# Patient Record
Sex: Male | Born: 1945
Health system: Southern US, Community
[De-identification: ages and names within clinical notes are randomized; demographics above are authoritative.]

## PROBLEM LIST (undated history)

## (undated) DIAGNOSIS — H269 Unspecified cataract: Secondary | ICD-10-CM

## (undated) DIAGNOSIS — E785 Hyperlipidemia, unspecified: Secondary | ICD-10-CM

## (undated) DIAGNOSIS — C61 Malignant neoplasm of prostate: Secondary | ICD-10-CM

## (undated) DIAGNOSIS — E119 Type 2 diabetes mellitus without complications: Secondary | ICD-10-CM

## (undated) DIAGNOSIS — D649 Anemia, unspecified: Secondary | ICD-10-CM

## (undated) DIAGNOSIS — R7989 Other specified abnormal findings of blood chemistry: Secondary | ICD-10-CM

## (undated) DIAGNOSIS — N400 Enlarged prostate without lower urinary tract symptoms: Secondary | ICD-10-CM

## (undated) DIAGNOSIS — I10 Essential (primary) hypertension: Secondary | ICD-10-CM

## (undated) HISTORY — DX: Other specified abnormal findings of blood chemistry: R79.89

## (undated) HISTORY — PX: COLONOSCOPY: SHX174

## (undated) HISTORY — DX: Malignant neoplasm of prostate: C61

## (undated) HISTORY — DX: Hyperlipidemia, unspecified: E78.5

## (undated) HISTORY — DX: Type 2 diabetes mellitus without complications: E11.9

## (undated) HISTORY — DX: Benign prostatic hyperplasia without lower urinary tract symptoms: N40.0

## (undated) HISTORY — DX: Essential (primary) hypertension: I10

## (undated) HISTORY — DX: Anemia, unspecified: D64.9

## (undated) HISTORY — DX: Unspecified cataract: H26.9

---

## 1999-02-06 ENCOUNTER — Inpatient Hospital Stay (HOSPITAL_COMMUNITY): Admission: EM | Admit: 1999-02-06 | Discharge: 1999-02-08 | Payer: Self-pay | Admitting: Emergency Medicine

## 2006-06-30 DIAGNOSIS — C61 Malignant neoplasm of prostate: Secondary | ICD-10-CM

## 2006-06-30 HISTORY — PX: PROSTATE SURGERY: SHX751

## 2006-06-30 HISTORY — DX: Malignant neoplasm of prostate: C61

## 2007-05-04 ENCOUNTER — Ambulatory Visit: Payer: Self-pay | Admitting: Cardiology

## 2007-05-05 ENCOUNTER — Ambulatory Visit: Payer: Self-pay | Admitting: Cardiology

## 2007-05-05 ENCOUNTER — Ambulatory Visit (HOSPITAL_COMMUNITY): Admission: RE | Admit: 2007-05-05 | Discharge: 2007-05-05 | Payer: Self-pay | Admitting: Cardiology

## 2007-05-13 ENCOUNTER — Ambulatory Visit: Payer: Self-pay | Admitting: Cardiology

## 2008-08-15 ENCOUNTER — Ambulatory Visit: Admission: RE | Admit: 2008-08-15 | Discharge: 2008-10-23 | Payer: Self-pay | Admitting: Radiation Oncology

## 2008-10-23 ENCOUNTER — Ambulatory Visit: Admission: RE | Admit: 2008-10-23 | Discharge: 2008-12-05 | Payer: Self-pay | Admitting: Radiation Oncology

## 2008-12-20 ENCOUNTER — Ambulatory Visit: Admission: RE | Admit: 2008-12-20 | Discharge: 2009-02-12 | Payer: Self-pay | Admitting: Radiation Oncology

## 2009-02-19 ENCOUNTER — Ambulatory Visit (HOSPITAL_BASED_OUTPATIENT_CLINIC_OR_DEPARTMENT_OTHER): Admission: RE | Admit: 2009-02-19 | Discharge: 2009-02-19 | Payer: Self-pay | Admitting: Urology

## 2009-03-09 ENCOUNTER — Ambulatory Visit: Admission: RE | Admit: 2009-03-09 | Discharge: 2009-03-27 | Payer: Self-pay | Admitting: Radiation Oncology

## 2010-01-23 ENCOUNTER — Ambulatory Visit: Admission: RE | Admit: 2010-01-23 | Discharge: 2010-01-23 | Payer: Self-pay | Admitting: Radiation Oncology

## 2010-09-14 ENCOUNTER — Encounter: Payer: Self-pay | Admitting: *Deleted

## 2010-09-14 DIAGNOSIS — E119 Type 2 diabetes mellitus without complications: Secondary | ICD-10-CM | POA: Insufficient documentation

## 2010-09-14 DIAGNOSIS — E1169 Type 2 diabetes mellitus with other specified complication: Secondary | ICD-10-CM | POA: Insufficient documentation

## 2010-09-14 DIAGNOSIS — Z720 Tobacco use: Secondary | ICD-10-CM

## 2010-09-14 DIAGNOSIS — I10 Essential (primary) hypertension: Secondary | ICD-10-CM

## 2010-09-14 DIAGNOSIS — E785 Hyperlipidemia, unspecified: Secondary | ICD-10-CM

## 2010-09-14 DIAGNOSIS — E1165 Type 2 diabetes mellitus with hyperglycemia: Secondary | ICD-10-CM | POA: Insufficient documentation

## 2010-09-14 DIAGNOSIS — N400 Enlarged prostate without lower urinary tract symptoms: Secondary | ICD-10-CM

## 2010-10-05 LAB — COMPREHENSIVE METABOLIC PANEL
ALT: 31 U/L (ref 0–53)
AST: 37 U/L (ref 0–37)
Albumin: 3.4 g/dL — ABNORMAL LOW (ref 3.5–5.2)
Alkaline Phosphatase: 73 U/L (ref 39–117)
CO2: 29 mEq/L (ref 19–32)
Chloride: 107 mEq/L (ref 96–112)
GFR calc Af Amer: >60 mL/min (ref >60)
Potassium: 4.5 mEq/L (ref 3.5–5.1)
Sodium: 141 mEq/L (ref 135–145)
Total Bilirubin: 0.6 mg/dL (ref 0.3–1.2)

## 2010-10-05 LAB — CBC
Platelets: 193 10*3/uL (ref 150–400)
RBC: 3.72 MIL/uL — ABNORMAL LOW (ref 4.22–5.81)
WBC: 5.1 10*3/uL (ref 4.0–10.5)

## 2010-10-05 LAB — POCT I-STAT 4, (NA,K, GLUC, HGB,HCT)
Glucose, Bld: 104 mg/dL — ABNORMAL HIGH (ref 70–99)
HCT: 36 % — ABNORMAL LOW (ref 39.0–52.0)
Hemoglobin: 12.2 g/dL — ABNORMAL LOW (ref 13.0–17.0)

## 2010-10-05 LAB — APTT: aPTT: 27 seconds (ref 24–37)

## 2010-11-12 NOTE — Letter (Signed)
May 04, 2007    Lindaann Pascal, PA-C  [facility]  [address]  [place], [place] [address]   RE:  Andrew [name], Andrew [name]  MRN:  [id number]  /  DOB:  [birth year]   Dear Lorin Picket:   It was my pleasure evaluating this patient in consultation today in the  office at your request.  As you know, this nice gentleman has had a 10-  year history of hypertension that has apparently been fairly well  treated.  He has a longstanding history of cigarette smoking with total  consumption of perhaps 60-pack-years.  Otherwise, he has been generally  healthy.  He is unaware of his cholesterol status.  He has not had  diabetes.  He has only been hospitalized once for back pain, and did not  require surgery.   ALLERGIES:  No known drug allergies.   CURRENT MEDICATIONS:  Diovan HCT 160/25 mg daily.   SOCIAL HISTORY:  Disabled, but enjoys gardening.  Married and lives  locally with no children.   FAMILY HISTORY:  Father died at age 21 with chronic lung disease; mother  at age 75 with CVA.  He has five siblings, one deceased due to CVA and  one due to chronic lung disease.   REVIEW OF SYSTEMS:  Notable for the need for corrective lenses for  reading, some impairment in hearing, partial upper dentures, chronic  left shoulder pain, continuing back and right hip pain with disability.  All other systems reviewed and are negative.   PHYSICAL EXAMINATION:  GENERAL:  Pleasant gentleman in no acute  distress.  VITAL SIGNS:  Weight 217, blood pressure 145/95, heart rate 105 and  regular, respirations 16.  HEENT:  EOM's full.  Normal lids and conjunctivae.  Normal oral mucosa.  NECK:  No jugular venous distention.  Normal carotid upstrokes without  bruits.  LUNGS:  Clear.  HEART:  Normal first and second heart sounds; modest systolic ejection  murmur at the left sternal border and base.  Normal PMI.  ABDOMEN:  Soft and nontender.  No bruits.  Normal bowel sounds.  No  masses nor  organomegaly.  EXTREMITIES:  Normal distal pulses; no edema.  NEUROMUSCULAR:  Symmetric strength and tone.  Normal cranial nerves.  PSYCHIATRIC:  Alert and oriented.  Normal affect.  SKIN:  No significant lesions.   EKG; sinus tachycardia, left atrial abnormality, probable LVH.  Slight  inferior J-point elevation.  No previous tracing for comparison.   IMPRESSION:  The patient enjoys generally good health.  He does have  some cardiovascular risk factors, but no symptoms to suggest the  presence of CAD.  We will check a lipid profile to determine if  pharmacologic therapy is warranted.   Blood pressure control is suboptimal at this visit, but the patient  seems somewhat anxious with a substantial tachycardia.  He will obtain  home blood pressures to determine if additional therapy is warranted.   He has modest systolic ejection murmur.  This probably is a flow murmur.  Due to the presence of LVH on his EKG and the murmur, an echocardiogram  will be obtained.   The patient has a longstanding history of tobacco use without much in  the way of symptoms.  I recommended that he seriously consider  discontinuing tobacco use.  I will reassess this nice gentleman after  the above testing has been completed.  Thanks so much for sending him to  me.    Sincerely,  Gerrit Friends. Dietrich Pates, MD, [facility]  Electronically Signed    RMR/MedQ  DD: 05/04/2007  DT: 05/05/2007  Job #: [id number]

## 2010-11-12 NOTE — Assessment & Plan Note (Signed)
[facility]                       [place] CARDIOLOGY OFFICE NOTE   Andrew [name], Andrew [name]                         MRN:          [id number]  DATE:05/13/2007                            DOB:          Nov 14, 1945    [name] returns to the office for continued assessment and treatment  of hypertension, heart murmur, and tobacco use.  Since his last visit,  he has tapered cigarette use to some extent, but is still smoking nearly  1 pack per day.  He does not appear motivated to quit entirely at the  present time.  An echocardiogram was performed due to his sytolic murmur  and hypertension.  This showed no valvular abnormalities and very mild  left ventricular hypertrophy.  He monitored blood pressure at home.  Approximately 70% of his values are too high on the systolic side.  His  only medication continues to be Diovan/hydrochlorothiazide.   PHYSICAL EXAMINATION:  On exam, a pleasant gentleman in no acute  distress walking with a cane due to chronic back and right hip problems.  The weight is 219, two pounds more than at his last visit.  Blood  pressure 135/80, heart rate 90 and regular, respirations 16.  NECK:  No jugular venous distension, no carotid bruits.  LUNGS:  Clear.  CARDIAC:  Normal 1st and 2nd heart sounds, 4th heart sound present.  ABDOMEN:  Soft and nontender, no organomegaly.  EXTREMITIES:  No edema.   IMPRESSION:  [name] is doing generally well.  He requires additional  antihypertensive therapy.  We will start metoprolol on a dose of 25 mg  b.i.d.  He will continue to monitor pressures at home and return to your  office.  I will see him again in 6 months.  I offered him a prescription  for pharmacologic therapy whenever he decides that he is serious about  discontinuing tobacco use.     Gerrit Friends. Dietrich Pates, MD, [facility]  Electronically Signed    RMR/MedQ  DD: 05/13/2007  DT: 05/14/2007  Job #: [id number]

## 2010-11-12 NOTE — Op Note (Signed)
NAME:  Andrew [name], Andrew [name]                  ACCOUNT NO.:  [id number]   MEDICAL RECORD NO.:  [id number]          PATIENT TYPE:  AMB   LOCATION:  [facility]                         FACILITY:  [facility]   PHYSICIAN:  Lindaann Slough, M.D.  DATE OF BIRTH:  [birth year]   DATE OF PROCEDURE:  02/19/2009  DATE OF DISCHARGE:                               OPERATIVE REPORT   PREOPERATIVE DIAGNOSIS:  Adenocarcinoma of prostate, stage T2b   POSTOPERATIVE DIAGNOSIS:  Adenocarcinoma of prostate, stage T2b   PROCEDURE:  I-125 seeds implantation.  Cystoscopy.   SURGEON:  Danae Chen, M.D., Maryln Gottron, M.D.   ANESTHESIA:  General.   INDICATIONS:  The patient is a 65 years old male who was diagnosed by  Dr. Jerre Simon to have prostate cancer, Gleason score 7 and PSA of 16.6.  He  was then seen by Dr. Dayton Scrape and was advised to have combination  radiation treatment.  He is also on neoadjuvant hormonal therapy.  He  has already received external beam.  He is scheduled today for  brachytherapy.   The patient was identified by his wristband and proper time-out was  taken.   DESCRIPTION OF PROCEDURE:  Under general anesthesia, he was prepped and  draped and placed in the dorsal lithotomy position.  Ultrasound planning  was done by Dr. Dayton Scrape and when the planning was completed under  ultrasound guidance and with the Nucletron, a total of 61 seeds were  implanted in the prostate through 26 needles.  There appears to be good  seed distribution on  fluoroscopy.  The total apparent activity 17.6400  mCi.   The Foley catheter that was inserted in the bladder was then removed.  A  flexible cystoscope was passed in the bladder.  The urethra is normal.  One seed appeared to be close to the urethral mucosa but is not free in  the in the prostatic urethra.  There was one seed in the bladder.  The  bladder mucosa is normal.  There is no stone or tumor in the bladder.  The ureteral orifices are in normal position and shape with  clear  efflux.  The ssed in the bladder was  removed with the grasping forceps.  60 seeds were left in the prostate.  The cystoscope was then removed.  A #16 Foley catheter was then  reinserted in the bladder.   The patient tolerated the procedure well and left the OR in satisfactory  condition to post anesthesia care unit.      Lindaann Slough, M.D.  Electronically Signed     MN/MEDQ  D:  02/19/2009  T:  02/19/2009  Job:  [id number]   cc:   Maryln Gottron, M.D.  Fax: [phone]   Ky Barban, M.D.  Fax: [phone]

## 2010-11-12 NOTE — Procedures (Signed)
NAMEERASTUS, BARTOLOMEI                  ACCOUNT NO.:  [id number]   MEDICAL RECORD NO.:  [id number]          PATIENT TYPE:  OUT   LOCATION:  [facility]                           FACILITY:  [facility]   PHYSICIAN:  Gerrit Friends. Dietrich Pates, MD, FACCDATE OF BIRTH:  Mar 18, 1946   DATE OF PROCEDURE:  05/05/2007  DATE OF DISCHARGE:                                ECHOCARDIOGRAM   REFERRING:  Gerrit Friends. Dietrich Pates, M.D.   CLINICAL DATA:  A 65 year old gentleman with heart murmur and  hypertension.   M-MODE:  Aorta 3.6, left atrium 3.5, septum 1.4, posterior wall 1.4, LV  diastole 3.7, LV systole 2.9.   1. Technically adequate echocardiographic study.  2. Normal left atrium, right atrium and right ventricle.  3. Normal aortic, mitral, tricuspid and pulmonic valves; mild mitral      annular calcification; physiologic tricuspid regurgitation.  4. Normal proximal pulmonary artery.  5. Normal proximal ascending aorta; mild calcification of the wall.  6. Normal left ventricular size; borderline hypertrophy; normal      regional and global LV systolic function.  7. Normal IVC.      Gerrit Friends. Dietrich Pates, MD, [facility]  Electronically Signed     RMR/MEDQ  D:  05/05/2007  T:  05/06/2007  Job:  [id number]

## 2012-09-20 ENCOUNTER — Telehealth: Payer: Self-pay | Admitting: Physician Assistant

## 2012-09-20 MED ORDER — SITAGLIPTIN PHOS-METFORMIN HCL 50-500 MG PO TABS: 1.0000 | ORAL_TABLET | Freq: Two times a day (BID) | ORAL | Status: DC

## 2012-09-20 NOTE — Telephone Encounter (Signed)
Jaumet  .50mg /500mg  Samples if we have them  Call please

## 2012-09-20 NOTE — Telephone Encounter (Signed)
Pt.notified

## 2012-10-19 ENCOUNTER — Telehealth: Payer: Self-pay | Admitting: Nurse Practitioner

## 2012-10-19 NOTE — Telephone Encounter (Signed)
Pt aware we are out of samples. 

## 2012-10-25 ENCOUNTER — Telehealth: Payer: Self-pay | Admitting: Nurse Practitioner

## 2012-10-25 NOTE — Telephone Encounter (Signed)
Patient aware.

## 2012-10-25 NOTE — Telephone Encounter (Signed)
OK for Janumet samples

## 2012-10-25 NOTE — Telephone Encounter (Signed)
Please advise 

## 2012-10-25 NOTE — Telephone Encounter (Signed)
No samples 

## 2012-10-28 ENCOUNTER — Telehealth: Payer: Self-pay | Admitting: Physician Assistant

## 2012-10-28 DIAGNOSIS — E119 Type 2 diabetes mellitus without complications: Secondary | ICD-10-CM

## 2012-10-28 MED ORDER — SITAGLIPTIN PHOS-METFORMIN HCL 50-500 MG PO TABS: 1.0000 | ORAL_TABLET | Freq: Two times a day (BID) | ORAL | Status: DC

## 2012-10-28 NOTE — Telephone Encounter (Signed)
Pt aware samples at front desk 

## 2012-11-16 ENCOUNTER — Ambulatory Visit (INDEPENDENT_AMBULATORY_CARE_PROVIDER_SITE_OTHER): Payer: Medicare Other

## 2012-11-16 ENCOUNTER — Encounter: Payer: Self-pay | Admitting: Family Medicine

## 2012-11-16 ENCOUNTER — Ambulatory Visit (INDEPENDENT_AMBULATORY_CARE_PROVIDER_SITE_OTHER): Payer: Medicare Other | Admitting: Family Medicine

## 2012-11-16 VITALS — BP 141/74 | HR 65 | Temp 97.1°F | Ht 67.0 in | Wt 232.8 lb

## 2012-11-16 DIAGNOSIS — F17200 Nicotine dependence, unspecified, uncomplicated: Secondary | ICD-10-CM

## 2012-11-16 DIAGNOSIS — R350 Frequency of micturition: Secondary | ICD-10-CM

## 2012-11-16 DIAGNOSIS — Z720 Tobacco use: Secondary | ICD-10-CM

## 2012-11-16 DIAGNOSIS — R5383 Other fatigue: Secondary | ICD-10-CM

## 2012-11-16 DIAGNOSIS — E119 Type 2 diabetes mellitus without complications: Secondary | ICD-10-CM

## 2012-11-16 DIAGNOSIS — E785 Hyperlipidemia, unspecified: Secondary | ICD-10-CM

## 2012-11-16 DIAGNOSIS — R5381 Other malaise: Secondary | ICD-10-CM

## 2012-11-16 DIAGNOSIS — I10 Essential (primary) hypertension: Secondary | ICD-10-CM

## 2012-11-16 DIAGNOSIS — R0602 Shortness of breath: Secondary | ICD-10-CM

## 2012-11-16 DIAGNOSIS — N400 Enlarged prostate without lower urinary tract symptoms: Secondary | ICD-10-CM

## 2012-11-16 DIAGNOSIS — Z23 Encounter for immunization: Secondary | ICD-10-CM

## 2012-11-16 DIAGNOSIS — E559 Vitamin D deficiency, unspecified: Secondary | ICD-10-CM

## 2012-11-16 NOTE — Progress Notes (Signed)
Subjective:    Patient ID: Andrew [name], male    DOB: [birth year], 67 y.o.   MRN: [id number]  HPI This patient presents for recheck of multiple medical problems. No one accompanies the patient today.  Patient Active Problem List   Diagnosis Date Noted  . Diabetes mellitus 09/14/2010  . BPH (benign prostatic hyperplasia) 09/14/2010  . HTN (hypertension) 09/14/2010  . Hyperlipidemia 09/14/2010  . Tobacco abuse 09/14/2010    In addition, he is having some allergic rhinitis issues urinary frequency and back pain. Patient is retired from working in the [employer]. He stays at home a lot and works in the yard. He brings in blood sugars today and fasting blood sugars seem to run in the 140 range and before dinner average blood sugars seem to be in the 140 range. He does not do home blood pressure. He indicates he has no burning with voiding. The patient sees a urologist in [place] about every 6 month. He sees the ophthalmologist yearly and was last checked about a year ago. See outside blood sugars for review.  The allergies, current medications, past medical history, surgical history, family and social history are reviewed.  Immunizations reviewed.  Health maintenance reviewed.  The following items are outstanding: Multiple items are past due and we will start working on these today.      Review of Systems  Constitutional: Positive for fatigue.  HENT: Positive for postnasal drip.   Eyes: Negative.   Respiratory: Negative.   Cardiovascular: Negative.   Gastrointestinal: Negative.   Genitourinary: Positive for frequency.  Musculoskeletal: Positive for back pain (LBP).  Skin: Negative.   Allergic/Immunologic: Positive for environmental allergies (seasonal).  Neurological: Negative.   Psychiatric/Behavioral: Positive for sleep disturbance (occasional).       Objective:   Physical Exam BP 141/74  Pulse 65  Temp(Src) 97.1 F (36.2 C) (Oral)  Ht 5\' 7"  (1.702 m)  Wt 232  lb 12.8 oz (105.597 kg)  BMI 36.45 kg/m2  The patient appeared well nourished and normally developed, alert and oriented to time and place. He was pleasant and laid back. Speech, behavior and judgement appear normal. Vital signs as documented.  Head exam is unremarkable. No scleral icterus or pallor noted. TMs were normal throat was normal.  Neck is without jugular venous distension, thyromegally, or carotid bruits. Carotid upstrokes are brisk bilaterally. No cervical adenopathy. Lungs are clear anteriorly and posteriorly to auscultation. Normal respiratory effort. Cardiac exam reveals regular rate and rhythm at 72 per minute. First and second heart sounds normal.  No murmurs, rubs or gallops.  Abdominal exam reveals normal bowl sounds, obesity, no masses, no organomegaly and no aortic enlargement. No inguinal adenopathy. Extremities are nonedematous and both femoral and pedal pulses are normal. Skin without pallor or jaundice.  Warm and dry, without rash. Neurologic exam reveals normal deep tendon reflexes and normal sensation. A. diabetic foot exam was done.  [facility] reading (PRIMARY) by  Dr. Christell Constant: CXR: Bronchitic changes                                        Assessment & Plan:  1. Diabetes - POCT glycosylated hemoglobin (Hb A1C); Standing - POCT UA - Microalbumin; Standing - BASIC METABOLIC PANEL WITH GFR; Standing - Microalbumin, urine; Standing  2. Hypertension - BASIC METABOLIC PANEL WITH GFR; Standing  3. Hyperlipidemia - Hepatic function panel; Standing - NMR Lipoprofile  with Lipids; Standing  4. Fatigue - POCT CBC; Standing - Thyroid Panel With TSH  5. BPH (benign prostatic hypertrophy) He is followed every 6 months by a urologist  6. Vitamin D deficiency - Vitamin D 25 hydroxy; Standing  7. Tobacco abuse - DG Chest 2 View; Future  8. Urinary frequency - POCT UA - Microscopic Only - POCT urinalysis dipstick  9. Shortness of breath -CXR  Patient  Instructions  Return to clinic for lab work For now continue current medication Continue therapeutic lifestyle changes

## 2012-11-16 NOTE — Patient Instructions (Addendum)
Return to clinic for lab work For now continue current medication Continue therapeutic lifestyle changes

## 2012-11-18 ENCOUNTER — Ambulatory Visit (INDEPENDENT_AMBULATORY_CARE_PROVIDER_SITE_OTHER): Payer: Medicare Other | Admitting: Family Medicine

## 2012-11-18 DIAGNOSIS — E785 Hyperlipidemia, unspecified: Secondary | ICD-10-CM

## 2012-11-18 DIAGNOSIS — E559 Vitamin D deficiency, unspecified: Secondary | ICD-10-CM

## 2012-11-18 DIAGNOSIS — E119 Type 2 diabetes mellitus without complications: Secondary | ICD-10-CM

## 2012-11-18 DIAGNOSIS — I10 Essential (primary) hypertension: Secondary | ICD-10-CM

## 2012-11-18 DIAGNOSIS — R5383 Other fatigue: Secondary | ICD-10-CM

## 2012-11-18 LAB — POCT CBC
Granulocyte percent: 62 %G (ref 37–80)
HCT, POC: 36.5 % — AB (ref 43.5–53.7)
MCH, POC: 31.1 pg (ref 27–31.2)
MCV: 90.2 fL (ref 80–97)
RDW, POC: 13.5 %
WBC: 5.6 10*3/uL (ref 4.6–10.2)

## 2012-11-18 LAB — HEPATIC FUNCTION PANEL
AST: 45 U/L — ABNORMAL HIGH (ref 0–37)
Alkaline Phosphatase: 86 U/L (ref 39–117)
Bilirubin, Direct: 0.1 mg/dL (ref 0.0–0.3)
Total Bilirubin: 0.4 mg/dL (ref 0.3–1.2)

## 2012-11-18 LAB — BASIC METABOLIC PANEL WITH GFR
CO2: 27 mEq/L (ref 19–32)
Chloride: 104 mEq/L (ref 96–112)
Creat: 1.16 mg/dL (ref 0.50–1.35)
Sodium: 141 mEq/L (ref 135–145)

## 2012-11-18 LAB — POCT UA - MICROALBUMIN: Microalbumin Ur, POC: POSITIVE mg/dL

## 2012-11-19 LAB — MICROALBUMIN, URINE: Microalb, Ur: 0.73 mg/dL (ref 0.00–1.89)

## 2012-11-23 LAB — NMR LIPOPROFILE WITH LIPIDS
HDL Particle Number: 20.2 umol/L — ABNORMAL LOW (ref >=30.5)
HDL-C: 27 mg/dL — ABNORMAL LOW (ref >=40)
Large HDL-P: <1.3 umol/L — ABNORMAL LOW (ref >=4.8)
Triglycerides: 161 mg/dL — ABNORMAL HIGH (ref <150)

## 2012-12-07 ENCOUNTER — Telehealth: Payer: Self-pay | Admitting: Family Medicine

## 2012-12-07 NOTE — Telephone Encounter (Signed)
appt scheduled with [facility] Pt notified

## 2012-12-17 NOTE — Progress Notes (Signed)
Patient ID: Andrew [name], male   DOB: 13-Sep-1945, 67 y.o.   MRN: [id number] Error lab only

## 2013-01-03 ENCOUNTER — Encounter: Payer: Self-pay | Admitting: Pharmacist

## 2013-01-03 ENCOUNTER — Ambulatory Visit (INDEPENDENT_AMBULATORY_CARE_PROVIDER_SITE_OTHER): Payer: Medicare Other | Admitting: Pharmacist

## 2013-01-03 VITALS — BP 152/82 | HR 70 | Ht 67.0 in | Wt 234.0 lb

## 2013-01-03 DIAGNOSIS — F17200 Nicotine dependence, unspecified, uncomplicated: Secondary | ICD-10-CM

## 2013-01-03 DIAGNOSIS — Z720 Tobacco use: Secondary | ICD-10-CM

## 2013-01-03 DIAGNOSIS — I10 Essential (primary) hypertension: Secondary | ICD-10-CM

## 2013-01-03 DIAGNOSIS — E119 Type 2 diabetes mellitus without complications: Secondary | ICD-10-CM

## 2013-01-03 DIAGNOSIS — E785 Hyperlipidemia, unspecified: Secondary | ICD-10-CM

## 2013-01-03 MED ORDER — ATORVASTATIN CALCIUM 40 MG PO TABS: 40.0000 mg | ORAL_TABLET | Freq: Every day | ORAL | Status: DC

## 2013-01-03 MED ORDER — LISINOPRIL-HYDROCHLOROTHIAZIDE 20-25 MG PO TABS: 1.0000 | ORAL_TABLET | Freq: Every day | ORAL | Status: DC

## 2013-01-03 MED ORDER — VARENICLINE TARTRATE 0.5 MG X 11 & 1 MG X 42 PO MISC: ORAL | Status: DC

## 2013-01-03 NOTE — Progress Notes (Addendum)
Diabetes Follow-Up Visit Chief Complaint:   Chief Complaint  Patient presents with  . Diabetes  . Hypertension  . Hyperlipidemia     Filed Vitals:   01/03/13 1308  BP: 152/82  Pulse: 70    HPI:  Diabetes  - currently taking Janumet 50/500mg  1 tablet bid with good results.  Last A1C was 7.0%.  HGB readings in 90 to 120 range.   Hyperlipidemia - patient currently taking pravastatin 40mg  daily.  Last LDL-P was 1656 and Tg 161.  No following any special diet for lipid or BG control  Hypertension - patient has been out of diovan HCT 160/12.5mg  for 2 to 3 weeks because of cost.  BP today was elevated.  Tobacco abuse - patient smokes about 1ppd.  Interested in quitting.  Triggers include - morning coffee and after meals.  Low HBG - last CBC showed slightly low HGB.  Patient has been given FOBT but said he put in the cabinet and forgot about it.    Exam  Polyuria:  negative  Polydipsia:  negative Polyphagia:  negative  BMI:  Body mass index is 36.64 kg/(m^2).    General Appearance:  obese Mood/Affect:  normal   Low fat/carbohydrate diet?  No Nicotine Abuse?  Yes Medication Compliance?  No Exercise?  No Alcohol Abuse?  No    Lab Results  Component Value Date   HGBA1C 7.0% 11/18/2012    Lab Results  Component Value Date   MICROALBUR 0.73 11/18/2012    Lab Results  Component Value Date   LDLCALC 85 11/18/2012   TRIG 161* 11/18/2012    LDL-P was 1656 (elevated)   Assessment:  1.  Diabetes.  controlled 2.  Blood Pressure.  Uncontrolled - complicated by cost of medication/poor compliance 3.  Lipids.  uncontrolled 4.  Dental Care.  No dental insurance - has partial plate 5.  Eye Care/Exam.  Last exam over 1 year ago  Recommendations: 1.  Medication recommendations at this time are as follows:    Change diovan HCT to lisinopril HCT 20/25mg  1 tablet by mouth each morning  Change pravastatin to atorvastatin 40mg  1 tablet daily  Start chantix starter pack for smoking  cessation - discussed with wife and patient side effects for monitor for (especially any changes in mood/mental status) 2.  Reviewed HBG goals:  Fasting 80-130 and 1-2 hour post prandial <180.  Patient is instructed to check BG 1 times per day.    3.  BP goal < 140/80. 4.  LDL goal of < 100, HDL > 40 and TG < 150. 5.  Eye Exam yearly and Dental Exam every 6 months. 6.  Dietary recommendations:  Discussed limiting high CHO containing foods - breads, pasta, potatoes and processed foods.   Increase lean proteins, fresh fruits and non starchy vegetables 7.  Physical Activity recommendations:  Encouraged to move more 8.  Identified several smoking triggers and came up with alternative behaviors with patient to either avoid or deal with triggers.   9.  Return to clinic in 4-6 wks   Time spent counseling patient:  45 minutes  Referring provider:  Lady Deutscher, PharmD, CPP       Also reminded patient about the importance of FOBT.  He will complete in the next week and return to office

## 2013-01-06 ENCOUNTER — Other Ambulatory Visit (INDEPENDENT_AMBULATORY_CARE_PROVIDER_SITE_OTHER): Payer: Medicare Other

## 2013-01-06 DIAGNOSIS — Z1212 Encounter for screening for malignant neoplasm of rectum: Secondary | ICD-10-CM

## 2013-01-07 LAB — FECAL OCCULT BLOOD, IMMUNOCHEMICAL: Fecal Occult Blood: NEGATIVE

## 2013-01-13 ENCOUNTER — Telehealth: Payer: Self-pay | Admitting: Family Medicine

## 2013-01-13 NOTE — Telephone Encounter (Signed)
Samples up front patient aware  

## 2013-02-16 ENCOUNTER — Ambulatory Visit (INDEPENDENT_AMBULATORY_CARE_PROVIDER_SITE_OTHER): Payer: Medicare Other | Admitting: Family Medicine

## 2013-02-16 ENCOUNTER — Encounter: Payer: Self-pay | Admitting: Family Medicine

## 2013-02-16 VITALS — BP 123/69 | HR 69 | Temp 97.7°F | Ht 67.0 in | Wt 231.2 lb

## 2013-02-16 DIAGNOSIS — E119 Type 2 diabetes mellitus without complications: Secondary | ICD-10-CM

## 2013-02-16 DIAGNOSIS — F17200 Nicotine dependence, unspecified, uncomplicated: Secondary | ICD-10-CM

## 2013-02-16 DIAGNOSIS — N400 Enlarged prostate without lower urinary tract symptoms: Secondary | ICD-10-CM

## 2013-02-16 DIAGNOSIS — R350 Frequency of micturition: Secondary | ICD-10-CM

## 2013-02-16 DIAGNOSIS — E785 Hyperlipidemia, unspecified: Secondary | ICD-10-CM

## 2013-02-16 DIAGNOSIS — I10 Essential (primary) hypertension: Secondary | ICD-10-CM

## 2013-02-16 DIAGNOSIS — Z720 Tobacco use: Secondary | ICD-10-CM

## 2013-02-16 LAB — POCT UA - MICROALBUMIN: Microalbumin Ur, POC: POSITIVE mg/L

## 2013-02-16 NOTE — Patient Instructions (Addendum)
Fall precautions discussed Continue current meds and therapeutic lifestyle changes Return to clinic in September or October for a flu shot Schedule routine eye exam Reduce and stop smoking Watch sodium intake Please make an appointment with a podiatrist to work on feet

## 2013-02-16 NOTE — Progress Notes (Signed)
  Subjective:    Patient ID: Andrew [name], male    DOB: 1945/11/15, 67 y.o.   MRN: [id number]  HPI Patient returns to clinic today for followup and management of chronic medical problems. These include diabetes mellitus type 2, hyperlipidemia, and hypertension. He also sees Dr. Jerre Simon for his BPH. Patient brings in his home blood sugar readings for review and these will be scanned into the record. The patient is still smoking and admits that he does not exercise as much as possible or watch his diet as closely as possible.   Review of Systems  Constitutional: Positive for fatigue (some).  HENT: Positive for sneezing (intermitent) and postnasal drip (slight). Negative for ear pain, congestion, sore throat and sinus pressure.   Eyes: Negative.  Negative for pain, discharge, redness, itching and visual disturbance.  Respiratory: Positive for cough (occasional). Negative for choking, chest tightness and wheezing. Shortness of breath: with exertion.   Cardiovascular: Negative.  Negative for chest pain, palpitations and leg swelling.  Gastrointestinal: Negative for nausea, vomiting, abdominal pain, diarrhea, constipation and blood in stool.  Endocrine: Negative.  Negative for cold intolerance, heat intolerance, polydipsia, polyphagia and polyuria.  Genitourinary: Positive for frequency. Negative for dysuria, hematuria and testicular pain.  Musculoskeletal: Positive for back pain (LBP). Negative for arthralgias.  Skin: Negative.  Negative for color change, pallor, rash and wound.  Allergic/Immunologic: Positive for environmental allergies.  Neurological: Negative for dizziness, tremors, light-headedness, numbness and headaches.  Hematological: Negative.   Psychiatric/Behavioral: Positive for sleep disturbance (occasional). Negative for confusion and agitation. The patient is not nervous/anxious.        Objective:   Physical Exam BP 123/69  Pulse 69  Temp(Src) 97.7 F (36.5 C) (Oral)  Ht 5\' 7"   (1.702 m)  Wt 231 lb 3.2 oz (104.872 kg)  BMI 36.2 kg/m2  The patient appeared well nourished but overweight and normally developed, alert and oriented to time and place. Speech, behavior and judgement appear normal. Vital signs as documented.  Head exam is unremarkable. No scleral icterus or pallor noted. There is some nasal congestion bilaterally. Mouth throat ear canals and eardrums were normal. There was minimal cerumen bilaterally.  Neck is without jugular venous distension, thyromegally, or carotid bruits. Carotid upstrokes are brisk bilaterally. No cervical adenopathy. Lungs are clear anteriorly and posteriorly to auscultation. Normal respiratory effort. Cardiac exam reveals regular rate and rhythm at 60 per minute. First and second heart sounds normal.  No murmurs, rubs or gallops.  Abdominal exam reveals obesity, normal bowl sounds, no masses, no organomegaly and no aortic enlargement. No inguinal adenopathy. There is no abdominal tenderness. Extremities are nonedematous and both femoral and pedal pulses are normal. Skin without pallor or jaundice.  Warm and dry, without rash. Neurologic exam reveals normal deep tendon reflexes and normal sensation. Diabetic foot exam was done          Assessment & Plan:  Frequency of urination  Tobacco abuse  Hyperlipidemia  HTN (hypertension)  Diabetes mellitus type II controlled  BPH (benign prostatic hyperplasia)  Patient Instructions  Fall precautions discussed Continue current meds and therapeutic lifestyle changes Return to clinic in September or October for a flu shot Schedule routine eye exam Reduce and stop smoking Watch sodium intake Please make an appointment with a podiatrist to work on feet   Nyra Capes MD

## 2013-02-17 LAB — MICROALBUMIN, URINE: Microalb, Ur: 0.5 mg/dL (ref 0.00–1.89)

## 2013-04-18 ENCOUNTER — Telehealth: Payer: Self-pay | Admitting: Family Medicine

## 2013-04-18 ENCOUNTER — Ambulatory Visit (INDEPENDENT_AMBULATORY_CARE_PROVIDER_SITE_OTHER): Payer: Medicare Other

## 2013-04-18 DIAGNOSIS — Z23 Encounter for immunization: Secondary | ICD-10-CM

## 2013-04-18 NOTE — Telephone Encounter (Signed)
Samples up front 

## 2013-05-17 ENCOUNTER — Telehealth: Payer: Self-pay | Admitting: Family Medicine

## 2013-05-17 NOTE — Telephone Encounter (Signed)
Samples up front patient aware  

## 2013-06-21 ENCOUNTER — Encounter: Payer: Self-pay | Admitting: Family Medicine

## 2013-06-21 ENCOUNTER — Ambulatory Visit (INDEPENDENT_AMBULATORY_CARE_PROVIDER_SITE_OTHER): Payer: Medicare Other | Admitting: Family Medicine

## 2013-06-21 VITALS — BP 150/80 | HR 75 | Temp 97.7°F | Ht 67.0 in | Wt 236.0 lb

## 2013-06-21 DIAGNOSIS — R350 Frequency of micturition: Secondary | ICD-10-CM

## 2013-06-21 DIAGNOSIS — N400 Enlarged prostate without lower urinary tract symptoms: Secondary | ICD-10-CM

## 2013-06-21 DIAGNOSIS — E559 Vitamin D deficiency, unspecified: Secondary | ICD-10-CM

## 2013-06-21 DIAGNOSIS — E119 Type 2 diabetes mellitus without complications: Secondary | ICD-10-CM

## 2013-06-21 DIAGNOSIS — Z1211 Encounter for screening for malignant neoplasm of colon: Secondary | ICD-10-CM

## 2013-06-21 DIAGNOSIS — E785 Hyperlipidemia, unspecified: Secondary | ICD-10-CM

## 2013-06-21 DIAGNOSIS — I10 Essential (primary) hypertension: Secondary | ICD-10-CM

## 2013-06-21 LAB — POCT CBC
HCT, POC: 40.7 % — AB (ref 43.5–53.7)
Lymph, poc: 2.9 (ref 0.6–3.4)
MCH, POC: 28.9 pg (ref 27–31.2)
MCHC: 31.7 g/dL — AB (ref 31.8–35.4)
MCV: 91.1 fL (ref 80–97)
Platelet Count, POC: 183 10*3/uL (ref 142–424)
RDW, POC: 13.6 %
WBC: 7.4 10*3/uL (ref 4.6–10.2)

## 2013-06-21 LAB — POCT URINALYSIS DIPSTICK
Glucose, UA: NEGATIVE
Ketones, UA: NEGATIVE
Leukocytes, UA: NEGATIVE
Protein, UA: NEGATIVE
Urobilinogen, UA: NEGATIVE

## 2013-06-21 LAB — POCT UA - MICROSCOPIC ONLY
Crystals, Ur, HPF, POC: NEGATIVE
RBC, urine, microscopic: NEGATIVE

## 2013-06-21 NOTE — Patient Instructions (Addendum)
Continue current medications. Continue good therapeutic lifestyle changes which include good diet and exercise. Fall precautions discussed with patient. Schedule your flu vaccine if you haven't had it yet If you are over 67 years old - you may need Prevnar 13 or the adult Pneumonia vaccine. Continue to watch diet closely and try to drink more water and get as much exercise as possible The patient must get an eye exam before the next visit We will schedule you for a colonoscopy because you have never had one and your brother has had colon polyps You can get the Prevnar vaccine in May or June of this year

## 2013-06-21 NOTE — Addendum Note (Signed)
Addended by: Magdalene River on: 06/21/2013 11:14 AM   Modules accepted: Orders

## 2013-06-21 NOTE — Addendum Note (Signed)
Addended by: Magdalene River on: 06/21/2013 11:19 AM   Modules accepted: Orders

## 2013-06-21 NOTE — Progress Notes (Signed)
Subjective:    Patient ID: Andrew [name], male    DOB: 30-May-1946, 67 y.o.   MRN: [id number]  HPI Pt here for follow up and management of chronic medical problems.  Patient is in need of a colonoscopy and an eye exam. He can not get a Prevnar until May as this will be one year after the Pneumovax was given. The patient does complain of increased urinary frequency. Patient indicates that his home blood sugars have been running anywhere from 150 down to about 100 and this includes fasting and after eating.      Patient Active Problem List   Diagnosis Date Noted  . Diabetes mellitus type II controlled 09/14/2010  . BPH (benign prostatic hyperplasia) 09/14/2010  . HTN (hypertension) 09/14/2010  . Hyperlipidemia 09/14/2010  . Tobacco abuse 09/14/2010   Outpatient Encounter Prescriptions as of 06/21/2013  Medication Sig  . aspirin 81 MG tablet Take 81 mg by mouth daily.  Marland Kitchen atorvastatin (LIPITOR) 40 MG tablet Take 1 tablet (40 mg total) by mouth daily.  . cholecalciferol (VITAMIN D) 1000 UNITS tablet Take 1,000 Units by mouth daily.  Marland Kitchen doxazosin (CARDURA) 4 MG tablet Take 4 mg by mouth at bedtime.    Marland Kitchen lisinopril-hydrochlorothiazide (PRINZIDE,ZESTORETIC) 20-25 MG per tablet Take 1 tablet by mouth daily.  . sitaGLIPtan-metformin (JANUMET) 50-500 MG per tablet Take 1 tablet by mouth 2 (two) times daily with a meal.    Review of Systems  Constitutional: Negative.   HENT: Negative.   Eyes: Negative.   Respiratory: Negative.   Cardiovascular: Negative.   Gastrointestinal: Negative.   Endocrine: Negative.   Genitourinary: Negative.   Musculoskeletal: Negative.   Skin: Negative.   Allergic/Immunologic: Negative.   Neurological: Negative.   Hematological: Negative.   Psychiatric/Behavioral: Negative.        Objective:   Physical Exam  Nursing note and vitals reviewed. Constitutional: He is oriented to person, place, and time. He appears well-developed and well-nourished.    Overweight, pleasant  HENT:  Head: Normocephalic and atraumatic.  Right Ear: External ear normal.  Left Ear: External ear normal.  Nose: Nose normal.  Mouth/Throat: Oropharynx is clear and moist. No oropharyngeal exudate.  Eyes: Conjunctivae and EOM are normal. Pupils are equal, round, and reactive to light. Right eye exhibits no discharge. Left eye exhibits no discharge. No scleral icterus.  Neck: Normal range of motion. Neck supple. No tracheal deviation present. No thyromegaly present.  No bruits in the carotids  Cardiovascular: Normal rate, regular rhythm, normal heart sounds and intact distal pulses.  Exam reveals no gallop and no friction rub.   No murmur heard. At 72 per minute  Pulmonary/Chest: Effort normal and breath sounds normal. No respiratory distress. He has no wheezes. He has no rales. He exhibits no tenderness.  Axillary negative  Abdominal: Soft. Bowel sounds are normal. He exhibits no mass. There is no tenderness. There is no rebound and no guarding.   Severe obesity. No inguinal nodes.  Musculoskeletal: Normal range of motion. He exhibits no edema and no tenderness.  Lymphadenopathy:    He has no cervical adenopathy.  Neurological: He is alert and oriented to person, place, and time. He has normal reflexes. No cranial nerve deficit.  Skin: Skin is warm and dry. No rash noted. No erythema. No pallor.  Psychiatric: He has a normal mood and affect. His behavior is normal. Judgment and thought content normal.   BP 150/80  Pulse 75  Temp(Src) 97.7 F (36.5 C) (Oral)  Ht 5\' 7"  (1.702 m)  Wt 236 lb (107.049 kg)  BMI 36.95 kg/m2        Assessment & Plan:  1. HTN (hypertension) - POCT CBC - Hepatic function panel - BMP8+EGFR  2. Diabetes mellitus type II controlled - POCT CBC - POCT glycosylated hemoglobin (Hb A1C)  3. BPH (benign prostatic hyperplasia) - POCT CBC  4. Hyperlipidemia - POCT CBC - NMR, lipoprofile  5. Vitamin D deficiency - Vit D  25  hydroxy (rtn osteoporosis monitoring)  6. Urinary frequency - Urinalysis - Glucose/Protein - Urinalysis Dipstick No orders of the defined types were placed in this encounter.   Orders Placed This Encounter  Procedures  . Hepatic function panel  . BMP8+EGFR  . NMR, lipoprofile  . Vit D  25 hydroxy (rtn osteoporosis monitoring)  . POCT CBC  . POCT glycosylated hemoglobin (Hb A1C)  . Urinalysis - Glucose/Protein  . Urinalysis Dipstick   Patient Instructions  Continue current medications. Continue good therapeutic lifestyle changes which include good diet and exercise. Fall precautions discussed with patient. Schedule your flu vaccine if you haven't had it yet If you are over 65 years old - you may need Prevnar 13 or the adult Pneumonia vaccine. Continue to watch diet closely and try to drink more water and get as much exercise as possible   Nyra Capes MD

## 2013-06-22 LAB — HEPATIC FUNCTION PANEL
ALT: 28 IU/L (ref 0–44)
AST: 35 IU/L (ref 0–40)
Alkaline Phosphatase: 101 IU/L (ref 39–117)
Bilirubin, Direct: 0.14 mg/dL (ref 0.00–0.40)

## 2013-06-22 LAB — BMP8+EGFR
BUN: 10 mg/dL (ref 8–27)
CO2: 26 mmol/L (ref 18–29)
Calcium: 9.5 mg/dL (ref 8.6–10.2)
Chloride: 100 mmol/L (ref 97–108)
GFR calc Af Amer: 84 mL/min/{1.73_m2} (ref >59)
Glucose: 135 mg/dL — ABNORMAL HIGH (ref 65–99)
Potassium: 4.7 mmol/L (ref 3.5–5.2)

## 2013-06-22 LAB — NMR, LIPOPROFILE
Cholesterol: 116 mg/dL (ref <200)
LDL Particle Number: 913 nmol/L (ref <1000)
LDL Size: 20.5 nm — ABNORMAL LOW (ref >20.5)
LP-IR Score: 73 — ABNORMAL HIGH (ref <=45)

## 2013-06-22 LAB — VITAMIN D 25 HYDROXY (VIT D DEFICIENCY, FRACTURES): Vit D, 25-Hydroxy: 32.8 ng/mL (ref 30.0–100.0)

## 2013-06-27 ENCOUNTER — Other Ambulatory Visit: Payer: Self-pay | Admitting: Family Medicine

## 2013-06-27 DIAGNOSIS — E119 Type 2 diabetes mellitus without complications: Secondary | ICD-10-CM

## 2013-06-27 MED ORDER — SITAGLIPTIN PHOS-METFORMIN HCL 50-500 MG PO TABS: 1.0000 | ORAL_TABLET | Freq: Two times a day (BID) | ORAL | Status: DC

## 2013-06-27 NOTE — Telephone Encounter (Signed)
Pt aware samples janumet 50/500 at front desk

## 2013-06-30 HISTORY — PX: COLONOSCOPY: SHX174

## 2013-07-19 ENCOUNTER — Encounter: Payer: Self-pay | Admitting: Internal Medicine

## 2013-07-22 ENCOUNTER — Ambulatory Visit (AMBULATORY_SURGERY_CENTER): Payer: Medicare PPO | Admitting: *Deleted

## 2013-07-22 VITALS — Ht 67.0 in | Wt 236.0 lb

## 2013-07-22 DIAGNOSIS — Z1211 Encounter for screening for malignant neoplasm of colon: Secondary | ICD-10-CM

## 2013-07-22 MED ORDER — MOVIPREP 100 G PO SOLR: ORAL | Status: DC

## 2013-07-22 NOTE — Progress Notes (Signed)
No allergies to eggs or soy. No problems with anesthesia.  

## 2013-07-28 ENCOUNTER — Encounter: Payer: Self-pay | Admitting: Internal Medicine

## 2013-08-04 ENCOUNTER — Telehealth: Payer: Self-pay | Admitting: Family Medicine

## 2013-08-08 ENCOUNTER — Telehealth: Payer: Self-pay | Admitting: Gastroenterology

## 2013-08-08 NOTE — Telephone Encounter (Signed)
Patient aware.

## 2013-08-08 NOTE — Telephone Encounter (Signed)
Called Andrew [name] to let him know Dr. Hilarie Fredrickson had an opening on the same day as his scheduled colonoscopy, in the morning. Andrew [name] decided to take it

## 2013-08-09 ENCOUNTER — Other Ambulatory Visit: Payer: Self-pay | Admitting: *Deleted

## 2013-08-09 DIAGNOSIS — E119 Type 2 diabetes mellitus without complications: Secondary | ICD-10-CM

## 2013-08-09 MED ORDER — SITAGLIPTIN PHOS-METFORMIN HCL 50-500 MG PO TABS: 1.0000 | ORAL_TABLET | Freq: Two times a day (BID) | ORAL | Status: DC

## 2013-08-09 MED ORDER — TAMSULOSIN HCL 0.4 MG PO CAPS: 0.4000 mg | ORAL_CAPSULE | Freq: Every day | ORAL | Status: DC

## 2013-08-09 MED ORDER — LISINOPRIL-HYDROCHLOROTHIAZIDE 20-25 MG PO TABS: 1.0000 | ORAL_TABLET | Freq: Every day | ORAL | Status: DC

## 2013-08-09 MED ORDER — ATORVASTATIN CALCIUM 40 MG PO TABS: 40.0000 mg | ORAL_TABLET | Freq: Every day | ORAL | Status: DC

## 2013-08-10 LAB — HM DIABETES EYE EXAM

## 2013-08-11 ENCOUNTER — Ambulatory Visit (AMBULATORY_SURGERY_CENTER): Payer: Medicare PPO | Admitting: Internal Medicine

## 2013-08-11 ENCOUNTER — Encounter: Payer: Self-pay | Admitting: Internal Medicine

## 2013-08-11 VITALS — BP 129/73 | HR 60 | Temp 96.2°F | Resp 20 | Ht 67.0 in | Wt 236.0 lb

## 2013-08-11 DIAGNOSIS — D126 Benign neoplasm of colon, unspecified: Secondary | ICD-10-CM

## 2013-08-11 DIAGNOSIS — Z1211 Encounter for screening for malignant neoplasm of colon: Secondary | ICD-10-CM

## 2013-08-11 MED ORDER — SODIUM CHLORIDE 0.9 % IV SOLN: 500.0000 mL | INTRAVENOUS | Status: DC

## 2013-08-11 NOTE — Op Note (Signed)
[place]  [address]. [place], [address]   COLONOSCOPY PROCEDURE REPORT  PATIENT: Andrew [name], Andrew [name]  MR#: [id number] BIRTHDATE: September 10, 1945 , 29  yrs. old GENDER: Male ENDOSCOPIST: Jerene Bears, MD REFERRED IO:EVOJJK Laurance Flatten, M.D. PROCEDURE DATE:  08/11/2013 PROCEDURE:   Colonoscopy with cold biopsy polypectomy and Colonoscopy with snare polypectomy First Screening Colonoscopy - Avg.  risk and is 50 yrs.  old or older Yes.  Prior Negative Screening - Now for repeat screening. N/A  History of Adenoma - Now for follow-up colonoscopy & has been > or = to 3 yrs.  N/A  Polyps Removed Today? Yes. ASA CLASS:   Class III INDICATIONS:average risk screening and first colonoscopy. MEDICATIONS: MAC sedation, administered by CRNA and propofol (Diprivan) 350mg  IV  DESCRIPTION OF PROCEDURE:   After the risks benefits and alternatives of the procedure were thoroughly explained, informed consent was obtained.  A digital rectal exam revealed no rectal mass.   The LB [id number]  endoscope was introduced through the anus and advanced to the cecum, which was identified by both the appendix and ileocecal valve. No adverse events experienced. The quality of the prep was good, using MoviPrep  The instrument was then slowly withdrawn as the colon was fully examined.   COLON FINDINGS: Mild diverticulosis was noted in the ascending colon, descending colon, and sigmoid colon.   Two sessile polyps measuring 4-5 mm in size were found in the transverse colon. Polypectomy was performed using cold snare.  All resections were complete and all polyp tissue was completely retrieved.   A sessile polyp measuring 5 mm in size was found in the sigmoid colon.  A polypectomy was performed with cold forceps.  The resection was complete and the polyp tissue was completely retrieved.   A sessile polyp measuring 5 mm in size was found in the rectum.  A polypectomy was performed with a cold snare.   The resection was complete and the polyp tissue was completely retrieved. Retroflexed views revealed internal hemorrhoids. On removing the scope from the rectum there was flat polypoid tissue in the anal canal which approximates the dentate line.  A single biopsy was obtained to exclude adenomatous change.  The time to cecum=6 minutes 18 seconds.  Withdrawal time=23 minutes 03 seconds.  The scope was withdrawn and the procedure completed.  COMPLICATIONS: There were no complications.  ENDOSCOPIC IMPRESSION: 1.   Mild diverticulosis was noted in the ascending colon, descending colon, and sigmoid colon 2.   Two sessile polyps measuring 4-5 mm in size were found in the transverse colon; Polypectomy was performed using cold snare 3.   Sessile polyp measuring 5 mm in size was found in the sigmoid colon; polypectomy was performed with cold forceps 4.   Sessile polyp measuring 5 mm in size was found in the rectum; polypectomy was performed with a cold snare 5.   Possible polyp in the anal canal, biopsy performed 6.   Moderate to large internal hemorrhoids  RECOMMENDATIONS: 1.  Await pathology results 2.  High fiber diet 3.  Timing of repeat colonoscopy will be determined by pathology findings. 4.  You will receive a letter within 1-2 weeks with the results of your biopsy as well as final recommendations.  Please call my office if you have not received a letter after 3 weeks.   eSigned:  Jerene Bears, MD 08/11/2013 11:22 AM   cc: The Patient and Redge Gainer, MD   PATIENT NAME:  Andrew [name], Andrew [name].  MR#: [id number]

## 2013-08-11 NOTE — Progress Notes (Signed)
Pt drank cup of chicken broth at 8:30- J Nulty CRNA aware  Pt ate breakfast and a bologna sandwich yesterday- last solid food at 1500.  Dr. Norman Herrlich aware.  Pt states he drank his entire prep and is clear in his stool now

## 2013-08-11 NOTE — Progress Notes (Signed)
A/ox3 pleased with MAC, report to Celia RN 

## 2013-08-11 NOTE — Patient Instructions (Signed)
Discharge instructions given with verbal understanding. Handouts on polyps and diverticulosis. Resume previous medications. YOU HAD AN ENDOSCOPIC PROCEDURE TODAY AT THE [facility]: Refer to the procedure report that was given to you for any specific questions about what was found during the examination.  If the procedure report does not answer your questions, please call your gastroenterologist to clarify.  If you requested that your care partner not be given the details of your procedure findings, then the procedure report has been included in a sealed envelope for you to review at your convenience later.  YOU SHOULD EXPECT: Some feelings of bloating in the abdomen. Passage of more gas than usual.  Walking can help get rid of the air that was put into your GI tract during the procedure and reduce the bloating. If you had a lower endoscopy (such as a colonoscopy or flexible sigmoidoscopy) you may notice spotting of blood in your stool or on the toilet paper. If you underwent a bowel prep for your procedure, then you may not have a normal bowel movement for a few days.  DIET: Your first meal following the procedure should be a light meal and then it is ok to progress to your normal diet.  A half-sandwich or bowl of soup is an example of a good first meal.  Heavy or fried foods are harder to digest and may make you feel nauseous or bloated.  Likewise meals heavy in dairy and vegetables can cause extra gas to form and this can also increase the bloating.  Drink plenty of fluids but you should avoid alcoholic beverages for 24 hours.  ACTIVITY: Your care partner should take you home directly after the procedure.  You should plan to take it easy, moving slowly for the rest of the day.  You can resume normal activity the day after the procedure however you should NOT DRIVE or use heavy machinery for 24 hours (because of the sedation medicines used during the test).    SYMPTOMS TO REPORT  IMMEDIATELY: A gastroenterologist can be reached at any hour.  During normal business hours, 8:30 AM to 5:00 PM Monday through Friday, call [phone].  After hours and on weekends, please call the GI answering service at [phone] who will take a message and have the physician on call contact you.   Following lower endoscopy (colonoscopy or flexible sigmoidoscopy):  Excessive amounts of blood in the stool  Significant tenderness or worsening of abdominal pains  Swelling of the abdomen that is new, acute  Fever of 100F or higher  FOLLOW UP: If any biopsies were taken you will be contacted by phone or by letter within the next 1-3 weeks.  Call your gastroenterologist if you have not heard about the biopsies in 3 weeks.  Our staff will call the home number listed on your records the next business day following your procedure to check on you and address any questions or concerns that you may have at that time regarding the information given to you following your procedure. This is a courtesy call and so if there is no answer at the home number and we have not heard from you through the emergency physician on call, we will assume that you have returned to your regular daily activities without incident.  SIGNATURES/CONFIDENTIALITY: You and/or your care partner have signed paperwork which will be entered into your electronic medical record.  These signatures attest to the fact that that the information above on your After Visit Summary   has been reviewed and is understood.  Full responsibility of the confidentiality of this discharge information lies with you and/or your care-partner. 

## 2013-08-11 NOTE — Progress Notes (Signed)
Called to room to assist during endoscopic procedure.  Patient ID and intended procedure confirmed with present staff. Received instructions for my participation in the procedure from the performing physician.  

## 2013-08-12 ENCOUNTER — Telehealth: Payer: Self-pay | Admitting: *Deleted

## 2013-08-12 NOTE — Telephone Encounter (Signed)
  Follow up Call-  Call back number 08/11/2013  Post procedure Call Back phone  # [phone]  Permission to leave phone message Yes     Patient questions:  Do you have a fever, pain , or abdominal swelling? no Pain Score  0 *  Have you tolerated food without any problems? yes  Have you been able to return to your normal activities? yes  Do you have any questions about your discharge instructions: Diet   no Medications  no Follow up visit  no  Do you have questions or concerns about your Care? no  Actions: * If pain score is 4 or above: No action needed, pain <4.

## 2013-08-21 ENCOUNTER — Encounter: Payer: Self-pay | Admitting: Internal Medicine

## 2013-09-01 ENCOUNTER — Telehealth: Payer: Self-pay | Admitting: Family Medicine

## 2013-09-01 NOTE — Telephone Encounter (Signed)
Patient aware samples up front  

## 2013-09-15 ENCOUNTER — Other Ambulatory Visit: Payer: Self-pay | Admitting: *Deleted

## 2013-10-04 ENCOUNTER — Telehealth: Payer: Self-pay | Admitting: Family Medicine

## 2013-10-04 DIAGNOSIS — E119 Type 2 diabetes mellitus without complications: Secondary | ICD-10-CM

## 2013-10-04 MED ORDER — SITAGLIPTIN PHOS-METFORMIN HCL 50-500 MG PO TABS: 1.0000 | ORAL_TABLET | Freq: Two times a day (BID) | ORAL | Status: DC

## 2013-10-04 NOTE — Telephone Encounter (Signed)
Samples are upfront for patient to pickup

## 2013-10-11 ENCOUNTER — Other Ambulatory Visit: Payer: Self-pay | Admitting: *Deleted

## 2013-10-11 ENCOUNTER — Telehealth: Payer: Self-pay | Admitting: Family Medicine

## 2013-10-11 MED ORDER — ACCU-CHEK AVIVA PLUS W/DEVICE KIT: PACK | Status: DC

## 2013-10-11 MED ORDER — GLUCOSE BLOOD VI STRP: ORAL_STRIP | Status: DC

## 2013-10-11 MED ORDER — ACCU-CHEK SOFTCLIX LANCETS MISC: Status: DC

## 2013-10-24 ENCOUNTER — Encounter: Payer: Self-pay | Admitting: Family Medicine

## 2013-10-24 ENCOUNTER — Ambulatory Visit (INDEPENDENT_AMBULATORY_CARE_PROVIDER_SITE_OTHER): Payer: Commercial Managed Care - HMO | Admitting: Family Medicine

## 2013-10-24 VITALS — BP 133/73 | HR 74 | Temp 97.0°F | Ht 67.0 in | Wt 232.0 lb

## 2013-10-24 DIAGNOSIS — N400 Enlarged prostate without lower urinary tract symptoms: Secondary | ICD-10-CM

## 2013-10-24 DIAGNOSIS — I10 Essential (primary) hypertension: Secondary | ICD-10-CM

## 2013-10-24 DIAGNOSIS — E785 Hyperlipidemia, unspecified: Secondary | ICD-10-CM

## 2013-10-24 DIAGNOSIS — E559 Vitamin D deficiency, unspecified: Secondary | ICD-10-CM

## 2013-10-24 DIAGNOSIS — E119 Type 2 diabetes mellitus without complications: Secondary | ICD-10-CM

## 2013-10-24 LAB — POCT GLYCOSYLATED HEMOGLOBIN (HGB A1C): Hemoglobin A1C: 8.3

## 2013-10-24 LAB — POCT CBC
Granulocyte percent: 55.6 %G (ref 37–80)
HEMATOCRIT: 39.3 % — AB (ref 43.5–53.7)
HEMOGLOBIN: 12.6 g/dL — AB (ref 14.1–18.1)
Lymph, poc: 2.5 (ref 0.6–3.4)
MCH: 29 pg (ref 27–31.2)
MCHC: 32.2 g/dL (ref 31.8–35.4)
MCV: 90.2 fL (ref 80–97)
MPV: 9.3 fL (ref 0–99.8)
POC Granulocyte: 3.7 (ref 2–6.9)
POC LYMPH PERCENT: 37.4 %L (ref 10–50)
Platelet Count, POC: 200 10*3/uL (ref 142–424)
RBC: 4.4 M/uL — AB (ref 4.69–6.13)
RDW, POC: 14 %
WBC: 6.7 10*3/uL (ref 4.6–10.2)

## 2013-10-24 LAB — POCT UA - MICROALBUMIN: Microalbumin Ur, POC: 20 mg/L

## 2013-10-24 MED ORDER — METFORMIN HCL 500 MG PO TABS: 500.0000 mg | ORAL_TABLET | Freq: Two times a day (BID) | ORAL | Status: DC

## 2013-10-24 NOTE — Addendum Note (Signed)
Addended by: Pollyann Kennedy F on: 10/24/2013 11:00 AM   Modules accepted: Orders

## 2013-10-24 NOTE — Progress Notes (Signed)
Subjective:    Patient ID: Andrew [name], male    DOB: 08/03/1945, 68 y.o.   MRN: [id number]  HPI Pt here for follow up and management of chronic medical problems. The patient has been doing well with no specific complaints, except some cramps in the bilateral rib cage area lasting 30-40 seconds using related to movement . Home blood sugars brought in for review are running in the 150-190 range fasting and as high as the 240s during the day. The patient is followed for his BPH by Dr. [name] and this exam is scheduled in May 2015. The patient had an eye exam in February of this year. He is the lab work today and is deemed a chest x-ray on the next visit.        Patient Active Problem List   Diagnosis Date Noted  . Diabetes mellitus type II controlled 09/14/2010  . BPH (benign prostatic hyperplasia) 09/14/2010  . HTN (hypertension) 09/14/2010  . Hyperlipidemia 09/14/2010  . Tobacco abuse 09/14/2010   Outpatient Encounter Prescriptions as of 10/24/2013  Medication Sig  . ACCU-CHEK SOFTCLIX LANCETS lancets Check blood sugar tid, Dx 250.00  . aspirin 81 MG tablet Take 81 mg by mouth daily.  Marland Kitchen atorvastatin (LIPITOR) 40 MG tablet Take 1 tablet (40 mg total) by mouth daily.  . Blood Glucose Monitoring Suppl (ACCU-CHEK AVIVA PLUS) W/DEVICE KIT Use to check Blood sugar Tid Dx 250.00  . cholecalciferol (VITAMIN D) 1000 UNITS tablet Take 1,000 Units by mouth daily.  Marland Kitchen glucose blood (ACCU-CHEK AVIVA PLUS) test strip Test Blood sugar Tid. Dx 250.00  . lisinopril-hydrochlorothiazide (PRINZIDE,ZESTORETIC) 20-25 MG per tablet Take 1 tablet by mouth daily.  . sitaGLIPtin-metformin (JANUMET) 50-500 MG per tablet Take 1 tablet by mouth 2 (two) times daily with a meal.  . tamsulosin (FLOMAX) 0.4 MG CAPS capsule Take 1 capsule (0.4 mg total) by mouth daily.    Review of Systems  Constitutional: Negative.   HENT: Negative.   Eyes: Negative.   Respiratory: Negative.   Cardiovascular: Negative.     Gastrointestinal: Negative.   Endocrine: Negative.   Genitourinary: Negative.   Musculoskeletal: Negative.   Skin: Negative.   Allergic/Immunologic: Negative.   Neurological: Negative.   Hematological: Negative.   Psychiatric/Behavioral: Negative.        Objective:   Physical Exam  Nursing note and vitals reviewed. Constitutional: He is oriented to person, place, and time. He appears well-developed and well-nourished. No distress.  HENT:  Head: Normocephalic and atraumatic.  Right Ear: External ear normal.  Left Ear: External ear normal.  Mouth/Throat: Oropharynx is clear and moist. No oropharyngeal exudate.  Minimal nasal congestion bilaterally  Eyes: Conjunctivae and EOM are normal. Pupils are equal, round, and reactive to light. Right eye exhibits no discharge. Left eye exhibits no discharge. No scleral icterus.  Neck: Normal range of motion. Neck supple. No thyromegaly present.  No carotid bruits  Cardiovascular: Normal rate, regular rhythm, normal heart sounds and intact distal pulses.  Exam reveals no gallop and no friction rub.   No murmur heard. At 72 per min  Pulmonary/Chest: Effort normal and breath sounds normal. No respiratory distress. He has no wheezes. He has no rales. He exhibits no tenderness.  Prominent breast tissue bilaterally  Abdominal: Soft. Bowel sounds are normal. He exhibits no mass. There is no tenderness. There is no rebound and no guarding.  Prominent obesity  Genitourinary:  Rectal exam and prostate including PSA are to be done by the urologist in  May  Musculoskeletal: Normal range of motion. He exhibits no edema and no tenderness.  Lymphadenopathy:    He has no cervical adenopathy.  Neurological: He is alert and oriented to person, place, and time. He has normal reflexes. No cranial nerve deficit.  Skin: Skin is warm and dry. No rash noted.  Psychiatric: He has a normal mood and affect. His behavior is normal. Judgment and thought content  normal.   BP 133/73  Pulse 74  Temp(Src) 97 F (36.1 C) (Oral)  Ht _0  (1.702 m)  Wt 232 lb (105.235 kg)  BMI 36.33 kg/m2        Assessment & Plan:  1. BPH (benign prostatic hyperplasia) - POCT CBC - Ambulatory referral to Urology  2. Diabetes mellitus type II controlled - POCT CBC - POCT glycosylated hemoglobin (Hb A1C) - POCT UA - Microalbumin - metFORMIN (GLUCOPHAGE) 500 MG tablet; Take 1 tablet (500 mg total) by mouth 2 (two) times daily with a meal.  Dispense: 180 tablet; Refill: 3  3. HTN (hypertension) - POCT CBC - POCT UA - Microalbumin - BMP8+EGFR - Hepatic function panel  4. Hyperlipidemia - POCT CBC - NMR, lipoprofile  5. Vitamin D deficiency - Vit D  25 hydroxy (rtn osteoporosis monitoring)  Patient Instructions                       Medicare Annual Wellness Visit  [facility] and the medical providers at [facility] strive to bring you the best medical care.  In doing so we not only want to address your current medical conditions and concerns but also to detect new conditions early and prevent illness, disease and health-related problems.    Medicare offers a yearly Wellness Visit which allows our clinical staff to assess your need for preventative services including immunizations, lifestyle education, counseling to decrease risk of preventable diseases and screening for fall risk and other medical concerns.    This visit is provided free of charge (no copay) for all Medicare recipients. The clinical pharmacists at [facility] have begun to conduct these Wellness Visits which will also include a thorough review of all your medications.    As you primary medical provider recommend that you make an appointment for your Annual Wellness Visit if you have not done so already this year.  You may set up this appointment before you leave today or you may call back [phone]) and schedule an appointment.  Please make  sure when you call that you mention that you are scheduling your Annual Wellness Visit with the clinical pharmacist so that the appointment may be made for the proper length of time.      Continue current medications. Continue good therapeutic lifestyle changes which include good diet and exercise. Fall precautions discussed with patient. If an FOBT was given today- please return it to our front desk. If you are over 58 years old - you may need Prevnar 40 or the adult Pneumonia vaccine.  Please please try to do better with exercise and diet Try to walk every day Drink plenty of water Continue to monitor blood sugars and blood pressure We will schedule you for a visit with the clinical pharmacist to review your blood sugars in 3-4 weeks. Keep followup visit with the urologist Return to the FOBT in late July or August   Arrie Senate MD

## 2013-10-24 NOTE — Patient Instructions (Addendum)
Medicare Annual Wellness Visit  [facility] and the medical providers at [facility] strive to bring you the best medical care.  In doing so we not only want to address your current medical conditions and concerns but also to detect new conditions early and prevent illness, disease and health-related problems.    Medicare offers a yearly Wellness Visit which allows our clinical staff to assess your need for preventative services including immunizations, lifestyle education, counseling to decrease risk of preventable diseases and screening for fall risk and other medical concerns.    This visit is provided free of charge (no copay) for all Medicare recipients. The clinical pharmacists at [facility] have begun to conduct these Wellness Visits which will also include a thorough review of all your medications.    As you primary medical provider recommend that you make an appointment for your Annual Wellness Visit if you have not done so already this year.  You may set up this appointment before you leave today or you may call back [phone]) and schedule an appointment.  Please make sure when you call that you mention that you are scheduling your Annual Wellness Visit with the clinical pharmacist so that the appointment may be made for the proper length of time.      Continue current medications. Continue good therapeutic lifestyle changes which include good diet and exercise. Fall precautions discussed with patient. If an FOBT was given today- please return it to our front desk. If you are over 38 years old - you may need Prevnar 30 or the adult Pneumonia vaccine.  Please please try to do better with exercise and diet Try to walk every day Drink plenty of water Continue to monitor blood sugars and blood pressure We will schedule you for a visit with the clinical pharmacist to review your blood sugars in 3-4 weeks. Keep followup  visit with the urologist Return to the FOBT in late July or August

## 2013-10-25 LAB — HEPATIC FUNCTION PANEL
ALBUMIN: 3.9 g/dL (ref 3.6–4.8)
ALK PHOS: 95 IU/L (ref 39–117)
ALT: 30 IU/L (ref 0–44)
AST: 29 IU/L (ref 0–40)
Bilirubin, Direct: 0.14 mg/dL (ref 0.00–0.40)
Total Bilirubin: 0.5 mg/dL (ref 0.0–1.2)
Total Protein: 6.9 g/dL (ref 6.0–8.5)

## 2013-10-25 LAB — BMP8+EGFR
BUN/Creatinine Ratio: 17 (ref 10–22)
BUN: 17 mg/dL (ref 8–27)
CO2: 24 mmol/L (ref 18–29)
Calcium: 9.2 mg/dL (ref 8.6–10.2)
Chloride: 98 mmol/L (ref 97–108)
Creatinine, Ser: 1.02 mg/dL (ref 0.76–1.27)
GFR, EST AFRICAN AMERICAN: 88 mL/min/{1.73_m2} (ref >59)
GFR, EST NON AFRICAN AMERICAN: 76 mL/min/{1.73_m2} (ref >59)
Glucose: 184 mg/dL — ABNORMAL HIGH (ref 65–99)
Potassium: 4.5 mmol/L (ref 3.5–5.2)
Sodium: 139 mmol/L (ref 134–144)

## 2013-10-25 LAB — VITAMIN D 25 HYDROXY (VIT D DEFICIENCY, FRACTURES): VIT D 25 HYDROXY: 36.3 ng/mL (ref 30.0–100.0)

## 2013-10-25 LAB — NMR, LIPOPROFILE
Cholesterol: 120 mg/dL (ref <200)
HDL CHOLESTEROL BY NMR: 26 mg/dL — AB (ref >=40)
HDL PARTICLE NUMBER: 22.1 umol/L — AB (ref >=30.5)
LDL Particle Number: 1015 nmol/L — ABNORMAL HIGH (ref <1000)
LDL Size: 19.6 nm (ref >20.5)
LDLC SERPL CALC-MCNC: 57 mg/dL (ref <100)
LP-IR Score: 73 — ABNORMAL HIGH (ref <=45)
SMALL LDL PARTICLE NUMBER: 796 nmol/L — AB (ref <=527)
Triglycerides by NMR: 187 mg/dL — ABNORMAL HIGH (ref <150)

## 2013-10-25 LAB — MICROALBUMIN, URINE: MICROALBUM., U, RANDOM: 9.2 ug/mL (ref 0.0–17.0)

## 2013-12-13 ENCOUNTER — Telehealth: Payer: Self-pay | Admitting: Family Medicine

## 2013-12-13 DIAGNOSIS — E119 Type 2 diabetes mellitus without complications: Secondary | ICD-10-CM

## 2013-12-14 MED ORDER — METFORMIN HCL ER 500 MG PO TB24: ORAL_TABLET | ORAL | Status: DC

## 2013-12-14 NOTE — Telephone Encounter (Signed)
Patient made aware that we dont have Janumet samples.  I can get Januvia 100mg  and metfromin.  Patient states that he experienced diarrhea with metformin in past but has tolerated Janumet 50/500 well.  Will change to metformin XR 500mg  1 tablet BID which is usually associated with less diarrhea and leave samples of Januvia 100mg  1 tablet daily.

## 2013-12-22 ENCOUNTER — Other Ambulatory Visit: Payer: Self-pay | Admitting: *Deleted

## 2013-12-22 MED ORDER — LISINOPRIL-HYDROCHLOROTHIAZIDE 20-25 MG PO TABS: 1.0000 | ORAL_TABLET | Freq: Every day | ORAL | Status: DC

## 2014-01-02 ENCOUNTER — Encounter: Payer: Self-pay | Admitting: Pharmacist

## 2014-01-02 ENCOUNTER — Ambulatory Visit (INDEPENDENT_AMBULATORY_CARE_PROVIDER_SITE_OTHER): Payer: Commercial Managed Care - HMO | Admitting: Pharmacist

## 2014-01-02 VITALS — BP 112/68 | HR 72 | Ht 67.0 in | Wt 227.5 lb

## 2014-01-02 DIAGNOSIS — I10 Essential (primary) hypertension: Secondary | ICD-10-CM

## 2014-01-02 DIAGNOSIS — E1165 Type 2 diabetes mellitus with hyperglycemia: Secondary | ICD-10-CM

## 2014-01-02 DIAGNOSIS — E1169 Type 2 diabetes mellitus with other specified complication: Secondary | ICD-10-CM

## 2014-01-02 DIAGNOSIS — IMO0002 Reserved for concepts with insufficient information to code with codable children: Secondary | ICD-10-CM

## 2014-01-02 DIAGNOSIS — IMO0001 Reserved for inherently not codable concepts without codable children: Secondary | ICD-10-CM

## 2014-01-02 DIAGNOSIS — E785 Hyperlipidemia, unspecified: Secondary | ICD-10-CM

## 2014-01-02 MED ORDER — SITAGLIP PHOS-METFORMIN HCL ER 50-500 MG PO TB24: 1.0000 | ORAL_TABLET | Freq: Two times a day (BID) | ORAL | Status: DC

## 2014-01-02 NOTE — Progress Notes (Signed)
Subjective:    Andrew [name] is a 68 y.o. male who presents for follow up evaluation of Type 2 diabetes mellitus.  Current symptoms/problems include hyperglycemia and polyuria and have been improving. Symptoms have been present for 6 months.   The patient was initially diagnosed with Type 2 diabetes mellitus about 5 - 6 years ago  Known diabetic complications: cardiovascular disease Cardiovascular risk factors: advanced age (older than 5 for men, 27 for women), diabetes mellitus, dyslipidemia, family history of premature cardiovascular disease, hypertension, male gender, obesity (BMI >= 30 kg/m2) and sedentary lifestyle Current diabetic medications include metformin XR 500mg  bid, Januvia 100mg  1 tablet daily (just restarted about 2 months ago).   Eye exam current (within one year): yes Weight trend: decreasing steadily Prior visit with dietician: no Current diet: not following in specific diet Current exercise: gardening  Current monitoring regimen: home blood tests - 2 to 3  times daily Home blood sugar records: 153, 141, 120, 118, 128, 131, 127, 134, 99, 90, 126, 133, 160, 129, 156, Any episodes of hypoglycemia? no  Is He on ACE inhibitor or angiotensin II receptor blocker?  Yes  lisinopril HCTZ    The following portions of the patient's history were reviewed and updated as appropriate: allergies, current medications, past family history, past medical history, past social history, past surgical history and problem list.   Objective:    There were no vitals taken for this visit. Lab Review Glucose (mg/dL)  Date Value  10/24/2013 184*  06/21/2013 135*     Glucose, Bld (mg/dL)  Date Value  11/18/2012 142*  02/19/2009 104*  02/12/2009 70      CO2 (mmol/L)  Date Value  10/24/2013 24   06/21/2013 26   11/18/2012 27      BUN (mg/dL)  Date Value  10/24/2013 17   06/21/2013 10   11/18/2012 13   02/12/2009 15      Creat (mg/dL)  Date Value  11/18/2012 1.16       Creatinine, Ser (mg/dL)  Date Value  10/24/2013 1.02   06/21/2013 1.06   02/12/2009 1.02    A1c was 8.3% (10/24/13)  Assessment:    Diabetes Mellitus type II, under inadequate control.  Hypertension - at goals Hyperlipidemia - mix and complicated by DM - LDL at goal;  HDL low and Tg elevated - better BG control should improve   Plan:    1.  Rx changes: none 2.  Education: Reviewed 'ABCs' of diabetes management (respective goals in parentheses):  A1C (<7), blood pressure (<130/80), and cholesterol (LDL <100). 3.  Compliance at present is estimated to be good. Efforts to improve compliance (if necessary) will be directed at dietary modifications: discussed recommended CHO serving sizes - handout given.  Increase lean protein sources, fiber in diet and non starchy vegetables, increased exercise and regular blood sugar monitoring: 1 to 2  times daily. 4. Follow up: 6 weeks - PCP  Cherre Robins, PharmD, CPP

## 2014-02-13 ENCOUNTER — Telehealth: Payer: Self-pay | Admitting: Family Medicine

## 2014-02-13 MED ORDER — SITAGLIPTIN PHOSPHATE 100 MG PO TABS: 100.0000 mg | ORAL_TABLET | Freq: Every day | ORAL | Status: DC

## 2014-02-13 NOTE — Telephone Encounter (Signed)
Samples up front and message left for patient

## 2014-02-27 ENCOUNTER — Encounter: Payer: Self-pay | Admitting: Family Medicine

## 2014-02-27 ENCOUNTER — Ambulatory Visit (INDEPENDENT_AMBULATORY_CARE_PROVIDER_SITE_OTHER): Payer: Commercial Managed Care - HMO | Admitting: Family Medicine

## 2014-02-27 VITALS — BP 131/68 | HR 65 | Temp 97.0°F | Ht 67.0 in | Wt 229.0 lb

## 2014-02-27 DIAGNOSIS — E559 Vitamin D deficiency, unspecified: Secondary | ICD-10-CM

## 2014-02-27 DIAGNOSIS — H6120 Impacted cerumen, unspecified ear: Secondary | ICD-10-CM

## 2014-02-27 DIAGNOSIS — N400 Enlarged prostate without lower urinary tract symptoms: Secondary | ICD-10-CM

## 2014-02-27 DIAGNOSIS — E785 Hyperlipidemia, unspecified: Secondary | ICD-10-CM

## 2014-02-27 DIAGNOSIS — IMO0002 Reserved for concepts with insufficient information to code with codable children: Secondary | ICD-10-CM

## 2014-02-27 DIAGNOSIS — H6122 Impacted cerumen, left ear: Secondary | ICD-10-CM

## 2014-02-27 DIAGNOSIS — IMO0001 Reserved for inherently not codable concepts without codable children: Secondary | ICD-10-CM

## 2014-02-27 DIAGNOSIS — E1169 Type 2 diabetes mellitus with other specified complication: Secondary | ICD-10-CM

## 2014-02-27 DIAGNOSIS — E1165 Type 2 diabetes mellitus with hyperglycemia: Secondary | ICD-10-CM

## 2014-02-27 DIAGNOSIS — Z23 Encounter for immunization: Secondary | ICD-10-CM

## 2014-02-27 DIAGNOSIS — I10 Essential (primary) hypertension: Secondary | ICD-10-CM

## 2014-02-27 MED ORDER — ATORVASTATIN CALCIUM 40 MG PO TABS: 40.0000 mg | ORAL_TABLET | Freq: Every day | ORAL | Status: DC

## 2014-02-27 MED ORDER — METFORMIN HCL ER 500 MG PO TB24: ORAL_TABLET | ORAL | Status: DC

## 2014-02-27 NOTE — Addendum Note (Signed)
Addended by: Zannie Cove on: 02/27/2014 11:11 AM   Modules accepted: Orders

## 2014-02-27 NOTE — Progress Notes (Signed)
Subjective:    Patient ID: Andrew [name], male    DOB: 1946-04-24, 68 y.o.   MRN: [id number]  HPI Pt here for follow up and management of chronic medical problems.  The pati he will be given an FOBT to return. He'll also receive his Prevnar vaccine today. Refills will be done for Lipitor and metformin. His review of systems are negative. the patient does complain of mite bites on his ankles. These appear to be drying up and healing. He brings in his blood sugars and blood pressures for review. His blood sugars have dramatically improved and his blood pressures remained good.        Patient Active Problem List   Diagnosis Date Noted  . Diabetes mellitus type 2, uncontrolled 09/14/2010  . BPH (benign prostatic hyperplasia) 09/14/2010  . HTN (hypertension) 09/14/2010  . Hyperlipidemia associated with type 2 diabetes mellitus 09/14/2010  . Tobacco abuse 09/14/2010   Outpatient Encounter Prescriptions as of 02/27/2014  Medication Sig  . ACCU-CHEK SOFTCLIX LANCETS lancets Check blood sugar tid, Dx 250.00  . aspirin 81 MG tablet Take 81 mg by mouth daily.  Marland Kitchen atorvastatin (LIPITOR) 40 MG tablet Take 1 tablet (40 mg total) by mouth daily.  . Blood Glucose Monitoring Suppl (ACCU-CHEK AVIVA PLUS) W/DEVICE KIT Use to check Blood sugar Tid Dx 250.00  . cholecalciferol (VITAMIN D) 1000 UNITS tablet Take 1,000 Units by mouth daily.  Marland Kitchen glucose blood (ACCU-CHEK AVIVA PLUS) test strip Test Blood sugar Tid. Dx 250.00  . lisinopril-hydrochlorothiazide (PRINZIDE,ZESTORETIC) 20-25 MG per tablet Take 1 tablet by mouth daily.  . metFORMIN (GLUCOPHAGE XR) 500 MG 24 hr tablet Take 1 tablet twice a day with breakfast and supper  . sitaGLIPtin (JANUVIA) 100 MG tablet Take 1 tablet (100 mg total) by mouth daily.  . tamsulosin (FLOMAX) 0.4 MG CAPS capsule Take 1 capsule (0.4 mg total) by mouth daily.  . [DISCONTINUED] atorvastatin (LIPITOR) 40 MG tablet Take 1 tablet (40 mg total) by mouth daily.  .  [DISCONTINUED] metFORMIN (GLUCOPHAGE XR) 500 MG 24 hr tablet Take 1 tablet twice a day with breakfast and supper  . [DISCONTINUED] SitaGLIPtin-MetFORMIN HCl (JANUMET XR) 50-500 MG TB24 Take 1 tablet by mouth 2 (two) times daily. With food    Review of Systems  Constitutional: Negative.   HENT: Negative.   Eyes: Negative.   Respiratory: Negative.   Cardiovascular: Negative.   Gastrointestinal: Negative.   Endocrine: Negative.   Genitourinary: Negative.   Musculoskeletal: Negative.   Skin: Negative.   Allergic/Immunologic: Negative.   Neurological: Negative.   Hematological: Negative.   Psychiatric/Behavioral: Negative.        Objective:   Physical Exam  Nursing note and vitals reviewed. Constitutional: He is oriented to person, place, and time. He appears well-developed and well-nourished.  Pleasant, alert, overweight  HENT:  Head: Normocephalic and atraumatic.  Right Ear: External ear normal.  Nose: Nose normal.  Mouth/Throat: Oropharynx is clear and moist. No oropharyngeal exudate.  Ear cerumen left ear canal  Eyes: Conjunctivae and EOM are normal. Pupils are equal, round, and reactive to light. Right eye exhibits no discharge. Left eye exhibits no discharge. No scleral icterus.  Neck: Normal range of motion. Neck supple. No thyromegaly present.  No carotid bruits auscultated  Cardiovascular: Normal rate, regular rhythm, normal heart sounds and intact distal pulses.  Exam reveals no gallop and no friction rub.   No murmur heard. The rhythm is regular at 72 per minute  Pulmonary/Chest: Effort normal and breath  sounds normal. No respiratory distress. He has no wheezes. He has no rales. He exhibits no tenderness.  Abdominal: Soft. Bowel sounds are normal. He exhibits no mass. There is no tenderness. There is no rebound and no guarding.  The abdomen is obese with normal bowel sounds and no abdominal bruits, no inguinal nodes  Musculoskeletal: Normal range of motion. He exhibits  no edema and no tenderness.  Lymphadenopathy:    He has no cervical adenopathy.  Neurological: He is alert and oriented to person, place, and time. He has normal reflexes. No cranial nerve deficit.  Skin: Skin is warm and dry. Rash noted. No erythema. No pallor.  The patient has multiple bites to his lower ankles. These appear to be healing. None of these appear to be infected   Psychiatric: He has a normal mood and affect. His behavior is normal. Judgment and thought content normal.   BP 131/68  Pulse 65  Temp(Src) 97 F (36.1 C) (Oral)  Ht '5\' 7"'  (1.702 m)  Wt 229 lb (103.874 kg)  BMI 35.86 kg/m2        Assessment & Plan:  1. BPH (benign prostatic hyperplasia) - POCT CBC; Future  2. Diabetes mellitus type 2, uncontrolled - POCT CBC; Future - POCT glycosylated hemoglobin (Hb A1C); Future  3. Essential hypertension - POCT CBC; Future - BMP8+EGFR; Future - Hepatic function panel; Future  4. Hyperlipidemia associated with type 2 diabetes mellitus - POCT CBC; Future - NMR, lipoprofile; Future  5. Vitamin D deficiency - Vit D  25 hydroxy (rtn osteoporosis monitoring); Future  6. Left ear impacted cerumen  Meds ordered this encounter  Medications  . atorvastatin (LIPITOR) 40 MG tablet    Sig: Take 1 tablet (40 mg total) by mouth daily.    Dispense:  90 tablet    Refill:  3  . metFORMIN (GLUCOPHAGE XR) 500 MG 24 hr tablet    Sig: Take 1 tablet twice a day with breakfast and supper    Dispense:  60 tablet    Refill:  6      Patient Instructions                       Medicare Annual Wellness Visit  [facility] and the medical providers at [facility] strive to bring you the best medical care.  In doing so we not only want to address your current medical conditions and concerns but also to detect new conditions early and prevent illness, disease and health-related problems.    Medicare offers a yearly Wellness Visit which allows our clinical  staff to assess your need for preventative services including immunizations, lifestyle education, counseling to decrease risk of preventable diseases and screening for fall risk and other medical concerns.    This visit is provided free of charge (no copay) for all Medicare recipients. The clinical pharmacists at [facility] have begun to conduct these Wellness Visits which will also include a thorough review of all your medications.    As you primary medical provider recommend that you make an appointment for your Annual Wellness Visit if you have not done so already this year.  You may set up this appointment before you leave today or you may call back ([phone]) and schedule an appointment.  Please make sure when you call that you mention that you are scheduling your Annual Wellness Visit with the clinical pharmacist so that the appointment may be made for the proper  length of time.     Continue current medications. Continue good therapeutic lifestyle changes which include good diet and exercise. Fall precautions discussed with patient. If an FOBT was given today- please return it to our front desk. If you are over 28 years old - you may need Prevnar 21 or the adult Pneumonia vaccine.  Flu Shots will be available at our office starting mid- September. Please call and schedule a FLU CLINIC APPOINTMENT.   Return to office for fasting labwork - orders are in The Prevnar vaccine may make your arm sore Continue to drink plenty of fluids and keep working on giving your weight down with exercise and diet The ear drops for softening ear wax  are  Debrox--- use drops nightly to the affected ear for 3-4 nights in a row and wait one week and repeat   Arrie Senate MD

## 2014-02-27 NOTE — Patient Instructions (Addendum)
Medicare Annual Wellness Visit  [facility] and the medical providers at [facility] strive to bring you the best medical care.  In doing so we not only want to address your current medical conditions and concerns but also to detect new conditions early and prevent illness, disease and health-related problems.    Medicare offers a yearly Wellness Visit which allows our clinical staff to assess your need for preventative services including immunizations, lifestyle education, counseling to decrease risk of preventable diseases and screening for fall risk and other medical concerns.    This visit is provided free of charge (no copay) for all Medicare recipients. The clinical pharmacists at [facility] have begun to conduct these Wellness Visits which will also include a thorough review of all your medications.    As you primary medical provider recommend that you make an appointment for your Annual Wellness Visit if you have not done so already this year.  You may set up this appointment before you leave today or you may call back ([phone]) and schedule an appointment.  Please make sure when you call that you mention that you are scheduling your Annual Wellness Visit with the clinical pharmacist so that the appointment may be made for the proper length of time.     Continue current medications. Continue good therapeutic lifestyle changes which include good diet and exercise. Fall precautions discussed with patient. If an FOBT was given today- please return it to our front desk. If you are over 101 years old - you may need Prevnar 59 or the adult Pneumonia vaccine.  Flu Shots will be available at our office starting mid- September. Please call and schedule a FLU CLINIC APPOINTMENT.   Return to office for fasting labwork - orders are in The Prevnar vaccine may make your arm sore Continue to drink plenty of fluids and keep working on  giving your weight down with exercise and diet The ear drops for softening ear wax  are  Debrox--- use drops nightly to the affected ear for 3-4 nights in a row and wait one week and repeat

## 2014-03-08 ENCOUNTER — Other Ambulatory Visit (INDEPENDENT_AMBULATORY_CARE_PROVIDER_SITE_OTHER): Payer: Commercial Managed Care - HMO

## 2014-03-08 ENCOUNTER — Other Ambulatory Visit: Payer: Commercial Managed Care - HMO

## 2014-03-08 DIAGNOSIS — I10 Essential (primary) hypertension: Secondary | ICD-10-CM

## 2014-03-08 DIAGNOSIS — E559 Vitamin D deficiency, unspecified: Secondary | ICD-10-CM

## 2014-03-08 DIAGNOSIS — E1165 Type 2 diabetes mellitus with hyperglycemia: Secondary | ICD-10-CM

## 2014-03-08 DIAGNOSIS — N400 Enlarged prostate without lower urinary tract symptoms: Secondary | ICD-10-CM

## 2014-03-08 DIAGNOSIS — Z1212 Encounter for screening for malignant neoplasm of rectum: Secondary | ICD-10-CM

## 2014-03-08 DIAGNOSIS — IMO0001 Reserved for inherently not codable concepts without codable children: Secondary | ICD-10-CM

## 2014-03-08 DIAGNOSIS — IMO0002 Reserved for concepts with insufficient information to code with codable children: Secondary | ICD-10-CM

## 2014-03-08 DIAGNOSIS — E785 Hyperlipidemia, unspecified: Secondary | ICD-10-CM

## 2014-03-08 DIAGNOSIS — E1169 Type 2 diabetes mellitus with other specified complication: Secondary | ICD-10-CM

## 2014-03-08 LAB — POCT CBC
Granulocyte percent: 56.2 %G (ref 37–80)
HEMATOCRIT: 40.1 % — AB (ref 43.5–53.7)
Hemoglobin: 12.9 g/dL — AB (ref 14.1–18.1)
Lymph, poc: 2.3 (ref 0.6–3.4)
MCH, POC: 28.3 pg (ref 27–31.2)
MCHC: 32.1 g/dL (ref 31.8–35.4)
MCV: 88.2 fL (ref 80–97)
MPV: 8.4 fL (ref 0–99.8)
POC Granulocyte: 3.5 (ref 2–6.9)
POC LYMPH PERCENT: 36.9 %L (ref 10–50)
Platelet Count, POC: 192 10*3/uL (ref 142–424)
RBC: 4.5 M/uL — AB (ref 4.69–6.13)
RDW, POC: 14.4 %
WBC: 6.3 10*3/uL (ref 4.6–10.2)

## 2014-03-08 LAB — LIPID PANEL
HDL: 29 mg/dL — AB (ref 35–70)
LDL Cholesterol: 45 mg/dL
Triglycerides: 228 mg/dL — AB (ref 40–160)

## 2014-03-08 LAB — POCT GLYCOSYLATED HEMOGLOBIN (HGB A1C)

## 2014-03-08 NOTE — Progress Notes (Signed)
Patient came in for labs only.

## 2014-03-09 LAB — NMR, LIPOPROFILE
CHOLESTEROL: 120 mg/dL (ref 100–199)
HDL CHOLESTEROL BY NMR: 29 mg/dL — AB (ref >39)
HDL Particle Number: 23.1 umol/L — ABNORMAL LOW (ref >=30.5)
LDL Particle Number: 850 nmol/L (ref <1000)
LDL Size: 19.6 nm (ref >20.5)
LDLC SERPL CALC-MCNC: 45 mg/dL (ref 0–99)
LP-IR Score: 90 — ABNORMAL HIGH (ref <=45)
SMALL LDL PARTICLE NUMBER: 699 nmol/L — AB (ref <=527)
TRIGLYCERIDES BY NMR: 228 mg/dL — AB (ref 0–149)

## 2014-03-09 LAB — BMP8+EGFR
BUN/Creatinine Ratio: 13 (ref 10–22)
BUN: 15 mg/dL (ref 8–27)
CALCIUM: 9.2 mg/dL (ref 8.6–10.2)
CO2: 26 mmol/L (ref 18–29)
CREATININE: 1.17 mg/dL (ref 0.76–1.27)
Chloride: 99 mmol/L (ref 97–108)
GFR calc Af Amer: 74 mL/min/{1.73_m2} (ref >59)
GFR, EST NON AFRICAN AMERICAN: 64 mL/min/{1.73_m2} (ref >59)
Glucose: 161 mg/dL — ABNORMAL HIGH (ref 65–99)
Potassium: 4.6 mmol/L (ref 3.5–5.2)
SODIUM: 140 mmol/L (ref 134–144)

## 2014-03-09 LAB — HEPATIC FUNCTION PANEL
ALBUMIN: 3.9 g/dL (ref 3.6–4.8)
ALT: 28 IU/L (ref 0–44)
AST: 35 IU/L (ref 0–40)
Alkaline Phosphatase: 104 IU/L (ref 39–117)
BILIRUBIN TOTAL: 0.4 mg/dL (ref 0.0–1.2)
Bilirubin, Direct: 0.14 mg/dL (ref 0.00–0.40)
TOTAL PROTEIN: 6.8 g/dL (ref 6.0–8.5)

## 2014-03-09 LAB — VITAMIN D 25 HYDROXY (VIT D DEFICIENCY, FRACTURES): Vit D, 25-Hydroxy: 46.1 ng/mL (ref 30.0–100.0)

## 2014-03-10 LAB — FECAL OCCULT BLOOD, IMMUNOCHEMICAL: Fecal Occult Bld: POSITIVE — AB

## 2014-03-27 ENCOUNTER — Other Ambulatory Visit: Payer: Commercial Managed Care - HMO

## 2014-03-27 DIAGNOSIS — Z1212 Encounter for screening for malignant neoplasm of rectum: Secondary | ICD-10-CM

## 2014-03-27 NOTE — Progress Notes (Signed)
Lab only 

## 2014-03-28 LAB — FECAL OCCULT BLOOD, IMMUNOCHEMICAL: FECAL OCCULT BLD: NEGATIVE

## 2014-03-29 ENCOUNTER — Telehealth: Payer: Self-pay | Admitting: Family Medicine

## 2014-03-29 NOTE — Telephone Encounter (Signed)
Message copied by Waverly Ferrari on Wed Mar 29, 2014  9:48 AM ------      Message from: Chipper Herb      Created: Wed Mar 29, 2014  7:07 AM       The FOBT is negative this time. Please wait 1 month and repeat ------

## 2014-03-29 NOTE — Telephone Encounter (Signed)
Patient aware.

## 2014-04-07 ENCOUNTER — Encounter: Payer: Self-pay | Admitting: Pharmacist

## 2014-04-07 ENCOUNTER — Ambulatory Visit (INDEPENDENT_AMBULATORY_CARE_PROVIDER_SITE_OTHER): Payer: Commercial Managed Care - HMO | Admitting: Pharmacist

## 2014-04-07 VITALS — BP 120/68 | HR 70 | Ht 67.0 in | Wt 226.5 lb

## 2014-04-07 DIAGNOSIS — E1165 Type 2 diabetes mellitus with hyperglycemia: Secondary | ICD-10-CM

## 2014-04-07 DIAGNOSIS — Z23 Encounter for immunization: Secondary | ICD-10-CM

## 2014-04-07 DIAGNOSIS — C61 Malignant neoplasm of prostate: Secondary | ICD-10-CM | POA: Insufficient documentation

## 2014-04-07 DIAGNOSIS — Z8546 Personal history of malignant neoplasm of prostate: Secondary | ICD-10-CM

## 2014-04-07 DIAGNOSIS — Z720 Tobacco use: Secondary | ICD-10-CM

## 2014-04-07 DIAGNOSIS — IMO0002 Reserved for concepts with insufficient information to code with codable children: Secondary | ICD-10-CM

## 2014-04-07 DIAGNOSIS — Z0000 Encounter for general adult medical examination without abnormal findings: Secondary | ICD-10-CM

## 2014-04-07 DIAGNOSIS — C775 Secondary and unspecified malignant neoplasm of intrapelvic lymph nodes: Secondary | ICD-10-CM | POA: Insufficient documentation

## 2014-04-07 MED ORDER — METFORMIN HCL ER 500 MG PO TB24: ORAL_TABLET | ORAL | Status: DC

## 2014-04-07 MED ORDER — TAMSULOSIN HCL 0.4 MG PO CAPS: 0.4000 mg | ORAL_CAPSULE | Freq: Every day | ORAL | Status: DC
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
## 2014-04-07 NOTE — Patient Instructions (Signed)
Health Maintenance Summary    INFLUENZA VACCINE Done today 04/07/2014      TETANUS/TDAP Postponed 06/29/2014 Cost verified today $61.07    ZOSTAVAX - shingles vaccine Postponed 02/28/2015 Cost verified today $45    Diabetic Eye EXAM Next Due 08/10/2014      COLON CANCER SCREENING ANNUAL FOBT Next Due 08/11/2014      HEMOGLOBIN A1C Next Due 09/06/2014      URINE MICROALBUMIN Next Due 10/25/2014      FOOT EXAM Next Due 02/28/2015     Pneumonia Vaccine Completed     Bone Density / DEXA Referral for  Appointment made     COLONOSCOPY Next Due 08/12/2023         Preventive Care for Adults A healthy lifestyle and preventive care can promote health and wellness. Preventive health guidelines for men include the following key practices:  A routine yearly physical is a good way to check with your health care provider about your health and preventative screening. It is a chance to share any concerns and updates on your health and to receive a thorough exam.  Visit your dentist for a routine exam and preventative care every 6 months. Brush your teeth twice a day and floss once a day. Good oral hygiene prevents tooth decay and gum disease.  The frequency of eye exams is based on your age, health, family medical history, use of contact lenses, and other factors. Follow your health care provider's recommendations for frequency of eye exams.  Eat a healthy diet. Foods such as vegetables, fruits, whole grains, low-fat dairy products, and lean protein foods contain the nutrients you need without too many calories. Decrease your intake of foods high in solid fats, added sugars, and salt. Eat the right amount of calories for you.Get information about a proper diet from your health care provider, if necessary.  Regular physical exercise is one of the most important things you can do for your health. Most adults should get at least 150 minutes of moderate-intensity exercise (any activity that increases your heart rate  and causes you to sweat) each week. In addition, most adults need muscle-strengthening exercises on 2 or more days a week.  Maintain a healthy weight. The body mass index (BMI) is a screening tool to identify possible weight problems. It provides an estimate of body fat based on height and weight. Your health care provider can find your BMI and can help you achieve or maintain a healthy weight.For adults 20 years and older:  A BMI below 18.5 is considered underweight.  A BMI of 18.5 to 24.9 is normal.  A BMI of 25 to 29.9 is considered overweight.  A BMI of 30 and above is considered obese.  Maintain normal blood lipids and cholesterol levels by exercising and minimizing your intake of saturated fat. Eat a balanced diet with plenty of fruit and vegetables. Blood tests for lipids and cholesterol should begin at age 78 and be repeated every 5 years. If your lipid or cholesterol levels are high, you are over 50, or you are at high risk for heart disease, you may need your cholesterol levels checked more frequently.Ongoing high lipid and cholesterol levels should be treated with medicines if diet and exercise are not working.  If you smoke, find out from your health care provider how to quit. If you do not use tobacco, do not start.  Lung cancer screening is recommended for adults aged 35-80 years who are at high risk for developing lung cancer  because of a history of smoking. A yearly low-dose CT scan of the lungs is recommended for people who have at least a 30-pack-year history of smoking and are a current smoker or have quit within the past 15 years. A pack year of smoking is smoking an average of 1 pack of cigarettes a day for 1 year (for example: 1 pack a day for 30 years or 2 packs a day for 15 years). Yearly screening should continue until the smoker has stopped smoking for at least 15 years. Yearly screening should be stopped for people who develop a health problem that would prevent them from  having lung cancer treatment.  If you choose to drink alcohol, do not have more than 2 drinks per day. One drink is considered to be 12 ounces (355 mL) of beer, 5 ounces (148 mL) of wine, or 1.5 ounces (44 mL) of liquor.  Avoid use of street drugs. Do not share needles with anyone. Ask for help if you need support or instructions about stopping the use of drugs.  High blood pressure causes heart disease and increases the risk of stroke. Your blood pressure should be checked at least every 1-2 years. Ongoing high blood pressure should be treated with medicines, if weight loss and exercise are not effective.  If you are 38-37 years old, ask your health care provider if you should take aspirin to prevent heart disease.  Diabetes screening involves taking a blood sample to check your fasting blood sugar level. This should be done once every 3 years, after age 88, if you are within normal weight and without risk factors for diabetes. Testing should be considered at a younger age or be carried out more frequently if you are overweight and have at least 1 risk factor for diabetes.  Colorectal cancer can be detected and often prevented. Most routine colorectal cancer screening begins at the age of 35 and continues through age 72. However, your health care provider may recommend screening at an earlier age if you have risk factors for colon cancer. On a yearly basis, your health care provider may provide home test kits to check for hidden blood in the stool. Use of a small camera at the end of a tube to directly examine the colon (sigmoidoscopy or colonoscopy) can detect the earliest forms of colorectal cancer. Talk to your health care provider about this at age 51, when routine screening begins. Direct exam of the colon should be repeated every 5-10 years through age 37, unless early forms of precancerous polyps or small growths are found.  People who are at an increased risk for hepatitis B should be screened  for this virus. You are considered at high risk for hepatitis B if:  You were born in a country where hepatitis B occurs often. Talk with your health care provider about which countries are considered high risk.  Your parents were born in a high-risk country and you have not received a shot to protect against hepatitis B (hepatitis B vaccine).  You have HIV or AIDS.  You use needles to inject street drugs.  You live with, or have sex with, someone who has hepatitis B.  You are a man who has sex with other men (MSM).  You get hemodialysis treatment.  You take certain medicines for conditions such as cancer, organ transplantation, and autoimmune conditions.  Hepatitis C blood testing is recommended for all people born from [birth year] through 1965 and any individual with known risks for hepatitis  C.  Practice safe sex. Use condoms and avoid high-risk sexual practices to reduce the spread of sexually transmitted infections (STIs). STIs include gonorrhea, chlamydia, syphilis, trichomonas, herpes, HPV, and human immunodeficiency virus (HIV). Herpes, HIV, and HPV are viral illnesses that have no cure. They can result in disability, cancer, and death.  If you are at risk of being infected with HIV, it is recommended that you take a prescription medicine daily to prevent HIV infection. This is called preexposure prophylaxis (PrEP). You are considered at risk if:  You are a man who has sex with other men (MSM) and have other risk factors.  You are a heterosexual man, are sexually active, and are at increased risk for HIV infection.  You take drugs by injection.  You are sexually active with a partner who has HIV.  Talk with your health care provider about whether you are at high risk of being infected with HIV. If you choose to begin PrEP, you should first be tested for HIV. You should then be tested every 3 months for as long as you are taking PrEP.  A one-time screening for abdominal aortic  aneurysm (AAA) and surgical repair of large AAAs by ultrasound are recommended for men ages 1 to 71 years who are current or former smokers.  Healthy men should no longer receive prostate-specific antigen (PSA) blood tests as part of routine cancer screening. Talk with your health care provider about prostate cancer screening.  Testicular cancer screening is not recommended for adult males who have no symptoms. Screening includes self-exam, a health care provider exam, and other screening tests. Consult with your health care provider about any symptoms you have or any concerns you have about testicular cancer.  Use sunscreen. Apply sunscreen liberally and repeatedly throughout the day. You should seek shade when your shadow is shorter than you. Protect yourself by wearing long sleeves, pants, a wide-brimmed hat, and sunglasses year round, whenever you are outdoors.  Once a month, do a whole-body skin exam, using a mirror to look at the skin on your back. Tell your health care provider about new moles, moles that have irregular borders, moles that are larger than a pencil eraser, or moles that have changed in shape or color.  Stay current with required vaccines (immunizations).  Influenza vaccine. All adults should be immunized every year.  Tetanus, diphtheria, and acellular pertussis (Td, Tdap) vaccine. An adult who has not previously received Tdap or who does not know his vaccine status should receive 1 dose of Tdap. This initial dose should be followed by tetanus and diphtheria toxoids (Td) booster doses every 10 years. Adults with an unknown or incomplete history of completing a 3-dose immunization series with Td-containing vaccines should begin or complete a primary immunization series including a Tdap dose. Adults should receive a Td booster every 10 years.  Varicella vaccine. An adult without evidence of immunity to varicella should receive 2 doses or a second dose if he has previously  received 1 dose.  Human papillomavirus (HPV) vaccine. Males aged 46-21 years who have not received the vaccine previously should receive the 3-dose series. Males aged 22-26 years may be immunized. Immunization is recommended through the age of 55 years for any male who has sex with males and did not get any or all doses earlier. Immunization is recommended for any person with an immunocompromised condition through the age of 67 years if he did not get any or all doses earlier. During the 3-dose series, the second  dose should be obtained 4-8 weeks after the first dose. The third dose should be obtained 24 weeks after the first dose and 16 weeks after the second dose.  Zoster vaccine. One dose is recommended for adults aged 44 years or older unless certain conditions are present.  Measles, mumps, and rubella (MMR) vaccine. Adults born before [birth year] generally are considered immune to measles and mumps. Adults born in [birth year] or later should have 1 or more doses of MMR vaccine unless there is a contraindication to the vaccine or there is laboratory evidence of immunity to each of the three diseases. A routine second dose of MMR vaccine should be obtained at least 28 days after the first dose for students attending postsecondary schools, health care workers, or international travelers. People who received inactivated measles vaccine or an unknown type of measles vaccine during 1963-1967 should receive 2 doses of MMR vaccine. People who received inactivated mumps vaccine or an unknown type of mumps vaccine before 1979 and are at high risk for mumps infection should consider immunization with 2 doses of MMR vaccine. Unvaccinated health care workers born before [birth year] who lack laboratory evidence of measles, mumps, or rubella immunity or laboratory confirmation of disease should consider measles and mumps immunization with 2 doses of MMR vaccine or rubella immunization with 1 dose of MMR vaccine.  Pneumococcal 13-valent  conjugate (PCV13) vaccine. When indicated, a person who is uncertain of his immunization history and has no record of immunization should receive the PCV13 vaccine. An adult aged 26 years or older who has certain medical conditions and has not been previously immunized should receive 1 dose of PCV13 vaccine. This PCV13 should be followed with a dose of pneumococcal polysaccharide (PPSV23) vaccine. The PPSV23 vaccine dose should be obtained at least 8 weeks after the dose of PCV13 vaccine. An adult aged 47 years or older who has certain medical conditions and previously received 1 or more doses of PPSV23 vaccine should receive 1 dose of PCV13. The PCV13 vaccine dose should be obtained 1 or more years after the last PPSV23 vaccine dose.  Pneumococcal polysaccharide (PPSV23) vaccine. When PCV13 is also indicated, PCV13 should be obtained first. All adults aged 7 years and older should be immunized. An adult younger than age 24 years who has certain medical conditions should be immunized. Any person who resides in a nursing home or long-term care facility should be immunized. An adult smoker should be immunized. People with an immunocompromised condition and certain other conditions should receive both PCV13 and PPSV23 vaccines. People with human immunodeficiency virus (HIV) infection should be immunized as soon as possible after diagnosis. Immunization during chemotherapy or radiation therapy should be avoided. Routine use of PPSV23 vaccine is not recommended for American Indians, [place] Natives, or people younger than 65 years unless there are medical conditions that require PPSV23 vaccine. When indicated, people who have unknown immunization and have no record of immunization should receive PPSV23 vaccine. One-time revaccination 5 years after the first dose of PPSV23 is recommended for people aged 19-64 years who have chronic kidney failure, nephrotic syndrome, asplenia, or immunocompromised conditions. People who  received 1-2 doses of PPSV23 before age 14 years should receive another dose of PPSV23 vaccine at age 80 years or later if at least 5 years have passed since the previous dose. Doses of PPSV23 are not needed for people immunized with PPSV23 at or after age 59 years.  Meningococcal vaccine. Adults with asplenia or persistent complement component deficiencies should receive 2  doses of quadrivalent meningococcal conjugate (MenACWY-D) vaccine. The doses should be obtained at least 2 months apart. Microbiologists working with certain meningococcal bacteria, [place] recruits, people at risk during an outbreak, and people who travel to or live in countries with a high rate of meningitis should be immunized. A first-year college student up through age 77 years who is living in a residence hall should receive a dose if he did not receive a dose on or after his 16th birthday. Adults who have certain high-risk conditions should receive one or more doses of vaccine.  Hepatitis A vaccine. Adults who wish to be protected from this disease, have certain high-risk conditions, work with hepatitis A-infected animals, work in hepatitis A research labs, or travel to or work in countries with a high rate of hepatitis A should be immunized. Adults who were previously unvaccinated and who anticipate close contact with an international adoptee during the first 60 days after arrival in the [place] from a country with a high rate of hepatitis A should be immunized.  Hepatitis B vaccine. Adults should be immunized if they wish to be protected from this disease, have certain high-risk conditions, may be exposed to blood or other infectious body fluids, are household contacts or sex partners of hepatitis B positive people, are clients or workers in certain care facilities, or travel to or work in countries with a high rate of hepatitis B.  Haemophilus influenzae type b (Hib) vaccine. A previously unvaccinated person with asplenia  or sickle cell disease or having a scheduled splenectomy should receive 1 dose of Hib vaccine. Regardless of previous immunization, a recipient of a hematopoietic stem cell transplant should receive a 3-dose series 6-12 months after his successful transplant. Hib vaccine is not recommended for adults with HIV infection. Preventive Service / Frequency  Ages 60 and over  Blood pressure check.** / Every 1 to 2 years.  Lipid and cholesterol check.**/ Every 5 years beginning at age 79.  Lung cancer screening. / Every year if you are aged 5-80 years and have a 30-pack-year history of smoking and currently smoke or have quit within the past 15 years. Yearly screening is stopped once you have quit smoking for at least 15 years or develop a health problem that would prevent you from having lung cancer treatment.  Fecal occult blood test (FOBT) of stool. / Every year beginning at age 83 and continuing until age 7. You may not have to do this test if you get a colonoscopy every 10 years.  Flexible sigmoidoscopy** or colonoscopy.** / Every 5 years for a flexible sigmoidoscopy or every 10 years for a colonoscopy beginning at age 92 and continuing until age 59.  Hepatitis C blood test.** / For all people born from [birth year] through 1965 and any individual with known risks for hepatitis C.  Abdominal aortic aneurysm (AAA) screening.** / A one-time screening for ages 64 to 54 years who are current or former smokers.  Skin self-exam. / Monthly.  Influenza vaccine. / Every year.  Tetanus, diphtheria, and acellular pertussis (Tdap/Td) vaccine.** / 1 dose of Td every 10 years.  Varicella vaccine.** / Consult your health care provider.  Zoster vaccine.** / 1 dose for adults aged 24 years or older.  Pneumococcal 13-valent conjugate (PCV13) vaccine.** / Consult your health care provider.  Pneumococcal polysaccharide (PPSV23) vaccine.** / 1 dose for all adults aged 36 years and older.  Meningococcal vaccine.**  / Consult your health care provider.  Hepatitis A vaccine.** / Consult your  health care provider.  Hepatitis B vaccine.** / Consult your health care provider.  Haemophilus influenzae type b (Hib) vaccine.** / Consult your health care provider. **Family history and personal history of risk and conditions may change your health care provider's recommendations. Document Released: 08/12/2001 Document Revised: 06/21/2013 Document Reviewed: 11/11/2010 [facility] Patient Information 2015 [place], [place]. This information is not intended to replace advice given to you by your health care provider. Make sure you discuss any questions you have with your health care provider.

## 2014-04-18 ENCOUNTER — Encounter (HOSPITAL_COMMUNITY): Payer: Self-pay | Admitting: Emergency Medicine

## 2014-04-18 ENCOUNTER — Emergency Department (HOSPITAL_COMMUNITY): Payer: Medicare PPO

## 2014-04-18 ENCOUNTER — Emergency Department (HOSPITAL_COMMUNITY)
Admission: EM | Admit: 2014-04-18 | Discharge: 2014-04-18 | Disposition: A | Discharge status: Home / Self Care | Payer: Medicare PPO | Attending: Emergency Medicine | Admitting: Emergency Medicine

## 2014-04-18 DIAGNOSIS — Z8546 Personal history of malignant neoplasm of prostate: Secondary | ICD-10-CM | POA: Diagnosis not present

## 2014-04-18 DIAGNOSIS — R739 Hyperglycemia, unspecified: Secondary | ICD-10-CM

## 2014-04-18 DIAGNOSIS — I10 Essential (primary) hypertension: Secondary | ICD-10-CM | POA: Diagnosis not present

## 2014-04-18 DIAGNOSIS — Z7982 Long term (current) use of aspirin: Secondary | ICD-10-CM | POA: Insufficient documentation

## 2014-04-18 DIAGNOSIS — Z720 Tobacco use: Secondary | ICD-10-CM | POA: Insufficient documentation

## 2014-04-18 DIAGNOSIS — Z87448 Personal history of other diseases of urinary system: Secondary | ICD-10-CM | POA: Insufficient documentation

## 2014-04-18 DIAGNOSIS — Z79899 Other long term (current) drug therapy: Secondary | ICD-10-CM | POA: Diagnosis not present

## 2014-04-18 DIAGNOSIS — R079 Chest pain, unspecified: Secondary | ICD-10-CM | POA: Insufficient documentation

## 2014-04-18 DIAGNOSIS — E785 Hyperlipidemia, unspecified: Secondary | ICD-10-CM | POA: Insufficient documentation

## 2014-04-18 DIAGNOSIS — H269 Unspecified cataract: Secondary | ICD-10-CM | POA: Insufficient documentation

## 2014-04-18 DIAGNOSIS — E119 Type 2 diabetes mellitus without complications: Secondary | ICD-10-CM | POA: Insufficient documentation

## 2014-04-18 DIAGNOSIS — Z862 Personal history of diseases of the blood and blood-forming organs and certain disorders involving the immune mechanism: Secondary | ICD-10-CM | POA: Insufficient documentation

## 2014-04-18 LAB — COMPREHENSIVE METABOLIC PANEL
ALBUMIN: 3.3 g/dL — AB (ref 3.5–5.2)
ALT: 31 U/L (ref 0–53)
ANION GAP: 11 (ref 5–15)
AST: 34 U/L (ref 0–37)
Alkaline Phosphatase: 116 U/L (ref 39–117)
BILIRUBIN TOTAL: 0.4 mg/dL (ref 0.3–1.2)
BUN: 15 mg/dL (ref 6–23)
CO2: 26 mEq/L (ref 19–32)
CREATININE: 1.05 mg/dL (ref 0.50–1.35)
Calcium: 8.8 mg/dL (ref 8.4–10.5)
Chloride: 101 mEq/L (ref 96–112)
GFR calc Af Amer: 82 mL/min — ABNORMAL LOW (ref >90)
GFR calc non Af Amer: 71 mL/min — ABNORMAL LOW (ref >90)
Glucose, Bld: 246 mg/dL — ABNORMAL HIGH (ref 70–99)
Potassium: 4.1 mEq/L (ref 3.7–5.3)
Sodium: 138 mEq/L (ref 137–147)
TOTAL PROTEIN: 7.2 g/dL (ref 6.0–8.3)

## 2014-04-18 LAB — CBC WITH DIFFERENTIAL/PLATELET
BASOS PCT: 1 % (ref 0–1)
Basophils Absolute: 0 10*3/uL (ref 0.0–0.1)
EOS ABS: 0.2 10*3/uL (ref 0.0–0.7)
Eosinophils Relative: 3 % (ref 0–5)
HCT: 34 % — ABNORMAL LOW (ref 39.0–52.0)
Hemoglobin: 11.6 g/dL — ABNORMAL LOW (ref 13.0–17.0)
Lymphocytes Relative: 40 % (ref 12–46)
Lymphs Abs: 2.3 10*3/uL (ref 0.7–4.0)
MCH: 30.4 pg (ref 26.0–34.0)
MCHC: 34.1 g/dL (ref 30.0–36.0)
MCV: 89.2 fL (ref 78.0–100.0)
MONO ABS: 0.3 10*3/uL (ref 0.1–1.0)
Monocytes Relative: 5 % (ref 3–12)
NEUTROS ABS: 3 10*3/uL (ref 1.7–7.7)
NEUTROS PCT: 51 % (ref 43–77)
Platelets: 214 10*3/uL (ref 150–400)
RBC: 3.81 MIL/uL — ABNORMAL LOW (ref 4.22–5.81)
RDW: 13.7 % (ref 11.5–15.5)
WBC: 5.8 10*3/uL (ref 4.0–10.5)

## 2014-04-18 LAB — TROPONIN I

## 2014-04-18 MED ORDER — ASPIRIN 81 MG PO CHEW
324.0000 mg | CHEWABLE_TABLET | Freq: Once | ORAL | Status: AC
  Administered 2014-04-18: 324 mg via ORAL
  Filled 2014-04-18: qty 4

## 2014-04-18 NOTE — ED Notes (Signed)
MD at bedside. 

## 2014-04-18 NOTE — ED Notes (Signed)
PT c/o left sided chest pain x2 days with no SOB/dizziness/nausea/diaphoresis. PT c/o tenderness to palpate left chest wall. PT took one 81mg  aspirin at home prior to ED arrival.

## 2014-04-18 NOTE — ED Provider Notes (Signed)
CSN: [id number]     Arrival date & time 04/18/14  1128 History   First MD Initiated Contact with Patient 04/18/14 1143     Chief Complaint  Patient presents with  . Chest Pain      Patient is a 68 y.o. male presenting with chest pain. The history is provided by the patient and a significant other.  Chest Pain Pain location:  L chest Pain quality: sharp   Pain radiates to:  Does not radiate Duration: lasts several seconds. Timing:  Intermittent Progression:  Unchanged Chronicity:  New Relieved by:  None tried Worsened by:  Certain positions Associated symptoms: no fever, no lower extremity edema, no shortness of breath, not vomiting and no weakness   Pt reports for past 2 days he has had brief episodes of left sided CP.  It does not radiate It seems to worsen with movement of upper body No SOB reported at this time  Past Medical History  Diagnosis Date  . Diabetes mellitus without complication   . Hypertension   . Hyperlipidemia   . BPH (benign prostatic hyperplasia)     Sees Dr [name]  . Anemia   . Cataract   . Low serum vitamin D   . Prostate cancer     w/seed implantation and radiation   Past Surgical History  Procedure Laterality Date  . Prostate surgery  2008    seed implant   Family History  Problem Relation Age of Onset  . Colon cancer Neg Hx   . Esophageal cancer Neg Hx   . Rectal cancer Neg Hx   . Prostate cancer Neg Hx   . Diabetes Mother   . Heart disease Mother   . Kidney disease Mother     DIALYSIS  . Emphysema Father   . Deep vein thrombosis Sister   . Kidney disease Sister   . Cancer Sister     lung  . Colon polyps Brother   . Hypertension Brother   . Gout Brother    History  Substance Use Topics  . Smoking status: Current Every Day Smoker -- 1.00 packs/day    Types: Cigarettes    Start date: 06/30/1965  . Smokeless tobacco: Never Used  . Alcohol Use: No    Review of Systems  Constitutional: Negative for fever.  Respiratory:  Negative for shortness of breath.   Cardiovascular: Positive for chest pain.  Gastrointestinal: Negative for vomiting.  Neurological: Negative for weakness.  All other systems reviewed and are negative.     Allergies  Review of patient's allergies indicates no known allergies.  Home Medications   Prior to Admission medications   Medication Sig Start Date End Date Taking? Authorizing Provider  aspirin 81 MG tablet Take 81 mg by mouth daily.   Yes Historical Provider, MD  atorvastatin (LIPITOR) 40 MG tablet Take 1 tablet (40 mg total) by mouth daily. 02/27/14  Yes Chipper Herb, MD  cholecalciferol (VITAMIN D) 1000 UNITS tablet Take 2,000 Units by mouth daily.    Yes Historical Provider, MD  lisinopril-hydrochlorothiazide (PRINZIDE,ZESTORETIC) 20-25 MG per tablet Take 1 tablet by mouth daily. 12/22/13  Yes Chipper Herb, MD  metFORMIN (GLUCOPHAGE-XR) 500 MG 24 hr tablet Take 500 mg by mouth 2 (two) times daily.   Yes Historical Provider, MD  sitaGLIPtin (JANUVIA) 100 MG tablet Take 1 tablet (100 mg total) by mouth daily. 02/13/14  Yes Chipper Herb, MD  tamsulosin (FLOMAX) 0.4 MG CAPS capsule Take 1 capsule (0.4 mg total)  by mouth daily. 04/07/14  Yes Chipper Herb, MD   BP 135/74  Pulse 71  Temp(Src) 98.2 F (36.8 C) (Oral)  Resp 18  Ht 5\' 9"  (1.753 m)  Wt 225 lb (102.059 kg)  BMI 33.21 kg/m2  SpO2 98% Physical Exam CONSTITUTIONAL: Well developed/well nourished HEAD: Normocephalic/atraumatic EYES: EOMI/PERRL ENMT: Mucous membranes moist NECK: supple no meningeal signs SPINE:entire spine nontender CV: S1/S2 noted, no murmurs/rubs/gallops noted Chest- tenderness reproduced with movement of his upper chest LUNGS: Lungs are clear to auscultation bilaterally, no apparent distress ABDOMEN: soft, nontender, no rebound or guarding GU:no cva tenderness NEURO: Pt is awake/alert, moves all extremitiesx4 EXTREMITIES: pulses normal, full ROM SKIN: warm, color normal PSYCH: no  abnormalities of mood noted  ED Course  Procedures 12:04 PM Pt denies known h/o CAD He is currently well appearing and in no distress  Pt very well appearing He only has pain when he moves his upper body.  He has no other symptoms - no weakness/SOB He would like to go home.  At this point, I have low suspicion for ACS/PE/Dissection at this time We discussed strict return precautions Labs Review Labs Reviewed  CBC WITH DIFFERENTIAL - Abnormal; Notable for the following:    RBC 3.81 (*)    Hemoglobin 11.6 (*)    HCT 34.0 (*)    All other components within normal limits  COMPREHENSIVE METABOLIC PANEL - Abnormal; Notable for the following:    Glucose, Bld 246 (*)    Albumin 3.3 (*)    GFR calc non Af Amer 71 (*)    GFR calc Af Amer 82 (*)    All other components within normal limits  TROPONIN I    Imaging Review Dg Chest Portable 1 View  04/18/2014   CLINICAL DATA:  Hervey Ard live site chest pain, productive cough and shortness of breath off and on for 3 days, personal history of hypertension, diabetes, smoking, prostate cancer post seed implantation radiation therapy  EXAM: PORTABLE CHEST - 1 VIEW  COMPARISON:  Portable exam 1152 hr compared to 11/16/2012  FINDINGS: Borderline enlargement of cardiac silhouette.  Mediastinal contours and pulmonary vascularity normal.  Lungs clear.  No pleural effusion or pneumothorax.  Osseous structures unremarkable.  IMPRESSION: No acute abnormalities.   Electronically Signed   By: Lavonia Dana M.D.   On: 04/18/2014 12:18     EKG Interpretation   Date/Time:  Tuesday April 18 2014 11:38:44 EDT Ventricular Rate:  70 PR Interval:  163 QRS Duration: 88 QT Interval:  402 QTC Calculation: 434 R Axis:   81 Text Interpretation:  Sinus rhythm Borderline right axis deviation No  previous ECGs available Confirmed by Christy Gentles  MD, Elenore Rota ([id number]) on  04/18/2014 11:44:18 AM      MDM   Final diagnoses:  Chest pain, unspecified chest pain type   Hyperglycemia    Nursing notes including past medical history and social history reviewed and considered in documentation xrays reviewed and considered Labs/vital reviewed and considered Previous records reviewed and considered     Sharyon Cable, MD 04/18/14 1539

## 2014-04-18 NOTE — Discharge Instructions (Signed)

## 2014-04-28 ENCOUNTER — Telehealth: Payer: Self-pay | Admitting: Family Medicine

## 2014-04-29 ENCOUNTER — Other Ambulatory Visit: Payer: Self-pay | Admitting: *Deleted

## 2014-04-29 MED ORDER — LISINOPRIL-HYDROCHLOROTHIAZIDE 20-25 MG PO TABS: 1.0000 | ORAL_TABLET | Freq: Every day | ORAL | Status: DC

## 2014-04-29 NOTE — Telephone Encounter (Signed)
Called to [facility]. Patient notified 

## 2014-05-12 ENCOUNTER — Telehealth: Payer: Self-pay | Admitting: Family Medicine

## 2014-05-12 NOTE — Telephone Encounter (Signed)
Patient aware no janivia samples available told to check back next week

## 2014-05-17 ENCOUNTER — Other Ambulatory Visit: Payer: Self-pay | Admitting: Family Medicine

## 2014-05-17 NOTE — Telephone Encounter (Signed)
Called patient to let him know that we do not have Januvia samples at this time.

## 2014-05-18 ENCOUNTER — Telehealth: Payer: Self-pay | Admitting: Pharmacist

## 2014-05-18 MED ORDER — SITAGLIPTIN PHOSPHATE 100 MG PO TABS: 100.0000 mg | ORAL_TABLET | Freq: Every day | ORAL | Status: DC

## 2014-05-18 NOTE — Telephone Encounter (Signed)
#  28 samples left at front desk.  Trying to continue until next A1c due to see how effective Januvia has been.  Patient aware.

## 2014-07-03 ENCOUNTER — Other Ambulatory Visit: Payer: Self-pay | Admitting: Family Medicine

## 2014-07-03 ENCOUNTER — Telehealth: Payer: Self-pay | Admitting: Family Medicine

## 2014-07-03 MED ORDER — SITAGLIPTIN PHOSPHATE 100 MG PO TABS: 100.0000 mg | ORAL_TABLET | Freq: Every day | ORAL | Status: DC

## 2014-07-03 NOTE — Telephone Encounter (Signed)
Aware,no Celesta Gentile samples.

## 2014-07-03 NOTE — Telephone Encounter (Signed)
done

## 2014-07-04 ENCOUNTER — Telehealth: Payer: Self-pay | Admitting: Family Medicine

## 2014-07-06 MED ORDER — SITAGLIPTIN PHOSPHATE 100 MG PO TABS: 100.0000 mg | ORAL_TABLET | Freq: Every day | ORAL | Status: DC

## 2014-07-06 NOTE — Telephone Encounter (Signed)
Pt was called and aware that we have samples ready for him up front

## 2014-07-12 ENCOUNTER — Ambulatory Visit: Payer: Commercial Managed Care - HMO

## 2014-07-12 ENCOUNTER — Other Ambulatory Visit: Payer: Commercial Managed Care - HMO

## 2014-07-12 ENCOUNTER — Encounter: Payer: Self-pay | Admitting: Family Medicine

## 2014-07-12 ENCOUNTER — Ambulatory Visit (INDEPENDENT_AMBULATORY_CARE_PROVIDER_SITE_OTHER): Payer: Commercial Managed Care - HMO | Admitting: Family Medicine

## 2014-07-12 VITALS — BP 137/69 | HR 85 | Temp 97.2°F | Ht 69.0 in | Wt 229.0 lb

## 2014-07-12 DIAGNOSIS — Z8546 Personal history of malignant neoplasm of prostate: Secondary | ICD-10-CM

## 2014-07-12 DIAGNOSIS — N400 Enlarged prostate without lower urinary tract symptoms: Secondary | ICD-10-CM

## 2014-07-12 DIAGNOSIS — E1169 Type 2 diabetes mellitus with other specified complication: Secondary | ICD-10-CM | POA: Diagnosis not present

## 2014-07-12 DIAGNOSIS — E1165 Type 2 diabetes mellitus with hyperglycemia: Secondary | ICD-10-CM

## 2014-07-12 DIAGNOSIS — E785 Hyperlipidemia, unspecified: Secondary | ICD-10-CM | POA: Diagnosis not present

## 2014-07-12 DIAGNOSIS — I10 Essential (primary) hypertension: Secondary | ICD-10-CM

## 2014-07-12 DIAGNOSIS — H6122 Impacted cerumen, left ear: Secondary | ICD-10-CM | POA: Diagnosis not present

## 2014-07-12 DIAGNOSIS — E559 Vitamin D deficiency, unspecified: Secondary | ICD-10-CM | POA: Diagnosis not present

## 2014-07-12 DIAGNOSIS — IMO0002 Reserved for concepts with insufficient information to code with codable children: Secondary | ICD-10-CM

## 2014-07-12 LAB — POCT CBC
GRANULOCYTE PERCENT: 56.4 % (ref 37–80)
HCT, POC: 38.1 % — AB (ref 43.5–53.7)
Hemoglobin: 12.2 g/dL — AB (ref 14.1–18.1)
Lymph, poc: 2.3 (ref 0.6–3.4)
MCH: 28.3 pg (ref 27–31.2)
MCHC: 32.1 g/dL (ref 31.8–35.4)
MCV: 88.1 fL (ref 80–97)
MPV: 7.6 fL (ref 0–99.8)
POC Granulocyte: 3.6 (ref 2–6.9)
POC LYMPH PERCENT: 36.9 %L (ref 10–50)
Platelet Count, POC: 203 10*3/uL (ref 142–424)
RBC: 4.3 M/uL — AB (ref 4.69–6.13)
RDW, POC: 14.2 %
WBC: 6.3 10*3/uL (ref 4.6–10.2)

## 2014-07-12 LAB — POCT GLYCOSYLATED HEMOGLOBIN (HGB A1C): Hemoglobin A1C: 8.6

## 2014-07-12 MED ORDER — LISINOPRIL-HYDROCHLOROTHIAZIDE 20-25 MG PO TABS: 1.0000 | ORAL_TABLET | Freq: Every day | ORAL | Status: DC

## 2014-07-12 NOTE — Progress Notes (Signed)
Subjective:    Patient ID: Andrew [name], male    DOB: February 14, 1946, 69 y.o.   MRN: [id number]  HPI Pt here for follow up and management of chronic medical problems. The patient continues to take his Januvia metformin vitamin D atorvastatin and lisinopril HCTZ. He is also taking Flomax from the urologist. He is due to have a rectal exam because of his history of prostate cancer from his urologist and he is asked to call and set up that visit. The patient does bring in blood sugars for review and these are somewhat elevated in the 200 range. He was out of Januvia for a couple weeks and these blood sugars reflect that lack of medication. He has gained 4 pounds of weight since his last visit. He is requesting a refill on his lisinopril and will get lab work today.        Patient Active Problem List   Diagnosis Date Noted  . History of prostate cancer 04/07/2014  . Diabetes mellitus type 2, uncontrolled 09/14/2010  . BPH (benign prostatic hyperplasia) 09/14/2010  . HTN (hypertension) 09/14/2010  . Hyperlipidemia associated with type 2 diabetes mellitus 09/14/2010  . Tobacco abuse 09/14/2010   Outpatient Encounter Prescriptions as of 07/12/2014  Medication Sig  . aspirin 81 MG tablet Take 81 mg by mouth daily.  Marland Kitchen atorvastatin (LIPITOR) 40 MG tablet Take 1 tablet (40 mg total) by mouth daily.  . cholecalciferol (VITAMIN D) 1000 UNITS tablet Take 2,000 Units by mouth daily.   Marland Kitchen lisinopril-hydrochlorothiazide (PRINZIDE,ZESTORETIC) 20-25 MG per tablet Take 1 tablet by mouth daily.  . metFORMIN (GLUCOPHAGE-XR) 500 MG 24 hr tablet Take 500 mg by mouth 2 (two) times daily.  . sitaGLIPtin (JANUVIA) 100 MG tablet Take 1 tablet (100 mg total) by mouth daily.  . tamsulosin (FLOMAX) 0.4 MG CAPS capsule Take 1 capsule (0.4 mg total) by mouth daily.    Review of Systems  Constitutional: Negative.   HENT: Negative.   Eyes: Negative.   Respiratory: Negative.   Cardiovascular: Negative.     Gastrointestinal: Negative.   Endocrine: Negative.   Genitourinary: Negative.   Musculoskeletal: Negative.   Skin: Negative.   Allergic/Immunologic: Negative.   Neurological: Negative.   Hematological: Negative.   Psychiatric/Behavioral: Negative.        Objective:   Physical Exam  Constitutional: He is oriented to person, place, and time. He appears well-developed and well-nourished. No distress.  Patient is pleasant and alert and overweight.  HENT:  Head: Normocephalic and atraumatic.  Right Ear: External ear normal.  Nose: Nose normal.  Mouth/Throat: Oropharynx is clear and moist. No oropharyngeal exudate.  The left ear is impacted with ears cerumen however the eardrum is visualized and  appears normal  Eyes: Conjunctivae and EOM are normal. Pupils are equal, round, and reactive to light. Right eye exhibits no discharge. Left eye exhibits no discharge. No scleral icterus.  Neck: Normal range of motion. Neck supple. No thyromegaly present.  No carotid bruits or thyromegaly  Cardiovascular: Normal rate, regular rhythm, normal heart sounds and intact distal pulses.   No murmur heard. The heart rhythm is 72/m  Pulmonary/Chest: Effort normal and breath sounds normal. No respiratory distress. He has no wheezes. He has no rales. He exhibits no tenderness.  Lungs are clear anteriorly and posteriorly and there is no axillary adenopathy. The patient has large breast tissue.  Abdominal: Soft. Bowel sounds are normal. He exhibits no mass. There is no tenderness. There is no rebound and no  guarding.  The abdomen is obese without masses or tenderness  Genitourinary:  The patient wears a depends because of his incontinence secondary to his prostate surgery.  Musculoskeletal: Normal range of motion. He exhibits no edema or tenderness.  The patient has flat feet and bunions bilaterally.  Lymphadenopathy:    He has no cervical adenopathy.  Neurological: He is alert and oriented to person,  place, and time. He has normal reflexes. No cranial nerve deficit.  Skin: Skin is warm and dry. No rash noted. No erythema. No pallor.  There are calluses on both great toes and he has thickened nails on most of the toes. There are also calluses on both heels. Because of these foot findings and a diabetic I would certainly recommend that he get some diabetic shoes and a prescription will be given to him for this today.  Psychiatric: He has a normal mood and affect. His behavior is normal. Judgment and thought content normal.  Nursing note and vitals reviewed.  BP 137/69 mmHg  Pulse 85  Temp(Src) 97.2 F (36.2 C) (Oral)  Ht '5\' 9"'  (1.753 m)  Wt 229 lb (103.874 kg)  BMI 33.80 kg/m2        Assessment & Plan:  1. Diabetes mellitus type 2, uncontrolled -Resume Januvia and also try to get back into some exercise regimen and lose weight that has accumulated over the past several months. - POCT glycosylated hemoglobin (Hb A1C) - POCT CBC - DME Other see comment  2. BPH (benign prostatic hyperplasia) -Call and arrange this appointment for urology - Ambulatory referral to Urology - POCT CBC  3. History of prostate cancer -Arrange appointment with urology - Ambulatory referral to Urology - POCT CBC  4. Vitamin D deficiency -Continue current treatment and any change in treatment will be determined by lab work that will be done today - POCT CBC - Vit D  25 hydroxy (rtn osteoporosis monitoring)  5. Hyperlipidemia associated with type 2 diabetes mellitus -Continue current treatment and changes in medication will be determined by lab work results today - POCT CBC - NMR, lipoprofile  6. Essential hypertension -Continue current treatment - POCT CBC - BMP8+EGFR - Hepatic function panel  7. Left ear impacted cerumen -Purchase eardrops for ear cerumen over-the-counter and use these into the left ear canal to help soften earwax  Meds ordered this encounter  Medications  .  lisinopril-hydrochlorothiazide (PRINZIDE,ZESTORETIC) 20-25 MG per tablet    Sig: Take 1 tablet by mouth daily.    Dispense:  90 tablet    Refill:  3   Patient Instructions                       Medicare Annual Wellness Visit  [facility] and the medical providers at [facility] strive to bring you the best medical care.  In doing so we not only want to address your current medical conditions and concerns but also to detect new conditions early and prevent illness, disease and health-related problems.    Medicare offers a yearly Wellness Visit which allows our clinical staff to assess your need for preventative services including immunizations, lifestyle education, counseling to decrease risk of preventable diseases and screening for fall risk and other medical concerns.    This visit is provided free of charge (no copay) for all Medicare recipients. The clinical pharmacists at [facility] have begun to conduct these Wellness Visits which will also include a thorough review of all  your medications.    As you primary medical provider recommend that you make an appointment for your Annual Wellness Visit if you have not done so already this year.  You may set up this appointment before you leave today or you may call back ([phone]) and schedule an appointment.  Please make sure when you call that you mention that you are scheduling your Annual Wellness Visit with the clinical pharmacist so that the appointment may be made for the proper length of time.     Continue current medications. Continue good therapeutic lifestyle changes which include good diet and exercise. Fall precautions discussed with patient. If an FOBT was given today- please return it to our front desk. If you are over 76 years old - you may need Prevnar 68 or the adult Pneumonia vaccine.  Flu Shots will be available at our office starting mid- September. Please call and schedule a FLU  CLINIC APPOINTMENT.   Try to resume your exercise pattern and go back to the [facility].  Drink more water Try to lose some weight Take medication as regularly as possible Be sure and call and make an appointment with your urologist for follow-up of prostate cancer. We will call you with the results of the lab work as soon as those results become available Continue to monitor blood sugars closely and check your feet Take your prescription for diabetic shoes to the recommended location to get the process started Tennis Must Brox over-the-counter can help soften earwax   Arrie Senate MD

## 2014-07-12 NOTE — Patient Instructions (Addendum)
Medicare Annual Wellness Visit  [facility] and the medical providers at [facility] strive to bring you the best medical care.  In doing so we not only want to address your current medical conditions and concerns but also to detect new conditions early and prevent illness, disease and health-related problems.    Medicare offers a yearly Wellness Visit which allows our clinical staff to assess your need for preventative services including immunizations, lifestyle education, counseling to decrease risk of preventable diseases and screening for fall risk and other medical concerns.    This visit is provided free of charge (no copay) for all Medicare recipients. The clinical pharmacists at [facility] have begun to conduct these Wellness Visits which will also include a thorough review of all your medications.    As you primary medical provider recommend that you make an appointment for your Annual Wellness Visit if you have not done so already this year.  You may set up this appointment before you leave today or you may call back ([phone]) and schedule an appointment.  Please make sure when you call that you mention that you are scheduling your Annual Wellness Visit with the clinical pharmacist so that the appointment may be made for the proper length of time.     Continue current medications. Continue good therapeutic lifestyle changes which include good diet and exercise. Fall precautions discussed with patient. If an FOBT was given today- please return it to our front desk. If you are over 72 years old - you may need Prevnar 34 or the adult Pneumonia vaccine.  Flu Shots will be available at our office starting mid- September. Please call and schedule a FLU CLINIC APPOINTMENT.   Try to resume your exercise pattern and go back to the [facility].  Drink more water Try to lose some weight Take medication as regularly as possible Be  sure and call and make an appointment with your urologist for follow-up of prostate cancer. We will call you with the results of the lab work as soon as those results become available Continue to monitor blood sugars closely and check your feet Take your prescription for diabetic shoes to the recommended location to get the process started De Brox over-the-counter can help soften earwax

## 2014-07-13 ENCOUNTER — Telehealth: Payer: Self-pay | Admitting: *Deleted

## 2014-07-13 LAB — BMP8+EGFR
BUN / CREAT RATIO: 12 (ref 10–22)
BUN: 13 mg/dL (ref 8–27)
CALCIUM: 9 mg/dL (ref 8.6–10.2)
CO2: 25 mmol/L (ref 18–29)
CREATININE: 1.1 mg/dL (ref 0.76–1.27)
Chloride: 95 mmol/L — ABNORMAL LOW (ref 97–108)
GFR calc Af Amer: 79 mL/min/{1.73_m2} (ref >59)
GFR calc non Af Amer: 69 mL/min/{1.73_m2} (ref >59)
GLUCOSE: 346 mg/dL — AB (ref 65–99)
POTASSIUM: 4.1 mmol/L (ref 3.5–5.2)
SODIUM: 135 mmol/L (ref 134–144)

## 2014-07-13 LAB — HEPATIC FUNCTION PANEL
ALT: 33 IU/L (ref 0–44)
AST: 36 IU/L (ref 0–40)
Albumin: 4.1 g/dL (ref 3.6–4.8)
Alkaline Phosphatase: 113 IU/L (ref 39–117)
BILIRUBIN DIRECT: 0.18 mg/dL (ref 0.00–0.40)
TOTAL PROTEIN: 7 g/dL (ref 6.0–8.5)
Total Bilirubin: 0.6 mg/dL (ref 0.0–1.2)

## 2014-07-13 LAB — NMR, LIPOPROFILE
Cholesterol: 115 mg/dL (ref 100–199)
HDL Cholesterol by NMR: 28 mg/dL — ABNORMAL LOW (ref >39)
HDL Particle Number: 23 umol/L — ABNORMAL LOW (ref >=30.5)
LDL PARTICLE NUMBER: 794 nmol/L (ref <1000)
LDL Size: 20.1 nm (ref >20.5)
LDL-C: 57 mg/dL (ref 0–99)
LP-IR Score: 66 — ABNORMAL HIGH (ref <=45)
SMALL LDL PARTICLE NUMBER: 547 nmol/L — AB (ref <=527)
Triglycerides by NMR: 150 mg/dL — ABNORMAL HIGH (ref 0–149)

## 2014-07-13 LAB — VITAMIN D 25 HYDROXY (VIT D DEFICIENCY, FRACTURES): Vit D, 25-Hydroxy: 34.6 ng/mL (ref 30.0–100.0)

## 2014-07-13 NOTE — Telephone Encounter (Signed)
-----   Message from Chipper Herb, MD sent at 07/13/2014  1:41 PM EST ----- The blood sugar is elevated at 346. The creatinine, the most important kidney function test is good and within normal limits. The electrolytes including potassium are within normal limits except the chloride was slightly decreased. We are aware that the patient did not take his diabetic medicine for 2 weeks. This is been replenished and hopefully he will be back on this with better control. All liver function tests are within normal limits Cholesterol numbers with advanced lipid testing have an LDL particle number that is excellent and at goal. The LDL C is also good and at goal at 57. The triglycerides are slightly elevated at 150. Getting the blood sugar under better control will help this. Continue current medication and aggressive therapeutic lifestyle changes The vitamin D level was good and within normal limits. Continue vitamin D3, 1000, 1 daily.

## 2014-07-13 NOTE — Telephone Encounter (Signed)
Pt notified of results Verbalizes understanding 

## 2014-07-19 ENCOUNTER — Ambulatory Visit (INDEPENDENT_AMBULATORY_CARE_PROVIDER_SITE_OTHER): Payer: Commercial Managed Care - HMO | Admitting: Pharmacist

## 2014-07-19 ENCOUNTER — Encounter: Payer: Self-pay | Admitting: Pharmacist

## 2014-07-19 ENCOUNTER — Ambulatory Visit (INDEPENDENT_AMBULATORY_CARE_PROVIDER_SITE_OTHER): Payer: Commercial Managed Care - HMO

## 2014-07-19 VITALS — Ht 69.0 in | Wt 227.0 lb

## 2014-07-19 DIAGNOSIS — R7309 Other abnormal glucose: Secondary | ICD-10-CM

## 2014-07-19 DIAGNOSIS — Z794 Long term (current) use of insulin: Principal | ICD-10-CM

## 2014-07-19 DIAGNOSIS — Z8546 Personal history of malignant neoplasm of prostate: Secondary | ICD-10-CM

## 2014-07-19 DIAGNOSIS — Z13820 Encounter for screening for osteoporosis: Secondary | ICD-10-CM

## 2014-07-19 DIAGNOSIS — E119 Type 2 diabetes mellitus without complications: Secondary | ICD-10-CM

## 2014-07-19 LAB — POCT URINALYSIS DIPSTICK
BILIRUBIN UA: NEGATIVE
Ketones, UA: NEGATIVE
LEUKOCYTES UA: NEGATIVE
NITRITE UA: NEGATIVE
Protein, UA: NEGATIVE
RBC UA: NEGATIVE
Spec Grav, UA: 1.02
Urobilinogen, UA: NEGATIVE
pH, UA: 6

## 2014-07-19 LAB — GLUCOSE, POCT (MANUAL RESULT ENTRY): POC GLUCOSE: 508 mg/dL — AB (ref 70–99)

## 2014-07-19 LAB — HM DEXA SCAN

## 2014-07-19 MED ORDER — INSULIN PEN NEEDLE 32G X 5 MM MISC: Status: DC

## 2014-07-19 MED ORDER — INSULIN GLARGINE 300 UNIT/ML ~~LOC~~ SOPN: 10.0000 [IU] | PEN_INJECTOR | Freq: Every evening | SUBCUTANEOUS | Status: DC

## 2014-07-19 MED ORDER — SITAGLIPTIN PHOSPHATE 100 MG PO TABS: 100.0000 mg | ORAL_TABLET | Freq: Every day | ORAL | Status: DC

## 2014-07-19 NOTE — Patient Instructions (Signed)
Hypoglycemia °Hypoglycemia occurs when the glucose in your blood is too low. Glucose is a type of sugar that is your body's main energy source. Hormones, such as insulin and glucagon, control the level of glucose in the blood. Insulin lowers blood glucose and glucagon increases blood glucose. Having too much insulin in your blood stream, or not eating enough food containing sugar, can result in hypoglycemia. Hypoglycemia can happen to people with or without diabetes. It can develop quickly and can be a medical emergency.  °CAUSES  °· Missing or delaying meals. °· Not eating enough carbohydrates at meals. °· Taking too much diabetes medicine. °· Not timing your oral diabetes medicine or insulin doses with meals, snacks, and exercise. °· Nausea and vomiting. °· Certain medicines. °· Severe illnesses, such as hepatitis, kidney disorders, and certain eating disorders. °· Increased activity or exercise without eating something extra or adjusting medicines. °· Drinking too much alcohol. °· A nerve disorder that affects body functions like your heart rate, blood pressure, and digestion (autonomic neuropathy). °· A condition where the stomach muscles do not function properly (gastroparesis). Therefore, medicines and food may not absorb properly. °· Rarely, a tumor of the pancreas can produce too much insulin. °SYMPTOMS  °· Hunger. °· Sweating (diaphoresis). °· Change in body temperature. °· Shakiness. °· Headache. °· Anxiety. °· Lightheadedness. °· Irritability. °· Difficulty concentrating. °· Dry mouth. °· Tingling or numbness in the hands or feet. °· Restless sleep or sleep disturbances. °· Altered speech and coordination. °· Change in mental status. °· Seizures or prolonged convulsions. °· Combativeness. °· Drowsiness (lethargic). °· Weakness. °· Increased heart rate or palpitations. °· Confusion. °· Pale, gray skin color. °· Blurred or double vision. °· Fainting. °DIAGNOSIS  °A physical exam and medical history will be  performed. Your caregiver may make a diagnosis based on your symptoms. Blood tests and other lab tests may be performed to confirm a diagnosis. Once the diagnosis is made, your caregiver will see if your signs and symptoms go away once your blood glucose is raised.  °TREATMENT  °Usually, you can easily treat your hypoglycemia when you notice symptoms. °· Check your blood glucose. If it is less than 70 mg/dl, take one of the following:   °¨ 3-4 glucose tablets.   °¨ ½ cup juice.   °¨ ½ cup regular soda.   °¨ 1 cup skim milk.   °¨ ½-1 tube of glucose gel.   °¨ 5-6 hard candies.   °· Avoid high-fat drinks or food that may delay a rise in blood glucose levels. °· Do not take more than the recommended amount of sugary foods, drinks, gel, or tablets. Doing so will cause your blood glucose to go too high.   °· Wait 10-15 minutes and recheck your blood glucose. If it is still less than 70 mg/dl or below your target range, repeat treatment.   °· Eat a snack if it is more than 1 hour until your next meal.   °There may be a time when your blood glucose may go so low that you are unable to treat yourself at home when you start to notice symptoms. You may need someone to help you. You may even faint or be unable to swallow. If you cannot treat yourself, someone will need to bring you to the hospital.  °HOME CARE INSTRUCTIONS °· If you have diabetes, follow your diabetes management plan by: °¨ Taking your medicines as directed. °¨ Following your exercise plan. °¨ Following your meal plan. Do not skip meals. Eat on time. °¨ Testing your blood   glucose regularly. Check your blood glucose before and after exercise. If you exercise longer or different than usual, be sure to check blood glucose more frequently. °¨ Wearing your medical alert jewelry that says you have diabetes. °· Identify the cause of your hypoglycemia. Then, develop ways to prevent the recurrence of hypoglycemia. °· Do not take a hot bath or shower right after an  insulin shot. °· Always carry treatment with you. Glucose tablets are the easiest to carry. °· If you are going to drink alcohol, drink it only with meals. °· Tell friends or family members ways to keep you safe during a seizure. This may include removing hard or sharp objects from the area or turning you on your side. °· Maintain a healthy weight. °SEEK MEDICAL CARE IF:  °· You are having problems keeping your blood glucose in your target range. °· You are having frequent episodes of hypoglycemia. °· You feel you might be having side effects from your medicines. °· You are not sure why your blood glucose is dropping so low. °· You notice a change in vision or a new problem with your vision. °SEEK IMMEDIATE MEDICAL CARE IF:  °· Confusion develops. °· A change in mental status occurs. °· The inability to swallow develops. °· Fainting occurs. °Document Released: 06/16/2005 Document Revised: 06/21/2013 Document Reviewed: 10/13/2011 °[employer]® Patient Information ©2015 [employer]. This information is not intended to replace advice given to you by your health care provider. Make sure you discuss any questions you have with your health care provider. ° °
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
## 2014-07-27 ENCOUNTER — Ambulatory Visit (INDEPENDENT_AMBULATORY_CARE_PROVIDER_SITE_OTHER): Payer: Commercial Managed Care - HMO | Admitting: Pharmacist

## 2014-07-27 ENCOUNTER — Encounter: Payer: Self-pay | Admitting: Pharmacist

## 2014-07-27 VITALS — BP 133/70 | HR 72 | Ht 69.0 in | Wt 222.0 lb

## 2014-07-27 DIAGNOSIS — E119 Type 2 diabetes mellitus without complications: Secondary | ICD-10-CM

## 2014-07-27 DIAGNOSIS — Z8546 Personal history of malignant neoplasm of prostate: Secondary | ICD-10-CM | POA: Diagnosis not present

## 2014-07-27 DIAGNOSIS — N400 Enlarged prostate without lower urinary tract symptoms: Secondary | ICD-10-CM

## 2014-07-27 DIAGNOSIS — Z794 Long term (current) use of insulin: Principal | ICD-10-CM

## 2014-07-27 DIAGNOSIS — Z720 Tobacco use: Secondary | ICD-10-CM | POA: Diagnosis not present

## 2014-07-27 MED ORDER — GLUCOSE BLOOD VI STRP: ORAL_STRIP | Status: DC

## 2014-07-27 NOTE — Progress Notes (Signed)
Patient ID: Andrew [name], male   DOB: March 24, 1946, 69 y.o.   MRN: [id number]   HPI:  Type 2 DM uncontrolled.  Patient was here last week and started Toujeo 10 units daily.  He is tolerating well but BG is still elevated in the 300's  Diet - trying to limit CHO and sugar intake but still having times of indiscretions.    Patient continues to smoke about 1ppd - he asks if smoking causes high blood glucose.  Current Height: Height: 5\' 9"  (175.3 cm)      Current Weight: Weight: 222 lb (100.699 kg)       Ethnicity:African American   Assessment: Smoking Cessation - patient in contemplative stage Type 2 DM, uncontrolled  Recommendations: 1.  Patient was given 10 units of Humalog in office today.   Increase Toujeo to  20 units once daily in pm for the next 5 days.  If you continue to have fasting blood glucose readings in the morning over 150 then after 5 days increase Toujeo to 25 units.    Novolog give 10 units if you have blood glucose over 300.  Please call me if you have to use Novolog so I can adjust Toujeo also.   2.  Discussed increased CV risk with DM and smoking and that it is espeically recommended that diabetic patient's quit smoking.  Discussed various stop smoking treatments and aids.   3.  Reviewed BG goals and s/s of hypo and hyperglycemia. 4.  Follow up by phone in 7 days.  RTC in 1 month    Time spent counseling patient:  30 minutes   Cherre Robins, PharmD, CPP, CDE    Addendeum - patient's has history of prostate CA and his urologist is retiring.  He would like referral to new urologist - referral sent today

## 2014-07-27 NOTE — Patient Instructions (Signed)
Increase Toujeo to 20 units once a day for the next 5 days.  If you continue to have fasting blood glucose readings in the morning over 150 then after 5 days increase Toujeo to 25 units.   I have also given you Novolog - this is a quick or fast acting insulin meant to decrease blood glucose quickly when it is high.  You can give 10 units if you have blood glucose over 300.  Please call me if you have to use Novolog so I can adjust Toujeo also.  Krisann Mckenna [phone]

## 2014-08-02 ENCOUNTER — Telehealth: Payer: Self-pay | Admitting: Pharmacist

## 2014-08-02 NOTE — Telephone Encounter (Signed)
Called patient to follow up after TOujeo insulin increased to 20 units daily.  He is still getting BG over 200 but less than 300.  Increase Toujeo as planned to 25 units daily and follow up by phone in 3-5 days.

## 2014-08-04 ENCOUNTER — Telehealth: Payer: Self-pay | Admitting: Pharmacist

## 2014-08-04 NOTE — Telephone Encounter (Signed)
Patient calls today to report that BG is still in high 200's and low 300's despite dose increase of Toujeo.   Patient is instructed to take Novolog 10 units now and recheck BG in 1 hours.  Increase Toujeo 35 units qpm.  Follow up in 5 days.

## 2014-08-09 ENCOUNTER — Telehealth: Payer: Self-pay | Admitting: Pharmacist

## 2014-08-09 NOTE — Telephone Encounter (Signed)
Patient called to report recent BG readings.  He states that his BG has been in the 240's to 250's/  Patient instructed to increase Toujeo to 40 units qd.

## 2014-08-17 ENCOUNTER — Telehealth: Payer: Self-pay | Admitting: Pharmacist

## 2014-08-17 MED ORDER — INSULIN GLARGINE 300 UNIT/ML ~~LOC~~ SOPN: 50.0000 [IU] | PEN_INJECTOR | Freq: Every evening | SUBCUTANEOUS | Status: DC

## 2014-08-17 NOTE — Telephone Encounter (Signed)
BG stil elevated per patient - in 200's Increase Toujeo to 50 units once daily

## 2014-08-31 ENCOUNTER — Encounter: Payer: Self-pay | Admitting: Pharmacist

## 2014-08-31 ENCOUNTER — Ambulatory Visit (INDEPENDENT_AMBULATORY_CARE_PROVIDER_SITE_OTHER): Payer: Commercial Managed Care - HMO | Admitting: Pharmacist

## 2014-08-31 VITALS — BP 122/72 | HR 70 | Ht 69.0 in | Wt 224.0 lb

## 2014-08-31 DIAGNOSIS — E119 Type 2 diabetes mellitus without complications: Secondary | ICD-10-CM | POA: Diagnosis not present

## 2014-08-31 DIAGNOSIS — I10 Essential (primary) hypertension: Secondary | ICD-10-CM

## 2014-08-31 DIAGNOSIS — E1169 Type 2 diabetes mellitus with other specified complication: Secondary | ICD-10-CM | POA: Diagnosis not present

## 2014-08-31 DIAGNOSIS — Z794 Long term (current) use of insulin: Principal | ICD-10-CM

## 2014-08-31 DIAGNOSIS — E785 Hyperlipidemia, unspecified: Secondary | ICD-10-CM

## 2014-08-31 DIAGNOSIS — Z720 Tobacco use: Secondary | ICD-10-CM | POA: Diagnosis not present

## 2014-08-31 MED ORDER — METFORMIN HCL 1000 MG PO TABS: 1000.0000 mg | ORAL_TABLET | Freq: Two times a day (BID) | ORAL | Status: DC

## 2014-08-31 MED ORDER — NICOTINE POLACRILEX 4 MG MT GUM: CHEWING_GUM | OROMUCOSAL | Status: DC

## 2014-08-31 NOTE — Progress Notes (Signed)
Patient ID: Andrew [name], male   DOB: Dec 20, 1945, 69 y.o.   MRN: [id number]   HPI:  Type 2 DM uncontrolled.  Patient was here last month Started Toujeo end of January.  Have been gradually increase and now BG over last 7 days has been in mid 100's.  He is injecting Toujeo 50 units once daily.  Patient is checking BG 2-3 times daily.  HBG readings - 147, 153, 143 ,127, 152, 149, 205, 149, 175, 185, 190 ,244, 276, 177, 268, 272 BG in January and February was 200's to 300's  Diet - trying to limit CHO and sugar intake but still having times of indiscretions.  Patient and wife has questions about reading nutritions labels and looking at [facility] vs sugar amounts on labels  Patient continues to smoke about 1ppd - He expresses interest in nicotine gum today in office  Current Height: Height: 5\' 9"  (175.3 cm)      Current Weight: Weight: 224 lb (101.606 kg)       Filed Vitals:   08/31/14 0950  BP: 122/72  Pulse: 70   A1c = 8.6% (07/12/2014) Est Serum Creatinine = 34.27 (07/12/2014)  Assessment: Smoking Cessation - patient in contemplative stage Type 2 DM, uncontrolled but improving HTN controlled Hyperlipidemia - LDL at goal; Triglycerides slightly elevated - dietary changes discussed and better BG control should get Tg to goal  Recommendations: 1.  Increase metformin to 1000mg  BID with food      Continue Toujeo 50 units once daily in pm - patient given #1 sample in office today      Continue Januvia 100mg  1 tablet daily 2.  Reviewed CHO counting with patient and wife.  Also reviewed how to read labels focusing on serving sizes and CHO content (not sugar).   Reviewed real world example of adjusting serving size to fit in 15 to 20 gram recommendation for a snack.   3.  Reviewed benefits of smoking cessation.  Gave rx for nicotine gum and explained how to chew and park.   4.   Reviewed BG goals and s/s of hypo and hyperglycemia. 5.  Follow up by pone in 7-10 days 6.  Follow up with PCP in 2  months.   Time spent counseling patient:  40 minutes   Cherre Robins, PharmD, CPP, CDE

## 2014-08-31 NOTE — Patient Instructions (Signed)
Increase metformin 500mg  to 2 tablets with breakfast and 2 tablets with supper until receive metformin 1000mg  - then start metformin 1000mg  1 tablet with breakfast and 1 tablet with supper  Continue Toujeo 50 units once a day  Diabetes and Standards of Medical Care   Diabetes is complicated. You may find that your diabetes team includes a dietitian, nurse, diabetes educator, eye doctor, and more. To help everyone know what is going on and to help you get the care you deserve, the following schedule of care was developed to help keep you on track. Below are the tests, exams, vaccines, medicines, education, and plans you will need.  Blood Glucose Goals Prior to meals = 80 - 130 Within 2 hours of the start of a meal = less than 180  HbA1c test (goal is less than 7.0% - your last value was %) This test shows how well you have controlled your glucose over the past 2 to 3 months. It is used to see if your diabetes management plan needs to be adjusted.   It is performed at least 2 times a year if you are meeting treatment goals.  It is performed 4 times a year if therapy has changed or if you are not meeting treatment goals.  Blood pressure test  This test is performed at every routine medical visit. The goal is less than 140/90 mmHg for most people, but 130/80 mmHg in some cases. Ask your health care provider about your goal.  Dental exam  Follow up with the dentist regularly.  Eye exam  If you are diagnosed with type 1 diabetes as a child, get an exam upon reaching the age of 1 years or older and have had diabetes for 3 to 5 years. Yearly eye exams are recommended after that initial eye exam.  If you are diagnosed with type 1 diabetes as an adult, get an exam within 5 years of diagnosis and then yearly.  If you are diagnosed with type 2 diabetes, get an exam as soon as possible after the diagnosis and then yearly.  Foot care exam  Visual foot exams are performed at every routine  medical visit. The exams check for cuts, injuries, or other problems with the feet.  A comprehensive foot exam should be done yearly. This includes visual inspection as well as assessing foot pulses and testing for loss of sensation.  Check your feet nightly for cuts, injuries, or other problems with your feet. Tell your health care provider if anything is not healing.  Kidney function test (urine microalbumin)  This test is performed once a year.  Type 1 diabetes: The first test is performed 5 years after diagnosis.  Type 2 diabetes: The first test is performed at the time of diagnosis.  A serum creatinine and estimated glomerular filtration rate (eGFR) test is done once a year to assess the level of chronic kidney disease (CKD), if present.  Lipid profile (cholesterol, HDL, LDL, triglycerides)  Performed every 5 years for most people.  The goal for LDL is less than 100 mg/dL. If you are at high risk, the goal is less than 70 mg/dL.  The goal for HDL is 40 mg/dL to 50 mg/dL for men and 50 mg/dL to 60 mg/dL for women. An HDL cholesterol of 60 mg/dL or higher gives some protection against heart disease.  The goal for triglycerides is less than 150 mg/dL.  Influenza vaccine, pneumococcal vaccine, and hepatitis B vaccine  The influenza vaccine is recommended yearly.  The pneumococcal vaccine is generally given once in a lifetime. However, there are some instances when another vaccination is recommended. Check with your health care provider.  The hepatitis B vaccine is also recommended for adults with diabetes.  Diabetes self-management education  Education is recommended at diagnosis and ongoing as needed.  Treatment plan  Your treatment plan is reviewed at every medical visit.  Document Released: 04/13/2009 Document Revised: 02/16/2013 Document Reviewed: 11/16/2012 [facility] Patient Information 2014 [place].

## 2014-09-08 ENCOUNTER — Emergency Department (HOSPITAL_COMMUNITY)
Admission: EM | Admit: 2014-09-08 | Discharge: 2014-09-09 | Disposition: A | Discharge status: Home / Self Care | Payer: Commercial Managed Care - HMO | Attending: Emergency Medicine | Admitting: Emergency Medicine

## 2014-09-08 ENCOUNTER — Ambulatory Visit (INDEPENDENT_AMBULATORY_CARE_PROVIDER_SITE_OTHER): Payer: Commercial Managed Care - HMO | Admitting: Nurse Practitioner

## 2014-09-08 ENCOUNTER — Encounter: Payer: Self-pay | Admitting: Nurse Practitioner

## 2014-09-08 ENCOUNTER — Encounter (HOSPITAL_COMMUNITY): Payer: Self-pay | Admitting: *Deleted

## 2014-09-08 VITALS — BP 122/77 | HR 101 | Temp 98.3°F | Ht 69.0 in | Wt 222.0 lb

## 2014-09-08 DIAGNOSIS — I10 Essential (primary) hypertension: Secondary | ICD-10-CM | POA: Diagnosis not present

## 2014-09-08 DIAGNOSIS — Z7982 Long term (current) use of aspirin: Secondary | ICD-10-CM | POA: Diagnosis not present

## 2014-09-08 DIAGNOSIS — E785 Hyperlipidemia, unspecified: Secondary | ICD-10-CM | POA: Diagnosis not present

## 2014-09-08 DIAGNOSIS — M5442 Lumbago with sciatica, left side: Secondary | ICD-10-CM | POA: Diagnosis not present

## 2014-09-08 DIAGNOSIS — M791 Myalgia: Secondary | ICD-10-CM | POA: Insufficient documentation

## 2014-09-08 DIAGNOSIS — Z79899 Other long term (current) drug therapy: Secondary | ICD-10-CM | POA: Insufficient documentation

## 2014-09-08 DIAGNOSIS — N400 Enlarged prostate without lower urinary tract symptoms: Secondary | ICD-10-CM | POA: Insufficient documentation

## 2014-09-08 DIAGNOSIS — E119 Type 2 diabetes mellitus without complications: Secondary | ICD-10-CM | POA: Diagnosis not present

## 2014-09-08 DIAGNOSIS — M79662 Pain in left lower leg: Secondary | ICD-10-CM | POA: Diagnosis not present

## 2014-09-08 DIAGNOSIS — Z794 Long term (current) use of insulin: Secondary | ICD-10-CM | POA: Insufficient documentation

## 2014-09-08 DIAGNOSIS — H269 Unspecified cataract: Secondary | ICD-10-CM | POA: Insufficient documentation

## 2014-09-08 DIAGNOSIS — M79605 Pain in left leg: Secondary | ICD-10-CM | POA: Diagnosis present

## 2014-09-08 DIAGNOSIS — Z862 Personal history of diseases of the blood and blood-forming organs and certain disorders involving the immune mechanism: Secondary | ICD-10-CM | POA: Diagnosis not present

## 2014-09-08 DIAGNOSIS — M5416 Radiculopathy, lumbar region: Secondary | ICD-10-CM | POA: Diagnosis not present

## 2014-09-08 DIAGNOSIS — Z720 Tobacco use: Secondary | ICD-10-CM | POA: Insufficient documentation

## 2014-09-08 DIAGNOSIS — Z8546 Personal history of malignant neoplasm of prostate: Secondary | ICD-10-CM | POA: Insufficient documentation

## 2014-09-08 DIAGNOSIS — M5432 Sciatica, left side: Secondary | ICD-10-CM

## 2014-09-08 MED ORDER — PREDNISONE 50 MG PO TABS
60.0000 mg | ORAL_TABLET | Freq: Once | ORAL | Status: AC
  Administered 2014-09-09: 60 mg via ORAL
  Filled 2014-09-08 (×2): qty 1

## 2014-09-08 NOTE — Progress Notes (Addendum)
   Subjective:    Patient ID: Andrew [name], male    DOB: 02-04-1946, 69 y.o.   MRN: [id number]  HPI Patient in with his wife c/o leg pain- He said his back and left hip started hurting Sunday night and has moved to his left lower leg. No edema in leg. Describes pain as throbbing pain 4/10. Seems to hurt worse at night. Started taking aleve and then started on acetaminophen with no relief..    Review of Systems  Constitutional: Negative.   HENT: Negative.   Cardiovascular: Negative.   Genitourinary: Negative.   Neurological: Negative.   Psychiatric/Behavioral: Negative.   All other systems reviewed and are negative.      Objective:   Physical Exam  Constitutional: He is oriented to person, place, and time. He appears well-developed and well-nourished.  Cardiovascular: Normal rate, regular rhythm, normal heart sounds and intact distal pulses.   Pulses:      Dorsalis pedis pulses are 3+ on the right side, and 3+ on the left side.  Pulmonary/Chest: Effort normal and breath sounds normal.  Musculoskeletal:  Pain on palpation left calf area Mild (+) homan sign  Neurological: He is alert and oriented to person, place, and time.  Skin: Skin is warm.  Psychiatric: He has a normal mood and affect. His behavior is normal. Judgment and thought content normal.   BP 122/77 mmHg  Pulse 101  Temp(Src) 98.3 F (36.8 C) (Oral)  Ht 5\' 9"  (1.753 m)  Wt 222 lb (100.699 kg)  BMI 32.77 kg/m2        Assessment & Plan:   1. Calf pain, left    Needs doppler study for possible DVT Needs to go to ER so can be done  Mary-Margaret [name] Done, FNP    * called [facility] to let them know he was coming- they said they would not call anyone in from ultra sound to check him tonight. Tried to contact patient to tell them to take Aspirin and go to ER in morning but was unable to reach them.

## 2014-09-08 NOTE — Patient Instructions (Signed)

## 2014-09-08 NOTE — ED Notes (Signed)
Disregard these vital signs @ 2144

## 2014-09-08 NOTE — ED Notes (Signed)
Pt c/o bilateral leg achyiness and pain. Pt states the pain starts at the hip. Pt was seen at [facility] and was told to come to the ED to make sure he doesn't have any blood clots.

## 2014-09-08 NOTE — ED Provider Notes (Signed)
CSN: [id number]     Arrival date & time 09/08/14  1946 History  This chart was scribed for Rolland [name], MD by Chester Holstein, ED Scribe. This patient was seen in room APA10/APA10 and the patient's care was started at 11:47 PM.    Chief Complaint  Patient presents with  . Leg Pain    Patient is a 69 y.o. male presenting with leg pain. The history is provided by the patient. No language interpreter was used.  Leg Pain Associated symptoms: back pain (recurrent chronic)   Associated symptoms: no fever    HPI Comments: Andrew [name] is a 69 y.o. male with PMHx of DM, HLD, and HTN who presents to the Emergency Department complaining of intermittent "stinging' pain to posterior and lateral left leg with onset 5 days ago. Pt was seen at [facility] and was told to come to ED for concern of thrombosis. Pt notes pain was bilateral at onset but right leg pain has resolved. Pt notes associated left foot numbness.  Pt states pain radiates to left hip. Pt denies any aggravating factors, but notes exercise relieves the pain. Pt notes similar pain in past but reports he did not see a doctor at that time. Pt reports recurrent chronic back pain with flare up in the last week. Pt denies fever, chest pain, SOB, cough, and swelling in leg. Pt is a smoker at 1ppd, but denies EtOH use.     Pt's PCP is Dr. Laurance Flatten.   Past Medical History  Diagnosis Date  . Diabetes mellitus without complication   . Hypertension   . Hyperlipidemia   . BPH (benign prostatic hyperplasia)     Sees Dr [name]  . Anemia   . Cataract   . Low serum vitamin D   . Prostate cancer     w/seed implantation and radiation   Past Surgical History  Procedure Laterality Date  . Prostate surgery  2008    seed implant   Family History  Problem Relation Age of Onset  . Colon cancer Neg Hx   . Esophageal cancer Neg Hx   . Rectal cancer Neg Hx   . Prostate cancer Neg Hx   . Diabetes Mother   . Heart disease Mother   . Kidney disease  Mother     DIALYSIS  . Emphysema Father   . Deep vein thrombosis Sister   . Kidney disease Sister   . Cancer Sister     lung  . Colon polyps Brother   . Hypertension Brother   . Gout Brother    History  Substance Use Topics  . Smoking status: Current Every Day Smoker -- 1.00 packs/day    Types: Cigarettes    Start date: 06/30/1965  . Smokeless tobacco: Never Used  . Alcohol Use: No  lives at home Lives with spouse retired  Review of Systems  Constitutional: Negative for fever.  Respiratory: Negative for cough and shortness of breath.   Cardiovascular: Negative for leg swelling.  Musculoskeletal: Positive for myalgias, back pain (recurrent chronic) and arthralgias.  Neurological: Positive for numbness.  All other systems reviewed and are negative.     Allergies  Review of patient's allergies indicates no known allergies.  Home Medications   Prior to Admission medications   Medication Sig Start Date End Date Taking? Authorizing Provider  aspirin 81 MG tablet Take 81 mg by mouth daily.   Yes Historical Provider, MD  atorvastatin (LIPITOR) 40 MG tablet Take 1 tablet (40 mg total)  by mouth daily. 02/27/14  Yes Chipper Herb, MD  cholecalciferol (VITAMIN D) 1000 UNITS tablet Take 2,000 Units by mouth daily.    Yes Historical Provider, MD  Cinnamon 500 MG capsule Take 500 mg by mouth daily as needed (blood glucose levels).   Yes Historical Provider, MD  insulin aspart (NOVOLOG FLEXPEN) 100 UNIT/ML FlexPen Give 10 units if you have blood glucose over 300.  Please call me if you have to use Novolog so I can adjust Toujeo also.  Lynelle Smoke Eckard [phone] 07/27/14  Yes Tammy Eckard, PHARMD  Insulin Glargine (TOUJEO SOLOSTAR) 300 UNIT/ML SOPN Inject 50 Units into the skin every evening. 08/17/14  Yes Tammy Eckard, PHARMD  lisinopril-hydrochlorothiazide (PRINZIDE,ZESTORETIC) 20-25 MG per tablet Take 1 tablet by mouth daily. 07/12/14  Yes Chipper Herb, MD  metFORMIN (GLUCOPHAGE) 1000  MG tablet Take 1 tablet (1,000 mg total) by mouth 2 (two) times daily with a meal. 08/31/14  Yes Chipper Herb, MD  sitaGLIPtin (JANUVIA) 100 MG tablet Take 1 tablet (100 mg total) by mouth daily. 07/19/14  Yes Tammy Eckard, PHARMD  tamsulosin (FLOMAX) 0.4 MG CAPS capsule Take 1 capsule (0.4 mg total) by mouth daily. 04/07/14  Yes Chipper Herb, MD  glucose blood [facility] BLOOD GLUCOSE TEST) test strip Use to check BG up to tid.  Dx: E 11.64 uncontrolled type 2 DM requiring insulin 07/27/14   Chipper Herb, MD  Insulin Pen Needle (CAREFINE PEN NEEDLES) 32G X 5 MM MISC Use with insulin pen to inject insulin daily.  DX: E11.65 07/19/14   Tammy Eckard, PHARMD  naproxen (NAPROSYN) 250 MG tablet Take 1 po BID with food prn pain 09/09/14   Rolland [name], MD  nicotine polacrilex (NICORETTE) 4 MG gum Chew and pack 1 piece of gum as directed as needed for nicotine / smoking cravings Patient not taking: Reported on 09/08/2014 08/31/14   Tammy Eckard, PHARMD  orphenadrine (NORFLEX) 100 MG tablet Take 1 tablet (100 mg total) by mouth 2 (two) times daily. 09/09/14   Rolland [name], MD  predniSONE (DELTASONE) 20 MG tablet Take 3 po QD x 3d , then 2 po QD x 3d then 1 po QD x 3d 09/09/14   Rolland [name], MD   BP 117/74 mmHg  Pulse 91  Temp(Src) 99.8 F (37.7 C) (Oral)  Resp 18  Ht 6\' 3"  (1.905 m)  Wt 200 lb (90.719 kg)  BMI 25.00 kg/m2  SpO2 100%  Vital signs normal   Physical Exam  Constitutional: He is oriented to person, place, and time. He appears well-developed and well-nourished.  Non-toxic appearance. He does not appear ill. No distress.  HENT:  Head: Normocephalic and atraumatic.  Right Ear: External ear normal.  Left Ear: External ear normal.  Nose: Nose normal. No mucosal edema or rhinorrhea.  Mouth/Throat: Oropharynx is clear and moist and mucous membranes are normal. No dental abscesses or uvula swelling.  Eyes: Conjunctivae and EOM are normal. Pupils are equal, round, and reactive to light.  Neck: Normal  range of motion and full passive range of motion without pain. Neck supple.  Cardiovascular: Normal rate, regular rhythm and normal heart sounds.  Exam reveals no gallop and no friction rub.   No murmur heard. Pulmonary/Chest: Effort normal and breath sounds normal. No respiratory distress. He has no wheezes. He has no rhonchi. He has no rales. He exhibits no tenderness and no crepitus.  Abdominal: Soft. Normal appearance and bowel sounds are normal. He exhibits no distension. There is  no tenderness. There is no rebound and no guarding.  Musculoskeletal: Normal range of motion. He exhibits no edema.       Lumbar back: He exhibits tenderness (lumbosacral region, and the left sciatic notch ).  Moves all extremities well. No calf tenderness or medial thigh tenderness to palpation. ? Mild swelling of his LLE.    Neurological: He is alert and oriented to person, place, and time. He has normal strength. No cranial nerve deficit.  Skin: Skin is warm, dry and intact. No rash noted. No erythema. No pallor.  Psychiatric: He has a normal mood and affect. His speech is normal and behavior is normal. His mood appears not anxious.  Nursing note and vitals reviewed.   ED Course  Procedures (including critical care time)  Medications  predniSONE (DELTASONE) tablet 60 mg (not administered)  enoxaparin (LOVENOX) injection 90 mg (not administered)    DIAGNOSTIC STUDIES: Oxygen Saturation is 97% on room air, normal by my interpretation.    COORDINATION OF CARE: 11:56 PM Discussed treatment plan with patient at beside, the patient agrees with the plan and has no further questions at this time.  By my exam I feel patient's symptoms are explained by low back problems of sciatica. However his d-dimer was elevated. He was given Lovenox 1 mg per KG. He is scheduled to return at 9:30 morning to get a outpatient Doppler ultrasound of his leg to rule out DVT.    Labs Review Results for orders placed or performed  during the hospital encounter of 09/08/14  D-dimer, quantitative  Result Value Ref Range   D-Dimer, Quant 1.06 (H) 0.00 - 0.48 ug/mL-FEU  I-Stat Chem 8, ED  Result Value Ref Range   Sodium 140 135 - 145 mmol/L   Potassium 3.6 3.5 - 5.1 mmol/L   Chloride 99 96 - 112 mmol/L   BUN 16 6 - 23 mg/dL   Creatinine, Ser 1.00 0.50 - 1.35 mg/dL   Glucose, Bld 99 70 - 99 mg/dL   Calcium, Ion 1.17 1.13 - 1.30 mmol/L   TCO2 24 0 - 100 mmol/L   Hemoglobin 13.9 13.0 - 17.0 g/dL   HCT 41.0 39.0 - 52.0 %   Laboratory interpretation all normal except elevated d-dimer   Imaging Review No results found.   EKG Interpretation None      MDM   Final diagnoses:  Left lumbar radiculopathy  Back pain with left-sided sciatica    New Prescriptions   NAPROXEN (NAPROSYN) 250 MG TABLET    Take 1 po BID with food prn pain   ORPHENADRINE (NORFLEX) 100 MG TABLET    Take 1 tablet (100 mg total) by mouth 2 (two) times daily.   PREDNISONE (DELTASONE) 20 MG TABLET    Take 3 po QD x 3d , then 2 po QD x 3d then 1 po QD x 3d    Plan discharge  Rolland [name], MD, FACEP   I personally performed the services described in this documentation, which was scribed in my presence. The recorded information has been reviewed and considered.  Rolland [name], MD, Barbette Or, MD 09/09/14 [phone]

## 2014-09-09 ENCOUNTER — Ambulatory Visit (HOSPITAL_COMMUNITY)
Admit: 2014-09-09 | Discharge: 2014-09-09 | Disposition: A | Discharge status: Home / Self Care | Payer: Commercial Managed Care - HMO | Attending: Emergency Medicine | Admitting: Emergency Medicine

## 2014-09-09 ENCOUNTER — Other Ambulatory Visit (HOSPITAL_COMMUNITY): Payer: Self-pay | Admitting: Emergency Medicine

## 2014-09-09 ENCOUNTER — Other Ambulatory Visit (HOSPITAL_COMMUNITY): Payer: Medicare PPO

## 2014-09-09 DIAGNOSIS — M5442 Lumbago with sciatica, left side: Secondary | ICD-10-CM | POA: Diagnosis not present

## 2014-09-09 DIAGNOSIS — E785 Hyperlipidemia, unspecified: Secondary | ICD-10-CM | POA: Diagnosis not present

## 2014-09-09 DIAGNOSIS — M7989 Other specified soft tissue disorders: Secondary | ICD-10-CM

## 2014-09-09 DIAGNOSIS — N400 Enlarged prostate without lower urinary tract symptoms: Secondary | ICD-10-CM | POA: Diagnosis not present

## 2014-09-09 DIAGNOSIS — I10 Essential (primary) hypertension: Secondary | ICD-10-CM | POA: Diagnosis not present

## 2014-09-09 DIAGNOSIS — Z720 Tobacco use: Secondary | ICD-10-CM | POA: Diagnosis not present

## 2014-09-09 DIAGNOSIS — M5416 Radiculopathy, lumbar region: Secondary | ICD-10-CM | POA: Diagnosis not present

## 2014-09-09 DIAGNOSIS — E119 Type 2 diabetes mellitus without complications: Secondary | ICD-10-CM | POA: Diagnosis not present

## 2014-09-09 DIAGNOSIS — H269 Unspecified cataract: Secondary | ICD-10-CM | POA: Diagnosis not present

## 2014-09-09 DIAGNOSIS — M791 Myalgia: Secondary | ICD-10-CM | POA: Diagnosis not present

## 2014-09-09 LAB — I-STAT CHEM 8, ED
BUN: 16 mg/dL (ref 6–23)
Calcium, Ion: 1.17 mmol/L (ref 1.13–1.30)
Chloride: 99 mmol/L (ref 96–112)
Creatinine, Ser: 1 mg/dL (ref 0.50–1.35)
Glucose, Bld: 99 mg/dL (ref 70–99)
HCT: 41 % (ref 39.0–52.0)
Hemoglobin: 13.9 g/dL (ref 13.0–17.0)
Potassium: 3.6 mmol/L (ref 3.5–5.1)
Sodium: 140 mmol/L (ref 135–145)
TCO2: 24 mmol/L (ref 0–100)

## 2014-09-09 LAB — D-DIMER, QUANTITATIVE: D-Dimer, Quant: 1.06 ug/mL-FEU — ABNORMAL HIGH (ref 0.00–0.48)

## 2014-09-09 MED ORDER — PREDNISONE 20 MG PO TABS: ORAL_TABLET | ORAL | Status: DC

## 2014-09-09 MED ORDER — ENOXAPARIN SODIUM 100 MG/ML ~~LOC~~ SOLN
90.0000 mg | Freq: Once | SUBCUTANEOUS | Status: AC
  Administered 2014-09-09: 90 mg via SUBCUTANEOUS
  Filled 2014-09-09: qty 1

## 2014-09-09 MED ORDER — ORPHENADRINE CITRATE ER 100 MG PO TB12: 100.0000 mg | ORAL_TABLET | Freq: Two times a day (BID) | ORAL | Status: DC

## 2014-09-09 MED ORDER — NAPROXEN 250 MG PO TABS: ORAL_TABLET | ORAL | Status: DC

## 2014-09-09 NOTE — ED Provider Notes (Signed)
Discussed left venous Doppler with patient and his wife. No DVT noted. Patient will follow-up with his primary care doctor.  Nat Christen, MD 09/09/14 1209

## 2014-09-09 NOTE — Discharge Instructions (Signed)
Return in the morning at 9:30 am to get your doppler ultrasound done on your left leg. If you have a blood clot the ED doctor tomorrow will discuss with you any new medications you might need. Take the medications as prescribed for your back pain. I suspect your leg pain is from sciatica from your back. Follow up with your primary care doctor if your test later today is normal if you back pain and sciatica isn't improving.     Sciatica Sciatica is pain, weakness, numbness, or tingling along the path of the sciatic nerve. The nerve starts in the lower back and runs down the back of each leg. The nerve controls the muscles in the lower leg and in the back of the knee, while also providing sensation to the back of the thigh, lower leg, and the sole of your foot. Sciatica is a symptom of another medical condition. For instance, nerve damage or certain conditions, such as a herniated disk or bone spur on the spine, pinch or put pressure on the sciatic nerve. This causes the pain, weakness, or other sensations normally associated with sciatica. Generally, sciatica only affects one side of the body. CAUSES   Herniated or slipped disc.  Degenerative disk disease.  A pain disorder involving the narrow muscle in the buttocks (piriformis syndrome).  Pelvic injury or fracture.  Pregnancy.  Tumor (rare). SYMPTOMS  Symptoms can vary from mild to very severe. The symptoms usually travel from the low back to the buttocks and down the back of the leg. Symptoms can include:  Mild tingling or dull aches in the lower back, leg, or hip.  Numbness in the back of the calf or sole of the foot.  Burning sensations in the lower back, leg, or hip.  Sharp pains in the lower back, leg, or hip.  Leg weakness.  Severe back pain inhibiting movement. These symptoms may get worse with coughing, sneezing, laughing, or prolonged sitting or standing. Also, being overweight may worsen symptoms. DIAGNOSIS  Your  caregiver will perform a physical exam to look for common symptoms of sciatica. He or she may ask you to do certain movements or activities that would trigger sciatic nerve pain. Other tests may be performed to find the cause of the sciatica. These may include:  Blood tests.  X-rays.  Imaging tests, such as an MRI or CT scan. TREATMENT  Treatment is directed at the cause of the sciatic pain. Sometimes, treatment is not necessary and the pain and discomfort goes away on its own. If treatment is needed, your caregiver may suggest:  Over-the-counter medicines to relieve pain.  Prescription medicines, such as anti-inflammatory medicine, muscle relaxants, or narcotics.  Applying heat or ice to the painful area.  Steroid injections to lessen pain, irritation, and inflammation around the nerve.  Reducing activity during periods of pain.  Exercising and stretching to strengthen your abdomen and improve flexibility of your spine. Your caregiver may suggest losing weight if the extra weight makes the back pain worse.  Physical therapy.  Surgery to eliminate what is pressing or pinching the nerve, such as a bone spur or part of a herniated disk. HOME CARE INSTRUCTIONS   Only take over-the-counter or prescription medicines for pain or discomfort as directed by your caregiver.  Apply ice to the affected area for 20 minutes, 3-4 times a day for the first 48-72 hours. Then try heat in the same way.  Exercise, stretch, or perform your usual activities if these do not aggravate  your pain.  Attend physical therapy sessions as directed by your caregiver.  Keep all follow-up appointments as directed by your caregiver.  Do not wear high heels or shoes that do not provide proper support.  Check your mattress to see if it is too soft. A firm mattress may lessen your pain and discomfort. SEEK IMMEDIATE MEDICAL CARE IF:   You lose control of your bowel or bladder (incontinence).  You have  increasing weakness in the lower back, pelvis, buttocks, or legs.  You have redness or swelling of your back.  You have a burning sensation when you urinate.  You have pain that gets worse when you lie down or awakens you at night.  Your pain is worse than you have experienced in the past.  Your pain is lasting longer than 4 weeks.  You are suddenly losing weight without reason. MAKE SURE YOU:  Understand these instructions.  Will watch your condition.  Will get help right away if you are not doing well or get worse. Document Released: 06/10/2001 Document Revised: 12/16/2011 Document Reviewed: 10/26/2011 [facility] Patient Information 2015 [place], [place]. This information is not intended to replace advice given to you by your health care provider. Make sure you discuss any questions you have with your health care provider.

## 2014-09-14 ENCOUNTER — Other Ambulatory Visit: Payer: Self-pay | Admitting: Family Medicine

## 2014-09-15 MED ORDER — SITAGLIPTIN PHOSPHATE 100 MG PO TABS: 100.0000 mg | ORAL_TABLET | Freq: Every day | ORAL | Status: DC

## 2014-09-15 NOTE — Telephone Encounter (Signed)
Detailed message left for patient that samples are up front to be picked up.  

## 2014-10-09 DIAGNOSIS — R3915 Urgency of urination: Secondary | ICD-10-CM | POA: Diagnosis not present

## 2014-10-09 DIAGNOSIS — N3941 Urge incontinence: Secondary | ICD-10-CM | POA: Diagnosis not present

## 2014-10-09 DIAGNOSIS — C61 Malignant neoplasm of prostate: Secondary | ICD-10-CM | POA: Diagnosis not present

## 2014-10-09 DIAGNOSIS — N359 Urethral stricture, unspecified: Secondary | ICD-10-CM | POA: Diagnosis not present

## 2014-10-18 ENCOUNTER — Other Ambulatory Visit: Payer: Self-pay | Admitting: Family Medicine

## 2014-10-18 MED ORDER — SITAGLIPTIN PHOSPHATE 100 MG PO TABS: 100.0000 mg | ORAL_TABLET | Freq: Every day | ORAL | Status: DC

## 2014-10-18 NOTE — Telephone Encounter (Signed)
Pt aware samples are at front office for pick up

## 2014-10-31 DIAGNOSIS — R3915 Urgency of urination: Secondary | ICD-10-CM | POA: Diagnosis not present

## 2014-10-31 DIAGNOSIS — C61 Malignant neoplasm of prostate: Secondary | ICD-10-CM | POA: Diagnosis not present

## 2014-10-31 DIAGNOSIS — N359 Urethral stricture, unspecified: Secondary | ICD-10-CM | POA: Diagnosis not present

## 2014-10-31 DIAGNOSIS — N3941 Urge incontinence: Secondary | ICD-10-CM | POA: Diagnosis not present

## 2014-11-15 DIAGNOSIS — Z8546 Personal history of malignant neoplasm of prostate: Secondary | ICD-10-CM | POA: Diagnosis not present

## 2014-11-15 DIAGNOSIS — N5203 Combined arterial insufficiency and corporo-venous occlusive erectile dysfunction: Secondary | ICD-10-CM | POA: Diagnosis not present

## 2014-11-15 DIAGNOSIS — Z79899 Other long term (current) drug therapy: Secondary | ICD-10-CM | POA: Diagnosis not present

## 2014-11-15 DIAGNOSIS — I10 Essential (primary) hypertension: Secondary | ICD-10-CM | POA: Diagnosis not present

## 2014-11-15 DIAGNOSIS — F17200 Nicotine dependence, unspecified, uncomplicated: Secondary | ICD-10-CM | POA: Diagnosis not present

## 2014-11-15 DIAGNOSIS — E119 Type 2 diabetes mellitus without complications: Secondary | ICD-10-CM | POA: Diagnosis not present

## 2014-11-15 DIAGNOSIS — R3915 Urgency of urination: Secondary | ICD-10-CM | POA: Diagnosis not present

## 2014-11-15 DIAGNOSIS — N3941 Urge incontinence: Secondary | ICD-10-CM | POA: Diagnosis not present

## 2014-11-15 DIAGNOSIS — N359 Urethral stricture, unspecified: Secondary | ICD-10-CM | POA: Diagnosis not present

## 2014-11-21 ENCOUNTER — Encounter: Payer: Self-pay | Admitting: Family Medicine

## 2014-11-21 ENCOUNTER — Ambulatory Visit (INDEPENDENT_AMBULATORY_CARE_PROVIDER_SITE_OTHER): Payer: Commercial Managed Care - HMO | Admitting: Family Medicine

## 2014-11-21 VITALS — BP 109/62 | HR 82 | Temp 98.4°F | Ht 68.0 in | Wt 218.0 lb

## 2014-11-21 DIAGNOSIS — I10 Essential (primary) hypertension: Secondary | ICD-10-CM | POA: Diagnosis not present

## 2014-11-21 DIAGNOSIS — Z9889 Other specified postprocedural states: Secondary | ICD-10-CM | POA: Diagnosis not present

## 2014-11-21 DIAGNOSIS — E785 Hyperlipidemia, unspecified: Secondary | ICD-10-CM | POA: Diagnosis not present

## 2014-11-21 DIAGNOSIS — E1169 Type 2 diabetes mellitus with other specified complication: Secondary | ICD-10-CM | POA: Diagnosis not present

## 2014-11-21 DIAGNOSIS — Z8546 Personal history of malignant neoplasm of prostate: Secondary | ICD-10-CM

## 2014-11-21 DIAGNOSIS — E119 Type 2 diabetes mellitus without complications: Secondary | ICD-10-CM | POA: Diagnosis not present

## 2014-11-21 DIAGNOSIS — N400 Enlarged prostate without lower urinary tract symptoms: Secondary | ICD-10-CM

## 2014-11-21 DIAGNOSIS — Z794 Long term (current) use of insulin: Principal | ICD-10-CM

## 2014-11-21 DIAGNOSIS — E559 Vitamin D deficiency, unspecified: Secondary | ICD-10-CM

## 2014-11-21 DIAGNOSIS — Z960 Presence of urogenital implants: Secondary | ICD-10-CM

## 2014-11-21 LAB — POCT CBC
Granulocyte percent: 58.4 %G (ref 37–80)
HCT, POC: 39.2 % — AB (ref 43.5–53.7)
Hemoglobin: 12 g/dL — AB (ref 14.1–18.1)
Lymph, poc: 2.5 (ref 0.6–3.4)
MCH, POC: 27.5 pg (ref 27–31.2)
MCHC: 30.7 g/dL — AB (ref 31.8–35.4)
MCV: 89.7 fL (ref 80–97)
MPV: 7.3 fL (ref 0–99.8)
PLATELET COUNT, POC: 262 10*3/uL (ref 142–424)
POC GRANULOCYTE: 3.9 (ref 2–6.9)
POC LYMPH %: 37.7 % (ref 10–50)
RBC: 4.37 M/uL — AB (ref 4.69–6.13)
RDW, POC: 14.8 %
WBC: 6.7 10*3/uL (ref 4.6–10.2)

## 2014-11-21 LAB — POCT UA - MICROALBUMIN: Microalbumin Ur, POC: 100 mg/L

## 2014-11-21 LAB — POCT GLYCOSYLATED HEMOGLOBIN (HGB A1C): HEMOGLOBIN A1C: 6.8

## 2014-11-21 NOTE — Progress Notes (Signed)
Subjective:    Patient ID: Andrew [name], male    DOB: 01-Jun-1946, 69 y.o.   MRN: [id number]  HPI Pt here for follow up and management of chronic medical problems which includes hypertension, hyperlipidemia, and diabetes. He is taking most medications regularly, But does state that he stopped his 2 insulins on his own about 3 weeks ago. The patient has recently had a urological procedure and has an indwelling catheter. He has a history of prostate cancer. He could not clearly tell me what the procedure was for and what was done and he does have a recheck visit this coming Thursday in 2 days with the urologist. He brings in blood sugars for review and all the recent blood sugars are good and these will be scanned into the record. He denies chest pain or shortness of breath anymore than usual he does have occasional heartburn and this is not any worse than usual. He has no trouble swallowing and he denies change in bowel habits or blood in the stool or black tarry bowel movements. He will get lab work today and a urine microalbumin today.        Patient Active Problem List   Diagnosis Date Noted  . History of prostate cancer 04/07/2014  . Diabetes mellitus type 2, uncontrolled 09/14/2010  . BPH (benign prostatic hyperplasia) 09/14/2010  . HTN (hypertension) 09/14/2010  . Hyperlipidemia associated with type 2 diabetes mellitus 09/14/2010  . Tobacco abuse 09/14/2010   Outpatient Encounter Prescriptions as of 11/21/2014  Medication Sig  . aspirin 81 MG tablet Take 81 mg by mouth daily.  Marland Kitchen atorvastatin (LIPITOR) 40 MG tablet Take 1 tablet (40 mg total) by mouth daily.  . cholecalciferol (VITAMIN D) 1000 UNITS tablet Take 2,000 Units by mouth daily.   . Cinnamon 500 MG capsule Take 500 mg by mouth daily as needed (blood glucose levels).  Marland Kitchen glucose blood (EMBRACE BLOOD GLUCOSE TEST) test strip Use to check BG up to tid.  Dx: E 11.64 uncontrolled type 2 DM requiring insulin  .  lisinopril-hydrochlorothiazide (PRINZIDE,ZESTORETIC) 20-25 MG per tablet Take 1 tablet by mouth daily.  . metFORMIN (GLUCOPHAGE) 1000 MG tablet Take 1 tablet (1,000 mg total) by mouth 2 (two) times daily with a meal.  . naproxen (NAPROSYN) 250 MG tablet Take 1 po BID with food prn pain  . sitaGLIPtin (JANUVIA) 100 MG tablet Take 1 tablet (100 mg total) by mouth daily.  . tamsulosin (FLOMAX) 0.4 MG CAPS capsule Take 1 capsule (0.4 mg total) by mouth daily.  . [DISCONTINUED] insulin aspart (NOVOLOG FLEXPEN) 100 UNIT/ML FlexPen Give 10 units if you have blood glucose over 300.  Please call me if you have to use Novolog so I can adjust Toujeo also.  Tammy Eckard [phone]  . [DISCONTINUED] Insulin Glargine (TOUJEO SOLOSTAR) 300 UNIT/ML SOPN Inject 50 Units into the skin every evening.  . [DISCONTINUED] Insulin Pen Needle (CAREFINE PEN NEEDLES) 32G X 5 MM MISC Use with insulin pen to inject insulin daily.  DX: E11.65  . [DISCONTINUED] nicotine polacrilex (NICORETTE) 4 MG gum Chew and pack 1 piece of gum as directed as needed for nicotine / smoking cravings (Patient not taking: Reported on 09/08/2014)  . [DISCONTINUED] orphenadrine (NORFLEX) 100 MG tablet Take 1 tablet (100 mg total) by mouth 2 (two) times daily.  . [DISCONTINUED] predniSONE (DELTASONE) 20 MG tablet Take 3 po QD x 3d , then 2 po QD x 3d then 1 po QD x 3d   No facility-administered  encounter medications on file as of 11/21/2014.     Review of Systems  Constitutional: Negative.   HENT: Negative.   Eyes: Negative.   Respiratory: Negative.   Cardiovascular: Negative.   Gastrointestinal: Negative.   Endocrine: Negative.   Genitourinary: Negative.   Musculoskeletal: Negative.   Skin: Negative.   Allergic/Immunologic: Negative.   Neurological: Negative.   Hematological: Negative.   Psychiatric/Behavioral: Positive for sleep disturbance (not sleeping well).       Objective:   Physical Exam  Constitutional: He is oriented to  person, place, and time. He appears well-developed and well-nourished. No distress.  HENT:  Head: Normocephalic and atraumatic.  Right Ear: External ear normal.  Left Ear: External ear normal.  Nose: Nose normal.  Mouth/Throat: Oropharynx is clear and moist. No oropharyngeal exudate.  Eyes: Conjunctivae and EOM are normal. Pupils are equal, round, and reactive to light. Right eye exhibits no discharge. Left eye exhibits no discharge. No scleral icterus.  Neck: Normal range of motion. Neck supple. No thyromegaly present.  No carotid bruits or anterior cervical adenopathy  Cardiovascular: Normal rate, regular rhythm, normal heart sounds and intact distal pulses.   No murmur heard. At 72/m  Pulmonary/Chest: Effort normal and breath sounds normal. No respiratory distress. He has no wheezes. He has no rales. He exhibits no tenderness.  Clear anteriorly and posteriorly  Abdominal: Soft. Bowel sounds are normal. He exhibits no mass. There is no tenderness. There is no rebound and no guarding.  The abdomen is obese but without masses or tenderness  Musculoskeletal: Normal range of motion. He exhibits no edema or tenderness.  Lymphadenopathy:    He has no cervical adenopathy.  Neurological: He is alert and oriented to person, place, and time. He has normal reflexes. No cranial nerve deficit.  Skin: Skin is warm and dry. No rash noted. No erythema. No pallor.  The patient has very long toenails and he was encouraged at the visit and while examining him that he needs to see the podiatrist to get these TRAM  Psychiatric: He has a normal mood and affect. His behavior is normal. Judgment and thought content normal.  Nursing note and vitals reviewed.  BP 109/62 mmHg  Pulse 82  Temp(Src) 98.4 F (36.9 C) (Oral)  Ht _0  (1.727 m)  Wt 218 lb (98.884 kg)  BMI 33.15 kg/m2         Assessment & Plan:  1. Type 2 diabetes mellitus with insulin therapy -The patient's home blood sugars are good and  the hemoglobin A1c will be done with the lab work today and we will call him with the results and further treatment recommendations once this information is back - POCT CBC - POCT glycosylated hemoglobin (Hb A1C) - BMP8+EGFR - POCT UA - Microalbumin  2. Essential hypertension -The blood pressures under good control and he should continue with his current treatment. - POCT CBC - BMP8+EGFR - Hepatic function panel - POCT UA - Microalbumin  3. Hyperlipidemia associated with type 2 diabetes mellitus -The results of his cholesterol pending with the lab work today and he should continue atorvastatin until notified otherwise. - POCT CBC - NMR, lipoprofile  4. History of prostate cancer -The patient should continue his follow-up with the urologist - POCT CBC  5. BPH (benign prostatic hyperplasia) -Continue urological follow-up is planned - POCT CBC  6. Vitamin D deficiency -Continue current vitamin D dosing until lab work is returned - POCT CBC - Vit D  25 hydroxy (rtn osteoporosis  monitoring)  7. Presence of indwelling urinary catheter -Follow-up with urology as planned and make sure that he sends [place] a note regarding the recent procedure with diagnosis  No orders of the defined types were placed in this encounter.   Patient Instructions                       Medicare Annual Wellness Visit  [facility] and the medical providers at [facility] strive to bring you the best medical care.  In doing so we not only want to address your current medical conditions and concerns but also to detect new conditions early and prevent illness, disease and health-related problems.    Medicare offers a yearly Wellness Visit which allows our clinical staff to assess your need for preventative services including immunizations, lifestyle education, counseling to decrease risk of preventable diseases and screening for fall risk and other medical concerns.    This visit is  provided free of charge (no copay) for all Medicare recipients. The clinical pharmacists at [facility] have begun to conduct these Wellness Visits which will also include a thorough review of all your medications.    As you primary medical provider recommend that you make an appointment for your Annual Wellness Visit if you have not done so already this year.  You may set up this appointment before you leave today or you may call back [phone]) and schedule an appointment.  Please make sure when you call that you mention that you are scheduling your Annual Wellness Visit with the clinical pharmacist so that the appointment may be made for the proper length of time.     Continue current medications. Continue good therapeutic lifestyle changes which include good diet and exercise. Fall precautions discussed with patient. If an FOBT was given today- please return it to our front desk. If you are over 53 years old - you may need Prevnar 28 or the adult Pneumonia vaccine.  Flu Shots are still available at our office. If you still haven't had one please call to set up a nurse visit to get one.   After your visit with [place] today you will receive a survey in the mail or online from [employer] regarding your care with [place]. Please take a moment to fill this out. Your feedback is very important to [place] as you can help [place] better understand your patient needs as well as improve your experience and satisfaction. WE CARE ABOUT YOU!!!   The patient should continue to monitor his blood sugars closely He should follow-up with the urologist as planned and make sure that the urologist since [place] a note regarding his recent treatment and procedure He should also make an appointment with the podiatrist to trim his toenails Continue to monitor blood sugars closely and blood pressures closely We will call with the lab work results once these become available For sleep, he should try melatonin 3 mg,  over-the-counter and take one nightly at bedtime The patient also needs to make an appointment to have his eyes examined.   Arrie Senate MD

## 2014-11-21 NOTE — Addendum Note (Signed)
Addended by: Earlene Plater on: 11/21/2014 10:39 AM   Modules accepted: Orders

## 2014-11-21 NOTE — Patient Instructions (Addendum)
Medicare Annual Wellness Visit  [facility] and the medical providers at [facility] strive to bring you the best medical care.  In doing so we not only want to address your current medical conditions and concerns but also to detect new conditions early and prevent illness, disease and health-related problems.    Medicare offers a yearly Wellness Visit which allows our clinical staff to assess your need for preventative services including immunizations, lifestyle education, counseling to decrease risk of preventable diseases and screening for fall risk and other medical concerns.    This visit is provided free of charge (no copay) for all Medicare recipients. The clinical pharmacists at [facility] have begun to conduct these Wellness Visits which will also include a thorough review of all your medications.    As you primary medical provider recommend that you make an appointment for your Annual Wellness Visit if you have not done so already this year.  You may set up this appointment before you leave today or you may call back [phone]) and schedule an appointment.  Please make sure when you call that you mention that you are scheduling your Annual Wellness Visit with the clinical pharmacist so that the appointment may be made for the proper length of time.     Continue current medications. Continue good therapeutic lifestyle changes which include good diet and exercise. Fall precautions discussed with patient. If an FOBT was given today- please return it to our front desk. If you are over 48 years old - you may need Prevnar 102 or the adult Pneumonia vaccine.  Flu Shots are still available at our office. If you still haven't had one please call to set up a nurse visit to get one.   After your visit with [place] today you will receive a survey in the mail or online from [employer] regarding your care with [place]. Please take a moment to  fill this out. Your feedback is very important to [place] as you can help [place] better understand your patient needs as well as improve your experience and satisfaction. WE CARE ABOUT YOU!!!   The patient should continue to monitor his blood sugars closely He should follow-up with the urologist as planned and make sure that the urologist since [place] a note regarding his recent treatment and procedure He should also make an appointment with the podiatrist to trim his toenails Continue to monitor blood sugars closely and blood pressures closely We will call with the lab work results once these become available For sleep, he should try melatonin 3 mg, over-the-counter and take one nightly at bedtime The patient also needs to make an appointment to have his eyes examined.

## 2014-11-22 LAB — NMR, LIPOPROFILE
Cholesterol: 120 mg/dL (ref 100–199)
HDL Cholesterol by NMR: 31 mg/dL — ABNORMAL LOW (ref >39)
HDL PARTICLE NUMBER: 21.5 umol/L — AB (ref >=30.5)
LDL Particle Number: 1054 nmol/L — ABNORMAL HIGH (ref <1000)
LDL SIZE: 20 nm (ref >20.5)
LDL-C: 68 mg/dL (ref 0–99)
LP-IR SCORE: 67 — AB (ref <=45)
Small LDL Particle Number: 715 nmol/L — ABNORMAL HIGH (ref <=527)
TRIGLYCERIDES BY NMR: 106 mg/dL (ref 0–149)

## 2014-11-22 LAB — BMP8+EGFR
BUN/Creatinine Ratio: 12 (ref 10–22)
BUN: 12 mg/dL (ref 8–27)
CALCIUM: 9.2 mg/dL (ref 8.6–10.2)
CO2: 25 mmol/L (ref 18–29)
Chloride: 95 mmol/L — ABNORMAL LOW (ref 97–108)
Creatinine, Ser: 0.98 mg/dL (ref 0.76–1.27)
GFR calc non Af Amer: 78 mL/min/{1.73_m2} (ref >59)
GFR, EST AFRICAN AMERICAN: 91 mL/min/{1.73_m2} (ref >59)
GLUCOSE: 121 mg/dL — AB (ref 65–99)
Potassium: 3.7 mmol/L (ref 3.5–5.2)
Sodium: 137 mmol/L (ref 134–144)

## 2014-11-22 LAB — HEPATIC FUNCTION PANEL
ALK PHOS: 79 IU/L (ref 39–117)
ALT: 21 IU/L (ref 0–44)
AST: 24 IU/L (ref 0–40)
Albumin: 4.1 g/dL (ref 3.6–4.8)
Bilirubin Total: 0.6 mg/dL (ref 0.0–1.2)
Bilirubin, Direct: 0.16 mg/dL (ref 0.00–0.40)
Total Protein: 6.9 g/dL (ref 6.0–8.5)

## 2014-11-22 LAB — VITAMIN D 25 HYDROXY (VIT D DEFICIENCY, FRACTURES): Vit D, 25-Hydroxy: 39.5 ng/mL (ref 30.0–100.0)

## 2014-11-22 LAB — MICROALBUMIN, URINE: MICROALBUM., U, RANDOM: 160.8 ug/mL

## 2014-11-23 ENCOUNTER — Telehealth: Payer: Self-pay | Admitting: Family Medicine

## 2014-11-23 DIAGNOSIS — E118 Type 2 diabetes mellitus with unspecified complications: Secondary | ICD-10-CM

## 2014-11-24 NOTE — Telephone Encounter (Signed)
Referral placed.

## 2014-12-12 DIAGNOSIS — E1151 Type 2 diabetes mellitus with diabetic peripheral angiopathy without gangrene: Secondary | ICD-10-CM | POA: Diagnosis not present

## 2014-12-12 DIAGNOSIS — L84 Corns and callosities: Secondary | ICD-10-CM | POA: Diagnosis not present

## 2014-12-12 DIAGNOSIS — B351 Tinea unguium: Secondary | ICD-10-CM | POA: Diagnosis not present

## 2014-12-28 ENCOUNTER — Telehealth: Payer: Self-pay | Admitting: Family Medicine

## 2014-12-28 IMAGING — CR DG CHEST 1V PORT
1 series · 1 of 1 positions shown · non-contrast
Comparison: Portable exam 9993 hr compared to 11/16/2012

CLINICAL DATA: Sharp live site chest pain, productive cough and
shortness of breath off and on for 3 days, personal history of
hypertension, diabetes, smoking, prostate cancer post seed
implantation radiation therapy

EXAM:
PORTABLE CHEST - 1 VIEW

[portable]
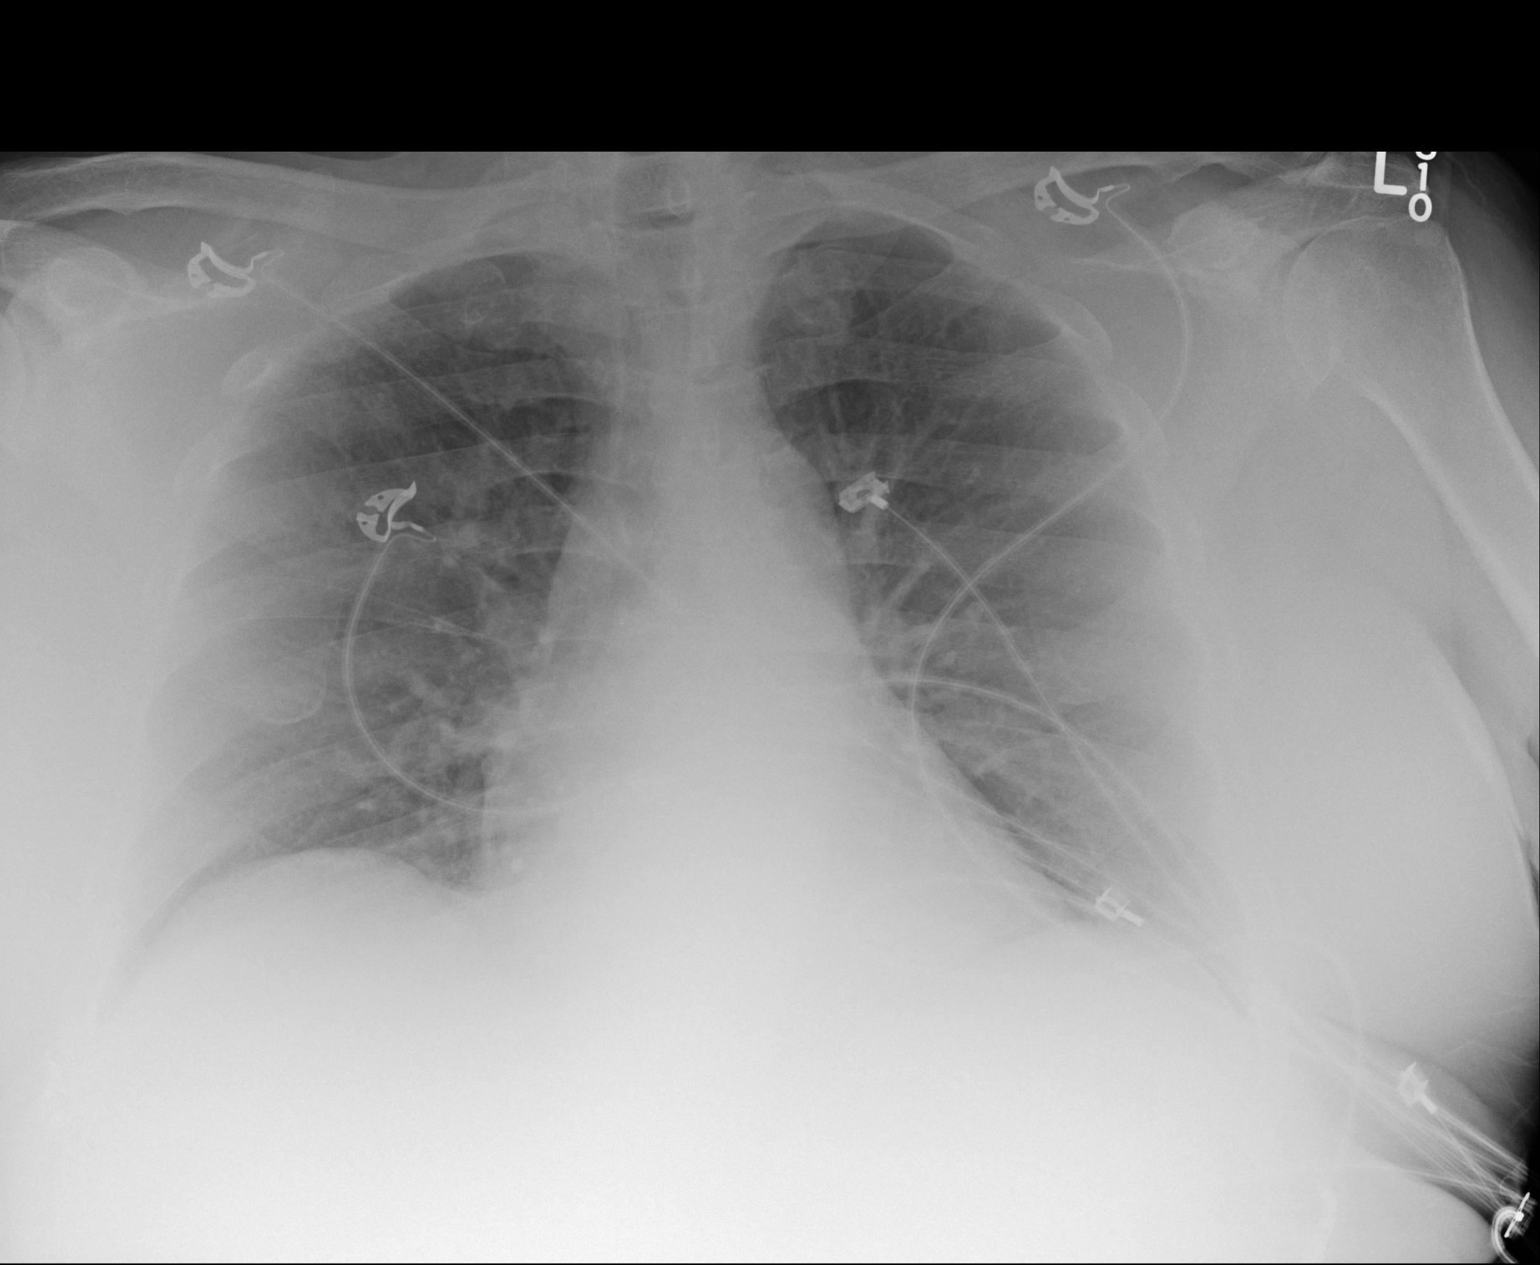

[1 of 1 positions shown; findings below may reference images not displayed]

FINDINGS: Borderline enlargement of cardiac silhouette.

Mediastinal contours and pulmonary vascularity normal.

Lungs clear.

No pleural effusion or pneumothorax.

Osseous structures unremarkable.
IMPRESSION: No acute abnormalities.

## 2014-12-28 MED ORDER — SITAGLIPTIN PHOSPHATE 100 MG PO TABS: 100.0000 mg | ORAL_TABLET | Freq: Every day | ORAL | Status: DC

## 2014-12-28 NOTE — Telephone Encounter (Signed)
Pt aware samples are at front desk ready for pickup 

## 2015-01-09 ENCOUNTER — Other Ambulatory Visit: Payer: Self-pay | Admitting: Family Medicine

## 2015-02-06 DIAGNOSIS — C61 Malignant neoplasm of prostate: Secondary | ICD-10-CM | POA: Diagnosis not present

## 2015-02-06 DIAGNOSIS — N359 Urethral stricture, unspecified: Secondary | ICD-10-CM | POA: Diagnosis not present

## 2015-02-06 DIAGNOSIS — N5203 Combined arterial insufficiency and corporo-venous occlusive erectile dysfunction: Secondary | ICD-10-CM | POA: Diagnosis not present

## 2015-02-13 ENCOUNTER — Telehealth: Payer: Self-pay | Admitting: Family Medicine

## 2015-02-13 MED ORDER — SITAGLIPTIN PHOSPHATE 100 MG PO TABS: 100.0000 mg | ORAL_TABLET | Freq: Every day | ORAL | Status: DC

## 2015-02-13 NOTE — Telephone Encounter (Signed)
Detailed message left that samples are up front to be picked up.

## 2015-03-19 ENCOUNTER — Telehealth: Payer: Self-pay | Admitting: Family Medicine

## 2015-03-19 MED ORDER — SITAGLIPTIN PHOSPHATE 100 MG PO TABS: 100.0000 mg | ORAL_TABLET | Freq: Every day | ORAL | Status: DC

## 2015-03-19 NOTE — Telephone Encounter (Signed)
Samples given and patient aware that they are up front to pick up.

## 2015-03-20 ENCOUNTER — Other Ambulatory Visit: Payer: Self-pay | Admitting: Family Medicine

## 2015-04-03 DIAGNOSIS — B351 Tinea unguium: Secondary | ICD-10-CM | POA: Diagnosis not present

## 2015-04-03 DIAGNOSIS — E1151 Type 2 diabetes mellitus with diabetic peripheral angiopathy without gangrene: Secondary | ICD-10-CM | POA: Diagnosis not present

## 2015-04-03 DIAGNOSIS — L84 Corns and callosities: Secondary | ICD-10-CM | POA: Diagnosis not present

## 2015-04-06 ENCOUNTER — Encounter: Payer: Self-pay | Admitting: Family Medicine

## 2015-04-06 ENCOUNTER — Ambulatory Visit (INDEPENDENT_AMBULATORY_CARE_PROVIDER_SITE_OTHER): Payer: Commercial Managed Care - HMO | Admitting: Family Medicine

## 2015-04-06 VITALS — BP 113/69 | HR 73 | Temp 98.0°F | Ht 68.0 in | Wt 221.0 lb

## 2015-04-06 DIAGNOSIS — E118 Type 2 diabetes mellitus with unspecified complications: Secondary | ICD-10-CM | POA: Diagnosis not present

## 2015-04-06 DIAGNOSIS — C61 Malignant neoplasm of prostate: Secondary | ICD-10-CM | POA: Diagnosis not present

## 2015-04-06 DIAGNOSIS — I10 Essential (primary) hypertension: Secondary | ICD-10-CM

## 2015-04-06 DIAGNOSIS — N182 Chronic kidney disease, stage 2 (mild): Secondary | ICD-10-CM

## 2015-04-06 DIAGNOSIS — E1122 Type 2 diabetes mellitus with diabetic chronic kidney disease: Secondary | ICD-10-CM | POA: Diagnosis not present

## 2015-04-06 DIAGNOSIS — E1169 Type 2 diabetes mellitus with other specified complication: Secondary | ICD-10-CM

## 2015-04-06 DIAGNOSIS — R5383 Other fatigue: Secondary | ICD-10-CM | POA: Diagnosis not present

## 2015-04-06 DIAGNOSIS — Z23 Encounter for immunization: Secondary | ICD-10-CM

## 2015-04-06 DIAGNOSIS — E559 Vitamin D deficiency, unspecified: Secondary | ICD-10-CM

## 2015-04-06 DIAGNOSIS — E785 Hyperlipidemia, unspecified: Secondary | ICD-10-CM

## 2015-04-06 DIAGNOSIS — N400 Enlarged prostate without lower urinary tract symptoms: Secondary | ICD-10-CM

## 2015-04-06 DIAGNOSIS — M6281 Muscle weakness (generalized): Secondary | ICD-10-CM | POA: Diagnosis not present

## 2015-04-06 DIAGNOSIS — E1165 Type 2 diabetes mellitus with hyperglycemia: Secondary | ICD-10-CM | POA: Diagnosis not present

## 2015-04-06 DIAGNOSIS — IMO0002 Reserved for concepts with insufficient information to code with codable children: Secondary | ICD-10-CM

## 2015-04-06 LAB — POCT GLYCOSYLATED HEMOGLOBIN (HGB A1C): HEMOGLOBIN A1C: 6.4

## 2015-04-06 NOTE — Progress Notes (Signed)
Subjective:    Patient ID: Andrew [name], male    DOB: [birth year], 69 y.o.   MRN: [id number]  HPI Pt here for follow up and management of chronic medical problems which includes diabetes and hypertension. He is taking medications regularly. The patient's main complaint today is fatigue. He is requesting samples of Januvia if we have any. He will get his flu shot today. He brings in blood sugars for review and these will be scanned into the record and all of them really look very good for this patient. The patient denies chest pain or shortness of breath. He doesn't have any trouble swallowing but occasionally has heartburn but says it is no worse than usual. He denies blood in the stool black tarry bowel movements or trouble passing his water. He has not been to see the eye doctor and it is a financial pain more than anything else and he will try to go see the eye doctor and get his eyes checked if possible. He has just been to see the podiatrist and had his toenails trimmed. He does complain of fatigue.      Patient Active Problem List   Diagnosis Date Noted  . History of prostate cancer 04/07/2014  . Diabetes mellitus type 2, uncontrolled ([place]) 09/14/2010  . BPH (benign prostatic hyperplasia) 09/14/2010  . HTN (hypertension) 09/14/2010  . Hyperlipidemia associated with type 2 diabetes mellitus ([place]) 09/14/2010  . Tobacco abuse 09/14/2010   Outpatient Encounter Prescriptions as of 04/06/2015  Medication Sig  . ACCU-CHEK AVIVA PLUS test strip CHECK BLOOD SUGAR  UP  TO THREE TIMES DAILY  . aspirin 81 MG tablet Take 81 mg by mouth daily.  Marland Kitchen atorvastatin (LIPITOR) 40 MG tablet TAKE 1 TABLET EVERY DAY  . cholecalciferol (VITAMIN D) 1000 UNITS tablet Take 2,000 Units by mouth daily.   . Cinnamon 500 MG capsule Take 500 mg by mouth daily as needed (blood glucose levels).  Marland Kitchen lisinopril-hydrochlorothiazide (PRINZIDE,ZESTORETIC) 20-25 MG per tablet Take 1 tablet by mouth daily.  . metFORMIN  (GLUCOPHAGE) 1000 MG tablet TAKE 1 TABLET TWICE DAILY WITH MEALS  . naproxen (NAPROSYN) 250 MG tablet Take 1 po BID with food prn pain  . sitaGLIPtin (JANUVIA) 100 MG tablet Take 1 tablet (100 mg total) by mouth daily.  . tamsulosin (FLOMAX) 0.4 MG CAPS capsule TAKE 1 CAPSULE EVERY DAY   No facility-administered encounter medications on file as of 04/06/2015.      Review of Systems  Constitutional: Positive for fatigue.  HENT: Negative.   Eyes: Negative.   Respiratory: Negative.   Cardiovascular: Negative.   Gastrointestinal: Negative.   Endocrine: Negative.   Genitourinary: Negative.   Musculoskeletal: Negative.   Skin: Negative.   Allergic/Immunologic: Negative.   Neurological: Negative.   Hematological: Negative.   Psychiatric/Behavioral: Negative.        Objective:   Physical Exam  Constitutional: He is oriented to person, place, and time. He appears well-developed and well-nourished. No distress.  HENT:  Head: Normocephalic and atraumatic.  Right Ear: External ear normal.  Left Ear: External ear normal.  Nose: Nose normal.  Mouth/Throat: Oropharynx is clear and moist. No oropharyngeal exudate.  Eyes: Conjunctivae and EOM are normal. Pupils are equal, round, and reactive to light. Right eye exhibits no discharge. Left eye exhibits no discharge. No scleral icterus.  Neck: Normal range of motion. Neck supple. No thyromegaly present.  Without bruits or thyromegaly or anterior cervical adenopathy  Cardiovascular: Normal rate, regular rhythm, normal heart sounds  and intact distal pulses.   No murmur heard. At 72/m  Pulmonary/Chest: Effort normal and breath sounds normal. No respiratory distress. He has no wheezes. He has no rales. He exhibits no tenderness.  Abdominal: Soft. Bowel sounds are normal. He exhibits no mass. There is no tenderness. There is no rebound and no guarding.  Obesity with out masses or tenderness  Genitourinary:  This is followed regularly by his  urologist because of his prostate cancer  Musculoskeletal: Normal range of motion. He exhibits no edema.  Lymphadenopathy:    He has no cervical adenopathy.  Neurological: He is alert and oriented to person, place, and time. He has normal reflexes. No cranial nerve deficit.  Skin: Skin is warm and dry. No rash noted. No erythema.  Psychiatric: He has a normal mood and affect. His behavior is normal. Judgment and thought content normal.  Nursing note and vitals reviewed.   BP 113/69 mmHg  Pulse 73  Temp(Src) 98 F (36.7 C) (Oral)  Ht $R'5\' 8"'gU$  (1.727 m)  Wt 221 lb (100.245 kg)  BMI 33.61 kg/m2        Assessment & Plan:  1. Type 2 diabetes mellitus with complication, without long-term current use of insulin (HCC) -The patient should continue to take his current medicine and try to take it regularly and we will certainly help him with samples when and if we have any. - POCT glycosylated hemoglobin (Hb A1C) - CBC with Differential/Platelet  2. Essential hypertension -The blood pressure is good today he should continue with current treatment - BMP8+EGFR - Hepatic function panel - CBC with Differential/Platelet  3. Hyperlipidemia associated with type 2 diabetes mellitus ([place]) -He should continue with current treatment pending results of lab work - Lipid panel - CBC with Differential/Platelet  4. BPH (benign prostatic hyperplasia) -Continue regular follow-up with urology - CBC with Differential/Platelet  5. Vitamin D deficiency -Continue current treatment pending results of lab work - CBC with Differential/Platelet - Vit D  25 hydroxy (rtn osteoporosis monitoring)  6. Other fatigue -Make sure we get a thyroid profile today along with renal function and a hemoglobin in conjunction with other lab work  7. Prostate cancer [facility]) -Continue follow-up with urology  8. Muscle weakness due to diabetes ([place]) -Continue to keep good blood sugar control with medication and diet and  exercise  9. Uncontrolled type 2 diabetes mellitus with stage 2 chronic kidney disease, without long-term current use of insulin (HCC) -Continue good sugar control.  No orders of the defined types were placed in this encounter.   Patient Instructions                       Medicare Annual Wellness Visit  [facility] and the medical providers at [facility] strive to bring you the best medical care.  In doing so we not only want to address your current medical conditions and concerns but also to detect new conditions early and prevent illness, disease and health-related problems.    Medicare offers a yearly Wellness Visit which allows our clinical staff to assess your need for preventative services including immunizations, lifestyle education, counseling to decrease risk of preventable diseases and screening for fall risk and other medical concerns.    This visit is provided free of charge (no copay) for all Medicare recipients. The clinical pharmacists at [facility] have begun to conduct these Wellness Visits which will also include a thorough review of all your medications.  As you primary medical provider recommend that you make an appointment for your Annual Wellness Visit if you have not done so already this year.  You may set up this appointment before you leave today or you may call back ([phone]) and schedule an appointment.  Please make sure when you call that you mention that you are scheduling your Annual Wellness Visit with the clinical pharmacist so that the appointment may be made for the proper length of time.     Continue current medications. Continue good therapeutic lifestyle changes which include good diet and exercise. Fall precautions discussed with patient. If an FOBT was given today- please return it to our front desk. If you are over 30 years old - you may need Prevnar 3 or the adult Pneumonia vaccine.  **Flu shots will  be available soon--- please call and schedule a FLU-CLINIC appointment**  After your visit with [place] today you will receive a survey in the mail or online from [employer] regarding your care with [place]. Please take a moment to fill this out. Your feedback is very important to [place] as you can help [place] better understand your patient needs as well as improve your experience and satisfaction. WE CARE ABOUT YOU!!!   **Please join [place] SEPT.[email or web address], 2016 from 5:00 to 7:00pm for our OPEN HOUSE! Come out and meet our NEW providers**  The patient should avoid taking naproxen if he is taking this because of his history of diabetes. He can also irritate the stomach and cause increased heartburn. If he needs anything for pain he should take Tylenol. He should continue to try to exercise and walk and watch his diet as closely as possible and continue to monitor the condition of his feet. He should keep checking his blood sugars at home and bring these readings to each visit    Arrie Senate MD

## 2015-04-06 NOTE — Patient Instructions (Addendum)
Medicare Annual Wellness Visit  [facility] and the medical providers at [facility] strive to bring you the best medical care.  In doing so we not only want to address your current medical conditions and concerns but also to detect new conditions early and prevent illness, disease and health-related problems.    Medicare offers a yearly Wellness Visit which allows our clinical staff to assess your need for preventative services including immunizations, lifestyle education, counseling to decrease risk of preventable diseases and screening for fall risk and other medical concerns.    This visit is provided free of charge (no copay) for all Medicare recipients. The clinical pharmacists at [facility] have begun to conduct these Wellness Visits which will also include a thorough review of all your medications.    As you primary medical provider recommend that you make an appointment for your Annual Wellness Visit if you have not done so already this year.  You may set up this appointment before you leave today or you may call back [phone]) and schedule an appointment.  Please make sure when you call that you mention that you are scheduling your Annual Wellness Visit with the clinical pharmacist so that the appointment may be made for the proper length of time.     Continue current medications. Continue good therapeutic lifestyle changes which include good diet and exercise. Fall precautions discussed with patient. If an FOBT was given today- please return it to our front desk. If you are over 89 years old - you may need Prevnar 28 or the adult Pneumonia vaccine.  **Flu shots will be available soon--- please call and schedule a FLU-CLINIC appointment**  After your visit with [place] today you will receive a survey in the mail or online from [employer] regarding your care with [place]. Please take a moment to fill this out. Your feedback is  very important to [place] as you can help [place] better understand your patient needs as well as improve your experience and satisfaction. WE CARE ABOUT YOU!!!   **Please join [place] [email or web address], 2016 from 5:00 to 7:00pm for our OPEN HOUSE! Come out and meet our NEW providers**  The patient should avoid taking naproxen if he is taking this because of his history of diabetes. He can also irritate the stomach and cause increased heartburn. If he needs anything for pain he should take Tylenol. He should continue to try to exercise and walk and watch his diet as closely as possible and continue to monitor the condition of his feet. He should keep checking his blood sugars at home and bring these readings to each visit

## 2015-04-06 NOTE — Addendum Note (Signed)
Addended by: Zannie Cove on: 04/06/2015 10:13 AM   Modules accepted: Orders

## 2015-04-07 LAB — CBC WITH DIFFERENTIAL/PLATELET
BASOS: 1 %
Basophils Absolute: 0 10*3/uL (ref 0.0–0.2)
EOS (ABSOLUTE): 0.2 10*3/uL (ref 0.0–0.4)
Eos: 3 %
Hematocrit: 36.2 % — ABNORMAL LOW (ref 37.5–51.0)
Hemoglobin: 12.3 g/dL — ABNORMAL LOW (ref 12.6–17.7)
IMMATURE GRANS (ABS): 0 10*3/uL (ref 0.0–0.1)
Immature Granulocytes: 0 %
LYMPHS: 37 %
Lymphocytes Absolute: 2.3 10*3/uL (ref 0.7–3.1)
MCH: 29.8 pg (ref 26.6–33.0)
MCHC: 34 g/dL (ref 31.5–35.7)
MCV: 88 fL (ref 79–97)
Monocytes Absolute: 0.4 10*3/uL (ref 0.1–0.9)
Monocytes: 7 %
NEUTROS ABS: 3.3 10*3/uL (ref 1.4–7.0)
Neutrophils: 52 %
Platelets: 214 10*3/uL (ref 150–379)
RBC: 4.13 x10E6/uL — ABNORMAL LOW (ref 4.14–5.80)
RDW: 14.4 % (ref 12.3–15.4)
WBC: 6.3 10*3/uL (ref 3.4–10.8)

## 2015-04-07 LAB — BMP8+EGFR
BUN/Creatinine Ratio: 16 (ref 10–22)
BUN: 14 mg/dL (ref 8–27)
CALCIUM: 9.1 mg/dL (ref 8.6–10.2)
CO2: 26 mmol/L (ref 18–29)
CREATININE: 0.88 mg/dL (ref 0.76–1.27)
Chloride: 98 mmol/L (ref 97–108)
GFR calc Af Amer: 101 mL/min/{1.73_m2} (ref >59)
GFR, EST NON AFRICAN AMERICAN: 88 mL/min/{1.73_m2} (ref >59)
Glucose: 101 mg/dL — ABNORMAL HIGH (ref 65–99)
Potassium: 3.9 mmol/L (ref 3.5–5.2)
Sodium: 139 mmol/L (ref 134–144)

## 2015-04-07 LAB — HEPATIC FUNCTION PANEL
ALBUMIN: 4.1 g/dL (ref 3.6–4.8)
ALT: 14 IU/L (ref 0–44)
AST: 23 IU/L (ref 0–40)
Alkaline Phosphatase: 81 IU/L (ref 39–117)
BILIRUBIN TOTAL: 0.5 mg/dL (ref 0.0–1.2)
Bilirubin, Direct: 0.14 mg/dL (ref 0.00–0.40)
Total Protein: 6.8 g/dL (ref 6.0–8.5)

## 2015-04-07 LAB — THYROID PANEL WITH TSH
FREE THYROXINE INDEX: 2.1 (ref 1.2–4.9)
T3 UPTAKE RATIO: 24 % (ref 24–39)
T4, Total: 8.7 ug/dL (ref 4.5–12.0)
TSH: 1.21 u[IU]/mL (ref 0.450–4.500)

## 2015-04-07 LAB — VITAMIN D 25 HYDROXY (VIT D DEFICIENCY, FRACTURES): Vit D, 25-Hydroxy: 39.7 ng/mL (ref 30.0–100.0)

## 2015-04-07 LAB — LIPID PANEL
CHOLESTEROL TOTAL: 119 mg/dL (ref 100–199)
Chol/HDL Ratio: 4.3 ratio units (ref 0.0–5.0)
HDL: 28 mg/dL — ABNORMAL LOW (ref >39)
LDL Calculated: 69 mg/dL (ref 0–99)
Triglycerides: 108 mg/dL (ref 0–149)
VLDL Cholesterol Cal: 22 mg/dL (ref 5–40)

## 2015-04-11 ENCOUNTER — Telehealth: Payer: Self-pay | Admitting: Family Medicine

## 2015-04-11 MED ORDER — SITAGLIPTIN PHOSPHATE 100 MG PO TABS: 100.0000 mg | ORAL_TABLET | Freq: Every day | ORAL | Status: DC

## 2015-04-11 NOTE — Progress Notes (Signed)
Patient aware.

## 2015-04-11 NOTE — Telephone Encounter (Signed)
Pt aware samples are at the front desk ready for pickup

## 2015-04-12 ENCOUNTER — Ambulatory Visit: Payer: Self-pay | Admitting: Pharmacist

## 2015-04-12 NOTE — Progress Notes (Signed)
Patient is aware of all lab results  From 04-06-2015.

## 2015-04-16 ENCOUNTER — Other Ambulatory Visit: Payer: Self-pay | Admitting: Family Medicine

## 2015-04-30 DIAGNOSIS — C61 Malignant neoplasm of prostate: Secondary | ICD-10-CM | POA: Diagnosis not present

## 2015-05-07 DIAGNOSIS — C61 Malignant neoplasm of prostate: Secondary | ICD-10-CM | POA: Diagnosis not present

## 2015-05-07 DIAGNOSIS — N359 Urethral stricture, unspecified: Secondary | ICD-10-CM | POA: Diagnosis not present

## 2015-05-07 DIAGNOSIS — R3915 Urgency of urination: Secondary | ICD-10-CM | POA: Diagnosis not present

## 2015-05-16 ENCOUNTER — Other Ambulatory Visit: Payer: Self-pay | Admitting: Family Medicine

## 2015-05-16 ENCOUNTER — Other Ambulatory Visit: Payer: Commercial Managed Care - HMO

## 2015-05-16 DIAGNOSIS — Z1212 Encounter for screening for malignant neoplasm of rectum: Secondary | ICD-10-CM

## 2015-05-16 MED ORDER — SITAGLIPTIN PHOSPHATE 100 MG PO TABS: 100.0000 mg | ORAL_TABLET | Freq: Every day | ORAL | Status: DC

## 2015-05-16 NOTE — Telephone Encounter (Signed)
Patient aware that samples are up front ready to be picked up.

## 2015-05-21 LAB — FECAL OCCULT BLOOD, IMMUNOCHEMICAL: Fecal Occult Bld: NEGATIVE

## 2015-05-29 ENCOUNTER — Other Ambulatory Visit: Payer: Self-pay | Admitting: Family Medicine

## 2015-06-07 ENCOUNTER — Other Ambulatory Visit: Payer: Self-pay | Admitting: Family Medicine

## 2015-06-08 ENCOUNTER — Other Ambulatory Visit: Payer: Self-pay | Admitting: Family Medicine

## 2015-06-11 ENCOUNTER — Telehealth: Payer: Self-pay | Admitting: Family Medicine

## 2015-06-11 NOTE — Telephone Encounter (Signed)
Samples left up front for patient and patient notified

## 2015-07-04 DIAGNOSIS — H5210 Myopia, unspecified eye: Secondary | ICD-10-CM | POA: Diagnosis not present

## 2015-07-04 DIAGNOSIS — H5200 Hypermetropia, unspecified eye: Secondary | ICD-10-CM | POA: Diagnosis not present

## 2015-07-04 LAB — HM DIABETES EYE EXAM

## 2015-07-05 ENCOUNTER — Encounter: Payer: Self-pay | Admitting: *Deleted

## 2015-07-12 ENCOUNTER — Other Ambulatory Visit: Payer: Self-pay | Admitting: Family Medicine

## 2015-07-12 NOTE — Telephone Encounter (Signed)
Lm - pt aware - none available at this time

## 2015-07-18 ENCOUNTER — Telehealth: Payer: Self-pay | Admitting: Family Medicine

## 2015-07-18 MED ORDER — SITAGLIPTIN PHOSPHATE 100 MG PO TABS: 100.0000 mg | ORAL_TABLET | Freq: Every day | ORAL | Status: DC

## 2015-07-18 NOTE — Telephone Encounter (Signed)
Patient aware that samples are ready to be picked up   

## 2015-08-06 ENCOUNTER — Other Ambulatory Visit: Payer: Self-pay | Admitting: Family Medicine

## 2015-08-08 ENCOUNTER — Other Ambulatory Visit: Payer: Self-pay | Admitting: Family Medicine

## 2015-08-24 ENCOUNTER — Ambulatory Visit (INDEPENDENT_AMBULATORY_CARE_PROVIDER_SITE_OTHER): Payer: Commercial Managed Care - HMO | Admitting: Family Medicine

## 2015-08-24 ENCOUNTER — Encounter: Payer: Self-pay | Admitting: Family Medicine

## 2015-08-24 VITALS — BP 118/73 | HR 90 | Temp 97.7°F | Ht 68.0 in | Wt 225.4 lb

## 2015-08-24 DIAGNOSIS — R5383 Other fatigue: Secondary | ICD-10-CM | POA: Diagnosis not present

## 2015-08-24 DIAGNOSIS — I10 Essential (primary) hypertension: Secondary | ICD-10-CM | POA: Diagnosis not present

## 2015-08-24 DIAGNOSIS — E118 Type 2 diabetes mellitus with unspecified complications: Secondary | ICD-10-CM | POA: Diagnosis not present

## 2015-08-24 DIAGNOSIS — E785 Hyperlipidemia, unspecified: Secondary | ICD-10-CM | POA: Diagnosis not present

## 2015-08-24 DIAGNOSIS — E1169 Type 2 diabetes mellitus with other specified complication: Secondary | ICD-10-CM | POA: Diagnosis not present

## 2015-08-24 DIAGNOSIS — C61 Malignant neoplasm of prostate: Secondary | ICD-10-CM

## 2015-08-24 DIAGNOSIS — N400 Enlarged prostate without lower urinary tract symptoms: Secondary | ICD-10-CM | POA: Diagnosis not present

## 2015-08-24 DIAGNOSIS — R12 Heartburn: Secondary | ICD-10-CM

## 2015-08-24 DIAGNOSIS — I739 Peripheral vascular disease, unspecified: Secondary | ICD-10-CM | POA: Diagnosis not present

## 2015-08-24 DIAGNOSIS — E559 Vitamin D deficiency, unspecified: Secondary | ICD-10-CM

## 2015-08-24 LAB — POCT GLYCOSYLATED HEMOGLOBIN (HGB A1C): Hemoglobin A1C: 6.4

## 2015-08-24 NOTE — Progress Notes (Signed)
Subjective:    Patient ID: Andrew [name], male    DOB: [birth year], 69 y.o.   MRN: [id number]  HPI Patient is here today for a 4 month follow up of his chronic medical problems. Patient has no other complaints today. This patient has a history of hypertension and hyperlipidemia and diabetes and prostate cancer with vitamin D deficiency. He brings in blood sugars for review and overall these appear to be excellent running from the 115 range up to the 140 range. He sees the podiatrist regularly to help trim his nails and work on the callus on his foot. He denies any chest pain or shortness of breath. He goes and does workouts at the [employer] times weekly. He has no trouble with swallowing including nausea vomiting diarrhea or blood in the stool. He does have occasional bouts of heartburn and only takes the Zantac if needed. He is passing his water without problems. He is up-to-date on seeing the ophthalmologist but does have a return visit which he needs to follow through with. He needs to keep working on his weight and he understands this. His BMI is 33.    Review of Systems  Constitutional: Negative.   HENT: Negative.   Eyes: Negative.   Respiratory: Negative.   Cardiovascular: Negative.   Gastrointestinal: Negative.   Endocrine: Negative.   Genitourinary: Negative.   Musculoskeletal: Negative.   Skin: Negative.   Allergic/Immunologic: Negative.   Neurological: Negative.   Hematological: Negative.   Psychiatric/Behavioral: Negative.         Patient Active Problem List   Diagnosis Date Noted  . History of prostate cancer 04/07/2014  . Diabetes mellitus type 2, uncontrolled ([place]) 09/14/2010  . BPH (benign prostatic hyperplasia) 09/14/2010  . HTN (hypertension) 09/14/2010  . Hyperlipidemia associated with type 2 diabetes mellitus ([place]) 09/14/2010  . Tobacco abuse 09/14/2010   Outpatient Encounter Prescriptions as of 08/24/2015  Medication Sig  . ACCU-CHEK AVIVA PLUS test strip CHECK  BLOOD SUGAR  UP  TO THREE TIMES DAILY  . aspirin 81 MG tablet Take 81 mg by mouth daily.  Marland Kitchen atorvastatin (LIPITOR) 40 MG tablet TAKE 1 TABLET EVERY DAY  . cholecalciferol (VITAMIN D) 1000 UNITS tablet Take 2,000 Units by mouth daily.   . Cinnamon 500 MG capsule Take 500 mg by mouth daily as needed (blood glucose levels).  Marland Kitchen lisinopril-hydrochlorothiazide (PRINZIDE,ZESTORETIC) 20-25 MG tablet TAKE 1 TABLET EVERY DAY  . metFORMIN (GLUCOPHAGE) 1000 MG tablet TAKE 1 TABLET TWICE DAILY WITH MEALS  . naproxen (NAPROSYN) 250 MG tablet Take 1 po BID with food prn pain  . sitaGLIPtin (JANUVIA) 100 MG tablet Take 1 tablet (100 mg total) by mouth daily.  . tamsulosin (FLOMAX) 0.4 MG CAPS capsule TAKE 1 CAPSULE EVERY DAY   No facility-administered encounter medications on file as of 08/24/2015.       Objective:   Physical Exam  Constitutional: He is oriented to person, place, and time. He appears well-developed and well-nourished. No distress.  Pleasant alert and relaxed  HENT:  Head: Normocephalic and atraumatic.  Right Ear: External ear normal.  Left Ear: External ear normal.  Nose: Nose normal.  Mouth/Throat: Oropharynx is clear and moist. No oropharyngeal exudate.  Eyes: Conjunctivae and EOM are normal. Pupils are equal, round, and reactive to light. Right eye exhibits no discharge. Left eye exhibits no discharge. No scleral icterus.  Neck: Normal range of motion. Neck supple. No thyromegaly present.  No bruits thyromegaly or anterior cervical adenopathy  Cardiovascular: Normal  rate, regular rhythm, normal heart sounds and intact distal pulses.   No murmur heard. The dorsalis pedis is diminished on the left. Heart is regular at 72/m  Pulmonary/Chest: Effort normal and breath sounds normal. No respiratory distress. He has no wheezes. He has no rales. He exhibits no tenderness.  Clear anteriorly and posteriorly  Abdominal: Soft. Bowel sounds are normal. He exhibits no mass. There is no  tenderness. There is no rebound and no guarding.  No abdominal tenderness plain enlargement or liver enlargement  Musculoskeletal: Normal range of motion. He exhibits no edema.  Lymphadenopathy:    He has no cervical adenopathy.  Neurological: He is alert and oriented to person, place, and time. He has normal reflexes. No cranial nerve deficit.  Skin: Skin is warm and dry. No rash noted.  Psychiatric: He has a normal mood and affect. His behavior is normal. Judgment and thought content normal.  Nursing note and vitals reviewed.  BP 118/73 mmHg  Pulse 90  Temp(Src) 97.7 F (36.5 C) (Oral)  Ht '5\' 8"'  (1.727 m)  Wt 225 lb 6.4 oz (102.241 kg)  BMI 34.28 kg/m2        Assessment & Plan:  1. Type 2 diabetes mellitus with complication, without long-term current use of insulin ([place]) -Continue aggressive therapeutic lifestyle changes which include diet and exercise. Continue with weight loss. - Ambulatory referral to Podiatry - POCT glycosylated hemoglobin (Hb A1C)  2. Essential hypertension -The blood pressure is good today the patient should continue with current treatment - BMP8+EGFR  3. Hyperlipidemia associated with type 2 diabetes mellitus ([place]) -Continue with atorvastatin pending results of lab work - Hepatic function panel - Lipid panel  4. Vitamin D deficiency -Continue with vitamin D replacement pending results of lab work - VITAMIN D 25 Hydroxy (Vit-D Deficiency, Fractures)  5. Other fatigue - CBC with Differential/Platelet  6. BPH (benign prostatic hyperplasia) -Continue follow-up with urology  7. Prostate cancer [facility]) -Continue follow-up with urology  8. Heartburn -Take generic Zantac 150 once or twice daily before breakfast and supper. The patient was explained he could get this at a very low cost at [place].  9. Peripheral vascular insufficiency (HCC) -Continue to walk and exercise as much as possible  No orders of the defined types were placed in this  encounter.   Patient Instructions  Continue current medications. Continue good therapeutic lifestyle changes which include good diet and exercise. Fall precautions discussed with patient. If an FOBT was given today- please return it to our front desk. If you are over 65 years old - you may need Prevnar 59 or the adult Pneumonia vaccine.  Flu Shots will be available at our office starting mid- September. Please call and schedule a FLU CLINIC APPOINTMENT.                        Medicare Annual Wellness Visit  [facility] and the medical providers at [facility] strive to bring you the best medical care.  In doing so we not only want to address your current medical conditions and concerns but also to detect new conditions early and prevent illness, disease and health-related problems.    Medicare offers a yearly Wellness Visit which allows our clinical staff to assess your need for preventative services including immunizations, lifestyle education, counseling to decrease risk of preventable diseases and screening for fall risk and other medical concerns.    This visit is provided free of charge (no  copay) for all Medicare recipients. The clinical pharmacists at [facility] have begun to conduct these Wellness Visits which will also include a thorough review of all your medications.    As you primary medical provider recommend that you make an appointment for your Annual Wellness Visit if you have not done so already this year.  You may set up this appointment before you leave today or you may call back [phone]) and schedule an appointment.  Please make sure when you call that you mention that you are scheduling your Annual Wellness Visit with the clinical pharmacist so that the appointment may be made for the proper length of time.    The patient should keep his follow-up appointment with his eye doctor and with his podiatrist. He needs to stay on  his medicines and take them regularly and keep monitoring his blood sugars and his feet He should continue her regular exercise program drinking more water and watching his diet much more closely. He was told that he could purchase Zantac or ranitidine 150 at the [facility] store and he can get the equate brand and pain no more than $7 for 6 week supply--- he should take the Zantac once or twice daily before eating   Arrie Senate MD

## 2015-08-24 NOTE — Patient Instructions (Addendum)
Continue current medications. Continue good therapeutic lifestyle changes which include good diet and exercise. Fall precautions discussed with patient. If an FOBT was given today- please return it to our front desk. If you are over 70 years old - you may need Prevnar 24 or the adult Pneumonia vaccine.  Flu Shots will be available at our office starting mid- September. Please call and schedule a FLU CLINIC APPOINTMENT.                        Medicare Annual Wellness Visit  [facility] and the medical providers at [facility] strive to bring you the best medical care.  In doing so we not only want to address your current medical conditions and concerns but also to detect new conditions early and prevent illness, disease and health-related problems.    Medicare offers a yearly Wellness Visit which allows our clinical staff to assess your need for preventative services including immunizations, lifestyle education, counseling to decrease risk of preventable diseases and screening for fall risk and other medical concerns.    This visit is provided free of charge (no copay) for all Medicare recipients. The clinical pharmacists at [facility] have begun to conduct these Wellness Visits which will also include a thorough review of all your medications.    As you primary medical provider recommend that you make an appointment for your Annual Wellness Visit if you have not done so already this year.  You may set up this appointment before you leave today or you may call back [phone] and schedule an appointment.  Please make sure when you call that you mention that you are scheduling your Annual Wellness Visit with the clinical pharmacist so that the appointment may be made for the proper length of time.    The patient should keep his follow-up appointment with his eye doctor and with his podiatrist. He needs to stay on his medicines and take them regularly  and keep monitoring his blood sugars and his feet He should continue her regular exercise program drinking more water and watching his diet much more closely. He was told that he could purchase Zantac or ranitidine 150 at the [facility] store and he can get the equate brand and pain no more than $7 for 6 week supply--- he should take the Zantac once or twice daily before eating

## 2015-08-25 LAB — LIPID PANEL
Chol/HDL Ratio: 4.1 ratio units (ref 0.0–5.0)
Cholesterol, Total: 126 mg/dL (ref 100–199)
HDL: 31 mg/dL — ABNORMAL LOW (ref >39)
LDL Calculated: 56 mg/dL (ref 0–99)
Triglycerides: 195 mg/dL — ABNORMAL HIGH (ref 0–149)
VLDL CHOLESTEROL CAL: 39 mg/dL (ref 5–40)

## 2015-08-25 LAB — CBC WITH DIFFERENTIAL/PLATELET
BASOS ABS: 0 10*3/uL (ref 0.0–0.2)
Basos: 0 %
EOS (ABSOLUTE): 0.2 10*3/uL (ref 0.0–0.4)
Eos: 3 %
HEMOGLOBIN: 12.8 g/dL (ref 12.6–17.7)
Hematocrit: 38.1 % (ref 37.5–51.0)
Immature Grans (Abs): 0 10*3/uL (ref 0.0–0.1)
Immature Granulocytes: 0 %
LYMPHS ABS: 2.6 10*3/uL (ref 0.7–3.1)
Lymphs: 36 %
MCH: 29.8 pg (ref 26.6–33.0)
MCHC: 33.6 g/dL (ref 31.5–35.7)
MCV: 89 fL (ref 79–97)
MONOCYTES: 6 %
MONOS ABS: 0.5 10*3/uL (ref 0.1–0.9)
NEUTROS ABS: 3.9 10*3/uL (ref 1.4–7.0)
Neutrophils: 55 %
Platelets: 251 10*3/uL (ref 150–379)
RBC: 4.3 x10E6/uL (ref 4.14–5.80)
RDW: 13.9 % (ref 12.3–15.4)
WBC: 7.2 10*3/uL (ref 3.4–10.8)

## 2015-08-25 LAB — BMP8+EGFR
BUN/Creatinine Ratio: 15 (ref 10–22)
BUN: 16 mg/dL (ref 8–27)
CO2: 21 mmol/L (ref 18–29)
Calcium: 9.1 mg/dL (ref 8.6–10.2)
Chloride: 97 mmol/L (ref 96–106)
Creatinine, Ser: 1.04 mg/dL (ref 0.76–1.27)
GFR calc Af Amer: 84 mL/min/{1.73_m2} (ref >59)
GFR calc non Af Amer: 73 mL/min/{1.73_m2} (ref >59)
Glucose: 132 mg/dL — ABNORMAL HIGH (ref 65–99)
POTASSIUM: 3.9 mmol/L (ref 3.5–5.2)
Sodium: 140 mmol/L (ref 134–144)

## 2015-08-25 LAB — VITAMIN D 25 HYDROXY (VIT D DEFICIENCY, FRACTURES): Vit D, 25-Hydroxy: 37.2 ng/mL (ref 30.0–100.0)

## 2015-08-25 LAB — HEPATIC FUNCTION PANEL
ALT: 17 IU/L (ref 0–44)
AST: 27 IU/L (ref 0–40)
Albumin: 4.2 g/dL (ref 3.6–4.8)
Alkaline Phosphatase: 85 IU/L (ref 39–117)
BILIRUBIN TOTAL: 0.5 mg/dL (ref 0.0–1.2)
Bilirubin, Direct: 0.12 mg/dL (ref 0.00–0.40)
TOTAL PROTEIN: 6.7 g/dL (ref 6.0–8.5)

## 2015-08-28 ENCOUNTER — Other Ambulatory Visit: Payer: Self-pay | Admitting: Family Medicine

## 2015-09-04 DIAGNOSIS — B351 Tinea unguium: Secondary | ICD-10-CM | POA: Diagnosis not present

## 2015-09-04 DIAGNOSIS — L84 Corns and callosities: Secondary | ICD-10-CM | POA: Diagnosis not present

## 2015-09-04 DIAGNOSIS — E1151 Type 2 diabetes mellitus with diabetic peripheral angiopathy without gangrene: Secondary | ICD-10-CM | POA: Diagnosis not present

## 2015-10-29 ENCOUNTER — Telehealth: Payer: Self-pay | Admitting: Family Medicine

## 2015-10-31 ENCOUNTER — Other Ambulatory Visit: Payer: Self-pay | Admitting: Family Medicine

## 2015-11-27 DIAGNOSIS — C61 Malignant neoplasm of prostate: Secondary | ICD-10-CM | POA: Diagnosis not present

## 2015-11-30 ENCOUNTER — Telehealth: Payer: Self-pay | Admitting: Family Medicine

## 2015-11-30 DIAGNOSIS — C61 Malignant neoplasm of prostate: Secondary | ICD-10-CM

## 2015-11-30 DIAGNOSIS — N400 Enlarged prostate without lower urinary tract symptoms: Secondary | ICD-10-CM

## 2015-11-30 NOTE — Telephone Encounter (Signed)
Referral placed and patient aware 

## 2015-11-30 NOTE — Telephone Encounter (Signed)
Patient was informed we do not have any Januvia samples at this time

## 2015-12-03 DIAGNOSIS — R3915 Urgency of urination: Secondary | ICD-10-CM | POA: Diagnosis not present

## 2015-12-03 DIAGNOSIS — C61 Malignant neoplasm of prostate: Secondary | ICD-10-CM | POA: Diagnosis not present

## 2015-12-03 DIAGNOSIS — N359 Urethral stricture, unspecified: Secondary | ICD-10-CM | POA: Diagnosis not present

## 2015-12-03 NOTE — Telephone Encounter (Signed)
done

## 2015-12-04 ENCOUNTER — Telehealth: Payer: Self-pay | Admitting: Family Medicine

## 2015-12-04 MED ORDER — SITAGLIPTIN PHOSPHATE 100 MG PO TABS: 100.0000 mg | ORAL_TABLET | Freq: Every day | ORAL | Status: DC

## 2015-12-04 NOTE — Telephone Encounter (Signed)
lmovm that samples are at front desk ready for pickup

## 2015-12-27 ENCOUNTER — Encounter: Payer: Self-pay | Admitting: Family Medicine

## 2015-12-27 ENCOUNTER — Ambulatory Visit (INDEPENDENT_AMBULATORY_CARE_PROVIDER_SITE_OTHER): Payer: Commercial Managed Care - HMO | Admitting: Family Medicine

## 2015-12-27 VITALS — BP 121/67 | HR 77 | Temp 98.0°F | Ht 68.0 in | Wt 227.0 lb

## 2015-12-27 DIAGNOSIS — E118 Type 2 diabetes mellitus with unspecified complications: Secondary | ICD-10-CM

## 2015-12-27 DIAGNOSIS — E669 Obesity, unspecified: Secondary | ICD-10-CM | POA: Diagnosis not present

## 2015-12-27 DIAGNOSIS — E1169 Type 2 diabetes mellitus with other specified complication: Secondary | ICD-10-CM | POA: Diagnosis not present

## 2015-12-27 DIAGNOSIS — E785 Hyperlipidemia, unspecified: Secondary | ICD-10-CM | POA: Diagnosis not present

## 2015-12-27 DIAGNOSIS — I10 Essential (primary) hypertension: Secondary | ICD-10-CM | POA: Diagnosis not present

## 2015-12-27 DIAGNOSIS — E559 Vitamin D deficiency, unspecified: Secondary | ICD-10-CM

## 2015-12-27 NOTE — Patient Instructions (Addendum)
Medicare Annual Wellness Visit  [facility] and the medical providers at [facility] strive to bring you the best medical care.  In doing so we not only want to address your current medical conditions and concerns but also to detect new conditions early and prevent illness, disease and health-related problems.    Medicare offers a yearly Wellness Visit which allows our clinical staff to assess your need for preventative services including immunizations, lifestyle education, counseling to decrease risk of preventable diseases and screening for fall risk and other medical concerns.    This visit is provided free of charge (no copay) for all Medicare recipients. The clinical pharmacists at [facility] have begun to conduct these Wellness Visits which will also include a thorough review of all your medications.    As you primary medical provider recommend that you make an appointment for your Annual Wellness Visit if you have not done so already this year.  You may set up this appointment before you leave today or you may call back [phone] and schedule an appointment.  Please make sure when you call that you mention that you are scheduling your Annual Wellness Visit with the clinical pharmacist so that the appointment may be made for the proper length of time.     Continue current medications. Continue good therapeutic lifestyle changes which include good diet and exercise. Fall precautions discussed with patient. If an FOBT was given today- please return it to our front desk. If you are over 25 years old - you may need Prevnar 89 or the adult Pneumonia vaccine.  **Flu shots are available--- please call and schedule a FLU-CLINIC appointment**  After your visit with [place] today you will receive a survey in the mail or online from [employer] regarding your care with [place]. Please take a moment to fill this out. Your feedback is very  important to [place] as you can help [place] better understand your patient needs as well as improve your experience and satisfaction. WE CARE ABOUT YOU!!!   You can purchase ranitidine 150 mg over-the-counter at [facility] under the equate brand and this can be taken 1 twice daily before breakfast and supper for heartburn

## 2015-12-27 NOTE — Progress Notes (Signed)
Subjective:    Patient ID: Andrew [name], male    DOB: March 26, 1946, 70 y.o.   MRN: [id number]  HPI Pt here for follow up and management of chronic medical problems which includes diabetes, lipids and hypertension. He is taking medications regularly.The patient doing well today with no specific complaints. He will get lab work done today. He sees the urologist every 6 months for his prostate exam and PSA. The patient denies any chest pain or shortness of breath or palpitations. He does have occasional heartburn and only takes ranitidine when necessary. He has not seen any blood in the stool or had any black tarry bowel movements. He is passing his water without problems. [facility] He is due to have his eye exam but this is been postponed until November. He will make sure that they send [place] a copy of the report.     Patient Active Problem List   Diagnosis Date Noted  . Vitamin D deficiency 08/24/2015  . Prostate cancer ([place]) 04/07/2014  . Diabetes mellitus type 2, uncontrolled ([place]) 09/14/2010  . BPH (benign prostatic hyperplasia) 09/14/2010  . HTN (hypertension) 09/14/2010  . Hyperlipidemia associated with type 2 diabetes mellitus ([place]) 09/14/2010  . Tobacco abuse 09/14/2010   Outpatient Encounter Prescriptions as of 12/27/2015  Medication Sig  . ACCU-CHEK AVIVA PLUS test strip CHECK BLOOD SUGAR  UP  TO THREE TIMES DAILY  . aspirin 81 MG tablet Take 81 mg by mouth daily.  Marland Kitchen atorvastatin (LIPITOR) 40 MG tablet TAKE 1 TABLET EVERY DAY  . cholecalciferol (VITAMIN D) 1000 UNITS tablet Take 2,000 Units by mouth daily.   . Cinnamon 500 MG capsule Take 500 mg by mouth daily as needed (blood glucose levels).  Marland Kitchen lisinopril-hydrochlorothiazide (PRINZIDE,ZESTORETIC) 20-25 MG tablet TAKE 1 TABLET EVERY DAY  . metFORMIN (GLUCOPHAGE) 1000 MG tablet TAKE 1 TABLET TWICE DAILY WITH MEALS  . naproxen (NAPROSYN) 250 MG tablet Take 1 po BID with food prn pain  . sitaGLIPtin (JANUVIA) 100  MG tablet Take 1 tablet (100 mg total) by mouth daily.  . tamsulosin (FLOMAX) 0.4 MG CAPS capsule TAKE 1 CAPSULE EVERY DAY   No facility-administered encounter medications on file as of 12/27/2015.      Review of Systems  Constitutional: Negative.   HENT: Negative.   Eyes: Negative.   Respiratory: Negative.   Cardiovascular: Negative.   Gastrointestinal: Negative.   Endocrine: Negative.   Genitourinary: Negative.   Musculoskeletal: Negative.   Skin: Negative.   Allergic/Immunologic: Negative.   Neurological: Negative.   Hematological: Negative.   Psychiatric/Behavioral: Negative.        Objective:   Physical Exam  Constitutional: He is oriented to person, place, and time. He appears well-developed and well-nourished. No distress.  Patient is pleasant and alert  HENT:  Head: Normocephalic and atraumatic.  Right Ear: External ear normal.  Left Ear: External ear normal.  Nose: Nose normal.  Mouth/Throat: Oropharynx is clear and moist. No oropharyngeal exudate.  Eyes: Conjunctivae and EOM are normal. Pupils are equal, round, and reactive to light. Right eye exhibits no discharge. Left eye exhibits no discharge. No scleral icterus.  Neck: Normal range of motion. Neck supple. No thyromegaly present.  No bruits or thyromegaly  Cardiovascular: Normal rate, regular rhythm, normal heart sounds and intact distal pulses.   No murmur heard. Heart has a regular rate and rhythm at 72/m  Pulmonary/Chest: Effort normal and breath sounds normal. No respiratory distress. He has no wheezes.  He has no rales. He exhibits no tenderness.  Clear anteriorly and posteriorly  Abdominal: Soft. Bowel sounds are normal. He exhibits no mass. There is no tenderness. There is no rebound and no guarding.  Abdominal obesity without tenderness masses or organ enlargement  Genitourinary:  Deferred to urology  Musculoskeletal: Normal range of motion. He exhibits no edema or tenderness.  Lymphadenopathy:     He has no cervical adenopathy.  Neurological: He is alert and oriented to person, place, and time. He has normal reflexes. No cranial nerve deficit.  Skin: Skin is warm and dry. No rash noted.  Psychiatric: He has a normal mood and affect. His behavior is normal. Judgment and thought content normal.  Nursing note and vitals reviewed.  BP 121/67 mmHg  Pulse 77  Temp(Src) 98 F (36.7 C) (Oral)  Ht '5\' 8"'  (1.727 m)  Wt 227 lb (102.967 kg)  BMI 34.52 kg/m2        Assessment & Plan:  1. Type 2 diabetes mellitus with complication, without long-term current use of insulin ([place]) -Continue with current treatment and aggressive therapeutic lifestyle changes pending results of lab work - CBC with Differential/Platelet; Future - Bayer DCA Hb A1c Waived; Future  2. Essential hypertension -The blood pressure is good today and he will continue with current treatment - BMP8+EGFR; Future - CBC with Differential/Platelet; Future - Hepatic function panel; Future  3. Hyperlipidemia associated with type 2 diabetes mellitus ([place]) -Continue with atorvastatin pending results of lab work - CBC with Differential/Platelet; Future - NMR, lipoprofile; Future  4. Vitamin D deficiency -Continue with vitamin D replacement pending results of lab work - CBC with Differential/Platelet; Future - VITAMIN D 25 Hydroxy (Vit-D Deficiency, Fractures); Future  5. Obesity -Work more aggressively on weight loss with more exercise and better diet habits  Patient Instructions                       Medicare Annual Wellness Visit  [facility] and the medical providers at [facility] strive to bring you the best medical care.  In doing so we not only want to address your current medical conditions and concerns but also to detect new conditions early and prevent illness, disease and health-related problems.    Medicare offers a yearly Wellness Visit which allows our clinical staff to assess your  need for preventative services including immunizations, lifestyle education, counseling to decrease risk of preventable diseases and screening for fall risk and other medical concerns.    This visit is provided free of charge (no copay) for all Medicare recipients. The clinical pharmacists at [facility] have begun to conduct these Wellness Visits which will also include a thorough review of all your medications.    As you primary medical provider recommend that you make an appointment for your Annual Wellness Visit if you have not done so already this year.  You may set up this appointment before you leave today or you may call back [phone] and schedule an appointment.  Please make sure when you call that you mention that you are scheduling your Annual Wellness Visit with the clinical pharmacist so that the appointment may be made for the proper length of time.     Continue current medications. Continue good therapeutic lifestyle changes which include good diet and exercise. Fall precautions discussed with patient. If an FOBT was given today- please return it to our front desk. If you are over 64 years old -  you may need Prevnar 13 or the adult Pneumonia vaccine.  **Flu shots are available--- please call and schedule a FLU-CLINIC appointment**  After your visit with [place] today you will receive a survey in the mail or online from [employer] regarding your care with [place]. Please take a moment to fill this out. Your feedback is very important to [place] as you can help [place] better understand your patient needs as well as improve your experience and satisfaction. WE CARE ABOUT YOU!!!      Arrie Senate MD

## 2016-01-03 ENCOUNTER — Other Ambulatory Visit: Payer: Self-pay | Admitting: Family Medicine

## 2016-01-03 ENCOUNTER — Other Ambulatory Visit: Payer: Commercial Managed Care - HMO

## 2016-01-03 DIAGNOSIS — E1169 Type 2 diabetes mellitus with other specified complication: Secondary | ICD-10-CM

## 2016-01-03 DIAGNOSIS — E118 Type 2 diabetes mellitus with unspecified complications: Secondary | ICD-10-CM | POA: Diagnosis not present

## 2016-01-03 DIAGNOSIS — I10 Essential (primary) hypertension: Secondary | ICD-10-CM

## 2016-01-03 DIAGNOSIS — E785 Hyperlipidemia, unspecified: Secondary | ICD-10-CM | POA: Diagnosis not present

## 2016-01-03 DIAGNOSIS — E559 Vitamin D deficiency, unspecified: Secondary | ICD-10-CM | POA: Diagnosis not present

## 2016-01-03 LAB — BAYER DCA HB A1C WAIVED: HB A1C (BAYER DCA - WAIVED): 6.2 % (ref <7.0)

## 2016-01-04 LAB — BMP8+EGFR
BUN / CREAT RATIO: 15 (ref 10–24)
BUN: 15 mg/dL (ref 8–27)
CALCIUM: 9 mg/dL (ref 8.6–10.2)
CO2: 23 mmol/L (ref 18–29)
Chloride: 102 mmol/L (ref 96–106)
Creatinine, Ser: 0.98 mg/dL (ref 0.76–1.27)
GFR, EST AFRICAN AMERICAN: 90 mL/min/{1.73_m2} (ref >59)
GFR, EST NON AFRICAN AMERICAN: 78 mL/min/{1.73_m2} (ref >59)
Glucose: 126 mg/dL — ABNORMAL HIGH (ref 65–99)
Potassium: 4.2 mmol/L (ref 3.5–5.2)
Sodium: 143 mmol/L (ref 134–144)

## 2016-01-04 LAB — CBC WITH DIFFERENTIAL/PLATELET
BASOS: 1 %
Basophils Absolute: 0 10*3/uL (ref 0.0–0.2)
EOS (ABSOLUTE): 0.2 10*3/uL (ref 0.0–0.4)
Eos: 3 %
HEMATOCRIT: 36.4 % — AB (ref 37.5–51.0)
HEMOGLOBIN: 11.8 g/dL — AB (ref 12.6–17.7)
IMMATURE GRANS (ABS): 0 10*3/uL (ref 0.0–0.1)
Immature Granulocytes: 0 %
LYMPHS ABS: 2.7 10*3/uL (ref 0.7–3.1)
LYMPHS: 40 %
MCH: 29.4 pg (ref 26.6–33.0)
MCHC: 32.4 g/dL (ref 31.5–35.7)
MCV: 91 fL (ref 79–97)
MONOCYTES: 8 %
Monocytes Absolute: 0.5 10*3/uL (ref 0.1–0.9)
NEUTROS ABS: 3.3 10*3/uL (ref 1.4–7.0)
Neutrophils: 48 %
Platelets: 228 10*3/uL (ref 150–379)
RBC: 4.02 x10E6/uL — ABNORMAL LOW (ref 4.14–5.80)
RDW: 14.7 % (ref 12.3–15.4)
WBC: 6.7 10*3/uL (ref 3.4–10.8)

## 2016-01-04 LAB — NMR, LIPOPROFILE
CHOLESTEROL: 124 mg/dL (ref 100–199)
HDL Cholesterol by NMR: 31 mg/dL — ABNORMAL LOW (ref >39)
HDL Particle Number: 22.3 umol/L — ABNORMAL LOW (ref >=30.5)
LDL PARTICLE NUMBER: 942 nmol/L (ref <1000)
LDL SIZE: 20.2 nm (ref >20.5)
LDL-C: 65 mg/dL (ref 0–99)
LP-IR SCORE: 75 — AB (ref <=45)
Small LDL Particle Number: 531 nmol/L — ABNORMAL HIGH (ref <=527)
Triglycerides by NMR: 142 mg/dL (ref 0–149)

## 2016-01-04 LAB — HEPATIC FUNCTION PANEL
ALK PHOS: 82 IU/L (ref 39–117)
ALT: 24 IU/L (ref 0–44)
AST: 34 IU/L (ref 0–40)
Albumin: 3.9 g/dL (ref 3.5–4.8)
BILIRUBIN, DIRECT: 0.08 mg/dL (ref 0.00–0.40)
Bilirubin Total: 0.3 mg/dL (ref 0.0–1.2)
Total Protein: 6.7 g/dL (ref 6.0–8.5)

## 2016-01-04 LAB — VITAMIN D 25 HYDROXY (VIT D DEFICIENCY, FRACTURES): VIT D 25 HYDROXY: 37 ng/mL (ref 30.0–100.0)

## 2016-01-08 DIAGNOSIS — N359 Urethral stricture, unspecified: Secondary | ICD-10-CM | POA: Diagnosis not present

## 2016-01-08 DIAGNOSIS — R3915 Urgency of urination: Secondary | ICD-10-CM | POA: Diagnosis not present

## 2016-01-14 ENCOUNTER — Other Ambulatory Visit: Payer: Self-pay | Admitting: Family Medicine

## 2016-01-16 DIAGNOSIS — Z7984 Long term (current) use of oral hypoglycemic drugs: Secondary | ICD-10-CM | POA: Diagnosis not present

## 2016-01-16 DIAGNOSIS — Z8546 Personal history of malignant neoplasm of prostate: Secondary | ICD-10-CM | POA: Diagnosis not present

## 2016-01-16 DIAGNOSIS — Z7982 Long term (current) use of aspirin: Secondary | ICD-10-CM | POA: Diagnosis not present

## 2016-01-16 DIAGNOSIS — E119 Type 2 diabetes mellitus without complications: Secondary | ICD-10-CM | POA: Diagnosis not present

## 2016-01-16 DIAGNOSIS — E785 Hyperlipidemia, unspecified: Secondary | ICD-10-CM | POA: Diagnosis not present

## 2016-01-16 DIAGNOSIS — F17210 Nicotine dependence, cigarettes, uncomplicated: Secondary | ICD-10-CM | POA: Diagnosis not present

## 2016-01-16 DIAGNOSIS — Z79899 Other long term (current) drug therapy: Secondary | ICD-10-CM | POA: Diagnosis not present

## 2016-01-16 DIAGNOSIS — N359 Urethral stricture, unspecified: Secondary | ICD-10-CM | POA: Diagnosis not present

## 2016-01-16 DIAGNOSIS — R3915 Urgency of urination: Secondary | ICD-10-CM | POA: Diagnosis not present

## 2016-01-16 DIAGNOSIS — I10 Essential (primary) hypertension: Secondary | ICD-10-CM | POA: Diagnosis not present

## 2016-01-22 DIAGNOSIS — R339 Retention of urine, unspecified: Secondary | ICD-10-CM | POA: Diagnosis not present

## 2016-01-22 DIAGNOSIS — R3916 Straining to void: Secondary | ICD-10-CM | POA: Diagnosis not present

## 2016-01-29 ENCOUNTER — Other Ambulatory Visit: Payer: Self-pay | Admitting: Family Medicine

## 2016-01-29 DIAGNOSIS — R339 Retention of urine, unspecified: Secondary | ICD-10-CM | POA: Diagnosis not present

## 2016-01-29 DIAGNOSIS — R3916 Straining to void: Secondary | ICD-10-CM | POA: Diagnosis not present

## 2016-01-29 NOTE — Telephone Encounter (Signed)
Patient aware rx placed up front.

## 2016-03-06 ENCOUNTER — Other Ambulatory Visit: Payer: Self-pay | Admitting: Family Medicine

## 2016-03-06 NOTE — Telephone Encounter (Signed)
Pt aware samples ready.  

## 2016-03-10 ENCOUNTER — Other Ambulatory Visit: Payer: Self-pay | Admitting: Family Medicine

## 2016-04-22 ENCOUNTER — Telehealth: Payer: Self-pay | Admitting: Family Medicine

## 2016-04-22 DIAGNOSIS — E119 Type 2 diabetes mellitus without complications: Secondary | ICD-10-CM

## 2016-04-22 NOTE — Telephone Encounter (Signed)
Order placed for referral.   Patient is long time patient of dr. Tally Due

## 2016-04-25 ENCOUNTER — Telehealth: Payer: Self-pay | Admitting: Family Medicine

## 2016-04-30 DIAGNOSIS — H40013 Open angle with borderline findings, low risk, bilateral: Secondary | ICD-10-CM | POA: Diagnosis not present

## 2016-04-30 DIAGNOSIS — H25813 Combined forms of age-related cataract, bilateral: Secondary | ICD-10-CM | POA: Diagnosis not present

## 2016-04-30 DIAGNOSIS — Z7984 Long term (current) use of oral hypoglycemic drugs: Secondary | ICD-10-CM | POA: Diagnosis not present

## 2016-04-30 DIAGNOSIS — E119 Type 2 diabetes mellitus without complications: Secondary | ICD-10-CM | POA: Diagnosis not present

## 2016-04-30 DIAGNOSIS — R69 Illness, unspecified: Secondary | ICD-10-CM | POA: Diagnosis not present

## 2016-05-06 ENCOUNTER — Ambulatory Visit: Payer: Commercial Managed Care - HMO | Admitting: Family Medicine

## 2016-05-13 ENCOUNTER — Ambulatory Visit (INDEPENDENT_AMBULATORY_CARE_PROVIDER_SITE_OTHER): Payer: Commercial Managed Care - HMO

## 2016-05-13 DIAGNOSIS — L84 Corns and callosities: Secondary | ICD-10-CM | POA: Diagnosis not present

## 2016-05-13 DIAGNOSIS — B351 Tinea unguium: Secondary | ICD-10-CM | POA: Diagnosis not present

## 2016-05-13 DIAGNOSIS — Z23 Encounter for immunization: Secondary | ICD-10-CM | POA: Diagnosis not present

## 2016-05-13 DIAGNOSIS — E1151 Type 2 diabetes mellitus with diabetic peripheral angiopathy without gangrene: Secondary | ICD-10-CM | POA: Diagnosis not present

## 2016-05-14 ENCOUNTER — Other Ambulatory Visit: Payer: Self-pay | Admitting: Family Medicine

## 2016-05-15 ENCOUNTER — Ambulatory Visit: Payer: Commercial Managed Care - HMO | Admitting: Family Medicine

## 2016-06-04 ENCOUNTER — Telehealth: Payer: Self-pay | Admitting: Family Medicine

## 2016-06-04 NOTE — Telephone Encounter (Signed)
TC to wife that we do not have samples at this time. Pt will call back if he is needed Rx called to pharmacy

## 2016-06-09 ENCOUNTER — Telehealth: Payer: Self-pay | Admitting: Family Medicine

## 2016-06-09 ENCOUNTER — Encounter: Payer: Self-pay | Admitting: *Deleted

## 2016-06-09 NOTE — Telephone Encounter (Signed)
Left detailed message that we are currently out of the Januvia samples and to call back with any further questions or concerns.

## 2016-06-13 ENCOUNTER — Ambulatory Visit (INDEPENDENT_AMBULATORY_CARE_PROVIDER_SITE_OTHER): Payer: Commercial Managed Care - HMO | Admitting: Family Medicine

## 2016-06-13 ENCOUNTER — Encounter: Payer: Self-pay | Admitting: Family Medicine

## 2016-06-13 VITALS — BP 124/72 | HR 77 | Temp 97.3°F | Ht 68.0 in | Wt 230.0 lb

## 2016-06-13 DIAGNOSIS — N400 Enlarged prostate without lower urinary tract symptoms: Secondary | ICD-10-CM | POA: Diagnosis not present

## 2016-06-13 DIAGNOSIS — E119 Type 2 diabetes mellitus without complications: Secondary | ICD-10-CM

## 2016-06-13 DIAGNOSIS — E785 Hyperlipidemia, unspecified: Secondary | ICD-10-CM | POA: Diagnosis not present

## 2016-06-13 DIAGNOSIS — Z794 Long term (current) use of insulin: Secondary | ICD-10-CM

## 2016-06-13 DIAGNOSIS — I10 Essential (primary) hypertension: Secondary | ICD-10-CM

## 2016-06-13 DIAGNOSIS — I739 Peripheral vascular disease, unspecified: Secondary | ICD-10-CM | POA: Insufficient documentation

## 2016-06-13 DIAGNOSIS — N5201 Erectile dysfunction due to arterial insufficiency: Secondary | ICD-10-CM

## 2016-06-13 DIAGNOSIS — E1169 Type 2 diabetes mellitus with other specified complication: Secondary | ICD-10-CM | POA: Diagnosis not present

## 2016-06-13 DIAGNOSIS — C61 Malignant neoplasm of prostate: Secondary | ICD-10-CM

## 2016-06-13 DIAGNOSIS — E559 Vitamin D deficiency, unspecified: Secondary | ICD-10-CM

## 2016-06-13 LAB — BAYER DCA HB A1C WAIVED: HB A1C (BAYER DCA - WAIVED): 6.6 % (ref <7.0)

## 2016-06-13 NOTE — Patient Instructions (Addendum)
Medicare Annual Wellness Visit  [facility] and the medical providers at [facility] strive to bring you the best medical care.  In doing so we not only want to address your current medical conditions and concerns but also to detect new conditions early and prevent illness, disease and health-related problems.    Medicare offers a yearly Wellness Visit which allows our clinical staff to assess your need for preventative services including immunizations, lifestyle education, counseling to decrease risk of preventable diseases and screening for fall risk and other medical concerns.    This visit is provided free of charge (no copay) for all Medicare recipients. The clinical pharmacists at [facility] have begun to conduct these Wellness Visits which will also include a thorough review of all your medications.    As you primary medical provider recommend that you make an appointment for your Annual Wellness Visit if you have not done so already this year.  You may set up this appointment before you leave today or you may call back [phone] and schedule an appointment.  Please make sure when you call that you mention that you are scheduling your Annual Wellness Visit with the clinical pharmacist so that the appointment may be made for the proper length of time.  m    Continue current medications. Continue good therapeutic lifestyle changes which include good diet and exercise. Fall precautions discussed with patient. If an FOBT was given today- please return it to our front desk. If you are over 53 years old - you may need Prevnar 12 or the adult Pneumonia vaccine.  **Flu shots are available--- please call and schedule a FLU-CLINIC appointment**  After your visit with [place] today you will receive a survey in the mail or online from [employer] regarding your care with [place]. Please take a moment to fill this out. Your feedback is very  important to [place] as you can help [place] better understand your patient needs as well as improve your experience and satisfaction. WE CARE ABOUT YOU!!!   Continue to follow-up aggressive therapeutic lifestyle changes Drink plenty of fluids this winter and keep the house as cool as possible

## 2016-06-13 NOTE — Progress Notes (Signed)
Subjective:    Patient ID: Andrew [name], male    DOB: 1945/07/07, 70 y.o.   MRN: [id number]  HPI Pt here for follow up and management of chronic medical problems which includes diabetes, hyperlipidemia and hypertension. He is taking medications regularly.The patient is doing well today with no specific complaints. He denies any chest pain or shortness of breath. He denies any trouble with his stomach other than some indigestion and he periodically takes ranitidine for this but does not take it regularly. He is passing his water well. He says that he sees the urologist about every 6 months. He last had his eyes examined in September and has another visit scheduled in coming up soon. He was reminded to have them to send [place] a copy of his report to this office once he gets his exam. He sees a Dr. Janey Genta?? in [place] for his prostate follow-up.      Patient Active Problem List   Diagnosis Date Noted  . Vitamin D deficiency 08/24/2015  . Prostate cancer ([facility]) 04/07/2014  . Diabetes mellitus type 2, uncontrolled ([place]) 09/14/2010  . BPH (benign prostatic hyperplasia) 09/14/2010  . HTN (hypertension) 09/14/2010  . Hyperlipidemia associated with type 2 diabetes mellitus ([place]) 09/14/2010  . Tobacco abuse 09/14/2010   Outpatient Encounter Prescriptions as of 06/13/2016  Medication Sig  . ACCU-CHEK AVIVA PLUS test strip CHECK BLOOD SUGAR  UP  TO THREE TIMES DAILY  . aspirin 81 MG tablet Take 81 mg by mouth daily.  Marland Kitchen atorvastatin (LIPITOR) 40 MG tablet TAKE 1 TABLET EVERY DAY  . cholecalciferol (VITAMIN D) 1000 UNITS tablet Take 2,000 Units by mouth daily.   . Cinnamon 500 MG capsule Take 500 mg by mouth daily as needed (blood glucose levels).  Marland Kitchen lisinopril-hydrochlorothiazide (PRINZIDE,ZESTORETIC) 20-25 MG tablet TAKE 1 TABLET EVERY DAY  . metFORMIN (GLUCOPHAGE) 1000 MG tablet TAKE 1 TABLET TWICE DAILY WITH MEALS  . naproxen (NAPROSYN) 250 MG tablet Take 1 po BID with food prn pain  . sitaGLIPtin  (JANUVIA) 100 MG tablet Take 1 tablet (100 mg total) by mouth daily.  . tamsulosin (FLOMAX) 0.4 MG CAPS capsule TAKE 1 CAPSULE EVERY DAY   No facility-administered encounter medications on file as of 06/13/2016.       Review of Systems  Constitutional: Negative.   HENT: Negative.   Eyes: Negative.   Respiratory: Negative.   Cardiovascular: Negative.   Gastrointestinal: Negative.   Endocrine: Negative.   Genitourinary: Negative.   Musculoskeletal: Negative.   Skin: Negative.   Allergic/Immunologic: Negative.   Neurological: Negative.   Hematological: Negative.   Psychiatric/Behavioral: Negative.        Objective:   Physical Exam  Constitutional: He is oriented to person, place, and time. He appears well-developed and well-nourished. No distress.  Pleasant and alert in no complaints except erectile dysfunction  HENT:  Head: Normocephalic and atraumatic.  Right Ear: External ear normal.  Nose: Nose normal.  Mouth/Throat: Oropharynx is clear and moist. No oropharyngeal exudate.  Ears cerumen left ear canal  Eyes: Conjunctivae and EOM are normal. Pupils are equal, round, and reactive to light. Right eye exhibits no discharge. Left eye exhibits no discharge. No scleral icterus.  Up-to-date on eye exam  Neck: Normal range of motion. Neck supple. No thyromegaly present.  No bruits thyromegaly or anterior cervical adenopathy  Cardiovascular: Normal rate, regular rhythm and intact distal pulses.   No murmur heard. Heart has a regular rate and rhythm at 72/m  Pulmonary/Chest: Effort normal  and breath sounds normal. No respiratory distress. He has no wheezes. He has no rales. He exhibits no tenderness.  Clear anteriorly and posteriorly and no axillary adenopathy  Abdominal: Soft. Bowel sounds are normal. He exhibits no mass. There is no tenderness. There is no rebound and no guarding.  Abdominal obesity without aura enlargement masses or tenderness  Genitourinary:  Genitourinary  Comments: The patient sees the urologist/oncologist regularly about every 6 months he says.  Musculoskeletal: Normal range of motion. He exhibits no edema.  Lymphadenopathy:    He has no cervical adenopathy.  Neurological: He is alert and oriented to person, place, and time. He has normal reflexes. No cranial nerve deficit.  Skin: Skin is warm and dry. No rash noted.  Psychiatric: He has a normal mood and affect. His behavior is normal. Judgment and thought content normal.  Nursing note and vitals reviewed.  BP 124/72 (BP Location: Left Arm)   Pulse 77   Temp 97.3 F (36.3 C) (Oral)   Ht _0  (1.727 m)   Wt 230 lb (104.3 kg)   BMI 34.97 kg/m         Assessment & Plan:  1. Type 2 diabetes mellitus without complication, without long-term current use of insulin ([place]) -Continue with exercise regimen and current treatment pending results of lab work - Bayer DCA Hb A1c Waived - BMP8+EGFR - CBC with Differential/Platelet  2. Hyperlipidemia associated with type 2 diabetes mellitus ([place]) -Continue with aggressive therapeutic lifestyle changes and atorvastatin pending results of lab work - CBC with Differential/Platelet - Lipid panel  3. Essential hypertension -The blood pressure is good today and he will continue with current treatment - BMP8+EGFR - CBC with Differential/Platelet - Hepatic function panel  4. Vitamin D deficiency -[place] with current treatment pending results of lab work - CBC with Differential/Platelet - VITAMIN D 25 Hydroxy (Vit-D Deficiency, Fractures)  5. Prostate cancer [facility]) -Follow-up with urology as planned - CBC with Differential/Platelet  6. Benign prostatic hyperplasia, unspecified whether lower urinary tract symptoms present -Patient does have prostate cancer and has had seed implants and follows up regularly with his urology radiation therapist. - CBC with Differential/Platelet  7. Peripheral vascular insufficiency ([facility]) -No complaints today  with claudication. - CBC with Differential/Platelet  8. Type 2 diabetes mellitus with insulin therapy ([place]) -The patient brings in blood sugars for review and overall these are good throughout the day. - CBC with Differential/Platelet  9. Erectile dysfunction due to arterial insufficiency -We will give the patient some samples of Viagra 100 mg to try and he will see if this will help his erectile dysfunction. If it does he is going to call us back and we'll call in prescription for the generic Viagra to the drugstore.  Patient Instructions                       Medicare Annual Wellness Visit  [facility] and the medical providers at [facility] strive to bring you the best medical care.  In doing so we not only want to address your current medical conditions and concerns but also to detect new conditions early and prevent illness, disease and health-related problems.    Medicare offers a yearly Wellness Visit which allows our clinical staff to assess your need for preventative services including immunizations, lifestyle education, counseling to decrease risk of preventable diseases and screening for fall risk and other medical concerns.    This visit is provided free of  charge (no copay) for all Medicare recipients. The clinical pharmacists at [facility] have begun to conduct these Wellness Visits which will also include a thorough review of all your medications.    As you primary medical provider recommend that you make an appointment for your Annual Wellness Visit if you have not done so already this year.  You may set up this appointment before you leave today or you may call back ([phone]) and schedule an appointment.  Please make sure when you call that you mention that you are scheduling your Annual Wellness Visit with the clinical pharmacist so that the appointment may be made for the proper length of time.  m    Continue current  medications. Continue good therapeutic lifestyle changes which include good diet and exercise. Fall precautions discussed with patient. If an FOBT was given today- please return it to our front desk. If you are over 78 years old - you may need Prevnar 76 or the adult Pneumonia vaccine.  **Flu shots are available--- please call and schedule a FLU-CLINIC appointment**  After your visit with [place] today you will receive a survey in the mail or online from [employer] regarding your care with [place]. Please take a moment to fill this out. Your feedback is very important to [place] as you can help [place] better understand your patient needs as well as improve your experience and satisfaction. WE CARE ABOUT YOU!!!   Continue to follow-up aggressive therapeutic lifestyle changes Drink plenty of fluids this winter and keep the house as cool as possible   Arrie Senate MD

## 2016-06-14 LAB — BMP8+EGFR
BUN / CREAT RATIO: 17 (ref 10–24)
BUN: 17 mg/dL (ref 8–27)
CO2: 22 mmol/L (ref 18–29)
CREATININE: 1.03 mg/dL (ref 0.76–1.27)
Calcium: 9.3 mg/dL (ref 8.6–10.2)
Chloride: 103 mmol/L (ref 96–106)
GFR calc Af Amer: 85 mL/min/{1.73_m2} (ref >59)
GFR, EST NON AFRICAN AMERICAN: 73 mL/min/{1.73_m2} (ref >59)
Glucose: 95 mg/dL (ref 65–99)
Potassium: 4.2 mmol/L (ref 3.5–5.2)
SODIUM: 142 mmol/L (ref 134–144)

## 2016-06-14 LAB — CBC WITH DIFFERENTIAL/PLATELET
BASOS: 1 %
Basophils Absolute: 0 10*3/uL (ref 0.0–0.2)
EOS (ABSOLUTE): 0.3 10*3/uL (ref 0.0–0.4)
EOS: 4 %
HEMATOCRIT: 36.9 % — AB (ref 37.5–51.0)
HEMOGLOBIN: 12.5 g/dL — AB (ref 13.0–17.7)
IMMATURE GRANULOCYTES: 0 %
Immature Grans (Abs): 0 10*3/uL (ref 0.0–0.1)
LYMPHS ABS: 3.5 10*3/uL — AB (ref 0.7–3.1)
Lymphs: 46 %
MCH: 30 pg (ref 26.6–33.0)
MCHC: 33.9 g/dL (ref 31.5–35.7)
MCV: 89 fL (ref 79–97)
MONOS ABS: 0.6 10*3/uL (ref 0.1–0.9)
Monocytes: 8 %
Neutrophils Absolute: 3.1 10*3/uL (ref 1.4–7.0)
Neutrophils: 41 %
Platelets: 234 10*3/uL (ref 150–379)
RBC: 4.17 x10E6/uL (ref 4.14–5.80)
RDW: 14.2 % (ref 12.3–15.4)
WBC: 7.5 10*3/uL (ref 3.4–10.8)

## 2016-06-14 LAB — LIPID PANEL
Chol/HDL Ratio: 3.5 ratio units (ref 0.0–5.0)
Cholesterol, Total: 110 mg/dL (ref 100–199)
HDL: 31 mg/dL — AB (ref >39)
LDL Calculated: 48 mg/dL (ref 0–99)
Triglycerides: 156 mg/dL — ABNORMAL HIGH (ref 0–149)
VLDL Cholesterol Cal: 31 mg/dL (ref 5–40)

## 2016-06-14 LAB — HEPATIC FUNCTION PANEL
ALK PHOS: 95 IU/L (ref 39–117)
ALT: 25 IU/L (ref 0–44)
AST: 30 IU/L (ref 0–40)
Albumin: 4.2 g/dL (ref 3.5–4.8)
BILIRUBIN TOTAL: 0.4 mg/dL (ref 0.0–1.2)
Bilirubin, Direct: 0.13 mg/dL (ref 0.00–0.40)
Total Protein: 7.2 g/dL (ref 6.0–8.5)

## 2016-06-14 LAB — VITAMIN D 25 HYDROXY (VIT D DEFICIENCY, FRACTURES): VIT D 25 HYDROXY: 40.4 ng/mL (ref 30.0–100.0)

## 2016-06-25 ENCOUNTER — Other Ambulatory Visit: Payer: Commercial Managed Care - HMO

## 2016-06-25 DIAGNOSIS — Z1211 Encounter for screening for malignant neoplasm of colon: Secondary | ICD-10-CM | POA: Diagnosis not present

## 2016-06-26 LAB — FECAL OCCULT BLOOD, IMMUNOCHEMICAL: Fecal Occult Bld: NEGATIVE

## 2016-07-04 DIAGNOSIS — E119 Type 2 diabetes mellitus without complications: Secondary | ICD-10-CM | POA: Diagnosis not present

## 2016-07-04 DIAGNOSIS — Z7984 Long term (current) use of oral hypoglycemic drugs: Secondary | ICD-10-CM | POA: Diagnosis not present

## 2016-07-04 DIAGNOSIS — H40013 Open angle with borderline findings, low risk, bilateral: Secondary | ICD-10-CM | POA: Diagnosis not present

## 2016-07-04 DIAGNOSIS — H25813 Combined forms of age-related cataract, bilateral: Secondary | ICD-10-CM | POA: Diagnosis not present

## 2016-07-04 LAB — HM DIABETES EYE EXAM

## 2016-07-10 ENCOUNTER — Encounter: Payer: Self-pay | Admitting: Internal Medicine

## 2016-07-16 ENCOUNTER — Other Ambulatory Visit: Payer: Self-pay | Admitting: Family Medicine

## 2016-07-21 ENCOUNTER — Encounter: Payer: Self-pay | Admitting: Internal Medicine

## 2016-07-25 ENCOUNTER — Telehealth: Payer: Self-pay | Admitting: Family Medicine

## 2016-07-25 NOTE — Telephone Encounter (Signed)
Left message no samples available  

## 2016-08-01 ENCOUNTER — Telehealth: Payer: Self-pay | Admitting: Family Medicine

## 2016-08-01 NOTE — Telephone Encounter (Signed)
Pt notified of samples Samples to front for pt pick up 

## 2016-08-08 DIAGNOSIS — N359 Urethral stricture, unspecified: Secondary | ICD-10-CM | POA: Diagnosis not present

## 2016-08-08 DIAGNOSIS — R3915 Urgency of urination: Secondary | ICD-10-CM | POA: Diagnosis not present

## 2016-08-08 DIAGNOSIS — C61 Malignant neoplasm of prostate: Secondary | ICD-10-CM | POA: Diagnosis not present

## 2016-08-18 ENCOUNTER — Telehealth: Payer: Self-pay | Admitting: Family Medicine

## 2016-08-18 MED ORDER — SITAGLIPTIN PHOSPHATE 100 MG PO TABS: 100.0000 mg | ORAL_TABLET | Freq: Every day | ORAL | 0 refills | Status: DC

## 2016-08-18 NOTE — Telephone Encounter (Signed)
LMOVM samples are at the front desk ready for pickup

## 2016-08-28 ENCOUNTER — Ambulatory Visit (AMBULATORY_SURGERY_CENTER): Payer: Self-pay | Admitting: *Deleted

## 2016-08-28 VITALS — Ht 68.0 in | Wt 232.8 lb

## 2016-08-28 DIAGNOSIS — Z86010 Personal history of colonic polyps: Secondary | ICD-10-CM

## 2016-08-28 MED ORDER — NA SULFATE-K SULFATE-MG SULF 17.5-3.13-1.6 GM/177ML PO SOLN: 1.0000 | Freq: Once | ORAL | 0 refills | Status: AC

## 2016-08-28 NOTE — Progress Notes (Signed)
Denies allergies to eggs or soy products. Denies complications with sedation or anesthesia. Denies O2 use. Denies use of diet or weight loss medications.  Emmi instructions declined for colonoscopy.  

## 2016-08-29 ENCOUNTER — Telehealth: Payer: Self-pay | Admitting: Internal Medicine

## 2016-08-29 ENCOUNTER — Encounter: Payer: Self-pay | Admitting: Internal Medicine

## 2016-08-29 NOTE — Telephone Encounter (Signed)
Suprep was placed at front desk for patient to pick up. Patient verbalizes understanding.

## 2016-09-11 ENCOUNTER — Encounter: Payer: Self-pay | Admitting: Internal Medicine

## 2016-09-11 ENCOUNTER — Encounter: Payer: Medicare PPO | Admitting: Internal Medicine

## 2016-09-11 ENCOUNTER — Ambulatory Visit (AMBULATORY_SURGERY_CENTER): Payer: Medicare HMO | Admitting: Internal Medicine

## 2016-09-11 VITALS — BP 121/63 | HR 58 | Temp 95.3°F | Resp 18 | Ht 68.0 in | Wt 232.0 lb

## 2016-09-11 DIAGNOSIS — K621 Rectal polyp: Secondary | ICD-10-CM

## 2016-09-11 DIAGNOSIS — D124 Benign neoplasm of descending colon: Secondary | ICD-10-CM | POA: Diagnosis not present

## 2016-09-11 DIAGNOSIS — Z86010 Personal history of colonic polyps: Secondary | ICD-10-CM

## 2016-09-11 DIAGNOSIS — D123 Benign neoplasm of transverse colon: Secondary | ICD-10-CM | POA: Diagnosis not present

## 2016-09-11 DIAGNOSIS — E119 Type 2 diabetes mellitus without complications: Secondary | ICD-10-CM | POA: Diagnosis not present

## 2016-09-11 DIAGNOSIS — I10 Essential (primary) hypertension: Secondary | ICD-10-CM | POA: Diagnosis not present

## 2016-09-11 DIAGNOSIS — K635 Polyp of colon: Secondary | ICD-10-CM | POA: Diagnosis not present

## 2016-09-11 MED ORDER — SODIUM CHLORIDE 0.9 % IV SOLN: 500.0000 mL | INTRAVENOUS | Status: DC

## 2016-09-11 NOTE — Patient Instructions (Signed)
NO IBUPROFEN,NAPROXEN, OR ANY OTHER  ANTIINFLAMMATORIES FOR 3 WEEKS, October 01, 2016.  HANDOUTS GIVEN: POLYPS, DIVERTICULOSIS AND HEMORRHOIDS.   YOU HAD AN ENDOSCOPIC PROCEDURE TODAY AT [facility]:   Refer to the procedure report that was given to you for any specific questions about what was found during the examination.  If the procedure report does not answer your questions, please call your gastroenterologist to clarify.  If you requested that your care partner not be given the details of your procedure findings, then the procedure report has been included in a sealed envelope for you to review at your convenience later.  YOU SHOULD EXPECT: Some feelings of bloating in the abdomen. Passage of more gas than usual.  Walking can help get rid of the air that was put into your GI tract during the procedure and reduce the bloating. If you had a lower endoscopy (such as a colonoscopy or flexible sigmoidoscopy) you may notice spotting of blood in your stool or on the toilet paper. If you underwent a bowel prep for your procedure, you may not have a normal bowel movement for a few days.  Please Note:  You might notice some irritation and congestion in your nose or some drainage.  This is from the oxygen used during your procedure.  There is no need for concern and it should clear up in a day or so.  SYMPTOMS TO REPORT IMMEDIATELY:   Following lower endoscopy (colonoscopy or flexible sigmoidoscopy):  Excessive amounts of blood in the stool  Significant tenderness or worsening of abdominal pains  Swelling of the abdomen that is new, acute  Fever of 100F or higher   For urgent or emergent issues, a gastroenterologist can be reached at any hour by calling [phone].   DIET:  We do recommend a small meal at first, but then you may proceed to your regular diet.  Drink plenty of fluids but you should avoid alcoholic beverages for 24 hours.  ACTIVITY:  You should plan to take it  easy for the rest of today and you should NOT DRIVE or use heavy machinery until tomorrow (because of the sedation medicines used during the test).    FOLLOW UP: Our staff will call the number listed on your records the next business day following your procedure to check on you and address any questions or concerns that you may have regarding the information given to you following your procedure. If we do not reach you, we will leave a message.  However, if you are feeling well and you are not experiencing any problems, there is no need to return our call.  We will assume that you have returned to your regular daily activities without incident.  If any biopsies were taken you will be contacted by phone or by letter within the next 1-3 weeks.  Please call us at [phone] if you have not heard about the biopsies in 3 weeks.    SIGNATURES/CONFIDENTIALITY: You and/or your care partner have signed paperwork which will be entered into your electronic medical record.  These signatures attest to the fact that that the information above on your After Visit Summary has been reviewed and is understood.  Full responsibility of the confidentiality of this discharge information lies with you and/or your care-partner.

## 2016-09-11 NOTE — Progress Notes (Signed)
Report to PACU, RN, vss, BBS= Clear.  

## 2016-09-11 NOTE — Op Note (Signed)
[facility] Patient Name: Andrew [name] Procedure Date: 09/11/2016 11:19 AM MRN: [id number] Endoscopist: Jerene Bears , MD Age: 71 Referring MD:  Date of Birth: 1946/02/26 Gender: Male Account #: [id number] Procedure:                Colonoscopy Indications:              Surveillance: Personal history of adenomatous                            polyps on last colonoscopy 3 years ago Medicines:                Monitored Anesthesia Care Procedure:                Pre-Anesthesia Assessment:                           - Prior to the procedure, a History and Physical                            was performed, and patient medications and                            allergies were reviewed. The patient's tolerance of                            previous anesthesia was also reviewed. The risks                            and benefits of the procedure and the sedation                            options and risks were discussed with the patient.                            All questions were answered, and informed consent                            was obtained. Prior Anticoagulants: The patient has                            taken no previous anticoagulant or antiplatelet                            agents. ASA Grade Assessment: II - A patient with                            mild systemic disease. After reviewing the risks                            and benefits, the patient was deemed in                            satisfactory condition to undergo the procedure.  After obtaining informed consent, the colonoscope                            was passed under direct vision. Throughout the                            procedure, the patient's blood pressure, pulse, and                            oxygen saturations were monitored continuously. The                            Model CF-HQ190L [id number]) scope was introduced                            through the anus and advanced  to the the cecum,                            identified by appendiceal orifice and ileocecal                            valve. The colonoscopy was performed without                            difficulty. The patient tolerated the procedure                            well. The quality of the bowel preparation was                            good. The ileocecal valve, appendiceal orifice, and                            rectum were photographed. Scope In: 11:26:40 AM Scope Out: 11:51:51 AM Scope Withdrawal Time: 0 hours 20 minutes 33 seconds  Total Procedure Duration: 0 hours 25 minutes 11 seconds  Findings:                 The digital rectal exam was normal.                           Four sessile polyps were found in the transverse                            colon. The polyps were 4 to 6 mm in size. These                            polyps were removed with a cold snare. Resection                            and retrieval were complete.                           A 6 mm polyp was found in the descending colon.  The                            polyp was sessile. The polyp was removed with a                            cold snare. Resection and retrieval were complete.                           A 14 mm polyp was found in the distal descending                            colon. The polyp was sessile almost flat. The polyp                            was removed with a hot snare. Resection and                            retrieval were complete.                           Multiple small and large-mouthed diverticula were                            found in the sigmoid colon, descending colon and                            ascending colon.                           A localized area of granular mucosa was found in                            the distal rectum just proximal to the dentate line                            (seen previously and benign/non-adenomatous by                            biopsy). This  was biopsied with a cold forceps for                            histology.                           Internal hemorrhoids were found during                            retroflexion. The hemorrhoids were medium-sized. Complications:            No immediate complications. Estimated Blood Loss:     Estimated blood loss was minimal. Impression:               - Four 4 to 6 mm polyps in the transverse colon,  removed with a cold snare. Resected and retrieved.                           - One 6 mm polyp in the descending colon, removed                            with a cold snare. Resected and retrieved.                           - One 14 mm polyp in the distal descending colon,                            removed with a hot snare. Resected and retrieved.                           - Moderate diverticulosis in the sigmoid colon, in                            the descending colon and in the ascending colon.                           - Granularity in the distal rectum. Biopsied.                           - Internal hemorrhoids. Recommendation:           - Patient has a contact number available for                            emergencies. The signs and symptoms of potential                            delayed complications were discussed with the                            patient. Return to normal activities tomorrow.                            Written discharge instructions were provided to the                            patient.                           - Resume previous diet.                           - Continue present medications.                           - Await pathology results.                           - No ibuprofen, naproxen, or other non-steroidal  anti-inflammatory drugs for 3 weeks after polyp                            removal.                           - Await pathology results.                           - Repeat colonoscopy is  recommended for                            surveillance. The colonoscopy date will be                            determined after pathology results from today's                            exam become available for review. Jerene Bears, MD 09/11/2016 11:59:23 AM This report has been signed electronically.

## 2016-09-11 NOTE — Progress Notes (Signed)
Called to room to assist during endoscopic procedure.  Patient ID and intended procedure confirmed with present staff. Received instructions for my participation in the procedure from the performing physician.  

## 2016-09-11 NOTE — Progress Notes (Signed)
Pt's states no medical or surgical changes since previsit or office visit. 

## 2016-09-12 ENCOUNTER — Telehealth: Payer: Self-pay | Admitting: *Deleted

## 2016-09-12 NOTE — Telephone Encounter (Signed)
  Follow up Call-  Call back number 09/11/2016  Post procedure Call Back phone  # [phone]  Permission to leave phone message Yes  Some recent data might be hidden     Patient questions:  Do you have a fever, pain , or abdominal swelling? No. Pain Score  0 *  Have you tolerated food without any problems? Yes.    Have you been able to return to your normal activities? Yes.    Do you have any questions about your discharge instructions: Diet   No. Medications  No. Follow up visit  No.  Do you have questions or concerns about your Care? No.  Actions: * If pain score is 4 or above: No action needed, pain <4.

## 2016-09-18 ENCOUNTER — Encounter: Payer: Self-pay | Admitting: Internal Medicine

## 2016-09-19 NOTE — Progress Notes (Signed)
Please make a note in this patient's record that he has had precancerous polyps and will need another colonoscopy in 3 years.

## 2016-10-03 ENCOUNTER — Telehealth: Payer: Self-pay | Admitting: Family Medicine

## 2016-10-03 NOTE — Telephone Encounter (Signed)
Pt notified no samples available 

## 2016-10-10 ENCOUNTER — Telehealth: Payer: Self-pay | Admitting: Family Medicine

## 2016-10-13 ENCOUNTER — Telehealth: Payer: Self-pay | Admitting: Family Medicine

## 2016-10-13 NOTE — Telephone Encounter (Signed)
Samples placed at front desk.  

## 2016-10-30 ENCOUNTER — Ambulatory Visit (INDEPENDENT_AMBULATORY_CARE_PROVIDER_SITE_OTHER): Payer: Medicare HMO | Admitting: Family Medicine

## 2016-10-30 ENCOUNTER — Encounter: Payer: Self-pay | Admitting: Family Medicine

## 2016-10-30 ENCOUNTER — Ambulatory Visit (INDEPENDENT_AMBULATORY_CARE_PROVIDER_SITE_OTHER): Payer: Medicare HMO

## 2016-10-30 ENCOUNTER — Ambulatory Visit: Payer: Commercial Managed Care - HMO | Admitting: Family Medicine

## 2016-10-30 VITALS — BP 109/54 | HR 69 | Temp 96.7°F | Ht 68.0 in | Wt 230.0 lb

## 2016-10-30 DIAGNOSIS — N5201 Erectile dysfunction due to arterial insufficiency: Secondary | ICD-10-CM

## 2016-10-30 DIAGNOSIS — K635 Polyp of colon: Secondary | ICD-10-CM

## 2016-10-30 DIAGNOSIS — N400 Enlarged prostate without lower urinary tract symptoms: Secondary | ICD-10-CM | POA: Diagnosis not present

## 2016-10-30 DIAGNOSIS — E611 Iron deficiency: Secondary | ICD-10-CM

## 2016-10-30 DIAGNOSIS — C61 Malignant neoplasm of prostate: Secondary | ICD-10-CM | POA: Diagnosis not present

## 2016-10-30 DIAGNOSIS — E119 Type 2 diabetes mellitus without complications: Secondary | ICD-10-CM | POA: Diagnosis not present

## 2016-10-30 DIAGNOSIS — E559 Vitamin D deficiency, unspecified: Secondary | ICD-10-CM | POA: Diagnosis not present

## 2016-10-30 DIAGNOSIS — I10 Essential (primary) hypertension: Secondary | ICD-10-CM | POA: Diagnosis not present

## 2016-10-30 DIAGNOSIS — I739 Peripheral vascular disease, unspecified: Secondary | ICD-10-CM | POA: Diagnosis not present

## 2016-10-30 DIAGNOSIS — E1169 Type 2 diabetes mellitus with other specified complication: Secondary | ICD-10-CM

## 2016-10-30 DIAGNOSIS — E785 Hyperlipidemia, unspecified: Secondary | ICD-10-CM

## 2016-10-30 LAB — BAYER DCA HB A1C WAIVED: HB A1C (BAYER DCA - WAIVED): 7.1 % — ABNORMAL HIGH (ref <7.0)

## 2016-10-30 NOTE — Progress Notes (Signed)
Subjective:    Patient ID: Andrew [name], male    DOB: August 01, 1945, 71 y.o.   MRN: [id number]  HPI Pt here for follow up and management of chronic medical problems which includes diabetes, hyperlipidemia and hypertension. He is taking medication regularly.The patient is doing well overall today he has no complaints today he is not requesting any refills. He does bring in blood sugar readings from the outside. These are generally running in the 120-140 range fasting and no higher than 140s during the day. This record will be scanned into the computer. The patient denies any chest pain tightness or pressure. He does get a little shortness of breath with activity but there is no associated chest pain. He denies any problems with his stomach including nausea vomiting diarrhea blood in the stool or black tarry bowel movements. He sees the urologist regularly and has an appointment with him in July because of his prostate cancer and seed implants. He is up to date on his eye exams. He also had a recent colonoscopy with colon polyps removed. The patient does have risk factors for heart disease. He is not having any symptoms. We may go ahead and arrange a one-time visit with the cardiologist for further evaluation to make sure that everything is stable for this patient cardiovascular wise. The patient also indicates the samples of Viagra that he was given did not help his father is with his erectile dysfunction.   Patient Active Problem List   Diagnosis Date Noted  . Peripheral vascular insufficiency ([place]) 06/13/2016  . Vitamin D deficiency 08/24/2015  . Prostate cancer ([place]) 04/07/2014  . Diabetes mellitus type 2, uncontrolled ([place]) 09/14/2010  . BPH (benign prostatic hyperplasia) 09/14/2010  . HTN (hypertension) 09/14/2010  . Hyperlipidemia associated with type 2 diabetes mellitus ([place]) 09/14/2010  . Tobacco abuse 09/14/2010   Outpatient Encounter Prescriptions as of 10/30/2016  Medication Sig  .  ACCU-CHEK AVIVA PLUS test strip CHECK BLOOD SUGAR  UP  TO THREE TIMES DAILY  . aspirin 81 MG tablet Take 81 mg by mouth daily.  Marland Kitchen atorvastatin (LIPITOR) 40 MG tablet TAKE 1 TABLET EVERY DAY  . cholecalciferol (VITAMIN D) 1000 UNITS tablet Take 2,000 Units by mouth daily.   . Cinnamon 500 MG capsule Take 500 mg by mouth daily as needed (blood glucose levels).  Marland Kitchen lisinopril-hydrochlorothiazide (PRINZIDE,ZESTORETIC) 20-25 MG tablet TAKE 1 TABLET EVERY DAY  . metFORMIN (GLUCOPHAGE) 1000 MG tablet TAKE 1 TABLET TWICE DAILY WITH MEALS  . naproxen (NAPROSYN) 250 MG tablet Take 1 po BID with food prn pain  . sitaGLIPtin (JANUVIA) 100 MG tablet Take 1 tablet (100 mg total) by mouth daily.  . tamsulosin (FLOMAX) 0.4 MG CAPS capsule TAKE 1 CAPSULE EVERY DAY   Facility-Administered Encounter Medications as of 10/30/2016  Medication  . 0.9 %  sodium chloride infusion       Review of Systems  Constitutional: Negative.   HENT: Negative.   Eyes: Negative.   Respiratory: Negative.   Cardiovascular: Negative.   Gastrointestinal: Negative.   Endocrine: Negative.   Genitourinary: Negative.   Musculoskeletal: Negative.   Skin: Negative.   Allergic/Immunologic: Negative.   Neurological: Negative.   Hematological: Negative.   Psychiatric/Behavioral: Negative.        Objective:   Physical Exam  Constitutional: He is oriented to person, place, and time. He appears well-developed and well-nourished. No distress.  Pleasant and calm and relaxed  HENT:  Head: Normocephalic and atraumatic.  Right Ear: External ear normal.  Left Ear: External ear normal.  Nose: Nose normal.  Mouth/Throat: Oropharynx is clear and moist. No oropharyngeal exudate.  Eyes: Conjunctivae and EOM are normal. Pupils are equal, round, and reactive to light. Right eye exhibits no discharge. Left eye exhibits no discharge. No scleral icterus.  Neck: Normal range of motion. Neck supple. No thyromegaly present.  Cardiovascular:  Normal rate, regular rhythm, normal heart sounds and intact distal pulses.   No murmur heard. The heart is regular at 72/m  Pulmonary/Chest: Effort normal and breath sounds normal. No respiratory distress. He has no wheezes. He has no rales. He exhibits no tenderness.  Lungs are clear anteriorly and posteriorly and no axillary adenopathy  Abdominal: Soft. Bowel sounds are normal. He exhibits no mass. There is no tenderness. There is no rebound and no guarding.  Abdominal obesity without organ enlargement or bruits tenderness  Genitourinary:  Genitourinary Comments: The patient will follow-up with his urologist this summer.  Musculoskeletal: Normal range of motion. He exhibits no edema or tenderness.  Lymphadenopathy:    He has no cervical adenopathy.  Neurological: He is alert and oriented to person, place, and time. He has normal reflexes. No cranial nerve deficit.  Skin: Skin is warm and dry. No rash noted.  Psychiatric: He has a normal mood and affect. His behavior is normal. Judgment and thought content normal.  Nursing note and vitals reviewed.  BP (!) 109/54 (BP Location: Left Arm)   Pulse 69   Temp (!) 96.7 F (35.9 C) (Oral)   Ht '5\' 8"'  (1.727 m)   Wt 230 lb (104.3 kg)   BMI 34.97 kg/m         Assessment & Plan:  1. Essential hypertension -The blood pressure is good today and he will continue with current treatment - CBC with Differential/Platelet - BMP8+EGFR - Hepatic function panel - DG Chest 2 View; Future  2. Hyperlipidemia associated with type 2 diabetes mellitus ([place]) -Continue with atorvastatin and aggressive therapeutic lifestyle changes pending results of lab work - CBC with Differential/Platelet - Lipid panel - DG Chest 2 View; Future  3. Type 2 diabetes mellitus without complication, without long-term current use of insulin ([place]) -Continue with current treatment pending results of lab work along with aggressive therapeutic lifestyle changes - CBC with  Differential/Platelet - Microalbumin / creatinine urine ratio - Bayer DCA Hb A1c Waived  4. Vitamin D deficiency -Continue with current treatment pending results of lab work - CBC with Differential/Platelet - VITAMIN D 25 Hydroxy (Vit-D Deficiency, Fractures)  5. Benign prostatic hyperplasia, unspecified whether lower urinary tract symptoms present -The patient does have a history of prostate cancer with seed implant. He will continue to follow-up with the urologist. - CBC with Differential/Platelet  6. Prostate cancer [facility]) -Follow-up with urology as planned - CBC with Differential/Platelet  7. Peripheral vascular insufficiency ([facility]) -Schedule visit with cardiology for routine evaluation of patient with multiple risk factors for heart disease  8. Polyp of colon, unspecified part of colon, unspecified type -Follow-up with gastroenterology as planned because of recent removal of polyps  9. Erectile dysfunction due to arterial insufficiency -Trial of sample medicine to see if it helps his erectile dysfunction -Discuss at the next visit with the urologist also.  Patient Instructions                       Medicare Annual Wellness Visit  [facility] and the medical providers at [facility] strive to bring you the  best medical care.  In doing so we not only want to address your current medical conditions and concerns but also to detect new conditions early and prevent illness, disease and health-related problems.    Medicare offers a yearly Wellness Visit which allows our clinical staff to assess your need for preventative services including immunizations, lifestyle education, counseling to decrease risk of preventable diseases and screening for fall risk and other medical concerns.    This visit is provided free of charge (no copay) for all Medicare recipients. The clinical pharmacists at [facility] have begun to conduct these Wellness  Visits which will also include a thorough review of all your medications.    As you primary medical provider recommend that you make an appointment for your Annual Wellness Visit if you have not done so already this year.  You may set up this appointment before you leave today or you may call back ([phone]) and schedule an appointment.  Please make sure when you call that you mention that you are scheduling your Annual Wellness Visit with the clinical pharmacist so that the appointment may be made for the proper length of time.     Continue current medications. Continue good therapeutic lifestyle changes which include good diet and exercise. Fall precautions discussed with patient. If an FOBT was given today- please return it to our front desk. If you are over 1 years old - you may need Prevnar 75 or the adult Pneumonia vaccine.  **Flu shots are available--- please call and schedule a FLU-CLINIC appointment**  After your visit with [place] today you will receive a survey in the mail or online from [employer] regarding your care with [place]. Please take a moment to fill this out. Your feedback is very important to [place] as you can help [place] better understand your patient needs as well as improve your experience and satisfaction. WE CARE ABOUT YOU!!!     Arrie Senate MD

## 2016-10-30 NOTE — Addendum Note (Signed)
Addended by: Zannie Cove on: 10/30/2016 11:23 AM   Modules accepted: Orders

## 2016-10-30 NOTE — Patient Instructions (Signed)
Medicare Annual Wellness Visit  [facility] and the medical providers at [facility] strive to bring you the best medical care.  In doing so we not only want to address your current medical conditions and concerns but also to detect new conditions early and prevent illness, disease and health-related problems.    Medicare offers a yearly Wellness Visit which allows our clinical staff to assess your need for preventative services including immunizations, lifestyle education, counseling to decrease risk of preventable diseases and screening for fall risk and other medical concerns.    This visit is provided free of charge (no copay) for all Medicare recipients. The clinical pharmacists at [facility] have begun to conduct these Wellness Visits which will also include a thorough review of all your medications.    As you primary medical provider recommend that you make an appointment for your Annual Wellness Visit if you have not done so already this year.  You may set up this appointment before you leave today or you may call back ([phone]) and schedule an appointment.  Please make sure when you call that you mention that you are scheduling your Annual Wellness Visit with the clinical pharmacist so that the appointment may be made for the proper length of time.     Continue current medications. Continue good therapeutic lifestyle changes which include good diet and exercise. Fall precautions discussed with patient. If an FOBT was given today- please return it to our front desk. If you are over 50 years old - you may need Prevnar 13 or the adult Pneumonia vaccine.  **Flu shots are available--- please call and schedule a FLU-CLINIC appointment**  After your visit with us today you will receive a survey in the mail or online from [employer] regarding your care with us. Please take a moment to fill this out. Your feedback is very  important to us as you can help us better understand your patient needs as well as improve your experience and satisfaction. WE CARE ABOUT YOU!!!    

## 2016-10-31 LAB — HEPATIC FUNCTION PANEL
ALBUMIN: 4.3 g/dL (ref 3.5–4.8)
ALT: 33 IU/L (ref 0–44)
AST: 40 IU/L (ref 0–40)
Alkaline Phosphatase: 99 IU/L (ref 39–117)
Bilirubin Total: 0.4 mg/dL (ref 0.0–1.2)
Bilirubin, Direct: 0.11 mg/dL (ref 0.00–0.40)
Total Protein: 7.3 g/dL (ref 6.0–8.5)

## 2016-10-31 LAB — LIPID PANEL
CHOL/HDL RATIO: 4.3 ratio (ref 0.0–5.0)
Cholesterol, Total: 130 mg/dL (ref 100–199)
HDL: 30 mg/dL — ABNORMAL LOW (ref >39)
LDL Calculated: 74 mg/dL (ref 0–99)
TRIGLYCERIDES: 132 mg/dL (ref 0–149)
VLDL Cholesterol Cal: 26 mg/dL (ref 5–40)

## 2016-10-31 LAB — BMP8+EGFR
BUN/Creatinine Ratio: 16 (ref 10–24)
BUN: 17 mg/dL (ref 8–27)
CO2: 26 mmol/L (ref 18–29)
Calcium: 9.3 mg/dL (ref 8.6–10.2)
Chloride: 97 mmol/L (ref 96–106)
Creatinine, Ser: 1.09 mg/dL (ref 0.76–1.27)
GFR calc Af Amer: 79 mL/min/{1.73_m2} (ref >59)
GFR, EST NON AFRICAN AMERICAN: 68 mL/min/{1.73_m2} (ref >59)
Glucose: 137 mg/dL — ABNORMAL HIGH (ref 65–99)
POTASSIUM: 4.1 mmol/L (ref 3.5–5.2)
SODIUM: 139 mmol/L (ref 134–144)

## 2016-10-31 LAB — CBC WITH DIFFERENTIAL/PLATELET
Basophils Absolute: 0 10*3/uL (ref 0.0–0.2)
Basos: 0 %
EOS (ABSOLUTE): 0.2 10*3/uL (ref 0.0–0.4)
Eos: 3 %
HEMATOCRIT: 35.4 % — AB (ref 37.5–51.0)
Hemoglobin: 12 g/dL — ABNORMAL LOW (ref 13.0–17.7)
Immature Grans (Abs): 0 10*3/uL (ref 0.0–0.1)
Immature Granulocytes: 0 %
Lymphocytes Absolute: 2.5 10*3/uL (ref 0.7–3.1)
Lymphs: 40 %
MCH: 30 pg (ref 26.6–33.0)
MCHC: 33.9 g/dL (ref 31.5–35.7)
MCV: 89 fL (ref 79–97)
MONOS ABS: 0.5 10*3/uL (ref 0.1–0.9)
Monocytes: 8 %
NEUTROS PCT: 49 %
Neutrophils Absolute: 3 10*3/uL (ref 1.4–7.0)
PLATELETS: 250 10*3/uL (ref 150–379)
RBC: 4 x10E6/uL — ABNORMAL LOW (ref 4.14–5.80)
RDW: 14 % (ref 12.3–15.4)
WBC: 6.1 10*3/uL (ref 3.4–10.8)

## 2016-10-31 LAB — MICROALBUMIN / CREATININE URINE RATIO
CREATININE, UR: 163.3 mg/dL
MICROALBUM., U, RANDOM: 4.7 ug/mL
Microalb/Creat Ratio: 2.9 mg/g creat (ref 0.0–30.0)

## 2016-10-31 LAB — VITAMIN D 25 HYDROXY (VIT D DEFICIENCY, FRACTURES): Vit D, 25-Hydroxy: 39.8 ng/mL (ref 30.0–100.0)

## 2016-11-02 LAB — IRON AND TIBC
Iron Saturation: 19 % (ref 15–55)
Iron: 61 ug/dL (ref 38–169)
Total Iron Binding Capacity: 324 ug/dL (ref 250–450)
UIBC: 263 ug/dL (ref 111–343)

## 2016-11-02 LAB — SPECIMEN STATUS REPORT

## 2016-11-02 LAB — FERRITIN: Ferritin: 130 ng/mL (ref 30–400)

## 2016-11-03 NOTE — Addendum Note (Signed)
Addended by: Shelbie Ammons on: 11/03/2016 02:52 PM   Modules accepted: Orders

## 2016-11-11 ENCOUNTER — Other Ambulatory Visit: Payer: Medicare HMO

## 2016-11-11 DIAGNOSIS — E611 Iron deficiency: Secondary | ICD-10-CM

## 2016-11-12 LAB — CBC WITH DIFFERENTIAL/PLATELET
BASOS ABS: 0 10*3/uL (ref 0.0–0.2)
BASOS: 1 %
EOS (ABSOLUTE): 0.2 10*3/uL (ref 0.0–0.4)
Eos: 3 %
HEMOGLOBIN: 11.5 g/dL — AB (ref 13.0–17.7)
Hematocrit: 34.4 % — ABNORMAL LOW (ref 37.5–51.0)
Immature Grans (Abs): 0 10*3/uL (ref 0.0–0.1)
Immature Granulocytes: 0 %
LYMPHS: 39 %
Lymphocytes Absolute: 2.6 10*3/uL (ref 0.7–3.1)
MCH: 29.6 pg (ref 26.6–33.0)
MCHC: 33.4 g/dL (ref 31.5–35.7)
MCV: 89 fL (ref 79–97)
MONOS ABS: 0.5 10*3/uL (ref 0.1–0.9)
Monocytes: 8 %
NEUTROS PCT: 49 %
Neutrophils Absolute: 3.3 10*3/uL (ref 1.4–7.0)
PLATELETS: 225 10*3/uL (ref 150–379)
RBC: 3.88 x10E6/uL — AB (ref 4.14–5.80)
RDW: 14.5 % (ref 12.3–15.4)
WBC: 6.7 10*3/uL (ref 3.4–10.8)

## 2016-11-12 MED ORDER — FERROUS SULFATE 325 (65 FE) MG PO TBEC: 325.0000 mg | DELAYED_RELEASE_TABLET | Freq: Every day | ORAL | 1 refills | Status: DC

## 2016-12-01 ENCOUNTER — Other Ambulatory Visit: Payer: Self-pay | Admitting: Family Medicine

## 2016-12-07 NOTE — Progress Notes (Signed)
Cardiology Office Note   Date:  12/10/2016   ID:  Brinton, Brandel 1945/07/09, MRN [id number]  PCP:  Chipper Herb, MD  Cardiologist:   Minus Breeding, MD  Referring:  Chipper Herb, MD  Chief Complaint  Patient presents with  . Shortness of Breath      History of Present Illness: Andrew [name] is a 71 y.o. male who presents for evaluation of SOB.   He has multiple cardiovascular risk factors.  He reports having had some cardiac testing years ago and I see some mention of this in 2008. I don't have any record. He gets short of breath with activities such as working in his garden. He does a little exercising occasionally at the [facility] with [employer]. However, he sounds like he is relatively sedentary. He denies any chest pressure, neck or arm discomfort. He doesn't report any palpitations, presyncope or syncope. He has no PND or orthopnea.   Past Medical History:  Diagnosis Date  . Anemia   . BPH (benign prostatic hyperplasia)    Sees Dr [name]  . Cataract   . Diabetes mellitus without complication ([place])   . Hyperlipidemia   . Hypertension   . Low serum vitamin D   . Prostate cancer ([place])    w/seed implantation and radiation    Past Surgical History:  Procedure Laterality Date  . COLONOSCOPY  2015  . PROSTATE SURGERY  2008   seed implant     Current Outpatient Prescriptions  Medication Sig Dispense Refill  . aspirin 81 MG tablet Take 81 mg by mouth daily.    Marland Kitchen atorvastatin (LIPITOR) 40 MG tablet TAKE 1 TABLET EVERY DAY 90 tablet 1  . cholecalciferol (VITAMIN D) 1000 UNITS tablet Take 1,000 Units by mouth daily.     . Cinnamon 500 MG capsule Take 500 mg by mouth daily as needed (blood glucose levels).    . ferrous sulfate 325 (65 FE) MG EC tablet Take 1 tablet (325 mg total) by mouth daily with breakfast. 90 tablet 1  . lisinopril-hydrochlorothiazide (PRINZIDE,ZESTORETIC) 20-25 MG tablet TAKE 1 TABLET EVERY DAY 90 tablet 1  . metFORMIN (GLUCOPHAGE) 1000 MG  tablet TAKE 1 TABLET TWICE DAILY WITH MEALS 180 tablet 1  . sitaGLIPtin (JANUVIA) 100 MG tablet Take 1 tablet (100 mg total) by mouth daily. 42 tablet 0  . tamsulosin (FLOMAX) 0.4 MG CAPS capsule TAKE 1 CAPSULE EVERY DAY 90 capsule 1  . ACCU-CHEK AVIVA PLUS test strip CHECK BLOOD SUGAR  UP  TO THREE TIMES DAILY 300 each 1   Current Facility-Administered Medications  Medication Dose Route Frequency Provider Last Rate Last Dose  . 0.9 %  sodium chloride infusion  500 mL Intravenous Continuous Pyrtle, Lajuan Lines, MD        Allergies:   Patient has no known allergies.    Social History:  The patient  reports that he has been smoking Cigarettes.  He started smoking about 51 years ago. He has been smoking about 1.00 pack per day. He has never used smokeless tobacco. He reports that he does not drink alcohol or use drugs.   Family History:  The patient's family history includes Cancer in his sister; Colon polyps in his brother; Deep vein thrombosis in his sister; Diabetes in his mother; Emphysema in his father; Gout in his brother; Heart disease in his mother; Hypertension in his brother; Kidney disease in his mother and sister.    ROS:  Please see the  history of present illness.   Otherwise, review of systems are positive for cramping in his abdomen and occasionally in his legs..   All other systems are reviewed and negative.    PHYSICAL EXAM: VS:  BP 128/68   Pulse 82   Ht 5\' 8"  (1.727 m)   Wt 228 lb (103.4 kg)   BMI 34.67 kg/m  , BMI Body mass index is 34.67 kg/m. GENERAL:  Well appearing HEENT:  Pupils equal round and reactive, fundi not visualized, oral mucosa unremarkable, poor dentition NECK:  No jugular venous distention, waveform within normal limits, carotid upstroke brisk and symmetric, no bruits, no thyromegaly LYMPHATICS:  No cervical, inguinal adenopathy LUNGS:  Clear to auscultation bilaterally BACK:  No CVA tenderness CHEST:  Unremarkable HEART:  PMI not displaced or  sustained,S1 and S2 within normal limits, no S3, no S4, no clicks, no rubs, no murmurs ABD:  Flat, positive bowel sounds normal in frequency in pitch, no bruits, no rebound, no guarding, no midline pulsatile mass, no hepatomegaly, no splenomegaly EXT:  2 plus pulses throughout, no edema, no cyanosis no clubbing SKIN:  No rashes no nodules NEURO:  Cranial nerves II through XII grossly intact, motor grossly intact throughout PSYCH:  Cognitively intact, oriented to person place and time    EKG:  EKG is ordered today. The ekg ordered today demonstrates sinus rhythm, rate 52, axis within normal limits, intervals within normal limits, no acute ST-T wave changes.   Recent Labs: 10/30/2016: ALT 33; BUN 17; Creatinine, Ser 1.09; Potassium 4.1; Sodium 139 11/11/2016: Hemoglobin 11.5; Platelets 225    Lipid Panel    Component Value Date/Time   CHOL 130 10/30/2016 1119   CHOL 144 11/18/2012 0844   TRIG 132 10/30/2016 1119   TRIG 142 01/03/2016 0800   TRIG 161 (H) 11/18/2012 0844   HDL 30 (L) 10/30/2016 1119   HDL 31 (L) 01/03/2016 0800   HDL 27 (L) 11/18/2012 0844   CHOLHDL 4.3 10/30/2016 1119   LDLCALC 74 10/30/2016 1119   LDLCALC 45 03/08/2014 0810   LDLCALC 85 11/18/2012 0844      Wt Readings from Last 3 Encounters:  12/10/16 228 lb (103.4 kg)  10/30/16 230 lb (104.3 kg)  09/11/16 232 lb (105.2 kg)      Other studies Reviewed: Additional studies/ records that were reviewed today include: Labs. Review of the above records demonstrates:  Please see elsewhere in the note.     ASSESSMENT AND PLAN:   HTN:  The blood pressure is at target. No change in medications is indicated. We will continue with therapeutic lifestyle changes (TLC).  HYPERLIPIDEMIA:  His LDL is as above and he will continue meds as listed.    DM:  A1c was 7.1.  We discussed the need for good DM control.   TOBACCO ABUSE:    I talked to him about this and gave him the number 1 800 QUIT NOW  SOB:  This could  be an anginal equivalent.   I will bring the patient back for a POET (Plain Old Exercise Test). This will allow me to screen for obstructive coronary disease, risk stratify and very importantly provide a prescription for exercise.  Current medicines are reviewed at length with the patient today.  The patient does not have concerns regarding medicines.  The following changes have been made:  no change  Labs/ tests ordered today include:     Orders Placed This Encounter  Procedures  . Exercise Tolerance Test  Disposition:   FU with me as needed    Signed, Minus Breeding, MD  12/10/2016 3:33 PM    [facility]

## 2016-12-08 ENCOUNTER — Other Ambulatory Visit: Payer: Medicare HMO

## 2016-12-08 DIAGNOSIS — Z1211 Encounter for screening for malignant neoplasm of colon: Secondary | ICD-10-CM | POA: Diagnosis not present

## 2016-12-09 ENCOUNTER — Telehealth: Payer: Self-pay | Admitting: Family Medicine

## 2016-12-09 NOTE — Telephone Encounter (Signed)
Patient aware we are currently out of januvia samples

## 2016-12-10 ENCOUNTER — Encounter: Payer: Self-pay | Admitting: Cardiology

## 2016-12-10 ENCOUNTER — Ambulatory Visit (INDEPENDENT_AMBULATORY_CARE_PROVIDER_SITE_OTHER): Payer: Medicare HMO | Admitting: Cardiology

## 2016-12-10 VITALS — BP 128/68 | HR 82 | Ht 68.0 in | Wt 228.0 lb

## 2016-12-10 DIAGNOSIS — Z720 Tobacco use: Secondary | ICD-10-CM

## 2016-12-10 DIAGNOSIS — R0602 Shortness of breath: Secondary | ICD-10-CM

## 2016-12-10 DIAGNOSIS — I10 Essential (primary) hypertension: Secondary | ICD-10-CM

## 2016-12-10 DIAGNOSIS — E785 Hyperlipidemia, unspecified: Secondary | ICD-10-CM

## 2016-12-10 LAB — FECAL OCCULT BLOOD, IMMUNOCHEMICAL: FECAL OCCULT BLD: NEGATIVE

## 2016-12-10 NOTE — Patient Instructions (Addendum)
  Medication Instructions:  The current medical regimen is effective;  continue present plan and medications.  Testing/Procedures: Your physician has requested that you have an exercise tolerance test.  This will be completed at [facility].  Please report to the Main Entrance at 10:30 am.  Follow the instruction sheet given.  Follow-Up: Follow up as needed after the above testing.  Thank you for choosing [facility]!!

## 2016-12-11 NOTE — Addendum Note (Signed)
Addended by: Shellia Cleverly on: 12/11/2016 02:00 PM   Modules accepted: Orders

## 2016-12-15 ENCOUNTER — Telehealth: Payer: Self-pay | Admitting: Family Medicine

## 2016-12-15 MED ORDER — SITAGLIPTIN PHOSPHATE 100 MG PO TABS: 100.0000 mg | ORAL_TABLET | Freq: Every day | ORAL | 0 refills | Status: DC

## 2016-12-15 NOTE — Telephone Encounter (Signed)
Left #28 Januvia samples for patient at front desk.

## 2016-12-15 NOTE — Telephone Encounter (Signed)
Pt notified no samples available 

## 2016-12-15 NOTE — Addendum Note (Signed)
Addended by: Cherre Robins B on: 12/15/2016 05:13 PM   Modules accepted: Orders

## 2016-12-16 ENCOUNTER — Ambulatory Visit (HOSPITAL_COMMUNITY)
Admission: RE | Admit: 2016-12-16 | Discharge: 2016-12-16 | Disposition: A | Discharge status: Home / Self Care | Payer: Medicare HMO | Source: Ambulatory Visit | Attending: Cardiology | Admitting: Cardiology

## 2016-12-16 DIAGNOSIS — R0602 Shortness of breath: Secondary | ICD-10-CM

## 2016-12-16 LAB — EXERCISE TOLERANCE TEST
CHL CUP RESTING HR STRESS: 73 {beats}/min
CSEPED: 4 min
CSEPEDS: 39 s
CSEPEW: 7 METS
CSEPPHR: 141 {beats}/min
MPHR: 149 {beats}/min
Percent HR: 94 %
RPE: 12

## 2017-01-05 ENCOUNTER — Ambulatory Visit (INDEPENDENT_AMBULATORY_CARE_PROVIDER_SITE_OTHER): Payer: Medicare HMO | Admitting: *Deleted

## 2017-01-05 VITALS — BP 109/68 | HR 80 | Ht 68.0 in | Wt 228.2 lb

## 2017-01-05 DIAGNOSIS — Z0000 Encounter for general adult medical examination without abnormal findings: Secondary | ICD-10-CM

## 2017-01-05 NOTE — Progress Notes (Addendum)
Subjective:   Andrew [name] is a 71 y.o. male who presents for Medicare Annual/Subsequent preventive examination.  Review of Systems:  Andrew [name] is a 71 year old male who is here today for a Subsequent Annual Medicare exam. He states that he worked in a [place] all of his career. He retired from [employer]. He enjoys gardening, he enjoyed playing basketball but states now he just watches. He states that he tries to exercise on MWF at the local [employer] for at least 45 minutes each time. He states his diet is semi-healthy. He is a member of [employer]. He lives in [place] with his wife. He has 2 children 1 son and 1 daughter. He has 6 grandchildren. He has 1 outside dog. Fall hazards were discussed with patient as he does have several rugs throughout his home. Patient states that he feels his health is better than it was last year at this time.     Objective:    Vitals: BP 109/68   Pulse 80   Ht 5\' 8"  (1.727 m)   Wt 228 lb 3.2 oz (103.5 kg)   BMI 34.70 kg/m   Body mass index is 34.7 kg/m.  Tobacco History  Smoking Status  . Current Every Day Smoker  . Packs/day: 1.00  . Types: Cigarettes  . Start date: 06/30/1965  Smokeless Tobacco  . Never Used     Ready to quit: No Counseling given: Not Answered Patient is a current every day smoker and is not ready to quit at this time  Past Medical History:  Diagnosis Date  . Anemia   . BPH (benign prostatic hyperplasia)    Sees Dr [name]  . Cataract   . Diabetes mellitus without complication ([place])   . Hyperlipidemia   . Hypertension   . Low serum vitamin D   . Prostate cancer ([place])    w/seed implantation and radiation   Past Surgical History:  Procedure Laterality Date  . COLONOSCOPY  2015  . PROSTATE SURGERY  2008   seed implant   Family History  Problem Relation Age of Onset  . Diabetes Mother   . Heart disease Mother   . Kidney disease Mother        DIALYSIS  . Emphysema Father   . Deep vein thrombosis Sister   .  Kidney disease Sister   . Cancer Sister        lung  . Colon polyps Brother   . Hypertension Brother   . Gout Brother   . Colon cancer Neg Hx   . Esophageal cancer Neg Hx   . Rectal cancer Neg Hx   . Prostate cancer Neg Hx    History  Sexual Activity  . Sexual activity: Yes    Outpatient Encounter Prescriptions as of 01/05/2017  Medication Sig  . ACCU-CHEK AVIVA PLUS test strip CHECK BLOOD SUGAR  UP  TO THREE TIMES DAILY  . aspirin 81 MG tablet Take 81 mg by mouth daily.  Marland Kitchen atorvastatin (LIPITOR) 40 MG tablet TAKE 1 TABLET EVERY DAY  . cholecalciferol (VITAMIN D) 1000 UNITS tablet Take 1,000 Units by mouth daily.   . Cinnamon 500 MG capsule Take 500 mg by mouth daily as needed (blood glucose levels).  . ferrous sulfate 325 (65 FE) MG EC tablet Take 1 tablet (325 mg total) by mouth daily with breakfast.  . lisinopril-hydrochlorothiazide (PRINZIDE,ZESTORETIC) 20-25 MG tablet TAKE 1 TABLET EVERY DAY  . metFORMIN (GLUCOPHAGE) 1000 MG tablet TAKE 1  TABLET TWICE DAILY WITH MEALS  . sitaGLIPtin (JANUVIA) 100 MG tablet Take 1 tablet (100 mg total) by mouth daily.  . tamsulosin (FLOMAX) 0.4 MG CAPS capsule TAKE 1 CAPSULE EVERY DAY   Facility-Administered Encounter Medications as of 01/05/2017  Medication  . 0.9 %  sodium chloride infusion    Activities of Daily Living In your present state of health, do you have any difficulty performing the following activities: 01/05/2017  Hearing? N  Vision? N  Difficulty concentrating or making decisions? N  Walking or climbing stairs? N  Dressing or bathing? N  Doing errands, shopping? N  Some recent data might be hidden  Patient does not have any concerns with is hearing or vision. Patient does have a cataract but states it has not caused any problems at this time.   Patient Care Team: Chipper Herb, MD as PCP - General (Family Medicine) Minus Breeding, MD as Consulting Physician (Cardiology) Alyson Ingles Candee Furbish, MD as Consulting Physician  (Urology)   Assessment:     Exercise Activities and Dietary recommendations    Goals    . Exercise 3x per week (30 min per time)          Continue with exercising 3 times a week for at least 30 min each time    . Have 3 meals a day      Fall Risk Fall Risk  01/05/2017 10/30/2016 06/13/2016 12/27/2015 08/24/2015  Falls in the past year? No No No No No   Depression Screen PHQ 2/9 Scores 01/05/2017 10/30/2016 06/13/2016 12/27/2015  PHQ - 2 Score 0 0 0 0  PHQ- 9 Score - - - -    Cognitive Function MMSE - Mini Mental State Exam 01/05/2017  Orientation to time 4  Orientation to Place 5  Registration 3  Attention/ Calculation 3  Recall 3  Language- name 2 objects 2  Language- repeat 1  Language- follow 3 step command 3  Language- read & follow direction 1  Write a sentence 1  Copy design 1  Total score 27   Patient scored a 27 out of 30 on MMSE      Immunization History  Administered Date(s) Administered  . Influenza, High Dose Seasonal PF 05/13/2016  . Influenza,inj,Quad PF,36+ Mos 04/18/2013, 04/07/2014, 04/06/2015  . Pneumococcal Conjugate-13 02/27/2014  . Pneumococcal Polysaccharide-23 11/16/2012   Screening Tests Health Maintenance  Topic Date Due  . TETANUS/TDAP  02/11/2017 (Originally 10/26/1964)  . Hepatitis C Screening  06/01/2017 (Originally 04/09/1946)  . INFLUENZA VACCINE  01/28/2017  . HEMOGLOBIN A1C  05/02/2017  . OPHTHALMOLOGY EXAM  07/04/2017  . FOOT EXAM  10/30/2017  . COLON CANCER SCREENING ANNUAL FOBT  12/08/2017  . COLONOSCOPY  09/12/2019  . PNA vac Low Risk Adult  Completed   Tdap price was $63.69 and patient can not afford at this time.     Plan:   Patient to follow up with Dr. Laurance Flatten and we will check the cost of Tdap again. Patient will need Dexa Scan at next appointment. Patient to follow up with Urologist on July 24th  I have personally reviewed and noted the following in the patient's chart:   . Medical and social history . Use of alcohol,  tobacco or illicit drugs  . Current medications and supplements . Functional ability and status . Nutritional status . Physical activity . Advanced directives . List of other physicians . Hospitalizations, surgeries, and ER visits in previous 12 months . Vitals . Screenings to include cognitive, depression,  and falls . Referrals and appointments  In addition, I have reviewed and discussed with patient certain preventive protocols, quality metrics, and best practice recommendations. A written personalized care plan for preventive services as well as general preventive health recommendations were provided to patient.     Gareth Morgan, LPN  8/0/8811 I have reviewed and agree with the above AWV documentation.   Mary-Margaret [name] Done, FNP

## 2017-01-05 NOTE — Patient Instructions (Addendum)
   [name] , Thank you for taking time to come for your Medicare Wellness Visit. I appreciate your ongoing commitment to your health goals. Please review the following plan we discussed and let me know if I can assist you in the future.   These are the goals we discussed: Goals    . Exercise 3x per week (30 min per time)          Continue with exercising 3 times a week for at least 30 min each time    . Have 3 meals a day       This is a list of the screening recommended for you and due dates:  Health Maintenance  Topic Date Due  . Tetanus Vaccine  02/11/2017*  .  Hepatitis C: One time screening is recommended by Center for Disease Control  (CDC) for  adults born from [birth year] through 1965.   06/01/2017*  . Flu Shot  01/28/2017  . Hemoglobin A1C  05/02/2017  . Eye exam for diabetics  07/04/2017  . Complete foot exam   10/30/2017  . Stool Blood Test  12/08/2017  . Colon Cancer Screening  09/12/2019  . Pneumonia vaccines  Completed  *Topic was postponed. The date shown is not the original due date.  Follow up with Dr. Laurance Flatten  We will check price on Tdap at next visit.

## 2017-01-09 ENCOUNTER — Telehealth: Payer: Self-pay | Admitting: Family Medicine

## 2017-01-12 NOTE — Telephone Encounter (Signed)
Pt aware- none

## 2017-01-20 ENCOUNTER — Ambulatory Visit (INDEPENDENT_AMBULATORY_CARE_PROVIDER_SITE_OTHER): Payer: Medicare HMO | Admitting: Urology

## 2017-01-20 DIAGNOSIS — R351 Nocturia: Secondary | ICD-10-CM | POA: Diagnosis not present

## 2017-01-20 DIAGNOSIS — N401 Enlarged prostate with lower urinary tract symptoms: Secondary | ICD-10-CM | POA: Diagnosis not present

## 2017-01-20 DIAGNOSIS — N5201 Erectile dysfunction due to arterial insufficiency: Secondary | ICD-10-CM

## 2017-01-20 DIAGNOSIS — C61 Malignant neoplasm of prostate: Secondary | ICD-10-CM | POA: Diagnosis not present

## 2017-02-18 ENCOUNTER — Telehealth: Payer: Self-pay | Admitting: Family Medicine

## 2017-02-18 NOTE — Telephone Encounter (Signed)
Samples placed up front. Patient aware.

## 2017-03-10 ENCOUNTER — Encounter: Payer: Self-pay | Admitting: Family Medicine

## 2017-03-10 ENCOUNTER — Ambulatory Visit (INDEPENDENT_AMBULATORY_CARE_PROVIDER_SITE_OTHER): Payer: Medicare HMO | Admitting: Family Medicine

## 2017-03-10 VITALS — BP 96/59 | HR 77 | Temp 98.1°F | Ht 68.0 in | Wt 224.0 lb

## 2017-03-10 DIAGNOSIS — J019 Acute sinusitis, unspecified: Secondary | ICD-10-CM | POA: Diagnosis not present

## 2017-03-10 DIAGNOSIS — E611 Iron deficiency: Secondary | ICD-10-CM | POA: Diagnosis not present

## 2017-03-10 DIAGNOSIS — E1169 Type 2 diabetes mellitus with other specified complication: Secondary | ICD-10-CM | POA: Diagnosis not present

## 2017-03-10 DIAGNOSIS — I739 Peripheral vascular disease, unspecified: Secondary | ICD-10-CM | POA: Diagnosis not present

## 2017-03-10 DIAGNOSIS — C61 Malignant neoplasm of prostate: Secondary | ICD-10-CM

## 2017-03-10 DIAGNOSIS — E559 Vitamin D deficiency, unspecified: Secondary | ICD-10-CM | POA: Diagnosis not present

## 2017-03-10 DIAGNOSIS — I10 Essential (primary) hypertension: Secondary | ICD-10-CM | POA: Diagnosis not present

## 2017-03-10 DIAGNOSIS — R05 Cough: Secondary | ICD-10-CM | POA: Diagnosis not present

## 2017-03-10 DIAGNOSIS — E119 Type 2 diabetes mellitus without complications: Secondary | ICD-10-CM

## 2017-03-10 DIAGNOSIS — E785 Hyperlipidemia, unspecified: Secondary | ICD-10-CM | POA: Diagnosis not present

## 2017-03-10 DIAGNOSIS — H6122 Impacted cerumen, left ear: Secondary | ICD-10-CM | POA: Diagnosis not present

## 2017-03-10 DIAGNOSIS — R059 Cough, unspecified: Secondary | ICD-10-CM

## 2017-03-10 LAB — BAYER DCA HB A1C WAIVED: HB A1C: 6.8 % (ref <7.0)

## 2017-03-10 MED ORDER — AMOXICILLIN-POT CLAVULANATE 875-125 MG PO TABS: 1.0000 | ORAL_TABLET | Freq: Two times a day (BID) | ORAL | 0 refills | Status: DC

## 2017-03-10 NOTE — Patient Instructions (Addendum)
Medicare Annual Wellness Visit  [facility] and the medical providers at [facility] strive to bring you the best medical care.  In doing so we not only want to address your current medical conditions and concerns but also to detect new conditions early and prevent illness, disease and health-related problems.    Medicare offers a yearly Wellness Visit which allows our clinical staff to assess your need for preventative services including immunizations, lifestyle education, counseling to decrease risk of preventable diseases and screening for fall risk and other medical concerns.    This visit is provided free of charge (no copay) for all Medicare recipients. The clinical pharmacists at [facility] have begun to conduct these Wellness Visits which will also include a thorough review of all your medications.    As you primary medical provider recommend that you make an appointment for your Annual Wellness Visit if you have not done so already this year.  You may set up this appointment before you leave today or you may call back ([phone]) and schedule an appointment.  Please make sure when you call that you mention that you are scheduling your Annual Wellness Visit with the clinical pharmacist so that the appointment may be made for the proper length of time.     Continue current medications. Continue good therapeutic lifestyle changes which include good diet and exercise. Fall precautions discussed with patient. If an FOBT was given today- please return it to our front desk. If you are over 44 years old - you may need Prevnar 12 or the adult Pneumonia vaccine.  **Flu shots are available--- please call and schedule a FLU-CLINIC appointment**  After your visit with [place] today you will receive a survey in the mail or online from [employer] regarding your care with [place]. Please take a moment to fill this out. Your feedback is very  important to [place] as you can help [place] better understand your patient needs as well as improve your experience and satisfaction. WE CARE ABOUT YOU!!!   Continue to follow-up with urology and cardiology Watch diet closely and exercise as much as possible and take medications regularly. It is very important that you get your eyes examined and checked please try to do this. Continue to monitor blood sugars closely and follow diet closely.

## 2017-03-10 NOTE — Progress Notes (Addendum)
Subjective:    Patient ID: Andrew [name], male    DOB: 04-Jan-1946, 71 y.o.   MRN: [id number]  HPI Pt here for follow up and management of chronic medical problems which includes hypertension, diabetes and hyperlipidemia. He is taking medication regularly.The patient today comes in complaining of some some head congestion with mild fever. He is a diabetic. He will get lab work today. His vital signs are stable and his temp this morning is 98.1. He is followed by the urologist for his PSA and prostate. His chest x-ray is up-to-date. The patient also has an iron deficiency anemia. The patient is doing well overall. He does have this head cold and congestion and no cough that is been going on for about a week with some fever and chills and yellow drainage. Headache. He is also being followed by Dr. Aline August for his prostate cancer and has a return visit scheduled the first of 2019. He denies any chest pain or shortness of breath. He denies any trouble with his intestinal tract including nausea vomiting diarrhea blood in the stool or black tarry bowel movements. He is passing his water without problems but is followed regularly by the urologist. He says that his blood sugars at home have been running around the 120 fasting and during the day 130 240. He's been taking his medicines regularly and trying to get as much exercise as possible.    Patient Active Problem List   Diagnosis Date Noted  . SOB (shortness of breath) 12/10/2016  . Erectile dysfunction due to arterial insufficiency 10/30/2016  . Polyp of colon 10/30/2016  . Peripheral vascular insufficiency ([place]) 06/13/2016  . Vitamin D deficiency 08/24/2015  . Prostate cancer ([place]) 04/07/2014  . Diabetes mellitus type 2, uncontrolled ([place]) 09/14/2010  . BPH (benign prostatic hyperplasia) 09/14/2010  . HTN (hypertension) 09/14/2010  . Hyperlipidemia associated with type 2 diabetes mellitus ([place]) 09/14/2010  . Tobacco abuse 09/14/2010   Outpatient  Encounter Prescriptions as of 03/10/2017  Medication Sig  . ACCU-CHEK AVIVA PLUS test strip CHECK BLOOD SUGAR  UP  TO THREE TIMES DAILY  . aspirin 81 MG tablet Take 81 mg by mouth daily.  Marland Kitchen atorvastatin (LIPITOR) 40 MG tablet TAKE 1 TABLET EVERY DAY  . cholecalciferol (VITAMIN D) 1000 UNITS tablet Take 1,000 Units by mouth daily.   . Cinnamon 500 MG capsule Take 500 mg by mouth daily as needed (blood glucose levels).  . ferrous sulfate 325 (65 FE) MG EC tablet Take 1 tablet (325 mg total) by mouth daily with breakfast.  . lisinopril-hydrochlorothiazide (PRINZIDE,ZESTORETIC) 20-25 MG tablet TAKE 1 TABLET EVERY DAY  . metFORMIN (GLUCOPHAGE) 1000 MG tablet TAKE 1 TABLET TWICE DAILY WITH MEALS  . sitaGLIPtin (JANUVIA) 100 MG tablet Take 1 tablet (100 mg total) by mouth daily.  . tamsulosin (FLOMAX) 0.4 MG CAPS capsule TAKE 1 CAPSULE EVERY DAY   No facility-administered encounter medications on file as of 03/10/2017.       Review of Systems  Constitutional: Negative.   HENT: Positive for congestion ("head cold" ).   Eyes: Negative.   Respiratory: Negative.   Cardiovascular: Negative.   Gastrointestinal: Negative.   Endocrine: Negative.   Genitourinary: Negative.   Musculoskeletal: Negative.   Skin: Negative.   Allergic/Immunologic: Negative.   Neurological: Negative.   Hematological: Negative.   Psychiatric/Behavioral: Negative.        Objective:   Physical Exam  Constitutional: He is oriented to person, place, and time. He appears well-developed and well-nourished.  No distress.  The patient is calm and pleasant and alert  HENT:  Head: Normocephalic and atraumatic.  Right Ear: External ear normal.  Nose: Nose normal.  Mouth/Throat: Oropharynx is clear and moist. No oropharyngeal exudate.  Ears cerumen impaction left ear canal  Eyes: Pupils are equal, round, and reactive to light. Conjunctivae and EOM are normal. Right eye exhibits no discharge. Left eye exhibits no discharge. No  scleral icterus.  Patient needs to get eye exam and he is aware of this.  Neck: Normal range of motion. Neck supple. No thyromegaly present.  No bruits or anterior cervical adenopathy or thyromegaly  Cardiovascular: Normal rate, regular rhythm, normal heart sounds and intact distal pulses.   No murmur heard. Heart is regular at 72/m  Pulmonary/Chest: Effort normal and breath sounds normal. No respiratory distress. He has no wheezes. He has no rales. He exhibits no tenderness.  No axillary adenopathy Lungs were clear anteriorly and posteriorly but increased congestion with coughing.  Abdominal: Soft. Bowel sounds are normal. He exhibits no mass. There is no tenderness. There is no rebound and no guarding.  Abdominal obesity without masses tenderness or organ enlargement or bruits  Genitourinary:  Genitourinary Comments: The patient is followed regularly because of his prostate cancer by the urologist.  Musculoskeletal: Normal range of motion. He exhibits no edema.  Lymphadenopathy:    He has no cervical adenopathy.  Neurological: He is alert and oriented to person, place, and time. He has normal reflexes. No cranial nerve deficit.  Skin: Skin is warm and dry. No rash noted.  Psychiatric: He has a normal mood and affect. His behavior is normal. Judgment and thought content normal.  Nursing note and vitals reviewed.  BP (!) 96/59 (BP Location: Left Arm)   Pulse 77   Temp 98.1 F (36.7 C) (Oral)   Ht '5\' 8"'  (1.727 m)   Wt 224 lb (101.6 kg)   BMI 34.06 kg/m   Ear irrigation to remove cerumen from left ear canal      Assessment & Plan:   1. Essential hypertension -Continue current treatment and watch sodium intake - BMP8+EGFR - CBC with Differential/Platelet - Hepatic function panel  2. Type 2 diabetes mellitus without complication, without long-term current use of insulin ([place]) -Continue current treatment pending results of lab work - Ambulatory referral to [facility] with  Differential/Platelet - [facility] [place] Hb A1c Waived  3. Hyperlipidemia associated with type 2 diabetes mellitus ([place]) -[place] current treatment pending results of lab work - CBC with Differential/Platelet - Lipid panel  4. Vitamin D deficiency -Continue current treatment - CBC with Differential/Platelet - VITAMIN D 25 Hydroxy (Vit-D Deficiency, Fractures)  5. Prostate cancer [facility]) -Follow-up with urology as planned - CBC with Differential/Platelet  6. Peripheral vascular insufficiency (HCC) -Walk regularly watch cholesterol in diet and keep blood sugar under good control. Bring blood sugar readings to each visit.  7. Low iron -Check CBC  8. Left ear impacted cerumen -Irrigation to remove cerumen  9. Cough -Take Augmentin 875 twice daily with food and use Mucinex maximum strength plain 1 twice daily with a large glass of water  10. Acute rhinosinusitis -Take antibiotic as directed  Meds ordered this encounter  Medications  . amoxicillin-clavulanate (AUGMENTIN) 875-125 MG tablet    Sig: Take 1 tablet by mouth 2 (two) times daily.    Dispense:  20 tablet    Refill:  0   Patient Instructions  Medicare Annual Wellness Visit  [place] and the medical providers at [facility] strive to bring you the best medical care.  In doing so we not only want to address your current medical conditions and concerns but also to detect new conditions early and prevent illness, disease and health-related problems.    Medicare offers a yearly Wellness Visit which allows our clinical staff to assess your need for preventative services including immunizations, lifestyle education, counseling to decrease risk of preventable diseases and screening for fall risk and other medical concerns.    This visit is provided free of charge (no copay) for all Medicare recipients. The clinical pharmacists at [facility] have begun to conduct  these Wellness Visits which will also include a thorough review of all your medications.    As you primary medical provider recommend that you make an appointment for your Annual Wellness Visit if you have not done so already this year.  You may set up this appointment before you leave today or you may call back ([phone]) and schedule an appointment.  Please make sure when you call that you mention that you are scheduling your Annual Wellness Visit with the clinical pharmacist so that the appointment may be made for the proper length of time.     Continue current medications. Continue good therapeutic lifestyle changes which include good diet and exercise. Fall precautions discussed with patient. If an FOBT was given today- please return it to our front desk. If you are over 15 years old - you may need Prevnar 56 or the adult Pneumonia vaccine.  **Flu shots are available--- please call and schedule a FLU-CLINIC appointment**  After your visit with [place] today you will receive a survey in the mail or online from [employer] regarding your care with [place]. Please take a moment to fill this out. Your feedback is very important to [place] as you can help [place] better understand your patient needs as well as improve your experience and satisfaction. WE CARE ABOUT YOU!!!   Continue to follow-up with urology and cardiology Watch diet closely and exercise as much as possible and take medications regularly. It is very important that you get your eyes examined and checked please try to do this. Continue to monitor blood sugars closely and follow diet closely.   Arrie Senate MD

## 2017-03-11 LAB — HEPATIC FUNCTION PANEL
ALK PHOS: 104 IU/L (ref 39–117)
ALT: 20 IU/L (ref 0–44)
AST: 21 IU/L (ref 0–40)
Albumin: 3.8 g/dL (ref 3.5–4.8)
Bilirubin Total: 0.5 mg/dL (ref 0.0–1.2)
Bilirubin, Direct: 0.13 mg/dL (ref 0.00–0.40)
TOTAL PROTEIN: 7.1 g/dL (ref 6.0–8.5)

## 2017-03-11 LAB — CBC WITH DIFFERENTIAL/PLATELET
BASOS ABS: 0 10*3/uL (ref 0.0–0.2)
BASOS: 0 %
EOS (ABSOLUTE): 0.2 10*3/uL (ref 0.0–0.4)
Eos: 4 %
Hematocrit: 36.9 % — ABNORMAL LOW (ref 37.5–51.0)
Hemoglobin: 12.7 g/dL — ABNORMAL LOW (ref 13.0–17.7)
IMMATURE GRANS (ABS): 0 10*3/uL (ref 0.0–0.1)
Immature Granulocytes: 0 %
LYMPHS: 37 %
Lymphocytes Absolute: 2.1 10*3/uL (ref 0.7–3.1)
MCH: 30.6 pg (ref 26.6–33.0)
MCHC: 34.4 g/dL (ref 31.5–35.7)
MCV: 89 fL (ref 79–97)
Monocytes Absolute: 0.4 10*3/uL (ref 0.1–0.9)
Monocytes: 7 %
NEUTROS ABS: 2.9 10*3/uL (ref 1.4–7.0)
NEUTROS PCT: 52 %
Platelets: 238 10*3/uL (ref 150–379)
RBC: 4.15 x10E6/uL (ref 4.14–5.80)
RDW: 13.9 % (ref 12.3–15.4)
WBC: 5.6 10*3/uL (ref 3.4–10.8)

## 2017-03-11 LAB — LIPID PANEL
CHOL/HDL RATIO: 4.2 ratio (ref 0.0–5.0)
Cholesterol, Total: 127 mg/dL (ref 100–199)
HDL: 30 mg/dL — ABNORMAL LOW (ref >39)
LDL CALC: 69 mg/dL (ref 0–99)
TRIGLYCERIDES: 142 mg/dL (ref 0–149)
VLDL Cholesterol Cal: 28 mg/dL (ref 5–40)

## 2017-03-11 LAB — BMP8+EGFR
BUN/Creatinine Ratio: 12 (ref 10–24)
BUN: 18 mg/dL (ref 8–27)
CALCIUM: 9.3 mg/dL (ref 8.6–10.2)
CHLORIDE: 97 mmol/L (ref 96–106)
CO2: 24 mmol/L (ref 20–29)
Creatinine, Ser: 1.45 mg/dL — ABNORMAL HIGH (ref 0.76–1.27)
GFR, EST AFRICAN AMERICAN: 56 mL/min/{1.73_m2} — AB (ref >59)
GFR, EST NON AFRICAN AMERICAN: 48 mL/min/{1.73_m2} — AB (ref >59)
Glucose: 122 mg/dL — ABNORMAL HIGH (ref 65–99)
Potassium: 3.8 mmol/L (ref 3.5–5.2)
SODIUM: 137 mmol/L (ref 134–144)

## 2017-03-11 LAB — VITAMIN D 25 HYDROXY (VIT D DEFICIENCY, FRACTURES): VIT D 25 HYDROXY: 37.2 ng/mL (ref 30.0–100.0)

## 2017-03-11 NOTE — Telephone Encounter (Signed)
Pt notified samples are ready for pick-up.  

## 2017-03-13 ENCOUNTER — Other Ambulatory Visit: Payer: Self-pay

## 2017-03-13 DIAGNOSIS — N289 Disorder of kidney and ureter, unspecified: Secondary | ICD-10-CM

## 2017-03-30 ENCOUNTER — Other Ambulatory Visit: Payer: Self-pay | Admitting: Family Medicine

## 2017-03-30 MED ORDER — METFORMIN HCL 1000 MG PO TABS
1000.0000 mg | ORAL_TABLET | Freq: Two times a day (BID) | ORAL | 1 refills | Status: DC
Start: 2017-03-30 — End: 2017-09-11

## 2017-03-30 NOTE — Telephone Encounter (Signed)
Pt aware refill sent to pharmacy 

## 2017-04-14 ENCOUNTER — Ambulatory Visit (INDEPENDENT_AMBULATORY_CARE_PROVIDER_SITE_OTHER): Payer: Medicare HMO | Admitting: *Deleted

## 2017-04-14 ENCOUNTER — Other Ambulatory Visit: Payer: Medicare HMO

## 2017-04-14 ENCOUNTER — Other Ambulatory Visit: Payer: Self-pay | Admitting: Family Medicine

## 2017-04-14 DIAGNOSIS — N289 Disorder of kidney and ureter, unspecified: Secondary | ICD-10-CM

## 2017-04-14 DIAGNOSIS — Z23 Encounter for immunization: Secondary | ICD-10-CM

## 2017-04-14 NOTE — Telephone Encounter (Signed)
Samples ready for pick up

## 2017-04-15 LAB — BMP8+EGFR
BUN/Creatinine Ratio: 11 (ref 10–24)
BUN: 14 mg/dL (ref 8–27)
CALCIUM: 9 mg/dL (ref 8.6–10.2)
CO2: 25 mmol/L (ref 20–29)
CREATININE: 1.27 mg/dL (ref 0.76–1.27)
Chloride: 101 mmol/L (ref 96–106)
GFR calc Af Amer: 65 mL/min/{1.73_m2} (ref >59)
GFR, EST NON AFRICAN AMERICAN: 56 mL/min/{1.73_m2} — AB (ref >59)
Glucose: 104 mg/dL — ABNORMAL HIGH (ref 65–99)
POTASSIUM: 3.9 mmol/L (ref 3.5–5.2)
Sodium: 140 mmol/L (ref 134–144)

## 2017-04-20 ENCOUNTER — Other Ambulatory Visit: Payer: Self-pay | Admitting: Family Medicine

## 2017-05-15 ENCOUNTER — Other Ambulatory Visit: Payer: Self-pay | Admitting: Family Medicine

## 2017-06-04 ENCOUNTER — Other Ambulatory Visit: Payer: Self-pay | Admitting: Family Medicine

## 2017-06-10 ENCOUNTER — Telehealth: Payer: Self-pay | Admitting: Family Medicine

## 2017-06-10 NOTE — Telephone Encounter (Signed)
Requesting samples ofsitaGLIPtin (JANUVIA) 100 MG tablet

## 2017-06-12 ENCOUNTER — Telehealth: Payer: Self-pay | Admitting: Family Medicine

## 2017-06-12 NOTE — Telephone Encounter (Signed)
Been requesting sample of sitaGLIPtin (JANUVIA) 100 MG tablet fo 2 days no response. Please advise.

## 2017-06-16 NOTE — Telephone Encounter (Signed)
Two packs available and placed at front, patient aware

## 2017-06-17 DIAGNOSIS — N401 Enlarged prostate with lower urinary tract symptoms: Secondary | ICD-10-CM | POA: Diagnosis not present

## 2017-06-25 NOTE — Telephone Encounter (Signed)
Several attempts have been made to patient - no call back. This encounter will be closed.

## 2017-06-25 NOTE — Telephone Encounter (Signed)
Attempts have been made to contact patient - no call back. This encounter will be closed.  

## 2017-07-01 ENCOUNTER — Telehealth: Payer: Self-pay | Admitting: Family Medicine

## 2017-07-02 ENCOUNTER — Emergency Department (HOSPITAL_COMMUNITY)
Admission: EM | Admit: 2017-07-02 | Discharge: 2017-07-02 | Disposition: A | Discharge status: Home / Self Care | Payer: Medicare HMO | Attending: Emergency Medicine | Admitting: Emergency Medicine

## 2017-07-02 ENCOUNTER — Emergency Department (HOSPITAL_COMMUNITY): Payer: Medicare HMO

## 2017-07-02 ENCOUNTER — Other Ambulatory Visit: Payer: Self-pay

## 2017-07-02 ENCOUNTER — Encounter (HOSPITAL_COMMUNITY): Payer: Self-pay | Admitting: *Deleted

## 2017-07-02 DIAGNOSIS — R531 Weakness: Secondary | ICD-10-CM | POA: Diagnosis not present

## 2017-07-02 DIAGNOSIS — Z79899 Other long term (current) drug therapy: Secondary | ICD-10-CM | POA: Insufficient documentation

## 2017-07-02 DIAGNOSIS — R404 Transient alteration of awareness: Secondary | ICD-10-CM | POA: Diagnosis not present

## 2017-07-02 DIAGNOSIS — Z7982 Long term (current) use of aspirin: Secondary | ICD-10-CM | POA: Diagnosis not present

## 2017-07-02 DIAGNOSIS — Z8679 Personal history of other diseases of the circulatory system: Secondary | ICD-10-CM | POA: Diagnosis not present

## 2017-07-02 DIAGNOSIS — I10 Essential (primary) hypertension: Secondary | ICD-10-CM | POA: Insufficient documentation

## 2017-07-02 DIAGNOSIS — F17210 Nicotine dependence, cigarettes, uncomplicated: Secondary | ICD-10-CM | POA: Diagnosis not present

## 2017-07-02 DIAGNOSIS — Z7984 Long term (current) use of oral hypoglycemic drugs: Secondary | ICD-10-CM | POA: Diagnosis not present

## 2017-07-02 DIAGNOSIS — R42 Dizziness and giddiness: Secondary | ICD-10-CM | POA: Diagnosis not present

## 2017-07-02 DIAGNOSIS — E119 Type 2 diabetes mellitus without complications: Secondary | ICD-10-CM | POA: Insufficient documentation

## 2017-07-02 LAB — CBC WITH DIFFERENTIAL/PLATELET
BASOS ABS: 0 10*3/uL (ref 0.0–0.1)
BASOS PCT: 0 %
Eosinophils Absolute: 0.1 10*3/uL (ref 0.0–0.7)
Eosinophils Relative: 2 %
HCT: 37 % — ABNORMAL LOW (ref 39.0–52.0)
Hemoglobin: 11.8 g/dL — ABNORMAL LOW (ref 13.0–17.0)
LYMPHS PCT: 24 %
Lymphs Abs: 1.8 10*3/uL (ref 0.7–4.0)
MCH: 29.4 pg (ref 26.0–34.0)
MCHC: 31.9 g/dL (ref 30.0–36.0)
MCV: 92.3 fL (ref 78.0–100.0)
Monocytes Absolute: 0.5 10*3/uL (ref 0.1–1.0)
Monocytes Relative: 6 %
NEUTROS ABS: 5 10*3/uL (ref 1.7–7.7)
Neutrophils Relative %: 68 %
PLATELETS: 214 10*3/uL (ref 150–400)
RBC: 4.01 MIL/uL — AB (ref 4.22–5.81)
RDW: 13.7 % (ref 11.5–15.5)
WBC: 7.4 10*3/uL (ref 4.0–10.5)

## 2017-07-02 LAB — URINALYSIS, ROUTINE W REFLEX MICROSCOPIC
Bilirubin Urine: NEGATIVE
Glucose, UA: NEGATIVE mg/dL
Hgb urine dipstick: NEGATIVE
Ketones, ur: NEGATIVE mg/dL
LEUKOCYTES UA: NEGATIVE
NITRITE: NEGATIVE
PROTEIN: NEGATIVE mg/dL
Specific Gravity, Urine: 1.018 (ref 1.005–1.030)
pH: 7 (ref 5.0–8.0)

## 2017-07-02 LAB — I-STAT TROPONIN, ED: TROPONIN I, POC: 0 ng/mL (ref 0.00–0.08)

## 2017-07-02 LAB — BASIC METABOLIC PANEL
ANION GAP: 11 (ref 5–15)
BUN: 12 mg/dL (ref 6–20)
CO2: 26 mmol/L (ref 22–32)
Calcium: 9 mg/dL (ref 8.9–10.3)
Chloride: 103 mmol/L (ref 101–111)
Creatinine, Ser: 1.18 mg/dL (ref 0.61–1.24)
GLUCOSE: 159 mg/dL — AB (ref 65–99)
POTASSIUM: 3.9 mmol/L (ref 3.5–5.1)
Sodium: 140 mmol/L (ref 135–145)

## 2017-07-02 NOTE — ED Provider Notes (Signed)
[facility] EMERGENCY DEPARTMENT Provider Note   CSN: [id number] Arrival date & time: 07/02/17  0307     History   Chief Complaint Chief Complaint  Patient presents with  . Weakness    HPI Andrew [name] is a 72 y.o. male.  Patient brought to the emergency department from home by ambulance.  He reports that he got up to go to the bathroom and felt very weak.  He had trouble standing up, felt dizzy.  EMS report that he had normal orthostatic vital signs.  He was transported to the ER, at time of arrival he reports that he has had significant improvement.  His weakness has resolved and he feels back to his normal baseline.  He denies any recent illness.  He has not had any chest pain, heart palpitations, cough, shortness of breath, vomiting, diarrhea.      Past Medical History:  Diagnosis Date  . Anemia   . BPH (benign prostatic hyperplasia)    Sees Dr [name]  . Cataract   . Diabetes mellitus without complication ([place])   . Hyperlipidemia   . Hypertension   . Low serum vitamin D   . Prostate cancer ([place])    w/seed implantation and radiation    Patient Active Problem List   Diagnosis Date Noted  . SOB (shortness of breath) 12/10/2016  . Erectile dysfunction due to arterial insufficiency 10/30/2016  . Polyp of colon 10/30/2016  . Peripheral vascular insufficiency ([place]) 06/13/2016  . Vitamin D deficiency 08/24/2015  . Prostate cancer ([place]) 04/07/2014  . Diabetes mellitus type 2, uncontrolled ([place]) 09/14/2010  . BPH (benign prostatic hyperplasia) 09/14/2010  . HTN (hypertension) 09/14/2010  . Hyperlipidemia associated with type 2 diabetes mellitus ([place]) 09/14/2010  . Tobacco abuse 09/14/2010    Past Surgical History:  Procedure Laterality Date  . COLONOSCOPY  2015  . PROSTATE SURGERY  2008   seed implant       Home Medications    Prior to Admission medications   Medication Sig Start Date End Date Taking? Authorizing Provider  ACCU-CHEK AVIVA PLUS test strip  CHECK BLOOD SUGAR  UP  TO THREE TIMES DAILY 05/29/15   Chipper Herb, MD  amoxicillin-clavulanate (AUGMENTIN) 875-125 MG tablet Take 1 tablet by mouth 2 (two) times daily. 03/10/17   Chipper Herb, MD  aspirin 81 MG tablet Take 81 mg by mouth daily.    [provider]  atorvastatin (LIPITOR) 40 MG tablet TAKE 1 TABLET EVERY DAY 04/21/17   Chipper Herb, MD  cholecalciferol (VITAMIN D) 1000 UNITS tablet Take 1,000 Units by mouth daily.     [provider]  Cinnamon 500 MG capsule Take 500 mg by mouth daily as needed (blood glucose levels).    [provider]  ferrous sulfate 325 (65 FE) MG tablet TAKE 1 TABLET BY MOUTH ONCE DAILY WITH BREAKFAST 05/15/17   Chipper Herb, MD  lisinopril-hydrochlorothiazide (PRINZIDE,ZESTORETIC) 20-25 MG tablet TAKE 1 TABLET EVERY DAY 04/21/17   Chipper Herb, MD  metFORMIN (GLUCOPHAGE) 1000 MG tablet Take 1 tablet (1,000 mg total) by mouth 2 (two) times daily with a meal. 03/30/17   Chipper Herb, MD  sitaGLIPtin (JANUVIA) 100 MG tablet Take 1 tablet (100 mg total) by mouth daily. 12/15/16   Cherre Robins, PharmD  tamsulosin (FLOMAX) 0.4 MG CAPS capsule TAKE 1 CAPSULE EVERY DAY 12/02/16   Chipper Herb, MD    Family History Family History  Problem Relation Age of Onset  .  Diabetes Mother   . Heart disease Mother   . Kidney disease Mother        DIALYSIS  . Emphysema Father   . Deep vein thrombosis Sister   . Kidney disease Sister   . Cancer Sister        lung  . Colon polyps Brother   . Hypertension Brother   . Gout Brother   . Colon cancer Neg Hx   . Esophageal cancer Neg Hx   . Rectal cancer Neg Hx   . Prostate cancer Neg Hx     Social History Social History   Tobacco Use  . Smoking status: Current Every Day Smoker    Packs/day: 1.00    Types: Cigarettes    Start date: 06/30/1965  . Smokeless tobacco: Never Used  Substance Use Topics  . Alcohol use: No  . Drug use: No     Allergies   Patient has no  known allergies.   Review of Systems Review of Systems  Neurological: Positive for dizziness and weakness.  All other systems reviewed and are negative.    Physical Exam Updated Vital Signs BP 134/71 (BP Location: Right Arm)   Pulse 60   Temp 97.7 F (36.5 C) (Oral)   Resp 16   Ht 5\' 9"  (1.753 m)   Wt 104.3 kg (230 lb)   SpO2 99%   BMI 33.97 kg/m   Physical Exam  Constitutional: He is oriented to person, place, and time. He appears well-developed and well-nourished. No distress.  HENT:  Head: Normocephalic and atraumatic.  Right Ear: Hearing normal.  Left Ear: Hearing normal.  Nose: Nose normal.  Mouth/Throat: Oropharynx is clear and moist and mucous membranes are normal.  Eyes: Conjunctivae and EOM are normal. Pupils are equal, round, and reactive to light.  Neck: Normal range of motion. Neck supple.  Cardiovascular: Regular rhythm, S1 normal and S2 normal. Exam reveals no gallop and no friction rub.  No murmur heard. Pulmonary/Chest: Effort normal and breath sounds normal. No respiratory distress. He exhibits no tenderness.  Abdominal: Soft. Normal appearance and bowel sounds are normal. There is no hepatosplenomegaly. There is no tenderness. There is no rebound, no guarding, no tenderness at McBurney's point and negative Murphy's sign. No hernia.  Musculoskeletal: Normal range of motion.  Neurological: He is alert and oriented to person, place, and time. He has normal strength. No cranial nerve deficit or sensory deficit. Coordination normal. GCS eye subscore is 4. GCS verbal subscore is 5. GCS motor subscore is 6.  Skin: Skin is warm, dry and intact. No rash noted. No cyanosis.  Psychiatric: He has a normal mood and affect. His speech is normal and behavior is normal. Thought content normal.  Nursing note and vitals reviewed.    ED Treatments / Results  Labs (all labs ordered are listed, but only abnormal results are displayed) Labs Reviewed  CBC WITH  DIFFERENTIAL/PLATELET - Abnormal; Notable for the following components:      Result Value   RBC 4.01 (*)    Hemoglobin 11.8 (*)    HCT 37.0 (*)    All other components within normal limits  BASIC METABOLIC PANEL - Abnormal; Notable for the following components:   Glucose, Bld 159 (*)    All other components within normal limits  URINALYSIS, ROUTINE W REFLEX MICROSCOPIC  I-STAT TROPONIN, ED    EKG  EKG Interpretation  Date/Time:  Thursday July 02 2017 03:16:06 EST Ventricular Rate:  58 PR Interval:  QRS Duration: 92 QT Interval:  403 QTC Calculation: 396 R Axis:   79 Text Interpretation:  Sinus rhythm Normal ECG Confirmed by Orpah Greek [phone] on 07/02/2017 3:17:37 AM       Radiology Dg Chest 2 View  Result Date: 07/02/2017 CLINICAL DATA:  Patient states no chest complaints. States he stood up during the night and felt lightheaded. History of diabetes, hypertension, and current smoker. EXAM: CHEST  2 VIEW COMPARISON:  10/30/2016 FINDINGS: The heart size and mediastinal contours are within normal limits. Both lungs are clear. The visualized skeletal structures are unremarkable. IMPRESSION: No active cardiopulmonary disease. Electronically Signed   By: Lucienne Capers M.D.   On: 07/02/2017 03:38   Ct Head Wo Contrast  Result Date: 07/02/2017 CLINICAL DATA:  Weakness.  Vertigo, episodic, peripheral. EXAM: CT HEAD WITHOUT CONTRAST TECHNIQUE: Contiguous axial images were obtained from the base of the skull through the vertex without intravenous contrast. COMPARISON:  None. FINDINGS: Brain: Age related atrophy. Moderate chronic small vessel ischemia. No intracranial hemorrhage, mass effect, or midline shift. No hydrocephalus. The basilar cisterns are patent. No evidence of territorial infarct or acute ischemia. No extra-axial or intracranial fluid collection. Vascular: Atherosclerosis of skullbase vasculature without hyperdense vessel or abnormal calcification. Skull: No  fracture or focal lesion. Sinuses/Orbits: Paranasal sinuses and mastoid air cells are clear. The visualized orbits are unremarkable. Other: None. IMPRESSION: 1.  No acute intracranial abnormality. 2. Age related atrophy.  Moderate chronic small vessel ischemia. Electronically Signed   By: Jeb Levering M.D.   On: 07/02/2017 05:12    Procedures Procedures (including critical care time)  Medications Ordered in ED Medications - No data to display   Initial Impression / Assessment and Plan / ED Course  I have reviewed the triage vital signs and the nursing notes.  Pertinent labs & imaging results that were available during my care of the patient were reviewed by me and considered in my medical decision making (see chart for details).     Patient presents to the ER for evaluation of weakness.  Patient awakened from sleep and felt weak and had some dizziness.  By the time he presented to the ER, however, symptoms have resolved.  Vital signs are normal.  Patient appears well.  His neurologic and physical examination are entirely normal.  Workup has been reassuringly normal as well.  Patient reassured, appropriate for discharge, follow-up with his doctor as needed.  Final Clinical Impressions(s) / ED Diagnoses   Final diagnoses:  Weakness    ED Discharge Orders    None       Kemontae Dunklee, Gwenyth Allegra, MD 07/02/17 [phone]

## 2017-07-02 NOTE — ED Triage Notes (Signed)
Pt brought in by [facility] for c/o weakness; pt states he got up at 2300 and went to bathroom and started to feel dizzy and weak; pt denies any pain

## 2017-07-02 NOTE — Telephone Encounter (Signed)
Left message, no Januvia samples available.

## 2017-07-02 NOTE — ED Notes (Signed)
Patient transported to X-ray 

## 2017-07-09 ENCOUNTER — Telehealth: Payer: Self-pay | Admitting: Family Medicine

## 2017-07-09 NOTE — Telephone Encounter (Signed)
Left message stating Januvia samples left up front and to call back with any further questions or concerns.

## 2017-07-20 DIAGNOSIS — C61 Malignant neoplasm of prostate: Secondary | ICD-10-CM | POA: Diagnosis not present

## 2017-07-24 NOTE — Progress Notes (Signed)
Subjective:    Patient ID: Andrew [name], male    DOB: 07-02-1945, 72 y.o.   MRN: [id number]  HPI Pt here for follow up and management of chronic medical problems which include hypertension, hyperlipidemia, anemia, BPH and diabetes.  The patient is doing well overall with no specific complaints.  His vital signs are stable.  He will get his lab work today.  He brings in blood sugars for review and the majority of these are good fasting with readings from the low 90s up to as high as 150.  During the day the numbers are between 106 up to 152.  These will be scanned into the record.  The patient does have prostate cancer and has had seed implants.  He plans to see his urologist, Dr. Alyson Ingles this Friday.  He is also due to get his yearly eye exam soon and will make sure that we get a copy of that report.  He is not been getting a lot of exercise based on the history that I obtained from him and I emphasized to him the importance about regular exercise.  He denies any chest pain or shortness of breath.  He denies any trouble with swallowing nausea vomiting diarrhea blood in the stool or black tarry bowel movements.  He does take iron and this makes his stools darker.  He also is taking ranitidine and this is helped control his heartburn.  He is passing his water without problems and with good control.  He is pleasant and relaxed today.   Review of Systems  Constitutional: Positive for fatigue (slight).  HENT: Negative.   Respiratory: Negative.   Cardiovascular: Negative.   Gastrointestinal: Negative.   Genitourinary: Negative.   Musculoskeletal: Negative.   Neurological: Negative.   Psychiatric/Behavioral: Positive for sleep disturbance (occasional).       Objective:   Physical Exam  Constitutional: He is oriented to person, place, and time. He appears well-developed and well-nourished. No distress.  The patient is calm pleasant and relaxed  HENT:  Head: Normocephalic and atraumatic.  Right  Ear: External ear normal.  Left Ear: External ear normal.  Mouth/Throat: Oropharynx is clear and moist. No oropharyngeal exudate.  Slight nasal congestion bilaterally  Eyes: Conjunctivae and EOM are normal. Pupils are equal, round, and reactive to light. Right eye exhibits no discharge. Left eye exhibits no discharge. No scleral icterus.  Neck: Normal range of motion. Neck supple. No thyromegaly present.  No bruits thyromegaly or anterior cervical adenopathy  Cardiovascular: Normal rate, regular rhythm, normal heart sounds and intact distal pulses.  No murmur heard. Heart has a regular rate and rhythm at 72/min  Pulmonary/Chest: Effort normal and breath sounds normal. No respiratory distress. He has no wheezes. He has no rales. He exhibits no tenderness.  Clear anteriorly and posteriorly without any axillary adenopathy  Abdominal: Soft. Bowel sounds are normal. He exhibits no mass. There is no tenderness. There is no rebound and no guarding.  Abdominal obesity without masses tenderness or organ enlargement or bruits  Genitourinary: Rectum normal.  Musculoskeletal: Normal range of motion. He exhibits no edema.  Bilateral bunions with long curved toenails on several of the toes of each foot.  Lymphadenopathy:    He has no cervical adenopathy.  Neurological: He is alert and oriented to person, place, and time. He has normal reflexes. No cranial nerve deficit.  Skin: Skin is warm and dry. No rash noted.  Psychiatric: He has a normal mood and affect. His behavior is  normal. Judgment and thought content normal.  Nursing note and vitals reviewed.  BP 130/69 (BP Location: Left Arm, Patient Position: Sitting, Cuff Size: Normal)   Pulse (!) 102   Temp (!) 97 F (36.1 C) (Oral)   Ht _0  (1.727 m)   Wt 220 lb 6.4 oz (100 kg)   BMI 33.51 kg/m         Assessment & Plan:  1. Other iron deficiency anemia -Continue with current treatment pending results of lab work - CBC with  Differential/Platelet; Future  2. Other hyperlipidemia -Continue with current treatment pending results of lab work - Hepatic function panel; Future - Lipid panel; Future  3. Uncontrolled type 2 diabetes mellitus with hyperglycemia ([place]) -Home blood sugars appear to be under good control, hemoglobin A1c is pending - POCT glycosylated hemoglobin (Hb A1C); Future - BMP8+EGFR; Future  4. Essential hypertension -Blood pressure is good today continue with current treatment - CBC with Differential/Platelet; Future - BMP8+EGFR; Future  5. History of BPH -Follow-up with urology in 4 days for prostate cancer - PSA, total and free; Future  6. Other fatigue - CBC with Differential/Platelet; Future - Thyroid Panel With TSH  7. Vitamin D deficiency -Continue current treatment pending results of lab work  8. Peripheral vascular insufficiency (HCC) -Pulses were present but diminished bilaterally.  9. Prostate cancer [facility]) -The patient has had seed implants and will follow up with the urologist this Friday  10. Type 2 diabetes mellitus with insulin therapy ([place]) -Continue with aggressive therapeutic lifestyle changes and current treatment pending results of lab work  11. Hyperlipidemia associated with type 2 diabetes mellitus ([place]) -Continue with current treatment pending results of lab work  86. Bilateral bunions -Follow-up with podiatrist for nail trimming and monitoring.  Patient Instructions  Continue current medications. Continue good therapeutic lifestyle changes which include good diet and exercise. Fall precautions discussed with patient. If an FOBT was given today- please return it to our front desk. If you are over 40 years old - you may need Prevnar 55 or the adult Pneumonia vaccine.  **Flu shots are available--- please call and schedule a FLU-CLINIC appointment**  After your visit with [place] today you will receive a survey in the mail or online from [employer] regarding  your care with [place]. Please take a moment to fill this out. Your feedback is very important to [place] as you can help [place] better understand your patient needs as well as improve your experience and satisfaction. WE CARE ABOUT YOU!!!   Continue with follow-up visit with urologist and ophthalmologist. Make every effort to exercise daily with walking especially during the winter months Drink plenty of fluids and stay well-hydrated Continue to check blood sugars periodically and bring these readings into the next visit Schedule eye exam visit since you say this is due about now. See podiatrist for nail trimming Come by and pick up a copy of your blood work to take to the urologist  Arrie Senate MD

## 2017-07-27 ENCOUNTER — Ambulatory Visit (INDEPENDENT_AMBULATORY_CARE_PROVIDER_SITE_OTHER): Payer: Medicare HMO | Admitting: Family Medicine

## 2017-07-27 ENCOUNTER — Encounter: Payer: Self-pay | Admitting: Family Medicine

## 2017-07-27 VITALS — BP 130/69 | HR 102 | Temp 97.0°F | Ht 68.0 in | Wt 220.4 lb

## 2017-07-27 DIAGNOSIS — Z794 Long term (current) use of insulin: Secondary | ICD-10-CM

## 2017-07-27 DIAGNOSIS — M21611 Bunion of right foot: Secondary | ICD-10-CM

## 2017-07-27 DIAGNOSIS — E7849 Other hyperlipidemia: Secondary | ICD-10-CM | POA: Diagnosis not present

## 2017-07-27 DIAGNOSIS — C61 Malignant neoplasm of prostate: Secondary | ICD-10-CM | POA: Diagnosis not present

## 2017-07-27 DIAGNOSIS — I10 Essential (primary) hypertension: Secondary | ICD-10-CM

## 2017-07-27 DIAGNOSIS — Z87438 Personal history of other diseases of male genital organs: Secondary | ICD-10-CM

## 2017-07-27 DIAGNOSIS — E1165 Type 2 diabetes mellitus with hyperglycemia: Secondary | ICD-10-CM | POA: Diagnosis not present

## 2017-07-27 DIAGNOSIS — E559 Vitamin D deficiency, unspecified: Secondary | ICD-10-CM

## 2017-07-27 DIAGNOSIS — E1169 Type 2 diabetes mellitus with other specified complication: Secondary | ICD-10-CM

## 2017-07-27 DIAGNOSIS — E119 Type 2 diabetes mellitus without complications: Secondary | ICD-10-CM

## 2017-07-27 DIAGNOSIS — D508 Other iron deficiency anemias: Secondary | ICD-10-CM

## 2017-07-27 DIAGNOSIS — M21612 Bunion of left foot: Secondary | ICD-10-CM

## 2017-07-27 DIAGNOSIS — R5383 Other fatigue: Secondary | ICD-10-CM

## 2017-07-27 DIAGNOSIS — E785 Hyperlipidemia, unspecified: Secondary | ICD-10-CM

## 2017-07-27 DIAGNOSIS — I739 Peripheral vascular disease, unspecified: Secondary | ICD-10-CM

## 2017-07-27 NOTE — Addendum Note (Signed)
Addended by: Earlene Plater on: 07/27/2017 09:51 AM   Modules accepted: Orders

## 2017-07-27 NOTE — Patient Instructions (Addendum)
Continue current medications. Continue good therapeutic lifestyle changes which include good diet and exercise. Fall precautions discussed with patient. If an FOBT was given today- please return it to our front desk. If you are over 72 years old - you may need Prevnar 20 or the adult Pneumonia vaccine.  **Flu shots are available--- please call and schedule a FLU-CLINIC appointment**  After your visit with [place] today you will receive a survey in the mail or online from [employer] regarding your care with [place]. Please take a moment to fill this out. Your feedback is very important to [place] as you can help [place] better understand your patient needs as well as improve your experience and satisfaction. WE CARE ABOUT YOU!!!   Continue with follow-up visit with urologist and ophthalmologist. Make every effort to exercise daily with walking especially during the winter months Drink plenty of fluids and stay well-hydrated Continue to check blood sugars periodically and bring these readings into the next visit Schedule eye exam visit since you say this is due about now. See podiatrist for nail trimming Come by and pick up a copy of your blood work to take to the urologist

## 2017-07-28 LAB — LIPID PANEL
CHOL/HDL RATIO: 4.1 ratio (ref 0.0–5.0)
CHOLESTEROL TOTAL: 120 mg/dL (ref 100–199)
HDL: 29 mg/dL — ABNORMAL LOW (ref >39)
LDL CALC: 70 mg/dL (ref 0–99)
Triglycerides: 103 mg/dL (ref 0–149)
VLDL Cholesterol Cal: 21 mg/dL (ref 5–40)

## 2017-07-28 LAB — CBC WITH DIFFERENTIAL/PLATELET
BASOS ABS: 0 10*3/uL (ref 0.0–0.2)
Basos: 1 %
EOS (ABSOLUTE): 0.2 10*3/uL (ref 0.0–0.4)
Eos: 3 %
HEMOGLOBIN: 11.8 g/dL — AB (ref 13.0–17.7)
Hematocrit: 35.8 % — ABNORMAL LOW (ref 37.5–51.0)
IMMATURE GRANS (ABS): 0 10*3/uL (ref 0.0–0.1)
Immature Granulocytes: 0 %
LYMPHS: 38 %
Lymphocytes Absolute: 2.4 10*3/uL (ref 0.7–3.1)
MCH: 29.6 pg (ref 26.6–33.0)
MCHC: 33 g/dL (ref 31.5–35.7)
MCV: 90 fL (ref 79–97)
MONOCYTES: 6 %
Monocytes Absolute: 0.4 10*3/uL (ref 0.1–0.9)
Neutrophils Absolute: 3.2 10*3/uL (ref 1.4–7.0)
Neutrophils: 52 %
Platelets: 246 10*3/uL (ref 150–379)
RBC: 3.98 x10E6/uL — AB (ref 4.14–5.80)
RDW: 14.7 % (ref 12.3–15.4)
WBC: 6.2 10*3/uL (ref 3.4–10.8)

## 2017-07-28 LAB — HEPATIC FUNCTION PANEL
ALBUMIN: 4.2 g/dL (ref 3.5–4.8)
ALT: 20 IU/L (ref 0–44)
AST: 26 IU/L (ref 0–40)
Alkaline Phosphatase: 90 IU/L (ref 39–117)
BILIRUBIN TOTAL: 0.4 mg/dL (ref 0.0–1.2)
Bilirubin, Direct: 0.14 mg/dL (ref 0.00–0.40)
Total Protein: 7.2 g/dL (ref 6.0–8.5)

## 2017-07-28 LAB — BMP8+EGFR
BUN/Creatinine Ratio: 11 (ref 10–24)
BUN: 14 mg/dL (ref 8–27)
CALCIUM: 9.2 mg/dL (ref 8.6–10.2)
CO2: 20 mmol/L (ref 20–29)
Chloride: 99 mmol/L (ref 96–106)
Creatinine, Ser: 1.32 mg/dL — ABNORMAL HIGH (ref 0.76–1.27)
GFR calc non Af Amer: 54 mL/min/{1.73_m2} — ABNORMAL LOW (ref >59)
GFR, EST AFRICAN AMERICAN: 62 mL/min/{1.73_m2} (ref >59)
Glucose: 112 mg/dL — ABNORMAL HIGH (ref 65–99)
Potassium: 3.9 mmol/L (ref 3.5–5.2)
Sodium: 140 mmol/L (ref 134–144)

## 2017-07-28 LAB — PSA, TOTAL AND FREE
PSA FREE PCT: 12.9 %
PSA, Free: 1.1 ng/mL
Prostate Specific Ag, Serum: 8.5 ng/mL — ABNORMAL HIGH (ref 0.0–4.0)

## 2017-07-28 LAB — THYROID PANEL WITH TSH
Free Thyroxine Index: 2.2 (ref 1.2–4.9)
T3 UPTAKE RATIO: 24 % (ref 24–39)
T4 TOTAL: 9.2 ug/dL (ref 4.5–12.0)
TSH: 1.3 u[IU]/mL (ref 0.450–4.500)

## 2017-07-31 ENCOUNTER — Ambulatory Visit: Payer: Medicare HMO | Admitting: Urology

## 2017-07-31 DIAGNOSIS — N401 Enlarged prostate with lower urinary tract symptoms: Secondary | ICD-10-CM | POA: Diagnosis not present

## 2017-07-31 DIAGNOSIS — R351 Nocturia: Secondary | ICD-10-CM | POA: Diagnosis not present

## 2017-07-31 DIAGNOSIS — C61 Malignant neoplasm of prostate: Secondary | ICD-10-CM

## 2017-07-31 DIAGNOSIS — N5201 Erectile dysfunction due to arterial insufficiency: Secondary | ICD-10-CM | POA: Diagnosis not present

## 2017-08-07 ENCOUNTER — Other Ambulatory Visit: Payer: Self-pay | Admitting: Urology

## 2017-08-07 DIAGNOSIS — C61 Malignant neoplasm of prostate: Secondary | ICD-10-CM

## 2017-08-10 ENCOUNTER — Encounter (HOSPITAL_COMMUNITY): Payer: Self-pay

## 2017-08-10 ENCOUNTER — Other Ambulatory Visit: Payer: Self-pay | Admitting: Urology

## 2017-08-10 ENCOUNTER — Encounter (HOSPITAL_COMMUNITY)
Admission: RE | Admit: 2017-08-10 | Discharge: 2017-08-10 | Disposition: A | Discharge status: Home / Self Care | Payer: Medicare HMO | Source: Ambulatory Visit | Attending: Urology | Admitting: Urology

## 2017-08-10 DIAGNOSIS — C61 Malignant neoplasm of prostate: Secondary | ICD-10-CM | POA: Insufficient documentation

## 2017-08-10 MED ORDER — TECHNETIUM TC 99M MEDRONATE IV KIT
20.0000 | PACK | Freq: Once | INTRAVENOUS | Status: AC | PRN
  Administered 2017-08-10: 21 via INTRAVENOUS

## 2017-08-11 ENCOUNTER — Other Ambulatory Visit (HOSPITAL_COMMUNITY): Payer: Medicare HMO

## 2017-08-11 DIAGNOSIS — M79676 Pain in unspecified toe(s): Secondary | ICD-10-CM | POA: Diagnosis not present

## 2017-08-11 DIAGNOSIS — B351 Tinea unguium: Secondary | ICD-10-CM | POA: Diagnosis not present

## 2017-08-11 DIAGNOSIS — E1151 Type 2 diabetes mellitus with diabetic peripheral angiopathy without gangrene: Secondary | ICD-10-CM | POA: Diagnosis not present

## 2017-08-11 DIAGNOSIS — L84 Corns and callosities: Secondary | ICD-10-CM | POA: Diagnosis not present

## 2017-08-26 ENCOUNTER — Ambulatory Visit (HOSPITAL_COMMUNITY)
Admission: RE | Admit: 2017-08-26 | Discharge: 2017-08-26 | Disposition: A | Discharge status: Home / Self Care | Payer: Medicare HMO | Source: Ambulatory Visit | Attending: Urology | Admitting: Urology

## 2017-08-26 DIAGNOSIS — I2510 Atherosclerotic heart disease of native coronary artery without angina pectoris: Secondary | ICD-10-CM | POA: Insufficient documentation

## 2017-08-26 DIAGNOSIS — C61 Malignant neoplasm of prostate: Secondary | ICD-10-CM | POA: Diagnosis not present

## 2017-08-26 DIAGNOSIS — N289 Disorder of kidney and ureter, unspecified: Secondary | ICD-10-CM | POA: Diagnosis not present

## 2017-08-26 DIAGNOSIS — I700 Atherosclerosis of aorta: Secondary | ICD-10-CM | POA: Insufficient documentation

## 2017-08-26 MED ORDER — IOPAMIDOL (ISOVUE-300) INJECTION 61%
100.0000 mL | Freq: Once | INTRAVENOUS | Status: AC | PRN
  Administered 2017-08-26: 100 mL via INTRAVENOUS

## 2017-08-28 DIAGNOSIS — H5200 Hypermetropia, unspecified eye: Secondary | ICD-10-CM | POA: Diagnosis not present

## 2017-08-28 DIAGNOSIS — H25813 Combined forms of age-related cataract, bilateral: Secondary | ICD-10-CM | POA: Diagnosis not present

## 2017-08-28 DIAGNOSIS — H40009 Preglaucoma, unspecified, unspecified eye: Secondary | ICD-10-CM | POA: Diagnosis not present

## 2017-08-28 DIAGNOSIS — E109 Type 1 diabetes mellitus without complications: Secondary | ICD-10-CM | POA: Diagnosis not present

## 2017-08-28 DIAGNOSIS — Z0100 Encounter for examination of eyes and vision without abnormal findings: Secondary | ICD-10-CM | POA: Diagnosis not present

## 2017-08-28 DIAGNOSIS — I10 Essential (primary) hypertension: Secondary | ICD-10-CM | POA: Diagnosis not present

## 2017-08-28 LAB — HM DIABETES EYE EXAM

## 2017-08-31 ENCOUNTER — Telehealth: Payer: Self-pay | Admitting: Family Medicine

## 2017-08-31 NOTE — Telephone Encounter (Signed)
Patient aware that samples placed at front for pick up. 

## 2017-09-02 ENCOUNTER — Ambulatory Visit: Payer: Medicare HMO | Admitting: Urology

## 2017-09-02 DIAGNOSIS — C61 Malignant neoplasm of prostate: Secondary | ICD-10-CM

## 2017-09-08 ENCOUNTER — Other Ambulatory Visit: Payer: Self-pay | Admitting: Family Medicine

## 2017-09-11 ENCOUNTER — Other Ambulatory Visit: Payer: Self-pay | Admitting: Family Medicine

## 2017-10-01 DIAGNOSIS — C61 Malignant neoplasm of prostate: Secondary | ICD-10-CM | POA: Diagnosis not present

## 2017-10-07 ENCOUNTER — Ambulatory Visit: Payer: Medicare HMO | Admitting: Urology

## 2017-10-07 DIAGNOSIS — C61 Malignant neoplasm of prostate: Secondary | ICD-10-CM

## 2017-10-08 ENCOUNTER — Telehealth: Payer: Self-pay | Admitting: Family Medicine

## 2017-10-08 NOTE — Telephone Encounter (Signed)
Pt aware, up front

## 2017-11-05 DIAGNOSIS — H40013 Open angle with borderline findings, low risk, bilateral: Secondary | ICD-10-CM | POA: Diagnosis not present

## 2017-11-05 DIAGNOSIS — H25813 Combined forms of age-related cataract, bilateral: Secondary | ICD-10-CM | POA: Diagnosis not present

## 2017-11-13 ENCOUNTER — Telehealth: Payer: Self-pay | Admitting: Family Medicine

## 2017-11-13 NOTE — Telephone Encounter (Signed)
Pt aware samples up front for pickup.

## 2017-11-16 ENCOUNTER — Other Ambulatory Visit: Payer: Self-pay | Admitting: Family Medicine

## 2017-11-17 NOTE — Telephone Encounter (Signed)
OV 12/01/17

## 2017-11-26 ENCOUNTER — Ambulatory Visit: Payer: Medicare HMO | Admitting: Family Medicine

## 2017-12-01 ENCOUNTER — Encounter: Payer: Self-pay | Admitting: Family Medicine

## 2017-12-01 ENCOUNTER — Ambulatory Visit (INDEPENDENT_AMBULATORY_CARE_PROVIDER_SITE_OTHER): Payer: Medicare HMO | Admitting: Family Medicine

## 2017-12-01 VITALS — BP 90/52 | HR 71 | Temp 98.2°F | Ht 68.0 in | Wt 216.0 lb

## 2017-12-01 DIAGNOSIS — I739 Peripheral vascular disease, unspecified: Secondary | ICD-10-CM | POA: Diagnosis not present

## 2017-12-01 DIAGNOSIS — C61 Malignant neoplasm of prostate: Secondary | ICD-10-CM | POA: Diagnosis not present

## 2017-12-01 DIAGNOSIS — E7849 Other hyperlipidemia: Secondary | ICD-10-CM | POA: Diagnosis not present

## 2017-12-01 DIAGNOSIS — E559 Vitamin D deficiency, unspecified: Secondary | ICD-10-CM

## 2017-12-01 DIAGNOSIS — E1165 Type 2 diabetes mellitus with hyperglycemia: Secondary | ICD-10-CM | POA: Diagnosis not present

## 2017-12-01 DIAGNOSIS — I10 Essential (primary) hypertension: Secondary | ICD-10-CM

## 2017-12-01 DIAGNOSIS — Z87438 Personal history of other diseases of male genital organs: Secondary | ICD-10-CM | POA: Diagnosis not present

## 2017-12-01 DIAGNOSIS — D508 Other iron deficiency anemias: Secondary | ICD-10-CM | POA: Diagnosis not present

## 2017-12-01 LAB — BAYER DCA HB A1C WAIVED: HB A1C: 6.3 % (ref <7.0)

## 2017-12-01 MED ORDER — TAMSULOSIN HCL 0.4 MG PO CAPS: 0.4000 mg | ORAL_CAPSULE | Freq: Every day | ORAL | 3 refills | Status: DC

## 2017-12-01 MED ORDER — BLOOD GLUCOSE METER KIT: PACK | 1 refills | Status: DC

## 2017-12-01 NOTE — Patient Instructions (Signed)
Medicare Annual Wellness Visit  [facility] and the medical providers at [facility] strive to bring you the best medical care.  In doing so we not only want to address your current medical conditions and concerns but also to detect new conditions early and prevent illness, disease and health-related problems.    Medicare offers a yearly Wellness Visit which allows our clinical staff to assess your need for preventative services including immunizations, lifestyle education, counseling to decrease risk of preventable diseases and screening for fall risk and other medical concerns.    This visit is provided free of charge (no copay) for all Medicare recipients. The clinical pharmacists at [facility] have begun to conduct these Wellness Visits which will also include a thorough review of all your medications.    As you primary medical provider recommend that you make an appointment for your Annual Wellness Visit if you have not done so already this year.  You may set up this appointment before you leave today or you may call back ([phone]) and schedule an appointment.  Please make sure when you call that you mention that you are scheduling your Annual Wellness Visit with the clinical pharmacist so that the appointment may be made for the proper length of time.     Continue current medications. Continue good therapeutic lifestyle changes which include good diet and exercise. Fall precautions discussed with patient. If an FOBT was given today- please return it to our front desk. If you are over 50 years old - you may need Prevnar 13 or the adult Pneumonia vaccine.  **Flu shots are available--- please call and schedule a FLU-CLINIC appointment**  After your visit with us today you will receive a survey in the mail or online from [employer] regarding your care with us. Please take a moment to fill this out. Your feedback is very  important to us as you can help us better understand your patient needs as well as improve your experience and satisfaction. WE CARE ABOUT YOU!!!    

## 2017-12-01 NOTE — Progress Notes (Signed)
Subjective:    Patient ID: Andrew [name], male    DOB: [birth year], 72 y.o.   MRN: [id number]  HPI Pt here for follow up and management of chronic medical problems which includes hyperlipidemia and diabetes. He is taking medication regularly.  The patient is doing well overall.  He will get lab work today.  He is also given a prescription for new blood sugar meter and a prescription for tamsulosin.  The patient's weight is 216.  His vital signs are stable.  He brings in blood sugars for review and overall these are very good and they will be scanned into the record.  The fasting blood sugars seem to run between 110 and 125.  During the day the sugars are about the same or lower.  The patient today does complain of grinding his teeth.  This patient has had prostate cancer and has had seed implantation.  This was back in 2008.  He is on atorvastatin vitamin D 3 iron replacement lisinopril and HCTZ Januvia and metformin and tamsulosin for his BPH.  The patient is pleasant and doing well other than complaints with the habit of grinding his teeth which he does not have a lot of.  He is followed regularly by Dr. Noah Delaine the urologist for his prostate cancer and is seen in [place] for this.  Today he denies any chest pain pressure tightness or shortness of breath anymore than usual.  He does have some reflux symptoms and heartburn but only takes his ranitidine if needed for this and I encouraged him to take it more regularly for the next couple of months and see if he can eliminate the heartburn altogether.  He denies any blood in the stool or black tarry bowel movements and says he is passing his water without problems.  He is also up-to-date on his eye exam and had one recently at my eye doctor either in late May or early June.    Patient Active Problem List   Diagnosis Date Noted  . SOB (shortness of breath) 12/10/2016  . Erectile dysfunction due to arterial insufficiency 10/30/2016  . Polyp of colon  10/30/2016  . Peripheral vascular insufficiency ([place]) 06/13/2016  . Vitamin D deficiency 08/24/2015  . Prostate cancer ([place]) 04/07/2014  . Diabetes mellitus type 2, uncontrolled ([place]) 09/14/2010  . BPH (benign prostatic hyperplasia) 09/14/2010  . HTN (hypertension) 09/14/2010  . Hyperlipidemia associated with type 2 diabetes mellitus ([place]) 09/14/2010  . Tobacco abuse 09/14/2010   Outpatient Encounter Medications as of 12/01/2017  Medication Sig  . ACCU-CHEK AVIVA PLUS test strip CHECK BLOOD SUGAR  UP  TO THREE TIMES DAILY  . aspirin 81 MG tablet Take 81 mg by mouth daily.  Marland Kitchen atorvastatin (LIPITOR) 40 MG tablet TAKE 1 TABLET EVERY DAY  . cholecalciferol (VITAMIN D) 1000 UNITS tablet Take 1,000 Units by mouth daily.   . Cinnamon 500 MG capsule Take 500 mg by mouth daily as needed (blood glucose levels).  . ferrous sulfate 325 (65 FE) MG tablet TAKE 1 TABLET BY MOUTH ONCE DAILY WITH BREAKFAST  . lisinopril-hydrochlorothiazide (PRINZIDE,ZESTORETIC) 20-25 MG tablet TAKE 1 TABLET EVERY DAY  . metFORMIN (GLUCOPHAGE) 1000 MG tablet TAKE 1 TABLET TWICE DAILY WITH A MEAL  . sitaGLIPtin (JANUVIA) 100 MG tablet Take 1 tablet (100 mg total) by mouth daily.  . tamsulosin (FLOMAX) 0.4 MG CAPS capsule TAKE 1 CAPSULE EVERY DAY  . [DISCONTINUED] amoxicillin-clavulanate (AUGMENTIN) 875-125 MG tablet Take 1 tablet by mouth 2 (two) times daily.  No facility-administered encounter medications on file as of 12/01/2017.      Review of Systems  Constitutional: Negative.   HENT: Negative.        Grinding teeth   Eyes: Negative.   Respiratory: Negative.   Cardiovascular: Negative.   Gastrointestinal: Negative.   Endocrine: Negative.   Genitourinary: Negative.   Musculoskeletal: Negative.   Skin: Negative.   Allergic/Immunologic: Negative.   Neurological: Negative.   Hematological: Negative.   Psychiatric/Behavioral: Negative.        Objective:   Physical Exam  Constitutional: He is oriented to  person, place, and time. He appears well-developed and well-nourished. No distress.  The patient is calm laid-back pleasant and alert.  HENT:  Head: Normocephalic and atraumatic.  Right Ear: External ear normal.  Left Ear: External ear normal.  Nose: Nose normal.  Mouth/Throat: No oropharyngeal exudate.  Poor oral hygiene.  Many teeth are absent.  Eyes: Pupils are equal, round, and reactive to light. Conjunctivae and EOM are normal. Right eye exhibits no discharge. Left eye exhibits no discharge. No scleral icterus.  Up-to-date on eye exams with recent exam being in late May early June at my eye doctor.  Neck: Normal range of motion. Neck supple. No thyromegaly present.  No bruits thyromegaly or anterior cervical adenopathy  Cardiovascular: Normal rate, regular rhythm, normal heart sounds and intact distal pulses.  No murmur heard. Heart has a regular rate and rhythm at 72/min with good pedal pulses  Pulmonary/Chest: Effort normal and breath sounds normal. He has no wheezes. He has no rales. He exhibits no tenderness.  Clear anteriorly and posteriorly and no axillary adenopathy chest wall masses or chest wall tenderness.  Abdominal: Soft. Bowel sounds are normal. He exhibits no mass. There is no tenderness. There is no rebound and no guarding.  Abdominal obesity without masses tenderness organ enlargement or bruits  Genitourinary:  Genitourinary Comments: The patient sees Dr. Alyson Ingles with [facility] every 4 to 6 months usually in [place].  Musculoskeletal: Normal range of motion. He exhibits no edema.  Lymphadenopathy:    He has no cervical adenopathy.  Neurological: He is alert and oriented to person, place, and time. He has normal reflexes. No cranial nerve deficit.  Skin: Skin is warm and dry. No rash noted.  Psychiatric: He has a normal mood and affect. His behavior is normal. Judgment and thought content normal.  Pleasant and alert  Nursing note and vitals reviewed.  BP  (!) 90/52 (BP Location: Left Arm)   Pulse 71   Temp 98.2 F (36.8 C) (Oral)   Ht '5\' 8"'  (1.727 m)   Wt 216 lb (98 kg)   BMI 32.84 kg/m         Assessment & Plan:  1. Uncontrolled type 2 diabetes mellitus with hyperglycemia ([place]) -Continue with current treatment pending results of lab work and follow-up as aggressive therapeutic lifestyle changes as possible - CBC with Differential/Platelet - BMP8+EGFR - Bayer DCA Hb A1c Waived - Microalbumin / creatinine urine ratio  2. Other hyperlipidemia -Continue current treatment of atorvastatin and as aggressive therapeutic lifestyle changes as possible - CBC with Differential/Platelet - Lipid panel  3. Other iron deficiency anemia -Continue with iron replacement pending results of lab work - CBC with Differential/Platelet  4. Essential hypertension -Blood pressure is good today and he will continue with current treatment - CBC with Differential/Platelet - BMP8+EGFR - Hepatic function panel  5. History of BPH -Continue regular follow-up with urology because of enlarged prostate but  also because of prostate cancer - CBC with Differential/Platelet  6. Vitamin D deficiency -Continue current treatment pending results of lab work - CBC with Differential/Platelet - VITAMIN D 25 Hydroxy (Vit-D Deficiency, Fractures)  7. Peripheral vascular insufficiency (HCC) -Pulses were present but weak bilaterally in lower extremities - CBC with Differential/Platelet  8.  Prostate cancer -Continue regular follow-up with urology, Dr. Alyson Ingles  Meds ordered this encounter  Medications  . blood glucose meter kit and supplies    Sig: Check BS up to four times daily as directed (dx.E11.9)    Dispense:  1 each    Refill:  1    Order Specific Question:   Number of strips    Answer:   100    Order Specific Question:   Number of lancets    Answer:   100  . tamsulosin (FLOMAX) 0.4 MG CAPS capsule    Sig: Take 1 capsule (0.4 mg total) by mouth  daily.    Dispense:  90 capsule    Refill:  3   Patient Instructions                       Medicare Annual Wellness Visit  [facility] and the medical providers at [facility] strive to bring you the best medical care.  In doing so we not only want to address your current medical conditions and concerns but also to detect new conditions early and prevent illness, disease and health-related problems.    Medicare offers a yearly Wellness Visit which allows our clinical staff to assess your need for preventative services including immunizations, lifestyle education, counseling to decrease risk of preventable diseases and screening for fall risk and other medical concerns.    This visit is provided free of charge (no copay) for all Medicare recipients. The clinical pharmacists at [facility] have begun to conduct these Wellness Visits which will also include a thorough review of all your medications.    As you primary medical provider recommend that you make an appointment for your Annual Wellness Visit if you have not done so already this year.  You may set up this appointment before you leave today or you may call back ([phone]) and schedule an appointment.  Please make sure when you call that you mention that you are scheduling your Annual Wellness Visit with the clinical pharmacist so that the appointment may be made for the proper length of time.     Continue current medications. Continue good therapeutic lifestyle changes which include good diet and exercise. Fall precautions discussed with patient. If an FOBT was given today- please return it to our front desk. If you are over 33 years old - you may need Prevnar 101 or the adult Pneumonia vaccine.  **Flu shots are available--- please call and schedule a FLU-CLINIC appointment**  After your visit with [place] today you will receive a survey in the mail or online from [employer] regarding your care  with [place]. Please take a moment to fill this out. Your feedback is very important to [place] as you can help [place] better understand your patient needs as well as improve your experience and satisfaction. WE CARE ABOUT YOU!!!     Arrie Senate MD

## 2017-12-02 ENCOUNTER — Other Ambulatory Visit: Payer: Self-pay

## 2017-12-02 LAB — BMP8+EGFR
BUN / CREAT RATIO: 15 (ref 10–24)
BUN: 17 mg/dL (ref 8–27)
CO2: 23 mmol/L (ref 20–29)
CREATININE: 1.16 mg/dL (ref 0.76–1.27)
Calcium: 9.1 mg/dL (ref 8.6–10.2)
Chloride: 101 mmol/L (ref 96–106)
GFR calc Af Amer: 72 mL/min/{1.73_m2} (ref >59)
GFR calc non Af Amer: 63 mL/min/{1.73_m2} (ref >59)
GLUCOSE: 114 mg/dL — AB (ref 65–99)
POTASSIUM: 4.2 mmol/L (ref 3.5–5.2)
Sodium: 141 mmol/L (ref 134–144)

## 2017-12-02 LAB — MICROALBUMIN / CREATININE URINE RATIO: Creatinine, Urine: 83.7 mg/dL

## 2017-12-02 LAB — HEPATIC FUNCTION PANEL
ALBUMIN: 4.2 g/dL (ref 3.5–4.8)
ALK PHOS: 99 IU/L (ref 39–117)
ALT: 22 IU/L (ref 0–44)
AST: 35 IU/L (ref 0–40)
BILIRUBIN TOTAL: 0.4 mg/dL (ref 0.0–1.2)
Bilirubin, Direct: 0.12 mg/dL (ref 0.00–0.40)
TOTAL PROTEIN: 7.1 g/dL (ref 6.0–8.5)

## 2017-12-02 LAB — CBC WITH DIFFERENTIAL/PLATELET
BASOS ABS: 0 10*3/uL (ref 0.0–0.2)
BASOS: 1 %
EOS (ABSOLUTE): 0.3 10*3/uL (ref 0.0–0.4)
Eos: 4 %
Hematocrit: 35.4 % — ABNORMAL LOW (ref 37.5–51.0)
Hemoglobin: 12 g/dL — ABNORMAL LOW (ref 13.0–17.7)
IMMATURE GRANS (ABS): 0 10*3/uL (ref 0.0–0.1)
IMMATURE GRANULOCYTES: 0 %
LYMPHS: 37 %
Lymphocytes Absolute: 2.7 10*3/uL (ref 0.7–3.1)
MCH: 30.2 pg (ref 26.6–33.0)
MCHC: 33.9 g/dL (ref 31.5–35.7)
MCV: 89 fL (ref 79–97)
MONOS ABS: 0.5 10*3/uL (ref 0.1–0.9)
Monocytes: 7 %
NEUTROS PCT: 51 %
Neutrophils Absolute: 3.7 10*3/uL (ref 1.4–7.0)
PLATELETS: 254 10*3/uL (ref 150–450)
RBC: 3.98 x10E6/uL — AB (ref 4.14–5.80)
RDW: 14.1 % (ref 12.3–15.4)
WBC: 7.3 10*3/uL (ref 3.4–10.8)

## 2017-12-02 LAB — LIPID PANEL
CHOL/HDL RATIO: 3.5 ratio (ref 0.0–5.0)
CHOLESTEROL TOTAL: 107 mg/dL (ref 100–199)
HDL: 31 mg/dL — ABNORMAL LOW (ref >39)
LDL CALC: 42 mg/dL (ref 0–99)
Triglycerides: 171 mg/dL — ABNORMAL HIGH (ref 0–149)
VLDL Cholesterol Cal: 34 mg/dL (ref 5–40)

## 2017-12-02 LAB — VITAMIN D 25 HYDROXY (VIT D DEFICIENCY, FRACTURES): Vit D, 25-Hydroxy: 33.6 ng/mL (ref 30.0–100.0)

## 2017-12-02 MED ORDER — ICOSAPENT ETHYL 1 G PO CAPS: 2.0000 g | ORAL_CAPSULE | Freq: Two times a day (BID) | ORAL | 5 refills | Status: DC

## 2017-12-03 ENCOUNTER — Telehealth: Payer: Self-pay | Admitting: Family Medicine

## 2017-12-03 NOTE — Telephone Encounter (Signed)
If insurance will not pay for this, just make sure that he takes an omega-3 fatty acid over-the-counter like Lovaza and at least 1 twice daily.

## 2017-12-04 NOTE — Telephone Encounter (Signed)
Patient is aware and verbalized his understanding. He will go to his  Pharmacy to pick up the Omega 3.

## 2017-12-07 ENCOUNTER — Telehealth: Payer: Self-pay | Admitting: Family Medicine

## 2017-12-07 NOTE — Telephone Encounter (Signed)
Pt aware to cont both

## 2017-12-07 NOTE — Telephone Encounter (Signed)
Patient states he started his new medication of Vascepa Friday and has still been taking his atorvastatin on and off since then.  Wanting to know if he needs to still continue to take his atorvastatin. Aware DWM is out of the office today. Please advise

## 2017-12-30 DIAGNOSIS — C61 Malignant neoplasm of prostate: Secondary | ICD-10-CM | POA: Diagnosis not present

## 2018-01-06 ENCOUNTER — Ambulatory Visit: Payer: Medicare HMO | Admitting: Urology

## 2018-01-06 DIAGNOSIS — C61 Malignant neoplasm of prostate: Secondary | ICD-10-CM

## 2018-01-06 DIAGNOSIS — N5201 Erectile dysfunction due to arterial insufficiency: Secondary | ICD-10-CM

## 2018-01-11 ENCOUNTER — Ambulatory Visit (INDEPENDENT_AMBULATORY_CARE_PROVIDER_SITE_OTHER): Payer: Medicare HMO | Admitting: *Deleted

## 2018-01-11 ENCOUNTER — Encounter: Payer: Self-pay | Admitting: *Deleted

## 2018-01-11 ENCOUNTER — Ambulatory Visit: Payer: Medicare HMO | Admitting: *Deleted

## 2018-01-11 VITALS — BP 94/62 | HR 84 | Ht 67.5 in | Wt 217.0 lb

## 2018-01-11 DIAGNOSIS — Z0000 Encounter for general adult medical examination without abnormal findings: Secondary | ICD-10-CM | POA: Diagnosis not present

## 2018-01-11 MED ORDER — BLOOD GLUCOSE METER KIT: PACK | 0 refills | Status: DC

## 2018-01-11 NOTE — Progress Notes (Addendum)
Subjective:   Andrew [name] is a 72 y.o. male who presents for a Medicare Annual Wellness Visit. Andrew [name] lives at home with his wife. He has one adult son and one daughter and 6 grandchildren. He is retired from [employer].   Review of Systems    Patient reports that his overall health is unchanged compared to last year.  Cardiac Risk Factors include: advanced age (>23mn, >>4women);dyslipidemia;diabetes mellitus;male gender;hypertension;smoking/ tobacco exposure;obesity (BMI >30kg/m2)   All other systems negative       Current Medications (verified) Outpatient Encounter Medications as of 01/11/2018  Medication Sig  . ACCU-CHEK AVIVA PLUS test strip CHECK BLOOD SUGAR  UP  TO THREE TIMES DAILY  . aspirin 81 MG tablet Take 81 mg by mouth daily.  .Marland Kitchenatorvastatin (LIPITOR) 40 MG tablet TAKE 1 TABLET EVERY DAY  . blood glucose meter kit and supplies Check BS up to four times daily as directed (dx.E11.9)  . cholecalciferol (VITAMIN D) 1000 UNITS tablet Take 1,000 Units by mouth daily.   . Cinnamon 500 MG capsule Take 500 mg by mouth daily as needed (blood glucose levels).  . ferrous sulfate 325 (65 FE) MG tablet TAKE 1 TABLET BY MOUTH ONCE DAILY WITH BREAKFAST  . Icosapent Ethyl (VASCEPA) 1 g CAPS Take 2 capsules (2 g total) by mouth 2 (two) times daily.  .Marland Kitchenlisinopril-hydrochlorothiazide (PRINZIDE,ZESTORETIC) 20-25 MG tablet TAKE 1 TABLET EVERY DAY  . metFORMIN (GLUCOPHAGE) 1000 MG tablet TAKE 1 TABLET TWICE DAILY WITH A MEAL  . sitaGLIPtin (JANUVIA) 100 MG tablet Take 1 tablet (100 mg total) by mouth daily.  . tadalafil (ADCIRCA/CIALIS) 20 MG tablet   . tamsulosin (FLOMAX) 0.4 MG CAPS capsule Take 1 capsule (0.4 mg total) by mouth daily.   No facility-administered encounter medications on file as of 01/11/2018.     Allergies (verified) Patient has no known allergies.   History: Past Medical History:  Diagnosis Date  . Anemia   . BPH (benign prostatic hyperplasia)    Sees Dr  JMichela Pitcher . Cataract   . Diabetes mellitus without complication ([place]   . Hyperlipidemia   . Hypertension   . Low serum vitamin D   . Prostate cancer ([place]    w/seed implantation and radiation   Past Surgical History:  Procedure Laterality Date  . COLONOSCOPY  2015  . PROSTATE SURGERY  2008   seed implant   Family History  Problem Relation Age of Onset  . Diabetes Mother   . Heart disease Mother   . Kidney disease Mother        DIALYSIS  . Emphysema Father   . Deep vein thrombosis Sister   . Kidney disease Sister   . Cancer Sister        lung  . Colon polyps Brother   . Hypertension Brother   . Gout Brother   . Colon cancer Neg Hx   . Esophageal cancer Neg Hx   . Rectal cancer Neg Hx   . Prostate cancer Neg Hx    Social History   Socioeconomic History  . Marital status: Married    Spouse name: Not on file  . Number of children: 2  . Years of education: 122 . Highest education level: 12th grade  Occupational History  . Occupation: [name]    Comment: Retired yGeologist, engineering Social Needs  . Financial resource strain: Not hard at all  . Food insecurity:    Worry: Never true  Inability: Never true  . Transportation needs:    Medical: No    Non-medical: No  Tobacco Use  . Smoking status: Current Every Day Smoker    Packs/day: 1.00    Years: 55.00    Pack years: 55.00    Types: Cigarettes    Start date: 06/30/1965  . Smokeless tobacco: Never Used  . Tobacco comment: Has tried nicotine replacement gum  Substance and Sexual Activity  . Alcohol use: No  . Drug use: No  . Sexual activity: Yes  Lifestyle  . Physical activity:    Days per week: 3 days    Minutes per session: 60 min  . Stress: Not at all  Relationships  . Social connections:    Talks on phone: More than three times a week    Gets together: More than three times a week    Attends religious service: More than 4 times per year    Active member of club or organization: Yes    Attends  meetings of clubs or organizations: More than 4 times per year    Relationship status: Married  Other Topics Concern  . Not on file  Social History Narrative   Lives at home with wife.      Tobacco Use Yes.  Smoking cessation offered? Yes  Clinical Intake:     Pain : No/denies pain                 Activities of Daily Living In your present state of health, do you have any difficulty performing the following activities: 01/11/2018  Hearing? N  Vision? N  Comment yearly eye exams  Difficulty concentrating or making decisions? N  Walking or climbing stairs? N  Dressing or bathing? N  Doing errands, shopping? N  Preparing Food and eating ? N  Using the Toilet? N  In the past six months, have you accidently leaked urine? N  Do you have problems with loss of bowel control? N  Managing your Medications? N  Managing your Finances? N  Housekeeping or managing your Housekeeping? N  Some recent data might be hidden     Diet 3 meals a day. Mostly home cooked meals but does eat out on occasion.   Exercise Current Exercise Habits: Structured exercise class, Type of exercise: walking;stretching;strength training/weights, Time (Minutes): 60, Frequency (Times/Week): 3, Weekly Exercise (Minutes/Week): 180, Intensity: Moderate, Exercise limited by: None identified    Depression Screen PHQ 2/9 Scores 01/11/2018 12/01/2017 07/27/2017 03/10/2017  PHQ - 2 Score 0 0 0 0  PHQ- 9 Score - - - -     Fall Risk Fall Risk  01/11/2018 12/01/2017 07/27/2017 03/10/2017 01/05/2017  Falls in the past year? No No No No No    Safety Is the patient's home free of loose throw rugs in walkways, pet beds, electrical cords, etc?   yes      Grab bars in the bathroom? yes      Walkin shower? no      Shower Seat? no      Handrails on the stairs?   yes      Adequate lighting?   yes  Patient Care Team: Chipper Herb, MD as PCP - General (Family Medicine) Minus Breeding, MD as Consulting Physician  (Cardiology) Alyson Ingles Candee Furbish, MD as Consulting Physician (Urology) Alyson Ingles Candee Furbish, MD as Consulting Physician (Urology)  No hospitalizations, ER visits, or surgeries this past year.  Objective:    Today's Vitals   01/11/18 1018  BP: 94/62  Pulse: 84  Weight: 217 lb (98.4 kg)  Height: 5' 7.5" (1.715 m)   Body mass index is 33.49 kg/m.  Advanced Directives 01/11/2018 07/02/2017 01/05/2017 09/08/2014 04/18/2014  Does Patient Have a Medical Advance Directive? No No No No No  Would patient like information on creating a medical advance directive? Yes (MAU/Ambulatory/Procedural Areas - Information given) - Yes (MAU/Ambulatory/Procedural Areas - Information given) No - patient declined information No - patient declined information    Hearing/Vision  normal or No deficits noted during visit.   Cognitive Function: MMSE - Mini Mental State Exam 01/11/2018 01/05/2017  Orientation to time 5 4  Orientation to Place 4 5  Registration 3 3  Attention/ Calculation 5 3  Recall 3 3  Language- name 2 objects 2 2  Language- repeat 1 1  Language- follow 3 step command 3 3  Language- read & follow direction 1 1  Write a sentence 1 1  Copy design 1 1  Total score 29 27       Normal Cognitive Function Screening: Yes    Immunizations and Health Maintenance Immunization History  Administered Date(s) Administered  . Influenza, High Dose Seasonal PF 05/13/2016, 04/14/2017  . Influenza,inj,Quad PF,6+ Mos 04/18/2013, 04/07/2014, 04/06/2015  . Pneumococcal Conjugate-13 02/27/2014  . Pneumococcal Polysaccharide-23 11/16/2012   Health Maintenance Due  Topic Date Due  . COLON CANCER SCREENING ANNUAL FOBT  12/08/2017   Health Maintenance  Topic Date Due  . COLON CANCER SCREENING ANNUAL FOBT  12/08/2017  . TETANUS/TDAP  12/02/2018 (Originally 10/26/1964)  . Hepatitis C Screening  12/02/2018 (Originally [birth year])  . INFLUENZA VACCINE  01/28/2018  . HEMOGLOBIN A1C  06/02/2018  .  OPHTHALMOLOGY EXAM  08/29/2018  . FOOT EXAM  12/02/2018  . COLONOSCOPY  09/12/2019  . PNA vac Low Risk Adult  Completed        Assessment:   This is a routine wellness examination for Jahon.      Plan:    Goals    . Exercise 3x per week (30 min per time)     Continue with exercising 3 times a week for at least 30 min each time        Health Maintenance Recommendations: Td vaccine Smoking cessation counseling Advanced directives: Information given and explained  Additional Screening Recommendations: Lung: Low Dose CT Chest recommended if Age 109-80 years, 30 pack-year currently smoking OR have quit w/in 15years. Patient does qualify. Hepatitis C Screening recommended: yes-with next labs  Today's Orders Home blood glucose meter-printed and will be sent to [facility] per patient's request  Keep f/u with Chipper Herb, MD and any other specialty appointments you may have Continue current medications Move carefully to avoid falls. Continue to stay active. Aim for at least 150 minutes of moderate activity a week.  Reading or puzzles are a good way to exercise your brain Stay connected with friends and family. Social connections are beneficial to your emotional and mental health.   I have personally reviewed and noted the following in the patient's chart:   . Medical and social history . Use of alcohol, tobacco or illicit drugs  . Current medications and supplements . Functional ability and status . Nutritional status . Physical activity . Advanced directives . List of other physicians . Hospitalizations, surgeries, and ER visits in previous 12 months . Vitals . Screenings to include cognitive, depression, and falls . Referrals and appointments  In addition, I have reviewed and discussed with patient certain preventive  protocols, quality metrics, and best practice recommendations. A written personalized care plan for preventive services as well as general  preventive health recommendations were provided to patient.     Chong Sicilian, RN   01/11/2018    I have reviewed and agree with the above AWV documentation.   Laroy Apple, MD [facility] 01/12/2018, 4:32 PM

## 2018-01-11 NOTE — Patient Instructions (Addendum)
  Andrew [name] , Thank you for taking time to come for your Medicare Wellness Visit. I appreciate your ongoing commitment to your health goals. Please review the following plan we discussed and let me know if I can assist you in the future.   These are the goals we discussed: Goals    . Exercise 3x per week (30 min per time)     Continue with exercising 3 times a week for at least 30 min each time       This is a list of the screening recommended for you and due dates:  Health Maintenance  Topic Date Due  . Stool Blood Test  12/08/2017  . Tetanus Vaccine  12/02/2018*  .  Hepatitis C: One time screening is recommended by Center for Disease Control  (CDC) for  adults born from [birth year] through 1965.   12/02/2018*  . Flu Shot  01/28/2018  . Hemoglobin A1C  06/02/2018  . Eye exam for diabetics  08/29/2018  . Complete foot exam   12/02/2018  . Colon Cancer Screening  09/12/2019  . Pneumonia vaccines  Completed  *Topic was postponed. The date shown is not the original due date.   Keep up the great work!

## 2018-01-19 ENCOUNTER — Telehealth: Payer: Self-pay | Admitting: Family Medicine

## 2018-01-19 NOTE — Telephone Encounter (Signed)
No samples available, patient informed 

## 2018-01-25 ENCOUNTER — Other Ambulatory Visit: Payer: Self-pay | Admitting: Family Medicine

## 2018-01-25 NOTE — Telephone Encounter (Signed)
Left detailed message that we are currently out of requested samples and to call back at a later time to check on samples.

## 2018-01-28 ENCOUNTER — Telehealth: Payer: Self-pay | Admitting: Family Medicine

## 2018-01-28 NOTE — Telephone Encounter (Signed)
Pt's wife aware we don't have any Januvia samples.

## 2018-02-02 ENCOUNTER — Telehealth: Payer: Self-pay | Admitting: Family Medicine

## 2018-02-03 NOTE — Telephone Encounter (Signed)
Patient aware we are currently still out of samples.

## 2018-02-05 ENCOUNTER — Other Ambulatory Visit: Payer: Self-pay | Admitting: *Deleted

## 2018-02-05 ENCOUNTER — Telehealth: Payer: Self-pay | Admitting: Family Medicine

## 2018-02-05 MED ORDER — SITAGLIPTIN PHOSPHATE 100 MG PO TABS: 100.0000 mg | ORAL_TABLET | Freq: Every day | ORAL | 2 refills | Status: DC

## 2018-02-05 NOTE — Telephone Encounter (Signed)
No samples available RX sent to pharmacy [facility] per Dr Laurance Flatten

## 2018-02-08 NOTE — Telephone Encounter (Signed)
Pt is wanting to know if we have any samples of sitaGLIPtin (JANUVIA) 100 MG tablet

## 2018-02-08 NOTE — Telephone Encounter (Signed)
Pt notified no samples available 

## 2018-02-09 DIAGNOSIS — E1151 Type 2 diabetes mellitus with diabetic peripheral angiopathy without gangrene: Secondary | ICD-10-CM | POA: Diagnosis not present

## 2018-02-09 DIAGNOSIS — B351 Tinea unguium: Secondary | ICD-10-CM | POA: Diagnosis not present

## 2018-02-09 DIAGNOSIS — L84 Corns and callosities: Secondary | ICD-10-CM | POA: Diagnosis not present

## 2018-02-09 DIAGNOSIS — M79676 Pain in unspecified toe(s): Secondary | ICD-10-CM | POA: Diagnosis not present

## 2018-02-15 ENCOUNTER — Telehealth: Payer: Self-pay | Admitting: Family Medicine

## 2018-02-15 NOTE — Telephone Encounter (Signed)
No samples available, patient aware 

## 2018-02-23 NOTE — Telephone Encounter (Signed)
Pt aware - still no samples available

## 2018-03-13 IMAGING — DX DG CHEST 2V
2 series · 2 of 2 positions shown · non-contrast
Comparison: 10/30/2016

CLINICAL DATA: Patient states no chest complaints. States he stood
up during the night and felt lightheaded. History of diabetes,
hypertension, and current smoker.

EXAM:
CHEST  2 VIEW

[chest lat]
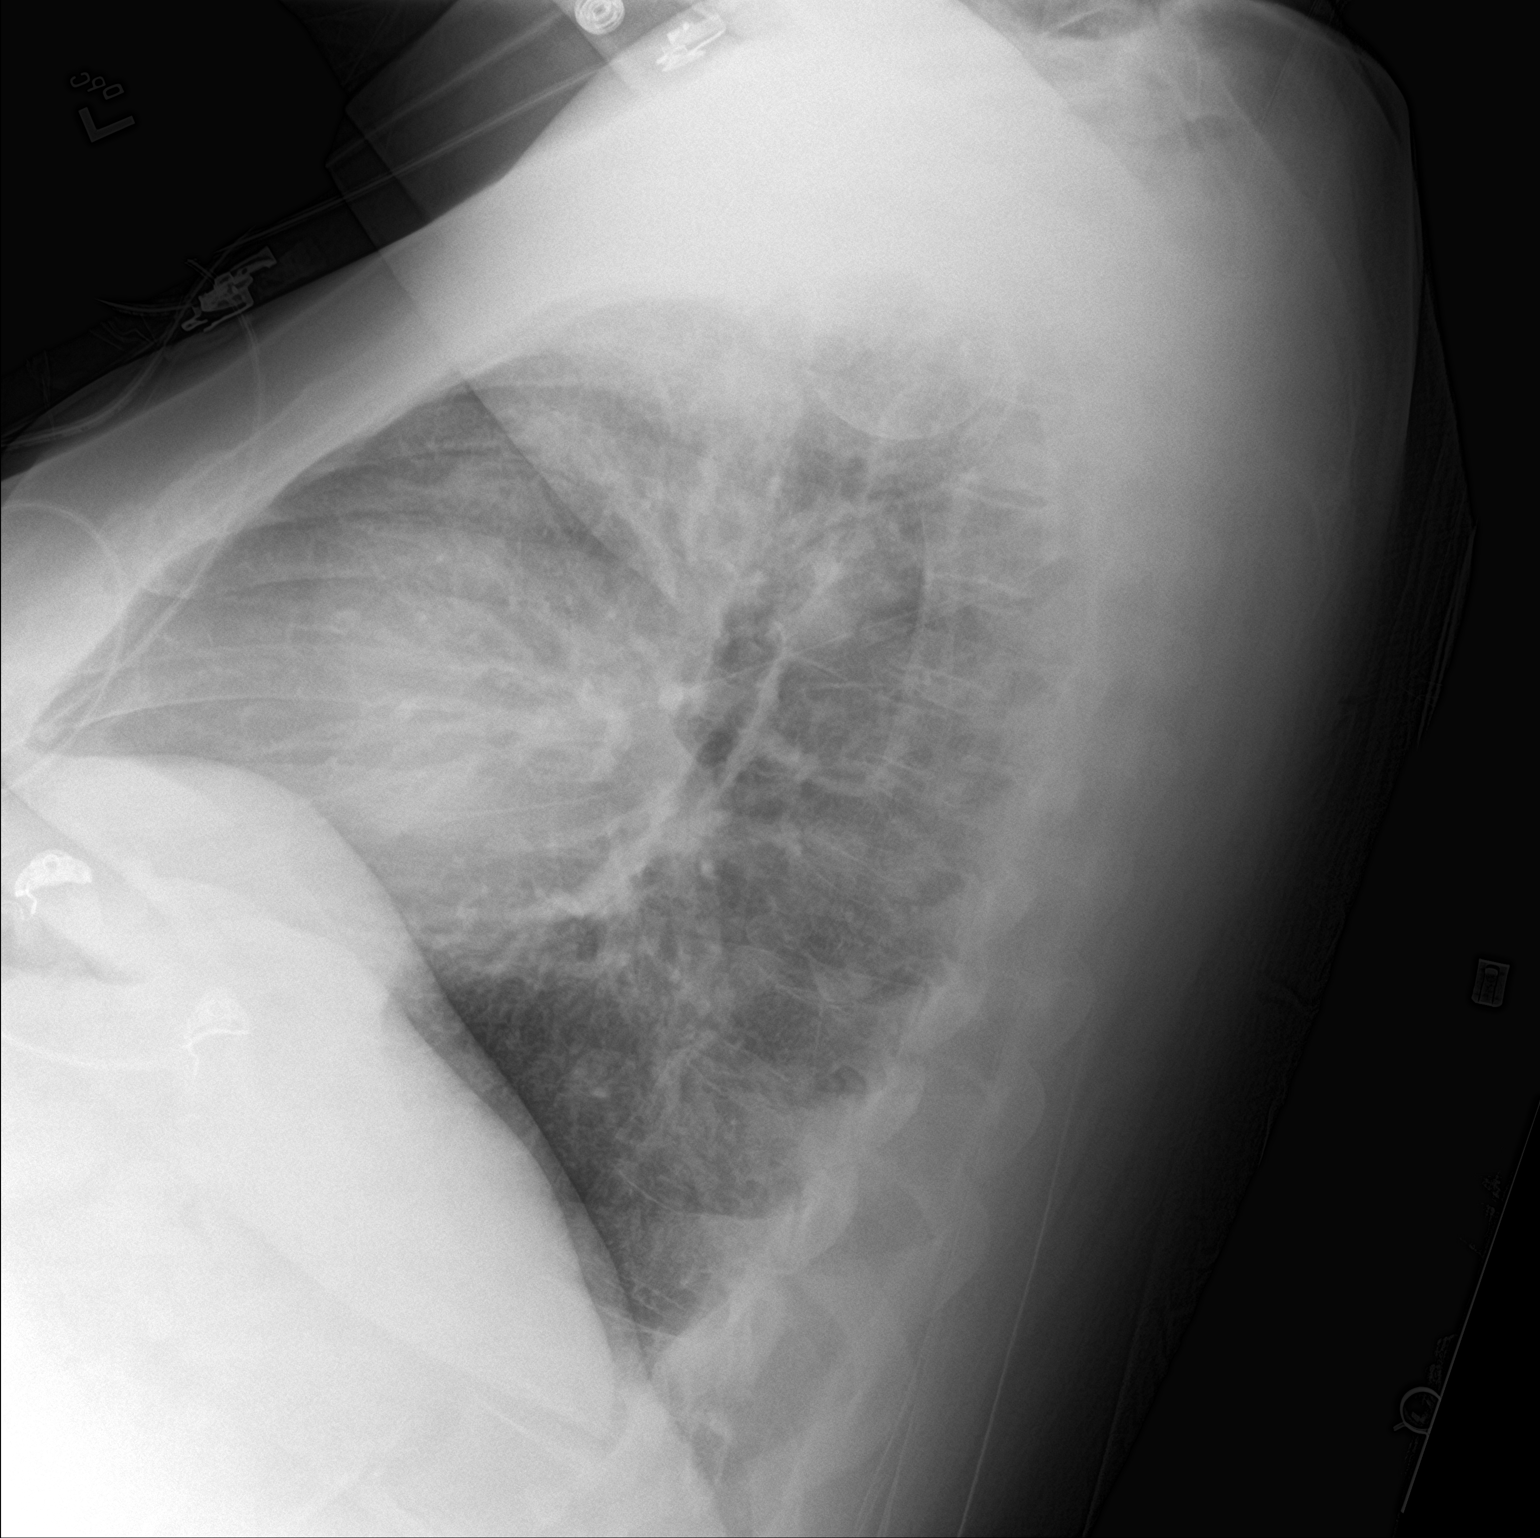

[chest ap]
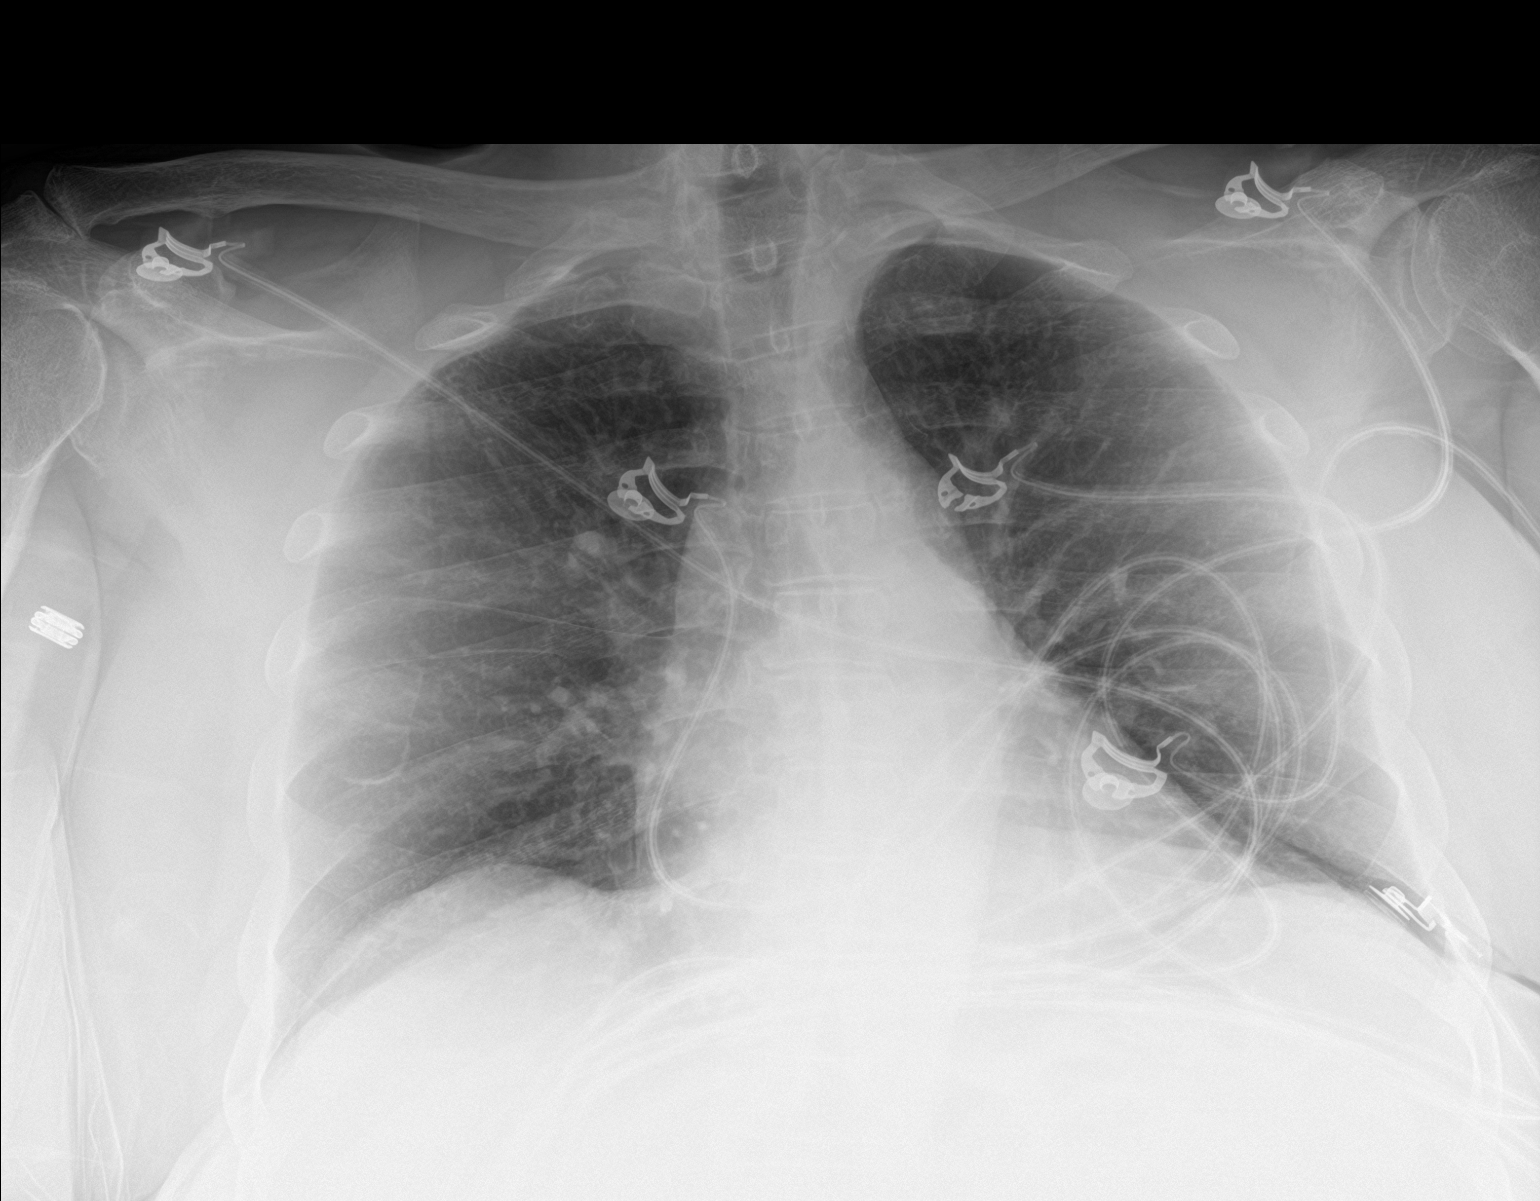

[2 of 2 positions shown; findings below may reference images not displayed]

FINDINGS: The heart size and mediastinal contours are within normal limits.
Both lungs are clear. The visualized skeletal structures are
unremarkable.
IMPRESSION: No active cardiopulmonary disease.

## 2018-03-17 ENCOUNTER — Telehealth: Payer: Self-pay | Admitting: Family Medicine

## 2018-03-17 NOTE — Telephone Encounter (Signed)
Samples placed at front for pick up.  Patient aware.

## 2018-03-31 ENCOUNTER — Other Ambulatory Visit: Payer: Self-pay | Admitting: Family Medicine

## 2018-04-06 ENCOUNTER — Ambulatory Visit (INDEPENDENT_AMBULATORY_CARE_PROVIDER_SITE_OTHER): Payer: Medicare HMO | Admitting: Family Medicine

## 2018-04-06 ENCOUNTER — Encounter: Payer: Self-pay | Admitting: Family Medicine

## 2018-04-06 VITALS — BP 119/71 | HR 68 | Temp 98.5°F | Ht 67.5 in | Wt 218.0 lb

## 2018-04-06 DIAGNOSIS — Z23 Encounter for immunization: Secondary | ICD-10-CM | POA: Diagnosis not present

## 2018-04-06 DIAGNOSIS — D508 Other iron deficiency anemias: Secondary | ICD-10-CM | POA: Diagnosis not present

## 2018-04-06 DIAGNOSIS — E1165 Type 2 diabetes mellitus with hyperglycemia: Secondary | ICD-10-CM

## 2018-04-06 DIAGNOSIS — I10 Essential (primary) hypertension: Secondary | ICD-10-CM | POA: Diagnosis not present

## 2018-04-06 DIAGNOSIS — E7849 Other hyperlipidemia: Secondary | ICD-10-CM

## 2018-04-06 DIAGNOSIS — E559 Vitamin D deficiency, unspecified: Secondary | ICD-10-CM

## 2018-04-06 DIAGNOSIS — E1169 Type 2 diabetes mellitus with other specified complication: Secondary | ICD-10-CM | POA: Diagnosis not present

## 2018-04-06 DIAGNOSIS — C61 Malignant neoplasm of prostate: Secondary | ICD-10-CM | POA: Diagnosis not present

## 2018-04-06 DIAGNOSIS — Z87438 Personal history of other diseases of male genital organs: Secondary | ICD-10-CM

## 2018-04-06 DIAGNOSIS — E785 Hyperlipidemia, unspecified: Secondary | ICD-10-CM

## 2018-04-06 LAB — BAYER DCA HB A1C WAIVED: HB A1C: 6.5 % (ref <7.0)

## 2018-04-06 NOTE — Progress Notes (Signed)
Subjective:    Patient ID: Andrew [name], male    DOB: 02/17/1946, 71 y.o.   MRN: [id number]  HPI Pt here for follow up and management of chronic medical problems which includes hypertension and Diabetes. He is taking medication regularly.  Andrew [name] is doing well overall and has no specific complaints today he does bring in some random blood sugars from the past 3 to 4 months and the majority of these all appear to be good with fasting ranges in the low 100s and during the day as high as 130.  These will be scanned into his record.  This patient has had prostate cancer and has a's seed implant for this.  He is on atorvastatin iron replacement blood pressure medicine metformin along with Januvia.  He takes Cialis and Flomax for his BPH.  Patient is pleasant and calm and soft-spoken with no complaints.  The patient today denies any chest pain or shortness of breath.  He is not as active as he should be.  He understands that he needs to increase his activity level.  He denies any trouble with his stomach including nausea vomiting diarrhea blood in the stool or change in bowel habits.  He is passing his water well but just frequently.  He does see the urologist, Dr. Alyson [name] every 4 to 6 months because of his prostate cancer.  He also gets his eye exams done yearly at [facility] eye [facility] and he does report glaucoma.     Patient Active Problem List   Diagnosis Date Noted  . SOB (shortness of breath) 12/10/2016  . Erectile dysfunction due to arterial insufficiency 10/30/2016  . Polyp of colon 10/30/2016  . Peripheral vascular insufficiency ([place]) 06/13/2016  . Vitamin D deficiency 08/24/2015  . Prostate cancer ([place]) 04/07/2014  . Diabetes mellitus type 2, uncontrolled ([place]) 09/14/2010  . BPH (benign prostatic hyperplasia) 09/14/2010  . HTN (hypertension) 09/14/2010  . Hyperlipidemia associated with type 2 diabetes mellitus ([place]) 09/14/2010  . Tobacco abuse 09/14/2010   Outpatient Encounter Medications as of  04/06/2018  Medication Sig  . ACCU-CHEK AVIVA PLUS test strip CHECK BLOOD SUGAR  UP  TO THREE TIMES DAILY  . aspirin 81 MG tablet Take 81 mg by mouth daily.  Marland Kitchen atorvastatin (LIPITOR) 40 MG tablet TAKE 1 TABLET EVERY DAY  . blood glucose meter kit and supplies Dispense based on patient and insurance preference. Use up to four times daily as directed. (FOR ICD-10 E10.9, E11.9).  . cholecalciferol (VITAMIN D) 1000 UNITS tablet Take 1,000 Units by mouth daily.   . Cinnamon 500 MG capsule Take 500 mg by mouth daily as needed (blood glucose levels).  . ferrous sulfate 325 (65 FE) MG tablet TAKE 1 TABLET BY MOUTH ONCE DAILY WITH BREAKFAST  . lisinopril-hydrochlorothiazide (PRINZIDE,ZESTORETIC) 20-25 MG tablet TAKE 1 TABLET EVERY DAY  . metFORMIN (GLUCOPHAGE) 1000 MG tablet TAKE 1 TABLET TWICE DAILY WITH A MEAL  . sitaGLIPtin (JANUVIA) 100 MG tablet Take 1 tablet (100 mg total) by mouth daily.  . tadalafil (ADCIRCA/CIALIS) 20 MG tablet   . tamsulosin (FLOMAX) 0.4 MG CAPS capsule Take 1 capsule (0.4 mg total) by mouth daily.  . [DISCONTINUED] Icosapent Ethyl (VASCEPA) 1 g CAPS Take 2 capsules (2 g total) by mouth 2 (two) times daily.   No facility-administered encounter medications on file as of 04/06/2018.      Review of Systems  Constitutional: Negative.   HENT: Negative.   Eyes: Negative.   Respiratory: Negative.   Cardiovascular:  Negative.   Gastrointestinal: Negative.   Endocrine: Negative.   Genitourinary: Negative.   Musculoskeletal: Negative.   Skin: Negative.   Allergic/Immunologic: Negative.   Neurological: Negative.   Hematological: Negative.   Psychiatric/Behavioral: Negative.        Objective:   Physical Exam  Constitutional: He is oriented to person, place, and time. He appears well-developed and well-nourished. No distress.  The patient is pleasant and alert.  HENT:  Head: Normocephalic and atraumatic.  Right Ear: External ear normal.  Left Ear: External ear normal.    Nose: Nose normal.  Mouth/Throat: Oropharynx is clear and moist. No oropharyngeal exudate.  Eyes: Pupils are equal, round, and reactive to light. Conjunctivae and EOM are normal. Right eye exhibits no discharge. Left eye exhibits no discharge. No scleral icterus.  He gets his eye exams yearly at [facility].  Neck: Normal range of motion. Neck supple. No thyromegaly present.  No bruits thyromegaly or anterior cervical adenopathy  Cardiovascular: Normal rate, regular rhythm, normal heart sounds and intact distal pulses.  No murmur heard. The heart is regular at 72/min with good pedal pulses and no murmurs  Pulmonary/Chest: Effort normal and breath sounds normal. He has no wheezes. He has no rales. He exhibits no tenderness.  Clear anteriorly and posteriorly with no axillary adenopathy or chest wall masses.  Abdominal: Soft. Bowel sounds are normal. He exhibits no mass. There is no tenderness.  Abdominal obesity without liver or spleen enlargement epigastric tenderness masses bruits or inguinal adenopathy  Musculoskeletal: Normal range of motion. He exhibits no edema or tenderness.  Lymphadenopathy:    He has no cervical adenopathy.  Neurological: He is alert and oriented to person, place, and time. He has normal reflexes. No cranial nerve deficit.  Reflexes are 2+ and equal bilaterally  Skin: Skin is warm and dry. No rash noted.  No suspicious rashes or skin lesions noted.  Psychiatric: He has a normal mood and affect. His behavior is normal. Judgment and thought content normal.  The patient's mood affect and behavior are normal for him and very laid back.  Nursing note and vitals reviewed.   BP 119/71 (BP Location: Left Arm)   Pulse 68   Temp 98.5 F (36.9 C) (Oral)   Ht 5' 7.5" (1.715 m)   Wt 218 lb (98.9 kg)   BMI 33.64 kg/m        Assessment & Plan:  1. Uncontrolled type 2 diabetes mellitus with hyperglycemia ([place]) -Continue with current treatment pending results of lab  work - CBC with Differential/Platelet - Bayer [place] Hb A1c Waived  2. Other iron deficiency anemia -Continue with iron replacement pending results of lab work - CBC with Differential/Platelet  3. Other hyperlipidemia -Continue with atorvastatin - CBC with Differential/Platelet - Lipid panel  4. Essential hypertension -The blood pressure is good today he will continue with current treatment - BMP8+EGFR - CBC with Differential/Platelet - Hepatic function panel  5. History of BPH -Follow-up with Dr. Alyson [name] as planned - CBC with Differential/Platelet  6. Vitamin D deficiency -Continue with vitamin D replacement pending results of lab work - CBC with Differential/Platelet - VITAMIN D 25 Hydroxy (Vit-D Deficiency, Fractures)  7. Prostate cancer [facility]) -Follow-up with Dr. Alyson [name] as planned  8. Hyperlipidemia associated with type 2 diabetes mellitus ([place]) -Continue with atorvastatin and omega-3 fatty acids and with as aggressive therapeutic lifestyle changes as possible pending results of lab work Patient Instructions  Medicare Annual Wellness Visit  [facility] and the medical providers at [facility] strive to bring you the best medical care.  In doing so we not only want to address your current medical conditions and concerns but also to detect new conditions early and prevent illness, disease and health-related problems.    Medicare offers a yearly Wellness Visit which allows our clinical staff to assess your need for preventative services including immunizations, lifestyle education, counseling to decrease risk of preventable diseases and screening for fall risk and other medical concerns.    This visit is provided free of charge (no copay) for all Medicare recipients. The clinical pharmacists at [facility] have begun to conduct these Wellness Visits which will also include a thorough review of all your  medications.    As you primary medical provider recommend that you make an appointment for your Annual Wellness Visit if you have not done so already this year.  You may set up this appointment before you leave today or you may call back ([phone]) and schedule an appointment.  Please make sure when you call that you mention that you are scheduling your Annual Wellness Visit with the clinical pharmacist so that the appointment may be made for the proper length of time.     Continue current medications. Continue good therapeutic lifestyle changes which include good diet and exercise. Fall precautions discussed with patient. If an FOBT was given today- please return it to our front desk. If you are over 48 years old - you may need Prevnar 66 or the adult Pneumonia vaccine.  **Flu shots are available--- please call and schedule a FLU-CLINIC appointment**  After your visit with [place] today you will receive a survey in the mail or online from [employer] regarding your care with [place]. Please take a moment to fill this out. Your feedback is very important to [place] as you can help [place] better understand your patient needs as well as improve your experience and satisfaction. WE CARE ABOUT YOU!!!   Try to drink more water Use Stevia instead of sugar to sweeten the coffee Trying to get more exercise.  Walking is the best. Continue to follow-up with the urologist. The flu shot you received today may make your arm sore. Continue with your omega-3 fatty acids.  The vascepa better than over-the-counter fish oil.   Arrie Senate MD

## 2018-04-06 NOTE — Patient Instructions (Addendum)
Medicare Annual Wellness Visit  [facility] and the medical providers at [place] strive to bring you the best medical care.  In doing so we not only want to address your current medical conditions and concerns but also to detect new conditions early and prevent illness, disease and health-related problems.    Medicare offers a yearly Wellness Visit which allows our clinical staff to assess your need for preventative services including immunizations, lifestyle education, counseling to decrease risk of preventable diseases and screening for fall risk and other medical concerns.    This visit is provided free of charge (no copay) for all Medicare recipients. The clinical pharmacists at [facility] have begun to conduct these Wellness Visits which will also include a thorough review of all your medications.    As you primary medical provider recommend that you make an appointment for your Annual Wellness Visit if you have not done so already this year.  You may set up this appointment before you leave today or you may call back ([phone]) and schedule an appointment.  Please make sure when you call that you mention that you are scheduling your Annual Wellness Visit with the clinical pharmacist so that the appointment may be made for the proper length of time.     Continue current medications. Continue good therapeutic lifestyle changes which include good diet and exercise. Fall precautions discussed with patient. If an FOBT was given today- please return it to our front desk. If you are over 52 years old - you may need Prevnar 78 or the adult Pneumonia vaccine.  **Flu shots are available--- please call and schedule a FLU-CLINIC appointment**  After your visit with [place] today you will receive a survey in the mail or online from [employer] regarding your care with [place]. Please take a moment to fill this out. Your feedback is very  important to [place] as you can help [place] better understand your patient needs as well as improve your experience and satisfaction. WE CARE ABOUT YOU!!!   Try to drink more water Use Stevia instead of sugar to sweeten the coffee Trying to get more exercise.  Walking is the best. Continue to follow-up with the urologist. The flu shot you received today may make your arm sore. Continue with your omega-3 fatty acids.  The vascepa better than over-the-counter fish oil.

## 2018-04-07 ENCOUNTER — Telehealth: Payer: Self-pay | Admitting: Family Medicine

## 2018-04-07 LAB — VITAMIN D 25 HYDROXY (VIT D DEFICIENCY, FRACTURES): Vit D, 25-Hydroxy: 38.5 ng/mL (ref 30.0–100.0)

## 2018-04-07 LAB — BMP8+EGFR
BUN/Creatinine Ratio: 12 (ref 10–24)
BUN: 15 mg/dL (ref 8–27)
CALCIUM: 9 mg/dL (ref 8.6–10.2)
CO2: 21 mmol/L (ref 20–29)
CREATININE: 1.25 mg/dL (ref 0.76–1.27)
Chloride: 101 mmol/L (ref 96–106)
GFR calc Af Amer: 66 mL/min/{1.73_m2} (ref >59)
GFR calc non Af Amer: 57 mL/min/{1.73_m2} — ABNORMAL LOW (ref >59)
Glucose: 94 mg/dL (ref 65–99)
Potassium: 4 mmol/L (ref 3.5–5.2)
Sodium: 141 mmol/L (ref 134–144)

## 2018-04-07 LAB — CBC WITH DIFFERENTIAL/PLATELET
Basophils Absolute: 0.1 10*3/uL (ref 0.0–0.2)
Basos: 1 %
EOS (ABSOLUTE): 0.2 10*3/uL (ref 0.0–0.4)
EOS: 3 %
HEMATOCRIT: 33.5 % — AB (ref 37.5–51.0)
HEMOGLOBIN: 11.4 g/dL — AB (ref 13.0–17.7)
IMMATURE GRANS (ABS): 0 10*3/uL (ref 0.0–0.1)
Immature Granulocytes: 0 %
LYMPHS ABS: 2.2 10*3/uL (ref 0.7–3.1)
Lymphs: 40 %
MCH: 30.2 pg (ref 26.6–33.0)
MCHC: 34 g/dL (ref 31.5–35.7)
MCV: 89 fL (ref 79–97)
MONOCYTES: 8 %
Monocytes Absolute: 0.4 10*3/uL (ref 0.1–0.9)
NEUTROS ABS: 2.7 10*3/uL (ref 1.4–7.0)
Neutrophils: 48 %
Platelets: 203 10*3/uL (ref 150–450)
RBC: 3.77 x10E6/uL — AB (ref 4.14–5.80)
RDW: 13 % (ref 12.3–15.4)
WBC: 5.5 10*3/uL (ref 3.4–10.8)

## 2018-04-07 LAB — LIPID PANEL
CHOL/HDL RATIO: 3.8 ratio (ref 0.0–5.0)
Cholesterol, Total: 109 mg/dL (ref 100–199)
HDL: 29 mg/dL — ABNORMAL LOW (ref >39)
LDL CALC: 56 mg/dL (ref 0–99)
TRIGLYCERIDES: 119 mg/dL (ref 0–149)
VLDL CHOLESTEROL CAL: 24 mg/dL (ref 5–40)

## 2018-04-07 LAB — HEPATIC FUNCTION PANEL
ALBUMIN: 3.9 g/dL (ref 3.5–4.8)
ALT: 22 IU/L (ref 0–44)
AST: 29 IU/L (ref 0–40)
Alkaline Phosphatase: 88 IU/L (ref 39–117)
Bilirubin Total: 0.3 mg/dL (ref 0.0–1.2)
Bilirubin, Direct: 0.09 mg/dL (ref 0.00–0.40)
Total Protein: 6.5 g/dL (ref 6.0–8.5)

## 2018-04-12 ENCOUNTER — Telehealth: Payer: Self-pay | Admitting: Family Medicine

## 2018-04-12 NOTE — Telephone Encounter (Signed)
Patient aware of results and verbalizes understanding.  

## 2018-05-21 DIAGNOSIS — C61 Malignant neoplasm of prostate: Secondary | ICD-10-CM | POA: Diagnosis not present

## 2018-05-26 ENCOUNTER — Ambulatory Visit: Payer: Medicare HMO | Admitting: Urology

## 2018-05-26 DIAGNOSIS — N401 Enlarged prostate with lower urinary tract symptoms: Secondary | ICD-10-CM

## 2018-05-26 DIAGNOSIS — N5201 Erectile dysfunction due to arterial insufficiency: Secondary | ICD-10-CM | POA: Diagnosis not present

## 2018-05-26 DIAGNOSIS — C61 Malignant neoplasm of prostate: Secondary | ICD-10-CM | POA: Diagnosis not present

## 2018-06-07 ENCOUNTER — Other Ambulatory Visit: Payer: Self-pay | Admitting: Family Medicine

## 2018-06-16 ENCOUNTER — Other Ambulatory Visit: Payer: Self-pay | Admitting: Family Medicine

## 2018-07-27 ENCOUNTER — Telehealth: Payer: Self-pay | Admitting: Family Medicine

## 2018-07-27 NOTE — Telephone Encounter (Signed)
No samples patient aware. 

## 2018-08-02 NOTE — Telephone Encounter (Signed)
Aware - still no samples

## 2018-08-02 NOTE — Telephone Encounter (Signed)
Patient asking for Januvia samples

## 2018-08-10 DIAGNOSIS — L84 Corns and callosities: Secondary | ICD-10-CM | POA: Diagnosis not present

## 2018-08-10 DIAGNOSIS — B351 Tinea unguium: Secondary | ICD-10-CM | POA: Diagnosis not present

## 2018-08-10 DIAGNOSIS — E1151 Type 2 diabetes mellitus with diabetic peripheral angiopathy without gangrene: Secondary | ICD-10-CM | POA: Diagnosis not present

## 2018-08-10 DIAGNOSIS — M79676 Pain in unspecified toe(s): Secondary | ICD-10-CM | POA: Diagnosis not present

## 2018-08-11 ENCOUNTER — Encounter: Payer: Self-pay | Admitting: Family Medicine

## 2018-08-11 ENCOUNTER — Ambulatory Visit (INDEPENDENT_AMBULATORY_CARE_PROVIDER_SITE_OTHER): Payer: Medicare Other | Admitting: Family Medicine

## 2018-08-11 VITALS — BP 110/66 | HR 76 | Temp 98.6°F | Ht 67.5 in | Wt 219.0 lb

## 2018-08-11 DIAGNOSIS — I10 Essential (primary) hypertension: Secondary | ICD-10-CM

## 2018-08-11 DIAGNOSIS — I700 Atherosclerosis of aorta: Secondary | ICD-10-CM

## 2018-08-11 DIAGNOSIS — D508 Other iron deficiency anemias: Secondary | ICD-10-CM

## 2018-08-11 DIAGNOSIS — Z1211 Encounter for screening for malignant neoplasm of colon: Secondary | ICD-10-CM

## 2018-08-11 DIAGNOSIS — Z87438 Personal history of other diseases of male genital organs: Secondary | ICD-10-CM | POA: Diagnosis not present

## 2018-08-11 DIAGNOSIS — Z720 Tobacco use: Secondary | ICD-10-CM

## 2018-08-11 DIAGNOSIS — C61 Malignant neoplasm of prostate: Secondary | ICD-10-CM

## 2018-08-11 DIAGNOSIS — E119 Type 2 diabetes mellitus without complications: Secondary | ICD-10-CM

## 2018-08-11 DIAGNOSIS — E1165 Type 2 diabetes mellitus with hyperglycemia: Secondary | ICD-10-CM

## 2018-08-11 DIAGNOSIS — E7849 Other hyperlipidemia: Secondary | ICD-10-CM | POA: Diagnosis not present

## 2018-08-11 DIAGNOSIS — E559 Vitamin D deficiency, unspecified: Secondary | ICD-10-CM | POA: Diagnosis not present

## 2018-08-11 DIAGNOSIS — Z794 Long term (current) use of insulin: Secondary | ICD-10-CM

## 2018-08-11 DIAGNOSIS — I739 Peripheral vascular disease, unspecified: Secondary | ICD-10-CM

## 2018-08-11 LAB — MICROSCOPIC EXAMINATION
Bacteria, UA: NONE SEEN
Epithelial Cells (non renal): NONE SEEN /hpf (ref 0–10)
RBC MICROSCOPIC, UA: NONE SEEN /HPF (ref 0–2)
Renal Epithel, UA: NONE SEEN /hpf
WBC UA: NONE SEEN /HPF (ref 0–5)

## 2018-08-11 LAB — URINALYSIS, COMPLETE
Bilirubin, UA: NEGATIVE
Glucose, UA: NEGATIVE
Ketones, UA: NEGATIVE
Leukocytes, UA: NEGATIVE
Nitrite, UA: NEGATIVE
PH UA: 6 (ref 5.0–7.5)
Protein, UA: NEGATIVE
RBC, UA: NEGATIVE
Specific Gravity, UA: 1.015 (ref 1.005–1.030)
Urobilinogen, Ur: 1 mg/dL (ref 0.2–1.0)

## 2018-08-11 LAB — BAYER DCA HB A1C WAIVED: HB A1C (BAYER DCA - WAIVED): 6.3 % (ref <7.0)

## 2018-08-11 NOTE — Patient Instructions (Addendum)
Medicare Annual Wellness Visit  [facility] and the medical providers at [facility] strive to bring you the best medical care.  In doing so we not only want to address your current medical conditions and concerns but also to detect new conditions early and prevent illness, disease and health-related problems.    Medicare offers a yearly Wellness Visit which allows our clinical staff to assess your need for preventative services including immunizations, lifestyle education, counseling to decrease risk of preventable diseases and screening for fall risk and other medical concerns.    This visit is provided free of charge (no copay) for all Medicare recipients. The clinical pharmacists at [facility] have begun to conduct these Wellness Visits which will also include a thorough review of all your medications.    As you primary medical provider recommend that you make an appointment for your Annual Wellness Visit if you have not done so already this year.  You may set up this appointment before you leave today or you may call back ([phone]) and schedule an appointment.  Please make sure when you call that you mention that you are scheduling your Annual Wellness Visit with the clinical pharmacist so that the appointment may be made for the proper length of time.    Continue current medications. Continue good therapeutic lifestyle changes which include good diet and exercise. Fall precautions discussed with patient. If an FOBT was given today- please return it to our front desk. If you are over 23 years old - you may need Prevnar 93 or the adult Pneumonia vaccine.  **Flu shots are available--- please call and schedule a FLU-CLINIC appointment**  After your visit with [place] today you will receive a survey in the mail or online from [employer] regarding your care with [place]. Please take a moment to fill this out. Your feedback is very  important to [place] as you can help [place] better understand your patient needs as well as improve your experience and satisfaction. WE CARE ABOUT YOU!!!   We will call with lab work results as soon as those results are returned We will schedule you with the clinical pharmacist to discuss techniques for smoking cessation and treatment recommendations. We will refill all of your medicines Continue with aggressive therapeutic lifestyle changes pending results of the return of your lab work. Take Pepcid AC twice daily before breakfast and supper and the generic version of this is famotidine Do not forget to follow-up with a gastroenterologist for repeat colonoscopy in March 2021

## 2018-08-11 NOTE — Addendum Note (Signed)
Addended by: Zannie Cove on: 08/11/2018 11:40 AM   Modules accepted: Orders

## 2018-08-11 NOTE — Progress Notes (Signed)
Subjective:    Patient ID: Andrew [name], male    DOB: 1946-05-28, 73 y.o.   MRN: [id number]  HPI Pt here for follow up and management of chronic medical problems which includes diabetes and hypertension. He is taking medication regularly.  The patient is doing well overall.  He is due for a level for physical exam today.  He wants to stop smoking.  We will schedule him for a visit with the clinical pharmacist to discuss smoking cessation techniques and therapy.  He is requesting refills on all his medicines.  He brought his FOBT in today.  He will get lab work today a urine today and get his PSA and prostate check today.  He is diabetic.  His weight is stable at 219 pounds.  He is obese.  He has erectile dysfunction.  He also has hyperlipidemia vitamin D deficiency and hypertension.  Patient today says that he is smoking at least 1 pack of cigarettes per day and we will arrange for him to see the clinical pharmacist and he is agreeable to that.  He has seen Dr. Percival Spanish once and was told he did not need to come back and worse he needed him for increased pain or pressure beyond his normal.  He denies any shortness of breath.  He does have heartburn and is still taking ranitidine and we will have him change to Pepcid AC.  He denies any blood in the stool or has any black tarry bowel movements.  And there is been no change in bowel habits.  He is passing his water well other than just having urinary frequency.  He does bring in blood sugars for review and overall the few that he brings in look pretty good ranging from the 90s up to as high as 150.   Patient Active Problem List   Diagnosis Date Noted  . SOB (shortness of breath) 12/10/2016  . Erectile dysfunction due to arterial insufficiency 10/30/2016  . Polyp of colon 10/30/2016  . Peripheral vascular insufficiency ([place]) 06/13/2016  . Vitamin D deficiency 08/24/2015  . Prostate cancer ([place]) 04/07/2014  . Diabetes mellitus type 2, uncontrolled ([place])  09/14/2010  . BPH (benign prostatic hyperplasia) 09/14/2010  . HTN (hypertension) 09/14/2010  . Hyperlipidemia associated with type 2 diabetes mellitus ([place]) 09/14/2010  . Tobacco abuse 09/14/2010   Outpatient Encounter Medications as of 08/11/2018  Medication Sig  . ACCU-CHEK AVIVA PLUS test strip CHECK BLOOD SUGAR  UP  TO THREE TIMES DAILY  . aspirin 81 MG tablet Take 81 mg by mouth daily.  Marland Kitchen atorvastatin (LIPITOR) 40 MG tablet TAKE 1 TABLET EVERY DAY  . blood glucose meter kit and supplies Dispense based on patient and insurance preference. Use up to four times daily as directed. (FOR ICD-10 E10.9, E11.9).  . cholecalciferol (VITAMIN D) 1000 UNITS tablet Take 1,000 Units by mouth daily.   . Cinnamon 500 MG capsule Take 500 mg by mouth daily as needed (blood glucose levels).  Marland Kitchen lisinopril-hydrochlorothiazide (PRINZIDE,ZESTORETIC) 20-25 MG tablet TAKE 1 TABLET EVERY DAY  . metFORMIN (GLUCOPHAGE) 1000 MG tablet TAKE 1 TABLET TWICE DAILY WITH A MEAL  . Omega-3 Fatty Acids (FISH OIL) 1000 MG CPDR Take by mouth. One a day  . sitaGLIPtin (JANUVIA) 100 MG tablet Take 1 tablet (100 mg total) by mouth daily.  . tadalafil (ADCIRCA/CIALIS) 20 MG tablet   . tamsulosin (FLOMAX) 0.4 MG CAPS capsule Take 1 capsule (0.4 mg total) by mouth daily.  . [DISCONTINUED] ferrous sulfate  325 (65 FE) MG tablet TAKE 1 TABLET BY MOUTH ONCE DAILY WITH BREAKFAST   No facility-administered encounter medications on file as of 08/11/2018.       Review of Systems  Constitutional: Negative.   HENT: Negative.   Eyes: Negative.   Respiratory: Negative.   Cardiovascular: Negative.   Gastrointestinal: Negative.   Endocrine: Negative.   Genitourinary: Negative.   Musculoskeletal: Negative.   Skin: Negative.   Allergic/Immunologic: Negative.   Neurological: Negative.   Hematological: Negative.   Psychiatric/Behavioral: Negative.        Objective:   Physical Exam Vitals signs and nursing note reviewed.    Constitutional:      General: He is not in acute distress.    Appearance: Normal appearance. He is well-developed. He is obese.     Comments: The patient is calm pleasant and relaxed.  HENT:     Head: Normocephalic and atraumatic.     Right Ear: Tympanic membrane, ear canal and external ear normal. There is no impacted cerumen.     Left Ear: Tympanic membrane, ear canal and external ear normal. There is no impacted cerumen.     Nose: Nose normal. No congestion.     Mouth/Throat:     Mouth: Mucous membranes are moist.     Pharynx: Oropharynx is clear. No oropharyngeal exudate or posterior oropharyngeal erythema.  Eyes:     General: No scleral icterus.       Right eye: No discharge.        Left eye: No discharge.     Extraocular Movements: Extraocular movements intact.     Conjunctiva/sclera: Conjunctivae normal.     Pupils: Pupils are equal, round, and reactive to light.     Comments: Patient is in need of getting his eye exam.  Neck:     Musculoskeletal: Normal range of motion and neck supple.     Thyroid: No thyromegaly.     Vascular: No carotid bruit.     Trachea: No tracheal deviation.     Comments: There is no thyromegaly anterior cervical adenopathy or bruits. Cardiovascular:     Rate and Rhythm: Normal rate and regular rhythm.     Heart sounds: Normal heart sounds. No murmur.     Comments: The heart has a regular rate and rhythm at 72/min.  He has good pedal pulses except the dorsalis pedis on the left is difficult to find.  There is no edema. Pulmonary:     Effort: Pulmonary effort is normal.     Breath sounds: Normal breath sounds. No wheezing or rales.     Comments: Clear anteriorly and posteriorly and no axillary adenopathy or chest wall masses. Chest:     Chest wall: No tenderness.  Abdominal:     General: Bowel sounds are normal.     Palpations: Abdomen is soft. There is no mass.     Tenderness: There is no abdominal tenderness.     Comments: Abdomen is obese  without liver or spleen enlargement masses bruits or inguinal adenopathy  Genitourinary:    Penis: Normal.      Scrotum/Testes: Normal.     Rectum: Normal.     Comments: The prostate gland is absent secondary to surgical removal for prostate cancer.  There were no lumps or masses in the prostate vault.  The rectal exam was negative.  The external genitalia were within normal limits and no testicular enlargement and no inguinal hernias were palpable. Musculoskeletal: Normal range of motion.  General: No deformity.     Right lower leg: No edema.     Left lower leg: No edema.  Lymphadenopathy:     Cervical: No cervical adenopathy.  Skin:    General: Skin is warm and dry.     Findings: No rash.  Neurological:     General: No focal deficit present.     Mental Status: He is alert and oriented to person, place, and time. Mental status is at baseline.     Cranial Nerves: No cranial nerve deficit.     Sensory: No sensory deficit.     Motor: No weakness.     Deep Tendon Reflexes: Reflexes are normal and symmetric. Reflexes normal.  Psychiatric:        Mood and Affect: Mood normal.        Behavior: Behavior normal.        Thought Content: Thought content normal.        Judgment: Judgment normal.     Comments: Mood affect and behavior for this patient are normal.     BP 110/66 (BP Location: Left Arm)   Pulse 76   Temp 98.6 F (37 C) (Oral)   Ht 5' 7.5" (1.715 m)   Wt 219 lb (99.3 kg)   BMI 33.79 kg/m        Assessment & Plan:  1. Uncontrolled type 2 diabetes mellitus with hyperglycemia ([place]) -Continue with current treatment pending results of lab work - CBC with Differential/Platelet  2. Other hyperlipidemia -Rest of therapeutic lifestyle changes and current treatment pending results of lab work - CBC with Differential/Platelet - Lipid panel  3. Essential hypertension -Blood pressure is good today and he will continue with current treatment and make all efforts to lose  weight through diet and exercise and avoid sodium. - BMP8+EGFR - CBC with Differential/Platelet - Hepatic function panel  4. History of BPH -Patient has had prostate removed.  The rectal exam indicated no recurrence of lumps or masses in the prostate vault. - CBC with Differential/Platelet - PSA, total and free - Urinalysis, Complete  5. Vitamin D deficiency -Continue with vitamin D replacement pending results of lab work - CBC with Differential/Platelet - VITAMIN D 25 Hydroxy (Vit-D Deficiency, Fractures)  6. Screen for colon cancer - Fecal occult blood, imunochemical  7. Prostate cancer ([place]) -The prostate vault is empty of masses or lumps.  A PSA is pending.  8. Peripheral vascular insufficiency ([place]) -Patient has diminished pulses in the dorsalis pedis of the left foot.  9. Other iron deficiency anemia -Continue with iron replacement pending results of lab work  10. Type 2 diabetes mellitus with insulin therapy ([place]) -Continue with current therapy pending results of lab work and A1c  11. Aortic atherosclerosis ([place]) -Continue with statin therapy and aggressive therapeutic lifestyle changes pending results of lab work  12. Nicotine abuse -Appointment with clinical pharmacy to discuss nicotine cessation and choose appropriate therapy to help this patient stop smoking pending on his current medications and insurance.  No orders of the defined types were placed in this encounter.  Patient Instructions                       Medicare Annual Wellness Visit  [facility] and the medical providers at [facility] strive to bring you the best medical care.  In doing so we not only want to address your current medical conditions and concerns but also to detect new conditions early and  prevent illness, disease and health-related problems.    Medicare offers a yearly Wellness Visit which allows our clinical staff to assess your need for preventative services  including immunizations, lifestyle education, counseling to decrease risk of preventable diseases and screening for fall risk and other medical concerns.    This visit is provided free of charge (no copay) for all Medicare recipients. The clinical pharmacists at [facility] have begun to conduct these Wellness Visits which will also include a thorough review of all your medications.    As you primary medical provider recommend that you make an appointment for your Annual Wellness Visit if you have not done so already this year.  You may set up this appointment before you leave today or you may call back ([phone]) and schedule an appointment.  Please make sure when you call that you mention that you are scheduling your Annual Wellness Visit with the clinical pharmacist so that the appointment may be made for the proper length of time.    Continue current medications. Continue good therapeutic lifestyle changes which include good diet and exercise. Fall precautions discussed with patient. If an FOBT was given today- please return it to our front desk. If you are over 89 years old - you may need Prevnar 52 or the adult Pneumonia vaccine.  **Flu shots are available--- please call and schedule a FLU-CLINIC appointment**  After your visit with [place] today you will receive a survey in the mail or online from [employer] regarding your care with [place]. Please take a moment to fill this out. Your feedback is very important to [place] as you can help [place] better understand your patient needs as well as improve your experience and satisfaction. WE CARE ABOUT YOU!!!   We will call with lab work results as soon as those results are returned We will schedule you with the clinical pharmacist to discuss techniques for smoking cessation and treatment recommendations. We will refill all of your medicines Continue with aggressive therapeutic lifestyle changes pending results of the return of your lab  work. Take Pepcid AC twice daily before breakfast and supper and the generic version of this is famotidine Do not forget to follow-up with a gastroenterologist for repeat colonoscopy in March 2021   Arrie Senate MD

## 2018-08-12 ENCOUNTER — Other Ambulatory Visit: Payer: Self-pay | Admitting: *Deleted

## 2018-08-12 LAB — BMP8+EGFR
BUN/Creatinine Ratio: 15 (ref 10–24)
BUN: 19 mg/dL (ref 8–27)
CALCIUM: 9.2 mg/dL (ref 8.6–10.2)
CHLORIDE: 101 mmol/L (ref 96–106)
CO2: 22 mmol/L (ref 20–29)
Creatinine, Ser: 1.24 mg/dL (ref 0.76–1.27)
GFR calc non Af Amer: 58 mL/min/{1.73_m2} — ABNORMAL LOW (ref >59)
GFR, EST AFRICAN AMERICAN: 67 mL/min/{1.73_m2} (ref >59)
Glucose: 100 mg/dL — ABNORMAL HIGH (ref 65–99)
Potassium: 4.1 mmol/L (ref 3.5–5.2)
Sodium: 139 mmol/L (ref 134–144)

## 2018-08-12 LAB — PSA, TOTAL AND FREE
PSA, Free Pct: 19 %
PSA, Free: 0.19 ng/mL
Prostate Specific Ag, Serum: 1 ng/mL (ref 0.0–4.0)

## 2018-08-12 LAB — HEPATIC FUNCTION PANEL
ALT: 37 IU/L (ref 0–44)
AST: 47 IU/L — ABNORMAL HIGH (ref 0–40)
Albumin: 3.9 g/dL (ref 3.7–4.7)
Alkaline Phosphatase: 91 IU/L (ref 39–117)
Bilirubin Total: 0.4 mg/dL (ref 0.0–1.2)
Bilirubin, Direct: 0.1 mg/dL (ref 0.00–0.40)
Total Protein: 6.7 g/dL (ref 6.0–8.5)

## 2018-08-12 LAB — LIPID PANEL
Chol/HDL Ratio: 3.8 ratio (ref 0.0–5.0)
Cholesterol, Total: 127 mg/dL (ref 100–199)
HDL: 33 mg/dL — ABNORMAL LOW (ref >39)
LDL Calculated: 62 mg/dL (ref 0–99)
TRIGLYCERIDES: 162 mg/dL — AB (ref 0–149)
VLDL Cholesterol Cal: 32 mg/dL (ref 5–40)

## 2018-08-12 LAB — CBC WITH DIFFERENTIAL/PLATELET
BASOS ABS: 0.1 10*3/uL (ref 0.0–0.2)
BASOS: 1 %
EOS (ABSOLUTE): 0.3 10*3/uL (ref 0.0–0.4)
Eos: 5 %
Hematocrit: 32.9 % — ABNORMAL LOW (ref 37.5–51.0)
Hemoglobin: 11.4 g/dL — ABNORMAL LOW (ref 13.0–17.7)
IMMATURE GRANS (ABS): 0 10*3/uL (ref 0.0–0.1)
Immature Granulocytes: 0 %
LYMPHS ABS: 2.3 10*3/uL (ref 0.7–3.1)
LYMPHS: 35 %
MCH: 30.3 pg (ref 26.6–33.0)
MCHC: 34.7 g/dL (ref 31.5–35.7)
MCV: 88 fL (ref 79–97)
MONOCYTES: 8 %
Monocytes Absolute: 0.5 10*3/uL (ref 0.1–0.9)
NEUTROS ABS: 3.4 10*3/uL (ref 1.4–7.0)
Neutrophils: 51 %
PLATELETS: 219 10*3/uL (ref 150–450)
RBC: 3.76 x10E6/uL — AB (ref 4.14–5.80)
RDW: 13.5 % (ref 11.6–15.4)
WBC: 6.5 10*3/uL (ref 3.4–10.8)

## 2018-08-12 LAB — FECAL OCCULT BLOOD, IMMUNOCHEMICAL: Fecal Occult Bld: NEGATIVE

## 2018-08-12 LAB — VITAMIN D 25 HYDROXY (VIT D DEFICIENCY, FRACTURES): Vit D, 25-Hydroxy: 35.5 ng/mL (ref 30.0–100.0)

## 2018-08-12 MED ORDER — ICOSAPENT ETHYL 1 G PO CAPS: 2.0000 g | ORAL_CAPSULE | Freq: Two times a day (BID) | ORAL | 5 refills | Status: DC

## 2018-08-13 ENCOUNTER — Telehealth: Payer: Self-pay | Admitting: Family Medicine

## 2018-08-13 NOTE — Telephone Encounter (Signed)
Pt aware -vascepa was sent - ins will not pay - pt will not pick up

## 2018-08-30 ENCOUNTER — Telehealth: Payer: Self-pay | Admitting: Family Medicine

## 2018-08-30 MED ORDER — TAMSULOSIN HCL 0.4 MG PO CAPS: 0.4000 mg | ORAL_CAPSULE | Freq: Every day | ORAL | 3 refills | Status: DC

## 2018-08-30 MED ORDER — ATORVASTATIN CALCIUM 40 MG PO TABS: 40.0000 mg | ORAL_TABLET | Freq: Every day | ORAL | 3 refills | Status: DC

## 2018-08-30 MED ORDER — SITAGLIPTIN PHOSPHATE 100 MG PO TABS: 100.0000 mg | ORAL_TABLET | Freq: Every day | ORAL | 3 refills | Status: DC

## 2018-08-30 MED ORDER — METFORMIN HCL 1000 MG PO TABS: ORAL_TABLET | ORAL | 3 refills | Status: DC

## 2018-08-30 MED ORDER — LISINOPRIL-HYDROCHLOROTHIAZIDE 20-25 MG PO TABS: 1.0000 | ORAL_TABLET | Freq: Every day | ORAL | 3 refills | Status: DC

## 2018-08-30 NOTE — Telephone Encounter (Signed)
Pt is wanting to talk Roselyn Reef about his medicine, didn't want to give much information

## 2018-08-30 NOTE — Telephone Encounter (Signed)
Pt needed RF sent to mail order = done

## 2018-09-01 ENCOUNTER — Encounter: Payer: Self-pay | Admitting: Pharmacist Clinician (PhC)/ Clinical Pharmacy Specialist

## 2018-09-01 ENCOUNTER — Ambulatory Visit (INDEPENDENT_AMBULATORY_CARE_PROVIDER_SITE_OTHER): Payer: Medicare Other | Admitting: Pharmacist Clinician (PhC)/ Clinical Pharmacy Specialist

## 2018-09-01 VITALS — BP 115/68 | HR 80

## 2018-09-01 DIAGNOSIS — Z720 Tobacco use: Secondary | ICD-10-CM | POA: Diagnosis not present

## 2018-09-01 DIAGNOSIS — Z794 Long term (current) use of insulin: Secondary | ICD-10-CM | POA: Diagnosis not present

## 2018-09-01 DIAGNOSIS — E119 Type 2 diabetes mellitus without complications: Secondary | ICD-10-CM

## 2018-09-01 MED ORDER — VARENICLINE TARTRATE 0.5 MG X 11 & 1 MG X 42 PO MISC: ORAL | 0 refills | Status: DC

## 2018-09-01 NOTE — Progress Notes (Signed)
Subjective:     Andrew [name] is a 73 y.o. male here for a discussion regarding smoking cessation. He began smoking 54 years ago. He currently smokes 1 pack per day. He has attempted to quit smoking in the past, most recently 10 years ago. Best success quitting using in hospital for 3 days. Barriers to quitting include: friends smoke. He denies chest pain, dyspnea on exertion, dyspnea while laying down, hemoptysis, non productive cough, productive cough, shortness of breath and wheezing.    Review of Systems Pertinent items are noted in HPI.    Objective:    Patient is a pleasant 73 year old male who is accompanied on the visit by his wife who quit smoking over 10 year ago.  He is concerned about health problems, negative social perception of smoking, and costs of smoking as reasons he wants to quit.  He is interested in medication options to help him quit other than nicotine patches.    Assessment:    Tobacco Use/Cessation.  I assessed Andrew [name] to be in an action stage with respect to tobacco use.    Plan:  Discussed in detail Chantix versus Wellbutrin for smoking cessation explaining MOA, benefit, cost, success rates and possible adverse effects and how to minimize them. Patient decided on Chatix (depending on insurance coverage if he can not afford it we will switch to Wellbutrin he has no history of seizures or depression/anxiety)/  Patient will take last dose after supper and avoid taking it later than 7pm to avoid nightmares or vivid dreams.  Counseled on if any personality changes occur to contact me.  He will quit smoking after 1 week on Chantix.  I advised patient to quit, and offered support. Neurosurgeon distributed. Discussed current use pattern. Brochure to patient. Quit date set for 10/10/2018. Reviewed strategies to maximize success. Commended for decision. Follow up with PCP in 4 weeks and or as needed. I spent approximately 30 minutes counseling the patient.     Memory Argue, PharmD, CPP. CLS Referred by Chipper Herb, MD

## 2018-09-07 ENCOUNTER — Telehealth: Payer: Self-pay | Admitting: Family Medicine

## 2018-09-07 NOTE — Telephone Encounter (Signed)
Samples up front - pt aware  ?

## 2018-11-17 ENCOUNTER — Other Ambulatory Visit: Payer: Self-pay | Admitting: Family Medicine

## 2018-11-17 NOTE — Telephone Encounter (Signed)
Patient aware we are out of Januvia.

## 2018-11-24 ENCOUNTER — Telehealth: Payer: Self-pay | Admitting: Family Medicine

## 2018-11-24 NOTE — Telephone Encounter (Signed)
Patient aware we do not have any samples.

## 2018-12-01 ENCOUNTER — Ambulatory Visit (INDEPENDENT_AMBULATORY_CARE_PROVIDER_SITE_OTHER): Payer: Medicare Other | Admitting: Urology

## 2018-12-01 DIAGNOSIS — N5201 Erectile dysfunction due to arterial insufficiency: Secondary | ICD-10-CM | POA: Diagnosis not present

## 2018-12-01 DIAGNOSIS — N401 Enlarged prostate with lower urinary tract symptoms: Secondary | ICD-10-CM | POA: Diagnosis not present

## 2018-12-01 DIAGNOSIS — C61 Malignant neoplasm of prostate: Secondary | ICD-10-CM

## 2018-12-07 ENCOUNTER — Telehealth: Payer: Self-pay | Admitting: Family Medicine

## 2018-12-07 NOTE — Telephone Encounter (Signed)
No samples available. Patient notified 

## 2018-12-14 DIAGNOSIS — E1151 Type 2 diabetes mellitus with diabetic peripheral angiopathy without gangrene: Secondary | ICD-10-CM | POA: Diagnosis not present

## 2018-12-14 DIAGNOSIS — L84 Corns and callosities: Secondary | ICD-10-CM | POA: Diagnosis not present

## 2018-12-14 DIAGNOSIS — B351 Tinea unguium: Secondary | ICD-10-CM | POA: Diagnosis not present

## 2018-12-22 ENCOUNTER — Encounter: Payer: Self-pay | Admitting: Family Medicine

## 2018-12-22 ENCOUNTER — Other Ambulatory Visit: Payer: Self-pay

## 2018-12-22 ENCOUNTER — Ambulatory Visit (INDEPENDENT_AMBULATORY_CARE_PROVIDER_SITE_OTHER): Payer: Medicare Other | Admitting: Family Medicine

## 2018-12-22 DIAGNOSIS — E1169 Type 2 diabetes mellitus with other specified complication: Secondary | ICD-10-CM | POA: Diagnosis not present

## 2018-12-22 DIAGNOSIS — E1165 Type 2 diabetes mellitus with hyperglycemia: Secondary | ICD-10-CM | POA: Diagnosis not present

## 2018-12-22 DIAGNOSIS — E782 Mixed hyperlipidemia: Secondary | ICD-10-CM

## 2018-12-22 DIAGNOSIS — C61 Malignant neoplasm of prostate: Secondary | ICD-10-CM

## 2018-12-22 DIAGNOSIS — E785 Hyperlipidemia, unspecified: Secondary | ICD-10-CM

## 2018-12-22 DIAGNOSIS — Z0000 Encounter for general adult medical examination without abnormal findings: Secondary | ICD-10-CM

## 2018-12-22 DIAGNOSIS — I10 Essential (primary) hypertension: Secondary | ICD-10-CM

## 2018-12-22 DIAGNOSIS — I739 Peripheral vascular disease, unspecified: Secondary | ICD-10-CM

## 2018-12-22 DIAGNOSIS — I700 Atherosclerosis of aorta: Secondary | ICD-10-CM

## 2018-12-22 DIAGNOSIS — E559 Vitamin D deficiency, unspecified: Secondary | ICD-10-CM

## 2018-12-22 DIAGNOSIS — D508 Other iron deficiency anemias: Secondary | ICD-10-CM

## 2018-12-22 NOTE — Patient Instructions (Addendum)
Continue to practice good hand and respiratory hygiene Follow-up with cardiology as recommended by the cardiologist Follow-up with urologist because of prostate cancer Follow as aggressive therapeutic lifestyle changes as possible including diet and exercise to achieve weight loss Drink plenty of water and fluids Avoid climbing to keep from falling Exercise regularly and keep feet checked regularly Schedule visits with podiatrist if problems occur with feet Patient is currently not taking a statin drug.  If cholesterol numbers come back elevated this needs to be started and even if they do not because he has diabetes he should be started on a statin drug if he can afford it.

## 2018-12-22 NOTE — Progress Notes (Signed)
Virtual Visit Via telephone Note I connected with@ on 12/22/18 by telephone and verified that I am speaking with the correct person or authorized healthcare agent using two identifiers. Andrew [name] is currently located at home and there are no unauthorized people in close proximity. I completed this visit while in a private location in my home .  This visit type was conducted due to national recommendations for restrictions regarding the COVID-19 Pandemic (e.g. social distancing).  This format is felt to be most appropriate for this patient at this time.  All issues noted in this document were discussed and addressed.  No physical exam was performed.    I discussed the limitations, risks, security and privacy concerns of performing an evaluation and management service by telephone and the availability of in person appointments. I also discussed with the patient that there may be a patient responsible charge related to this service. The patient expressed understanding and agreed to proceed.   Date:  12/22/2018    ID:  Andrew [name]      [birth year]        [id number]   Patient Care Team Patient Care Team: Chipper Herb, MD as PCP - General (Family Medicine) Minus Breeding, MD as Consulting Physician (Cardiology) Alyson Ingles Candee Furbish, MD as Consulting Physician (Urology) Alyson Ingles Candee Furbish, MD as Consulting Physician (Urology)  Reason for Visit: Primary Care Follow-up     History of Present Illness & Review of Systems:     Andrew [name] is a 73 y.o. year old male primary care patient that presents today for a telehealth visit.  The patient is pleasant and alert.  His wife was assisting him with answers to questions in the background.  The patient today he denies any chest pain pressure tightness or shortness of breath.  He denies any trouble with swallowing heartburn indigestion nausea vomiting diarrhea blood in the stool or black tarry bowel movements.  He does have some occasional  cramping in his right side and this is been a problem in the past.  He denies any trouble with passing his water burning pain frequency or blood in the urine and is followed regularly by Dr. Alyson Ingles and will be seen again there in December.  Review of systems as stated, otherwise negative.  The patient does not have symptoms concerning for COVID-19 infection (fever, chills, cough, or new shortness of breath).      Current Medications (Verified) Allergies as of 12/22/2018   No Known Allergies     Medication List       Accurate as of December 22, 2018 10:00 AM. If you have any questions, ask your nurse or doctor.        Accu-Chek Aviva Plus test strip Generic drug: glucose blood CHECK BLOOD SUGAR  UP  TO THREE TIMES DAILY   aspirin 81 MG tablet Take 81 mg by mouth daily.   atorvastatin 40 MG tablet Commonly known as: LIPITOR Take 1 tablet (40 mg total) by mouth daily.   blood glucose meter kit and supplies Dispense based on patient and insurance preference. Use up to four times daily as directed. (FOR ICD-10 E10.9, E11.9).   cholecalciferol 1000 units tablet Commonly known as: VITAMIN D Take 1,000 Units by mouth daily.   Cinnamon 500 MG capsule Take 500 mg by mouth daily as needed (blood glucose levels).   Fish Oil 1000 MG Cpdr Take by mouth. One a day   lisinopril-hydrochlorothiazide 20-25 MG tablet Commonly  known as: ZESTORETIC Take 1 tablet by mouth daily.   metFORMIN 1000 MG tablet Commonly known as: GLUCOPHAGE TAKE 1 TABLET TWICE DAILY WITH A MEAL   sitaGLIPtin 100 MG tablet Commonly known as: JANUVIA Take 1 tablet (100 mg total) by mouth daily.   tadalafil 20 MG tablet Commonly known as: CIALIS   tamsulosin 0.4 MG Caps capsule Commonly known as: FLOMAX Take 1 capsule (0.4 mg total) by mouth daily.   varenicline 0.5 MG X 11 & 1 MG X 42 tablet Commonly known as: CHANTIX PAK Take one 0.5 mg tablet by mouth once daily for 3 days, then increase to one 0.5 mg  tablet twice daily for 4 days, then increase to one 1 mg tablet twice daily.           Allergies (Verified)    Patient has no known allergies.  Past Medical History Past Medical History:  Diagnosis Date  . Anemia   . BPH (benign prostatic hyperplasia)    Sees Dr [name]  . Cataract   . Diabetes mellitus without complication ([place])   . Hyperlipidemia   . Hypertension   . Low serum vitamin D   . Prostate cancer ([place])    w/seed implantation and radiation     Past Surgical History:  Procedure Laterality Date  . COLONOSCOPY  2015  . PROSTATE SURGERY  2008   seed implant    Social History   Socioeconomic History  . Marital status: Married    Spouse name: Not on file  . Number of children: 2  . Years of education: 6  . Highest education level: 12th grade  Occupational History  . Occupation: [name]    Comment: Retired Geologist, engineering  Social Needs  . Financial resource strain: Not hard at all  . Food insecurity    Worry: Never true    Inability: Never true  . Transportation needs    Medical: No    Non-medical: No  Tobacco Use  . Smoking status: Current Every Day Smoker    Packs/day: 1.00    Years: 55.00    Pack years: 55.00    Types: Cigarettes    Start date: 06/30/1965  . Smokeless tobacco: Never Used  . Tobacco comment: Has tried nicotine replacement gum  Substance and Sexual Activity  . Alcohol use: No  . Drug use: No  . Sexual activity: Yes  Lifestyle  . Physical activity    Days per week: 3 days    Minutes per session: 60 min  . Stress: Not at all  Relationships  . Social connections    Talks on phone: More than three times a week    Gets together: More than three times a week    Attends religious service: More than 4 times per year    Active member of club or organization: Yes    Attends meetings of clubs or organizations: More than 4 times per year    Relationship status: Married  Other Topics Concern  . Not on file  Social History  Narrative   Lives at home with wife.       Family History  Problem Relation Age of Onset  . Diabetes Mother   . Heart disease Mother   . Kidney disease Mother        DIALYSIS  . Emphysema Father   . Deep vein thrombosis Sister   . Kidney disease Sister   . Cancer Sister        lung  .  Colon polyps Brother   . Hypertension Brother   . Gout Brother   . Colon cancer Neg Hx   . Esophageal cancer Neg Hx   . Rectal cancer Neg Hx   . Prostate cancer Neg Hx       Labs/Other Tests and Data Reviewed:    Wt Readings from Last 3 Encounters:  08/11/18 219 lb (99.3 kg)  04/06/18 218 lb (98.9 kg)  01/11/18 217 lb (98.4 kg)   Temp Readings from Last 3 Encounters:  08/11/18 98.6 F (37 C) (Oral)  04/06/18 98.5 F (36.9 C) (Oral)  12/01/17 98.2 F (36.8 C) (Oral)   BP Readings from Last 3 Encounters:  09/01/18 115/68  08/11/18 110/66  04/06/18 119/71   Pulse Readings from Last 3 Encounters:  09/01/18 80  08/11/18 76  04/06/18 68     Lab Results  Component Value Date   HGBA1C 6.3 08/11/2018   HGBA1C 6.5 04/06/2018   HGBA1C 6.3 12/01/2017   Lab Results  Component Value Date   MICROALBUR 100 11/21/2014   LDLCALC 62 08/11/2018   CREATININE 1.24 08/11/2018       Chemistry      Component Value Date/Time   NA 139 08/11/2018 0956   K 4.1 08/11/2018 0956   CL 101 08/11/2018 0956   CO2 22 08/11/2018 0956   BUN 19 08/11/2018 0956   CREATININE 1.24 08/11/2018 0956   CREATININE 1.16 11/18/2012 0844      Component Value Date/Time   CALCIUM 9.2 08/11/2018 0956   ALKPHOS 91 08/11/2018 0956   AST 47 (H) 08/11/2018 0956   ALT 37 08/11/2018 0956   BILITOT 0.4 08/11/2018 0956         OBSERVATIONS/ OBJECTIVE:     The patient answered questions appropriately today with his wife being in the background.  His blood pressure most recently was 137/82.  His weight is 214 pounds.  His blood sugars currently fasting are running around 120 and sometimes lower and of course  this is with him taking his Januvia on a regular basis.  He has an appointment for having his eyes checked in July and he recently saw the podiatrist, .  Dr. Irving Shows for his feet and everything was good with his feet.  Physical exam deferred due to nature of telephonic visit.  ASSESSMENT & PLAN    Time:   Today, I have spent 21 minutes with the patient via telephone discussing the above including Covid precautions.     Visit Diagnoses: 1. Essential hypertension -Sodium intake, lose weight, keep checking blood pressures at home regularly and stay on current treatment  2. Peripheral vascular insufficiency (HCC) -Walk and exercise regularly  3. Uncontrolled type 2 diabetes mellitus with hyperglycemia ([place]) -Most important for this patient is to take his medicines regularly.  He depends on [place] a lot of times to get his Januvia.  He will check with my nurse to see if we have any samples of Januvia to help him and we need to make every effort to get medications for him or help him get medicines as cheaply as possible.  Continue with aggressive therapeutic lifestyle changes including diet and exercise to achieve weight loss  4. Hyperlipidemia associated with type 2 diabetes mellitus ([place]) -The patient is currently not taking any statin drug.  This may be due to expense issues.  Get lab work and start statin drug if possible once lab work is returned.  5. Prostate cancer ([place]) -Continue to follow-up with Dr.  McKenzie.  Recent office visit was 2 to 3 weeks ago and has another appointment in December.  6. Vitamin D deficiency -Get vitamin D level and recommend treatment pending results  7. Mixed hyperlipidemia -Currently not taking any statin drug and according to the record he is not allergic to statin drugs.  Continue aggressive therapeutic lifestyle changes and begin treatment pending results of lab work  8. Aortic atherosclerosis ([place]) -Continue aggressive therapeutic lifestyle changes  9.  Other iron deficiency anemia -Check CBC  Patient Instructions  Continue to practice good hand and respiratory hygiene Follow-up with cardiology as recommended by the cardiologist Follow-up with urologist because of prostate cancer Follow as aggressive therapeutic lifestyle changes as possible including diet and exercise to achieve weight loss Drink plenty of water and fluids Avoid climbing to keep from falling Exercise regularly and keep feet checked regularly Schedule visits with podiatrist if problems occur with feet Patient is currently not taking a statin drug.  If cholesterol numbers come back elevated this needs to be started and even if they do not because he has diabetes he should be started on a statin drug if he can afford it.      The above assessment and management plan was discussed with the patient. The patient verbalized understanding of and has agreed to the management plan. Patient is aware to call the clinic if symptoms persist or worsen. Patient is aware when to return to the clinic for a follow-up visit. Patient educated on when it is appropriate to go to the emergency department.    Chipper Herb, MD [address], [place], [place] [address] Ph [phone]   Arrie Senate MD

## 2018-12-23 NOTE — Addendum Note (Signed)
Addended by: Zannie Cove on: 12/23/2018 09:56 AM   Modules accepted: Orders

## 2018-12-24 ENCOUNTER — Other Ambulatory Visit: Payer: Self-pay

## 2018-12-24 NOTE — Patient Outreach (Signed)
[facility]) Care Management  12/24/2018  Andrew [name] 21-Oct-1945 [id number]   Medication Adherence call to [name] HIPPA Compliant Voice message left with a call back number. [name] is showing past due on Metformin 1000 mg,Atorvastatin 40 mg and Lisinopril/Hctz 20/25 mg under [place].   [facility] Management Direct Dial [phone]  Fax [phone] [email or web address]

## 2018-12-28 ENCOUNTER — Telehealth: Payer: Self-pay | Admitting: Family Medicine

## 2018-12-28 DIAGNOSIS — I739 Peripheral vascular disease, unspecified: Secondary | ICD-10-CM

## 2018-12-28 DIAGNOSIS — I700 Atherosclerosis of aorta: Secondary | ICD-10-CM

## 2018-12-28 DIAGNOSIS — I10 Essential (primary) hypertension: Secondary | ICD-10-CM

## 2018-12-28 DIAGNOSIS — E1169 Type 2 diabetes mellitus with other specified complication: Secondary | ICD-10-CM

## 2018-12-28 DIAGNOSIS — E1165 Type 2 diabetes mellitus with hyperglycemia: Secondary | ICD-10-CM

## 2018-12-28 DIAGNOSIS — E785 Hyperlipidemia, unspecified: Secondary | ICD-10-CM

## 2018-12-28 NOTE — Telephone Encounter (Signed)
Pt called and aware of labs ordered - will set up appt with hochrein

## 2018-12-29 ENCOUNTER — Other Ambulatory Visit: Payer: Medicare Other

## 2018-12-29 ENCOUNTER — Other Ambulatory Visit: Payer: Self-pay

## 2018-12-29 DIAGNOSIS — I739 Peripheral vascular disease, unspecified: Secondary | ICD-10-CM | POA: Diagnosis not present

## 2018-12-29 DIAGNOSIS — E1165 Type 2 diabetes mellitus with hyperglycemia: Secondary | ICD-10-CM | POA: Diagnosis not present

## 2018-12-29 DIAGNOSIS — E559 Vitamin D deficiency, unspecified: Secondary | ICD-10-CM | POA: Diagnosis not present

## 2018-12-29 DIAGNOSIS — Z0000 Encounter for general adult medical examination without abnormal findings: Secondary | ICD-10-CM

## 2018-12-29 DIAGNOSIS — E1169 Type 2 diabetes mellitus with other specified complication: Secondary | ICD-10-CM

## 2018-12-29 DIAGNOSIS — I10 Essential (primary) hypertension: Secondary | ICD-10-CM

## 2018-12-29 DIAGNOSIS — C61 Malignant neoplasm of prostate: Secondary | ICD-10-CM

## 2018-12-29 DIAGNOSIS — E782 Mixed hyperlipidemia: Secondary | ICD-10-CM

## 2018-12-29 DIAGNOSIS — D508 Other iron deficiency anemias: Secondary | ICD-10-CM

## 2018-12-29 DIAGNOSIS — I700 Atherosclerosis of aorta: Secondary | ICD-10-CM

## 2018-12-29 LAB — URINALYSIS, COMPLETE
Bilirubin, UA: NEGATIVE
Glucose, UA: NEGATIVE
Ketones, UA: NEGATIVE
Leukocytes,UA: NEGATIVE
Nitrite, UA: NEGATIVE
Protein,UA: NEGATIVE
Specific Gravity, UA: 1.02 (ref 1.005–1.030)
Urobilinogen, Ur: 0.2 mg/dL (ref 0.2–1.0)
pH, UA: 5.5 (ref 5.0–7.5)

## 2018-12-29 LAB — MICROSCOPIC EXAMINATION
Bacteria, UA: NONE SEEN
Epithelial Cells (non renal): NONE SEEN /HPF (ref 0–10)
Renal Epithel, UA: NONE SEEN /HPF
WBC, UA: NONE SEEN /HPF (ref 0–5)

## 2018-12-29 LAB — BAYER DCA HB A1C WAIVED: HB A1C (BAYER DCA - WAIVED): 6.4 % (ref <7.0)

## 2018-12-30 LAB — CBC WITH DIFFERENTIAL/PLATELET
Basophils Absolute: 0.1 10*3/uL (ref 0.0–0.2)
Basos: 1 %
EOS (ABSOLUTE): 0.2 10*3/uL (ref 0.0–0.4)
Eos: 4 %
Hematocrit: 33.2 % — ABNORMAL LOW (ref 37.5–51.0)
Hemoglobin: 11.4 g/dL — ABNORMAL LOW (ref 13.0–17.7)
Immature Grans (Abs): 0 10*3/uL (ref 0.0–0.1)
Immature Granulocytes: 0 %
Lymphocytes Absolute: 2.1 10*3/uL (ref 0.7–3.1)
Lymphs: 36 %
MCH: 30.6 pg (ref 26.6–33.0)
MCHC: 34.3 g/dL (ref 31.5–35.7)
MCV: 89 fL (ref 79–97)
Monocytes Absolute: 0.5 10*3/uL (ref 0.1–0.9)
Monocytes: 8 %
Neutrophils Absolute: 2.9 10*3/uL (ref 1.4–7.0)
Neutrophils: 51 %
Platelets: 201 10*3/uL (ref 150–450)
RBC: 3.73 x10E6/uL — ABNORMAL LOW (ref 4.14–5.80)
RDW: 13.2 % (ref 11.6–15.4)
WBC: 5.7 10*3/uL (ref 3.4–10.8)

## 2018-12-30 LAB — MICROALBUMIN / CREATININE URINE RATIO
Creatinine, Urine: 163.3 mg/dL
Microalb/Creat Ratio: 5 mg/g creat (ref 0–29)
Microalbumin, Urine: 8.1 ug/mL

## 2018-12-30 LAB — BMP8+EGFR
BUN/Creatinine Ratio: 14 (ref 10–24)
BUN: 18 mg/dL (ref 8–27)
CO2: 22 mmol/L (ref 20–29)
Calcium: 9.1 mg/dL (ref 8.6–10.2)
Chloride: 103 mmol/L (ref 96–106)
Creatinine, Ser: 1.25 mg/dL (ref 0.76–1.27)
GFR calc Af Amer: 66 mL/min/{1.73_m2} (ref >59)
GFR calc non Af Amer: 57 mL/min/{1.73_m2} — ABNORMAL LOW (ref >59)
Glucose: 121 mg/dL — ABNORMAL HIGH (ref 65–99)
Potassium: 4.1 mmol/L (ref 3.5–5.2)
Sodium: 142 mmol/L (ref 134–144)

## 2018-12-30 LAB — LIPID PANEL
Chol/HDL Ratio: 3.7 ratio (ref 0.0–5.0)
Cholesterol, Total: 114 mg/dL (ref 100–199)
HDL: 31 mg/dL — ABNORMAL LOW (ref >39)
LDL Calculated: 55 mg/dL (ref 0–99)
Triglycerides: 142 mg/dL (ref 0–149)
VLDL Cholesterol Cal: 28 mg/dL (ref 5–40)

## 2018-12-30 LAB — HEPATIC FUNCTION PANEL
ALT: 22 IU/L (ref 0–44)
AST: 27 IU/L (ref 0–40)
Albumin: 3.7 g/dL (ref 3.7–4.7)
Alkaline Phosphatase: 88 IU/L (ref 39–117)
Bilirubin Total: 0.4 mg/dL (ref 0.0–1.2)
Bilirubin, Direct: 0.13 mg/dL (ref 0.00–0.40)
Total Protein: 6.4 g/dL (ref 6.0–8.5)

## 2018-12-30 LAB — PSA, TOTAL AND FREE
PSA, Free Pct: 20 %
PSA, Free: 0.22 ng/mL
Prostate Specific Ag, Serum: 1.1 ng/mL (ref 0.0–4.0)

## 2018-12-30 LAB — THYROID PANEL WITH TSH
Free Thyroxine Index: 2.1 (ref 1.2–4.9)
T3 Uptake Ratio: 24 % (ref 24–39)
T4, Total: 8.9 ug/dL (ref 4.5–12.0)
TSH: 1.95 u[IU]/mL (ref 0.450–4.500)

## 2018-12-30 LAB — VITAMIN D 25 HYDROXY (VIT D DEFICIENCY, FRACTURES): Vit D, 25-Hydroxy: 32 ng/mL (ref 30.0–100.0)

## 2019-01-03 ENCOUNTER — Other Ambulatory Visit: Payer: Self-pay

## 2019-01-03 NOTE — Patient Outreach (Signed)
[facility]) Care Management  01/03/2019  NASIAH LEHENBAUER 1946-01-17 [id number]   Medication Adherence call to Mr. Leam Madero HIPPA Compliant Voice message left with a call back number. Mr. Turton is showing past due under [place].   [facility] Management Direct Dial [phone]  Fax [phone] [email or web address]

## 2019-01-11 ENCOUNTER — Other Ambulatory Visit: Payer: Self-pay | Admitting: Family Medicine

## 2019-01-11 NOTE — Telephone Encounter (Signed)
Left message stating samples are ready for pick up

## 2019-01-20 ENCOUNTER — Other Ambulatory Visit: Payer: Self-pay

## 2019-01-20 ENCOUNTER — Ambulatory Visit (INDEPENDENT_AMBULATORY_CARE_PROVIDER_SITE_OTHER): Payer: Medicare Other | Admitting: *Deleted

## 2019-01-20 ENCOUNTER — Encounter: Payer: Self-pay | Admitting: *Deleted

## 2019-01-20 DIAGNOSIS — Z0000 Encounter for general adult medical examination without abnormal findings: Secondary | ICD-10-CM

## 2019-01-20 NOTE — Progress Notes (Signed)
MEDICARE ANNUAL WELLNESS VISIT  01/20/2019  Telephone Visit Disclaimer This Medicare AWV was conducted by telephone due to national recommendations for restrictions regarding the COVID-19 Pandemic (e.g. social distancing).  I verified, using two identifiers, that I am speaking with Andrew [name] or their authorized healthcare agent. I discussed the limitations, risks, security, and privacy concerns of performing an evaluation and management service by telephone and the potential availability of an in-person appointment in the future. The patient expressed understanding and agreed to proceed.   Subjective:  Andrew [name] is a 73 y.o. male patient of Stacks, Cletus Gash, MD who had a Medicare Annual Wellness Visit today via telephone. Andrew [name] is Retired and lives with their spouse. he has 2 biologic children and 1 stepchild. he reports that he is socially active and does interact with friends/family regularly. he is not physically active and enjoys watching sporting events on TV such as basketball, baseball, football and Nascar.  Patient Care Team: [name], MD as PCP - General (Family Medicine) Minus Breeding, MD as Consulting Physician (Cardiology) McKenzie, Candee Furbish, MD as Consulting Physician (Urology) Alyson Ingles Candee Furbish, MD as Consulting Physician (Urology)  Advanced Directives 01/20/2019 01/11/2018 07/02/2017 01/05/2017 09/08/2014 04/18/2014  Does Patient Have a Medical Advance Directive? No No No No No No  Would patient like information on creating a medical advance directive? No - Patient declined Yes (MAU/Ambulatory/Procedural Areas - Information given) - Yes (MAU/Ambulatory/Procedural Areas - Information given) No - patient declined information No - patient declined information    Hospital Utilization Over the Past 12 Months: # of hospitalizations or ER visits: 0 # of surgeries: 0  Review of Systems    Patient reports that his overall health is unchanged compared to last year.   Patient Reported Readings (BP, Pulse, CBG, Weight, etc) none  Review of Systems: No complaints  All other systems negative.  Pain Assessment Pain : No/denies pain     Current Medications & Allergies (verified) Allergies as of 01/20/2019   No Known Allergies     Medication List       Accurate as of January 20, 2019 11:19 AM. If you have any questions, ask your nurse or doctor.        STOP taking these medications   Fish Oil 1000 MG Cpdr Stopped by: WYATT, AMY M, RN   varenicline 0.5 MG X 11 & 1 MG X 42 tablet Commonly known as: CHANTIX PAK Stopped by: WYATT, AMY M, RN     TAKE these medications   Accu-Chek Aviva Plus test strip Generic drug: glucose blood CHECK BLOOD SUGAR  UP  TO THREE TIMES DAILY   aspirin 81 MG tablet Take 81 mg by mouth daily.   atorvastatin 40 MG tablet Commonly known as: LIPITOR Take 1 tablet (40 mg total) by mouth daily.   blood glucose meter kit and supplies Dispense based on patient and insurance preference. Use up to four times daily as directed. (FOR ICD-10 E10.9, E11.9).   cholecalciferol 1000 units tablet Commonly known as: VITAMIN D Take 1,000 Units by mouth daily.   Cinnamon 500 MG capsule Take 500 mg by mouth daily as needed (blood glucose levels).   lisinopril-hydrochlorothiazide 20-25 MG tablet Commonly known as: ZESTORETIC Take 1 tablet by mouth daily.   metFORMIN 1000 MG tablet Commonly known as: GLUCOPHAGE TAKE 1 TABLET TWICE DAILY WITH A MEAL   sitaGLIPtin 100 MG tablet Commonly known as: JANUVIA Take 1 tablet (100 mg total) by mouth daily.  tadalafil 20 MG tablet Commonly known as: CIALIS   tamsulosin 0.4 MG Caps capsule Commonly known as: FLOMAX Take 1 capsule (0.4 mg total) by mouth daily.       History (reviewed): Past Medical History:  Diagnosis Date  . Anemia   . BPH (benign prostatic hyperplasia)    Sees Dr [name]  . Cataract   . Diabetes mellitus without complication ([place])   .  Hyperlipidemia   . Hypertension   . Low serum vitamin D   . Prostate cancer ([place])    w/seed implantation and radiation   Past Surgical History:  Procedure Laterality Date  . COLONOSCOPY  2015  . PROSTATE SURGERY  2008   seed implant   Family History  Problem Relation Age of Onset  . Diabetes Mother   . Heart disease Mother   . Kidney disease Mother        DIALYSIS  . Emphysema Father   . Deep vein thrombosis Sister   . Kidney disease Sister   . Cancer Sister        lung  . Colon polyps Brother   . Hypertension Brother   . Gout Brother   . Colon cancer Neg Hx   . Esophageal cancer Neg Hx   . Rectal cancer Neg Hx   . Prostate cancer Neg Hx    Social History   Socioeconomic History  . Marital status: Married    Spouse name: Mary  . Number of children: 2  . Years of education: 4  . Highest education level: 12th grade  Occupational History  . Occupation: [name]    Comment: Retired Geologist, engineering  Social Needs  . Financial resource strain: Not hard at all  . Food insecurity    Worry: Never true    Inability: Never true  . Transportation needs    Medical: No    Non-medical: No  Tobacco Use  . Smoking status: Current Every Day Smoker    Packs/day: 1.00    Years: 55.00    Pack years: 55.00    Types: Cigarettes    Start date: 06/30/1965  . Smokeless tobacco: Never Used  . Tobacco comment: Has tried nicotine replacement gum  Substance and Sexual Activity  . Alcohol use: No  . Drug use: No  . Sexual activity: Yes  Lifestyle  . Physical activity    Days per week: 0 days    Minutes per session: 0 min  . Stress: Not at all  Relationships  . Social connections    Talks on phone: More than three times a week    Gets together: More than three times a week    Attends religious service: More than 4 times per year    Active member of club or organization: Yes    Attends meetings of clubs or organizations: More than 4 times per year    Relationship status:  Married  Other Topics Concern  . Not on file  Social History Narrative   Lives at home with wife.      Activities of Daily Living In your present state of health, do you have any difficulty performing the following activities: 01/20/2019  Hearing? N  Vision? N  Difficulty concentrating or making decisions? N  Walking or climbing stairs? N  Dressing or bathing? N  Doing errands, shopping? N  Preparing Food and eating ? N  Using the Toilet? N  In the past six months, have you accidently leaked urine? N  Do you  have problems with loss of bowel control? N  Managing your Medications? N  Managing your Finances? N  Housekeeping or managing your Housekeeping? N  Some recent data might be hidden    Patient Education/ Literacy How often do you need to have someone help you when you read instructions, pamphlets, or other written materials from your doctor or pharmacy?: 1 - Never What is the last grade level you completed in school?: 12th grade  Exercise Current Exercise Habits: The patient does not participate in regular exercise at present, Exercise limited by: None identified  Diet Patient reports consuming 3 meals a day and 2 snack(s) a day Patient reports that his primary diet is: Regular Patient reports that she does have regular access to food.   Depression Screen PHQ 2/9 Scores 01/20/2019 08/11/2018 04/06/2018 01/11/2018 12/01/2017 07/27/2017 03/10/2017  PHQ - 2 Score 0 0 0 0 0 0 0  PHQ- 9 Score - - - - - - -     Fall Risk Fall Risk  01/20/2019 08/11/2018 04/06/2018 01/11/2018 12/01/2017  Falls in the past year? 0 0 No No No  Number falls in past yr: 0 - - - -  Injury with Fall? 0 - - - -  Risk for fall due to : Other (Comment) - - - -  Risk for fall due to: Comment pt has not had any falls in the past 12 months - - - -  Follow up Falls prevention discussed - - - -  Comment discussed keeping throw rugs out of the house, adequate lighting in hallways, grab bars in restrooms - - - -      Objective:  Andrew [name] seemed alert and oriented and he participated appropriately during our telephone visit.  Blood Pressure Weight BMI  BP Readings from Last 3 Encounters:  09/01/18 115/68  08/11/18 110/66  04/06/18 119/71   Wt Readings from Last 3 Encounters:  08/11/18 219 lb (99.3 kg)  04/06/18 218 lb (98.9 kg)  01/11/18 217 lb (98.4 kg)   BMI Readings from Last 1 Encounters:  08/11/18 33.79 kg/m    *Unable to obtain current vital signs, weight, and BMI due to telephone visit type  Hearing/Vision  . Enio did not seem to have difficulty with hearing/understanding during the telephone conversation . Reports that he has not had a formal eye exam by an eye care professional within the past year . Reports that he has not had a formal hearing evaluation within the past year *Unable to fully assess hearing and vision during telephone visit type  Cognitive Function: 6CIT Screen 01/20/2019  What Year? 0 points  What month? 0 points  What time? 0 points  Count back from 20 0 points  Months in reverse 0 points  Repeat phrase 2 points  Total Score 2   (Normal:0-7, Significant for Dysfunction: >8)  Normal Cognitive Function Screening: Yes   Immunization & Health Maintenance Record Immunization History  Administered Date(s) Administered  . Influenza, High Dose Seasonal PF 05/13/2016, 04/14/2017, 04/06/2018  . Influenza,inj,Quad PF,6+ Mos 04/18/2013, 04/07/2014, 04/06/2015  . Pneumococcal Conjugate-13 02/27/2014  . Pneumococcal Polysaccharide-23 11/16/2012    Health Maintenance  Topic Date Due  . Hepatitis C Screening  04/05/1946  . TETANUS/TDAP  10/26/1964  . OPHTHALMOLOGY EXAM  08/29/2018  . INFLUENZA VACCINE  01/29/2019  . HEMOGLOBIN A1C  07/01/2019  . FOOT EXAM  08/12/2019  . COLON CANCER SCREENING ANNUAL FOBT  08/12/2019  . COLONOSCOPY  09/12/2019  . PNA vac Low  Risk Adult  Completed       Assessment  This is a routine wellness examination for Andrew [name].  Health Maintenance: Due or Overdue Health Maintenance Due  Topic Date Due  . Hepatitis C Screening  [birth year]  . TETANUS/TDAP  10/26/1964  . OPHTHALMOLOGY EXAM  08/29/2018    Andrew [name] does not need a referral for Community Assistance: Care Management:   no Social Work:    no Prescription Assistance:  no Nutrition/Diabetes Education:  no   Plan:  Personalized Goals Goals Addressed            This Visit's Progress   . DIET - INCREASE WATER INTAKE       Try to drink 6-8 glasses of water daily.      Personalized Health Maintenance & Screening Recommendations  Td vaccine Shingles  Lung Cancer Screening Recommended: yes but declined at this time (Low Dose CT Chest recommended if Age 57-80 years, 30 pack-year currently smoking OR have quit w/in past 15 years) Hepatitis C Screening recommended: yes HIV Screening recommended: no  Advanced Directives: Written information was not prepared per patient's request.  Referrals & Orders No orders of the defined types were placed in this encounter.   Follow-up Plan . Follow-up with [name], MD as planned . Consider TDAP and Shingles vaccines at your next visit with your PCP   I have personally reviewed and noted the following in the patient's chart:   . Medical and social history . Use of alcohol, tobacco or illicit drugs  . Current medications and supplements . Functional ability and status . Nutritional status . Physical activity . Advanced directives . List of other physicians . Hospitalizations, surgeries, and ER visits in previous 12 months . Vitals . Screenings to include cognitive, depression, and falls . Referrals and appointments  In addition, I have reviewed and discussed with Andrew [name] certain preventive protocols, quality metrics, and best practice recommendations. A written personalized care plan for preventive services as well as general preventive health recommendations is available  and can be mailed to the patient at his request.      Marylin Crosby, LPN  12/16/120

## 2019-01-20 NOTE — Patient Instructions (Signed)
Preventive Care 75 Years and Older, Male Preventive care refers to lifestyle choices and visits with your health care provider that can promote health and wellness. This includes:  A yearly physical exam. This is also called an annual well check.  Regular dental and eye exams.  Immunizations.  Screening for certain conditions.  Healthy lifestyle choices, such as diet and exercise. What can I expect for my preventive care visit? Physical exam Your health care provider will check:  Height and weight. These may be used to calculate body mass index (BMI), which is a measurement that tells if you are at a healthy weight.  Heart rate and blood pressure.  Your skin for abnormal spots. Counseling Your health care provider may ask you questions about:  Alcohol, tobacco, and drug use.  Emotional well-being.  Home and relationship well-being.  Sexual activity.  Eating habits.  History of falls.  Memory and ability to understand (cognition).  Work and work Statistician. What immunizations do I need?  Influenza (flu) vaccine  This is recommended every year. Tetanus, diphtheria, and pertussis (Tdap) vaccine  You may need a Td booster every 10 years. Varicella (chickenpox) vaccine  You may need this vaccine if you have not already been vaccinated. Zoster (shingles) vaccine  You may need this after age 50. Pneumococcal conjugate (PCV13) vaccine  One dose is recommended after age 24. Pneumococcal polysaccharide (PPSV23) vaccine  One dose is recommended after age 33. Measles, mumps, and rubella (MMR) vaccine  You may need at least one dose of MMR if you were born in 1957 or later. You may also need a second dose. Meningococcal conjugate (MenACWY) vaccine  You may need this if you have certain conditions. Hepatitis A vaccine  You may need this if you have certain conditions or if you travel or work in places where you may be exposed to hepatitis A. Hepatitis B vaccine   You may need this if you have certain conditions or if you travel or work in places where you may be exposed to hepatitis B. Haemophilus influenzae type b (Hib) vaccine  You may need this if you have certain conditions. You may receive vaccines as individual doses or as more than one vaccine together in one shot (combination vaccines). Talk with your health care provider about the risks and benefits of combination vaccines. What tests do I need? Blood tests  Lipid and cholesterol levels. These may be checked every 5 years, or more frequently depending on your overall health.  Hepatitis C test.  Hepatitis B test. Screening  Lung cancer screening. You may have this screening every year starting at age 74 if you have a 30-pack-year history of smoking and currently smoke or have quit within the past 15 years.  Colorectal cancer screening. All adults should have this screening starting at age 57 and continuing until age 54. Your health care provider may recommend screening at age 47 if you are at increased risk. You will have tests every 1-10 years, depending on your results and the type of screening test.  Prostate cancer screening. Recommendations will vary depending on your family history and other risks.  Diabetes screening. This is done by checking your blood sugar (glucose) after you have not eaten for a while (fasting). You may have this done every 1-3 years.  Abdominal aortic aneurysm (AAA) screening. You may need this if you are a current or former smoker.  Sexually transmitted disease (STD) testing. Follow these instructions at home: Eating and drinking  Eat  a diet that includes fresh fruits and vegetables, whole grains, lean protein, and low-fat dairy products. Limit your intake of foods with high amounts of sugar, saturated fats, and salt.  Take vitamin and mineral supplements as recommended by your health care provider.  Do not drink alcohol if your health care provider  tells you not to drink.  If you drink alcohol: ? Limit how much you have to 0-2 drinks a day. ? Be aware of how much alcohol is in your drink. In the [place] one drink equals one 12 oz bottle of beer (355 mL), one 5 oz glass of wine (148 mL), or one 1 oz glass of hard liquor (44 mL). Lifestyle  Take daily care of your teeth and gums.  Stay active. Exercise for at least 30 minutes on 5 or more days each week.  Do not use any products that contain nicotine or tobacco, such as cigarettes, e-cigarettes, and chewing tobacco. If you need help quitting, ask your health care provider.  If you are sexually active, practice safe sex. Use a condom or other form of protection to prevent STIs (sexually transmitted infections).  Talk with your health care provider about taking a low-dose aspirin or statin. What's next?  Visit your health care provider once a year for a well check visit.  Ask your health care provider how often you should have your eyes and teeth checked.  Stay up to date on all vaccines. This information is not intended to replace advice given to you by your health care provider. Make sure you discuss any questions you have with your health care provider. Document Released: 07/13/2015 Document Revised: 06/10/2018 Document Reviewed: 06/10/2018 Elsevier Patient Education  2020 Elsevier Inc.  

## 2019-02-28 ENCOUNTER — Telehealth: Payer: Self-pay | Admitting: Family Medicine

## 2019-02-28 NOTE — Telephone Encounter (Signed)
None available, pt aware 

## 2019-03-02 ENCOUNTER — Ambulatory Visit: Payer: Medicare Other | Admitting: Cardiology

## 2019-03-29 ENCOUNTER — Ambulatory Visit: Payer: Medicare Other | Admitting: Family Medicine

## 2019-03-30 ENCOUNTER — Ambulatory Visit (INDEPENDENT_AMBULATORY_CARE_PROVIDER_SITE_OTHER): Payer: Medicare Other | Admitting: Family Medicine

## 2019-03-30 ENCOUNTER — Ambulatory Visit: Payer: Medicare Other | Admitting: Cardiology

## 2019-03-30 ENCOUNTER — Encounter: Payer: Self-pay | Admitting: Family Medicine

## 2019-03-30 DIAGNOSIS — I10 Essential (primary) hypertension: Secondary | ICD-10-CM

## 2019-03-30 DIAGNOSIS — E1169 Type 2 diabetes mellitus with other specified complication: Secondary | ICD-10-CM

## 2019-03-30 DIAGNOSIS — E1165 Type 2 diabetes mellitus with hyperglycemia: Secondary | ICD-10-CM | POA: Diagnosis not present

## 2019-03-30 DIAGNOSIS — E785 Hyperlipidemia, unspecified: Secondary | ICD-10-CM

## 2019-03-30 MED ORDER — TAMSULOSIN HCL 0.4 MG PO CAPS: 0.8000 mg | ORAL_CAPSULE | Freq: Every day | ORAL | 3 refills | Status: DC

## 2019-03-30 NOTE — Progress Notes (Signed)
Subjective:    Patient ID: Andrew [name], male    DOB: Aug 08, 1945, 73 y.o.   MRN: [id number]   HPI: Andrew [name] is a 73 y.o. male presenting for presents forFollow-up of diabetes. Patient checks blood sugar at home.   around 100 fasting and under 150 postprandial Patient denies symptoms such as polyuria, polydipsia, excessive hunger, nausea No significant hypoglycemic spells noted. Medications reviewed. Pt reports taking them regularly without complication/adverse reaction being reported today.  Checking feet daily. Last eye appt was within the last two weeks. Going to [place] for Glaucoma next week.  Loss of energy every other day or so[facility] Ongoing a pretty good while.  Depression screen [facility] 2/9 01/20/2019 08/11/2018 04/06/2018 01/11/2018 12/01/2017  Decreased Interest 0 0 0 0 0  Down, Depressed, Hopeless 0 0 0 0 0  PHQ - 2 Score 0 0 0 0 0  Altered sleeping - - - - -  Tired, decreased energy - - - - -  Change in appetite - - - - -  Feeling bad or failure about yourself  - - - - -  Trouble concentrating - - - - -  Moving slowly or fidgety/restless - - - - -  Suicidal thoughts - - - - -  PHQ-9 Score - - - - -     Relevant past medical, surgical, family and social history reviewed and updated as indicated.  Interim medical history since our last visit reviewed. Allergies and medications reviewed and updated.  ROS:  Review of Systems  Constitutional: Positive for fatigue.  HENT: Negative.   Eyes: Negative for visual disturbance.  Respiratory: Negative for cough and shortness of breath.   Cardiovascular: Negative for chest pain and leg swelling.  Gastrointestinal: Negative for abdominal pain, diarrhea, nausea and vomiting.  Genitourinary: Negative for difficulty urinating.  Musculoskeletal: Negative for arthralgias and myalgias.  Skin: Negative for rash.  Neurological: Negative for headaches.  Psychiatric/Behavioral: Negative for sleep  disturbance.     Social History   Tobacco Use  Smoking Status Current Every Day Smoker  . Packs/day: 1.00  . Years: 55.00  . Pack years: 55.00  . Types: Cigarettes  . Start date: 06/30/1965  Smokeless Tobacco Never Used  Tobacco Comment   Has tried nicotine replacement gum       Objective:     Wt Readings from Last 3 Encounters:  08/11/18 219 lb (99.3 kg)  04/06/18 218 lb (98.9 kg)  01/11/18 217 lb (98.4 kg)     Exam deferred. Pt. [place] due to COVID 19. Phone visit performed.   Assessment & Plan:   1. Uncontrolled type 2 diabetes mellitus with hyperglycemia ([place])   2. Essential hypertension   3. Hyperlipidemia associated with type 2 diabetes mellitus ([place])     Meds ordered this encounter  Medications  . tamsulosin (FLOMAX) 0.4 MG CAPS capsule    Sig: Take 2 capsules (0.8 mg total) by mouth at bedtime.    Dispense:  180 capsule    Refill:  3    No orders of the defined types were placed in this encounter.     Diagnoses and all orders for this visit:  Uncontrolled type 2 diabetes mellitus with hyperglycemia ([place])  Essential hypertension  Hyperlipidemia associated with type 2 diabetes mellitus ([place])  Other orders -     tamsulosin (FLOMAX) 0.4 MG CAPS capsule; Take 2 capsules (0.8 mg total) by mouth at bedtime.  Virtual Visit via telephone Note  I discussed the limitations, risks, security and privacy concerns of performing an evaluation and management service by telephone and the availability of in person appointments. The patient was identified with two identifiers. Pt.expressed understanding and agreed to proceed. Pt. Is at home. Dr. Livia Snellen is in his office.  Follow Up Instructions:   I discussed the assessment and treatment plan with the patient. The patient was provided an opportunity to ask questions and all were answered. The patient agreed with the plan and demonstrated an understanding of the instructions.   The patient was advised to call  back or seek an in-person evaluation if the symptoms worsen or if the condition fails to improve as anticipated.   Total minutes including chart review and phone contact time: 25  Take B complex for energy.  Follow up plan: Return in about 3 months (around 06/29/2019) for diabetes in office.[name], MD [place]

## 2019-04-05 ENCOUNTER — Other Ambulatory Visit: Payer: Self-pay

## 2019-04-05 DIAGNOSIS — H40003 Preglaucoma, unspecified, bilateral: Secondary | ICD-10-CM | POA: Diagnosis not present

## 2019-04-05 DIAGNOSIS — E119 Type 2 diabetes mellitus without complications: Secondary | ICD-10-CM | POA: Diagnosis not present

## 2019-04-06 ENCOUNTER — Ambulatory Visit (INDEPENDENT_AMBULATORY_CARE_PROVIDER_SITE_OTHER): Payer: Medicare Other

## 2019-04-06 DIAGNOSIS — Z23 Encounter for immunization: Secondary | ICD-10-CM | POA: Diagnosis not present

## 2019-04-12 DIAGNOSIS — E1151 Type 2 diabetes mellitus with diabetic peripheral angiopathy without gangrene: Secondary | ICD-10-CM | POA: Diagnosis not present

## 2019-04-12 DIAGNOSIS — M79676 Pain in unspecified toe(s): Secondary | ICD-10-CM | POA: Diagnosis not present

## 2019-04-12 DIAGNOSIS — B351 Tinea unguium: Secondary | ICD-10-CM | POA: Diagnosis not present

## 2019-04-12 DIAGNOSIS — L84 Corns and callosities: Secondary | ICD-10-CM | POA: Diagnosis not present

## 2019-04-25 ENCOUNTER — Other Ambulatory Visit: Payer: Self-pay

## 2019-04-25 NOTE — Patient Outreach (Signed)
[facility]) Care Management  04/25/2019  Andrew [name] 1946-02-02 [id number]   Medication Adherence call to Andrew [name] Telephone call to Patient regarding Medication Adherence unable to reach patient left a message with patient wife. Mr. Eye is showing past due on Atorvastatin 40 mg,Metformin 1000 mg and Lisinopril/Hctz 20/25 mg under [place].   [facility] Direct Dial [phone]  Fax [phone] [email or web address]

## 2019-06-01 ENCOUNTER — Ambulatory Visit: Payer: Medicare Other | Admitting: Urology

## 2019-06-08 ENCOUNTER — Ambulatory Visit: Payer: Medicare Other | Admitting: Cardiology

## 2019-07-04 ENCOUNTER — Other Ambulatory Visit: Payer: Self-pay

## 2019-07-05 ENCOUNTER — Ambulatory Visit (INDEPENDENT_AMBULATORY_CARE_PROVIDER_SITE_OTHER): Payer: Medicare Other | Admitting: Family Medicine

## 2019-07-05 ENCOUNTER — Encounter: Payer: Self-pay | Admitting: Family Medicine

## 2019-07-05 ENCOUNTER — Other Ambulatory Visit: Payer: Self-pay | Admitting: *Deleted

## 2019-07-05 VITALS — BP 118/71 | HR 78 | Temp 95.7°F | Ht 67.5 in | Wt 215.4 lb

## 2019-07-05 DIAGNOSIS — N400 Enlarged prostate without lower urinary tract symptoms: Secondary | ICD-10-CM | POA: Diagnosis not present

## 2019-07-05 DIAGNOSIS — F5101 Primary insomnia: Secondary | ICD-10-CM

## 2019-07-05 DIAGNOSIS — E785 Hyperlipidemia, unspecified: Secondary | ICD-10-CM | POA: Diagnosis not present

## 2019-07-05 DIAGNOSIS — N529 Male erectile dysfunction, unspecified: Secondary | ICD-10-CM

## 2019-07-05 DIAGNOSIS — E1165 Type 2 diabetes mellitus with hyperglycemia: Secondary | ICD-10-CM

## 2019-07-05 DIAGNOSIS — I10 Essential (primary) hypertension: Secondary | ICD-10-CM | POA: Diagnosis not present

## 2019-07-05 DIAGNOSIS — E1169 Type 2 diabetes mellitus with other specified complication: Secondary | ICD-10-CM

## 2019-07-05 LAB — BAYER DCA HB A1C WAIVED: HB A1C (BAYER DCA - WAIVED): 6.2 % (ref <7.0)

## 2019-07-05 MED ORDER — TRAZODONE HCL 150 MG PO TABS: ORAL_TABLET | ORAL | 5 refills | Status: DC

## 2019-07-05 MED ORDER — SILDENAFIL CITRATE 20 MG PO TABS: ORAL_TABLET | ORAL | 5 refills | Status: DC

## 2019-07-05 MED ORDER — ALBUTEROL SULFATE HFA 108 (90 BASE) MCG/ACT IN AERS: 2.0000 | INHALATION_SPRAY | Freq: Four times a day (QID) | RESPIRATORY_TRACT | 2 refills | Status: DC | PRN

## 2019-07-05 MED ORDER — ATORVASTATIN CALCIUM 40 MG PO TABS: 40.0000 mg | ORAL_TABLET | Freq: Every day | ORAL | 3 refills | Status: DC

## 2019-07-05 MED ORDER — SITAGLIPTIN PHOSPHATE 100 MG PO TABS: 100.0000 mg | ORAL_TABLET | Freq: Every day | ORAL | 3 refills | Status: DC

## 2019-07-05 MED ORDER — LISINOPRIL-HYDROCHLOROTHIAZIDE 20-25 MG PO TABS: 1.0000 | ORAL_TABLET | Freq: Every day | ORAL | 3 refills | Status: DC

## 2019-07-05 MED ORDER — METFORMIN HCL 1000 MG PO TABS: ORAL_TABLET | ORAL | 3 refills | Status: DC

## 2019-07-05 NOTE — Progress Notes (Signed)
Subjective:  Patient ID: Andrew [name],  male    DOB: [birth year]  Age: 74 y.o.    CC: Diabetes (three month recheck)   HPI Andrew [name] presents for  follow-up of hypertension. Patient has no history of headache chest pain or shortness of breath or recent cough. Patient also denies symptoms of TIA such as numbness weakness lateralizing. Patient denies side effects from medication. States taking it regularly.  Patient also  in for follow-up of elevated cholesterol. Doing well without complaints on current medication. Denies side effects  including myalgia and arthralgia and nausea. Also in today for liver function testing. Currently no chest pain, shortness of breath or other cardiovascular related symptoms noted.  Follow-up of diabetes. Patient does check blood sugar at home. Readings run a little over 100 Patient denies symptoms such as excessive hunger or urinary frequency, excessive hunger, nausea No significant hypoglycemic spells noted. Medications reviewed. Pt reports taking them regularly. Pt. denies complication/adverse reaction today.    History Andrew [name] has a past medical history of Anemia, BPH (benign prostatic hyperplasia), Cataract, Diabetes mellitus without complication ([place]), Hyperlipidemia, Hypertension, Low serum vitamin D, and Prostate cancer ([place]).   He has a past surgical history that includes Prostate surgery (2008) and Colonoscopy (2015).   His family history includes Cancer in his sister; Colon polyps in his brother; Deep vein thrombosis in his sister; Diabetes in his mother; Emphysema in his father; Gout in his brother; Heart disease in his mother; Hypertension in his brother; Kidney disease in his mother and sister.He reports that he has been smoking cigarettes. He started smoking about 54 years ago. He has a 55.00 pack-year smoking history. He has never used smokeless tobacco. He reports that he does not drink alcohol or use drugs.  Current Outpatient Medications on  File Prior to Visit  Medication Sig Dispense Refill  . aspirin 81 MG tablet Take 81 mg by mouth daily.    . cholecalciferol (VITAMIN D) 1000 UNITS tablet Take 1,000 Units by mouth daily.     . Cinnamon 500 MG capsule Take 500 mg by mouth daily as needed (blood glucose levels).    . tadalafil (ADCIRCA/CIALIS) 20 MG tablet     . tamsulosin (FLOMAX) 0.4 MG CAPS capsule Take 2 capsules (0.8 mg total) by mouth at bedtime. 180 capsule 3   No current facility-administered medications on file prior to visit.    ROS Review of Systems  Constitutional: Negative.   HENT: Negative.   Eyes: Negative for visual disturbance.  Respiratory: Negative for cough and shortness of breath.   Cardiovascular: Negative for chest pain and leg swelling.  Gastrointestinal: Negative for abdominal pain, diarrhea, nausea and vomiting.  Genitourinary: Negative for difficulty urinating.  Musculoskeletal: Negative for arthralgias and myalgias.  Skin: Negative for rash.  Neurological: Negative for headaches.  Psychiatric/Behavioral: Negative for sleep disturbance.    Objective:  BP 118/71   Pulse 78   Temp (!) 95.7 F (35.4 C) (Temporal)   Ht 5' 7.5" (1.715 m)   Wt 215 lb 6.4 oz (97.7 kg)   SpO2 100%   BMI 33.24 kg/m   BP Readings from Last 3 Encounters:  07/05/19 118/71  09/01/18 115/68  08/11/18 110/66    Wt Readings from Last 3 Encounters:  07/05/19 215 lb 6.4 oz (97.7 kg)  08/11/18 219 lb (99.3 kg)  04/06/18 218 lb (98.9 kg)     Physical Exam Constitutional:      General: He is not in acute distress.  Appearance: He is well-developed.  HENT:     Head: Normocephalic and atraumatic.     Right Ear: External ear normal.     Left Ear: External ear normal.     Nose: Nose normal.  Eyes:     Conjunctiva/sclera: Conjunctivae normal.     Pupils: Pupils are equal, round, and reactive to light.  Cardiovascular:     Rate and Rhythm: Normal rate and regular rhythm.     Heart sounds: Normal heart  sounds. No murmur.  Pulmonary:     Effort: Pulmonary effort is normal. No respiratory distress.     Breath sounds: Normal breath sounds. No wheezing or rales.  Abdominal:     Palpations: Abdomen is soft.     Tenderness: There is no abdominal tenderness.  Musculoskeletal:        General: Normal range of motion.     Cervical back: Normal range of motion and neck supple.  Skin:    General: Skin is warm and dry.  Neurological:     Mental Status: He is alert and oriented to person, place, and time.     Deep Tendon Reflexes: Reflexes are normal and symmetric.  Psychiatric:        Behavior: Behavior normal.        Thought Content: Thought content normal.        Judgment: Judgment normal.       Assessment & Plan:   Andrew [name] was seen today for diabetes.  Diagnoses and all orders for this visit:  Essential hypertension -     lisinopril-hydrochlorothiazide (ZESTORETIC) 20-25 MG tablet; Take 1 tablet by mouth daily. -     CBC -     CMP -     Lipid  Uncontrolled type 2 diabetes mellitus with hyperglycemia (HCC) -     hgba1c -     Microalbumin / creatinine urine ratio -     metFORMIN (GLUCOPHAGE) 1000 MG tablet; TAKE 1 TABLET TWICE DAILY WITH A MEAL -     sitaGLIPtin (JANUVIA) 100 MG tablet; Take 1 tablet (100 mg total) by mouth daily. -     CBC -     CMP -     Lipid  Hyperlipidemia associated with type 2 diabetes mellitus (HCC) -     atorvastatin (LIPITOR) 40 MG tablet; Take 1 tablet (40 mg total) by mouth daily. -     CBC -     CMP -     Lipid  Benign prostatic hyperplasia, unspecified whether lower urinary tract symptoms present  Vasculogenic erectile dysfunction, unspecified vasculogenic erectile dysfunction type -     sildenafil (REVATIO) 20 MG tablet; Take 2-5 pills at once, orally, with each sexual encounter  Primary insomnia -     traZODone (DESYREL) 150 MG tablet; Use from 1/3 to 1 tablet nightly as needed for sleep.  Other orders -     albuterol (VENTOLIN HFA) 108  (90 Base) MCG/ACT inhaler; Inhale 2 puffs into the lungs every 6 (six) hours as needed for wheezing or shortness of breath.   I have discontinued [place] Accu-Chek Aviva Plus and blood glucose meter kit and supplies. I have also changed his lisinopril-hydrochlorothiazide. Additionally, I am having him start on sildenafil, traZODone, and albuterol. Lastly, I am having him maintain his aspirin, cholecalciferol, Cinnamon, tadalafil, tamsulosin, atorvastatin, metFORMIN, and sitaGLIPtin.  Meds ordered this encounter  Medications  . atorvastatin (LIPITOR) 40 MG tablet    Sig: Take 1 tablet (40 mg total) by mouth  daily.    Dispense:  90 tablet    Refill:  3  . lisinopril-hydrochlorothiazide (ZESTORETIC) 20-25 MG tablet    Sig: Take 1 tablet by mouth daily.    Dispense:  90 tablet    Refill:  3  . metFORMIN (GLUCOPHAGE) 1000 MG tablet    Sig: TAKE 1 TABLET TWICE DAILY WITH A MEAL    Dispense:  180 tablet    Refill:  3  . sitaGLIPtin (JANUVIA) 100 MG tablet    Sig: Take 1 tablet (100 mg total) by mouth daily.    Dispense:  90 tablet    Refill:  3  . sildenafil (REVATIO) 20 MG tablet    Sig: Take 2-5 pills at once, orally, with each sexual encounter    Dispense:  50 tablet    Refill:  5  . traZODone (DESYREL) 150 MG tablet    Sig: Use from 1/3 to 1 tablet nightly as needed for sleep.    Dispense:  30 tablet    Refill:  5  . albuterol (VENTOLIN HFA) 108 (90 Base) MCG/ACT inhaler    Sig: Inhale 2 puffs into the lungs every 6 (six) hours as needed for wheezing or shortness of breath.    Dispense:  18 g    Refill:  2     Follow-up: Return in about 3 months (around 10/03/2019).  [name], M.D.

## 2019-07-06 LAB — CBC WITH DIFFERENTIAL/PLATELET
Basophils Absolute: 0.1 10*3/uL (ref 0.0–0.2)
Basos: 1 %
EOS (ABSOLUTE): 0.2 10*3/uL (ref 0.0–0.4)
Eos: 3 %
Hematocrit: 35 % — ABNORMAL LOW (ref 37.5–51.0)
Hemoglobin: 11.9 g/dL — ABNORMAL LOW (ref 13.0–17.7)
Immature Grans (Abs): 0 10*3/uL (ref 0.0–0.1)
Immature Granulocytes: 0 %
Lymphocytes Absolute: 3 10*3/uL (ref 0.7–3.1)
Lymphs: 40 %
MCH: 30.2 pg (ref 26.6–33.0)
MCHC: 34 g/dL (ref 31.5–35.7)
MCV: 89 fL (ref 79–97)
Monocytes Absolute: 0.6 10*3/uL (ref 0.1–0.9)
Monocytes: 8 %
Neutrophils Absolute: 3.6 10*3/uL (ref 1.4–7.0)
Neutrophils: 48 %
Platelets: 237 10*3/uL (ref 150–450)
RBC: 3.94 x10E6/uL — ABNORMAL LOW (ref 4.14–5.80)
RDW: 12.9 % (ref 11.6–15.4)
WBC: 7.5 10*3/uL (ref 3.4–10.8)

## 2019-07-06 LAB — CMP14+EGFR
ALT: 21 IU/L (ref 0–44)
AST: 31 IU/L (ref 0–40)
Albumin/Globulin Ratio: 1.4 (ref 1.2–2.2)
Albumin: 4 g/dL (ref 3.7–4.7)
Alkaline Phosphatase: 97 IU/L (ref 39–117)
BUN/Creatinine Ratio: 13 (ref 10–24)
BUN: 16 mg/dL (ref 8–27)
Bilirubin Total: 0.4 mg/dL (ref 0.0–1.2)
CO2: 22 mmol/L (ref 20–29)
Calcium: 9.1 mg/dL (ref 8.6–10.2)
Chloride: 102 mmol/L (ref 96–106)
Creatinine, Ser: 1.26 mg/dL (ref 0.76–1.27)
GFR calc Af Amer: 65 mL/min/{1.73_m2} (ref >59)
GFR calc non Af Amer: 56 mL/min/{1.73_m2} — ABNORMAL LOW (ref >59)
Globulin, Total: 2.8 g/dL (ref 1.5–4.5)
Glucose: 140 mg/dL — ABNORMAL HIGH (ref 65–99)
Potassium: 4.1 mmol/L (ref 3.5–5.2)
Sodium: 139 mmol/L (ref 134–144)
Total Protein: 6.8 g/dL (ref 6.0–8.5)

## 2019-07-06 LAB — LIPID PANEL
Chol/HDL Ratio: 3.5 ratio (ref 0.0–5.0)
Cholesterol, Total: 115 mg/dL (ref 100–199)
HDL: 33 mg/dL — ABNORMAL LOW (ref >39)
LDL Chol Calc (NIH): 58 mg/dL (ref 0–99)
Triglycerides: 137 mg/dL (ref 0–149)
VLDL Cholesterol Cal: 24 mg/dL (ref 5–40)

## 2019-07-06 LAB — MICROALBUMIN / CREATININE URINE RATIO
Creatinine, Urine: 194.9 mg/dL
Microalb/Creat Ratio: 2 mg/g creat (ref 0–29)
Microalbumin, Urine: 4.3 ug/mL

## 2019-07-06 NOTE — Progress Notes (Signed)
Hello Andrew [name],  Your lab result is normal and/or stable.Some minor variations that are not significant are commonly marked abnormal, but do not represent any medical problem for you.  Best regards, Kristle Wesch, M.D.

## 2019-07-18 DIAGNOSIS — Z7189 Other specified counseling: Secondary | ICD-10-CM

## 2019-07-18 DIAGNOSIS — R9431 Abnormal electrocardiogram [ECG] [EKG]: Secondary | ICD-10-CM | POA: Insufficient documentation

## 2019-07-18 DIAGNOSIS — E118 Type 2 diabetes mellitus with unspecified complications: Secondary | ICD-10-CM | POA: Insufficient documentation

## 2019-07-18 HISTORY — DX: Other specified counseling: Z71.89

## 2019-07-18 NOTE — Progress Notes (Signed)
Cardiology Office Note   Date:  07/20/2019   ID:  Andrew, [name] March 28, 1946, MRN [id number]  PCP:  [name], MD  Cardiologist:   No primary care provider on file.   Chief Complaint  Patient presents with  . Shortness of Breath      History of Present Illness: Andrew [name] is a 74 y.o. male who returns for follow-up of shortness of breath.  He had this previously and I saw him.  He had a negative POET (Plain [employer] Exercise Treadmill) in 2018.  Since I last saw him he has done well.  He denies any new cardiovascular symptoms.  He was exercising at the [facility] but for Covid.  However, now he is not doing this.  He does his yard work in a barn in the summer.  He had no new problems with this.  He will get dyspneic with moderate exertion but he is actually lost about 20 pounds from eating less.  He has no new symptoms.  He is not having any resting shortness of breath, PND or orthopnea.  He has had no new palpitations, presyncope or syncope.  He denies any chest pain.  Unfortunately he is still smoking cigarettes.  Past Medical History:  Diagnosis Date  . Anemia   . BPH (benign prostatic hyperplasia)    Sees Dr [name]  . Cataract   . Diabetes mellitus without complication ([place])   . Hyperlipidemia   . Hypertension   . Low serum vitamin D   . Prostate cancer ([place])    w/seed implantation and radiation    Past Surgical History:  Procedure Laterality Date  . COLONOSCOPY  2015  . PROSTATE SURGERY  2008   seed implant     Current Outpatient Medications  Medication Sig Dispense Refill  . aspirin 81 MG tablet Take 81 mg by mouth daily.    Marland Kitchen atorvastatin (LIPITOR) 40 MG tablet Take 1 tablet (40 mg total) by mouth daily. 90 tablet 3  . cholecalciferol (VITAMIN D) 1000 UNITS tablet Take 1,000 Units by mouth daily.     . Cinnamon 500 MG capsule Take 500 mg by mouth daily as needed (blood glucose levels).    Marland Kitchen lisinopril-hydrochlorothiazide (ZESTORETIC) 20-25 MG tablet Take 1  tablet by mouth daily. 90 tablet 3  . metFORMIN (GLUCOPHAGE) 1000 MG tablet TAKE 1 TABLET TWICE DAILY WITH A MEAL 180 tablet 3  . sildenafil (REVATIO) 20 MG tablet Take 2-5 pills at once, orally, with each sexual encounter 50 tablet 5  . sitaGLIPtin (JANUVIA) 100 MG tablet Take 1 tablet (100 mg total) by mouth daily. 90 tablet 3  . tamsulosin (FLOMAX) 0.4 MG CAPS capsule Take 2 capsules (0.8 mg total) by mouth at bedtime. 180 capsule 3  . traZODone (DESYREL) 150 MG tablet Use from 1/3 to 1 tablet nightly as needed for sleep. 30 tablet 5  . albuterol (VENTOLIN HFA) 108 (90 Base) MCG/ACT inhaler Inhale 2 puffs into the lungs every 6 (six) hours as needed for wheezing or shortness of breath. (Patient not taking: Reported on 07/20/2019) 18 g 2  . varenicline (CHANTIX STARTING MONTH PAK) 0.5 MG X 11 & 1 MG X 42 tablet Take 1 0.5 mg tablet by mouth daily for 3 days, then increase to 1 0.5 mg tablet twice daily for 4 days, then increase to one 1 mg tablet twice daily. 53 tablet 0  . varenicline (CHANTIX) 1 MG tablet Take 1 tablet (1 mg total) by mouth  2 (two) times daily. 60 tablet 5   No current facility-administered medications for this visit.    Allergies:   Patient has no known allergies.    ROS:  Please see the history of present illness.   Otherwise, review of systems are positive for none.   All other systems are reviewed and negative.    PHYSICAL EXAM: VS:  BP 120/70   Pulse 76   Ht 5\' 9"  (1.753 m)   Wt 213 lb (96.6 kg)   BMI 31.45 kg/m  , BMI Body mass index is 31.45 kg/m. GENERAL:  Well appearing HEENT:  Pupils equal round and reactive, fundi not visualized, oral mucosa unremarkable NECK:  No jugular venous distention, waveform within normal limits, carotid upstroke brisk and symmetric, no bruits, no thyromegaly LYMPHATICS:  No cervical, inguinal adenopathy LUNGS:  Clear to auscultation bilaterally BACK:  No CVA tenderness CHEST:  Unremarkable HEART:  PMI not displaced or  sustained,S1 and S2 within normal limits, no S3, no S4, no clicks, no rubs, no murmurs ABD:  Flat, positive bowel sounds normal in frequency in pitch, no bruits, no rebound, no guarding, no midline pulsatile mass, no hepatomegaly, no splenomegaly EXT:  2 plus pulses throughout, no edema, no cyanosis no clubbing SKIN:  No rashes no nodules NEURO:  Cranial nerves II through XII grossly intact, motor grossly intact throughout PSYCH:  Cognitively intact, oriented to person place and time    EKG:  EKG is ordered today. The ekg ordered today demonstrates sinus rhythm, rate 76, axis within normal limits, intervals within normal limits, no acute ST-T wave changes.   Recent Labs: 12/29/2018: TSH 1.950 07/05/2019: ALT 21; BUN 16; Creatinine, Ser 1.26; Hemoglobin 11.9; Platelets 237; Potassium 4.1; Sodium 139    Lipid Panel    Component Value Date/Time   CHOL 115 07/05/2019 0931   CHOL 144 11/18/2012 0844   TRIG 137 07/05/2019 0931   TRIG 142 01/03/2016 0800   TRIG 161 (H) 11/18/2012 0844   HDL 33 (L) 07/05/2019 0931   HDL 31 (L) 01/03/2016 0800   HDL 27 (L) 11/18/2012 0844   CHOLHDL 3.5 07/05/2019 0931   LDLCALC 58 07/05/2019 0931   LDLCALC 45 03/08/2014 0810   LDLCALC 85 11/18/2012 0844      Wt Readings from Last 3 Encounters:  07/20/19 213 lb (96.6 kg)  07/05/19 215 lb 6.4 oz (97.7 kg)  08/11/18 219 lb (99.3 kg)      Other studies Reviewed: Additional studies/ records that were reviewed today include: Labs. Review of the above records demonstrates:  Please see elsewhere in the note.     ASSESSMENT AND PLAN:  SOB: This is at baseline.  He has no increased symptoms compared to previous.  No change in therapy.  HTN:   His blood pressure is well controlled.  He will continue the meds as listed.   HYPERLIPIDEMIA:  His LDL was 55 and HDL of 33.  No change in therapy.  DM:  A1c was A1c is 6.2 which is lower than it used to be at 7.1.  No change in therapy.  TOBACCO ABUSE:    We talked about this for quite a while and he agrees to consider and I will give him a prescription for Chantix.  COVID EDUCATION: We talked about the vaccine and he will consider signing up for this.  He has been called already.  Current medicines are reviewed at length with the patient today.  The patient does not have concerns regarding  medicines.  The following changes have been made:  no change  Labs/ tests ordered today include: None  Orders Placed This Encounter  Procedures  . EKG 12-Lead     Disposition:   FU with me as needed.     Signed, Minus Breeding, MD  07/20/2019 11:00 AM    [place]

## 2019-07-20 ENCOUNTER — Ambulatory Visit (INDEPENDENT_AMBULATORY_CARE_PROVIDER_SITE_OTHER): Payer: Medicare Other | Admitting: Cardiology

## 2019-07-20 ENCOUNTER — Encounter: Payer: Self-pay | Admitting: Cardiology

## 2019-07-20 ENCOUNTER — Other Ambulatory Visit: Payer: Self-pay

## 2019-07-20 VITALS — BP 120/70 | HR 76 | Ht 69.0 in | Wt 213.0 lb

## 2019-07-20 DIAGNOSIS — I10 Essential (primary) hypertension: Secondary | ICD-10-CM | POA: Diagnosis not present

## 2019-07-20 DIAGNOSIS — E118 Type 2 diabetes mellitus with unspecified complications: Secondary | ICD-10-CM

## 2019-07-20 DIAGNOSIS — R9431 Abnormal electrocardiogram [ECG] [EKG]: Secondary | ICD-10-CM

## 2019-07-20 DIAGNOSIS — E785 Hyperlipidemia, unspecified: Secondary | ICD-10-CM | POA: Diagnosis not present

## 2019-07-20 DIAGNOSIS — R0602 Shortness of breath: Secondary | ICD-10-CM

## 2019-07-20 DIAGNOSIS — Z7189 Other specified counseling: Secondary | ICD-10-CM

## 2019-07-20 MED ORDER — VARENICLINE TARTRATE 1 MG PO TABS: 1.0000 mg | ORAL_TABLET | Freq: Two times a day (BID) | ORAL | 5 refills | Status: DC

## 2019-07-20 MED ORDER — CHANTIX STARTING MONTH PAK 0.5 MG X 11 & 1 MG X 42 PO TABS: ORAL_TABLET | ORAL | 0 refills | Status: DC

## 2019-07-20 NOTE — Patient Instructions (Addendum)
Medication Instructions:  Start Chantix as directed. Continue all other medications as listed.  *If you need a refill on your cardiac medications before your next appointment, please call your pharmacy*  Follow-Up: Follow up as needed with Dr Percival Spanish.  Thank you for choosing [facility]!!    Varenicline oral tablets What is this medicine? VARENICLINE (var e NI kleen) is used to help people quit smoking. It is used with a patient support program recommended by your physician. This medicine may be used for other purposes; ask your health care provider or pharmacist if you have questions. COMMON BRAND NAME(S): Chantix What should I tell my health care provider before I take this medicine? They need to know if you have any of these conditions:  heart disease  if you often drink alcohol  kidney disease  mental illness  on hemodialysis  seizures  history of stroke  suicidal thoughts, plans, or attempt; a previous suicide attempt by you or a family member  an unusual or allergic reaction to varenicline, other medicines, foods, dyes, or preservatives  pregnant or trying to get pregnant  breast-feeding How should I use this medicine? Take this medicine by mouth after eating. Take with a full glass of water. Follow the directions on the prescription label. Take your doses at regular intervals. Do not take your medicine more often than directed. There are 3 ways you can use this medicine to help you quit smoking; talk to your health care professional to decide which plan is right for you: 1) you can choose a quit date and start this medicine 1 week before the quit date, or, 2) you can start taking this medicine before you choose a quit date, and then pick a quit date between day 8 and 35 days of treatment, or, 3) if you are not sure that you are able or willing to quit smoking right away, start taking this medicine and slowly decrease the amount you smoke as directed by  your health care professional with the goal of being cigarette-free by week 12 of treatment. Stick to your plan; ask about support groups or other ways to help you remain cigarette-free. If you are motivated to quit smoking and did not succeed during a previous attempt with this medicine for reasons other than side effects, or if you returned to smoking after this treatment, speak with your health care professional about whether another course of this medicine may be right for you. A special MedGuide will be given to you by the pharmacist with each prescription and refill. Be sure to read this information carefully each time. Talk to your pediatrician regarding the use of this medicine in children. This medicine is not approved for use in children. Overdosage: If you think you have taken too much of this medicine contact a poison control center or emergency room at once. NOTE: This medicine is only for you. Do not share this medicine with others. What if I miss a dose? If you miss a dose, take it as soon as you can. If it is almost time for your next dose, take only that dose. Do not take double or extra doses. What may interact with this medicine?  alcohol  insulin  other medicines used to help people quit smoking  theophylline  warfarin This list may not describe all possible interactions. Give your health care provider a list of all the medicines, herbs, non-prescription drugs, or dietary supplements you use. Also tell them if you smoke, drink alcohol,  or use illegal drugs. Some items may interact with your medicine. What should I watch for while using this medicine? It is okay if you do not succeed at your attempt to quit and have a cigarette. You can still continue your quit attempt and keep using this medicine as directed. Just throw away your cigarettes and get back to your quit plan. Talk to your health care provider before using other treatments to quit smoking. Using this medicine  with other treatments to quit smoking may increase the risk for side effects compared to using a treatment alone. You may get drowsy or dizzy. Do not drive, use machinery, or do anything that needs mental alertness until you know how this medicine affects you. Do not stand or sit up quickly, especially if you are an older patient. This reduces the risk of dizzy or fainting spells. Decrease the number of alcoholic beverages that you drink during treatment with this medicine until you know if this medicine affects your ability to tolerate alcohol. Some people have experienced increased drunkenness (intoxication), unusual or sometimes aggressive behavior, or no memory of things that have happened (amnesia) during treatment with this medicine. Sleepwalking can happen during treatment with this medicine, and can sometimes lead to behavior that is harmful to you, other people, or property. Stop taking this medicine and tell your doctor if you start sleepwalking or have other unusual sleep-related activity. After taking this medicine, you may get up out of bed and do an activity that you do not know you are doing. The next morning, you may have no memory of this. Activities include driving a car ("sleep-driving"), making and eating food, talking on the phone, sexual activity, and sleep-walking. Serious injuries have occurred. Stop the medicine and call your doctor right away if you find out you have done any of these activities. Do not take this medicine if you have used alcohol that evening. Do not take it if you have taken another medicine for sleep. The risk of doing these sleep-related activities is higher. Patients and their families should watch out for new or worsening depression or thoughts of suicide. Also watch out for sudden changes in feelings such as feeling anxious, agitated, panicky, irritable, hostile, aggressive, impulsive, severely restless, overly excited and hyperactive, or not being able to sleep.  If this happens, call your health care professional. If you have diabetes and you quit smoking, the effects of insulin may be increased and you may need to reduce your insulin dose. Check with your doctor or health care professional about how you should adjust your insulin dose. What side effects may I notice from receiving this medicine? Side effects that you should report to your doctor or health care professional as soon as possible:  allergic reactions like skin rash, itching or hives, swelling of the face, lips, tongue, or throat  acting aggressive, being angry or violent, or acting on dangerous impulses  breathing problems  changes in emotions or moods  chest pain or chest tightness  feeling faint or lightheaded, falls  hallucination, loss of contact with reality  mouth sores  redness, blistering, peeling or loosening of the skin, including inside the mouth  signs and symptoms of a stroke like changes in vision; confusion; trouble speaking or understanding; severe headaches; sudden numbness or weakness of the face, arm or leg; trouble walking; dizziness; loss of balance or coordination  seizures  sleepwalking  suicidal thoughts or other mood changes Side effects that usually do not require medical attention (  report to your doctor or health care professional if they continue or are bothersome):  constipation  gas  headache  nausea, vomiting  strange dreams  trouble sleeping This list may not describe all possible side effects. Call your doctor for medical advice about side effects. You may report side effects to FDA at 1-800-FDA-1088. Where should I keep my medicine? Keep out of the reach of children. Store at room temperature between 15 and 30 degrees C (59 and 86 degrees F). Throw away any unused medicine after the expiration date. NOTE: This sheet is a summary. It may not cover all possible information. If you have questions about this medicine, talk to your  doctor, pharmacist, or health care provider.  2020 Elsevier/Gold Standard (2018-06-04 14:27:36)

## 2019-08-09 ENCOUNTER — Encounter: Payer: Self-pay | Admitting: Gastroenterology

## 2019-08-10 ENCOUNTER — Ambulatory Visit (INDEPENDENT_AMBULATORY_CARE_PROVIDER_SITE_OTHER): Payer: Medicare Other | Admitting: Urology

## 2019-08-10 ENCOUNTER — Other Ambulatory Visit: Payer: Self-pay | Admitting: Urology

## 2019-08-10 ENCOUNTER — Encounter: Payer: Self-pay | Admitting: Urology

## 2019-08-10 ENCOUNTER — Other Ambulatory Visit: Payer: Self-pay

## 2019-08-10 VITALS — BP 111/44 | HR 80 | Temp 97.9°F | Ht 68.5 in | Wt 214.0 lb

## 2019-08-10 DIAGNOSIS — N401 Enlarged prostate with lower urinary tract symptoms: Secondary | ICD-10-CM | POA: Diagnosis not present

## 2019-08-10 DIAGNOSIS — N5201 Erectile dysfunction due to arterial insufficiency: Secondary | ICD-10-CM | POA: Diagnosis not present

## 2019-08-10 DIAGNOSIS — C775 Secondary and unspecified malignant neoplasm of intrapelvic lymph nodes: Secondary | ICD-10-CM | POA: Diagnosis not present

## 2019-08-10 DIAGNOSIS — N138 Other obstructive and reflux uropathy: Secondary | ICD-10-CM

## 2019-08-10 DIAGNOSIS — C61 Malignant neoplasm of prostate: Secondary | ICD-10-CM | POA: Diagnosis not present

## 2019-08-10 DIAGNOSIS — R351 Nocturia: Secondary | ICD-10-CM | POA: Diagnosis not present

## 2019-08-10 LAB — PSA: PSA: 1.6 ng/mL (ref <=4.0)

## 2019-08-10 MED ORDER — TADALAFIL 20 MG PO TABS: 20.0000 mg | ORAL_TABLET | ORAL | 0 refills | Status: DC | PRN

## 2019-08-10 MED ORDER — TAMSULOSIN HCL 0.4 MG PO CAPS: 0.8000 mg | ORAL_CAPSULE | Freq: Every day | ORAL | 3 refills | Status: DC

## 2019-08-10 MED ORDER — LEUPROLIDE ACETATE (6 MONTH) 45 MG IM KIT
45.0000 mg | PACK | Freq: Once | INTRAMUSCULAR | Status: AC
  Administered 2019-08-10: 11:00:00 45 mg via INTRAMUSCULAR

## 2019-08-10 NOTE — Progress Notes (Signed)
08/10/2019 10:39 AM   Andrew [name] 1945/11/30 [id number]  Referring provider: [name], MD [place],  [place] [address]  Nocturia  HPI: Mr Corns is a 74yo here for followup for metastatic prostate cancer BPH with nocturia and ED. Last PSA 0.9 6 months ago on lupron. Mild hot flashes. No bone pain. Nocturia is stable at 0-1x on flomax 0.8mg  daily. NO worsening LUTS. ED is stable and patient takes tadalafil prn for ED with mixed results.    PMH: Past Medical History:  Diagnosis Date  . Anemia   . BPH (benign prostatic hyperplasia)    Sees Dr [name]  . Cataract   . Diabetes mellitus without complication ([place])   . Hyperlipidemia   . Hypertension   . Low serum vitamin D   . Prostate cancer ([place])    w/seed implantation and radiation    Surgical History: Past Surgical History:  Procedure Laterality Date  . COLONOSCOPY  2015  . PROSTATE SURGERY  2008   seed implant    Home Medications:  Allergies as of 08/10/2019   No Known Allergies     Medication List       Accurate as of August 10, 2019 10:39 AM. If you have any questions, ask your nurse or doctor.        albuterol 108 (90 Base) MCG/ACT inhaler Commonly known as: VENTOLIN HFA Inhale 2 puffs into the lungs every 6 (six) hours as needed for wheezing or shortness of breath.   aspirin 81 MG tablet Take 81 mg by mouth daily.   atorvastatin 40 MG tablet Commonly known as: LIPITOR Take 1 tablet (40 mg total) by mouth daily.   Chantix Starting Month Pak 0.5 MG X 11 & 1 MG X 42 tablet Generic drug: varenicline Take 1 0.5 mg tablet by mouth daily for 3 days, then increase to 1 0.5 mg tablet twice daily for 4 days, then increase to one 1 mg tablet twice daily.   varenicline 1 MG tablet Commonly known as: CHANTIX Take 1 tablet (1 mg total) by mouth 2 (two) times daily.   cholecalciferol 1000 units tablet Commonly known as: VITAMIN D Take 1,000 Units by mouth daily.   Cinnamon 500 MG  capsule Take 500 mg by mouth daily as needed (blood glucose levels).   lisinopril-hydrochlorothiazide 20-25 MG tablet Commonly known as: ZESTORETIC Take 1 tablet by mouth daily.   metFORMIN 1000 MG tablet Commonly known as: GLUCOPHAGE TAKE 1 TABLET TWICE DAILY WITH A MEAL   sildenafil 20 MG tablet Commonly known as: REVATIO Take 2-5 pills at once, orally, with each sexual encounter   sitaGLIPtin 100 MG tablet Commonly known as: JANUVIA Take 1 tablet (100 mg total) by mouth daily.   tamsulosin 0.4 MG Caps capsule Commonly known as: FLOMAX Take 2 capsules (0.8 mg total) by mouth at bedtime.   traZODone 150 MG tablet Commonly known as: DESYREL Use from 1/3 to 1 tablet nightly as needed for sleep.       Allergies: No Known Allergies  Family History: Family History  Problem Relation Age of Onset  . Diabetes Mother   . Heart disease Mother   . Kidney disease Mother        DIALYSIS  . Emphysema Father   . Deep vein thrombosis Sister   . Kidney disease Sister   . Lung cancer Sister   . Colon polyps Brother   . Hypertension Brother   . Gout Brother   . Colon  cancer Neg Hx   . Esophageal cancer Neg Hx   . Rectal cancer Neg Hx   . Prostate cancer Neg Hx     Social History:  reports that he has been smoking cigarettes. He started smoking about 54 years ago. He has a 55.00 pack-year smoking history. He has never used smokeless tobacco. He reports that he does not drink alcohol or use drugs.  ROS: All other review of systems were reviewed and are negative except what is noted above in HPI  Physical Exam: BP (!) 111/44   Pulse 80   Temp 97.9 F (36.6 C)   Ht 5' 8.5" (1.74 m)   Wt 214 lb (97.1 kg)   BMI 32.07 kg/m   Constitutional:  Alert and oriented, No acute distress. HEENT: [place] AT, moist mucus membranes.  Trachea midline, no masses. Cardiovascular: No clubbing, cyanosis, or edema. Respiratory: Normal respiratory effort, no increased work of breathing. GI:  Abdomen is soft, nontender, nondistended, no abdominal masses GU: No CVA tenderness Lymph: No cervical or inguinal lymphadenopathy. Skin: No rashes, bruises or suspicious lesions. Neurologic: Grossly intact, no focal deficits, moving all 4 extremities. Psychiatric: Normal mood and affect.  Laboratory Data: Lab Results  Component Value Date   WBC 7.5 07/05/2019   HGB 11.9 (L) 07/05/2019   HCT 35.0 (L) 07/05/2019   MCV 89 07/05/2019   PLT 237 07/05/2019    Lab Results  Component Value Date   CREATININE 1.26 07/05/2019    No results found for: PSA  No results found for: TESTOSTERONE  Lab Results  Component Value Date   HGBA1C 6.2 07/05/2019    Urinalysis    Component Value Date/Time   COLORURINE YELLOW 07/02/2017 0410   APPEARANCEUR Clear 12/29/2018 0829   LABSPEC 1.018 07/02/2017 0410   PHURINE 7.0 07/02/2017 0410   GLUCOSEU Negative 12/29/2018 0829   HGBUR NEGATIVE 07/02/2017 0410   BILIRUBINUR Negative 12/29/2018 0829   KETONESUR NEGATIVE 07/02/2017 0410   PROTEINUR Negative 12/29/2018 0829   PROTEINUR NEGATIVE 07/02/2017 0410   UROBILINOGEN negative 07/19/2014 1002   NITRITE Negative 12/29/2018 0829   NITRITE NEGATIVE 07/02/2017 0410   LEUKOCYTESUR Negative 12/29/2018 0829    Lab Results  Component Value Date   LABMICR 4.3 07/05/2019   WBCUA None seen 12/29/2018   RBCUA None seen 08/11/2018   LABEPIT None seen 12/29/2018   MUCUS neg 06/21/2013   BACTERIA None seen 12/29/2018    Pertinent Imaging:  No results found for this or any previous visit. No results found for this or any previous visit. No results found for this or any previous visit. No results found for this or any previous visit. No results found for this or any previous visit. No results found for this or any previous visit. No results found for this or any previous visit. No results found for this or any previous visit.  Assessment & Plan:    1. Prostate cancer metastatic to  intrapelvic lymph node (HCC) PSa today, will call with results -lupron 45mg  today -RCT 6 months with PSA  2. Benign prostatic hyperplasia with urinary obstruction Continue flomax 0.8mg  daily  3. Nocturia Continue flomax 0.8mg  daily  4. Erectile dysfunction due to arterial insufficiency Tadalafil 20mg  prn   No follow-ups on file.  Nicolette Bang, MD  [facility] [place]

## 2019-08-10 NOTE — Patient Instructions (Signed)
Prostate Cancer  The prostate is a male gland that helps make semen. Prostate cancer is when abnormal cells grow in this gland. Follow these instructions at home:  Take over-the-counter and prescription medicines only as told by your doctor.  Eat a healthy diet.  Get plenty of sleep.  Ask your doctor for help to find a support group for men with prostate cancer.  Keep all follow-up visits as told by your doctor. This is important.  If you have to go to the hospital, let your cancer doctor (oncologist) know.  Touch, hold, hug, and caress your partner to continue to show sexual feelings. Contact a doctor if:  You have trouble peeing (urinating).  You have blood in your pee (urine).  You have pain in your hips, back, or chest. Get help right away if:  You have weakness in your legs.  You lose feeling (have numbness) in your legs.  You cannot control your pee or your poop (stool).  You have trouble breathing.  You have sudden pain in your chest.  You have chills or a fever. Summary  The prostate is a male gland that helps make semen. Prostate cancer is when abnormal cells grow in this gland.  Ask your doctor for help to find a support group for men with prostate cancer.  Contact a doctor if you have problems peeing or have any new pain that you did not have before. This information is not intended to replace advice given to you by your health care provider. Make sure you discuss any questions you have with your health care provider. Document Revised: 05/29/2017 Document Reviewed: 02/25/2016 Elsevier Patient Education  2020 Elsevier Inc.  

## 2019-08-10 NOTE — Progress Notes (Signed)

## 2019-08-16 DIAGNOSIS — B351 Tinea unguium: Secondary | ICD-10-CM | POA: Diagnosis not present

## 2019-08-16 DIAGNOSIS — M79676 Pain in unspecified toe(s): Secondary | ICD-10-CM | POA: Diagnosis not present

## 2019-08-16 DIAGNOSIS — L84 Corns and callosities: Secondary | ICD-10-CM | POA: Diagnosis not present

## 2019-08-16 DIAGNOSIS — E1151 Type 2 diabetes mellitus with diabetic peripheral angiopathy without gangrene: Secondary | ICD-10-CM | POA: Diagnosis not present

## 2019-08-26 ENCOUNTER — Telehealth: Payer: Self-pay | Admitting: Family Medicine

## 2019-08-26 NOTE — Chronic Care Management (AMB) (Signed)
Chronic Care Management   Note  08/26/2019 Name: RAGHAV VERRILLI MRN: [id number] DOB: 09-Jun-1946  DERVIN VORE is a 74 y.o. year old male who is a primary care patient of Stacks, Cletus Gash, MD. I reached out to [name] by phone today in response to a referral sent by Mr. Maahir Horst [facility] health plan.     Mr. Wearing was given information about Chronic Care Management services today including:  1. CCM service includes personalized support from designated clinical staff supervised by his physician, including individualized plan of care and coordination with other care providers 2. 24/7 contact phone numbers for assistance for urgent and routine care needs. 3. Service will only be billed when office clinical staff spend 20 minutes or more in a month to coordinate care. 4. Only one practitioner may furnish and bill the service in a calendar month. 5. The patient may stop CCM services at any time (effective at the end of the month) by phone call to the office staff. 6. The patient will be responsible for cost sharing (co-pay) of up to 20% of the service fee (after annual deductible is met).  Patient agreed to services and verbal consent obtained.   Follow up plan: Telephone appointment with care management team member scheduled for: 10/12/2019  Noreene Larsson, [place], [place], [place] [address] Direct Dial: [phone] [email or web address] Website: [email or web address]

## 2019-09-02 ENCOUNTER — Other Ambulatory Visit: Payer: Self-pay | Admitting: Family Medicine

## 2019-09-02 DIAGNOSIS — E785 Hyperlipidemia, unspecified: Secondary | ICD-10-CM

## 2019-09-02 DIAGNOSIS — E1169 Type 2 diabetes mellitus with other specified complication: Secondary | ICD-10-CM

## 2019-09-02 DIAGNOSIS — I10 Essential (primary) hypertension: Secondary | ICD-10-CM

## 2019-09-02 DIAGNOSIS — E1165 Type 2 diabetes mellitus with hyperglycemia: Secondary | ICD-10-CM

## 2019-09-13 ENCOUNTER — Encounter: Payer: Self-pay | Admitting: Internal Medicine

## 2019-10-03 ENCOUNTER — Ambulatory Visit: Payer: Medicare Other | Admitting: Family Medicine

## 2019-10-11 ENCOUNTER — Other Ambulatory Visit: Payer: Self-pay

## 2019-10-11 ENCOUNTER — Ambulatory Visit (INDEPENDENT_AMBULATORY_CARE_PROVIDER_SITE_OTHER): Payer: Medicare Other | Admitting: Family Medicine

## 2019-10-11 ENCOUNTER — Encounter: Payer: Self-pay | Admitting: Family Medicine

## 2019-10-11 VITALS — BP 127/71 | HR 81 | Temp 97.8°F | Resp 20 | Ht 68.5 in | Wt 215.2 lb

## 2019-10-11 DIAGNOSIS — E1169 Type 2 diabetes mellitus with other specified complication: Secondary | ICD-10-CM | POA: Diagnosis not present

## 2019-10-11 DIAGNOSIS — I10 Essential (primary) hypertension: Secondary | ICD-10-CM

## 2019-10-11 DIAGNOSIS — E1165 Type 2 diabetes mellitus with hyperglycemia: Secondary | ICD-10-CM

## 2019-10-11 DIAGNOSIS — E785 Hyperlipidemia, unspecified: Secondary | ICD-10-CM | POA: Diagnosis not present

## 2019-10-11 LAB — BAYER DCA HB A1C WAIVED: HB A1C (BAYER DCA - WAIVED): 7.1 % — ABNORMAL HIGH (ref <7.0)

## 2019-10-11 NOTE — Progress Notes (Signed)
Subjective:  Patient ID: Andrew [name],  male    DOB: [birth year]  Age: 74 y.o.    CC: Medical Management of Chronic Issues   HPI KRISS ISHLER presents for  follow-up of hypertension. Patient has no history of headache chest pain or shortness of breath or recent cough. Patient also denies symptoms of TIA such as numbness weakness lateralizing. Patient denies side effects from medication. States taking it regularly.  Patient also  in for follow-up of elevated cholesterol. Doing well without complaints on current medication. Denies side effects  including myalgia and arthralgia and nausea. Also in today for liver function testing. Currently no chest pain, shortness of breath or other cardiovascular related symptoms noted.  Follow-up of diabetes. Patient does check blood sugar at home. Readings run between 110 and 120 fasting. 150 PP Patient denies symptoms such as excessive hunger or urinary frequency, excessive hunger, nausea No significant hypoglycemic spells noted. Medications reviewed. Pt reports taking them regularly. Pt. denies complication/adverse reaction today.    History Alquan has a past medical history of Anemia, BPH (benign prostatic hyperplasia), Cataract, Diabetes mellitus without complication ([place]), Hyperlipidemia, Hypertension, Low serum vitamin D, and Prostate cancer ([place]).   He has a past surgical history that includes Prostate surgery (2008) and Colonoscopy (2015).   His family history includes Colon polyps in his brother; Deep vein thrombosis in his sister; Diabetes in his mother; Emphysema in his father; Gout in his brother; Heart disease in his mother; Hypertension in his brother; Kidney disease in his mother and sister; Lung cancer in his sister.He reports that he has been smoking cigarettes. He started smoking about 54 years ago. He has a 55.00 pack-year smoking history. He has never used smokeless tobacco. He reports that he does not drink alcohol or use  drugs.  Current Outpatient Medications on File Prior to Visit  Medication Sig Dispense Refill  . aspirin 81 MG tablet Take 81 mg by mouth daily.    Marland Kitchen atorvastatin (LIPITOR) 40 MG tablet TAKE 1 TABLET BY MOUTH  DAILY 90 tablet 0  . cholecalciferol (VITAMIN D) 1000 UNITS tablet Take 1,000 Units by mouth daily.     Marland Kitchen lisinopril-hydrochlorothiazide (ZESTORETIC) 20-25 MG tablet TAKE 1 TABLET BY MOUTH  DAILY 90 tablet 0  . metFORMIN (GLUCOPHAGE) 1000 MG tablet TAKE 1 TABLET BY MOUTH  TWICE DAILY WITH MEALS 180 tablet 0  . sitaGLIPtin (JANUVIA) 100 MG tablet Take 1 tablet (100 mg total) by mouth daily. 90 tablet 3  . tamsulosin (FLOMAX) 0.4 MG CAPS capsule TAKE 1 CAPSULE BY MOUTH  DAILY 90 capsule 0  . traZODone (DESYREL) 150 MG tablet Use from 1/3 to 1 tablet nightly as needed for sleep. 30 tablet 5  . albuterol (VENTOLIN HFA) 108 (90 Base) MCG/ACT inhaler Inhale 2 puffs into the lungs every 6 (six) hours as needed for wheezing or shortness of breath. (Patient not taking: Reported on 10/11/2019) 18 g 2  . Cinnamon 500 MG capsule Take 500 mg by mouth daily as needed (blood glucose levels).    . varenicline (CHANTIX STARTING MONTH PAK) 0.5 MG X 11 & 1 MG X 42 tablet Take 1 0.5 mg tablet by mouth daily for 3 days, then increase to 1 0.5 mg tablet twice daily for 4 days, then increase to one 1 mg tablet twice daily. (Patient not taking: Reported on 10/11/2019) 53 tablet 0  . varenicline (CHANTIX) 1 MG tablet Take 1 tablet (1 mg total) by mouth 2 (two) times daily. (  Patient not taking: Reported on 10/11/2019) 60 tablet 5   No current facility-administered medications on file prior to visit.    ROS Review of Systems  Constitutional: Negative.   HENT: Negative.   Eyes: Negative for visual disturbance.  Respiratory: Negative for cough and shortness of breath.   Cardiovascular: Negative for chest pain and leg swelling.  Gastrointestinal: Negative for abdominal pain, diarrhea, nausea and vomiting.   Genitourinary: Negative for difficulty urinating.  Musculoskeletal: Negative for arthralgias and myalgias.  Skin: Negative for rash.  Neurological: Negative for headaches.  Psychiatric/Behavioral: Negative for sleep disturbance.    Objective:  BP 127/71   Pulse 81   Temp 97.8 F (36.6 C) (Oral)   Resp 20   Ht 5' 8.5" (1.74 m)   Wt 215 lb 4 oz (97.6 kg)   SpO2 98%   BMI 32.25 kg/m   BP Readings from Last 3 Encounters:  10/11/19 127/71  08/10/19 (!) 111/44  07/20/19 120/70    Wt Readings from Last 3 Encounters:  10/11/19 215 lb 4 oz (97.6 kg)  08/10/19 214 lb (97.1 kg)  07/20/19 213 lb (96.6 kg)     Physical Exam Constitutional:      General: He is not in acute distress.    Appearance: He is well-developed.  HENT:     Head: Normocephalic and atraumatic.     Right Ear: External ear normal.     Left Ear: External ear normal.     Nose: Nose normal.  Eyes:     Conjunctiva/sclera: Conjunctivae normal.     Pupils: Pupils are equal, round, and reactive to light.  Cardiovascular:     Rate and Rhythm: Normal rate and regular rhythm.     Heart sounds: Normal heart sounds. No murmur.  Pulmonary:     Effort: Pulmonary effort is normal. No respiratory distress.     Breath sounds: Normal breath sounds. No wheezing or rales.  Abdominal:     Palpations: Abdomen is soft.     Tenderness: There is no abdominal tenderness.  Musculoskeletal:        General: Normal range of motion.     Cervical back: Normal range of motion and neck supple.  Skin:    General: Skin is warm and dry.  Neurological:     Mental Status: He is alert and oriented to person, place, and time.     Deep Tendon Reflexes: Reflexes are normal and symmetric.  Psychiatric:        Behavior: Behavior normal.        Thought Content: Thought content normal.        Judgment: Judgment normal.     Diabetic Foot Exam - Simple   Simple Foot Form Diabetic Foot exam was performed with the following findings: Yes  10/11/2019 11:18 AM  Visual Inspection No deformities, no ulcerations, no other skin breakdown bilaterally: Yes Sensation Testing Intact to touch and monofilament testing bilaterally: Yes Pulse Check Posterior Tibialis and Dorsalis pulse intact bilaterally: Yes Comments       Assessment & Plan:   Verbon was seen today for medical management of chronic issues.  Diagnoses and all orders for this visit:  Uncontrolled type 2 diabetes mellitus with hyperglycemia ([place]) -     CBC with Differential/Platelet -     CMP14+EGFR -     Lipid panel -     Bayer DCA Hb A1c Waived  Essential hypertension -     CBC with Differential/Platelet -     CMP14+EGFR -  Lipid panel -     Bayer DCA Hb A1c Waived  Hyperlipidemia associated with type 2 diabetes mellitus ([place]) -     CBC with Differential/Platelet -     CMP14+EGFR -     Lipid panel -     Bayer DCA Hb A1c Waived   I have discontinued Jannifer Franklin. [place]'s sildenafil and tadalafil. I am also having him maintain his aspirin, cholecalciferol, Cinnamon, sitaGLIPtin, traZODone, albuterol, Chantix Starting Month Pak, varenicline, metFORMIN, atorvastatin, lisinopril-hydrochlorothiazide, and tamsulosin.  No orders of the defined types were placed in this encounter.    Follow-up: Return in about 3 months (around 01/10/2020).  [name], M.D.

## 2019-10-12 ENCOUNTER — Ambulatory Visit (INDEPENDENT_AMBULATORY_CARE_PROVIDER_SITE_OTHER): Payer: Medicare Other | Admitting: *Deleted

## 2019-10-12 DIAGNOSIS — E1165 Type 2 diabetes mellitus with hyperglycemia: Secondary | ICD-10-CM

## 2019-10-12 DIAGNOSIS — I10 Essential (primary) hypertension: Secondary | ICD-10-CM

## 2019-10-12 DIAGNOSIS — Z599 Problem related to housing and economic circumstances, unspecified: Secondary | ICD-10-CM

## 2019-10-12 DIAGNOSIS — C61 Malignant neoplasm of prostate: Secondary | ICD-10-CM | POA: Diagnosis not present

## 2019-10-12 LAB — CBC WITH DIFFERENTIAL/PLATELET
Basophils Absolute: 0.1 10*3/uL (ref 0.0–0.2)
Basos: 1 %
EOS (ABSOLUTE): 0.2 10*3/uL (ref 0.0–0.4)
Eos: 4 %
Hematocrit: 33 % — ABNORMAL LOW (ref 37.5–51.0)
Hemoglobin: 11.1 g/dL — ABNORMAL LOW (ref 13.0–17.7)
Immature Grans (Abs): 0 10*3/uL (ref 0.0–0.1)
Immature Granulocytes: 0 %
Lymphocytes Absolute: 2.1 10*3/uL (ref 0.7–3.1)
Lymphs: 32 %
MCH: 30.2 pg (ref 26.6–33.0)
MCHC: 33.6 g/dL (ref 31.5–35.7)
MCV: 90 fL (ref 79–97)
Monocytes Absolute: 0.4 10*3/uL (ref 0.1–0.9)
Monocytes: 7 %
Neutrophils Absolute: 3.8 10*3/uL (ref 1.4–7.0)
Neutrophils: 56 %
Platelets: 231 10*3/uL (ref 150–450)
RBC: 3.67 x10E6/uL — ABNORMAL LOW (ref 4.14–5.80)
RDW: 13.1 % (ref 11.6–15.4)
WBC: 6.6 10*3/uL (ref 3.4–10.8)

## 2019-10-12 LAB — CMP14+EGFR
ALT: 28 IU/L (ref 0–44)
AST: 40 IU/L (ref 0–40)
Albumin/Globulin Ratio: 1.5 (ref 1.2–2.2)
Albumin: 3.9 g/dL (ref 3.7–4.7)
Alkaline Phosphatase: 101 IU/L (ref 39–117)
BUN/Creatinine Ratio: 13 (ref 10–24)
BUN: 18 mg/dL (ref 8–27)
Bilirubin Total: 0.4 mg/dL (ref 0.0–1.2)
CO2: 23 mmol/L (ref 20–29)
Calcium: 9.3 mg/dL (ref 8.6–10.2)
Chloride: 103 mmol/L (ref 96–106)
Creatinine, Ser: 1.36 mg/dL — ABNORMAL HIGH (ref 0.76–1.27)
GFR calc Af Amer: 59 mL/min/{1.73_m2} — ABNORMAL LOW (ref >59)
GFR calc non Af Amer: 51 mL/min/{1.73_m2} — ABNORMAL LOW (ref >59)
Globulin, Total: 2.6 g/dL (ref 1.5–4.5)
Glucose: 148 mg/dL — ABNORMAL HIGH (ref 65–99)
Potassium: 3.9 mmol/L (ref 3.5–5.2)
Sodium: 141 mmol/L (ref 134–144)
Total Protein: 6.5 g/dL (ref 6.0–8.5)

## 2019-10-12 LAB — LIPID PANEL
Chol/HDL Ratio: 3.6 ratio (ref 0.0–5.0)
Cholesterol, Total: 116 mg/dL (ref 100–199)
HDL: 32 mg/dL — ABNORMAL LOW (ref >39)
LDL Chol Calc (NIH): 59 mg/dL (ref 0–99)
Triglycerides: 141 mg/dL (ref 0–149)
VLDL Cholesterol Cal: 25 mg/dL (ref 5–40)

## 2019-10-12 NOTE — Chronic Care Management (AMB) (Signed)
  Chronic Care Management   Initial Visit Outreach Note  10/12/2019 Name: Andrew [name] MRN: [id number] DOB: [birth year]  Referred by: [name], MD Reason for referral : Chronic Care Management (Initial Visit)   An unsuccessful Initial Telephone Visit was attempted today. The patient was referred to the case management team for assistance with care management and care coordination.   RN Care Plan   . Chronic Disease Management Needs        CARE PLAN ENTRY (see longtitudinal plan of care for additional care plan information)  Current Barriers:  . Chronic Disease Management support, education, and care coordination needs related to HTN, DM, HLD, prostate CA  Clinical Goals: . Over the next 10 days, patient will be contacted by a Care Guide to reschedule their Initial CCM Visit . Over the next 30 days, patient will have an Initial CCM Visit with a member of the embedded CCM team to discuss self-management of their chronic medical conditions  Interventions: . Chart reviewed in preparation for initial visit telephone call . Collaboration with other care team members as needed . Unsuccessful outreach to patient  . A HIPPA compliant phone message was left for the patient providing contact information and requesting a return call.  . Request sent to care guides to reach out and reschedule patient's initial visit  Patient Self Care Activities: . Undetermined   Initial goal documentation        Follow Up Plan:  . A HIPPA compliant phone message was left for the patient providing contact information and requesting a return call.  . Request sent to care guides to reach out and reschedule patient's initial visit  Chong Sicilian, BSN, RN-BC [place] [facility] [place] [facility] Direct Dial: [phone]

## 2019-10-12 NOTE — Patient Instructions (Addendum)
Visit Information  Goals Addressed            This Visit's Progress     Patient Stated   . Chronic Disease Management Needs (pt-stated)       CARE PLAN ENTRY (see longtitudinal plan of care for additional care plan information)  Current Barriers:  . Chronic Disease Management support, education, and care coordination needs related to HTN, DM, HLD, prostate CA  Clinical Goal(s) related to HTN, DM, HLD, prostate CA:  Over the next 30 days, patient will:  . Work with the care management team to address educational, disease management, and care coordination needs  . Call provider office for new or worsened signs and symptoms . Call care management team with questions or concerns . Verbalize basic understanding of patient centered plan of care established today  Interventions related to HTN, DM, HLD, prostate CA:  . Evaluation of current treatment plans and patient's adherence to plan as established by provider . Assessed patient understanding of disease states . Assessed patient's education and care coordination needs . Provided disease specific education to patient  . Collaborated with appropriate clinical care team members regarding patient needs  Patient Self Care Activities related to HTN, DM, HLD, prostate CA:  . Patient is able to independently perform ADLS and IADLs  Initial goal documentation          Other   . Housing: Roof Repairs       CARE PLAN ENTRY (see longitudinal plan of care for additional care plan information)  Current Barriers:  Marland Kitchen Knowledge Deficits related to community assistance programs . Film/video editor.   Nurse Case Manager Clinical Goal(s):  Marland Kitchen Over the next 30 days, patient will work with CCM team to obtain information about community support programs  Interventions:  . Inter-disciplinary care team collaboration (see longitudinal plan of care) . Talked with patient by telephone regarding need for new roof and financial  constraints . Referral placed to Care Guides to see if they can provide any information on community assistance that might be able to help with the cost of his roof  Patient Self Care Activities:  . Performs ADL's independently . Performs IADL's independently  Initial goal documentation     . Prescription Assistance       CARE PLAN ENTRY (see longitudinal plan of care for additional care plan information)  Current Barriers:  Marland Kitchen Knowledge Deficits related to prescription assistance program  Nurse Case Manager Clinical Goal(s):  Marland Kitchen Over the next 30 days, patient will work with the CCM team to complete application for patient assistance for Januvia  Interventions:  . Inter-disciplinary care team collaboration (see longitudinal plan of care) . Chart reviewed . Talked with patient by telephone . Reviewed and discussed medications: metformin and Januvia. Patient is not taking Januvia right now due to cost and lack of available samples . Discussed [employer] patient assistance program . Collaborated with Lottie Dawson, PharmD . Scheduled patient an appointment with Almyra Free to complete PAP forms for [employer] . Advised that samples have been ordered but have not came in  Patient Self Care Activities:  . Performs ADL's independently . Performs IADL's independently  Initial goal documentation        Mr. Opara was given information about Chronic Care Management services today including:  1. CCM service includes personalized support from designated clinical staff supervised by his physician, including individualized plan of care and coordination with other care providers 2. 24/7 contact phone numbers for assistance for urgent and  routine care needs. 3. Service will only be billed when office clinical staff spend 20 minutes or more in a month to coordinate care. 4. Only one practitioner may furnish and bill the service in a calendar month. 5. The patient may stop CCM services at any time (effective  at the end of the month) by phone call to the office staff. 6. The patient will be responsible for cost sharing (co-pay) of up to 20% of the service fee (after annual deductible is met).  Patient agreed to services and verbal consent obtained.   The patient verbalized understanding of instructions provided today and declined a print copy of patient instruction materials.   The care management team will reach out to the patient again over the next 30 days.   Chong Sicilian, BSN, RN-BC Embedded Chronic Care Manager [facility] [place] [facility] Direct Dial: [phone]

## 2019-10-12 NOTE — Chronic Care Management (AMB) (Signed)
Chronic Care Management   Initial Visit Note  10/12/2019 Name: Andrew [name] MRN: [id number] DOB: Nov 19, 1945  Referred by: [name], MD Reason for referral : Chronic Care Management (Initial Visit)   Andrew [name] is a 74 y.o. year old male who is a primary care patient of Stacks, Cletus Gash, MD. The CCM team was consulted for assistance with chronic disease management and care coordination needs related to HTN, DM, HLD, prostate CA.  Review of patient status, including review of consultants reports, relevant laboratory and other test results, and collaboration with appropriate care team members and the patient's provider was performed as part of comprehensive patient evaluation and provision of chronic care management services.    Subjective: I spoke with Andrew [name] by telephone regarding management of his chronic medical conditions.   SDOH (Social Determinants of Health) assessments performed: Yes See Care Plan activities for detailed interventions related to SDOH  SDOH Interventions     Most Recent Value  SDOH Interventions  SDOH Interventions for the Following Domains  Housing  Housing Interventions  Other (Comment) [Needs new roof. Care Guide referral placed requesting research into community programs that may be able to help financially.]        Outpatient Encounter Medications as of 10/12/2019  Medication Sig  . albuterol (VENTOLIN HFA) 108 (90 Base) MCG/ACT inhaler Inhale 2 puffs into the lungs every 6 (six) hours as needed for wheezing or shortness of breath. (Patient not taking: Reported on 10/11/2019)  . aspirin 81 MG tablet Take 81 mg by mouth daily.  Marland Kitchen atorvastatin (LIPITOR) 40 MG tablet TAKE 1 TABLET BY MOUTH  DAILY  . cholecalciferol (VITAMIN D) 1000 UNITS tablet Take 1,000 Units by mouth daily.   . Cinnamon 500 MG capsule Take 500 mg by mouth daily as needed (blood glucose levels).  Marland Kitchen lisinopril-hydrochlorothiazide (ZESTORETIC) 20-25 MG tablet TAKE 1 TABLET BY MOUTH   DAILY  . metFORMIN (GLUCOPHAGE) 1000 MG tablet TAKE 1 TABLET BY MOUTH  TWICE DAILY WITH MEALS  . sitaGLIPtin (JANUVIA) 100 MG tablet Take 1 tablet (100 mg total) by mouth daily. Not taking due to cost  . tamsulosin (FLOMAX) 0.4 MG CAPS capsule TAKE 1 CAPSULE BY MOUTH  DAILY  . traZODone (DESYREL) 150 MG tablet Use from 1/3 to 1 tablet nightly as needed for sleep.  . varenicline (CHANTIX STARTING MONTH PAK) 0.5 MG X 11 & 1 MG X 42 tablet Take 1 0.5 mg tablet by mouth daily for 3 days, then increase to 1 0.5 mg tablet twice daily for 4 days, then increase to one 1 mg tablet twice daily. (Patient not taking: Reported on 10/11/2019)  . varenicline (CHANTIX) 1 MG tablet Take 1 tablet (1 mg total) by mouth 2 (two) times daily. (Patient not taking: Reported on 10/11/2019)   No facility-administered encounter medications on file as of 10/12/2019.      RN Care Plan   . Chronic Disease Management Needs (pt-stated)       CARE PLAN ENTRY (see longtitudinal plan of care for additional care plan information)  Current Barriers:  . Chronic Disease Management support, education, and care coordination needs related to HTN, DM, HLD, prostate CA  Clinical Goal(s) related to HTN, DM, HLD, prostate CA:  Over the next 30 days, patient will:  . Work with the care management team to address educational, disease management, and care coordination needs  . Call provider office for new or worsened signs and symptoms . Call care management team with questions or  concerns . Verbalize basic understanding of patient centered plan of care established today  Interventions related to HTN, DM, HLD, prostate CA:  . Evaluation of current treatment plans and patient's adherence to plan as established by provider . Assessed patient understanding of disease states . Assessed patient's education and care coordination needs . Provided disease specific education to patient  . Collaborated with appropriate clinical care team  members regarding patient needs  Patient Self Care Activities related to HTN, DM, HLD, prostate CA:  . Patient is able to independently perform ADLS and IADLs  Initial goal documentation        . Housing: Roof Repairs       CARE PLAN ENTRY (see longitudinal plan of care for additional care plan information)  Current Barriers:  Marland Kitchen Knowledge Deficits related to community assistance programs . Film/video editor.   Nurse Case Manager Clinical Goal(s):  Marland Kitchen Over the next 30 days, patient will work with CCM team to obtain information about community support programs  Interventions:  . Inter-disciplinary care team collaboration (see longitudinal plan of care) . Talked with patient by telephone regarding need for new roof and financial constraints . Referral placed to Care Guides to see if they can provide any information on community assistance that might be able to help with the cost of his roof  Patient Self Care Activities:  . Performs ADL's independently . Performs IADL's independently  Initial goal documentation     . Prescription Assistance       CARE PLAN ENTRY (see longitudinal plan of care for additional care plan information)  Current Barriers:  Marland Kitchen Knowledge Deficits related to prescription assistance program  Nurse Case Manager Clinical Goal(s):  Marland Kitchen Over the next 30 days, patient will work with the CCM team to complete application for patient assistance for Januvia  Interventions:  . Inter-disciplinary care team collaboration (see longitudinal plan of care) . Chart reviewed . Talked with patient by telephone . Reviewed and discussed medications: metformin and Januvia. Patient is not taking Januvia right now due to cost and lack of available samples . Discussed [employer] patient assistance program . Collaborated with Lottie Dawson, PharmD . Scheduled patient an appointment with Almyra Free to complete PAP forms for [employer] . Advised that samples have been ordered but have not  came in  Patient Self Care Activities:  . Performs ADL's independently . Performs IADL's independently  Initial goal documentation         Follow-up Plan:   The care management team will reach out to the patient again over the next 30 days.   Chong Sicilian, BSN, RN-BC Embedded Chronic Care Manager [facility] [place] [facility] Direct Dial: [phone]

## 2019-10-12 NOTE — Progress Notes (Signed)
Hello Andrew [name],  Your lab result is normal and/or stable.Some minor variations that are not significant are commonly marked abnormal, but do not represent any medical problem for you.  Best regards, Jaylyne Breese, M.D.

## 2019-10-12 NOTE — Addendum Note (Signed)
Addended by: Ilean China on: 10/12/2019 02:27 PM   Modules accepted: Orders

## 2019-10-13 ENCOUNTER — Other Ambulatory Visit: Payer: Self-pay

## 2019-10-13 ENCOUNTER — Ambulatory Visit (INDEPENDENT_AMBULATORY_CARE_PROVIDER_SITE_OTHER): Payer: Medicare Other | Admitting: Pharmacist

## 2019-10-13 DIAGNOSIS — E1165 Type 2 diabetes mellitus with hyperglycemia: Secondary | ICD-10-CM | POA: Diagnosis not present

## 2019-10-13 MED ORDER — GLUCOSE BLOOD VI STRP: 1.0000 | ORAL_STRIP | 11 refills | Status: DC | PRN

## 2019-10-17 ENCOUNTER — Telehealth: Payer: Self-pay

## 2019-10-17 NOTE — Telephone Encounter (Signed)
10/17/19 Left message with patient's wife for patient to return my call regarding financial assisstance with roof. Ambrose Mantle [phone]

## 2019-10-17 NOTE — Progress Notes (Signed)
         S:    32 yoM with PMH significant for DM, HTN, HLD, current smoker arrives to pharmacy clinic today for diabetes evaluation, education, and management.  Patient was referred and last seen by Primary Care Provider on 10/11/19.  Patient is also a current smoker, therefore we will explore smoking cessation.   Insurance coverage/medication affordability: [employer] medicare   T2DM-A1c 7.1%, BGs worsening per patient due to cost issues  Most recent eGFR: 59  Current antihyperglycemic regimen: Metformin 1g BID, Januvia '100mg'$  daily (hasn't been taking due to cost)  Denies hypoglycemic symptoms  Reports hyperglycemic symptoms, including polyuria, nocturia  Current meal patterns: discussed eating lower carb, increase vegetable (non-starchy) intake, protein.  Avoid sugary drinks, etc,  Limit dessert intake.  Current exercise: n/a, encouraged patient to increase physical activity as able Current blood glucose readings: fasting blood sugars: 150-200s, FBG this AM in clinic was 147 (s/p crackers/pB)   Cardiovascular risk reduction: ? Current hypertensive regimen: lisinopril 20/hctz '25mg'$  daily ? Current hyperlipidemia regimen: atorvastatin 40 (LDL 59 on 10/11/19) ? Current antiplatelet regimen: ASA 81   Patient denies hypoglycemic events.   O:   Lab Results  Component Value Date   HGBA1C 7.1 (H) 10/11/2019   Lipid Panel     Component Value Date/Time   CHOL 116 10/11/2019 1129   CHOL 144 11/18/2012 0844   TRIG 141 10/11/2019 1129   TRIG 142 01/03/2016 0800   TRIG 161 (H) 11/18/2012 0844   HDL 32 (L) 10/11/2019 1129   HDL 31 (L) 01/03/2016 0800   HDL 27 (L) 11/18/2012 0844   CHOLHDL 3.6 10/11/2019 1129   LDLCALC 59 10/11/2019 1129   LDLCALC 45 03/08/2014 0810   LDLCALC 85 11/18/2012 0844     A/P:   Diabetes: B8ML; complicated by chronic medical conditions including HTN, HLD, most recent A1c 7.1% on 10/11/19 ? Patient is able to verbalize appropriate hypoglycemia  management plan. Patient is adherent with medication. Control is suboptimal due to diet/lifestyle modifications.  PLAN:  -Continue metformin.  Continue Januvia (patient has been without due to cost-->will give samples of Januvia once shipment arrives)  -One touch verio glucometer given and set up with patient.  Test strips called in to pharmacy  -Extensively discussed pathophysiology of diabetes, recommended lifestyle interventions, dietary effects on blood sugar control  -Counseled on s/sx of and management of hypoglycemia  -Next A1C anticipated July 2021.  -Extensively discussed pathophysiology of diabetes, recommended lifestyle interventions, dietary effects on blood sugar control  -Counseled on s/sx of and management of hypoglycemia  -Next A1C anticipated 01/10/20.         Written patient instructions provided.  Total time in face to face counseling 29 minutes.   Follow up Pharmacist in 2 months   Regina Eck, PharmD, BCPS Clinical Pharmacist, [place]  [facility] Phone [phone]

## 2019-10-18 ENCOUNTER — Telehealth: Payer: Self-pay

## 2019-10-18 ENCOUNTER — Other Ambulatory Visit: Payer: Self-pay

## 2019-10-18 ENCOUNTER — Ambulatory Visit (AMBULATORY_SURGERY_CENTER): Payer: Self-pay | Admitting: *Deleted

## 2019-10-18 VITALS — Temp 96.8°F | Ht 68.5 in | Wt 216.0 lb

## 2019-10-18 DIAGNOSIS — Z86010 Personal history of colonic polyps: Secondary | ICD-10-CM

## 2019-10-18 MED ORDER — SUTAB 1479-225-188 MG PO TABS: 1.0000 | ORAL_TABLET | ORAL | 0 refills | Status: DC

## 2019-10-18 NOTE — Telephone Encounter (Signed)
10/18/19 Spoke with patient assisted with filling out [employer] cancer support fund application.  Spoke with them on the phone and they may be able to assist patient with his roof.    Emailed letter and application to [employer] to be mailed to patient. Will call patient in a few days to confirm he received application.  Ambrose Mantle [phone]

## 2019-10-18 NOTE — Progress Notes (Signed)
Patient is here in-person for PV. Wife with pt in PV temp=96.2. Patient denies any allergies to eggs or soy. Patient denies any problems with anesthesia/sedation. Patient denies any oxygen use at home. Patient denies taking any diet/weight loss medications or blood thinners. Patient is not being treated for MRSA or C-diff. EMMI education assisgned to the patient for the procedure, this was explained and instructions given to patient. Patient is aware of our care-partner policy and 0000000 safety protocol.   Prep Prescription universal coupon given to the patient.  Completed covid vaccines per pt on 3/22/20201.

## 2019-10-18 NOTE — Telephone Encounter (Signed)
10/18/19 Left message on  voicemail for patient to return my call regarding financial assistance with a roof. Ambrose Mantle [phone]

## 2019-10-24 ENCOUNTER — Telehealth: Payer: Self-pay

## 2019-10-24 ENCOUNTER — Telehealth: Payer: Self-pay | Admitting: Internal Medicine

## 2019-10-24 NOTE — Telephone Encounter (Signed)
10/24/19 Spoke with patient's wife, patient has received [place] application will sign and mail this week.  There were no other resources needed. Closing referral. [phone]

## 2019-10-24 NOTE — Telephone Encounter (Signed)
Called pharmacy and gave Medicare Coupon for [facility]. Cost is now $40 for patient.

## 2019-10-27 ENCOUNTER — Encounter: Payer: Self-pay | Admitting: Internal Medicine

## 2019-11-01 ENCOUNTER — Encounter: Payer: Self-pay | Admitting: Internal Medicine

## 2019-11-01 ENCOUNTER — Other Ambulatory Visit: Payer: Self-pay

## 2019-11-01 ENCOUNTER — Ambulatory Visit (AMBULATORY_SURGERY_CENTER): Payer: Medicare Other | Admitting: Internal Medicine

## 2019-11-01 VITALS — BP 123/65 | HR 77 | Temp 97.1°F | Resp 15 | Ht 68.5 in | Wt 216.0 lb

## 2019-11-01 DIAGNOSIS — Z86010 Personal history of colonic polyps: Secondary | ICD-10-CM | POA: Diagnosis not present

## 2019-11-01 MED ORDER — SODIUM CHLORIDE 0.9 % IV SOLN
500.0000 mL | Freq: Once | INTRAVENOUS | Status: DC
Start: 2019-11-01 — End: 2019-11-01

## 2019-11-01 NOTE — Progress Notes (Signed)
Vitals-DT Temp-LC  Pt's states no medical or surgical changes since previsit or office visit.

## 2019-11-01 NOTE — Patient Instructions (Signed)
YOU HAD AN ENDOSCOPIC PROCEDURE TODAY AT THE [facility]:   Refer to the procedure report that was given to you for any specific questions about what was found during the examination.  If the procedure report does not answer your questions, please call your gastroenterologist to clarify.  If you requested that your care partner not be given the details of your procedure findings, then the procedure report has been included in a sealed envelope for you to review at your convenience later.  YOU SHOULD EXPECT: Some feelings of bloating in the abdomen. Passage of more gas than usual.  Walking can help get rid of the air that was put into your GI tract during the procedure and reduce the bloating. If you had a lower endoscopy (such as a colonoscopy or flexible sigmoidoscopy) you may notice spotting of blood in your stool or on the toilet paper. If you underwent a bowel prep for your procedure, you may not have a normal bowel movement for a few days.  Please Note:  You might notice some irritation and congestion in your nose or some drainage.  This is from the oxygen used during your procedure.  There is no need for concern and it should clear up in a day or so.  SYMPTOMS TO REPORT IMMEDIATELY:   Following lower endoscopy (colonoscopy or flexible sigmoidoscopy):  Excessive amounts of blood in the stool  Significant tenderness or worsening of abdominal pains  Swelling of the abdomen that is new, acute  Fever of 100F or higher  For urgent or emergent issues, a gastroenterologist can be reached at any hour by calling [phone]. Do not use MyChart messaging for urgent concerns.    DIET:  We do recommend a small meal at first, but then you may proceed to your regular diet.  Drink plenty of fluids but you should avoid alcoholic beverages for 24 hours.  ACTIVITY:  You should plan to take it easy for the rest of today and you should NOT DRIVE or use heavy machinery until tomorrow (because  of the sedation medicines used during the test).    FOLLOW UP: Our staff will call the number listed on your records 48-72 hours following your procedure to check on you and address any questions or concerns that you may have regarding the information given to you following your procedure. If we do not reach you, we will leave a message.  We will attempt to reach you two times.  During this call, we will ask if you have developed any symptoms of COVID 19. If you develop any symptoms (ie: fever, flu-like symptoms, shortness of breath, cough etc.) before then, please call [phone].  If you test positive for Covid 19 in the 2 weeks post procedure, please call and report this information to us.    If any biopsies were taken you will be contacted by phone or by letter within the next 1-3 weeks.  Please call us at [phone] if you have not heard about the biopsies in 3 weeks.    SIGNATURES/CONFIDENTIALITY: You and/or your care partner have signed paperwork which will be entered into your electronic medical record.  These signatures attest to the fact that that the information above on your After Visit Summary has been reviewed and is understood.  Full responsibility of the confidentiality of this discharge information lies with you and/or your care-partner. 

## 2019-11-01 NOTE — Op Note (Signed)
[facility] Patient Name: Andrew [name] Procedure Date: 11/01/2019 9:24 AM MRN: [id number] Endoscopist: Jerene Bears , MD Age: 74 Referring MD:  Date of Birth: 1946/03/25 Gender: Male Account #: [id number] Procedure:                Colonoscopy Indications:              High risk colon cancer surveillance: Personal                            history of multiple (3 or more) adenomas, Last                            colonoscopy 3 years ago Medicines:                Monitored Anesthesia Care Procedure:                Pre-Anesthesia Assessment:                           - Prior to the procedure, a History and Physical                            was performed, and patient medications and                            allergies were reviewed. The patient's tolerance of                            previous anesthesia was also reviewed. The risks                            and benefits of the procedure and the sedation                            options and risks were discussed with the patient.                            All questions were answered, and informed consent                            was obtained. Prior Anticoagulants: The patient has                            taken no previous anticoagulant or antiplatelet                            agents. ASA Grade Assessment: III - A patient with                            severe systemic disease. After reviewing the risks                            and benefits, the patient was deemed in  satisfactory condition to undergo the procedure.                           After obtaining informed consent, the colonoscope                            was passed under direct vision. Throughout the                            procedure, the patient's blood pressure, pulse, and                            oxygen saturations were monitored continuously. The                            Colonoscope was introduced through the anus and                             advanced to the colocolonic anastomosis created                            status post left colectomy. The colonoscopy was                            performed without difficulty. The patient tolerated                            the procedure well. The quality of the bowel                            preparation was good. The ileocecal valve,                            appendiceal orifice, and rectum were photographed. Scope In: 9:34:01 AM Scope Out: 9:47:45 AM Scope Withdrawal Time: 0 hours 10 minutes 31 seconds  Total Procedure Duration: 0 hours 13 minutes 44 seconds  Findings:                 The digital rectal exam was normal.                           There was a small lipoma, in the ascending colon.                           Multiple small and large-mouthed diverticula were                            found in the sigmoid colon, descending colon and                            ascending colon.                           Internal hemorrhoids were found during  retroflexion. The hemorrhoids were small.                           The exam was otherwise without abnormality. Complications:            No immediate complications. Estimated Blood Loss:     Estimated blood loss: none. Impression:               - Small lipoma in the ascending colon.                           - Diverticulosis in the sigmoid colon, in the                            descending colon and in the ascending colon.                           - Internal hemorrhoids.                           - The examination was otherwise normal.                           - No specimens collected. Recommendation:           - Patient has a contact number available for                            emergencies. The signs and symptoms of potential                            delayed complications were discussed with the                            patient. Return to normal activities tomorrow.                             Written discharge instructions were provided to the                            patient.                           - Resume previous diet.                           - Continue present medications.                           - No repeat colonoscopy due to age at next                            surveillance interval and the absence of colonic                            polyps on today's examination. Jerene Bears, MD 11/01/2019 9:51:06 AM This report has been signed electronically.

## 2019-11-01 NOTE — Progress Notes (Signed)
Report given to PACU, vss 

## 2019-11-03 ENCOUNTER — Telehealth: Payer: Self-pay | Admitting: *Deleted

## 2019-11-03 ENCOUNTER — Telehealth: Payer: Self-pay

## 2019-11-03 ENCOUNTER — Other Ambulatory Visit: Payer: Self-pay | Admitting: Family Medicine

## 2019-11-03 DIAGNOSIS — E785 Hyperlipidemia, unspecified: Secondary | ICD-10-CM

## 2019-11-03 DIAGNOSIS — I10 Essential (primary) hypertension: Secondary | ICD-10-CM

## 2019-11-03 DIAGNOSIS — E1165 Type 2 diabetes mellitus with hyperglycemia: Secondary | ICD-10-CM

## 2019-11-03 NOTE — Telephone Encounter (Signed)
  Follow up Call-  Call back number 11/01/2019  Post procedure Call Back phone  # [phone]  Permission to leave phone message Yes  Some recent data might be hidden     Patient questions:  Do you have a fever, pain , or abdominal swelling? No. Pain Score  0 *  Have you tolerated food without any problems? Yes.    Have you been able to return to your normal activities? Yes.    Do you have any questions about your discharge instructions: Diet   No. Medications  No. Follow up visit  No.  Do you have questions or concerns about your Care? No.  Actions: * If pain score is 4 or above: No action needed, pain <4.  1. Have you developed a fever since your procedure? no  2.   Have you had an respiratory symptoms (SOB or cough) since your procedure? no  3.   Have you tested positive for COVID 19 since your procedure no  4.   Have you had any family members/close contacts diagnosed with the COVID 19 since your procedure?  no   If yes to any of these questions please route to Joylene John, RN and Erenest Rasher, RN

## 2019-11-03 NOTE — Telephone Encounter (Signed)
Patient returned your call and left a message with answering service. Please call patient one more time.

## 2019-11-03 NOTE — Telephone Encounter (Signed)
No answer on follow up call. 

## 2019-11-04 ENCOUNTER — Telehealth: Payer: Medicare Other

## 2019-11-07 ENCOUNTER — Encounter: Payer: Self-pay | Admitting: Pharmacist

## 2019-11-07 ENCOUNTER — Ambulatory Visit (INDEPENDENT_AMBULATORY_CARE_PROVIDER_SITE_OTHER): Payer: Medicare Other | Admitting: Pharmacist

## 2019-11-07 ENCOUNTER — Other Ambulatory Visit: Payer: Self-pay

## 2019-11-07 VITALS — BP 119/72 | HR 66

## 2019-11-07 DIAGNOSIS — I10 Essential (primary) hypertension: Secondary | ICD-10-CM

## 2019-11-07 DIAGNOSIS — E1165 Type 2 diabetes mellitus with hyperglycemia: Secondary | ICD-10-CM | POA: Diagnosis not present

## 2019-11-07 NOTE — Progress Notes (Signed)
Pharmacy Clinic -- Diabetes  11/07/2019 Name: Andrew [name] MRN: [id number] DOB: 10/15/1945  Referred by: [name], MD Reason for referral : Diabetes  S:  59 yoM with PMH significant for DM, HTN, HLD, current smoker arrives to pharmacy clinic today for diabetes evaluation, education, and management.  Patient was referred and last seen by Primary Care Provider on 10/11/19.  Patient is also a current smoker, therefore we will continue explore smoking cessation.   Insurance coverage/medication affordability: UHC medicare   T2DM-A1c 7.1%, BGs worsening per patient due to cost issues (has previously had A1cs in the 6%s)  Most recent eGFR:59  Current antihyperglycemic regimen:Metformin 1g BID, Januvia 164m daily (hasn't been taking due to cost-samples ran out)  Denies hypoglycemic symptoms  Reportshyperglycemic symptoms, including polyuria, nocturia  Current meal patterns:discussed eating lower carb, increase vegetable (non-starchy) intake, protein. Avoid sugary drinks, etc, Limit dessert intake.  Current exercise:n/a, encouraged patient to increase physical activity as able Current blood glucose readings:fasting blood sugars:150-180s, FBG this AM in clinic was 160  Cardiovascular risk reduction: ? Current hypertensive regimen:lisinopril 20/hctz 224mdaily ? Current hyperlipidemia regimen:atorvastatin 40 (LDL 59 on 10/11/19) ? Current antiplatelet regimen:ASA 81  Patient denies hypoglycemic events.  Patient-reported exercise habits: n/a   O:  Lab Results  Component Value Date   HGBA1C 7.1 (H) 10/11/2019    Vitals:   11/07/19 1224  BP: 119/72  Pulse: 66     Lipid Panel     Component Value Date/Time   CHOL 116 10/11/2019 1129   CHOL 144 11/18/2012 0844   TRIG 141 10/11/2019 1129   TRIG 142 01/03/2016 0800   TRIG 161 (H) 11/18/2012 0844   HDL 32 (L) 10/11/2019 1129   HDL 31 (L) 01/03/2016 0800   HDL 27 (L) 11/18/2012 0844   CHOLHDL 3.6  10/11/2019 1129   LDLCALC 59 10/11/2019 1129   LDLCALC 45 03/08/2014 0810   LDLCALC 85 11/18/2012 0844   Home fasting blood sugars: 150-180s  2 hour post-meal/random blood sugars: n/a.   A/P:  Diabetes T2DM currently controlled, however patient's BGs are trending up due to financial reasons. Patient is able to verbalize appropriate hypoglycemia management plan.  Control is suboptimal due to cost of medications.  -DISCONTINUE Januvia 10080mpatient has been out for 2 weeks)  Applied for patient assistance with Januvia (via [employer]) in the event patient cannot tolerate new combo SGLT2/DPP4, will d/c this process if other med is  approved/tolerated  -Started SGLT2-I/DPP4 combination medication:  Glyxambi (generic name: empagliflozin/linagliptin 10/5mg43m tablet by mouth daily. Counseled on sick day rules for patient to hold medication is not eating of vomiting.  Encouraged patient to stay hydrated with water.  Sample given GLYXAMBI 10MG/5MG :#21 tablets, LOT#[id number] 3/22  Can apply for patient assistance if medication is efficacious and patient is able to tolerate  -CONTINUE metformin 1g BID for now, however encouraged patient to monitor BGs as he may be able to maintain glycemic control with new combination SGLT2/DPP4  -Extensively discussed pathophysiology of diabetes, recommended lifestyle interventions, dietary effects on blood sugar control  -Counseled on s/sx of and management of hypoglycemia   -Next A1C anticipated July 2021   Written patient instructions provided.  Total time in face to face counseling 17 minutes.   Follow up PCP Clinic Visit in July. Patient to call PharmD with upcoming BGs.  Will adjust medications as necessary    JuliRegina EckarmD, BCPS Clinical Pharmacist, [facility]  II Phone [phone]

## 2019-11-10 ENCOUNTER — Telehealth: Payer: Self-pay | Admitting: Family Medicine

## 2019-11-10 NOTE — Telephone Encounter (Signed)
Patient remains on Glyxambi samples 10/5mg  (1 tablet by mouth daily) Denies BG<70  BG readings much improved.  Patient is excited and encouraged by BG results.  Will continue Glyxambi at this time.  Hoping to keep patient on samples until medication approved through patient assistance.  Will attempt BI patient assistance now that patient is tolerating medication and doing well!  Encouraged patient to call if samples needed.  Tues AM 123  PM 113 Wed AM 143   PM 113 Thur AM 118

## 2019-11-10 NOTE — Telephone Encounter (Signed)
Pt called requesting to speak with Almyra Free regarding his sugar. Wants to be called back @ [phone]

## 2019-11-10 NOTE — Telephone Encounter (Signed)
Returned patients phone call.  Patient wanted to to call to give Anitra Lauth, [facility] blood sugar readings since starting Gylaxmbi 10mg /5mg      Tues AM 123  PM 113 Wed AM 143 PM 113 Thur AM 118   No side effects at this time

## 2019-12-02 ENCOUNTER — Telehealth: Payer: Self-pay | Admitting: Family Medicine

## 2019-12-02 NOTE — Telephone Encounter (Signed)
Aware. No samples available.  Savings card mailed to patient.

## 2019-12-02 NOTE — Telephone Encounter (Signed)
Pt wanting to know if the office has any samples of glyxambi.

## 2019-12-12 ENCOUNTER — Ambulatory Visit (INDEPENDENT_AMBULATORY_CARE_PROVIDER_SITE_OTHER): Payer: Medicare Other | Admitting: *Deleted

## 2019-12-12 DIAGNOSIS — E1165 Type 2 diabetes mellitus with hyperglycemia: Secondary | ICD-10-CM | POA: Diagnosis not present

## 2019-12-12 NOTE — Chronic Care Management (AMB) (Signed)
Chronic Care Management   Follow Up Note   12/12/2019 Name: Andrew [name] MRN: [id number] DOB: 1946-01-08  Referred by: [name], MD Reason for referral : Chronic Care Management (RN follow up)   Andrew [name] is a 74 y.o. year old male who is a primary care patient of Stacks, Cletus Gash, MD. The CCM team was consulted for assistance with chronic disease management and care coordination needs.    Review of patient status, including review of consultants reports, relevant laboratory and other test results, and collaboration with appropriate care team members and the patient's provider was performed as part of comprehensive patient evaluation and provision of chronic care management services.    SDOH (Social Determinants of Health) assessments performed: No See Care Plan activities for detailed interventions related to [facility])     Outpatient Encounter Medications as of 12/12/2019  Medication Sig Note  . albuterol (VENTOLIN HFA) 108 (90 Base) MCG/ACT inhaler Inhale 2 puffs into the lungs every 6 (six) hours as needed for wheezing or shortness of breath.   Marland Kitchen aspirin 81 MG tablet Take 81 mg by mouth daily.   Marland Kitchen atorvastatin (LIPITOR) 40 MG tablet TAKE 1 TABLET BY MOUTH  DAILY   . cholecalciferol (VITAMIN D) 1000 UNITS tablet Take 1,000 Units by mouth daily.    . Cinnamon 500 MG capsule Take 500 mg by mouth daily as needed (blood glucose levels).   . Empagliflozin-linaGLIPtin (GLYXAMBI) 10-5 MG TABS Take 1 tablet by mouth daily. 11/07/2019: #21 tabs [id number] A EXP 3/22  . glucose blood test strip 1 each by Other route as needed for other. Use as instructed to test blood sugar daily.  One Touch Verio Test Strips DX: E11.65 10/17/2019: One Touch Verio  . lisinopril-hydrochlorothiazide (ZESTORETIC) 20-25 MG tablet TAKE 1 TABLET BY MOUTH  DAILY   . metFORMIN (GLUCOPHAGE) 1000 MG tablet TAKE 1 TABLET BY MOUTH  TWICE DAILY WITH MEALS   . sildenafil (VIAGRA) 25 MG tablet Take 25 mg by mouth daily as  needed for erectile dysfunction.   . Sodium Sulfate-Mag Sulfate-KCl (SUTAB) 4344506736 MG TABS Take 1 kit by mouth as directed.   . tadalafil (CIALIS) 20 MG tablet Take 20 mg by mouth daily as needed for erectile dysfunction.   . tamsulosin (FLOMAX) 0.4 MG CAPS capsule TAKE 1 CAPSULE BY MOUTH  DAILY   . traZODone (DESYREL) 150 MG tablet Use from 1/3 to 1 tablet nightly as needed for sleep.    No facility-administered encounter medications on file as of 12/12/2019.     RN Care Plan   .  "I need to get samples of Glyxambi" (pt-stated)        CARE PLAN ENTRY (see longitudinal plan of care for additional care plan information)  Current Barriers:  . Care Coordination needs related to medication samples in a patient with diabetes (disease states)  Nurse Case Manager Clinical Goal(s):  Marland Kitchen Over the next 14 days, patient will work with PharmD regarding medication management for diabetes  Interventions:  . Inter-disciplinary care team collaboration (see longitudinal plan of care) . Chart reviewed including recent office notes and lab results . Talked with patient by telephone . Reviewed and discussed medications . Patient has been out of Glyxambi 75m for a couple of weeks. Has checked to see if samples were available, and they were not . I checked for samples as well and verified that they are still out of stock . I will collaborate with PharmD when she returns to the office on  12/14/19 . Discussed blood sugar readings o Patient reports readings between 100 and 150 without medication . Provided with RNCM contact information and encouraged to reach out as needed  Patient Self Care Activities:  . Performs ADL's independently . Performs IADL's independently  Initial goal documentation       Other   .  COMPLETED: Housing: Roof Repairs        CARE PLAN ENTRY (see longitudinal plan of care for additional care plan information)  Current Barriers:  Marland Kitchen Knowledge Deficits related to  community assistance programs . Film/video editor.   Nurse Case Manager Clinical Goal(s):  Marland Kitchen Over the next 30 days, patient will work with CCM team to obtain information about community support programs  Interventions:  . Inter-disciplinary care team collaboration (see longitudinal plan of care) . Talked with patient by telephone regarding need for new roof and financial constraints . Referral placed to [employer] to see if they can provide any information on community assistance that might be able to help with the cost of his roof  Patient Self Care Activities:  . Performs ADL's independently . Performs IADL's independently  Initial goal documentation  Patient worked with [employer] and was given application for [facility] support. Has had someone come out and repair roof.   .  COMPLETED: Prescription Assistance        CARE PLAN ENTRY (see longitudinal plan of care for additional care plan information)  Current Barriers:  Marland Kitchen Knowledge Deficits related to prescription assistance program  Nurse Case Manager Clinical Goal(s):  Marland Kitchen Over the next 30 days, patient will work with the CCM team to complete application for patient assistance for Januvia  Interventions:  . Inter-disciplinary care team collaboration (see longitudinal plan of care) . Chart reviewed . Talked with patient by telephone . Reviewed and discussed medications: metformin and Januvia. Patient is not taking Januvia right now due to cost and lack of available samples . Discussed [employer] patient assistance program . Collaborated with Lottie Dawson, PharmD . Scheduled patient an appointment with Almyra Free to complete PAP forms for [employer] . Advised that samples have been ordered but have not came in  Patient Self Care Activities:  . Performs ADL's independently . Performs IADL's independently  Initial goal documentation  Working with PharmD. See Clinical Support note from 11/07/19.        Plan:   The  care management team will reach out to the patient again over the next 30 days.    Chong Sicilian, BSN, RN-BC Embedded Chronic Care Manager [facility] [place] [facility] Direct Dial: [phone]

## 2019-12-12 NOTE — Patient Instructions (Signed)
Visit Information  Goals Addressed              This Visit's Progress     Patient Stated   .  "I need to get samples of Glyxambi" (pt-stated)        CARE PLAN ENTRY (see longitudinal plan of care for additional care plan information)  Current Barriers:  . Care Coordination needs related to medication samples in a patient with diabetes (disease states)  Nurse Case Manager Clinical Goal(s):  Marland Kitchen Over the next 14 days, patient will work with PharmD regarding medication management for diabetes  Interventions:  . Inter-disciplinary care team collaboration (see longitudinal plan of care) . Chart reviewed including recent office notes and lab results . Talked with patient by telephone . Reviewed and discussed medications . Patient has been out of Glyxambi 10mg  for a couple of weeks. Has checked to see if samples were available, and they were not . I checked for samples as well and verified that they are still out of stock . I will collaborate with PharmD when she returns to the office on 12/14/19 . Discussed blood sugar readings o Patient reports readings between 100 and 150 without medication . Provided with RNCM contact information and encouraged to reach out as needed  Patient Self Care Activities:  . Performs ADL's independently . Performs IADL's independently  Initial goal documentation       Other   .  COMPLETED: Housing: Roof Repairs        CARE PLAN ENTRY (see longitudinal plan of care for additional care plan information)  Current Barriers:  Marland Kitchen Knowledge Deficits related to community assistance programs . Film/video editor.   Nurse Case Manager Clinical Goal(s):  Marland Kitchen Over the next 30 days, patient will work with CCM team to obtain information about community support programs  Interventions:  . Inter-disciplinary care team collaboration (see longitudinal plan of care) . Talked with patient by telephone regarding need for new roof and financial  constraints . Referral placed to [employer] to see if they can provide any information on community assistance that might be able to help with the cost of his roof  Patient Self Care Activities:  . Performs ADL's independently . Performs IADL's independently  Initial goal documentation  Patient worked with [place] and was given application for [facility] support.     .  COMPLETED: Prescription Assistance        CARE PLAN ENTRY (see longitudinal plan of care for additional care plan information)  Current Barriers:  Marland Kitchen Knowledge Deficits related to prescription assistance program  Nurse Case Manager Clinical Goal(s):  Marland Kitchen Over the next 30 days, patient will work with the CCM team to complete application for patient assistance for Januvia  Interventions:  . Inter-disciplinary care team collaboration (see longitudinal plan of care) . Chart reviewed . Talked with patient by telephone . Reviewed and discussed medications: metformin and Januvia. Patient is not taking Januvia right now due to cost and lack of available samples . Discussed [employer] patient assistance program . Collaborated with Lottie Dawson, PharmD . Scheduled patient an appointment with Almyra Free to complete PAP forms for [employer] . Advised that samples have been ordered but have not came in  Patient Self Care Activities:  . Performs ADL's independently . Performs IADL's independently  Initial goal documentation  Working with PharmD. See Clinical Support note from 11/07/19.       The patient verbalized understanding of instructions provided today and declined a  print copy of patient instruction materials.   Follow-up Plan The care management team will reach out to the patient again over the next 30 days.   Chong Sicilian, BSN, RN-BC Embedded Chronic Care Manager [facility] / [place] [facility] Direct Dial: [phone]

## 2019-12-13 DIAGNOSIS — B351 Tinea unguium: Secondary | ICD-10-CM | POA: Diagnosis not present

## 2019-12-13 DIAGNOSIS — L84 Corns and callosities: Secondary | ICD-10-CM | POA: Diagnosis not present

## 2019-12-13 DIAGNOSIS — M79676 Pain in unspecified toe(s): Secondary | ICD-10-CM | POA: Diagnosis not present

## 2019-12-13 DIAGNOSIS — E1142 Type 2 diabetes mellitus with diabetic polyneuropathy: Secondary | ICD-10-CM | POA: Diagnosis not present

## 2019-12-16 ENCOUNTER — Telehealth: Payer: Self-pay | Admitting: Pharmacist

## 2019-12-16 NOTE — Telephone Encounter (Signed)
Patient has been out of Gylxambi samples Will substitute with: jardiance 10mg  #21, EUM#[id number], 6/23 tradjenta 5mg  #21, ERX#[id number], 6/23  He is to take 1 tablet by mouth daily (of each pill) Samples left up front in sample cabinet

## 2019-12-20 ENCOUNTER — Ambulatory Visit: Payer: Medicare Other | Admitting: *Deleted

## 2019-12-20 DIAGNOSIS — E1165 Type 2 diabetes mellitus with hyperglycemia: Secondary | ICD-10-CM

## 2019-12-20 NOTE — Chronic Care Management (AMB) (Signed)
  Chronic Care Management   Follow-up Outreach  12/20/2019 Name: Andrew [name] MRN: [id number] DOB: October 12, 1945  Referred by: [name], MD Reason for referral : Chronic Care Management (RN follow up)   An unsuccessful telephone follow-up was attempted today. The patient was referred to the case management team for assistance with care management and care coordination.   RN Care Plan   .  "I need to get samples of Glyxambi" (pt-stated)        CARE PLAN ENTRY (see longitudinal plan of care for additional care plan information)  Current Barriers:  . Care Coordination needs related to medication samples in a patient with diabetes (disease states)  Nurse Case Manager Clinical Goal(s):  Marland Kitchen Over the next 14 days, patient will work with PharmD regarding medication management for diabetes  Interventions:  . Inter-disciplinary care team collaboration (see longitudinal plan of care) . Chart reviewed including recent office notes and lab results . Collaborated with [facility] PharmD o Samples of Glyxambi are still not available o She was able to provide a 21 day supply of Jardiance and Tradjenta, which are the components of Glyxambi. Those are available to be picked up at the front desk . Left message requesting that patient return my call or call Lottie Dawson, PharmD at [phone] regarding [employer] patient assistance for Januvia and to see if he prefers to stay on [facility]  Patient Self Care Activities:  . Performs ADL's independently . Performs IADL's independently  Please see past updates related to this goal by clicking on the "Past Updates" button in the selected goal         Follow Up Plan: A HIPPA compliant phone message was left for the patient providing contact information and requesting a return call.  The care management team will reach out to the patient again over the next 30 days.   Chong Sicilian, BSN, RN-BC Embedded Chronic Care Manager [facility] /  [place] Management Direct Dial: [phone]

## 2020-01-10 ENCOUNTER — Encounter: Payer: Self-pay | Admitting: Family Medicine

## 2020-01-10 ENCOUNTER — Ambulatory Visit (INDEPENDENT_AMBULATORY_CARE_PROVIDER_SITE_OTHER): Payer: Medicare Other | Admitting: Family Medicine

## 2020-01-10 ENCOUNTER — Other Ambulatory Visit: Payer: Self-pay

## 2020-01-10 VITALS — BP 113/67 | HR 75 | Temp 96.9°F | Resp 20 | Ht 68.5 in | Wt 213.4 lb

## 2020-01-10 DIAGNOSIS — E785 Hyperlipidemia, unspecified: Secondary | ICD-10-CM

## 2020-01-10 DIAGNOSIS — I10 Essential (primary) hypertension: Secondary | ICD-10-CM | POA: Diagnosis not present

## 2020-01-10 DIAGNOSIS — E1169 Type 2 diabetes mellitus with other specified complication: Secondary | ICD-10-CM

## 2020-01-10 DIAGNOSIS — E1165 Type 2 diabetes mellitus with hyperglycemia: Secondary | ICD-10-CM | POA: Diagnosis not present

## 2020-01-10 LAB — CBC WITH DIFFERENTIAL/PLATELET
Basophils Absolute: 0.1 10*3/uL (ref 0.0–0.2)
Basos: 1 %
EOS (ABSOLUTE): 0.2 10*3/uL (ref 0.0–0.4)
Eos: 3 %
Hematocrit: 32.4 % — ABNORMAL LOW (ref 37.5–51.0)
Hemoglobin: 11.5 g/dL — ABNORMAL LOW (ref 13.0–17.7)
Immature Grans (Abs): 0 10*3/uL (ref 0.0–0.1)
Immature Granulocytes: 0 %
Lymphocytes Absolute: 2.6 10*3/uL (ref 0.7–3.1)
Lymphs: 41 %
MCH: 30.9 pg (ref 26.6–33.0)
MCHC: 35.5 g/dL (ref 31.5–35.7)
MCV: 87 fL (ref 79–97)
Monocytes Absolute: 0.4 10*3/uL (ref 0.1–0.9)
Monocytes: 6 %
Neutrophils Absolute: 3.1 10*3/uL (ref 1.4–7.0)
Neutrophils: 49 %
Platelets: 261 10*3/uL (ref 150–450)
RBC: 3.72 x10E6/uL — ABNORMAL LOW (ref 4.14–5.80)
RDW: 12.9 % (ref 11.6–15.4)
WBC: 6.3 10*3/uL (ref 3.4–10.8)

## 2020-01-10 LAB — MICROSCOPIC EXAMINATION
Bacteria, UA: NONE SEEN
Epithelial Cells (non renal): NONE SEEN /hpf (ref 0–10)
RBC, Urine: NONE SEEN /hpf (ref 0–2)
Renal Epithel, UA: NONE SEEN /hpf
WBC, UA: NONE SEEN /hpf (ref 0–5)

## 2020-01-10 LAB — LIPID PANEL
Chol/HDL Ratio: 3.9 ratio (ref 0.0–5.0)
Cholesterol, Total: 116 mg/dL (ref 100–199)
HDL: 30 mg/dL — ABNORMAL LOW (ref >39)
LDL Chol Calc (NIH): 58 mg/dL (ref 0–99)
Triglycerides: 162 mg/dL — ABNORMAL HIGH (ref 0–149)
VLDL Cholesterol Cal: 28 mg/dL (ref 5–40)

## 2020-01-10 LAB — CMP14+EGFR
ALT: 18 IU/L (ref 0–44)
AST: 27 IU/L (ref 0–40)
Albumin/Globulin Ratio: 1.4 (ref 1.2–2.2)
Albumin: 3.9 g/dL (ref 3.7–4.7)
Alkaline Phosphatase: 100 IU/L (ref 48–121)
BUN/Creatinine Ratio: 12 (ref 10–24)
BUN: 20 mg/dL (ref 8–27)
Bilirubin Total: 0.3 mg/dL (ref 0.0–1.2)
CO2: 23 mmol/L (ref 20–29)
Calcium: 9 mg/dL (ref 8.6–10.2)
Chloride: 101 mmol/L (ref 96–106)
Creatinine, Ser: 1.61 mg/dL — ABNORMAL HIGH (ref 0.76–1.27)
GFR calc Af Amer: 48 mL/min/{1.73_m2} — ABNORMAL LOW (ref >59)
GFR calc non Af Amer: 42 mL/min/{1.73_m2} — ABNORMAL LOW (ref >59)
Globulin, Total: 2.8 g/dL (ref 1.5–4.5)
Glucose: 128 mg/dL — ABNORMAL HIGH (ref 65–99)
Potassium: 3.9 mmol/L (ref 3.5–5.2)
Sodium: 138 mmol/L (ref 134–144)
Total Protein: 6.7 g/dL (ref 6.0–8.5)

## 2020-01-10 LAB — URINALYSIS, COMPLETE
Bilirubin, UA: NEGATIVE
Ketones, UA: NEGATIVE
Leukocytes,UA: NEGATIVE
Nitrite, UA: NEGATIVE
Protein,UA: NEGATIVE
RBC, UA: NEGATIVE
Specific Gravity, UA: <1.005 — ABNORMAL LOW (ref 1.005–1.030)
Urobilinogen, Ur: 0.2 mg/dL (ref 0.2–1.0)
pH, UA: 5.5 (ref 5.0–7.5)

## 2020-01-10 LAB — BAYER DCA HB A1C WAIVED: HB A1C (BAYER DCA - WAIVED): 6.9 % (ref <7.0)

## 2020-01-10 MED ORDER — NABUMETONE 500 MG PO TABS: 1000.0000 mg | ORAL_TABLET | Freq: Two times a day (BID) | ORAL | 1 refills | Status: DC

## 2020-01-10 NOTE — Progress Notes (Signed)
Subjective:  Patient ID: Andrew [name],  male    DOB: February 01, 1946  Age: 74 y.o.    CC: Medical Management of Chronic Issues   HPI Andrew [name] presents for  follow-up of hypertension. Patient has no history of headache chest pain or shortness of breath or recent cough. Patient also denies symptoms of TIA such as numbness weakness lateralizing. Patient denies side effects from medication. States taking it regularly.  Patient also  in for follow-up of elevated cholesterol. Doing well without complaints on current medication. Denies side effects  including myalgia and arthralgia and nausea. Also in today for liver function testing. Currently no chest pain, shortness of breath or other cardiovascular related symptoms noted.  Follow-up of diabetes. Patient does check blood sugar at home.  Patient denies symptoms such as excessive hunger or urinary frequency, excessive hunger, nausea No significant hypoglycemic spells noted. Medications reviewed. Pt reports taking them regularly. Pt. denies complication/adverse reaction today.    History Andrew [name] has a past medical history of Anemia, BPH (benign prostatic hyperplasia), Cataract, Diabetes mellitus without complication ([place]), Hyperlipidemia, Hypertension, Low serum vitamin D, and Prostate cancer ([place]) (2008).   He has a past surgical history that includes Prostate surgery (2008) and Colonoscopy (3419,3790).   His family history includes Colon polyps in his brother; Deep vein thrombosis in his sister; Diabetes in his mother; Emphysema in his father; Gout in his brother; Heart disease in his mother; Hypertension in his brother; Kidney disease in his mother and sister; Lung cancer in his sister.He reports that he has been smoking cigarettes. He started smoking about 54 years ago. He has a 55.00 pack-year smoking history. He has never used smokeless tobacco. He reports that he does not drink alcohol and does not use drugs.  Current Outpatient Medications  on File Prior to Visit  Medication Sig Dispense Refill  . aspirin 81 MG tablet Take 81 mg by mouth daily.    Marland Kitchen atorvastatin (LIPITOR) 40 MG tablet TAKE 1 TABLET BY MOUTH  DAILY 90 tablet 1  . cholecalciferol (VITAMIN D) 1000 UNITS tablet Take 1,000 Units by mouth daily.     . Empagliflozin-linaGLIPtin (GLYXAMBI) 10-5 MG TABS Take 1 tablet by mouth daily.    Marland Kitchen glucose blood test strip 1 each by Other route as needed for other. Use as instructed to test blood sugar daily.  One Touch Verio Test Strips DX: E11.65 100 each 11  . lisinopril-hydrochlorothiazide (ZESTORETIC) 20-25 MG tablet TAKE 1 TABLET BY MOUTH  DAILY 90 tablet 1  . metFORMIN (GLUCOPHAGE) 1000 MG tablet TAKE 1 TABLET BY MOUTH  TWICE DAILY WITH MEALS 180 tablet 0  . tamsulosin (FLOMAX) 0.4 MG CAPS capsule TAKE 1 CAPSULE BY MOUTH  DAILY 90 capsule 0  . traZODone (DESYREL) 150 MG tablet Use from 1/3 to 1 tablet nightly as needed for sleep. 30 tablet 5  . albuterol (VENTOLIN HFA) 108 (90 Base) MCG/ACT inhaler Inhale 2 puffs into the lungs every 6 (six) hours as needed for wheezing or shortness of breath. (Patient not taking: Reported on 01/10/2020) 18 g 2  . Cinnamon 500 MG capsule Take 500 mg by mouth daily as needed (blood glucose levels).    . sildenafil (VIAGRA) 25 MG tablet Take 25 mg by mouth daily as needed for erectile dysfunction. (Patient not taking: Reported on 01/10/2020)    . Sodium Sulfate-Mag Sulfate-KCl (SUTAB) [phone] MG TABS Take 1 kit by mouth as directed. (Patient not taking: Reported on 01/10/2020) 24 tablet 0  .  tadalafil (CIALIS) 20 MG tablet Take 20 mg by mouth daily as needed for erectile dysfunction. (Patient not taking: Reported on 01/10/2020)     No current facility-administered medications on file prior to visit.    ROS Review of Systems  Constitutional: Negative for fever.  Respiratory: Negative for shortness of breath.   Cardiovascular: Negative for chest pain.  Musculoskeletal: Negative for  arthralgias.  Skin: Negative for rash.    Objective:  BP 113/67   Pulse 75   Temp (!) 96.9 F (36.1 C) (Temporal)   Resp 20   Ht 5' 8.5" (1.74 m)   Wt 213 lb 6 oz (96.8 kg)   SpO2 99%   BMI 31.97 kg/m   BP Readings from Last 3 Encounters:  01/10/20 113/67  11/07/19 119/72  11/01/19 123/65    Wt Readings from Last 3 Encounters:  01/10/20 213 lb 6 oz (96.8 kg)  11/01/19 216 lb (98 kg)  10/18/19 216 lb (98 kg)     Physical Exam Vitals reviewed.  Constitutional:      Appearance: He is well-developed.  HENT:     Head: Normocephalic and atraumatic.     Right Ear: Tympanic membrane and external ear normal. No decreased hearing noted.     Left Ear: Tympanic membrane and external ear normal. No decreased hearing noted.     Mouth/Throat:     Pharynx: No oropharyngeal exudate or posterior oropharyngeal erythema.  Eyes:     Pupils: Pupils are equal, round, and reactive to light.  Cardiovascular:     Rate and Rhythm: Normal rate and regular rhythm.     Heart sounds: No murmur heard.   Pulmonary:     Effort: No respiratory distress.     Breath sounds: Normal breath sounds.  Abdominal:     General: Bowel sounds are normal.     Palpations: Abdomen is soft. There is no mass.     Tenderness: There is no abdominal tenderness.  Musculoskeletal:     Cervical back: Normal range of motion and neck supple.         Assessment & Plan:   Andrew [name] was seen today for medical management of chronic issues.  Diagnoses and all orders for this visit:  Uncontrolled type 2 diabetes mellitus with hyperglycemia ([place]) -     Bayer DCA Hb A1c Waived -     CBC with Differential/Platelet -     CMP14+EGFR -     Lipid panel -     Urinalysis, Complete -     Microscopic Examination  Essential hypertension -     CBC with Differential/Platelet -     CMP14+EGFR -     Lipid panel -     Urinalysis, Complete -     Microscopic Examination  Hyperlipidemia associated with type 2 diabetes  mellitus ([facility]) -     CBC with Differential/Platelet -     CMP14+EGFR -     Lipid panel -     Urinalysis, Complete -     Microscopic Examination  Other orders -     nabumetone (RELAFEN) 500 MG tablet; Take 2 tablets (1,000 mg total) by mouth 2 (two) times daily. For muscle and joint pain   I am having [name] start on nabumetone. I am also having Andrew [name] maintain his aspirin, cholecalciferol, Cinnamon, traZODone, albuterol, glucose blood, tadalafil, sildenafil, Sutab, tamsulosin, lisinopril-hydrochlorothiazide, metFORMIN, atorvastatin, and Glyxambi.  Meds ordered this encounter  Medications  . nabumetone (RELAFEN) 500 MG tablet  Sig: Take 2 tablets (1,000 mg total) by mouth 2 (two) times daily. For muscle and joint pain    Dispense:  360 tablet    Refill:  1     Follow-up: Return in about 3 months (around 04/11/2020).  [name], M.D.

## 2020-01-11 ENCOUNTER — Encounter: Payer: Self-pay | Admitting: Family Medicine

## 2020-01-16 ENCOUNTER — Ambulatory Visit (INDEPENDENT_AMBULATORY_CARE_PROVIDER_SITE_OTHER): Payer: Medicare Other | Admitting: *Deleted

## 2020-01-16 DIAGNOSIS — I10 Essential (primary) hypertension: Secondary | ICD-10-CM | POA: Diagnosis not present

## 2020-01-16 DIAGNOSIS — E1165 Type 2 diabetes mellitus with hyperglycemia: Secondary | ICD-10-CM

## 2020-01-16 NOTE — Patient Instructions (Signed)
Visit Information  Goals Addressed              This Visit's Progress     Patient Stated   .  "I need help getting my medicine" (pt-stated)        CARE PLAN ENTRY (see longitudinal plan of care for additional care plan information)  Current Barriers:  . Care Coordination needs related to medication samples in a patient with diabetes (disease states)  Nurse Case Manager Clinical Goal(s):  Marland Kitchen Over the next 14 days, patient will work with Clinical Pharmacist regarding prescription assistance for Glyxambi  Interventions:  . Inter-disciplinary care team collaboration (see longitudinal plan of care) . Chart reviewed including recent office notes and lab results . Verified that patient was able to pickup Januvia and Jardiance samples . Previously collaborated with Lottie Dawson, [facility] regarding medication management . Discussed that patient prefers Glyxambi if it's possible to get that . Collaborated with front office staff to schedule patient a visit with Lottie Dawson, [facility] for tomorrow to complete prescription assistance forms for glyxambi . Advised patient to bring financial documents to appointment . Notified Almyra Free of upcoming appointment . Encouraged patient to reach out to [facility] with any nursing care needs  Patient Self Care Activities:  . Performs ADL's independently . Performs IADL's independently  Please see past updates related to this goal by clicking on the "Past Updates" button in the selected goal      .  "I want to keep my blood sugar under control" (pt-stated)        CARE PLAN ENTRY (see longitudinal plan of care for additional care plan information)  Current Barriers:  . 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   Nurse Case Manager Clinical Goal(s):  Marland Kitchen Over the next 60 days, patient will work with Consulting civil engineer to address needs related to diabetes . Over the next 30 days, patient will work with clinical  pharmacist to complete prescription assistance forms . Over the next 90 days, patient will keep all scheduled medical appointments  Interventions:  . Inter-disciplinary care team collaboration (see longitudinal plan of care) . Evaluation of current treatment plan related to diabetes and patient's adherence to plan as established by provider. . Chart reviewed including recent office notes and lab results . Discussed A1C improvement . Reviewed medications with patient and discussed Januvia/Jardiance vs Glyxambi . Collaborated with Lottie Dawson, [facility] regarding prescription assistance for glyxambi . Discussed plans with patient for ongoing care management follow up and provided patient with direct contact information for care management team . Advised patient, providing education and rationale, to check cbg daily and record, calling [phone] for findings outside established parameters.    Patient Self Care Activities:  . Performs ADL's independently . Performs IADL's independently  Initial goal documentation        Patient verbalizes understanding of instructions provided today.   Follow-up Plan The care management team will reach out to the patient again over the next 60 days.   Chong Sicilian, BSN, RN-BC Embedded Chronic Care Manager [facility] / [place] Management Direct Dial: [phone]

## 2020-01-16 NOTE — Chronic Care Management (AMB) (Signed)
Chronic Care Management   Follow Up Note   01/16/2020 Name: Andrew [name] MRN: [id number] DOB: Jul 13, 1945  Referred by: [name], MD Reason for referral : Chronic Care Management (RN follow up)   Andrew [name] is a 74 y.o. year old male who is a primary care patient of Stacks, Cletus Gash, MD. The CCM team was consulted for assistance with chronic disease management and care coordination needs.    Review of patient status, including review of consultants reports, relevant laboratory and other test results, and collaboration with appropriate care team members and the patient's provider was performed as part of comprehensive patient evaluation and provision of chronic care management services.    SDOH (Social Determinants of Health) assessments performed: Yes-prescription assistance See Care Plan activities for detailed interventions related to [facility])     Outpatient Encounter Medications as of 01/16/2020  Medication Sig Note  . albuterol (VENTOLIN HFA) 108 (90 Base) MCG/ACT inhaler Inhale 2 puffs into the lungs every 6 (six) hours as needed for wheezing or shortness of breath. (Patient not taking: Reported on 01/10/2020)   . aspirin 81 MG tablet Take 81 mg by mouth daily.   Marland Kitchen atorvastatin (LIPITOR) 40 MG tablet TAKE 1 TABLET BY MOUTH  DAILY   . cholecalciferol (VITAMIN D) 1000 UNITS tablet Take 1,000 Units by mouth daily.    . Cinnamon 500 MG capsule Take 500 mg by mouth daily as needed (blood glucose levels).   . Empagliflozin-linaGLIPtin (GLYXAMBI) 10-5 MG TABS Take 1 tablet by mouth daily. 11/07/2019: #21 tabs [id number] A EXP 3/22  . glucose blood test strip 1 each by Other route as needed for other. Use as instructed to test blood sugar daily.  One Touch Verio Test Strips DX: E11.65 10/17/2019: One Touch Verio  . lisinopril-hydrochlorothiazide (ZESTORETIC) 20-25 MG tablet TAKE 1 TABLET BY MOUTH  DAILY   . metFORMIN (GLUCOPHAGE) 1000 MG tablet TAKE 1 TABLET BY MOUTH  TWICE DAILY WITH MEALS     . nabumetone (RELAFEN) 500 MG tablet Take 2 tablets (1,000 mg total) by mouth 2 (two) times daily. For muscle and joint pain   . sildenafil (VIAGRA) 25 MG tablet Take 25 mg by mouth daily as needed for erectile dysfunction. (Patient not taking: Reported on 01/10/2020)   . Sodium Sulfate-Mag Sulfate-KCl (SUTAB) [phone] MG TABS Take 1 kit by mouth as directed. (Patient not taking: Reported on 01/10/2020)   . tadalafil (CIALIS) 20 MG tablet Take 20 mg by mouth daily as needed for erectile dysfunction. (Patient not taking: Reported on 01/10/2020)   . tamsulosin (FLOMAX) 0.4 MG CAPS capsule TAKE 1 CAPSULE BY MOUTH  DAILY   . traZODone (DESYREL) 150 MG tablet Use from 1/3 to 1 tablet nightly as needed for sleep.    No facility-administered encounter medications on file as of 01/16/2020.     Lab Results  Component Value Date   HGBA1C 6.9 01/10/2020   HGBA1C 7.1 (H) 10/11/2019   HGBA1C 6.2 07/05/2019   Lab Results  Component Value Date   MICROALBUR 100 11/21/2014   LDLCALC 58 01/10/2020   CREATININE 1.61 (H) 01/10/2020     RN Care Plan             This Visit's Progress     Patient Stated   .  "I need help getting my medicine" (pt-stated)        CARE PLAN ENTRY (see longitudinal plan of care for additional care plan information)  Current Barriers:  . Care Coordination needs related  to medication samples in a patient with diabetes (disease states)  Nurse Case Manager Clinical Goal(s):  Marland Kitchen Over the next 14 days, patient will work with Clinical Pharmacist regarding prescription assistance for Glyxambi  Interventions:  . Inter-disciplinary care team collaboration (see longitudinal plan of care) . Chart reviewed including recent office notes and lab results . Verified that patient was able to pickup Januvia and Jardiance samples . Previously collaborated with Lottie Dawson, [facility] regarding medication management . Discussed that patient prefers Glyxambi if it's possible to get  that . Collaborated with front office staff to schedule patient a visit with Lottie Dawson, [facility] for tomorrow to complete prescription assistance forms for glyxambi . Advised patient to bring financial documents to appointment . Notified Almyra Free of upcoming appointment . Encouraged patient to reach out to [facility] with any nursing care needs  Patient Self Care Activities:  . Performs ADL's independently . Performs IADL's independently  Please see past updates related to this goal by clicking on the "Past Updates" button in the selected goal      .  "I want to keep my blood sugar under control" (pt-stated)        CARE PLAN ENTRY (see longitudinal plan of care for additional care plan information)  Current Barriers:  . 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   Nurse Case Manager Clinical Goal(s):  Marland Kitchen Over the next 60 days, patient will work with Consulting civil engineer to address needs related to diabetes . Over the next 30 days, patient will work with clinical pharmacist to complete prescription assistance forms . Over the next 90 days, patient will keep all scheduled medical appointments  Interventions:  . Inter-disciplinary care team collaboration (see longitudinal plan of care) . Evaluation of current treatment plan related to diabetes and patient's adherence to plan as established by provider. . Chart reviewed including recent office notes and lab results . Discussed A1C improvement . Reviewed medications with patient and discussed Januvia/Jardiance vs Glyxambi . Collaborated with Lottie Dawson, [facility] regarding prescription assistance for glyxambi . Discussed plans with patient for ongoing care management follow up and provided patient with direct contact information for care management team . Advised patient, providing education and rationale, to check cbg daily and record, calling [phone] for findings outside established parameters.     Patient Self Care Activities:  . Performs ADL's independently . Performs IADL's independently  Initial goal documentation         Plan:   The care management team will reach out to the patient again over the next 60 days.    Chong Sicilian, BSN, RN-BC Embedded Chronic Care Manager [facility] / [place] Management Direct Dial: [phone]

## 2020-01-17 ENCOUNTER — Ambulatory Visit (INDEPENDENT_AMBULATORY_CARE_PROVIDER_SITE_OTHER): Payer: Medicare Other | Admitting: Pharmacist

## 2020-01-17 ENCOUNTER — Other Ambulatory Visit: Payer: Self-pay

## 2020-01-17 DIAGNOSIS — E1165 Type 2 diabetes mellitus with hyperglycemia: Secondary | ICD-10-CM

## 2020-01-17 NOTE — Progress Notes (Signed)
    01/17/2020 Name: Andrew [name] MRN: [id number] DOB: 24-Feb-1946   S:73 yoM with PMH significant for DM, HTN, HLD, current smokerarrives to pharmacy clinic today for diabetes evaluation, education, and management.Patient was referred and last seen by Primary Care Provider on4/13/21.Patient is also a current smoker, therefore we will continue explore smoking cessation. Insurance coverage/medication affordability:UHCmedicare   T2DM-A1c 7.1%, BGs worsening per patient due to cost issues (has previously had A1cs in the 6%s)  Most recent eGFR:59  Current antihyperglycemic regimen:Metformin 1g BID,Glyxambi 10/52m daily  Denies hypoglycemic symptoms  Reportshyperglycemic symptoms, including polyuria, nocturia  Current meal patterns:discussed eating lower carb, increase vegetable (non-starchy) intake, protein. Avoid sugary drinks, etc, Limit dessert intake.  Current exercise:n/a, encouraged patient to increase physical activity as able Current blood glucose readings:fasting blood sugars:150-180s,FBG this AM in clinic was 160  Cardiovascular risk reduction: ? Current hypertensive regimen:lisinopril 20/hctz 268mdaily ? Current hyperlipidemia regimen:atorvastatin 40(LDL 59on 10/11/19) ? Current antiplatelet regimen:ASA 81  Patient denieshypoglycemic events.  Patient-reported exercise habits: n/a   O:  Lab Results  Component Value Date   HGBA1C 6.9 01/10/2020    Lipid Panel     Component Value Date/Time   CHOL 116 01/10/2020 1020   CHOL 144 11/18/2012 0844   TRIG 162 (H) 01/10/2020 1020   TRIG 142 01/03/2016 0800   TRIG 161 (H) 11/18/2012 0844   HDL 30 (L) 01/10/2020 1020   HDL 31 (L) 01/03/2016 0800   HDL 27 (L) 11/18/2012 0844   CHOLHDL 3.9 01/10/2020 1020   LDLCALC 58 01/10/2020 1020   LDLCALC 45 03/08/2014 0810   LDLCALC 85 11/18/2012 0844     Home fasting blood sugars: 116, 110s  2 hour post-meal/random blood sugars: n/a.     A/P:  Diabetes T2DM currently controlled with the assistance of samples and patient assistance.  Patient has been able to maintain his goal A1c<7%.   Patient is adherent with medication.   -Continue Glyxambi 1066/2HUaily  Application for patient assistance filled out for BI cares  Awaiting patient's financials  -Continue metformin  -Extensively discussed pathophysiology of diabetes, recommended lifestyle interventions, dietary effects on blood sugar control  -Counseled on s/sx of and management of hypoglycemia  -Next A1C anticipated 3-6 months.   Written patient instructions provided.  Total time in face to face counseling 30 minutes.   Follow up PCP Clinic Visit on 3-6 months.   JuRegina EckPharmD, BCPS Clinical Pharmacist, [facility] Phone [phone]

## 2020-01-18 ENCOUNTER — Telehealth: Payer: Self-pay | Admitting: Pharmacist

## 2020-01-18 NOTE — Telephone Encounter (Signed)
Patient assistance application faxed to [facility] for [facility] Patient is doing well on this medication  He is at goal A1c

## 2020-01-24 ENCOUNTER — Ambulatory Visit (INDEPENDENT_AMBULATORY_CARE_PROVIDER_SITE_OTHER): Payer: Medicare Other | Admitting: *Deleted

## 2020-01-24 DIAGNOSIS — Z0000 Encounter for general adult medical examination without abnormal findings: Secondary | ICD-10-CM | POA: Diagnosis not present

## 2020-01-24 NOTE — Progress Notes (Signed)
MEDICARE ANNUAL WELLNESS VISIT  01/24/2020   Provider calling from [facility], patient participating in call from home.   Telephone Visit Disclaimer This Medicare AWV was conducted by telephone due to national recommendations for restrictions regarding the COVID-19 Pandemic (e.g. social distancing).  I verified, using two identifiers, that I am speaking with Andrew [name] or their authorized healthcare agent. I discussed the limitations, risks, security, and privacy concerns of performing an evaluation and management service by telephone and the potential availability of an in-person appointment in the future. The patient expressed understanding and agreed to proceed.   Subjective:  Andrew [name] is a 74 y.o. male patient of Stacks, Cletus Gash, MD who had a Medicare Annual Wellness Visit today via telephone. Andrew [name] is retired and lives with his wife [name]. They have 2 grown children. He reports that he is socially active and does interact with friends/family regularly. He is minimally physically active and enjoys gardening.  Patient Care Team: Andrew Fraise, MD as PCP - General (Family Medicine) Andrew Breeding, MD as Consulting Physician (Cardiology) McKenzie, Candee Furbish, MD as Consulting Physician (Urology) McKenzie, Candee Furbish, MD as Consulting Physician (Urology) Andrew China, RN as Registered Nurse Lavera Guise, [facility] (Pharmacist)  Advanced Directives 01/24/2020 01/20/2019 01/11/2018 07/02/2017 01/05/2017 09/08/2014 04/18/2014  Does Patient Have a Medical Advance Directive? No No No No No No No  Would patient like information on creating a medical advance directive? No - Patient declined No - Patient declined Yes (MAU/Ambulatory/Procedural Areas - Information given) - Yes (MAU/Ambulatory/Procedural Areas - Information given) No - patient declined information No - patient declined information    Hospital Utilization Over the Past 12 Months: # of hospitalizations or ER visits: 0 # of surgeries:  0  Review of Systems    Patient reports that his overall health is unchanged compared to last year.  History obtained from the patient and patient chart.   Patient Reported Readings (BP, Pulse, CBG, Weight, etc) none  Pain Assessment Pain : No/denies pain     Current Medications & Allergies (verified) Allergies as of 01/24/2020   No Known Allergies     Medication List       Accurate as of January 24, 2020 10:51 AM. If you have any questions, ask your nurse or doctor.        albuterol 108 (90 Base) MCG/ACT inhaler Commonly known as: VENTOLIN HFA Inhale 2 puffs into the lungs every 6 (six) hours as needed for wheezing or shortness of breath.   aspirin 81 MG tablet Take 81 mg by mouth daily.   atorvastatin 40 MG tablet Commonly known as: LIPITOR TAKE 1 TABLET BY MOUTH  DAILY   cholecalciferol 1000 units tablet Commonly known as: VITAMIN D Take 1,000 Units by mouth daily.   Cinnamon 500 MG capsule Take 500 mg by mouth daily as needed (blood glucose levels).   glucose blood test strip 1 each by Other route as needed for other. Use as instructed to test blood sugar daily.  One Touch Verio Test Strips DX: E11.65   Glyxambi 10-5 MG Tabs Generic drug: Empagliflozin-linaGLIPtin Take 1 tablet by mouth daily.   lisinopril-hydrochlorothiazide 20-25 MG tablet Commonly known as: ZESTORETIC TAKE 1 TABLET BY MOUTH  DAILY   metFORMIN 1000 MG tablet Commonly known as: GLUCOPHAGE TAKE 1 TABLET BY MOUTH  TWICE DAILY WITH MEALS   nabumetone 500 MG tablet Commonly known as: RELAFEN Take 2 tablets (1,000 mg total) by mouth 2 (two) times daily. For  muscle and joint pain   sildenafil 25 MG tablet Commonly known as: VIAGRA Take 25 mg by mouth daily as needed for erectile dysfunction.   Sutab [phone] MG Tabs Generic drug: Sodium Sulfate-Mag Sulfate-KCl Take 1 kit by mouth as directed.   tadalafil 20 MG tablet Commonly known as: CIALIS Take 20 mg by mouth daily as needed  for erectile dysfunction.   tamsulosin 0.4 MG Caps capsule Commonly known as: FLOMAX TAKE 1 CAPSULE BY MOUTH  DAILY   traZODone 150 MG tablet Commonly known as: DESYREL Use from 1/3 to 1 tablet nightly as needed for sleep.       History (reviewed): Past Medical History:  Diagnosis Date  . Anemia   . BPH (benign prostatic hyperplasia)    Sees Dr [name]  . Cataract   . Diabetes mellitus without complication ([place])   . Hyperlipidemia   . Hypertension   . Low serum vitamin D   . Prostate cancer ([place]) 2008   w/seed implantation and radiation   Past Surgical History:  Procedure Laterality Date  . COLONOSCOPY  [id number]  . PROSTATE SURGERY  2008   seed implant   Family History  Problem Relation Age of Onset  . Diabetes Mother   . Heart disease Mother   . Kidney disease Mother        DIALYSIS  . Emphysema Father   . Deep vein thrombosis Sister   . Kidney disease Sister   . Lung cancer Sister   . Colon polyps Brother   . Hypertension Brother   . Gout Brother   . Colon cancer Neg Hx   . Esophageal cancer Neg Hx   . Rectal cancer Neg Hx   . Prostate cancer Neg Hx    Social History   Socioeconomic History  . Marital status: Married    Spouse name: Mary  . Number of children: 2  . Years of education: 10  . Highest education level: 12th grade  Occupational History  . Occupation: [name]    Comment: Retired Geologist, engineering  Tobacco Use  . Smoking status: Current Every Day Smoker    Packs/day: 1.00    Years: 55.00    Pack years: 55.00    Types: Cigarettes    Start date: 06/30/1965  . Smokeless tobacco: Never Used  . Tobacco comment: Has tried nicotine replacement gum  Vaping Use  . Vaping Use: Never used  Substance and Sexual Activity  . Alcohol use: No  . Drug use: No  . Sexual activity: Yes  Other Topics Concern  . Not on file  Social History Narrative   Retired, lives at home with wife [name], two grown children, enjoys gardening     Social  Determinants of Health   Financial Resource Strain: Low Risk   . Difficulty of Paying Living Expenses: Not hard at all  Food Insecurity: No Food Insecurity  . Worried About Charity fundraiser in the Last Year: Never true  . Ran Out of Food in the Last Year: Never true  Transportation Needs: No Transportation Needs  . Lack of Transportation (Medical): No  . Lack of Transportation (Non-Medical): No  Physical Activity: Inactive  . Days of Exercise per Week: 0 days  . Minutes of Exercise per Session: 0 min  Stress: No Stress Concern Present  . Feeling of Stress : Not at all  Social Connections: Socially Integrated  . Frequency of Communication with Friends and Family: More than three times a week  .  Frequency of Social Gatherings with Friends and Family: More than three times a week  . Attends Religious Services: More than 4 times per year  . Active Member of Clubs or Organizations: Yes  . Attends Archivist Meetings: More than 4 times per year  . Marital Status: Married    Activities of Daily Living In your present state of health, do you have any difficulty performing the following activities: 01/24/2020  Hearing? Y  Comment some hearing loss in left ear  Vision? N  Difficulty concentrating or making decisions? N  Walking or climbing stairs? N  Dressing or bathing? N  Doing errands, shopping? N  Preparing Food and eating ? N  Using the Toilet? N  In the past six months, have you accidently leaked urine? N  Do you have problems with loss of bowel control? N  Managing your Medications? N  Managing your Finances? N  Housekeeping or managing your Housekeeping? N  Some recent data might be hidden    Patient Education/ Literacy What is the last grade level you completed in school?: 12th  Exercise Current Exercise Habits: The patient does not participate in regular exercise at present, Exercise limited by: None identified  Diet Patient reports consuming 2 meals a  day and 1 snack(s) a day Patient reports that his primary diet is: Regular Patient reports that she does have regular access to food.   Depression Screen PHQ 2/9 Scores 01/24/2020 01/10/2020 10/11/2019 07/05/2019 01/20/2019 08/11/2018 04/06/2018  PHQ - 2 Score 0 0 0 0 0 0 0  PHQ- 9 Score - - - - - - -     Fall Risk Fall Risk  01/24/2020 01/10/2020 10/11/2019 07/05/2019 01/20/2019  Falls in the past year? 0 0 0 0 0  Number falls in past yr: - - - - 0  Injury with Fall? - - - - 0  Risk for fall due to : - - - - Other (Comment)  Risk for fall due to: Comment - - - - pt has not had any falls in the past 12 months  Follow up - Falls evaluation completed - - Falls prevention discussed  Comment - - - - discussed keeping throw rugs out of the house, adequate lighting in hallways, grab bars in restrooms     Objective:  Andrew [name] seemed alert and oriented and he participated appropriately during our telephone visit.  Blood Pressure Weight BMI  BP Readings from Last 3 Encounters:  01/10/20 113/67  11/07/19 119/72  11/01/19 123/65   Wt Readings from Last 3 Encounters:  01/10/20 213 lb 6 oz (96.8 kg)  11/01/19 216 lb (98 kg)  10/18/19 216 lb (98 kg)   BMI Readings from Last 1 Encounters:  01/10/20 31.97 kg/m    *Unable to obtain current vital signs, weight, and BMI due to telephone visit type  Hearing/Vision  . Andrew [name] did not seem to have difficulty with hearing/understanding during the telephone conversation . Reports that he has had a formal eye exam by an eye care professional within the past year . Reports that he has not had a formal hearing evaluation within the past year *Unable to fully assess hearing and vision during telephone visit type  Cognitive Function: 6CIT Screen 01/24/2020 01/20/2019  What Year? 0 points 0 points  What month? 0 points 0 points  What time? 0 points 0 points  Count back from 20 0 points 0 points  Months in reverse 0 points 0 points  Repeat  phrase 2 points 2  points  Total Score 2 2   (Normal:0-7, Significant for Dysfunction: >8)  Normal Cognitive Function Screening: Yes   Immunization & Health Maintenance Record Immunization History  Administered Date(s) Administered  . Fluad Quad(high Dose 65+) 04/06/2019  . Influenza, High Dose Seasonal PF 05/13/2016, 04/14/2017, 04/06/2018  . Influenza,inj,Quad PF,6+ Mos 04/18/2013, 04/07/2014, 04/06/2015  . Moderna SARS-COVID-2 Vaccination 08/01/2019, 08/29/2019  . Pneumococcal Conjugate-13 02/27/2014  . Pneumococcal Polysaccharide-23 11/16/2012    Health Maintenance  Topic Date Due  . Hepatitis C Screening  Never done  . COLON CANCER SCREENING ANNUAL FOBT  08/12/2019  . TETANUS/TDAP  10/10/2020 (Originally 10/26/1964)  . INFLUENZA VACCINE  01/29/2020  . OPHTHALMOLOGY EXAM  05/30/2020  . HEMOGLOBIN A1C  07/12/2020  . FOOT EXAM  10/10/2020  . COLONOSCOPY  11/01/2022  . COVID-19 Vaccine  Completed  . PNA vac Low Risk Adult  Completed       Assessment  This is a routine wellness examination for Andrew [name].  Health Maintenance: Due or Overdue Health Maintenance Due  Topic Date Due  . Hepatitis C Screening  Never done  . COLON CANCER SCREENING ANNUAL FOBT  08/12/2019    Andrew [name] does not need a referral for Community Assistance: Care Management:   No Social Work:    no Prescription Assistance:  no Nutrition/Diabetes Education:  no   Plan:  Personalized Goals Goals Addressed            This Visit's Progress   . Patient Stated       01/24/2020 AWV Goal: Exercise for General Health   Patient will verbalize understanding of the benefits of increased physical activity:  Exercising regularly is important. It will improve your overall fitness, flexibility, and endurance.  Regular exercise also will improve your overall health. It can help you control your weight, reduce stress, and improve your bone density.  Over the next year, patient will increase physical activity as  tolerated with a goal of at least 150 minutes of moderate physical activity per week.   You can tell that you are exercising at a moderate intensity if your heart starts beating faster and you start breathing faster but can still hold a conversation.  Moderate-intensity exercise ideas include:  Walking 1 mile (1.6 km) in about 15 minutes  Biking  Hiking  Golfing  Dancing  Water aerobics  Patient will verbalize understanding of everyday activities that increase physical activity by providing examples like the following: ? Yard work, such as: ? Pushing a Conservation officer, nature ? Raking and bagging leaves ? Washing your car ? Pushing a stroller ? Shoveling snow ? Gardening ? Washing windows or floors  Patient will be able to explain general safety guidelines for exercising:   Before you start a new exercise program, talk with your health care provider.  Do not exercise so much that you hurt yourself, feel dizzy, or get very short of breath.  Wear comfortable clothes and wear shoes with good support.  Drink plenty of water while you exercise to prevent dehydration or heat stroke.  Work out until your breathing and your heartbeat get faster.       Personalized Health Maintenance & Screening Recommendations  Colorectal cancer screening  Lung Cancer Screening Recommended: no (Low Dose CT Chest recommended if Age 10-80 years, 30 pack-year currently smoking OR have quit w/in past 15 years) Hepatitis C Screening recommended: yes HIV Screening recommended: no  Advanced Directives: Written information was not prepared  per patient's request.  Referrals & Orders No orders of the defined types were placed in this encounter.   Follow-up Plan . Follow-up with Andrew Fraise, MD as planned  I have personally reviewed and noted the following in the patient's chart:   . Medical and social history . Use of alcohol, tobacco or illicit drugs  . Current medications and  supplements . Functional ability and status . Nutritional status . Physical activity . Advanced directives . List of other physicians . Hospitalizations, surgeries, and ER visits in previous 12 months . Vitals . Screenings to include cognitive, depression, and falls . Referrals and appointments  In addition, I have reviewed and discussed with Andrew [name] certain preventive protocols, quality metrics, and best practice recommendations. A written personalized care plan for preventive services as well as general preventive health recommendations is available and can be mailed to the patient at his request.      Baldomero Lamy, LPN [id number]

## 2020-01-25 ENCOUNTER — Other Ambulatory Visit: Payer: Self-pay | Admitting: Family Medicine

## 2020-01-25 DIAGNOSIS — E1165 Type 2 diabetes mellitus with hyperglycemia: Secondary | ICD-10-CM

## 2020-02-08 ENCOUNTER — Ambulatory Visit (INDEPENDENT_AMBULATORY_CARE_PROVIDER_SITE_OTHER): Payer: Medicare Other | Admitting: Urology

## 2020-02-08 ENCOUNTER — Ambulatory Visit: Payer: Medicare Other | Admitting: Urology

## 2020-02-08 ENCOUNTER — Other Ambulatory Visit: Payer: Self-pay

## 2020-02-08 ENCOUNTER — Encounter: Payer: Self-pay | Admitting: Urology

## 2020-02-08 VITALS — BP 115/70 | HR 73 | Temp 98.2°F | Ht 68.5 in | Wt 231.4 lb

## 2020-02-08 DIAGNOSIS — N401 Enlarged prostate with lower urinary tract symptoms: Secondary | ICD-10-CM

## 2020-02-08 DIAGNOSIS — C61 Malignant neoplasm of prostate: Secondary | ICD-10-CM

## 2020-02-08 DIAGNOSIS — N138 Other obstructive and reflux uropathy: Secondary | ICD-10-CM

## 2020-02-08 DIAGNOSIS — C775 Secondary and unspecified malignant neoplasm of intrapelvic lymph nodes: Secondary | ICD-10-CM | POA: Diagnosis not present

## 2020-02-08 DIAGNOSIS — R351 Nocturia: Secondary | ICD-10-CM | POA: Diagnosis not present

## 2020-02-08 LAB — URINALYSIS, ROUTINE W REFLEX MICROSCOPIC
Bilirubin, UA: NEGATIVE
Ketones, UA: NEGATIVE
Leukocytes,UA: NEGATIVE
Nitrite, UA: NEGATIVE
Protein,UA: NEGATIVE
RBC, UA: NEGATIVE
Specific Gravity, UA: 1.015 (ref 1.005–1.030)
Urobilinogen, Ur: 0.2 mg/dL (ref 0.2–1.0)
pH, UA: 5.5 (ref 5.0–7.5)

## 2020-02-08 MED ORDER — ALFUZOSIN HCL ER 10 MG PO TB24
10.0000 mg | ORAL_TABLET | Freq: Every day | ORAL | 11 refills | Status: DC
Start: 2020-02-08 — End: 2020-03-19

## 2020-02-08 MED ORDER — LEUPROLIDE ACETATE (6 MONTH) 45 MG IM KIT
45.0000 mg | PACK | Freq: Once | INTRAMUSCULAR | Status: AC
  Administered 2020-02-08: 45 mg via INTRAMUSCULAR

## 2020-02-08 MED ORDER — CLOTRIMAZOLE-BETAMETHASONE 1-0.05 % EX CREA: 1.0000 "application " | TOPICAL_CREAM | Freq: Two times a day (BID) | CUTANEOUS | 0 refills | Status: DC

## 2020-02-08 NOTE — Progress Notes (Signed)
02/08/2020 9:31 AM   Andrew [name] Aug 20, 1945 [id number]  Referring provider: [name], MD Andrew [name],  Andrew [name] [id number]  Prostate cancer and nocturia  HPI: Andrew [name] is a 74yo here for followup for metastatic prostate cancer and nocturia. No recent PSA. He is due for lupron today. Mild occasional hot flashes. NO new bone pain. He is on flomax 0.4mg  for BPH with nocturia.  He has nocturia 2-3, fair stream, urinary frequency every 2 hours, urinary urgency. Rare urge incontinence   PMH: Past Medical History:  Diagnosis Date  . Anemia   . BPH (benign prostatic hyperplasia)    Sees Andrew [name]  . Cataract   . Diabetes mellitus without complication ([place])   . Hyperlipidemia   . Hypertension   . Low serum vitamin D   . Prostate cancer ([place]) 2008   w/seed implantation and radiation    Surgical History: Past Surgical History:  Procedure Laterality Date  . COLONOSCOPY  [id number]  . PROSTATE SURGERY  2008   seed implant    Home Medications:  Allergies as of 02/08/2020   No Known Allergies     Medication List       Accurate as of February 08, 2020  9:31 AM. If you have any questions, ask your nurse or doctor.        STOP taking these medications   albuterol 108 (90 Base) MCG/ACT inhaler Commonly known as: VENTOLIN HFA Stopped by: Andrew Bang, MD   Andrew [name] [phone] MG Tabs Generic drug: Sodium Sulfate-Mag Sulfate-KCl Stopped by: Andrew Bang, MD     TAKE these medications   aspirin 81 MG tablet Take 81 mg by mouth daily.   atorvastatin 40 MG tablet Commonly known as: LIPITOR TAKE 1 TABLET BY MOUTH  DAILY   cholecalciferol 1000 units tablet Commonly known as: VITAMIN D Take 1,000 Units by mouth daily.   Cinnamon 500 MG capsule Take 500 mg by mouth daily as needed (blood glucose levels).   glucose blood test strip 1 each by Other route as needed for other. Use as instructed to test blood sugar daily.  One Touch Verio Test Strips DX:  E11.65   Glyxambi 10-5 MG Tabs Generic drug: Empagliflozin-linaGLIPtin Take 1 tablet by mouth daily.   lisinopril-hydrochlorothiazide 20-25 MG tablet Commonly known as: ZESTORETIC TAKE 1 TABLET BY MOUTH  DAILY   metFORMIN 1000 MG tablet Commonly known as: GLUCOPHAGE TAKE 1 TABLET BY MOUTH  TWICE DAILY WITH MEALS   nabumetone 500 MG tablet Commonly known as: RELAFEN Take 2 tablets (1,000 mg total) by mouth 2 (two) times daily. For muscle and joint pain   sildenafil 25 MG tablet Commonly known as: VIAGRA Take 25 mg by mouth daily as needed for erectile dysfunction.   tadalafil 20 MG tablet Commonly known as: CIALIS Take 20 mg by mouth daily as needed for erectile dysfunction.   tamsulosin 0.4 MG Caps capsule Commonly known as: FLOMAX TAKE 1 CAPSULE BY MOUTH  DAILY   traZODone 150 MG tablet Commonly known as: DESYREL Use from 1/3 to 1 tablet nightly as needed for sleep.       Allergies: No Known Allergies  Family History: Family History  Problem Relation Age of Onset  . Diabetes Mother   . Heart disease Mother   . Kidney disease Mother        DIALYSIS  . Emphysema Father   . Deep vein thrombosis Sister   . Kidney disease Sister   . Lung cancer  Sister   . Colon polyps Brother   . Hypertension Brother   . Gout Brother   . Colon cancer Neg Hx   . Esophageal cancer Neg Hx   . Rectal cancer Neg Hx   . Prostate cancer Neg Hx     Social History:  reports that he has been smoking cigarettes. He started smoking about 54 years ago. He has a 55.00 pack-year smoking history. He has never used smokeless tobacco. He reports that he does not drink alcohol and does not use drugs.  ROS: All other review of systems were reviewed and are negative except what is noted above in HPI  Physical Exam: BP 115/70   Pulse 73   Temp 98.2 F (36.8 C)   Ht 5' 8.5" (1.74 m)   Wt 231 lb 6.1 oz (105 kg)   BMI 34.67 kg/m   Constitutional:  Alert and oriented, No acute  distress. HEENT: [place] AT, moist mucus membranes.  Trachea midline, no masses. Cardiovascular: No clubbing, cyanosis, or edema. Respiratory: Normal respiratory effort, no increased work of breathing. GI: Abdomen is soft, nontender, nondistended, no abdominal masses GU: No CVA tenderness.  Lymph: No cervical or inguinal lymphadenopathy. Skin: No rashes, bruises or suspicious lesions. Neurologic: Grossly intact, no focal deficits, moving all 4 extremities. Psychiatric: Normal mood and affect.  Laboratory Data: Lab Results  Component Value Date   WBC 6.3 01/10/2020   HGB 11.5 (L) 01/10/2020   HCT 32.4 (L) 01/10/2020   MCV 87 01/10/2020   PLT 261 01/10/2020    Lab Results  Component Value Date   CREATININE 1.61 (H) 01/10/2020    Lab Results  Component Value Date   PSA 1.6 08/10/2019    No results found for: TESTOSTERONE  Lab Results  Component Value Date   HGBA1C 6.9 01/10/2020    Urinalysis    Component Value Date/Time   COLORURINE YELLOW 07/02/2017 0410   APPEARANCEUR Clear 01/10/2020 1106   LABSPEC 1.018 07/02/2017 0410   PHURINE 7.0 07/02/2017 0410   GLUCOSEU 2+ (A) 01/10/2020 1106   HGBUR NEGATIVE 07/02/2017 0410   BILIRUBINUR Negative 01/10/2020 1106   KETONESUR NEGATIVE 07/02/2017 0410   PROTEINUR Negative 01/10/2020 1106   PROTEINUR NEGATIVE 07/02/2017 0410   UROBILINOGEN negative 07/19/2014 1002   NITRITE Negative 01/10/2020 1106   NITRITE NEGATIVE 07/02/2017 0410   LEUKOCYTESUR Negative 01/10/2020 1106    Lab Results  Component Value Date   LABMICR See below: 01/10/2020   WBCUA None seen 01/10/2020   RBCUA None seen 08/11/2018   LABEPIT None seen 01/10/2020   MUCUS neg 06/21/2013   BACTERIA None seen 01/10/2020    Pertinent Imaging:  No results found for this or any previous visit.  No results found for this or any previous visit.  No results found for this or any previous visit.  No results found for this or any previous visit.  No  results found for this or any previous visit.  No results found for this or any previous visit.  No results found for this or any previous visit.  No results found for this or any previous visit.   Assessment & Plan:    1. Prostate cancer metastatic to intrapelvic lymph node (HCC) PSA today, RTC 6 months with PSA and lupron 45mg  - Urinalysis, Routine w reflex microscopic - Leuprolide Acetate (6 Month) (LUPRON) injection 45 mg  2. Nocturia -Switch to uroxatral 10mg  qhs  3. Benign prostatic hyperplasia with urinary obstruction -uroxatral 10mg   No follow-ups on file.  Andrew Bang, MD  [facility] [place]

## 2020-02-08 NOTE — Progress Notes (Signed)
Urological Symptom Review  Patient is experiencing the following symptoms: Leakage of urine Stream starts and stops Trouble starting stream Blood in urine Erection problems (male only)   Review of Systems  Gastrointestinal (upper)  : Indigestion/heartburn  Gastrointestinal (lower) : Negative for lower GI symptoms  Constitutional : Negative for symptoms  Skin: Itching  Eyes: Blurred vision  Ear/Nose/Throat : Negative for Ear/Nose/Throat symptoms  Hematologic/Lymphatic: Negative for Hematologic/Lymphatic symptoms  Cardiovascular : Negative for cardiovascular symptoms  Respiratory : Shortness of breath  Endocrine: Negative for endocrine symptoms  Musculoskeletal: Back pain Joint pain  Neurological: Negative for neurological symptoms  Psychologic: Negative for psychiatric symptoms  Lupron IM Injection   Due to Prostate Cancer patient is present today for a Lupron Injection.  Medication: Lupron 6 month Dose: 45 mg  Location: right upper outer buttocks Lot: [id number] Exp: [id number]  Patient tolerated well, no complications were noted  Performed by: Niyonna Betsill,LPN  Follow up: 6 months

## 2020-02-09 LAB — PSA: Prostate Specific Ag, Serum: 3.1 ng/mL (ref 0.0–4.0)

## 2020-02-15 ENCOUNTER — Other Ambulatory Visit: Payer: Self-pay | Admitting: Urology

## 2020-02-15 DIAGNOSIS — C61 Malignant neoplasm of prostate: Secondary | ICD-10-CM

## 2020-02-22 ENCOUNTER — Telehealth: Payer: Self-pay

## 2020-02-22 NOTE — Telephone Encounter (Signed)
Pt called. Notified of psa results. Pt scheduled for bone scan and CT

## 2020-02-22 NOTE — Telephone Encounter (Signed)
-----   Message from Cleon Gustin, MD sent at 02/15/2020  7:39 AM EDT ----- PSA continues to rise. He needs a CT and bone scan. I have placed those orders and a BMP ----- Message ----- From: Dorisann Frames, RN Sent: 02/09/2020   9:03 AM EDT To: Cleon Gustin, MD  Please review

## 2020-02-28 ENCOUNTER — Other Ambulatory Visit: Payer: Self-pay

## 2020-02-28 ENCOUNTER — Encounter (HOSPITAL_COMMUNITY)
Admission: RE | Admit: 2020-02-28 | Discharge: 2020-02-28 | Disposition: A | Discharge status: Home / Self Care | Payer: Medicare Other | Source: Ambulatory Visit | Attending: Urology | Admitting: Urology

## 2020-02-28 ENCOUNTER — Encounter (HOSPITAL_COMMUNITY): Payer: Self-pay

## 2020-02-28 DIAGNOSIS — M419 Scoliosis, unspecified: Secondary | ICD-10-CM | POA: Diagnosis not present

## 2020-02-28 DIAGNOSIS — C61 Malignant neoplasm of prostate: Secondary | ICD-10-CM | POA: Insufficient documentation

## 2020-02-28 DIAGNOSIS — M19011 Primary osteoarthritis, right shoulder: Secondary | ICD-10-CM | POA: Diagnosis not present

## 2020-02-28 DIAGNOSIS — M19012 Primary osteoarthritis, left shoulder: Secondary | ICD-10-CM | POA: Diagnosis not present

## 2020-02-28 MED ORDER — TECHNETIUM TC 99M MEDRONATE IV KIT
20.0000 | PACK | Freq: Once | INTRAVENOUS | Status: AC | PRN
  Administered 2020-02-28: 19.5 via INTRAVENOUS

## 2020-03-02 NOTE — Progress Notes (Signed)
Results mailed 

## 2020-03-09 ENCOUNTER — Encounter (HOSPITAL_COMMUNITY): Payer: Self-pay

## 2020-03-09 ENCOUNTER — Other Ambulatory Visit: Payer: Self-pay

## 2020-03-09 ENCOUNTER — Ambulatory Visit (HOSPITAL_COMMUNITY)
Admission: RE | Admit: 2020-03-09 | Discharge: 2020-03-09 | Disposition: A | Discharge status: Home / Self Care | Payer: Medicare Other | Source: Ambulatory Visit | Attending: Urology | Admitting: Urology

## 2020-03-09 DIAGNOSIS — K5730 Diverticulosis of large intestine without perforation or abscess without bleeding: Secondary | ICD-10-CM | POA: Diagnosis not present

## 2020-03-09 DIAGNOSIS — N281 Cyst of kidney, acquired: Secondary | ICD-10-CM | POA: Diagnosis not present

## 2020-03-09 DIAGNOSIS — C61 Malignant neoplasm of prostate: Secondary | ICD-10-CM | POA: Diagnosis not present

## 2020-03-09 DIAGNOSIS — I700 Atherosclerosis of aorta: Secondary | ICD-10-CM | POA: Diagnosis not present

## 2020-03-09 DIAGNOSIS — N3289 Other specified disorders of bladder: Secondary | ICD-10-CM | POA: Diagnosis not present

## 2020-03-09 LAB — POCT I-STAT CREATININE: Creatinine, Ser: 1.6 mg/dL — ABNORMAL HIGH (ref 0.61–1.24)

## 2020-03-09 MED ORDER — IOHEXOL 300 MG/ML  SOLN
100.0000 mL | Freq: Once | INTRAMUSCULAR | Status: AC | PRN
  Administered 2020-03-09: 100 mL via INTRAVENOUS

## 2020-03-15 ENCOUNTER — Telehealth: Payer: Self-pay

## 2020-03-15 NOTE — Chronic Care Management (AMB) (Signed)
  Care Management   Note  03/15/2020 Name: Andrew [name] MRN: [id number] DOB: Sep 21, 1945  Andrew [name] is a 74 y.o. year old male who is a primary care patient of Stacks, Cletus Gash, MD and is actively engaged with the care management team. I reached out to [name] by phone today to assist with re-scheduling a follow up visit with the RN Case Manager  Follow up plan: Unsuccessful telephone outreach attempt made. A HIPAA compliant phone message was left for the patient providing contact information and requesting a return call.  The care management team will reach out to the patient again over the next 1 days.  If patient returns call to provider office, please advise to call [facility]  at [place], [place], [place], [place] [address] Direct Dial: [phone] [email or web address] Website: [email or web address]

## 2020-03-16 ENCOUNTER — Telehealth: Payer: Medicare Other

## 2020-03-19 ENCOUNTER — Other Ambulatory Visit: Payer: Self-pay

## 2020-03-19 DIAGNOSIS — N138 Other obstructive and reflux uropathy: Secondary | ICD-10-CM

## 2020-03-19 MED ORDER — ALFUZOSIN HCL ER 10 MG PO TB24: 10.0000 mg | ORAL_TABLET | Freq: Every day | ORAL | 11 refills | Status: DC

## 2020-03-23 NOTE — Chronic Care Management (AMB) (Signed)
  Care Management   Note  03/23/2020 Name: [name] MRN: [id number] DOB: 1946/01/15  [name] is a 74 y.o. year old male who is a primary care patient of Stacks, Cletus Gash, MD and is actively engaged with the care management team. I reached out to [name] by phone today to assist with re-scheduling a follow up visit with the RN Case Manager  Follow up plan: Unsuccessful telephone outreach attempt made. A HIPAA compliant phone message was left for the patient providing contact information and requesting a return call.  The care management team will reach out to the patient again over the next 7 days.  If patient returns call to provider office, please advise to call [facility]  at [place], [place], [place],  [address] Direct Dial: [phone] [email or web address]: [email or web address]

## 2020-03-26 NOTE — Chronic Care Management (AMB) (Signed)
  Care Management   Note  03/26/2020 Name: Andrew [name] MRN: [id number] DOB: February 25, 1946  Andrew [name] is a 74 y.o. year old male who is a primary care patient of Stacks, Cletus Gash, MD and is actively engaged with the care management team. I reached out to [name] by phone today to assist with re-scheduling a follow up visit with the RN Case Manager  Follow up plan: Telephone appointment with care management team member scheduled for:04/17/2020   Noreene Larsson, [place], [place]  [place], [place] [address] Direct Dial: [phone] [email or web address] Website: [email or web address]

## 2020-04-04 ENCOUNTER — Telehealth: Payer: Self-pay

## 2020-04-04 NOTE — Telephone Encounter (Signed)
-----   Message from Cleon Gustin, MD sent at 04/03/2020  9:25 AM EDT ----- Patient needs to see me in the next couple of weeks ----- Message ----- From: Dorisann Frames, RN Sent: 03/12/2020   8:53 AM EDT To: Cleon Gustin, MD  Please review

## 2020-04-04 NOTE — Telephone Encounter (Signed)
appt scheduled with wife.

## 2020-04-10 ENCOUNTER — Ambulatory Visit (INDEPENDENT_AMBULATORY_CARE_PROVIDER_SITE_OTHER): Payer: Medicare Other | Admitting: Family Medicine

## 2020-04-10 ENCOUNTER — Encounter: Payer: Self-pay | Admitting: Family Medicine

## 2020-04-10 ENCOUNTER — Other Ambulatory Visit: Payer: Self-pay

## 2020-04-10 VITALS — BP 123/70 | HR 64 | Temp 96.8°F | Ht 68.5 in | Wt 213.0 lb

## 2020-04-10 DIAGNOSIS — E785 Hyperlipidemia, unspecified: Secondary | ICD-10-CM

## 2020-04-10 DIAGNOSIS — R5383 Other fatigue: Secondary | ICD-10-CM

## 2020-04-10 DIAGNOSIS — I10 Essential (primary) hypertension: Secondary | ICD-10-CM

## 2020-04-10 DIAGNOSIS — R6889 Other general symptoms and signs: Secondary | ICD-10-CM | POA: Diagnosis not present

## 2020-04-10 DIAGNOSIS — E1169 Type 2 diabetes mellitus with other specified complication: Secondary | ICD-10-CM

## 2020-04-10 DIAGNOSIS — Z23 Encounter for immunization: Secondary | ICD-10-CM | POA: Diagnosis not present

## 2020-04-10 DIAGNOSIS — E782 Mixed hyperlipidemia: Secondary | ICD-10-CM

## 2020-04-10 DIAGNOSIS — E1165 Type 2 diabetes mellitus with hyperglycemia: Secondary | ICD-10-CM

## 2020-04-10 DIAGNOSIS — E559 Vitamin D deficiency, unspecified: Secondary | ICD-10-CM | POA: Diagnosis not present

## 2020-04-10 LAB — CBC WITH DIFFERENTIAL/PLATELET

## 2020-04-10 LAB — BAYER DCA HB A1C WAIVED: HB A1C (BAYER DCA - WAIVED): 6.1 % (ref <7.0)

## 2020-04-10 NOTE — Progress Notes (Signed)
Subjective:  Patient ID: Andrew [name],  male    DOB: 05/16/1946  Age: 74 y.o.    CC: Follow-up   HPI Andrew [name] presents for  follow-up of hypertension. Patient has no history of headache chest pain or shortness of breath or recent cough. Patient also denies symptoms of TIA such as numbness weakness lateralizing. Patient denies side effects from medication. States taking it regularly.  Patient also  in for follow-up of elevated cholesterol. Doing well without complaints on current medication. Denies side effects  including myalgia and arthralgia and nausea. Also in today for liver function testing. Currently no chest pain, shortness of breath or other cardiovascular related symptoms noted.  Follow-up of diabetes. Patient does check blood sugar at home. Readings run slightly below up to slightly above 100 fasting.  Patient denies symptoms such as excessive hunger or urinary frequency, excessive hunger, nausea No significant hypoglycemic spells noted. Medications reviewed. Pt reports taking them regularly. Pt. denies complication/adverse reaction today.   Patient's main concern today is that he feels sluggish and lazy because he has no energy.  He has not been exercising regularly.  He says is been going on about 6 to 8 months.   History Andrew [name] has a past medical history of Anemia, BPH (benign prostatic hyperplasia), Cataract, Diabetes mellitus without complication (Andrew [name]), Hyperlipidemia, Hypertension, Low serum vitamin D, and Prostate cancer (Andrew [name]) (2008).   He has a past surgical history that includes Prostate surgery (2008) and Colonoscopy (1017,5102).   His family history includes Colon polyps in his brother; Deep vein thrombosis in his sister; Diabetes in his mother; Emphysema in his father; Gout in his brother; Heart disease in his mother; Hypertension in his brother; Kidney disease in his mother and sister; Lung cancer in his sister.He reports that he has been smoking cigarettes. He  started smoking about 54 years ago. He has a 55.00 pack-year smoking history. He has never used smokeless tobacco. He reports that he does not drink alcohol and does not use drugs.  Current Outpatient Medications on File Prior to Visit  Medication Sig Dispense Refill  . alfuzosin (UROXATRAL) 10 MG 24 hr tablet Take 1 tablet (10 mg total) by mouth at bedtime. 30 tablet 11  . aspirin 81 MG tablet Take 81 mg by mouth daily.    Marland Kitchen atorvastatin (LIPITOR) 40 MG tablet TAKE 1 TABLET BY MOUTH  DAILY 90 tablet 1  . cholecalciferol (VITAMIN D) 1000 UNITS tablet Take 1,000 Units by mouth daily.     . Cinnamon 500 MG capsule Take 500 mg by mouth daily as needed (blood glucose levels).    . clotrimazole-betamethasone (LOTRISONE) cream Apply 1 application topically 2 (two) times daily. 30 g 0  . Empagliflozin-linaGLIPtin (GLYXAMBI) 10-5 MG TABS Take 1 tablet by mouth daily.    Marland Kitchen glucose blood test strip 1 each by Other route as needed for other. Use as instructed to test blood sugar daily.  One Touch Verio Test Strips DX: E11.65 100 each 11  . lisinopril-hydrochlorothiazide (ZESTORETIC) 20-25 MG tablet TAKE 1 TABLET BY MOUTH  DAILY 90 tablet 1  . metFORMIN (GLUCOPHAGE) 1000 MG tablet TAKE 1 TABLET BY MOUTH  TWICE DAILY WITH MEALS 180 tablet 0  . nabumetone (RELAFEN) 500 MG tablet Take 2 tablets (1,000 mg total) by mouth 2 (two) times daily. For muscle and joint pain 360 tablet 1  . sildenafil (VIAGRA) 25 MG tablet Take 25 mg by mouth daily as needed for erectile dysfunction.     Marland Kitchen  tadalafil (CIALIS) 20 MG tablet Take 20 mg by mouth daily as needed for erectile dysfunction.     . tamsulosin (FLOMAX) 0.4 MG CAPS capsule TAKE 1 CAPSULE BY MOUTH  DAILY 90 capsule 1  . traZODone (DESYREL) 150 MG tablet Use from 1/3 to 1 tablet nightly as needed for sleep. 30 tablet 5   No current facility-administered medications on file prior to visit.    ROS Review of Systems  Constitutional: Positive for fatigue.  HENT:  Negative.   Eyes: Negative for visual disturbance.  Respiratory: Negative for cough and shortness of breath.   Cardiovascular: Negative for chest pain and leg swelling.  Gastrointestinal: Negative for abdominal pain, diarrhea, nausea and vomiting.  Genitourinary: Negative for difficulty urinating.  Musculoskeletal: Negative for arthralgias and myalgias.  Skin: Negative for rash.  Neurological: Negative for headaches.  Psychiatric/Behavioral: Negative for sleep disturbance.    Objective:  BP 123/70   Pulse 64   Temp (!) 96.8 F (36 C) (Temporal)   Ht 5' 8.5" (1.74 m)   Wt 213 lb (96.6 kg)   BMI 31.92 kg/m   BP Readings from Last 3 Encounters:  04/10/20 123/70  02/08/20 115/70  01/10/20 113/67    Wt Readings from Last 3 Encounters:  04/10/20 213 lb (96.6 kg)  02/08/20 231 lb 6.1 oz (105 kg)  01/10/20 213 lb 6 oz (96.8 kg)     Physical Exam Vitals reviewed.  Constitutional:      Appearance: He is well-developed.  HENT:     Head: Normocephalic and atraumatic.     Right Ear: Tympanic membrane and external ear normal. No decreased hearing noted.     Left Ear: Tympanic membrane and external ear normal. No decreased hearing noted.     Mouth/Throat:     Pharynx: No oropharyngeal exudate or posterior oropharyngeal erythema.  Eyes:     Pupils: Pupils are equal, round, and reactive to light.  Cardiovascular:     Rate and Rhythm: Normal rate and regular rhythm.     Heart sounds: No murmur heard.   Pulmonary:     Effort: No respiratory distress.     Breath sounds: Normal breath sounds.  Abdominal:     General: Bowel sounds are normal.     Palpations: Abdomen is soft. There is no mass.     Tenderness: There is no abdominal tenderness.  Musculoskeletal:     Cervical back: Normal range of motion and neck supple.     Diabetic Foot Exam - Simple   No data filed        Assessment & Plan:   [name] was seen today for follow-up.  Diagnoses and all orders for this  visit:  Uncontrolled type 2 diabetes mellitus with hyperglycemia ([place]) -     CBC with Differential/Platelet -     CMP14+EGFR -     Bayer DCA Hb A1c Waived  Essential hypertension -     CBC with Differential/Platelet -     CMP14+EGFR  Hyperlipidemia associated with type 2 diabetes mellitus (HCC) -     CBC with Differential/Platelet -     CMP14+EGFR -     Lipid panel  Mixed hyperlipidemia -     CBC with Differential/Platelet -     CMP14+EGFR -     Lipid panel  Vitamin D deficiency -     CBC with Differential/Platelet -     CMP14+EGFR -     VITAMIN D 25 Hydroxy (Vit-D Deficiency, Fractures)  Need for immunization  against influenza -     Flu Vaccine QUAD High Dose(Fluad)  Fatigue, unspecified type -     TSH -     Testosterone,Free and Total -     Vitamin B12   I am having [name] maintain his aspirin, cholecalciferol, Cinnamon, traZODone, glucose blood, tadalafil, sildenafil, lisinopril-hydrochlorothiazide, atorvastatin, Glyxambi, nabumetone, metFORMIN, tamsulosin, clotrimazole-betamethasone, and alfuzosin.  No orders of the defined types were placed in this encounter.    Follow-up: Return in about 3 months (around 07/11/2020).  [name], M.D.

## 2020-04-11 LAB — CBC WITH DIFFERENTIAL/PLATELET
Basophils Absolute: 0.1 10*3/uL (ref 0.0–0.2)
Basos: 1 %
EOS (ABSOLUTE): 0.9 10*3/uL — ABNORMAL HIGH (ref 0.0–0.4)
Eos: 11 %
Hematocrit: 34.3 % — ABNORMAL LOW (ref 37.5–51.0)
Hemoglobin: 11.6 g/dL — ABNORMAL LOW (ref 13.0–17.7)
Immature Grans (Abs): 0 10*3/uL (ref 0.0–0.1)
Immature Granulocytes: 0 %
Lymphocytes Absolute: 2 10*3/uL (ref 0.7–3.1)
Lymphs: 26 %
MCH: 30.4 pg (ref 26.6–33.0)
MCHC: 33.8 g/dL (ref 31.5–35.7)
MCV: 90 fL (ref 79–97)
Monocytes Absolute: 0.5 10*3/uL (ref 0.1–0.9)
Monocytes: 7 %
Neutrophils Absolute: 4.1 10*3/uL (ref 1.4–7.0)
Neutrophils: 55 %
Platelets: 220 10*3/uL (ref 150–450)
RBC: 3.82 x10E6/uL — ABNORMAL LOW (ref 4.14–5.80)
RDW: 12.9 % (ref 11.6–15.4)
WBC: 7.5 10*3/uL (ref 3.4–10.8)

## 2020-04-11 LAB — CMP14+EGFR
ALT: 22 IU/L (ref 0–44)
AST: 26 IU/L (ref 0–40)
Albumin/Globulin Ratio: 1.3 (ref 1.2–2.2)
Albumin: 3.7 g/dL (ref 3.7–4.7)
Alkaline Phosphatase: 96 IU/L (ref 44–121)
BUN/Creatinine Ratio: 15 (ref 10–24)
BUN: 21 mg/dL (ref 8–27)
Bilirubin Total: 0.4 mg/dL (ref 0.0–1.2)
CO2: 24 mmol/L (ref 20–29)
Calcium: 9 mg/dL (ref 8.6–10.2)
Chloride: 104 mmol/L (ref 96–106)
Creatinine, Ser: 1.43 mg/dL — ABNORMAL HIGH (ref 0.76–1.27)
GFR calc Af Amer: 55 mL/min/{1.73_m2} — ABNORMAL LOW (ref >59)
GFR calc non Af Amer: 48 mL/min/{1.73_m2} — ABNORMAL LOW (ref >59)
Globulin, Total: 2.8 g/dL (ref 1.5–4.5)
Glucose: 91 mg/dL (ref 65–99)
Potassium: 4.2 mmol/L (ref 3.5–5.2)
Sodium: 141 mmol/L (ref 134–144)
Total Protein: 6.5 g/dL (ref 6.0–8.5)

## 2020-04-11 LAB — LIPID PANEL
Chol/HDL Ratio: 4.6 ratio (ref 0.0–5.0)
Cholesterol, Total: 134 mg/dL (ref 100–199)
HDL: 29 mg/dL — ABNORMAL LOW (ref >39)
LDL Chol Calc (NIH): 76 mg/dL (ref 0–99)
Triglycerides: 168 mg/dL — ABNORMAL HIGH (ref 0–149)
VLDL Cholesterol Cal: 29 mg/dL (ref 5–40)

## 2020-04-11 LAB — VITAMIN D 25 HYDROXY (VIT D DEFICIENCY, FRACTURES): Vit D, 25-Hydroxy: 31.1 ng/mL (ref 30.0–100.0)

## 2020-04-15 LAB — TESTOSTERONE,FREE AND TOTAL
Testosterone, Free: 0.7 pg/mL — ABNORMAL LOW (ref 6.6–18.1)
Testosterone: <3 ng/dL — ABNORMAL LOW (ref 264–916)

## 2020-04-15 LAB — TSH: TSH: 1.54 u[IU]/mL (ref 0.450–4.500)

## 2020-04-15 LAB — VITAMIN B12: Vitamin B-12: 383 pg/mL (ref 232–1245)

## 2020-04-15 LAB — SPECIMEN STATUS REPORT

## 2020-04-17 ENCOUNTER — Ambulatory Visit (INDEPENDENT_AMBULATORY_CARE_PROVIDER_SITE_OTHER): Payer: Medicare Other | Admitting: Urology

## 2020-04-17 ENCOUNTER — Encounter: Payer: Self-pay | Admitting: Urology

## 2020-04-17 ENCOUNTER — Other Ambulatory Visit: Payer: Self-pay

## 2020-04-17 ENCOUNTER — Ambulatory Visit (INDEPENDENT_AMBULATORY_CARE_PROVIDER_SITE_OTHER): Payer: Medicare Other | Admitting: *Deleted

## 2020-04-17 VITALS — BP 112/65 | HR 79 | Temp 98.6°F | Ht 68.5 in | Wt 213.0 lb

## 2020-04-17 DIAGNOSIS — I10 Essential (primary) hypertension: Secondary | ICD-10-CM | POA: Diagnosis not present

## 2020-04-17 DIAGNOSIS — N138 Other obstructive and reflux uropathy: Secondary | ICD-10-CM

## 2020-04-17 DIAGNOSIS — E1165 Type 2 diabetes mellitus with hyperglycemia: Secondary | ICD-10-CM | POA: Diagnosis not present

## 2020-04-17 DIAGNOSIS — R351 Nocturia: Secondary | ICD-10-CM

## 2020-04-17 DIAGNOSIS — N401 Enlarged prostate with lower urinary tract symptoms: Secondary | ICD-10-CM

## 2020-04-17 DIAGNOSIS — C775 Secondary and unspecified malignant neoplasm of intrapelvic lymph nodes: Secondary | ICD-10-CM | POA: Diagnosis not present

## 2020-04-17 DIAGNOSIS — C61 Malignant neoplasm of prostate: Secondary | ICD-10-CM

## 2020-04-17 NOTE — Patient Instructions (Signed)
Prostate Cancer  The prostate is a walnut-sized gland that is involved in the production of semen. It is located below a man's bladder, in front of the rectum. Prostate cancer is the abnormal growth of cells in the prostate gland. What are the causes? The exact cause of this condition is not known. What increases the risk? This condition is more likely to develop in men who:  Are older than age 74.  Are African-American.  Are obese.  Have a family history of prostate cancer.  Have a family history of breast cancer. What are the signs or symptoms? Symptoms of this condition include:  A need to urinate often.  Weak or interrupted flow of urine.  Trouble starting or stopping urination.  Inability to urinate.  Pain or burning during urination.  Painful ejaculation.  Blood in urine or semen.  Persistent pain or discomfort in the lower back, lower abdomen, hips, or upper thighs.  Trouble getting an erection.  Trouble emptying the bladder all the way. How is this diagnosed? This condition can be diagnosed with:  A digital rectal exam. For this exam, a health care provider inserts a gloved finger into the rectum to feel the prostate gland.  A blood test called a prostate-specific antigen (PSA) test.  An imaging test called transrectal ultrasonography.  A procedure in which a sample of tissue is taken from the prostate and examined under a microscope (prostate biopsy). Once the condition is diagnosed, tests will be done to determine how far the cancer has spread. This is called staging the cancer. Staging may involve imaging tests, such as:  A bone scan.  A CT scan.  A PET scan.  An MRI. The stages of prostate cancer are as follows:  Stage I. At this stage, the cancer is found in the prostate only. The cancer is not visible on imaging tests and it is usually found by accident, such as during a prostate surgery.  Stage II. At this stage, the cancer is more advanced  than it is in stage I, but the cancer has not spread outside the prostate.  Stage III. At this stage, the cancer has spread beyond the outer layer of the prostate to nearby tissues. The cancer may be found in the seminal vesicles, which are near the bladder and the prostate.  Stage IV. At this stage, the cancer has spread other parts of the body, such as the lymph nodes, bones, bladder, rectum, liver, or lungs. How is this treated? Treatment for this condition depends on several factors, including the stage of the cancer, your age, personal preferences, and your overall health. Talk with your health care provider about treatment options that are recommended for you. Common treatments include:  Observation for early stage prostate cancer (active surveillance). This involves having exams, blood tests, and in some cases, more biopsies. For some men, this is the only treatment needed.  Surgery. Types of surgeries include: ? Open surgery. In this surgery, a larger incision is made to remove the prostate. ? A laparoscopic prostatectomy. This is a surgery to remove the prostate and lymph nodes through several, small incisions. It is often referred to as a minimally invasive surgery. ? A robotic prostatectomy. This is a surgery to remove the prostate and lymph nodes with the help of a robotic arm that is controlled by a computer. ? Orchiectomy. This is a surgery to remove the testicles. ? Cryosurgery. This is a surgery to freeze and destroy cancer cells.  Radiation treatment. Types   of radiation treatment include: ? External beam radiation. This type aims beams of radiation from outside the body at the prostate to destroy cancerous cells. ? Brachytherapy. This type uses radioactive needles, seeds, wires, or tubes that are implanted into the prostate gland. Like external beam radiation, brachytherapy destroys cancerous cells. An advantage is that this type of radiation limits the damage to surrounding  tissue and has fewer side effects.  High-intensity, focused ultrasonography. This treatment destroys cancer cells by delivering high-energy ultrasound waves to the cancerous cells.  Chemotherapy medicines. This treatment kills cancer cells or stops them from multiplying.  Hormone treatment. This treatment involves taking medicines that act on one of the male hormones (testosterone): ? By stopping your body from producing testosterone. ? By blocking testosterone from reaching cancer cells. Follow these instructions at home:  Take over-the-counter and prescription medicines only as told by your health care provider.  Maintain a healthy diet.  Get plenty of sleep.  Consider joining a support group for men who have prostate cancer. Meeting with a support group may help you learn to cope with the stress of having cancer.  Keep all follow-up visits as told by your health care provider. This is important.  If you have to go to the hospital, notify your cancer specialist (oncologist).  Treatment for prostate cancer may affect sexual function. Continue to have intimate moments with your partner. This may include touching, holding, hugging, and caressing. Contact a health care provider if:  You have trouble urinating.  You have blood in your urine.  You have pain in your hips, back, or chest. Get help right away if:  You have weakness or numbness in your legs.  You cannot control urination or your bowel movements (incontinence).  You have trouble breathing.  You have sudden chest pain.  You have chills or a fever. Summary  The prostate is a walnut-sized gland that is involved in the production of semen. It is located below a man's bladder, in front of the rectum. Prostate cancer is the abnormal growth of cells in the prostate gland.  Treatment for this condition depends on several factors, including the stage of the cancer, your age, personal preferences, and your overall health.  Talk with your health care provider about treatment options that are recommended for you.  Consider joining a support group for men who have prostate cancer. Meeting with a support group may help you learn to cope with the stress of having cancer. This information is not intended to replace advice given to you by your health care provider. Make sure you discuss any questions you have with your health care provider. Document Revised: 05/29/2017 Document Reviewed: 02/25/2016 Elsevier Patient Education  2020 Elsevier Inc.  

## 2020-04-17 NOTE — Chronic Care Management (AMB) (Signed)
Chronic Care Management   Follow Up Note   04/17/2020 Name: Andrew [name] MRN: [id number] DOB: 06/16/1946  Referred by: [name], MD Reason for referral : Chronic Care Management (RN follow up)   Andrew [name] is a 74 y.o. year old male who is a primary care patient of Stacks, Cletus Gash, MD. The CCM team was consulted for assistance with chronic disease management and care coordination needs.    Review of patient status, including review of consultants reports, relevant laboratory and other test results, and collaboration with appropriate care team members and the patient's provider was performed as part of comprehensive patient evaluation and provision of chronic care management services.    SDOH (Social Determinants of Health) assessments performed: No See Care Plan activities for detailed interventions related to [facility])     Outpatient Encounter Medications as of 04/17/2020  Medication Sig Note  . alfuzosin (UROXATRAL) 10 MG 24 hr tablet Take 1 tablet (10 mg total) by mouth at bedtime.   Marland Kitchen aspirin 81 MG tablet Take 81 mg by mouth daily.   Marland Kitchen atorvastatin (LIPITOR) 40 MG tablet TAKE 1 TABLET BY MOUTH  DAILY   . cholecalciferol (VITAMIN D) 1000 UNITS tablet Take 1,000 Units by mouth daily.    . Cinnamon 500 MG capsule Take 500 mg by mouth daily as needed (blood glucose levels).   . clotrimazole-betamethasone (LOTRISONE) cream Apply 1 application topically 2 (two) times daily.   . Empagliflozin-linaGLIPtin (GLYXAMBI) 10-5 MG TABS Take 1 tablet by mouth daily. 11/07/2019: #21 tabs ENI#[id number] A EXP 3/22  . glucose blood test strip 1 each by Other route as needed for other. Use as instructed to test blood sugar daily.  One Touch Verio Test Strips DX: E11.65 10/17/2019: One Touch Verio  . lisinopril-hydrochlorothiazide (ZESTORETIC) 20-25 MG tablet TAKE 1 TABLET BY MOUTH  DAILY   . metFORMIN (GLUCOPHAGE) 1000 MG tablet TAKE 1 TABLET BY MOUTH  TWICE DAILY WITH MEALS   . nabumetone (RELAFEN) 500  MG tablet Take 2 tablets (1,000 mg total) by mouth 2 (two) times daily. For muscle and joint pain   . sildenafil (VIAGRA) 25 MG tablet Take 25 mg by mouth daily as needed for erectile dysfunction.    . tadalafil (CIALIS) 20 MG tablet Take 20 mg by mouth daily as needed for erectile dysfunction.    . tamsulosin (FLOMAX) 0.4 MG CAPS capsule TAKE 1 CAPSULE BY MOUTH  DAILY   . traZODone (DESYREL) 150 MG tablet Use from 1/3 to 1 tablet nightly as needed for sleep.    No facility-administered encounter medications on file as of 04/17/2020.    Lab Results  Component Value Date   HGBA1C 6.1 04/10/2020   HGBA1C 6.9 01/10/2020   HGBA1C 7.1 (H) 10/11/2019   Lab Results  Component Value Date   MICROALBUR 100 11/21/2014   LDLCALC 76 04/10/2020   CREATININE 1.43 (H) 04/10/2020     Goals Addressed      Patient Stated   .  "I want to keep my blood sugar under control" (pt-stated)        CARE PLAN ENTRY (see longitudinal plan of care for additional care plan information)  Current Barriers:  . 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   Nurse Case Manager Clinical Goal(s):  Marland Kitchen Over the next 60 days, patient will work with Consulting civil engineer to address needs related to diabetes . Over the next 90 days, will demonstrate compliance by checking and recording  blood sugar daily and by calling PCP with any readings outside of recommended range . Over the next 90 days, patient will keep all scheduled medical appointments  Interventions:  . Inter-disciplinary care team collaboration (see longitudinal plan of care) . Evaluation of current treatment plan related to diabetes and patient's adherence to plan as established by provider. . Chart reviewed including recent office notes and lab results . Discussed A1C improvement . Reviewed medications o Pt requesting refill on test strips. Advised to contact [facility] for refill because he has a current  script on file with them.  . Discussed approval for prescription assistance for Glyxambi  . Discussed home CBG results o 129 fasting this morning . Discussed diet . Encouraged patient to follow-up with PharmD as needed . Reviewed upcoming appointments . Advised patient, providing education and rationale, to check cbg daily and record, calling [phone] for findings outside established parameters.    Patient Self Care Activities:  . Performs ADL's independently . Performs IADL's independently  Please see past updates related to this goal by clicking on the "Past Updates" button in the selected goal          Plan:   The care management team will reach out to the patient again over the next 60 days.    Chong Sicilian, BSN, RN-BC Embedded Chronic Care Manager [facility] Direct Dial: [phone]

## 2020-04-17 NOTE — Patient Instructions (Signed)
Visit Information  Goals Addressed              This Visit's Progress     Patient Stated   .  COMPLETED: "I need help getting my medicine" (pt-stated)        CARE PLAN ENTRY (see longitudinal plan of care for additional care plan information)  Current Barriers:  . Care Coordination needs related to medication samples in a patient with diabetes (disease states)  Nurse Case Manager Clinical Goal(s):  Marland Kitchen Over the next 14 days, patient will work with Clinical Pharmacist regarding prescription assistance for Glyxambi  Interventions:  . Inter-disciplinary care team collaboration (see longitudinal plan of care) . Chart reviewed including recent office notes and lab results . Verified that patient was approved for prescription assistance program . Encouraged patient to keep follow-up appointments . Encouraged patient to reach out to PharmD with any medication concerns or questions . Encouraged patient to reach out to [facility] with any nursing care needs  Patient Self Care Activities:  . Performs ADL's independently . Performs IADL's independently  Please see past updates related to this goal by clicking on the "Past Updates" button in the selected goal      .  "I want to keep my blood sugar under control" (pt-stated)        CARE PLAN ENTRY (see longitudinal plan of care for additional care plan information)  Current Barriers:  . 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   Nurse Case Manager Clinical Goal(s):  Marland Kitchen Over the next 60 days, patient will work with Consulting civil engineer to address needs related to diabetes . Over the next 90 days, will demonstrate compliance by checking and recording blood sugar daily and by calling PCP with any readings outside of recommended range . Over the next 90 days, patient will keep all scheduled medical appointments  Interventions:  . Inter-disciplinary care team collaboration (see longitudinal  plan of care) . Evaluation of current treatment plan related to diabetes and patient's adherence to plan as established by provider. . Chart reviewed including recent office notes and lab results . Discussed A1C improvement . Reviewed medications o Pt requesting refill on test strips. Advised to contact [facility] for refill because he has a current script on file with them.  . Discussed approval for prescription assistance for Glyxambi  . Discussed home CBG results o 129 fasting this morning . Discussed diet . Encouraged patient to follow-up with PharmD as needed . Reviewed upcoming appointments . Advised patient, providing education and rationale, to check cbg daily and record, calling [phone] for findings outside established parameters.    Patient Self Care Activities:  . Performs ADL's independently . Performs IADL's independently  Please see past updates related to this goal by clicking on the "Past Updates" button in the selected goal         Patient verbalizes understanding of instructions provided today.   Follow-up Plan The care management team will reach out to the patient again over the next 60 days.   Chong Sicilian, BSN, RN-BC Embedded Chronic Care Manager [facility] [place] [facility] Direct Dial: [phone]

## 2020-04-17 NOTE — Progress Notes (Signed)

## 2020-04-17 NOTE — Progress Notes (Signed)
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 CT scan shows enlarging pelvic lymph node to 4cm. Bopne scna negative for metastatic disease. He has stable nocturia 1-2x on uroxatral. Stream strong. No dysuria   PMH: Past Medical History:  Diagnosis Date  . Anemia   . BPH (benign prostatic hyperplasia)    Sees Dr [name]  . Cataract   . Diabetes mellitus without complication ([place])   . Hyperlipidemia   . Hypertension   . Low serum vitamin D   . Prostate cancer ([place]) 2008   w/seed implantation and radiation    Surgical History: Past Surgical History:  Procedure Laterality Date  . COLONOSCOPY  [id number]  . PROSTATE SURGERY  2008   seed implant    Home Medications:  Allergies as of 04/17/2020   No Known Allergies     Medication List       Accurate as of April 17, 2020  4:36 PM. If you have any questions, ask your nurse or doctor.        alfuzosin 10 MG 24 hr tablet Commonly known as: UROXATRAL Take 1 tablet (10 mg total) by mouth at bedtime.   aspirin 81 MG tablet Take 81 mg by mouth daily.   atorvastatin 40 MG tablet Commonly known as: LIPITOR TAKE 1 TABLET BY MOUTH  DAILY   cholecalciferol 1000 units tablet Commonly known as: VITAMIN D Take 1,000 Units by mouth daily.   Cinnamon 500 MG capsule Take 500 mg by mouth daily as needed (blood glucose levels).   clotrimazole-betamethasone cream Commonly known as: Lotrisone Apply 1 application topically 2 (two) times daily.   glucose blood test strip 1 each by Other route as needed for other. Use as instructed to test blood sugar daily.  One Touch Verio Test Strips DX: E11.65   Glyxambi 10-5 MG Tabs Generic drug: Empagliflozin-linaGLIPtin Take 1 tablet  by mouth daily.   lisinopril-hydrochlorothiazide 20-25 MG tablet Commonly known as: ZESTORETIC TAKE 1 TABLET BY MOUTH  DAILY   metFORMIN 1000 MG tablet Commonly known as: GLUCOPHAGE TAKE 1 TABLET BY MOUTH  TWICE DAILY WITH MEALS   nabumetone 500 MG tablet Commonly known as: RELAFEN Take 2 tablets (1,000 mg total) by mouth 2 (two) times daily. For muscle and joint pain   sildenafil 25 MG tablet Commonly known as: VIAGRA Take 25 mg by mouth daily as needed for erectile dysfunction.   tadalafil 20 MG tablet Commonly known as: CIALIS Take 20 mg by mouth daily as needed for erectile dysfunction.   tamsulosin 0.4 MG Caps capsule Commonly known as: FLOMAX TAKE 1 CAPSULE BY MOUTH  DAILY   traZODone 150 MG tablet Commonly known as: DESYREL Use from 1/3 to 1 tablet nightly as needed for sleep.       Allergies: No Known Allergies  Family History: Family History  Problem Relation Age of Onset  . Diabetes Mother   . Heart disease Mother   . Kidney disease Mother        DIALYSIS  . Emphysema Father   . Deep vein thrombosis Sister   . Kidney disease Sister   . Lung cancer Sister   . Colon polyps Brother   . Hypertension Brother   . Gout Brother   .  Colon cancer Neg Hx   . Esophageal cancer Neg Hx   . Rectal cancer Neg Hx   . Prostate cancer Neg Hx     Social History:  reports that he has been smoking cigarettes. He started smoking about 54 years ago. He has a 55.00 pack-year smoking history. He has never used smokeless tobacco. He reports that he does not drink alcohol and does not use drugs.  ROS: All other review of systems were reviewed and are negative except what is noted above in HPI  Physical Exam: BP 112/65   Pulse 79   Temp 98.6 F (37 C)   Ht 5' 8.5" (1.74 m)   Wt 213 lb (96.6 kg)   BMI 31.92 kg/m   Constitutional:  Alert and oriented, No acute distress. HEENT: [place] AT, moist mucus membranes.  Trachea midline, no masses. Cardiovascular: No clubbing,  cyanosis, or edema. Respiratory: Normal respiratory effort, no increased work of breathing. GI: Abdomen is soft, nontender, nondistended, no abdominal masses GU: No CVA tenderness.  Lymph: No cervical or inguinal lymphadenopathy. Skin: No rashes, bruises or suspicious lesions. Neurologic: Grossly intact, no focal deficits, moving all 4 extremities. Psychiatric: Normal mood and affect.  Laboratory Data: Lab Results  Component Value Date   WBC 7.5 04/10/2020   HGB 11.6 (L) 04/10/2020   HCT 34.3 (L) 04/10/2020   MCV 90 04/10/2020   PLT 220 04/10/2020    Lab Results  Component Value Date   CREATININE 1.43 (H) 04/10/2020    Lab Results  Component Value Date   PSA 1.6 08/10/2019    Lab Results  Component Value Date   TESTOSTERONE <3 (L) 04/10/2020    Lab Results  Component Value Date   HGBA1C 6.1 04/10/2020    Urinalysis    Component Value Date/Time   COLORURINE YELLOW 07/02/2017 0410   APPEARANCEUR Clear 02/08/2020 0931   LABSPEC 1.018 07/02/2017 0410   PHURINE 7.0 07/02/2017 0410   GLUCOSEU 2+ (A) 02/08/2020 0931   HGBUR NEGATIVE 07/02/2017 0410   BILIRUBINUR Negative 02/08/2020 0931   KETONESUR NEGATIVE 07/02/2017 0410   PROTEINUR Negative 02/08/2020 0931   PROTEINUR NEGATIVE 07/02/2017 0410   UROBILINOGEN negative 07/19/2014 1002   NITRITE Negative 02/08/2020 0931   NITRITE NEGATIVE 07/02/2017 0410   LEUKOCYTESUR Negative 02/08/2020 0931    Lab Results  Component Value Date   LABMICR Comment 02/08/2020   WBCUA None seen 01/10/2020   RBCUA None seen 08/11/2018   LABEPIT None seen 01/10/2020   MUCUS neg 06/21/2013   BACTERIA None seen 01/10/2020    Pertinent Imaging: CT 03/09/2020 and bone scan 02/28/2020: Images reviewed and discussed with the patient No results found for this or any previous visit.  No results found for this or any previous visit.  No results found for this or any previous visit.  No results found for this or any previous  visit.  No results found for this or any previous visit.  No results found for this or any previous visit.  No results found for this or any previous visit.  No results found for this or any previous visit.   Assessment & Plan:    1. Prostate cancer metastatic to intrapelvic lymph node ([place]) -We discussed the natural hx of CRPC and the various treatment options. After discussing the options we elected to proceed with xtandi.  - Urinalysis, Routine w reflex microscopic  2. BPH with nocturia -Continue uroxatral 10mg  qhs   No follow-ups on file.  Nicolette Bang, MD  [facility] [place]

## 2020-04-20 ENCOUNTER — Encounter: Payer: Self-pay | Admitting: Urology

## 2020-04-20 NOTE — Progress Notes (Signed)
Pt came by office to sign Xtandi form. Form faxed to listed number.

## 2020-05-04 ENCOUNTER — Telehealth: Payer: Self-pay

## 2020-05-04 NOTE — Telephone Encounter (Signed)
Pt called wanting to check on a med he was supposed to get. Spoke with Gibson Ramp, RN and pt told again that company would call him. He was told to be sure and answer if he received a call from a [phone] number because that is what company number is.

## 2020-05-16 ENCOUNTER — Other Ambulatory Visit: Payer: Self-pay | Admitting: Family Medicine

## 2020-05-16 DIAGNOSIS — E1165 Type 2 diabetes mellitus with hyperglycemia: Secondary | ICD-10-CM

## 2020-05-16 DIAGNOSIS — E1169 Type 2 diabetes mellitus with other specified complication: Secondary | ICD-10-CM

## 2020-05-16 DIAGNOSIS — E785 Hyperlipidemia, unspecified: Secondary | ICD-10-CM

## 2020-05-16 DIAGNOSIS — I10 Essential (primary) hypertension: Secondary | ICD-10-CM

## 2020-05-23 ENCOUNTER — Telehealth: Payer: Self-pay

## 2020-05-23 NOTE — Telephone Encounter (Signed)
Pt called to let MD know he received his Gillermina Phy and will start his first dose today.

## 2020-06-06 ENCOUNTER — Telehealth: Payer: Medicare Other | Admitting: *Deleted

## 2020-06-06 ENCOUNTER — Ambulatory Visit (INDEPENDENT_AMBULATORY_CARE_PROVIDER_SITE_OTHER): Payer: Medicare Other

## 2020-06-06 ENCOUNTER — Other Ambulatory Visit: Payer: Self-pay

## 2020-06-06 DIAGNOSIS — Z23 Encounter for immunization: Secondary | ICD-10-CM | POA: Diagnosis not present

## 2020-06-06 NOTE — Progress Notes (Signed)
   Covid-19 Vaccination Clinic  Name:  OSINACHI NAVARRETTE    MRN: [id number] DOB: 04/08/1946  06/06/2020  Mr. Spatafore was observed post Covid-19 immunization for 15 minutes without incident. He was provided with Vaccine Information Sheet and instruction to access the V-Safe system.   Mr. Casaus was instructed to call 911 with any severe reactions post vaccine: Marland Kitchen Difficulty breathing  . Swelling of face and throat  . A fast heartbeat  . A bad rash all over body  . Dizziness and weakness   Immunizations Administered    No immunizations on file.

## 2020-06-20 ENCOUNTER — Telehealth: Payer: Self-pay

## 2020-06-20 NOTE — Telephone Encounter (Signed)
He needs to make an appointment with me to reapply for patient assistance for next year 2022.  He needs to bring in his newest financial documents (ie. Social security letter stating how much he gets per month OR bank statement OR 1099)  He must reapply every year to keep getting assistance.  Once approved he can call in refills from the patient assistance company (it should be on his bottle---not local pharmacy)  30 min pharmacy clinic appt

## 2020-07-02 ENCOUNTER — Ambulatory Visit (INDEPENDENT_AMBULATORY_CARE_PROVIDER_SITE_OTHER): Payer: Medicare Other | Admitting: Pharmacist

## 2020-07-02 DIAGNOSIS — E119 Type 2 diabetes mellitus without complications: Secondary | ICD-10-CM

## 2020-07-02 NOTE — Progress Notes (Signed)
    07/02/2020 Name: Andrew [name] MRN: [id number] DOB: 12-Nov-1945   S:73 yoM with PMH significant for DM, HTN, HLD, current smokerarrives to pharmacy clinic today for diabetes evaluation, education, and management.Patient was referred and last seen by Primary Care Provider on10/12/21.Patient is also a current smoker, therefore we willcontinueexplore smoking cessation. Insurance coverage/medication affordability:UHCmedicare   T2DM-A1c 6.1%, Most recent eGFR:59  Current antihyperglycemic regimen:Metformin 1g BID,Glyxambi 10/5mg  daily  Denies hypoglycemic symptoms  Reportshyperglycemic symptoms, including polyuria, nocturia  Current meal patterns:discussed eating lower carb, increase vegetable (non-starchy) intake, protein. Avoid sugary drinks, etc, Limit dessert intake.  Current exercise:n/a, encouraged patient to increase physical activity as able Current blood glucose readings:fasting blood sugars:150-180s,FBG this AM in clinic was 160  Cardiovascular risk reduction: ? Current hypertensive regimen:lisinopril 20/hctz 25mg  daily ? Current hyperlipidemia regimen:atorvastatin 40(LDL 59on 10/11/19) ? Current antiplatelet regimen:ASA 81  Patient denieshypoglycemic events.  Patient-reported exercise habits:n/a     O:  Lab Results  Component Value Date   HGBA1C 6.1 04/10/2020   Lipid Panel     Component Value Date/Time   CHOL 134 04/10/2020 1025   CHOL 144 11/18/2012 0844   TRIG 168 (H) 04/10/2020 1025   TRIG 142 01/03/2016 0800   TRIG 161 (H) 11/18/2012 0844   HDL 29 (L) 04/10/2020 1025   HDL 31 (L) 01/03/2016 0800   HDL 27 (L) 11/18/2012 0844   CHOLHDL 4.6 04/10/2020 1025   LDLCALC 76 04/10/2020 1025   LDLCALC 45 03/08/2014 0810   LDLCALC 85 11/18/2012 0844    Home fasting blood sugars: <130  2 hour post-meal/random blood sugars: <180.   Clinical Atherosclerotic Cardiovascular Disease (ASCVD): No   The 10-year ASCVD risk score  [name], et al., 2013) is: 47.6%   Values used to calculate the score:     Age: 62 years     Sex: Male     Is Non-Hispanic African American: Yes     Diabetic: Yes     Tobacco smoker: Yes     Systolic Blood Pressure: 409 mmHg     Is BP treated: Yes     HDL Cholesterol: 29 mg/dL     Total Cholesterol: 134 mg/dL    A/P:  Diabetes T2DM currently CONTROLLED.  MEDICATION ASSISTANCE HAS ALLOWED PATIENT TO MAINTAIN GOAL A1C  -CONTINUE GLYXAMBI  AP[facility] FOR [facility] F[facility]ED OUT AND PLACED ON PCP DESK FOR SIGNATURE TO OBTAIN PATIENT'S GLYXAMBI 10/5MG    MEDICATION WILL SHIP TO PATIENT'S HOME  -CONTINUE METFORMIN  -Extensively discussed pathophysiology of diabetes, recommended lifestyle interventions, dietary effects on blood sugar control  -Counseled on s/sx of and management of hypoglycemia  -Next A1C anticipated 07/11/20.  Written patient instructions provided.  Total time in counseling 20 minutes.   Follow up PCP Clinic Visit ON 07/11/20.    Regina Eck, PharmD, BCPS Clinical Pharmacist, [place]  II Phone [phone]

## 2020-07-04 ENCOUNTER — Telehealth: Payer: Medicare Other

## 2020-07-11 ENCOUNTER — Ambulatory Visit: Payer: Medicare Other | Admitting: Family Medicine

## 2020-07-11 ENCOUNTER — Other Ambulatory Visit: Payer: Self-pay

## 2020-07-11 ENCOUNTER — Other Ambulatory Visit: Payer: Medicare Other

## 2020-07-11 DIAGNOSIS — C775 Secondary and unspecified malignant neoplasm of intrapelvic lymph nodes: Secondary | ICD-10-CM

## 2020-07-11 DIAGNOSIS — C61 Malignant neoplasm of prostate: Secondary | ICD-10-CM

## 2020-07-12 LAB — PSA: Prostate Specific Ag, Serum: 2 ng/mL (ref 0.0–4.0)

## 2020-07-13 ENCOUNTER — Telehealth: Payer: Self-pay

## 2020-07-13 NOTE — Telephone Encounter (Signed)
Pt called and made aware

## 2020-07-13 NOTE — Telephone Encounter (Signed)
Message left to return call.

## 2020-07-13 NOTE — Telephone Encounter (Signed)
-----   Message from Cleon Gustin, MD sent at 07/13/2020  8:42 AM EST ----- normal ----- Message ----- From: Iris Pert, LPN Sent: 09/26/1914   8:19 AM EST To: Cleon Gustin, MD  Please review

## 2020-07-18 ENCOUNTER — Ambulatory Visit: Payer: Medicare Other | Admitting: Urology

## 2020-07-18 DIAGNOSIS — N138 Other obstructive and reflux uropathy: Secondary | ICD-10-CM

## 2020-07-19 ENCOUNTER — Encounter: Payer: Self-pay | Admitting: Urology

## 2020-07-19 ENCOUNTER — Ambulatory Visit (INDEPENDENT_AMBULATORY_CARE_PROVIDER_SITE_OTHER): Payer: Medicare Other | Admitting: Urology

## 2020-07-19 ENCOUNTER — Other Ambulatory Visit: Payer: Self-pay

## 2020-07-19 VITALS — BP 131/74 | HR 76 | Temp 99.0°F | Ht 68.0 in | Wt 218.0 lb

## 2020-07-19 DIAGNOSIS — C61 Malignant neoplasm of prostate: Secondary | ICD-10-CM | POA: Diagnosis not present

## 2020-07-19 DIAGNOSIS — N138 Other obstructive and reflux uropathy: Secondary | ICD-10-CM | POA: Diagnosis not present

## 2020-07-19 DIAGNOSIS — N401 Enlarged prostate with lower urinary tract symptoms: Secondary | ICD-10-CM | POA: Diagnosis not present

## 2020-07-19 DIAGNOSIS — C775 Secondary and unspecified malignant neoplasm of intrapelvic lymph nodes: Secondary | ICD-10-CM

## 2020-07-19 DIAGNOSIS — R351 Nocturia: Secondary | ICD-10-CM | POA: Diagnosis not present

## 2020-07-19 LAB — URINALYSIS, ROUTINE W REFLEX MICROSCOPIC
Bilirubin, UA: NEGATIVE
Ketones, UA: NEGATIVE
Leukocytes,UA: NEGATIVE
Nitrite, UA: NEGATIVE
Protein,UA: NEGATIVE
RBC, UA: NEGATIVE
Specific Gravity, UA: 1.01 (ref 1.005–1.030)
Urobilinogen, Ur: 1 mg/dL (ref 0.2–1.0)
pH, UA: 6 (ref 5.0–7.5)

## 2020-07-19 MED ORDER — ENZALUTAMIDE 80 MG PO TABS: 160.0000 mg | ORAL_TABLET | Freq: Every day | ORAL | 11 refills | Status: DC

## 2020-07-19 MED ORDER — ALFUZOSIN HCL ER 10 MG PO TB24: 10.0000 mg | ORAL_TABLET | Freq: Every day | ORAL | 11 refills | Status: DC

## 2020-07-19 NOTE — Progress Notes (Signed)
Urological Symptom Review  Patient is experiencing the following symptoms: Frequent urination Hard to postpone urination Get up at night to urinate Leakage of urine Have to strain to urinate   Review of Systems  Gastrointestinal (upper)  : Indigestion/heartburn  Gastrointestinal (lower) : Negative for lower GI symptoms  Constitutional : Night Sweats  Skin: Negative for skin symptoms  Eyes: Blurred vision  Ear/Nose/Throat : Negative for Ear/Nose/Throat symptoms  Hematologic/Lymphatic: Negative for Hematologic/Lymphatic symptoms  Cardiovascular : None  Respiratory : Negative for respiratory symptoms  Endocrine: Negative for endocrine symptoms  Musculoskeletal: Back pain Joint pain  Neurological: Headaches Dizziness  Psychologic: Negative for psychiatric symptoms

## 2020-07-19 NOTE — Progress Notes (Signed)
07/19/2020 10:40 AM   Andrew [name] [birth year] [id number]  Referring provider: [name], MD Andrew [name],  [place] [address]  Followup prostate cancer  HPI: Andrew [name] is a 75yo here for followup for CRPC and BPH. PSA decreased to 2.0 from 3.1 since starting Xtandi. Mild fatigue. No hot flashes. He has mild LUTS on uroxatral 10mg  qhs. Nocturia 0-2x. Stream strong.Overall he is happy with his urination   PMH: Past Medical History:  Diagnosis Date  . Anemia   . BPH (benign prostatic hyperplasia)    Sees Dr Andrew [name]  . Cataract   . Diabetes mellitus without complication (Andrew [name])   . Hyperlipidemia   . Hypertension   . Low serum vitamin D   . Prostate cancer ([place]) 2008   w/seed implantation and radiation    Surgical History: Past Surgical History:  Procedure Laterality Date  . COLONOSCOPY  [id number]  . PROSTATE SURGERY  2008   seed implant    Home Medications:  Allergies as of 07/19/2020   No Known Allergies     Medication List       Accurate as of July 19, 2020 10:40 AM. If you have any questions, ask your nurse or doctor.        alfuzosin 10 MG 24 hr tablet Commonly known as: UROXATRAL Take 1 tablet (10 mg total) by mouth at bedtime.   aspirin 81 MG tablet Take 81 mg by mouth daily.   atorvastatin 40 MG tablet Commonly known as: LIPITOR TAKE 1 TABLET BY MOUTH  DAILY   cholecalciferol 1000 units tablet Commonly known as: VITAMIN D Take 1,000 Units by mouth daily.   Cinnamon 500 MG capsule Take 500 mg by mouth daily as needed (blood glucose levels).   clotrimazole-betamethasone cream Commonly known as: Lotrisone Apply 1 application topically 2 (two) times daily.   glucose blood test strip 1 each by Other route as needed for other. Use as instructed to test blood sugar daily.  One Touch Verio Test Strips DX: E11.65   Glyxambi 10-5 MG Tabs Generic drug: Empagliflozin-linaGLIPtin Take 1 tablet by mouth daily.    lisinopril-hydrochlorothiazide 20-25 MG tablet Commonly known as: ZESTORETIC TAKE 1 TABLET BY MOUTH  DAILY   metFORMIN 1000 MG tablet Commonly known as: GLUCOPHAGE TAKE 1 TABLET BY MOUTH  TWICE DAILY WITH MEALS   nabumetone 500 MG tablet Commonly known as: RELAFEN Take 2 tablets (1,000 mg total) by mouth 2 (two) times daily. For muscle and joint pain   sildenafil 25 MG tablet Commonly known as: VIAGRA Take 25 mg by mouth daily as needed for erectile dysfunction.   tadalafil 20 MG tablet Commonly known as: CIALIS Take 20 mg by mouth daily as needed for erectile dysfunction.   tamsulosin 0.4 MG Caps capsule Commonly known as: FLOMAX TAKE 1 CAPSULE BY MOUTH  DAILY   traZODone 150 MG tablet Commonly known as: DESYREL Use from 1/3 to 1 tablet nightly as needed for sleep.       Allergies: No Known Allergies  Family History: Family History  Problem Relation Age of Onset  . Diabetes Mother   . Heart disease Mother   . Kidney disease Mother        DIALYSIS  . Emphysema Father   . Deep vein thrombosis Sister   . Kidney disease Sister   . Lung cancer Sister   . Colon polyps Brother   . Hypertension Brother   . Gout Brother   . Colon cancer Neg Hx   .  Esophageal cancer Neg Hx   . Rectal cancer Neg Hx   . Prostate cancer Neg Hx     Social History:  reports that he has been smoking cigarettes. He started smoking about 55 years ago. He has a 55.00 pack-year smoking history. He has never used smokeless tobacco. He reports that he does not drink alcohol and does not use drugs.  ROS: All other review of systems were reviewed and are negative except what is noted above in HPI  Physical Exam: BP 131/74   Pulse 76   Temp 99 F (37.2 C)   Ht 5\' 8"  (1.727 m)   Wt 218 lb (98.9 kg)   BMI 33.15 kg/m   Constitutional:  Alert and oriented, No acute distress. HEENT: [place] AT, moist mucus membranes.  Trachea midline, no masses. Cardiovascular: No clubbing, cyanosis, or  edema. Respiratory: Normal respiratory effort, no increased work of breathing. GI: Abdomen is soft, nontender, nondistended, no abdominal masses GU: No CVA tenderness.  Lymph: No cervical or inguinal lymphadenopathy. Skin: No rashes, bruises or suspicious lesions. Neurologic: Grossly intact, no focal deficits, moving all 4 extremities. Psychiatric: Normal mood and affect.  Laboratory Data: Lab Results  Component Value Date   WBC 7.5 04/10/2020   HGB 11.6 (L) 04/10/2020   HCT 34.3 (L) 04/10/2020   MCV 90 04/10/2020   PLT 220 04/10/2020    Lab Results  Component Value Date   CREATININE 1.43 (H) 04/10/2020    Lab Results  Component Value Date   PSA 1.6 08/10/2019    Lab Results  Component Value Date   TESTOSTERONE <3 (L) 04/10/2020    Lab Results  Component Value Date   HGBA1C 6.1 04/10/2020    Urinalysis    Component Value Date/Time   COLORURINE YELLOW 07/02/2017 0410   APPEARANCEUR Clear 02/08/2020 0931   LABSPEC 1.018 07/02/2017 0410   PHURINE 7.0 07/02/2017 0410   GLUCOSEU 2+ (A) 02/08/2020 0931   HGBUR NEGATIVE 07/02/2017 0410   BILIRUBINUR Negative 02/08/2020 0931   KETONESUR NEGATIVE 07/02/2017 0410   PROTEINUR Negative 02/08/2020 0931   PROTEINUR NEGATIVE 07/02/2017 0410   UROBILINOGEN negative 07/19/2014 1002   NITRITE Negative 02/08/2020 0931   NITRITE NEGATIVE 07/02/2017 0410   LEUKOCYTESUR Negative 02/08/2020 0931    Lab Results  Component Value Date   LABMICR Comment 02/08/2020   WBCUA None seen 01/10/2020   RBCUA None seen 08/11/2018   LABEPIT None seen 01/10/2020   MUCUS neg 06/21/2013   BACTERIA None seen 01/10/2020    Pertinent Imaging:  No results found for this or any previous visit.  No results found for this or any previous visit.  No results found for this or any previous visit.  No results found for this or any previous visit.  No results found for this or any previous visit.  No results found for this or any previous  visit.  No results found for this or any previous visit.  No results found for this or any previous visit.   Assessment & Plan:    1. Benign prostatic hyperplasia with urinary obstruction -continue uroxatral 10mg  - Urinalysis, Routine w reflex microscopic  2. Prostate cancer metastatic to intrapelvic lymph node ([place]) -Continue xtandi 160mg  daily -RTC 1 month for eligard 45mg  -RTC 7 month with PSa and for eligard 45mg .  3. Nocturia -Continue uroxatral 10mg  qhs   No follow-ups on file.  Nicolette Bang, MD  [facility] [place]

## 2020-07-26 ENCOUNTER — Ambulatory Visit (INDEPENDENT_AMBULATORY_CARE_PROVIDER_SITE_OTHER): Payer: Medicare Other | Admitting: Family Medicine

## 2020-07-26 ENCOUNTER — Other Ambulatory Visit: Payer: Self-pay

## 2020-07-26 ENCOUNTER — Encounter: Payer: Self-pay | Admitting: Family Medicine

## 2020-07-26 VITALS — BP 115/66 | HR 66 | Temp 97.0°F | Ht 68.0 in | Wt 215.1 lb

## 2020-07-26 DIAGNOSIS — N138 Other obstructive and reflux uropathy: Secondary | ICD-10-CM

## 2020-07-26 DIAGNOSIS — E785 Hyperlipidemia, unspecified: Secondary | ICD-10-CM

## 2020-07-26 DIAGNOSIS — E559 Vitamin D deficiency, unspecified: Secondary | ICD-10-CM

## 2020-07-26 DIAGNOSIS — F5101 Primary insomnia: Secondary | ICD-10-CM

## 2020-07-26 DIAGNOSIS — E1169 Type 2 diabetes mellitus with other specified complication: Secondary | ICD-10-CM | POA: Diagnosis not present

## 2020-07-26 DIAGNOSIS — E1165 Type 2 diabetes mellitus with hyperglycemia: Secondary | ICD-10-CM

## 2020-07-26 DIAGNOSIS — I10 Essential (primary) hypertension: Secondary | ICD-10-CM | POA: Diagnosis not present

## 2020-07-26 DIAGNOSIS — M461 Sacroiliitis, not elsewhere classified: Secondary | ICD-10-CM

## 2020-07-26 DIAGNOSIS — N401 Enlarged prostate with lower urinary tract symptoms: Secondary | ICD-10-CM

## 2020-07-26 LAB — BAYER DCA HB A1C WAIVED: HB A1C (BAYER DCA - WAIVED): 6.4 % (ref <7.0)

## 2020-07-26 MED ORDER — TRAZODONE HCL 150 MG PO TABS: ORAL_TABLET | ORAL | 3 refills | Status: DC

## 2020-07-26 MED ORDER — METFORMIN HCL 1000 MG PO TABS: 1000.0000 mg | ORAL_TABLET | Freq: Two times a day (BID) | ORAL | 1 refills | Status: DC

## 2020-07-26 MED ORDER — ATORVASTATIN CALCIUM 40 MG PO TABS: 40.0000 mg | ORAL_TABLET | Freq: Every day | ORAL | 1 refills | Status: DC

## 2020-07-26 MED ORDER — PREDNISONE 10 MG PO TABS: ORAL_TABLET | ORAL | 0 refills | Status: DC

## 2020-07-26 MED ORDER — TAMSULOSIN HCL 0.4 MG PO CAPS: 0.4000 mg | ORAL_CAPSULE | Freq: Every day | ORAL | 3 refills | Status: DC

## 2020-07-26 MED ORDER — NABUMETONE 500 MG PO TABS: 1000.0000 mg | ORAL_TABLET | Freq: Two times a day (BID) | ORAL | 1 refills | Status: DC

## 2020-07-26 NOTE — Patient Instructions (Signed)

## 2020-07-26 NOTE — Progress Notes (Signed)
Subjective:  Patient ID: Andrew [name],  male    DOB: 01-Jul-1945  Age: 75 y.o.    CC: Medical Management of Chronic Issues   HPI Andrew [name] presents for  follow-up of hypertension. Patient has no history of headache chest pain or shortness of breath or recent cough. Patient also denies symptoms of TIA such as numbness weakness lateralizing. Patient denies side effects from medication. States taking it regularly.  Patient also  in for follow-up of elevated cholesterol. Doing well without complaints on current medication. Denies side effects  including myalgia and arthralgia and nausea. Also in today for liver function testing. Currently no chest pain, shortness of breath or other cardiovascular related symptoms noted.  Follow-up of diabetes. Patient does check blood sugar at home. Readings run between 95 and 140 Patient denies symptoms such as excessive hunger or urinary frequency, excessive hunger, nausea No significant hypoglycemic spells noted. Medications reviewed. Pt reports taking them regularly. Pt. denies complication/adverse reaction today.    History Andrew [name] has a past medical history of Anemia, BPH (benign prostatic hyperplasia), Cataract, Diabetes mellitus without complication ([name]), Hyperlipidemia, Hypertension, Low serum vitamin D, and Prostate cancer ([place]) (2008).   He has a past surgical history that includes Prostate surgery (2008) and Colonoscopy (0354,6568).   His family history includes Colon polyps in his brother; Deep vein thrombosis in his sister; Diabetes in his mother; Emphysema in his father; Gout in his brother; Heart disease in his mother; Hypertension in his brother; Kidney disease in his mother and sister; Lung cancer in his sister.He reports that he has been smoking cigarettes. He started smoking about 55 years ago. He has a 55.00 pack-year smoking history. He has never used smokeless tobacco. He reports that he does not drink alcohol and does not use  drugs.  Current Outpatient Medications on File Prior to Visit  Medication Sig Dispense Refill  . alfuzosin (UROXATRAL) 10 MG 24 hr tablet Take 1 tablet (10 mg total) by mouth at bedtime. 30 tablet 11  . aspirin 81 MG tablet Take 81 mg by mouth daily.    . cholecalciferol (VITAMIN D) 1000 UNITS tablet Take 1,000 Units by mouth daily.     . Cinnamon 500 MG capsule Take 500 mg by mouth daily as needed (blood glucose levels).    . clotrimazole-betamethasone (LOTRISONE) cream Apply 1 application topically 2 (two) times daily. 30 g 0  . Empagliflozin-linaGLIPtin (GLYXAMBI) 10-5 MG TABS Take 1 tablet by mouth daily.    . enzalutamide (XTANDI) 80 MG tablet Take 2 tablets (160 mg total) by mouth daily. 60 tablet 11  . glucose blood test strip 1 each by Other route as needed for other. Use as instructed to test blood sugar daily.  One Touch Verio Test Strips DX: E11.65 100 each 11  . lisinopril-hydrochlorothiazide (ZESTORETIC) 20-25 MG tablet TAKE 1 TABLET BY MOUTH  DAILY 90 tablet 1  . sildenafil (VIAGRA) 25 MG tablet Take 25 mg by mouth daily as needed for erectile dysfunction.     . tadalafil (CIALIS) 20 MG tablet Take 20 mg by mouth daily as needed for erectile dysfunction.      No current facility-administered medications on file prior to visit.    ROS Review of Systems  Constitutional: Negative.   HENT: Negative.   Eyes: Negative for visual disturbance.  Respiratory: Negative for cough and shortness of breath.   Cardiovascular: Negative for chest pain and leg swelling.  Gastrointestinal: Negative for abdominal pain, diarrhea, nausea and vomiting.  Genitourinary: Negative for difficulty urinating.  Musculoskeletal: Positive for arthralgias and back pain (at sacroiliac region bilaterally, moderate). Negative for myalgias.  Skin: Negative for rash.  Neurological: Negative for headaches.  Psychiatric/Behavioral: Negative for sleep disturbance.    Objective:  BP 115/66   Pulse 66   Temp  (!) 97 F (36.1 C) (Temporal)   Ht _0  (1.727 m)   Wt 215 lb 1.6 oz (97.6 kg)   BMI 32.71 kg/m   BP Readings from Last 3 Encounters:  07/26/20 115/66  07/19/20 131/74  04/17/20 112/65    Wt Readings from Last 3 Encounters:  07/26/20 215 lb 1.6 oz (97.6 kg)  07/19/20 218 lb (98.9 kg)  04/17/20 213 lb (96.6 kg)     Physical Exam Constitutional:      General: He is not in acute distress.    Appearance: He is well-developed.  HENT:     Head: Normocephalic and atraumatic.     Right Ear: External ear normal.     Left Ear: External ear normal.     Nose: Nose normal.  Eyes:     Conjunctiva/sclera: Conjunctivae normal.     Pupils: Pupils are equal, round, and reactive to light.  Cardiovascular:     Rate and Rhythm: Normal rate and regular rhythm.     Heart sounds: Normal heart sounds. No murmur heard.   Pulmonary:     Effort: Pulmonary effort is normal. No respiratory distress.     Breath sounds: Normal breath sounds. No wheezing or rales.  Abdominal:     Palpations: Abdomen is soft.     Tenderness: There is no abdominal tenderness.  Musculoskeletal:        General: Tenderness (bilateral at SI joints) present. Normal range of motion.     Cervical back: Normal range of motion and neck supple.  Skin:    General: Skin is warm and dry.  Neurological:     Mental Status: He is alert and oriented to person, place, and time.     Deep Tendon Reflexes: Reflexes are normal and symmetric.  Psychiatric:        Behavior: Behavior normal.        Thought Content: Thought content normal.        Judgment: Judgment normal.     Diabetic Foot Exam - Simple   No data filed       Assessment & Plan:   Andrew [name] was seen today for medical management of chronic issues.  Diagnoses and all orders for this visit:  Essential hypertension -     CBC with Differential/Platelet -     CMP14+EGFR -     Lipid panel  Primary insomnia -     CBC with Differential/Platelet -      CMP14+EGFR -     Lipid panel -     traZODone (DESYREL) 150 MG tablet; Use from 1/3 to 1 tablet nightly as needed for sleep.  Uncontrolled type 2 diabetes mellitus with hyperglycemia (HCC) -     CBC with Differential/Platelet -     CMP14+EGFR -     Lipid panel -     Bayer DCA Hb A1c Waived -     metFORMIN (GLUCOPHAGE) 1000 MG tablet; Take 1 tablet (1,000 mg total) by mouth 2 (two) times daily with a meal.  Hyperlipidemia associated with type 2 diabetes mellitus ([place]) -     CBC with Differential/Platelet -     CMP14+EGFR -     Lipid panel -  atorvastatin (LIPITOR) 40 MG tablet; Take 1 tablet (40 mg total) by mouth daily.  Vitamin D deficiency -     CBC with Differential/Platelet -     CMP14+EGFR -     Lipid panel -     VITAMIN D 25 Hydroxy (Vit-D Deficiency, Fractures)  Sacroiliitis (HCC) -     nabumetone (RELAFEN) 500 MG tablet; Take 2 tablets (1,000 mg total) by mouth 2 (two) times daily. For muscle and joint pain -     predniSONE (DELTASONE) 10 MG tablet; Take 5 daily for 3 days followed by 4,3,2 and 1 for 3 days each.  Benign prostatic hyperplasia with urinary obstruction -     tamsulosin (FLOMAX) 0.4 MG CAPS capsule; Take 1 capsule (0.4 mg total) by mouth daily.   I have changed Andrew [name]. Andrew [name]'s tamsulosin, metFORMIN, and atorvastatin. I am also having him start on predniSONE. Additionally, I am having him maintain his aspirin, cholecalciferol, Cinnamon, glucose blood, tadalafil, sildenafil, Glyxambi, clotrimazole-betamethasone, lisinopril-hydrochlorothiazide, alfuzosin, enzalutamide, traZODone, and nabumetone.  Meds ordered this encounter  Medications  . traZODone (DESYREL) 150 MG tablet    Sig: Use from 1/3 to 1 tablet nightly as needed for sleep.    Dispense:  90 tablet    Refill:  3  . tamsulosin (FLOMAX) 0.4 MG CAPS capsule    Sig: Take 1 capsule (0.4 mg total) by mouth daily.    Dispense:  90 capsule    Refill:  3    Requesting 1 year supply  . nabumetone  (RELAFEN) 500 MG tablet    Sig: Take 2 tablets (1,000 mg total) by mouth 2 (two) times daily. For muscle and joint pain    Dispense:  360 tablet    Refill:  1  . metFORMIN (GLUCOPHAGE) 1000 MG tablet    Sig: Take 1 tablet (1,000 mg total) by mouth 2 (two) times daily with a meal.    Dispense:  180 tablet    Refill:  1  . predniSONE (DELTASONE) 10 MG tablet    Sig: Take 5 daily for 3 days followed by 4,3,2 and 1 for 3 days each.    Dispense:  45 tablet    Refill:  0  . atorvastatin (LIPITOR) 40 MG tablet    Sig: Take 1 tablet (40 mg total) by mouth daily.    Dispense:  90 tablet    Refill:  1     Follow-up: Return in about 3 months (around 10/24/2020).  [name], M.D.

## 2020-07-27 LAB — CBC WITH DIFFERENTIAL/PLATELET
Basophils Absolute: 0.1 10*3/uL (ref 0.0–0.2)
Basos: 1 %
EOS (ABSOLUTE): 0.3 10*3/uL (ref 0.0–0.4)
Eos: 4 %
Hematocrit: 31.7 % — ABNORMAL LOW (ref 37.5–51.0)
Hemoglobin: 11.1 g/dL — ABNORMAL LOW (ref 13.0–17.7)
Immature Grans (Abs): 0 10*3/uL (ref 0.0–0.1)
Immature Granulocytes: 0 %
Lymphocytes Absolute: 2.2 10*3/uL (ref 0.7–3.1)
Lymphs: 32 %
MCH: 31.6 pg (ref 26.6–33.0)
MCHC: 35 g/dL (ref 31.5–35.7)
MCV: 90 fL (ref 79–97)
Monocytes Absolute: 0.6 10*3/uL (ref 0.1–0.9)
Monocytes: 8 %
Neutrophils Absolute: 3.7 10*3/uL (ref 1.4–7.0)
Neutrophils: 55 %
Platelets: 213 10*3/uL (ref 150–450)
RBC: 3.51 x10E6/uL — ABNORMAL LOW (ref 4.14–5.80)
RDW: 12.6 % (ref 11.6–15.4)
WBC: 6.8 10*3/uL (ref 3.4–10.8)

## 2020-07-27 LAB — CMP14+EGFR
ALT: 17 IU/L (ref 0–44)
AST: 21 IU/L (ref 0–40)
Albumin/Globulin Ratio: 1.3 (ref 1.2–2.2)
Albumin: 3.8 g/dL (ref 3.7–4.7)
Alkaline Phosphatase: 104 IU/L (ref 44–121)
BUN/Creatinine Ratio: 15 (ref 10–24)
BUN: 23 mg/dL (ref 8–27)
Bilirubin Total: 0.3 mg/dL (ref 0.0–1.2)
CO2: 24 mmol/L (ref 20–29)
Calcium: 8.9 mg/dL (ref 8.6–10.2)
Chloride: 102 mmol/L (ref 96–106)
Creatinine, Ser: 1.53 mg/dL — ABNORMAL HIGH (ref 0.76–1.27)
GFR calc Af Amer: 51 mL/min/{1.73_m2} — ABNORMAL LOW (ref >59)
GFR calc non Af Amer: 44 mL/min/{1.73_m2} — ABNORMAL LOW (ref >59)
Globulin, Total: 2.9 g/dL (ref 1.5–4.5)
Glucose: 105 mg/dL — ABNORMAL HIGH (ref 65–99)
Potassium: 3.8 mmol/L (ref 3.5–5.2)
Sodium: 140 mmol/L (ref 134–144)
Total Protein: 6.7 g/dL (ref 6.0–8.5)

## 2020-07-27 LAB — VITAMIN D 25 HYDROXY (VIT D DEFICIENCY, FRACTURES): Vit D, 25-Hydroxy: 33.9 ng/mL (ref 30.0–100.0)

## 2020-07-27 LAB — LIPID PANEL
Chol/HDL Ratio: 3.6 ratio (ref 0.0–5.0)
Cholesterol, Total: 109 mg/dL (ref 100–199)
HDL: 30 mg/dL — ABNORMAL LOW (ref >39)
LDL Chol Calc (NIH): 54 mg/dL (ref 0–99)
Triglycerides: 139 mg/dL (ref 0–149)
VLDL Cholesterol Cal: 25 mg/dL (ref 5–40)

## 2020-07-27 NOTE — Progress Notes (Signed)
Hello Andrew [name],  Your lab result is normal and/or stable.Some minor variations that are not significant are commonly marked abnormal, but do not represent any medical problem for you.  Best regards, [name], M.D.

## 2020-08-01 ENCOUNTER — Other Ambulatory Visit: Payer: Self-pay | Admitting: Family Medicine

## 2020-08-01 DIAGNOSIS — E1165 Type 2 diabetes mellitus with hyperglycemia: Secondary | ICD-10-CM

## 2020-08-06 ENCOUNTER — Other Ambulatory Visit: Payer: Medicare Other

## 2020-08-13 ENCOUNTER — Ambulatory Visit: Payer: Medicare Other | Admitting: Urology

## 2020-08-16 ENCOUNTER — Ambulatory Visit (INDEPENDENT_AMBULATORY_CARE_PROVIDER_SITE_OTHER): Payer: Medicare Other

## 2020-08-16 ENCOUNTER — Other Ambulatory Visit: Payer: Self-pay

## 2020-08-16 DIAGNOSIS — C61 Malignant neoplasm of prostate: Secondary | ICD-10-CM

## 2020-08-16 MED ORDER — LEUPROLIDE ACETATE (6 MONTH) 45 MG ~~LOC~~ KIT
45.0000 mg | PACK | Freq: Once | SUBCUTANEOUS | Status: AC
  Administered 2020-08-16: 45 mg via SUBCUTANEOUS

## 2020-08-16 NOTE — Progress Notes (Signed)
Eligard SubQ Injection   Due to Prostate Cancer patient is present today for a Eligard Injection.  Medication: Eligard 45 month Dose: 45 mg  Location: left  Lot: [id number] Exp: 12/2021  Patient tolerated well, no complications were noted  Performed by: Quinette Hentges, lpn

## 2020-08-16 NOTE — Patient Instructions (Signed)
Hormone Suppression Therapy for Prostate Cancer  Hormone suppression therapy is a treatment that can help to slow the growth of cancer cells in the prostate gland. It is also called androgen deprivation therapy (ADT) or androgen suppression therapy. Hormone suppression therapy targets hormones in the body that help cancer cells grow-these hormones are called androgens. Hormone suppression therapy alone will not cure prostate cancer, but it can slow the growth of cancer cells and may shrink tumors over time. Your health care provider can help you find the best treatment that fits your lifestyle. Hormone suppression therapy may be used in the following cases:  When prostate cancer has spread too far to other places in the body and cannot be cured by surgery or radiation.  When a person has health problems that prevent them from having surgery or radiation.  Before radiation to help shrink the size of the cancer and make the radiation treatment more effective.  If the prostate cancer remains or comes back following treatment with surgery or radiation. What are the types of hormone suppression therapy? Orchiectomy Orchiectomy, also called surgical castration, is a surgery to remove one or both testicles. The testicles make the two main androgens-testosterone and dihydrotestosterone. This surgery reduces the levels of testosterone in the blood, leading to decreased androgen production. Medicine therapy Medicine therapy, also called medical or chemical castration, involves taking medicines to keep your body from making or using androgens. Medicines can do this in one of three ways: 1. Reducing androgen production by the testicles: ? Luteinizing hormone-releasing hormone (LHRH) agonist medicines. These medicines are injected or implanted under your skin to lower the amount of androgens that your testicles make. If you take these medicines, you may also be prescribed other medicines to help with side  effects. ? LHRH antagonist medicines. These medicines also work to lower the amount of androgens made in the testicles but they work faster than LHRH agonist medicines and have less severe side effects. They are given as a monthly injection under the skin, and they are used when prostate cancer is in an advanced stage. ? Estrogens (male hormones). These medicines help reduce androgen production by the testicles. Usually, other types of hormone suppression therapy are used first based. If they do not work well or if they stop working, then estrogen may be given. 2. Blocking androgen attachment throughout the body. ? Anti-androgen medicines, also called androgen receptor antagonists, block areas on the body where androgens attach. These are pills that are usually used in combination with other types of hormone suppression therapy, like orchiectomy and other medicines. 3. Blocking androgen production throughout the body. ? Androgen synthesis inhibitor medicines. These medicines help to stop other areas of the body from making androgens. They are taken as pills. They may be used if the prostate cancer is advanced and has not gotten better with surgery or other medicines. A steroid medicine may be given with this type of medicine to help with side effects. What are the risks? Hormone suppression therapy may cause side effects, including:  Hot flashes.  Sexual side effects: ? Decrease or lack of sexual desire. ? Decrease in size of penis or testicles. ? Inability to get an erection (erectile dysfunction, or impotence). ? Breast tenderness or increase in breast size.  Fatigue.  Weight gain.  Thinning of the bones (osteoporosis) or loss of muscle mass.  Depression.  Increased cholesterol levels.  Trouble with thinking or focusing. Hormone suppression therapy may also increase your risk of high blood pressure (  hypertension), stroke, heart attack, or diabetes. What are the benefits? One of the  main benefits of hormone suppression therapy is having additional treatment options. You may have only one type of treatment or two or more types at the same time. Treatments may be combined to:  Help with side effects.  Treat advanced cancer. Contact a health care provider if:  You have pain or side effects that do not get better with treatment.  You have trouble urinating.  You have new side effects that do not go away. Get help right away if:  You have severe chest pain.  You have trouble breathing.  You have an irregular heartbeat.  You have numbness or paralysis in the lower half of your body.  You are confused.  You have trouble talking or understanding speech. These symptoms may be an emergency. Do not wait to see if the symptoms will go away. Get medical help right away. Call your local emergency services (911 in the [place] Do not drive yourself to the hospital. Summary  Hormone suppression therapy is a treatment that can help to slow the growth of cancer cells in the prostate (prostate gland).  Hormone suppression therapy alone will not cure prostate cancer, but it can slow the growth of prostate cancer and may shrink tumors over time.  Treatment to suppress hormones may include surgery or medicines. This information is not intended to replace advice given to you by your health care provider. Make sure you discuss any questions you have with your health care provider. Document Revised: 05/12/2019 Document Reviewed: 05/12/2019 Elsevier Patient Education  2021 [employer] Inc[employer]  

## 2020-09-05 ENCOUNTER — Ambulatory Visit: Payer: Medicare Other

## 2020-10-24 ENCOUNTER — Ambulatory Visit (INDEPENDENT_AMBULATORY_CARE_PROVIDER_SITE_OTHER): Payer: Medicare Other | Admitting: Family Medicine

## 2020-10-24 ENCOUNTER — Other Ambulatory Visit: Payer: Self-pay

## 2020-10-24 ENCOUNTER — Encounter: Payer: Self-pay | Admitting: Family Medicine

## 2020-10-24 VITALS — BP 130/79 | HR 67 | Temp 96.9°F | Ht 68.0 in | Wt 215.8 lb

## 2020-10-24 DIAGNOSIS — E1169 Type 2 diabetes mellitus with other specified complication: Secondary | ICD-10-CM

## 2020-10-24 DIAGNOSIS — E785 Hyperlipidemia, unspecified: Secondary | ICD-10-CM

## 2020-10-24 DIAGNOSIS — E1165 Type 2 diabetes mellitus with hyperglycemia: Secondary | ICD-10-CM

## 2020-10-24 LAB — CBC WITH DIFFERENTIAL/PLATELET
Basophils Absolute: 0.1 10*3/uL (ref 0.0–0.2)
Basos: 1 %
EOS (ABSOLUTE): 0.3 10*3/uL (ref 0.0–0.4)
Eos: 6 %
Hematocrit: 35.5 % — ABNORMAL LOW (ref 37.5–51.0)
Hemoglobin: 11.8 g/dL — ABNORMAL LOW (ref 13.0–17.7)
Immature Grans (Abs): 0 10*3/uL (ref 0.0–0.1)
Immature Granulocytes: 0 %
Lymphocytes Absolute: 1.7 10*3/uL (ref 0.7–3.1)
Lymphs: 28 %
MCH: 30.2 pg (ref 26.6–33.0)
MCHC: 33.2 g/dL (ref 31.5–35.7)
MCV: 91 fL (ref 79–97)
Monocytes Absolute: 0.5 10*3/uL (ref 0.1–0.9)
Monocytes: 8 %
Neutrophils Absolute: 3.6 10*3/uL (ref 1.4–7.0)
Neutrophils: 57 %
Platelets: 226 10*3/uL (ref 150–450)
RBC: 3.91 x10E6/uL — ABNORMAL LOW (ref 4.14–5.80)
RDW: 12.5 % (ref 11.6–15.4)
WBC: 6.2 10*3/uL (ref 3.4–10.8)

## 2020-10-24 LAB — CMP14+EGFR
ALT: 15 IU/L (ref 0–44)
AST: 23 IU/L (ref 0–40)
Albumin/Globulin Ratio: 1.3 (ref 1.2–2.2)
Albumin: 3.8 g/dL (ref 3.7–4.7)
Alkaline Phosphatase: 107 IU/L (ref 44–121)
BUN/Creatinine Ratio: 14 (ref 10–24)
BUN: 20 mg/dL (ref 8–27)
Bilirubin Total: 0.3 mg/dL (ref 0.0–1.2)
CO2: 22 mmol/L (ref 20–29)
Calcium: 9.2 mg/dL (ref 8.6–10.2)
Chloride: 100 mmol/L (ref 96–106)
Creatinine, Ser: 1.47 mg/dL — ABNORMAL HIGH (ref 0.76–1.27)
Globulin, Total: 3 g/dL (ref 1.5–4.5)
Glucose: 107 mg/dL — ABNORMAL HIGH (ref 65–99)
Potassium: 3.8 mmol/L (ref 3.5–5.2)
Sodium: 140 mmol/L (ref 134–144)
Total Protein: 6.8 g/dL (ref 6.0–8.5)
eGFR: 50 mL/min/{1.73_m2} — ABNORMAL LOW (ref >59)

## 2020-10-24 LAB — LIPID PANEL
Chol/HDL Ratio: 3.8 ratio (ref 0.0–5.0)
Cholesterol, Total: 127 mg/dL (ref 100–199)
HDL: 33 mg/dL — ABNORMAL LOW (ref >39)
LDL Chol Calc (NIH): 77 mg/dL (ref 0–99)
Triglycerides: 90 mg/dL (ref 0–149)
VLDL Cholesterol Cal: 17 mg/dL (ref 5–40)

## 2020-10-24 LAB — BAYER DCA HB A1C WAIVED: HB A1C (BAYER DCA - WAIVED): 6 % (ref <7.0)

## 2020-10-25 ENCOUNTER — Encounter: Payer: Self-pay | Admitting: Family Medicine

## 2020-10-25 NOTE — Progress Notes (Signed)
 Subjective:  Patient ID: Andrew [name], male    DOB: 01/02/1946  Age: 74 y.o. MRN: [id number]  CC: Medical Management of Chronic Issues   HPI Andrew [name] presents forFollow-up of diabetes. Patient checks blood sugar at home.   100 fasting and 150 postprandial Patient denies symptoms such as polyuria, polydipsia, excessive hunger, nausea No significant hypoglycemic spells noted. Medications reviewed. Pt reports taking them regularly without complication/adverse reaction being reported today.    History Andrew [name] has a past medical history of Anemia, BPH (benign prostatic hyperplasia), Cataract, Diabetes mellitus without complication (H[facility]), Hyperlipidemia, Hypertension, Low serum vitamin D, and Prostate cancer ([facility]) (2008).   He has a past surgical history that includes Prostate surgery (2008) and Colonoscopy (2015,2018).   His family history includes Colon polyps in his brother; Deep vein thrombosis in his sister; Diabetes in his mother; Emphysema in his father; Gout in his brother; Heart disease in his mother; Hypertension in his brother; Kidney disease in his mother and sister; Lung cancer in his sister.He reports that he has been smoking cigarettes. He started smoking about 55 years ago. He has a 55.00 pack-year smoking history. He has never used smokeless tobacco. He reports that he does not drink alcohol and does not use drugs.  Current Outpatient Medications on File Prior to Visit  Medication Sig Dispense Refill  . alfuzosin (UROXATRAL) 10 MG 24 hr tablet Take 1 tablet (10 mg total) by mouth at bedtime. 30 tablet 11  . aspirin 81 MG tablet Take 81 mg by mouth daily.    . atorvastatin (LIPITOR) 40 MG tablet Take 1 tablet (40 mg total) by mouth daily. 90 tablet 1  . cholecalciferol (VITAMIN D) 1000 UNITS tablet Take 1,000 Units by mouth daily.     . Cinnamon 500 MG capsule Take 500 mg by mouth daily as needed (blood glucose levels).    . clotrimazole-betamethasone (LOTRISONE) cream Apply  1 application topically 2 (two) times daily. 30 g 0  . Empagliflozin-linaGLIPtin (GLYXAMBI) 10-5 MG TABS Take 1 tablet by mouth daily.    . enzalutamide (XTANDI) 80 MG tablet Take 2 tablets (160 mg total) by mouth daily. 60 tablet 11  . glucose blood test strip 1 each by Other route as needed for other. Use as instructed to test blood sugar daily.  One Touch Verio Test Strips DX: E11.65 100 each 11  . lisinopril-hydrochlorothiazide (ZESTORETIC) 20-25 MG tablet TAKE 1 TABLET BY MOUTH  DAILY 90 tablet 1  . metFORMIN (GLUCOPHAGE) 1000 MG tablet TAKE 1 TABLET BY MOUTH  TWICE DAILY WITH MEALS 180 tablet 0  . nabumetone (RELAFEN) 500 MG tablet Take 2 tablets (1,000 mg total) by mouth 2 (two) times daily. For muscle and joint pain 360 tablet 1  . sildenafil (VIAGRA) 25 MG tablet Take 25 mg by mouth daily as needed for erectile dysfunction.     . tadalafil (CIALIS) 20 MG tablet Take 20 mg by mouth daily as needed for erectile dysfunction.     . tamsulosin (FLOMAX) 0.4 MG CAPS capsule Take 1 capsule (0.4 mg total) by mouth daily. 90 capsule 3  . traZODone (DESYREL) 150 MG tablet Use from 1/3 to 1 tablet nightly as needed for sleep. 90 tablet 3   No current facility-administered medications on file prior to visit.    ROS Review of Systems  Constitutional: Negative for fever.  Respiratory: Negative for shortness of breath.   Cardiovascular: Negative for chest pain.  Musculoskeletal: Negative for arthralgias.  Skin: Negative for   rash.    Objective:  BP 130/79   Pulse 67   Temp (!) 96.9 F (36.1 C)   Ht 5' 8" (1.727 m)   Wt 215 lb 12.8 oz (97.9 kg)   SpO2 99%   BMI 32.81 kg/m   BP Readings from Last 3 Encounters:  10/24/20 130/79  07/26/20 115/66  07/19/20 131/74    Wt Readings from Last 3 Encounters:  10/24/20 215 lb 12.8 oz (97.9 kg)  07/26/20 215 lb 1.6 oz (97.6 kg)  07/19/20 218 lb (98.9 kg)     Physical Exam Vitals reviewed.  Constitutional:      Appearance: He is  well-developed.  HENT:     Head: Normocephalic and atraumatic.     Right Ear: Tympanic membrane and external ear normal. No decreased hearing noted.     Left Ear: Tympanic membrane and external ear normal. No decreased hearing noted.     Mouth/Throat:     Pharynx: No oropharyngeal exudate or posterior oropharyngeal erythema.  Eyes:     Pupils: Pupils are equal, round, and reactive to light.  Cardiovascular:     Rate and Rhythm: Normal rate and regular rhythm.     Heart sounds: No murmur heard.   Pulmonary:     Effort: No respiratory distress.     Breath sounds: Normal breath sounds.  Abdominal:     General: Bowel sounds are normal.     Palpations: Abdomen is soft. There is no mass.     Tenderness: There is no abdominal tenderness.  Musculoskeletal:     Cervical back: Normal range of motion and neck supple.       Assessment & Plan:   Andrew [name] was seen today for medical management of chronic issues.  Diagnoses and all orders for this visit:  Uncontrolled type 2 diabetes mellitus with hyperglycemia (HCC) -     Bayer DCA Hb A1c Waived -     CBC with Differential/Platelet -     CMP14+EGFR  Hyperlipidemia associated with type 2 diabetes mellitus ([facility]) -     Lipid panel      I have discontinued Andrew [name]'s predniSONE. I am also having him maintain his aspirin, cholecalciferol, Cinnamon, glucose blood, tadalafil, sildenafil, Glyxambi, clotrimazole-betamethasone, lisinopril-hydrochlorothiazide, alfuzosin, enzalutamide, traZODone, tamsulosin, nabumetone, atorvastatin, and metFORMIN.  No orders of the defined types were placed in this encounter.    Follow-up: Return in about 3 months (around 01/23/2021).  Warren Stacks, M.D. 

## 2020-10-25 NOTE — Progress Notes (Signed)
Hello [place],  Your lab result is normal and/or stable.Some minor variations that are not significant are commonly marked abnormal, but do not represent any medical problem for you.  Best regards, [name], M.D.

## 2020-11-04 ENCOUNTER — Other Ambulatory Visit: Payer: Self-pay | Admitting: Family Medicine

## 2020-11-04 DIAGNOSIS — E785 Hyperlipidemia, unspecified: Secondary | ICD-10-CM

## 2020-11-04 DIAGNOSIS — I10 Essential (primary) hypertension: Secondary | ICD-10-CM

## 2020-11-04 DIAGNOSIS — E1169 Type 2 diabetes mellitus with other specified complication: Secondary | ICD-10-CM

## 2020-11-04 DIAGNOSIS — E1165 Type 2 diabetes mellitus with hyperglycemia: Secondary | ICD-10-CM

## 2020-11-15 ENCOUNTER — Ambulatory Visit (INDEPENDENT_AMBULATORY_CARE_PROVIDER_SITE_OTHER): Payer: Medicare Other | Admitting: Pharmacist

## 2020-11-15 ENCOUNTER — Telehealth: Payer: Self-pay | Admitting: *Deleted

## 2020-11-15 ENCOUNTER — Other Ambulatory Visit: Payer: Self-pay

## 2020-11-15 ENCOUNTER — Ambulatory Visit: Payer: Medicare Other | Admitting: *Deleted

## 2020-11-15 DIAGNOSIS — E119 Type 2 diabetes mellitus without complications: Secondary | ICD-10-CM | POA: Diagnosis not present

## 2020-11-15 DIAGNOSIS — Z9189 Other specified personal risk factors, not elsewhere classified: Secondary | ICD-10-CM

## 2020-11-15 DIAGNOSIS — I10 Essential (primary) hypertension: Secondary | ICD-10-CM

## 2020-11-15 NOTE — Chronic Care Management (AMB) (Signed)
Chronic Care Management   CCM RN Visit Note  11/15/2020 Name: Andrew [name] MRN: [id number] DOB: May 11, 1946  Subjective: Andrew [name] is a 75 y.o. year old male who is a primary care patient of Stacks, Cletus Gash, MD. The care management team was consulted for assistance with disease management and care coordination needs.    Collaboration with Lottie Dawson, PharmD for Care Coordination in response to provider referral for case management and/or care coordination services.   Consent to Services:  Patient was given information about CCM program previously and consented to services  Patient agreed to services and verbal consent obtained.   Assessment: Review of patient past medical history, allergies, medications, health status, including review of consultants reports, laboratory and other test data, was performed as part of comprehensive evaluation and provision of chronic care management services.   SDOH (Social Determinants of Health) assessments and interventions performed:    CCM Care Plan  No Known Allergies  Outpatient Encounter Medications as of 11/15/2020  Medication Sig Note  . alfuzosin (UROXATRAL) 10 MG 24 hr tablet Take 1 tablet (10 mg total) by mouth at bedtime.   Marland Kitchen aspirin 81 MG tablet Take 81 mg by mouth daily.   Marland Kitchen atorvastatin (LIPITOR) 40 MG tablet TAKE 1 TABLET BY MOUTH  DAILY   . cholecalciferol (VITAMIN D) 1000 UNITS tablet Take 1,000 Units by mouth daily.    . Cinnamon 500 MG capsule Take 500 mg by mouth daily as needed (blood glucose levels).   . clotrimazole-betamethasone (LOTRISONE) cream Apply 1 application topically 2 (two) times daily.   . Empagliflozin-linaGLIPtin (GLYXAMBI) 10-5 MG TABS Take 1 tablet by mouth daily. 11/07/2019: #21 tabs [id number] A EXP 3/22  . enzalutamide (XTANDI) 80 MG tablet Take 2 tablets (160 mg total) by mouth daily.   Marland Kitchen glucose blood test strip 1 each by Other route as needed for other. Use as instructed to test blood sugar daily.  One  Touch Verio Test Strips DX: E11.65 10/17/2019: One Touch Verio  . lisinopril-hydrochlorothiazide (ZESTORETIC) 20-25 MG tablet TAKE 1 TABLET BY MOUTH  DAILY   . metFORMIN (GLUCOPHAGE) 1000 MG tablet TAKE 1 TABLET BY MOUTH  TWICE DAILY WITH MEALS   . nabumetone (RELAFEN) 500 MG tablet Take 2 tablets (1,000 mg total) by mouth 2 (two) times daily. For muscle and joint pain   . sildenafil (VIAGRA) 25 MG tablet Take 25 mg by mouth daily as needed for erectile dysfunction.    . tadalafil (CIALIS) 20 MG tablet Take 20 mg by mouth daily as needed for erectile dysfunction.    . tamsulosin (FLOMAX) 0.4 MG CAPS capsule Take 1 capsule (0.4 mg total) by mouth daily.   . traZODone (DESYREL) 150 MG tablet Use from 1/3 to 1 tablet nightly as needed for sleep.    No facility-administered encounter medications on file as of 11/15/2020.    Patient Active Problem List   Diagnosis Date Noted  . Primary insomnia 07/26/2020  . Sacroiliitis ([place]) 07/26/2020  . Nocturia 02/08/2020  . Nonspecific abnormal electrocardiogram (ECG) (EKG) 07/18/2019  . Type 2 diabetes mellitus with complication, without long-term current use of insulin (Andrew [name]) 07/18/2019  . Educated about COVID-19 virus infection 07/18/2019  . SOB (shortness of breath) 12/10/2016  . Erectile dysfunction due to arterial insufficiency 10/30/2016  . Polyp of colon 10/30/2016  . Peripheral vascular insufficiency (Andrew [name]) 06/13/2016  . Vitamin D deficiency 08/24/2015  . Prostate cancer metastatic to intrapelvic lymph node (Andrew [name]) 04/07/2014  . Diabetes mellitus type 2, uncontrolled (  Andrew [name]) 09/14/2010  . Benign prostatic hyperplasia with urinary obstruction 09/14/2010  . Essential hypertension 09/14/2010  . Hyperlipidemia associated with type 2 diabetes mellitus ([place]) 09/14/2010  . 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  . Conditions: HTN, DM, HLD, prostate CA  Nurse Case Manager Clinical Goal(s):  . patient will verbalize understanding of plan for obtaining dental care . patient will work with [place] [facility] to address needs related to affordable dental care  Interventions:  . 1:1 collaboration with [name], MD regarding development and update of comprehensive plan of care as evidenced by provider attestation and co-signature . Inter-disciplinary care team collaboration (see longitudinal plan of care) . Reviewed medication list . Consulted by Lottie Dawson, PharmD regarding patient's dental needs o Unable to visually assess due to use of mask  o Patient reports needing dental care. Has missing teeth and problems with partial/bridge o Unable to afford dental care . Almyra Free provided with telephone number for [facility] Dept . RNCM placed referral to [facility] Care Guides requesting assistance with finding an affordable dental provider for patient . RN scheduled appointment for f/u on   Self Care Activities:  . Attends all scheduled provider appointments . Calls provider office for new concerns or questions  Patient Goals Over the next 30 days, patient will: . brush teeth twice a day . floss teeth every day . use fluoride toothpaste  . Reach out to [facility] Dept regarding dental care . Talk with [facility] Care Guides to obtain information on dental clinics  . Schedule appointment with a dentist from the list provided by the [facility] Care Guides . Call CCM Team as needed     Follow Up Plan:  . Telephone follow up appointment with care management team member scheduled for: 11/29/20 with  RNCM  Chong Sicilian, BSN, RN-BC [place] / Andrew [name] Management Direct Dial: [phone]

## 2020-11-15 NOTE — Progress Notes (Signed)
    11/15/2020 Name: Andrew [name] MRN: [id number] DOB: 18-Jul-1945   S:75 yoM with PMH significant for DM, HTN, HLD, current smokerarrives to pharmacy clinic today for diabetes evaluation, education, and management.Patient was referred and last seen by Primary Care Provider on4/27/22.Patient is also a current smoker, therefore we willcontinueexplore smoking cessation. Insurance coverage/medication affordability:UHCmedicare   T2DM-A1c 6%, Most recent eGFR:50  Current antihyperglycemic regimen:Metformin 1g BID,Glyxambi 10/5mg  daily  Denies hypoglycemic symptoms  Reportshyperglycemic symptoms, including polyuria, nocturia  Current meal patterns:discussed eating lower carb, increase vegetable (non-starchy) intake, protein. Avoid sugary drinks, etc, Limit dessert intake.  Current exercise:n/a, encouraged patient to increase physical activity as able Current blood glucose readings:fasting blood sugars:150-180s,FBG this AM in clinic was 160  Cardiovascular risk reduction: ? Current hypertensive regimen:lisinopril 20/hctz 25mg  daily ? Current hyperlipidemia regimen:atorvastatin 40(LDL on 10/24/20) ? Current antiplatelet regimen:ASA 81  Patient denieshypoglycemic events.  Patient-reported exercise habits:n/a  O:  Lab Results  Component Value Date   HGBA1C 6.0 10/24/2020   Lipid Panel     Component Value Date/Time   CHOL 127 10/24/2020 1017   CHOL 144 11/18/2012 0844   TRIG 90 10/24/2020 1017   TRIG 142 01/03/2016 0800   TRIG 161 (H) 11/18/2012 0844   HDL 33 (L) 10/24/2020 1017   HDL 31 (L) 01/03/2016 0800   HDL 27 (L) 11/18/2012 0844   CHOLHDL 3.8 10/24/2020 1017   LDLCALC 77 10/24/2020 1017   LDLCALC 45 03/08/2014 0810   LDLCALC 85 11/18/2012 0844     Home fasting blood sugars: 100s  2 hour post-meal/random blood sugars: <130.   A/P:  Diabetes T2DM currently controlled. Patient denies hypogylcemia.  Instructed patient to call if  BG<80 or >200.  We may be able to decrease metformin.  -Continue metformin GFR 50  -Continue glyxambi 10/5  -Information given to patient about [facility]  -Extensively discussed pathophysiology of diabetes, recommended lifestyle interventions, dietary effects on blood sugar control  -Counseled on s/sx of and management of hypoglycemia  -Next A1C anticipated 6-12 months.    Written patient instructions provided.  Total time in face to face counseling 25 minutes.    Regina Eck, PharmD, BCPS Clinical Pharmacist, [place]  [facility] Phone [phone]

## 2020-11-15 NOTE — Patient Instructions (Signed)
Visit Information  PATIENT GOALS: Goals Addressed            This Visit's Progress   . Take Care of My Teeth   Not on track    Timeframe:  Short-Term Goal Priority:  High Start Date: 11/15/20                            Expected End Date:  01/15/21                      Follow Up Date: 11/29/20   . brush teeth twice a day . floss teeth every day . use fluoride toothpaste  . [facility] regarding [facility] care . Talk with [facility] Care Guides to obtain information on dental clinics  . Schedule appointment with a dentist from the list provided by the [facility] Care Guides . Call CCM Team as needed   Why is this important?   Good mouth and tooth care can help stop bad breath, tooth decay and gum disease.  It also can help you keep your teeth as you get older.    Notes:        Follow Up Plan:  . Telephone follow up appointment with care management team member scheduled for: 11/29/20 with RNCM  Chong Sicilian, BSN, RN-BC [place] / [place] Management Direct Dial: [phone]

## 2020-11-15 NOTE — Telephone Encounter (Signed)
  Chronic Care Management   Care Coordination Note  11/15/2020 Name: Andrew [name] MRN: [id number] DOB: [birth year]  Consulted by Lottie Dawson, PharmD today regarding patient's need to find affordable dental care. Discussed dental concerns and placed a referral to [facility] Care Guides asking for assistance in finding resources for patient.   Follow up plan: The patient has been provided with contact information for the care management team and has been advised to call with any health related questions or concerns.  The care management team will reach out to the patient again over the next 30 days.   Chong Sicilian, BSN, RN-BC Embedded Chronic Care Manager [facility] / [place] Management Direct Dial: [phone]

## 2020-11-16 ENCOUNTER — Telehealth: Payer: Self-pay | Admitting: *Deleted

## 2020-11-16 NOTE — Telephone Encounter (Signed)
   Telephone encounter was:  Unsuccessful.  11/16/2020 Name: Andrew [name] MRN: [id number] DOB: [birth year]  Unsuccessful outbound call made today to assist with:  Financial Difficulties related to dental  Outreach Attempt:  1st Attempt  A HIPAA compliant voice message was left requesting a return call.  Instructed patient to call back at   Instructed patient to call back at [phone]  at their earliest convenience.   [place], Care Management  [phone] [address] , [place] [address] Email : [email or web address]

## 2020-11-16 NOTE — Telephone Encounter (Signed)
   Telephone encounter was:  Successful.  11/16/2020 Name: FAREED FUNG MRN: [id number] DOB: Jun 24, 1946  DONALD MEMOLI is a 75 y.o. year old male who is a primary care patient of Stacks, Cletus Gash, MD . The community resource team was consulted for assistance with Patient received information on [facility]  And was going to reach out   Care guide performed the following interventions: Patient provided with information about care guide support team and interviewed to confirm resource needs.  Follow Up Plan:  No further follow up planned at this time. The patient has been provided with needed resources.  [place], Care Management  [phone] [address] , [place] [address] Email : [email or web address]

## 2020-11-29 ENCOUNTER — Ambulatory Visit (INDEPENDENT_AMBULATORY_CARE_PROVIDER_SITE_OTHER): Payer: Medicare Other | Admitting: *Deleted

## 2020-11-29 DIAGNOSIS — I10 Essential (primary) hypertension: Secondary | ICD-10-CM

## 2020-11-29 DIAGNOSIS — E119 Type 2 diabetes mellitus without complications: Secondary | ICD-10-CM | POA: Diagnosis not present

## 2020-11-29 NOTE — Chronic Care Management (AMB) (Signed)
Chronic Care Management   CCM RN Visit Note  11/29/2020 Name: Andrew [name] MRN: [id number] DOB: 03-21-1946  Subjective: Andrew [name] is a 75 y.o. year old male who is a primary care patient of Stacks, Cletus Gash, MD. The care management team was consulted for assistance with disease management and care coordination needs.    Engaged with patient by telephone for follow up visit in response to provider referral for case management and/or care coordination services.   Consent to Services:  The patient was given information about Chronic Care Management services, agreed to services, and gave verbal consent prior to initiation of services.  Please see initial visit note for detailed documentation.   Patient agreed to services and verbal consent obtained.   Assessment: Review of patient past medical history, allergies, medications, health status, including review of consultants reports, laboratory and other test data, was performed as part of comprehensive evaluation and provision of chronic care management services.   SDOH (Social Determinants of Health) assessments and interventions performed:    CCM Care Plan  No Known Allergies  Outpatient Encounter Medications as of 11/29/2020  Medication Sig Note  . alfuzosin (UROXATRAL) 10 MG 24 hr tablet Take 1 tablet (10 mg total) by mouth at bedtime.   Marland Kitchen aspirin 81 MG tablet Take 81 mg by mouth daily.   Marland Kitchen atorvastatin (LIPITOR) 40 MG tablet TAKE 1 TABLET BY MOUTH  DAILY   . cholecalciferol (VITAMIN D) 1000 UNITS tablet Take 1,000 Units by mouth daily.    . Cinnamon 500 MG capsule Take 500 mg by mouth daily as needed (blood glucose levels).   . clotrimazole-betamethasone (LOTRISONE) cream Apply 1 application topically 2 (two) times daily.   . Empagliflozin-linaGLIPtin (GLYXAMBI) 10-5 MG TABS Take 1 tablet by mouth daily. 11/07/2019: #21 tabs [id number] A EXP 3/22  . enzalutamide (XTANDI) 80 MG tablet Take 2 tablets (160 mg total) by mouth daily.   Marland Kitchen  glucose blood test strip 1 each by Other route as needed for other. Use as instructed to test blood sugar daily.  One Touch Verio Test Strips DX: E11.65 10/17/2019: One Touch Verio  . lisinopril-hydrochlorothiazide (ZESTORETIC) 20-25 MG tablet TAKE 1 TABLET BY MOUTH  DAILY   . metFORMIN (GLUCOPHAGE) 1000 MG tablet TAKE 1 TABLET BY MOUTH  TWICE DAILY WITH MEALS   . nabumetone (RELAFEN) 500 MG tablet Take 2 tablets (1,000 mg total) by mouth 2 (two) times daily. For muscle and joint pain   . sildenafil (VIAGRA) 25 MG tablet Take 25 mg by mouth daily as needed for erectile dysfunction.    . tadalafil (CIALIS) 20 MG tablet Take 20 mg by mouth daily as needed for erectile dysfunction.    . tamsulosin (FLOMAX) 0.4 MG CAPS capsule Take 1 capsule (0.4 mg total) by mouth daily.   . traZODone (DESYREL) 150 MG tablet Use from 1/3 to 1 tablet nightly as needed for sleep.    No facility-administered encounter medications on file as of 11/29/2020.    Patient Active Problem List   Diagnosis Date Noted  . Primary insomnia 07/26/2020  . Sacroiliitis ([place]) 07/26/2020  . Nocturia 02/08/2020  . Nonspecific abnormal electrocardiogram (ECG) (EKG) 07/18/2019  . Type 2 diabetes mellitus with complication, without long-term current use of insulin ([place]) 07/18/2019  . Educated about COVID-19 virus infection 07/18/2019  . SOB (shortness of breath) 12/10/2016  . Erectile dysfunction due to arterial insufficiency 10/30/2016  . Polyp of colon 10/30/2016  . Peripheral vascular insufficiency ([place]) 06/13/2016  . Vitamin  D deficiency 08/24/2015  . Prostate cancer metastatic to intrapelvic lymph node ([place]) 04/07/2014  . Diabetes mellitus type 2, uncontrolled ([place]) 09/14/2010  . Benign prostatic hyperplasia with urinary obstruction 09/14/2010  . Essential hypertension 09/14/2010  . Hyperlipidemia associated with type 2 diabetes mellitus ([place]) 09/14/2010  . Tobacco abuse 09/14/2010    Conditions to be  addressed/monitored:HTN, DMII and poor oral health  Care Plan : RNCM: Oral Health  Updates made by Ilean China, RN since 11/29/2020 12:00 AM    Problem: Oral Health (Wellness)   Priority: High    Goal: 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  . Conditions: HTN, DM, HLD, prostate CA  Nurse Case Manager Clinical Goal(s):  . patient will work with [facility] to address needs related to affordable dental care . patient will meet with RN Care Manager to address oral health concerns  Interventions:  . 1:1 collaboration with [name], MD regarding development and update of comprehensive plan of care as evidenced by provider attestation and co-signature . Inter-disciplinary care team collaboration (see longitudinal plan of care) . Reviewed and discussed medications . Previously Consulted by Lottie Dawson, PharmD regarding patient's dental needs . Previously placed referral to [place] who contacted the patient and provided him with information on the [facility] o Patient has not reached out to the clinic as of today . Encouraged patient to reach out to them and to see what type of services they offer . Discussed his current oral health  o Has missing, loose and painful teeth o Goal is to have them removed and get dentures . Discussed affect of oral health on food intake o Able to eat soft foods. Has difficulty with hard or chewy foods o Not impacting his ability to eat enough nutritious meals . Verbal education provided on the effect of poor oral health on overall health . Discussed good oral hygiene practices . Provided with RN Care Manager contact number and encouraged to reach out as needed  Self Care Activities:  . Attends all  scheduled provider appointments . Calls provider office for new concerns or questions  Patient Goals Over the next 30 days, patient will: . brush teeth twice a day . floss teeth every day . use fluoride toothpaste  . Reach out to [facility] Dept regarding dental care . Talk with [facility] Care Guides to obtain information on dental clinics  . Call RN Care Manager as needed [phone]    Care Plan : RNCM: Diabetes Type 2 (Adult)  Updates made by Ilean China, RN since 11/29/2020 12:00 AM    Problem: Glycemic Management (Diabetes, Type 2)   Priority: Medium    Goal: Glycemic Management Optimized   Start Date: 11/29/2020  This Visit's Progress: On track  Priority: Medium  Note:   Objective: Lab Results  Component Value Date   HGBA1C 6.0 10/24/2020   HGBA1C 6.4 07/26/2020   HGBA1C 6.1 04/10/2020   Lab Results  Component Value Date   MICROALBUR 100 11/21/2014   LDLCALC 77 10/24/2020   CREATININE 1.47 (H) 10/24/2020   Current Barriers:  . Chronic Disease Management support and education needs related to diabetes in a patient with hypertension and poor oral health . Does not drive  Nurse Case  Manager Clinical Goal(s):  . patient will work with PCP and PharmD to address needs related to medical management of diabetes . patient will meet with RN Care Manager to address self-management of diabetes . the patient will demonstrate ongoing self health care management ability as evidenced by checking and recording blood sugar twice daily and by calling PCP or PharmD with any readings outside of recommended range*  Interventions:  . 1:1 collaboration with [name], MD regarding development and update of comprehensive plan of care as evidenced by provider attestation and co-signature . Inter-disciplinary care team collaboration (see longitudinal plan of care) . Chart reviewed including relevant office notes, correspondence notes, lab results, and imaging  reports . Evaluation of current treatment plan related to diabetes and patient's adherence to plan as established by provider. . Reviewed medications with patient and discussed importance of medication adherence . Discussed recent office visit with PharmD and reviewed recent A1C . Provided praise and encouragement for maintaining an A1C of less than 7 . Encouraged patient to continue checking and recording blood sugar twice a day and to call PCP or PharmD with any readings outside of recommended range . Reviewed diet . Discussed plans with patient for ongoing care management follow up and provided patient with direct contact information for care management team  Self Care Activities:  . Self administers medications as prescribed . Attends all scheduled provider appointments . Performs ADL's independently . Performs IADL's independently . Calls provider office for new concerns or questions  Patient Goals Over the next 45 days, patient will: . Take medication as prescribed . Keep all medical appointments . Check and record blood sugar twice a day . Call PCP or Pharm with any readings outside of recommended range [phone] . Call RN Care Manager as needed [phone] . Follow an ADA/carb modified diet    Care Plan : Hypertension (Adult)  Updates made by Ilean China, RN since 11/29/2020 12:00 AM    Problem: Hypertension (Hypertension)   Priority: Medium    Long-Range Goal: Hypertension Monitored   Start Date: 11/29/2020  This Visit's Progress: On track  Priority: Medium  Note:   Current Barriers:  . Chronic Disease Management support and education needs related to hypertension in a patient with diabetes and poor oral health . Unable to independently drive  Nurse Case Manager Clinical Goal(s):  . patient will work with PCP to address needs related to medical management of hypertension . patient will meet with RN Care Manager to address self-management of hypertension . the  patient will demonstrate ongoing self health care management ability as evidenced by checking and recording blood pressure at least 3 times per week and by calling PCP with any readings outside of recommended range*  Interventions:  . 1:1 collaboration with [name], MD regarding development and update of comprehensive plan of care as evidenced by provider attestation and co-signature . Inter-disciplinary care team collaboration (see longitudinal plan of care) . Chart reviewed including relevant office notes, correspondence notes, lab results, and imaging reports . Evaluation of current treatment plan related to hypertension and patient's adherence to plan as established by provider. . Reviewed medications with patient and discussed importance of medication adherence . Encouraged patient to check and record blood pressure at least 3 times per week and to call PCP with any readings outside of recommended range . Verbal education provided on low sodium/DASH diet . Discussed plans with patient for ongoing care management follow up and provided patient with direct contact information for  care management team  Self Care Activities:  . Self administers medications as prescribed . Calls pharmacy for medication refills . Attends church or other social activities . Performs ADL's independently . Performs IADL's independently . Calls provider office for new concerns or questions  Patient Goals Over the next 45 days, patient will: . Check and record blood pressure at least 3 times per week . Call PCP with any readings outside of recommended range [phone] . Take medication as directed . Keep all medical appointments . Read food labels for sodium content and do not add extra salt to food . Call RN Care Manager as needed [phone]    Follow Up Plan:  . Telephone follow up appointment with care management team member scheduled for: 01/14/21 with RNCM . The patient has been provided with  contact information for the care management team and has been advised to call with any health related questions or concerns.   Chong Sicilian, BSN, RN-BC Embedded Chronic Care Manager [facility] / [place] Management Direct Dial: [phone]

## 2020-11-29 NOTE — Patient Instructions (Signed)
Visit Information  PATIENT GOALS: Goals Addressed            This Visit's Progress   . Monitor and Manage My Blood Sugar-Diabetes Type 2   On track    Timeframe:  Long-Range Goal Priority:  Medium Start Date:   11/29/20                          Expected End Date:  11/29/21                      Follow Up Date 01/14/21    . Take medication as prescribed . Keep all medical appointments . Check and record blood sugar twice a day . Call PCP or Pharm with any readings outside of recommended range [phone] . Call RN Care Manager as needed [phone] . Follow an ADA/carb modified diet   Why is this important?    Checking your blood sugar at home helps to keep it from getting very high or very low.   Writing the results in a diary or log helps the doctor know how to care for you.   Your blood sugar log should have the time, date and the results.   Also, write down the amount of insulin or other medicine that you take.   Other information, like what you ate, exercise done and how you were feeling, will also be helpful.     Notes:     . Take Care of My Teeth   Not on track    Timeframe:  Short-Term Goal Priority:  High Start Date: 11/15/20                            Expected End Date:  01/15/21                      Follow Up Date: 01/14/21   . brush teeth twice a day . floss teeth every day . use fluoride toothpaste  . Reach out to [facility] regarding dental care . Talk with [facility] to obtain information on dental clinics  . Call RN Care Manager as needed [phone]   Why is this important?   Good mouth and tooth care can help stop bad breath, tooth decay and gum disease.  It also can help you keep your teeth as you get older.    Notes:     . Track and Manage My Blood Pressure-Hypertension       Timeframe:  Long-Range Goal Priority:  Medium Start Date:  11/29/20                           Expected End Date:  11/29/21                       Follow Up Date 01/14/21    . Check and record blood pressure at least 3 times per week . Call PCP with any readings outside of recommended range [phone] . Take medication as directed . Keep all medical appointments . Read food labels for sodium content and do not add extra salt to food . Call RN Care Manager as needed [phone]   Why is this important?    You won't feel high blood pressure, but it can still hurt your blood vessels.   High blood pressure  can cause heart or kidney problems. It can also cause a stroke.   Making lifestyle changes like losing a little weight or eating less salt will help.   Checking your blood pressure at home and at different times of the day can help to control blood pressure.   If the doctor prescribes medicine remember to take it the way the doctor ordered.   Call the office if you cannot afford the medicine or if there are questions about it.     Notes:        Patient verbalizes understanding of instructions provided today and agrees to view in [place].   Follow Up Plan:  . Telephone follow up appointment with care management team member scheduled for: 01/14/21 with RNCM . The patient has been provided with contact information for the care management team and has been advised to call with any health related questions or concerns.   Chong Sicilian, BSN, RN-BC Embedded Chronic Care Manager [facility] [place] [facility] Direct Dial: [phone]

## 2021-01-11 ENCOUNTER — Other Ambulatory Visit: Payer: Medicare Other

## 2021-01-14 ENCOUNTER — Telehealth: Payer: Medicare Other | Admitting: *Deleted

## 2021-01-14 ENCOUNTER — Telehealth: Payer: Self-pay | Admitting: *Deleted

## 2021-01-14 NOTE — Telephone Encounter (Signed)
  Care Management   Follow Up Note   01/14/2021 Name: Andrew [name] MRN: [id number] DOB: 1945-09-17   Referred by: [name], MD Reason for referral : Chronic Care Management (Unsuccessful outreach)   An unsuccessful telephone outreach was attempted today. The patient was referred to the case management team for assistance with care management and care coordination.   Follow Up Plan: A HIPPA compliant phone message was left for the patient providing contact information and requesting a return call.  Forwarding to [facility] for outreach and rescheduling.   Chong Sicilian, BSN, RN-BC Embedded Chronic Care Manager [facility] [place] [facility] Direct Dial: [phone]

## 2021-01-16 ENCOUNTER — Ambulatory Visit (INDEPENDENT_AMBULATORY_CARE_PROVIDER_SITE_OTHER): Payer: Medicare Other | Admitting: Family Medicine

## 2021-01-16 ENCOUNTER — Ambulatory Visit (INDEPENDENT_AMBULATORY_CARE_PROVIDER_SITE_OTHER): Payer: Medicare Other

## 2021-01-16 ENCOUNTER — Encounter: Payer: Self-pay | Admitting: Family Medicine

## 2021-01-16 ENCOUNTER — Other Ambulatory Visit: Payer: Self-pay

## 2021-01-16 VITALS — BP 119/68 | HR 80 | Temp 97.1°F | Ht 68.0 in | Wt 209.4 lb

## 2021-01-16 DIAGNOSIS — M62830 Muscle spasm of back: Secondary | ICD-10-CM | POA: Diagnosis not present

## 2021-01-16 MED ORDER — METHYLPREDNISOLONE 8 MG PO TABS: 32.0000 mg | ORAL_TABLET | Freq: Every day | ORAL | 0 refills | Status: AC

## 2021-01-16 MED ORDER — TIZANIDINE HCL 6 MG PO CAPS: 6.0000 mg | ORAL_CAPSULE | Freq: Three times a day (TID) | ORAL | 1 refills | Status: DC | PRN

## 2021-01-16 NOTE — Progress Notes (Signed)
Subjective:  Patient ID: Andrew [name], male    DOB: [birth year]  Age: 75 y.o. MRN: [id number]  CC: Back Pain   HPI Andrew [name] presents for left lower lmbar, all over pain. NKI. Just flares every once in a while. Off and on since last visit.  Pain is moderately severe.  He cannot describe them on a 1-10 scale today.  The pain has been getting worse over the last week or 2.   Depression screen [facility] 2/9 01/16/2021 01/16/2021 10/24/2020  Decreased Interest 0 0 0  Down, Depressed, Hopeless 0 0 0  PHQ - 2 Score 0 0 0  Altered sleeping 0 - -  Tired, decreased energy 2 - -  Change in appetite 0 - -  Feeling bad or failure about yourself  0 - -  Trouble concentrating 1 - -  Moving slowly or fidgety/restless 1 - -  Suicidal thoughts 0 - -  PHQ-9 Score 4 - -  Difficult doing work/chores Not difficult at all - -  Some recent data might be hidden    History Andrew [name] has a past medical history of Anemia, BPH (benign prostatic hyperplasia), Cataract, Diabetes mellitus without complication ([place]), Hyperlipidemia, Hypertension, Low serum vitamin D, and Prostate cancer ([place]) (2008).   He has a past surgical history that includes Prostate surgery (2008) and Colonoscopy (4315,4008).   His family history includes Colon polyps in his brother; Deep vein thrombosis in his sister; Diabetes in his mother; Emphysema in his father; Gout in his brother; Heart disease in his mother; Hypertension in his brother; Kidney disease in his mother and sister; Lung cancer in his sister.He reports that he has been smoking cigarettes. He started smoking about 55 years ago. He has a 55.00 pack-year smoking history. He has never used smokeless tobacco. He reports that he does not drink alcohol and does not use drugs.    ROS Review of Systems  Constitutional:  Negative for chills, diaphoresis and fever.  HENT:  Negative for sore throat.   Respiratory:  Negative for shortness of breath.   Cardiovascular:  Negative for chest  pain.  Gastrointestinal:  Negative for abdominal pain.  Musculoskeletal:  Positive for arthralgias, back pain, gait problem and myalgias. Negative for neck pain.  Skin:  Negative for rash.  Neurological:  Positive for weakness. Negative for numbness.   Objective:  BP 119/68   Pulse 80   Temp (!) 97.1 F (36.2 C)   Ht 5\' 8"  (1.727 m)   Wt 209 lb 6.4 oz (95 kg)   SpO2 97%   BMI 31.84 kg/m   BP Readings from Last 3 Encounters:  01/16/21 119/68  10/24/20 130/79  07/26/20 115/66    Wt Readings from Last 3 Encounters:  01/16/21 209 lb 6.4 oz (95 kg)  10/24/20 215 lb 12.8 oz (97.9 kg)  07/26/20 215 lb 1.6 oz (97.6 kg)     Physical Exam Vitals reviewed.  Constitutional:      General: He is in acute distress.     Appearance: He is well-developed.  HENT:     Head: Normocephalic.  Eyes:     Pupils: Pupils are equal, round, and reactive to light.  Cardiovascular:     Rate and Rhythm: Normal rate and regular rhythm.     Heart sounds: Normal heart sounds. No murmur heard. Pulmonary:     Effort: Pulmonary effort is normal.     Breath sounds: Normal breath sounds.  Abdominal:     Tenderness: There  is no abdominal tenderness.  Musculoskeletal:        General: Tenderness present.     Cervical back: Normal range of motion.  Skin:    General: Skin is warm and dry.  Neurological:     Mental Status: He is alert and oriented to person, place, and time.     Deep Tendon Reflexes: Reflexes are normal and symmetric.  Psychiatric:        Behavior: Behavior normal.        Thought Content: Thought content normal.      Assessment & Plan:   Andrew [name] was seen today for back pain.  Diagnoses and all orders for this visit:  Lumbar paraspinal muscle spasm -     DG Lumbar Spine 2-3 Views; Future  Other orders -     methylPREDNISolone (MEDROL) 8 MG tablet; Take 4 tablets (32 mg total) by mouth daily for 7 days. -     tizanidine (ZANAFLEX) 6 MG capsule; Take 1 capsule (6 mg total) by  mouth 3 (three) times daily as needed for muscle spasms.      I have discontinued Andrew [name] nabumetone. I am also having him start on methylPREDNISolone and tizanidine. Additionally, I am having him maintain his aspirin, cholecalciferol, Cinnamon, glucose blood, tadalafil, sildenafil, Glyxambi, clotrimazole-betamethasone, alfuzosin, enzalutamide, traZODone, tamsulosin, lisinopril-hydrochlorothiazide, metFORMIN, and atorvastatin.  Allergies as of 01/16/2021   No Known Allergies      Medication List        Accurate as of January 16, 2021  6:32 PM. If you have any questions, ask your nurse or doctor.          STOP taking these medications    nabumetone 500 MG tablet Commonly known as: RELAFEN Stopped by: [name], MD       TAKE these medications    alfuzosin 10 MG 24 hr tablet Commonly known as: UROXATRAL Take 1 tablet (10 mg total) by mouth at bedtime.   aspirin 81 MG tablet Take 81 mg by mouth daily.   atorvastatin 40 MG tablet Commonly known as: LIPITOR TAKE 1 TABLET BY MOUTH  DAILY   cholecalciferol 1000 units tablet Commonly known as: VITAMIN D Take 1,000 Units by mouth daily.   Cinnamon 500 MG capsule Take 500 mg by mouth daily as needed (blood glucose levels).   clotrimazole-betamethasone cream Commonly known as: Lotrisone Apply 1 application topically 2 (two) times daily.   enzalutamide 80 MG tablet Commonly known as: Xtandi Take 2 tablets (160 mg total) by mouth daily.   glucose blood test strip 1 each by Other route as needed for other. Use as instructed to test blood sugar daily.  One Touch Verio Test Strips DX: E11.65   Glyxambi 10-5 MG Tabs Generic drug: Empagliflozin-linaGLIPtin Take 1 tablet by mouth daily.   lisinopril-hydrochlorothiazide 20-25 MG tablet Commonly known as: ZESTORETIC TAKE 1 TABLET BY MOUTH  DAILY   metFORMIN 1000 MG tablet Commonly known as: GLUCOPHAGE TAKE 1 TABLET BY MOUTH  TWICE DAILY WITH MEALS    methylPREDNISolone 8 MG tablet Commonly known as: MEDROL Take 4 tablets (32 mg total) by mouth daily for 7 days. Started by: [name], MD   sildenafil 25 MG tablet Commonly known as: VIAGRA Take 25 mg by mouth daily as needed for erectile dysfunction.   tadalafil 20 MG tablet Commonly known as: CIALIS Take 20 mg by mouth daily as needed for erectile dysfunction.   tamsulosin 0.4 MG Caps capsule Commonly known as: FLOMAX Take 1 capsule (  0.4 mg total) by mouth daily.   tizanidine 6 MG capsule Commonly known as: ZANAFLEX Take 1 capsule (6 mg total) by mouth 3 (three) times daily as needed for muscle spasms. Started by: [name], MD   traZODone 150 MG tablet Commonly known as: DESYREL Use from 1/3 to 1 tablet nightly as needed for sleep.       Back exercise handout dispensed.  Patient encouraged to follow the exercises twice daily.  Follow-up: Return in about 1 month (around 02/16/2021).  [name], M.D.

## 2021-01-16 NOTE — Patient Instructions (Signed)
Back Exercises The following exercises strengthen the muscles that help to support the trunk and back. They also help to keep the lower back flexible. Doing these exercises can help to prevent back pain or lessen existing pain. If you have back pain or discomfort, try doing these exercises 2-3 times each day or as told by your health care provider. As your pain improves, do them once each day, but increase the number of times that you repeat the steps for each exercise (do more repetitions). To prevent the recurrence of back pain, continue to do these exercises once each day or as told by your health care provider. Do exercises exactly as told by your health care provider and adjust them as directed. It is normal to feel mild stretching, pulling, tightness, or discomfort as you do these exercises, but you should stop right away if youfeel sudden pain or your pain gets worse. Exercises Single knee to chest Repeat these steps 3-5 times for each leg: Lie on your back on a firm bed or the floor with your legs extended. Bring one knee to your chest. Your other leg should stay extended and in contact with the floor. Hold your knee in place by grabbing your knee or thigh with both hands and hold. Pull on your knee until you feel a gentle stretch in your lower back or buttocks. Hold the stretch for 10-30 seconds. Slowly release and straighten your leg. Pelvic tilt Repeat these steps 5-10 times: Lie on your back on a firm bed or the floor with your legs extended. Bend your knees so they are pointing toward the ceiling and your feet are flat on the floor. Tighten your lower abdominal muscles to press your lower back against the floor. This motion will tilt your pelvis so your tailbone points up toward the ceiling instead of pointing to your feet or the floor. With gentle tension and even breathing, hold this position for 5-10 seconds. Cat-cow Repeat these steps until your lower back becomes more  flexible: Get into a hands-and-knees position on a firm surface. Keep your hands under your shoulders, and keep your knees under your hips. You may place padding under your knees for comfort. Let your head hang down toward your chest. Contract your abdominal muscles and point your tailbone toward the floor so your lower back becomes rounded like the back of a cat. Hold this position for 5 seconds. Slowly lift your head, let your abdominal muscles relax and point your tailbone up toward the ceiling so your back forms a sagging arch like the back of a cow. Hold this position for 5 seconds.  Press-ups Repeat these steps 5-10 times: Lie on your abdomen (face-down) on the floor. Place your palms near your head, about shoulder-width apart. Keeping your back as relaxed as possible and keeping your hips on the floor, slowly straighten your arms to raise the top half of your body and lift your shoulders. Do not use your back muscles to raise your upper torso. You may adjust the placement of your hands to make yourself more comfortable. Hold this position for 5 seconds while you keep your back relaxed. Slowly return to lying flat on the floor.  Bridges Repeat these steps 10 times: Lie on your back on a firm surface. Bend your knees so they are pointing toward the ceiling and your feet are flat on the floor. Your arms should be flat at your sides, next to your body. Tighten your buttocks muscles and lift your   buttocks off the floor until your waist is at almost the same height as your knees. You should feel the muscles working in your buttocks and the back of your thighs. If you do not feel these muscles, slide your feet 1-2 inches farther away from your buttocks. Hold this position for 3-5 seconds. Slowly lower your hips to the starting position, and allow your buttocks muscles to relax completely. If this exercise is too easy, try doing it with your arms crossed over yourchest. Abdominal  crunches Repeat these steps 5-10 times: Lie on your back on a firm bed or the floor with your legs extended. Bend your knees so they are pointing toward the ceiling and your feet are flat on the floor. Cross your arms over your chest. Tip your chin slightly toward your chest without bending your neck. Tighten your abdominal muscles and slowly raise your trunk (torso) high enough to lift your shoulder blades a tiny bit off the floor. Avoid raising your torso higher than that because it can put too much stress on your low back and does not help to strengthen your abdominal muscles. Slowly return to your starting position. Back lifts Repeat these steps 5-10 times: Lie on your abdomen (face-down) with your arms at your sides, and rest your forehead on the floor. Tighten the muscles in your legs and your buttocks. Slowly lift your chest off the floor while you keep your hips pressed to the floor. Keep the back of your head in line with the curve in your back. Your eyes should be looking at the floor. Hold this position for 3-5 seconds. Slowly return to your starting position. Contact a health care provider if: Your back pain or discomfort gets much worse when you do an exercise. Your worsening back pain or discomfort does not lessen within 2 hours after you exercise. If you have any of these problems, stop doing these exercises right away. Do not do them again unless your health care provider says that you can. Get help right away if: You develop sudden, severe back pain. If this happens, stop doing the exercises right away. Do not do them again unless your health care provider says that you can. This information is not intended to replace advice given to you by your health care provider. Make sure you discuss any questions you have with your healthcare provider. Document Revised: 10/21/2018 Document Reviewed: 03/18/2018 Elsevier Patient Education  [place].

## 2021-01-18 ENCOUNTER — Ambulatory Visit: Payer: Medicare Other | Admitting: Urology

## 2021-01-18 ENCOUNTER — Encounter: Payer: Self-pay | Admitting: Family Medicine

## 2021-01-18 DIAGNOSIS — I700 Atherosclerosis of aorta: Secondary | ICD-10-CM | POA: Insufficient documentation

## 2021-01-24 ENCOUNTER — Ambulatory Visit (INDEPENDENT_AMBULATORY_CARE_PROVIDER_SITE_OTHER): Payer: Medicare Other

## 2021-01-24 VITALS — Ht 68.0 in | Wt 209.0 lb

## 2021-01-24 DIAGNOSIS — Z720 Tobacco use: Secondary | ICD-10-CM

## 2021-01-24 DIAGNOSIS — F17210 Nicotine dependence, cigarettes, uncomplicated: Secondary | ICD-10-CM | POA: Diagnosis not present

## 2021-01-24 DIAGNOSIS — Z0000 Encounter for general adult medical examination without abnormal findings: Secondary | ICD-10-CM

## 2021-01-24 NOTE — Progress Notes (Signed)
Subjective:   Andrew [name] is a 75 y.o. male who presents for Medicare Annual/Subsequent preventive examination.  Virtual Visit via Telephone Note  I connected with  Andrew [name] on 01/24/21 at  9:45 AM EDT by telephone and verified that I am speaking with the correct person using two identifiers.  Location: Patient: Home Provider: [facility] Persons participating in the virtual visit: patient and his wife, Andrew [name]   I discussed the limitations, risks, security and privacy concerns of performing an evaluation and management service by telephone and the availability of in person appointments. The patient expressed understanding and agreed to proceed.  Interactive audio and video telecommunications were attempted between this nurse and patient, however failed, due to patient having technical difficulties OR patient did not have access to video capability.  We continued and completed visit with audio only.  Some vital signs may be absent or patient reported.   Andrew [name] E Anthonella Klausner, LPN   Review of Systems     Cardiac Risk Factors include: advanced age (>52mn, >>94women);diabetes mellitus;dyslipidemia;hypertension;male gender;obesity (BMI >30kg/m2);sedentary lifestyle;smoking/ tobacco exposure     Objective:    Today's Vitals   01/24/21 0949  Weight: 209 lb (94.8 kg)  Height: '5\' 8"'$  (1.727 m)   Body mass index is 31.78 kg/m.  Advanced Directives 01/24/2021 01/24/2020 01/20/2019 01/11/2018 07/02/2017 01/05/2017 09/08/2014  Does Patient Have a Medical Advance Directive? No No No No No No No  Would patient like information on creating a medical advance directive? Yes (MAU/Ambulatory/Procedural Areas - Information given) No - Patient declined No - Patient declined Yes (MAU/Ambulatory/Procedural Areas - Information given) - Yes (MAU/Ambulatory/Procedural Areas - Information given) No - patient declined information    Current Medications (verified) Outpatient Encounter Medications as  of 01/24/2021  Medication Sig   alfuzosin (UROXATRAL) 10 MG 24 hr tablet Take 1 tablet (10 mg total) by mouth at bedtime.   aspirin 81 MG tablet Take 81 mg by mouth daily.   atorvastatin (LIPITOR) 40 MG tablet TAKE 1 TABLET BY MOUTH  DAILY   cholecalciferol (VITAMIN D) 1000 UNITS tablet Take 1,000 Units by mouth daily.    Cinnamon 500 MG capsule Take 500 mg by mouth daily as needed (blood glucose levels).   clotrimazole-betamethasone (LOTRISONE) cream Apply 1 application topically 2 (two) times daily.   Empagliflozin-linaGLIPtin (GLYXAMBI) 10-5 MG TABS Take 1 tablet by mouth daily.   enzalutamide (XTANDI) 80 MG tablet Take 2 tablets (160 mg total) by mouth daily.   glucose blood test strip 1 each by Other route as needed for other. Use as instructed to test blood sugar daily.  One Touch Verio Test Strips DX: E11.65   lisinopril-hydrochlorothiazide (ZESTORETIC) 20-25 MG tablet TAKE 1 TABLET BY MOUTH  DAILY   metFORMIN (GLUCOPHAGE) 1000 MG tablet TAKE 1 TABLET BY MOUTH  TWICE DAILY WITH MEALS   methylPREDNISolone (MEDROL) 4 MG tablet Take 32 mg by mouth daily.   sildenafil (VIAGRA) 25 MG tablet Take 25 mg by mouth daily as needed for erectile dysfunction.    tadalafil (CIALIS) 20 MG tablet Take 20 mg by mouth daily as needed for erectile dysfunction.    tamsulosin (FLOMAX) 0.4 MG CAPS capsule Take 1 capsule (0.4 mg total) by mouth daily.   tizanidine (ZANAFLEX) 6 MG capsule Take 1 capsule (6 mg total) by mouth 3 (three) times daily as needed for muscle spasms.   traZODone (DESYREL) 150 MG tablet Use from 1/3 to 1 tablet nightly as needed for sleep.  No facility-administered encounter medications on file as of 01/24/2021.    Allergies (verified) Patient has no known allergies.   History: Past Medical History:  Diagnosis Date   Anemia    BPH (benign prostatic hyperplasia)    Sees Dr [name]   Cataract    Diabetes mellitus without complication ([place])    Hyperlipidemia    Hypertension     Low serum vitamin D    Prostate cancer ([place]) 2008   w/seed implantation and radiation   Past Surgical History:  Procedure Laterality Date   COLONOSCOPY  2015,2018   PROSTATE SURGERY  2008   seed implant   Family History  Problem Relation Age of Onset   Diabetes Mother    Heart disease Mother    [name] disease Mother        DIALYSIS   Emphysema Father    Deep vein thrombosis Sister    [name] disease Sister    Lung cancer Sister    Colon polyps Brother    Hypertension Brother    Gout Brother    Colon cancer Neg Hx    Esophageal cancer Neg Hx    Rectal cancer Neg Hx    Prostate cancer Neg Hx    Social History   Socioeconomic History   Marital status: Married    Spouse name: Holdrege   Number of children: 2   Years of education: 12   Highest education level: 12th grade  Occupational History   Occupation: [name]    Comment: Retired Geologist, engineering  Tobacco Use   Smoking status: Every Day    Packs/day: 1.00    Years: 55.00    Pack years: 55.00    Types: Cigarettes    Start date: 06/30/1965   Smokeless tobacco: Never   Tobacco comments:    Has tried nicotine replacement gum  Vaping Use   Vaping Use: Never used  Substance and Sexual Activity   Alcohol use: No   Drug use: No   Sexual activity: Yes  Other Topics Concern   Not on file  Social History Narrative   Retired, lives at home with wife Andrew [name], two grown children, enjoys gardening     Social Determinants of Radio broadcast assistant Strain: Low Risk    Difficulty of Paying Living Expenses: Not hard at all  Food Insecurity: No Food Insecurity   Worried About Charity fundraiser in the Last Year: Never true   Arboriculturist in the Last Year: Never true  Transportation Needs: No Transportation Needs   Lack of Transportation (Medical): No   Lack of Transportation (Non-Medical): No  Physical Activity: Inactive   Days of Exercise per Week: 0 days   Minutes of Exercise per Session: 0 min  Stress: No  Stress Concern Present   Feeling of Stress : Not at all  Social Connections: Socially Integrated   Frequency of Communication with Friends and Family: More than three times a week   Frequency of Social Gatherings with Friends and Family: More than three times a week   Attends Religious Services: More than 4 times per year   Active Member of [employer] or Organizations: Yes   Attends Music therapist: More than 4 times per year   Marital Status: Married    Tobacco Counseling Ready to quit: Not Answered Counseling given: Not Answered Tobacco comments: Has tried nicotine replacement gum   Clinical Intake:  Pre-visit preparation completed: Yes  Pain : No/denies pain  BMI - recorded: 31.78 Nutritional Status: BMI > 30  Obese Nutritional Risks: None Diabetes: Yes CBG done?: No Did pt. bring in CBG monitor from home?: No  How often do you need to have someone help you when you read instructions, pamphlets, or other written materials from your doctor or pharmacy?: 1 - Never  Nutrition Risk Assessment:  Has the patient had any N/V/D within the last 2 months?  No  Does the patient have any non-healing wounds?  No  Has the patient had any unintentional weight loss or weight gain?  No   Diabetes:  Is the patient diabetic?  Yes  If diabetic, was a CBG obtained today?  No  Did the patient bring in their glucometer from home?  No  How often do you monitor your CBG's? 3x per week.   Financial Strains and Diabetes Management:  Are you having any financial strains with the device, your supplies or your medication? No .  Does the patient want to be seen by Chronic Care Management for management of their diabetes?  No  Would the patient like to be referred to a Nutritionist or for Diabetic Management?  No   Diabetic Exams:  Diabetic Eye Exam: Completed 2022.  Diabetic Foot Exam: Completed 10/24/2020. Pt has been advised about the importance in completing this exam. Pt  is scheduled for diabetic foot exam on 01/29/21.    Interpreter Needed?: No  Information entered by :: Tallon Gertz, LPN   Activities of Daily Living In your present state of health, do you have any difficulty performing the following activities: 01/24/2021  Hearing? N  Vision? N  Difficulty concentrating or making decisions? N  Walking or climbing stairs? Y  Dressing or bathing? N  Doing errands, shopping? Y  Comment he doesn't Physiological scientist and eating ? N  Using the Toilet? N  In the past six months, have you accidently leaked urine? N  Do you have problems with loss of bowel control? N  Managing your Medications? N  Managing your Finances? N  Housekeeping or managing your Housekeeping? N  Some recent data might be hidden    Patient Care Team: [name], MD as PCP - General (Family Medicine) Minus Breeding, MD as Consulting Physician (Cardiology) Alyson Ingles Candee Furbish, MD as Consulting Physician (Urology) McKenzie, Candee Furbish, MD as Consulting Physician (Urology) Ilean China, RN as Case Manager P[place], Royce Macadamia, [facility] (Pharmacist)  Indicate any recent Medical Services you may have received from other than [facility] providers in the past year (date may be approximate).     Assessment:   This is a routine wellness examination for Jahsier.  Hearing/Vision screen Hearing Screening - Comments:: Denies hearing difficulties  Vision Screening - Comments:: Wears reading glasses only - up to date with annual eye exam with Dr Anthony Sar in [place]  Dietary issues and exercise activities discussed: Current Exercise Habits: The patient does not participate in regular exercise at present, Exercise limited by: None identified   Goals Addressed             This Visit's Progress    Exercise 3x per week (30 min per time)   Not on track    Continue with exercising 3 times a week for at least 30 min each time      Have 3 meals a day   On track      Depression Screen PHQ 2/9  Scores 01/24/2021 01/16/2021 01/16/2021 10/24/2020 07/26/2020 04/10/2020 01/24/2020  PHQ -  2 Score 0 0 0 0 0 0 0  PHQ- 9 Score 4 4 - - - - -    Fall Risk Fall Risk  01/24/2021 01/16/2021 10/24/2020 07/26/2020 04/10/2020  Falls in the past year? 0 0 0 0 0  Number falls in past yr: 0 - - - 0  Injury with Fall? 0 - - - 0  Risk for fall due to : No Fall Risks - - No Fall Risks No Fall Risks  Risk for fall due to: Comment - - - - -  Follow up Falls prevention discussed - - Falls evaluation completed Falls evaluation completed  Comment - - - - -    FALL RISK PREVENTION PERTAINING TO THE HOME:  Any stairs in or around the home? Yes  If so, are there any without handrails? No  Home free of loose throw rugs in walkways, pet beds, electrical cords, etc? Yes  Adequate lighting in your home to reduce risk of falls? Yes   ASSISTIVE DEVICES UTILIZED TO PREVENT FALLS:  Life alert? No  Use of a cane, walker or w/c? No  Grab bars in the bathroom? No  Shower chair or bench in shower? No  Elevated toilet seat or a handicapped toilet? No   TIMED UP AND GO:  Was the test performed? No . Telephonic visit  Cognitive Function: MMSE - Mini Mental State Exam 01/11/2018 01/05/2017  Orientation to time 5 4  Orientation to Place 4 5  Registration 3 3  Attention/ Calculation 5 3  Recall 3 3  Language- name 2 objects 2 2  Language- repeat 1 1  Language- follow 3 step command 3 3  Language- read & follow direction 1 1  Write a sentence 1 1  Copy design 1 1  Total score 29 27     6CIT Screen 01/24/2021 01/24/2020 01/20/2019  What Year? 0 points 0 points 0 points  What month? 0 points 0 points 0 points  What time? 0 points 0 points 0 points  Count back from 20 0 points 0 points 0 points  Months in reverse 0 points 0 points 0 points  Repeat phrase 0 points 2 points 2 points  Total Score 0 2 2    Immunizations Immunization History  Administered Date(s) Administered   Fluad Quad(high Dose 65+) 04/06/2019,  04/10/2020   Influenza, High Dose Seasonal PF 05/13/2016, 04/14/2017, 04/06/2018   Influenza,inj,Quad PF,6+ Mos 04/18/2013, 04/07/2014, 04/06/2015   Moderna SARS-COV2 Booster Vaccination 06/06/2020, 11/21/2020   Moderna Sars-Covid-2 Vaccination 08/22/2019, 09/19/2019   Pneumococcal Conjugate-13 02/27/2014   Pneumococcal Polysaccharide-23 11/16/2012    TDAP status: Due, Education has been provided regarding the importance of this vaccine. Advised may receive this vaccine at local pharmacy or Health Dept. Aware to provide a copy of the vaccination record if obtained from local pharmacy or Health Dept. Verbalized acceptance and understanding.  Flu Vaccine status: Up to date  Pneumococcal vaccine status: Up to date  Covid-19 vaccine status: Completed vaccines  Qualifies for Shingles Vaccine? Yes   Zostavax completed No   Shingrix Completed?: No.    Education has been provided regarding the importance of this vaccine. Patient has been advised to call insurance company to determine out of pocket expense if they have not yet received this vaccine. Advised may also receive vaccine at local pharmacy or Health Dept. Verbalized acceptance and understanding.  Screening Tests Health Maintenance  Topic Date Due   Hepatitis C Screening  Never done   Zoster Vaccines- Shingrix (  1 of 2) Never done   OPHTHALMOLOGY EXAM  05/30/2020   TETANUS/TDAP  10/24/2021 (Originally 10/26/1964)   INFLUENZA VACCINE  01/28/2021   COVID-19 Vaccine (5 - Booster for Moderna series) 03/24/2021   HEMOGLOBIN A1C  04/25/2021   FOOT EXAM  10/24/2021   COLONOSCOPY (Pts 45-27yr Insurance coverage will need to be confirmed)  11/01/2022   PNA vac Low Risk Adult  Completed   HPV VACCINES  Aged Out    Health Maintenance  Health Maintenance Due  Topic Date Due   Hepatitis C Screening  Never done   Zoster Vaccines- Shingrix (1 of 2) Never done   OPHTHALMOLOGY EXAM  05/30/2020    Colorectal cancer screening: Type of  screening: Colonoscopy. Completed 11/01/2019. Repeat every 3 years  Lung Cancer Screening: (Low Dose CT Chest recommended if Age 75-80years, 30 pack-year currently smoking OR have quit w/in 15years.) does qualify.   Lung Cancer Screening Referral:  sent today  Additional Screening:  Hepatitis C Screening: does qualify;Due  Vision Screening: Recommended annual ophthalmology exams for early detection of glaucoma and other disorders of the eye. Is the patient up to date with their annual eye exam?  Yes  Who is the provider or what is the name of the office in which the patient attends annual eye exams? [facility] If pt is not established with a provider, would they like to be referred to a provider to establish care? No .   Dental Screening: Recommended annual dental exams for proper oral hygiene  Community Resource Referral / Chronic Care Management: CRR required this visit?  No   CCM required this visit?  No      Plan:     I have personally reviewed and noted the following in the patient's chart:   Medical and social history Use of alcohol, tobacco or illicit drugs  Current medications and supplements including opioid prescriptions. Patient is not currently taking opioid prescriptions. Functional ability and status Nutritional status Physical activity Advanced directives List of other physicians Hospitalizations, surgeries, and ER visits in previous 12 months Vitals Screenings to include cognitive, depression, and falls Referrals and appointments  In addition, I have reviewed and discussed with patient certain preventive protocols, quality metrics, and best practice recommendations. A written personalized care plan for preventive services as well as general preventive health recommendations were provided to patient.     ASandrea Hammond LPN   [id number]  Nurse Notes: He wants to discuss getting a medicine to help  him stop smoking.

## 2021-01-24 NOTE — Patient Instructions (Signed)
Andrew [name] , Thank you for taking time to come for your Medicare Wellness Visit. I appreciate your ongoing commitment to your health goals. Please review the following plan we discussed and let me know if I can assist you in the future.   Screening recommendations/referrals: Colonoscopy: Done 11/01/2019 - Repeat in 3 years Recommended yearly ophthalmology/optometry visit for glaucoma screening and checkup Recommended yearly dental visit for hygiene and checkup  Vaccinations: Influenza vaccine: Done 04/10/2020 - Repeat annually  Pneumococcal vaccine: Done 11/16/2012 & 02/27/2014 Tdap vaccine: Due (every 10 years) Shingles vaccine: Due. Shingrix discussed. Please contact your pharmacy for coverage information.     Covid-19: Done 08/01/2019, 08/29/2019, 12//2021, & 11/21/2020  Advanced directives: Advance directive discussed with you today. I have provided a copy for you to complete at home and have notarized. Once this is complete please bring a copy in to our office so we can scan it into your chart.   Conditions/risks identified: Aim for 30 minutes of exercise or brisk walking each day, drink 6-8 glasses of water and eat lots of fruits and vegetables.   Next appointment: Follow up in one year for your annual wellness visit.   Preventive Care 27 Years and Older, Male  Preventive care refers to lifestyle choices and visits with your health care provider that can promote health and wellness. What does preventive care include? A yearly physical exam. This is also called an annual well check. Dental exams once or twice a year. Routine eye exams. Ask your health care provider how often you should have your eyes checked. Personal lifestyle choices, including: Daily care of your teeth and gums. Regular physical activity. Eating a healthy diet. Avoiding tobacco and drug use. Limiting alcohol use. Practicing safe sex. Taking low doses of aspirin every day. Taking vitamin and mineral supplements as  recommended by your health care provider. What happens during an annual well check? The services and screenings done by your health care provider during your annual well check will depend on your age, overall health, lifestyle risk factors, and family history of disease. Counseling  Your health care provider may ask you questions about your: Alcohol use. Tobacco use. Drug use. Emotional well-being. Home and relationship well-being. Sexual activity. Eating habits. History of falls. Memory and ability to understand (cognition). Work and work Statistician. Screening  You may have the following tests or measurements: Height, weight, and BMI. Blood pressure. Lipid and cholesterol levels. These may be checked every 5 years, or more frequently if you are over 88 years old. Skin check. Lung cancer screening. You may have this screening every year starting at age 30 if you have a 30-pack-year history of smoking and currently smoke or have quit within the past 15 years. Fecal occult blood test (FOBT) of the stool. You may have this test every year starting at age 5. Flexible sigmoidoscopy or colonoscopy. You may have a sigmoidoscopy every 5 years or a colonoscopy every 10 years starting at age 56. Prostate cancer screening. Recommendations will vary depending on your family history and other risks. Hepatitis C blood test. Hepatitis B blood test. Sexually transmitted disease (STD) testing. Diabetes screening. This is done by checking your blood sugar (glucose) after you have not eaten for a while (fasting). You may have this done every 1-3 years. Abdominal aortic aneurysm (AAA) screening. You may need this if you are a current or former smoker. Osteoporosis. You may be screened starting at age 54 if you are at high risk. Talk with your  health care provider about your test results, treatment options, and if necessary, the need for more tests. Vaccines  Your health care provider may recommend  certain vaccines, such as: Influenza vaccine. This is recommended every year. Tetanus, diphtheria, and acellular pertussis (Tdap, Td) vaccine. You may need a Td booster every 10 years. Zoster vaccine. You may need this after age 26. Pneumococcal 13-valent conjugate (PCV13) vaccine. One dose is recommended after age 26. Pneumococcal polysaccharide (PPSV23) vaccine. One dose is recommended after age 73. Talk to your health care provider about which screenings and vaccines you need and how often you need them. This information is not intended to replace advice given to you by your health care provider. Make sure you discuss any questions you have with your health care provider. Document Released: 07/13/2015 Document Revised: 03/05/2016 Document Reviewed: 04/17/2015 Elsevier Interactive Patient Education  2017 [place] Prevention in the Home Falls can cause injuries. They can happen to people of all ages. There are many things you can do to make your home safe and to help prevent falls. What can I do on the outside of my home? Regularly fix the edges of walkways and driveways and fix any cracks. Remove anything that might make you trip as you walk through a door, such as a raised step or threshold. Trim any bushes or trees on the path to your home. Use bright outdoor lighting. Clear any walking paths of anything that might make someone trip, such as rocks or tools. Regularly check to see if handrails are loose or broken. Make sure that both sides of any steps have handrails. Any raised decks and porches should have guardrails on the edges. Have any leaves, snow, or ice cleared regularly. Use sand or salt on walking paths during winter. Clean up any spills in your garage right away. This includes oil or grease spills. What can I do in the bathroom? Use night lights. Install grab bars by the toilet and in the tub and shower. Do not use towel bars as grab bars. Use non-skid mats or  decals in the tub or shower. If you need to sit down in the shower, use a plastic, non-slip stool. Keep the floor dry. Clean up any water that spills on the floor as soon as it happens. Remove soap buildup in the tub or shower regularly. Attach bath mats securely with double-sided non-slip rug tape. Do not have throw rugs and other things on the floor that can make you trip. What can I do in the bedroom? Use night lights. Make sure that you have a light by your bed that is easy to reach. Do not use any sheets or blankets that are too big for your bed. They should not hang down onto the floor. Have a firm chair that has side arms. You can use this for support while you get dressed. Do not have throw rugs and other things on the floor that can make you trip. What can I do in the kitchen? Clean up any spills right away. Avoid walking on wet floors. Keep items that you use a lot in easy-to-reach places. If you need to reach something above you, use a strong step stool that has a grab bar. Keep electrical cords out of the way. Do not use floor polish or wax that makes floors slippery. If you must use wax, use non-skid floor wax. Do not have throw rugs and other things on the floor that can make you trip. What  can I do with my stairs? Do not leave any items on the stairs. Make sure that there are handrails on both sides of the stairs and use them. Fix handrails that are broken or loose. Make sure that handrails are as long as the stairways. Check any carpeting to make sure that it is firmly attached to the stairs. Fix any carpet that is loose or worn. Avoid having throw rugs at the top or bottom of the stairs. If you do have throw rugs, attach them to the floor with carpet tape. Make sure that you have a light switch at the top of the stairs and the bottom of the stairs. If you do not have them, ask someone to add them for you. What else can I do to help prevent falls? Wear shoes that: Do not  have high heels. Have rubber bottoms. Are comfortable and fit you well. Are closed at the toe. Do not wear sandals. If you use a stepladder: Make sure that it is fully opened. Do not climb a closed stepladder. Make sure that both sides of the stepladder are locked into place. Ask someone to hold it for you, if possible. Clearly mark and make sure that you can see: Any grab bars or handrails. First and last steps. Where the edge of each step is. Use tools that help you move around (mobility aids) if they are needed. These include: Canes. Walkers. Scooters. Crutches. Turn on the lights when you go into a dark area. Replace any light bulbs as soon as they burn out. Set up your furniture so you have a clear path. Avoid moving your furniture around. If any of your floors are uneven, fix them. If there are any pets around you, be aware of where they are. Review your medicines with your doctor. Some medicines can make you feel dizzy. This can increase your chance of falling. Ask your doctor what other things that you can do to help prevent falls. This information is not intended to replace advice given to you by your health care provider. Make sure you discuss any questions you have with your health care provider. Document Released: 04/12/2009 Document Revised: 11/22/2015 Document Reviewed: 07/21/2014 Elsevier Interactive Patient Education  2017 [employer] Inc[employer]  

## 2021-01-29 ENCOUNTER — Encounter: Payer: Self-pay | Admitting: Family Medicine

## 2021-01-29 ENCOUNTER — Other Ambulatory Visit: Payer: Self-pay

## 2021-01-29 ENCOUNTER — Ambulatory Visit (INDEPENDENT_AMBULATORY_CARE_PROVIDER_SITE_OTHER): Payer: Medicare Other | Admitting: Family Medicine

## 2021-01-29 VITALS — BP 122/74 | HR 74 | Temp 97.7°F | Ht 68.0 in | Wt 207.2 lb

## 2021-01-29 DIAGNOSIS — E119 Type 2 diabetes mellitus without complications: Secondary | ICD-10-CM | POA: Diagnosis not present

## 2021-01-29 DIAGNOSIS — I10 Essential (primary) hypertension: Secondary | ICD-10-CM

## 2021-01-29 DIAGNOSIS — E1165 Type 2 diabetes mellitus with hyperglycemia: Secondary | ICD-10-CM

## 2021-01-29 DIAGNOSIS — E1169 Type 2 diabetes mellitus with other specified complication: Secondary | ICD-10-CM

## 2021-01-29 DIAGNOSIS — E785 Hyperlipidemia, unspecified: Secondary | ICD-10-CM

## 2021-01-29 LAB — CMP14+EGFR
ALT: 11 IU/L (ref 0–44)
AST: 19 IU/L (ref 0–40)
Albumin/Globulin Ratio: 1.5 (ref 1.2–2.2)
Albumin: 3.8 g/dL (ref 3.7–4.7)
Alkaline Phosphatase: 89 IU/L (ref 44–121)
BUN/Creatinine Ratio: 13 (ref 10–24)
BUN: 19 mg/dL (ref 8–27)
Bilirubin Total: 0.5 mg/dL (ref 0.0–1.2)
CO2: 23 mmol/L (ref 20–29)
Calcium: 9.1 mg/dL (ref 8.6–10.2)
Chloride: 100 mmol/L (ref 96–106)
Creatinine, Ser: 1.5 mg/dL — ABNORMAL HIGH (ref 0.76–1.27)
Globulin, Total: 2.6 g/dL (ref 1.5–4.5)
Glucose: 111 mg/dL — ABNORMAL HIGH (ref 65–99)
Potassium: 4.1 mmol/L (ref 3.5–5.2)
Sodium: 137 mmol/L (ref 134–144)
Total Protein: 6.4 g/dL (ref 6.0–8.5)
eGFR: 48 mL/min/{1.73_m2} — ABNORMAL LOW (ref >59)

## 2021-01-29 LAB — CBC WITH DIFFERENTIAL/PLATELET
Basophils Absolute: 0 10*3/uL (ref 0.0–0.2)
Basos: 1 %
EOS (ABSOLUTE): 0.3 10*3/uL (ref 0.0–0.4)
Eos: 4 %
Hematocrit: 35.8 % — ABNORMAL LOW (ref 37.5–51.0)
Hemoglobin: 11.9 g/dL — ABNORMAL LOW (ref 13.0–17.7)
Immature Grans (Abs): 0 10*3/uL (ref 0.0–0.1)
Immature Granulocytes: 1 %
Lymphocytes Absolute: 2 10*3/uL (ref 0.7–3.1)
Lymphs: 30 %
MCH: 29.4 pg (ref 26.6–33.0)
MCHC: 33.2 g/dL (ref 31.5–35.7)
MCV: 88 fL (ref 79–97)
Monocytes Absolute: 0.6 10*3/uL (ref 0.1–0.9)
Monocytes: 9 %
Neutrophils Absolute: 3.7 10*3/uL (ref 1.4–7.0)
Neutrophils: 55 %
Platelets: 199 10*3/uL (ref 150–450)
RBC: 4.05 x10E6/uL — ABNORMAL LOW (ref 4.14–5.80)
RDW: 13.2 % (ref 11.6–15.4)
WBC: 6.6 10*3/uL (ref 3.4–10.8)

## 2021-01-29 LAB — LIPID PANEL
Chol/HDL Ratio: 3.7 ratio (ref 0.0–5.0)
Cholesterol, Total: 128 mg/dL (ref 100–199)
HDL: 35 mg/dL — ABNORMAL LOW (ref >39)
LDL Chol Calc (NIH): 68 mg/dL (ref 0–99)
Triglycerides: 140 mg/dL (ref 0–149)
VLDL Cholesterol Cal: 25 mg/dL (ref 5–40)

## 2021-01-29 LAB — BAYER DCA HB A1C WAIVED: HB A1C (BAYER DCA - WAIVED): 6.1 % (ref <7.0)

## 2021-01-29 MED ORDER — ATORVASTATIN CALCIUM 40 MG PO TABS: 40.0000 mg | ORAL_TABLET | Freq: Every day | ORAL | 3 refills | Status: DC

## 2021-01-29 MED ORDER — LISINOPRIL-HYDROCHLOROTHIAZIDE 20-25 MG PO TABS: 1.0000 | ORAL_TABLET | Freq: Every day | ORAL | 3 refills | Status: DC

## 2021-01-29 MED ORDER — METFORMIN HCL 1000 MG PO TABS: 1000.0000 mg | ORAL_TABLET | Freq: Two times a day (BID) | ORAL | 3 refills | Status: DC

## 2021-01-29 NOTE — Progress Notes (Signed)
Subjective:  Patient ID: Andrew [name],  male    DOB: 1945/09/21  Age: 75 y.o.    CC: Medical Management of Chronic Issues   HPI Andrew [name] presents for  follow-up of hypertension. Patient has no history of headache chest pain or shortness of breath or recent cough. Patient also denies symptoms of TIA such as numbness weakness lateralizing. Patient denies side effects from medication. States taking it regularly.  Patient also  in for follow-up of elevated cholesterol. Doing well without complaints on current medication. Denies side effects  including myalgia and arthralgia and nausea. Also in today for liver function testing. Currently no chest pain, shortness of breath or other cardiovascular related symptoms noted.  Follow-up of diabetes. Patient does check blood sugar at home. Readings run a little over 100 Patient denies symptoms such as excessive hunger or urinary frequency, excessive hunger, nausea No significant hypoglycemic spells noted. Medications reviewed. Pt reports taking them regularly. Pt. denies complication/adverse reaction today.     History Andrew [name] has a past medical history of Anemia, BPH (benign prostatic hyperplasia), Cataract, Diabetes mellitus without complication ([place]), Hyperlipidemia, Hypertension, Low serum vitamin D, and Prostate cancer ([place]) (2008).   He has a past surgical history that includes Prostate surgery (2008) and Colonoscopy ([id number]).   His family history includes Colon polyps in his brother; Deep vein thrombosis in his sister; Diabetes in his mother; Emphysema in his father; Gout in his brother; Heart disease in his mother; Hypertension in his brother; Kidney disease in his mother and sister; Lung cancer in his sister.He reports that he has been smoking cigarettes. He started smoking about 55 years ago. He has a 55.00 pack-year smoking history. He has never used smokeless tobacco. He reports that he does not drink alcohol and does not use  drugs.  Current Outpatient Medications on File Prior to Visit  Medication Sig Dispense Refill   alfuzosin (UROXATRAL) 10 MG 24 hr tablet Take 1 tablet (10 mg total) by mouth at bedtime. 30 tablet 11   aspirin 81 MG tablet Take 81 mg by mouth daily.     cholecalciferol (VITAMIN D) 1000 UNITS tablet Take 1,000 Units by mouth daily.      Cinnamon 500 MG capsule Take 500 mg by mouth daily as needed (blood glucose levels).     clotrimazole-betamethasone (LOTRISONE) cream Apply 1 application topically 2 (two) times daily. 30 g 0   Empagliflozin-linaGLIPtin (GLYXAMBI) 10-5 MG TABS Take 1 tablet by mouth daily.     enzalutamide (XTANDI) 80 MG tablet Take 2 tablets (160 mg total) by mouth daily. 60 tablet 11   glucose blood test strip 1 each by Other route as needed for other. Use as instructed to test blood sugar daily.  One Touch Verio Test Strips DX: E11.65 100 each 11   sildenafil (VIAGRA) 25 MG tablet Take 25 mg by mouth daily as needed for erectile dysfunction.      tadalafil (CIALIS) 20 MG tablet Take 20 mg by mouth daily as needed for erectile dysfunction.      tamsulosin (FLOMAX) 0.4 MG CAPS capsule Take 1 capsule (0.4 mg total) by mouth daily. 90 capsule 3   tizanidine (ZANAFLEX) 6 MG capsule Take 1 capsule (6 mg total) by mouth 3 (three) times daily as needed for muscle spasms. 90 capsule 1   traZODone (DESYREL) 150 MG tablet Use from 1/3 to 1 tablet nightly as needed for sleep. 90 tablet 3   No current facility-administered medications on file prior  to visit.    ROS Review of Systems  Constitutional:  Negative for fever.  Respiratory:  Negative for shortness of breath.   Cardiovascular:  Negative for chest pain.  Genitourinary:  Negative for difficulty urinating, dysuria and frequency.  Musculoskeletal:  Positive for back pain (getting better. Doing exercise routine "sometimes."). Negative for arthralgias.  Skin:  Negative for rash.  Objective:  BP 122/74   Pulse 74   Temp 97.7 F  (36.5 C)   Ht '5\' 8"'  (1.727 m)   Wt 207 lb 3.2 oz (94 kg)   SpO2 96%   BMI 31.50 kg/m   BP Readings from Last 3 Encounters:  01/29/21 122/74  01/16/21 119/68  10/24/20 130/79    Wt Readings from Last 3 Encounters:  01/29/21 207 lb 3.2 oz (94 kg)  01/24/21 209 lb (94.8 kg)  01/16/21 209 lb 6.4 oz (95 kg)     Physical Exam Vitals reviewed.  Constitutional:      Appearance: He is well-developed.  HENT:     Head: Normocephalic and atraumatic.     Right Ear: External ear normal.     Left Ear: External ear normal.     Mouth/Throat:     Pharynx: No oropharyngeal exudate or posterior oropharyngeal erythema.  Eyes:     Pupils: Pupils are equal, round, and reactive to light.  Cardiovascular:     Rate and Rhythm: Normal rate and regular rhythm.     Heart sounds: No murmur heard. Pulmonary:     Effort: No respiratory distress.     Breath sounds: Normal breath sounds.  Musculoskeletal:     Cervical back: Normal range of motion and neck supple.  Neurological:     Mental Status: He is alert and oriented to person, place, and time.    Diabetic Foot Exam - Simple   No data filed       Assessment & Plan:   Andrew [name] was seen today for medical management of chronic issues.  Diagnoses and all orders for this visit:  Type 2 diabetes mellitus without complication, without long-term current use of insulin (HCC) -     Bayer DCA Hb A1c Waived -     CBC with Differential/Platelet -     CMP14+EGFR  Essential hypertension -     CMP14+EGFR -     lisinopril-hydrochlorothiazide (ZESTORETIC) 20-25 MG tablet; Take 1 tablet by mouth daily.  Hyperlipidemia associated with type 2 diabetes mellitus (HCC) -     Lipid panel -     atorvastatin (LIPITOR) 40 MG tablet; Take 1 tablet (40 mg total) by mouth daily.  Uncontrolled type 2 diabetes mellitus with hyperglycemia (HCC) -     metFORMIN (GLUCOPHAGE) 1000 MG tablet; Take 1 tablet (1,000 mg total) by mouth 2 (two) times daily with a  meal.  I have discontinued Jannifer Franklin. Kimura's methylPREDNISolone. I have also changed his atorvastatin, lisinopril-hydrochlorothiazide, and metFORMIN. Additionally, I am having him maintain his aspirin, cholecalciferol, Cinnamon, glucose blood, tadalafil, sildenafil, Glyxambi, clotrimazole-betamethasone, alfuzosin, enzalutamide, traZODone, tamsulosin, and tizanidine.  Meds ordered this encounter  Medications   atorvastatin (LIPITOR) 40 MG tablet    Sig: Take 1 tablet (40 mg total) by mouth daily.    Dispense:  90 tablet    Refill:  3   lisinopril-hydrochlorothiazide (ZESTORETIC) 20-25 MG tablet    Sig: Take 1 tablet by mouth daily.    Dispense:  90 tablet    Refill:  3   metFORMIN (GLUCOPHAGE) 1000 MG tablet  Sig: Take 1 tablet (1,000 mg total) by mouth 2 (two) times daily with a meal.    Dispense:  180 tablet    Refill:  3     Follow-up: No follow-ups on file.  [name], M.D.

## 2021-02-01 NOTE — Progress Notes (Signed)
Hello Andrew [name],  Your lab result is normal and/or stable.Some minor variations that are not significant are commonly marked abnormal, but do not represent any medical problem for you.  Best regards, [name], M.D.

## 2021-02-04 ENCOUNTER — Other Ambulatory Visit: Payer: Medicare Other

## 2021-02-13 ENCOUNTER — Other Ambulatory Visit: Payer: Self-pay | Admitting: Family Medicine

## 2021-02-13 ENCOUNTER — Ambulatory Visit: Payer: Medicare Other | Admitting: Urology

## 2021-02-13 DIAGNOSIS — I10 Essential (primary) hypertension: Secondary | ICD-10-CM

## 2021-02-13 DIAGNOSIS — E785 Hyperlipidemia, unspecified: Secondary | ICD-10-CM

## 2021-02-13 DIAGNOSIS — E1169 Type 2 diabetes mellitus with other specified complication: Secondary | ICD-10-CM

## 2021-02-18 ENCOUNTER — Ambulatory Visit (INDEPENDENT_AMBULATORY_CARE_PROVIDER_SITE_OTHER): Payer: Medicare Other | Admitting: Family Medicine

## 2021-02-18 ENCOUNTER — Encounter: Payer: Self-pay | Admitting: Family Medicine

## 2021-02-18 DIAGNOSIS — U071 COVID-19: Secondary | ICD-10-CM

## 2021-02-18 MED ORDER — MOLNUPIRAVIR EUA 200MG CAPSULE: 4.0000 | ORAL_CAPSULE | Freq: Two times a day (BID) | ORAL | 0 refills | Status: AC

## 2021-02-18 NOTE — Progress Notes (Signed)
Subjective:    Patient ID: Andrew [name], male    DOB: 03-19-1946, 75 y.o.   MRN: [id number]   HPI: Andrew [name] is a 75 y.o. male presenting for covid infection. Feels weak, has no energy. Poor appetite. Covid test was positive yesterday. Denies fever. Coughs every once in a while. Denies dyspnea. Onset about 5-7 days ago.    Depression screen [facility] 2/9 01/29/2021 01/29/2021 01/24/2021 01/16/2021 01/16/2021  Decreased Interest 0 0 0 0 0  Down, Depressed, Hopeless 1 0 0 0 0  PHQ - 2 Score 1 0 0 0 0  Altered sleeping 0 - 0 0 -  Tired, decreased energy 2 - 2 2 -  Change in appetite 1 - 0 0 -  Feeling bad or failure about yourself  1 - 0 0 -  Trouble concentrating 1 - 1 1 -  Moving slowly or fidgety/restless 0 - 1 1 -  Suicidal thoughts 0 - 0 0 -  PHQ-9 Score 6 - 4 4 -  Difficult doing work/chores Somewhat difficult - Somewhat difficult Not difficult at all -  Some recent data might be hidden     Relevant past medical, surgical, family and social history reviewed and updated as indicated.  Interim medical history since our last visit reviewed. Allergies and medications reviewed and updated.  ROS:  Review of Systems  Constitutional:  Negative for fever.  Respiratory:  Negative for shortness of breath.   Cardiovascular:  Negative for chest pain.  Musculoskeletal:  Negative for arthralgias.  Skin:  Negative for rash.    Social History   Tobacco Use  Smoking Status Every Day   Packs/day: 1.00   Years: 55.00   Pack years: 55.00   Types: Cigarettes   Start date: 06/30/1965  Smokeless Tobacco Never  Tobacco Comments   Has tried nicotine replacement gum       Objective:     Wt Readings from Last 3 Encounters:  01/29/21 207 lb 3.2 oz (94 kg)  01/24/21 209 lb (94.8 kg)  01/16/21 209 lb 6.4 oz (95 kg)     Exam deferred. Pt. Harboring due to COVID 19. Phone visit performed.   Assessment & Plan:   1. COVID-19 virus infection     Meds ordered this encounter  Medications    molnupiravir EUA 200 mg CAPS    Sig: Take 4 capsules (800 mg total) by mouth 2 (two) times daily for 5 days.    Dispense:  40 capsule    Refill:  0     No orders of the defined types were placed in this encounter.     Diagnoses and all orders for this visit:  COVID-19 virus infection  Other orders -     molnupiravir EUA 200 mg CAPS; Take 4 capsules (800 mg total) by mouth 2 (two) times daily for 5 days.   Virtual Visit via telephone Note  I discussed the limitations, risks, security and privacy concerns of performing an evaluation and management service by telephone and the availability of in person appointments. The patient was identified with two identifiers. Pt.expressed understanding and agreed to proceed. Pt. Is at home. Dr. Livia Snellen is in his office.  Follow Up Instructions:   I discussed the assessment and treatment plan with the patient. The patient was provided an opportunity to ask questions and all were answered. The patient agreed with the plan and demonstrated an understanding of the instructions.   The patient was advised to call back  or seek an in-person evaluation if the symptoms worsen or if the condition fails to improve as anticipated.   Total minutes including chart review and phone contact time: 12   Follow up plan: Return if symptoms worsen or fail to improve.  [name], MD [place]

## 2021-02-20 ENCOUNTER — Other Ambulatory Visit: Payer: Self-pay

## 2021-02-20 ENCOUNTER — Other Ambulatory Visit: Payer: Medicare Other

## 2021-02-20 DIAGNOSIS — C61 Malignant neoplasm of prostate: Secondary | ICD-10-CM

## 2021-02-20 DIAGNOSIS — F17210 Nicotine dependence, cigarettes, uncomplicated: Secondary | ICD-10-CM

## 2021-02-20 DIAGNOSIS — Z720 Tobacco use: Secondary | ICD-10-CM

## 2021-02-21 LAB — PSA: Prostate Specific Ag, Serum: 5.2 ng/mL — ABNORMAL HIGH (ref 0.0–4.0)

## 2021-02-22 ENCOUNTER — Ambulatory Visit (INDEPENDENT_AMBULATORY_CARE_PROVIDER_SITE_OTHER): Payer: Medicare Other | Admitting: *Deleted

## 2021-02-22 DIAGNOSIS — E119 Type 2 diabetes mellitus without complications: Secondary | ICD-10-CM

## 2021-02-22 DIAGNOSIS — I10 Essential (primary) hypertension: Secondary | ICD-10-CM

## 2021-02-27 ENCOUNTER — Other Ambulatory Visit: Payer: Self-pay

## 2021-02-27 ENCOUNTER — Ambulatory Visit: Payer: Medicare Other | Admitting: Urology

## 2021-02-27 ENCOUNTER — Encounter: Payer: Self-pay | Admitting: Urology

## 2021-02-27 VITALS — BP 112/67 | HR 80

## 2021-02-27 DIAGNOSIS — C775 Secondary and unspecified malignant neoplasm of intrapelvic lymph nodes: Secondary | ICD-10-CM | POA: Diagnosis not present

## 2021-02-27 DIAGNOSIS — C61 Malignant neoplasm of prostate: Secondary | ICD-10-CM | POA: Diagnosis not present

## 2021-02-27 DIAGNOSIS — N401 Enlarged prostate with lower urinary tract symptoms: Secondary | ICD-10-CM | POA: Diagnosis not present

## 2021-02-27 DIAGNOSIS — I10 Essential (primary) hypertension: Secondary | ICD-10-CM | POA: Diagnosis not present

## 2021-02-27 DIAGNOSIS — R351 Nocturia: Secondary | ICD-10-CM | POA: Diagnosis not present

## 2021-02-27 DIAGNOSIS — N138 Other obstructive and reflux uropathy: Secondary | ICD-10-CM

## 2021-02-27 DIAGNOSIS — E119 Type 2 diabetes mellitus without complications: Secondary | ICD-10-CM

## 2021-02-27 LAB — URINALYSIS, ROUTINE W REFLEX MICROSCOPIC
Bilirubin, UA: NEGATIVE
Ketones, UA: NEGATIVE
Leukocytes,UA: NEGATIVE
Nitrite, UA: NEGATIVE
Protein,UA: NEGATIVE
RBC, UA: NEGATIVE
Specific Gravity, UA: <1.005 — ABNORMAL LOW (ref 1.005–1.030)
Urobilinogen, Ur: 0.2 mg/dL (ref 0.2–1.0)
pH, UA: 6 (ref 5.0–7.5)

## 2021-02-27 MED ORDER — LEUPROLIDE ACETATE (6 MONTH) 45 MG ~~LOC~~ KIT
45.0000 mg | PACK | Freq: Once | SUBCUTANEOUS | Status: AC
  Administered 2021-02-27: 45 mg via SUBCUTANEOUS

## 2021-02-27 MED ORDER — ALFUZOSIN HCL ER 10 MG PO TB24: 10.0000 mg | ORAL_TABLET | Freq: Every day | ORAL | 11 refills | Status: DC

## 2021-02-27 NOTE — Progress Notes (Signed)
02/27/2021 3:21 PM   Andrew [name] 23-Apr-1946 [id number]  Referring provider: [name], MD [place],  [place] [id number]  Followup prostate cancer and BPH   HPI: [name] is a 75yo here for followup for Castrate resistant prostate cancer and BPH. PSA increased to 5.2 on xtandi and eligard. No new bone pain. Stable fatigue. IPSS 7 QOL 1 on uroxatral '10mg'$ . Urine stream strong. Nocturia 1-2x. No other complaints today   PMH: Past Medical History:  Diagnosis Date   Anemia    BPH (benign prostatic hyperplasia)    Sees Dr Andrew [name]   Cataract    Diabetes mellitus without complication ([place])    Hyperlipidemia    Hypertension    Low serum vitamin D    Prostate cancer (Andrew [name]) 2008   w/seed implantation and radiation    Surgical History: Past Surgical History:  Procedure Laterality Date   COLONOSCOPY  2015,2018   PROSTATE SURGERY  2008   seed implant    Home Medications:  Allergies as of 02/27/2021   No Known Allergies      Medication List        Accurate as of February 27, 2021  3:21 PM. If you have any questions, ask your nurse or doctor.          alfuzosin 10 MG 24 hr tablet Commonly known as: UROXATRAL Take 1 tablet (10 mg total) by mouth at bedtime.   aspirin 81 MG tablet Take 81 mg by mouth daily.   atorvastatin 40 MG tablet Commonly known as: LIPITOR TAKE 1 TABLET BY MOUTH  DAILY   cholecalciferol 1000 units tablet Commonly known as: VITAMIN D Take 1,000 Units by mouth daily.   Cinnamon 500 MG capsule Take 500 mg by mouth daily as needed (blood glucose levels).   clotrimazole-betamethasone cream Commonly known as: Lotrisone Apply 1 application topically 2 (two) times daily.   enzalutamide 80 MG tablet Commonly known as: Xtandi Take 2 tablets (160 mg total) by mouth daily.   glucose blood test strip 1 each by Other route as needed for other. Use as instructed to test blood sugar daily.  One Touch Verio Test Strips DX: E11.65    Glyxambi 10-5 MG Tabs Generic drug: Empagliflozin-linaGLIPtin Take 1 tablet by mouth daily.   lisinopril-hydrochlorothiazide 20-25 MG tablet Commonly known as: ZESTORETIC TAKE 1 TABLET BY MOUTH  DAILY   metFORMIN 1000 MG tablet Commonly known as: GLUCOPHAGE Take 1 tablet (1,000 mg total) by mouth 2 (two) times daily with a meal.   sildenafil 25 MG tablet Commonly known as: VIAGRA Take 25 mg by mouth daily as needed for erectile dysfunction.   tadalafil 20 MG tablet Commonly known as: CIALIS Take 20 mg by mouth daily as needed for erectile dysfunction.   tamsulosin 0.4 MG Caps capsule Commonly known as: FLOMAX Take 1 capsule (0.4 mg total) by mouth daily.   tizanidine 6 MG capsule Commonly known as: ZANAFLEX Take 1 capsule (6 mg total) by mouth 3 (three) times daily as needed for muscle spasms.   traZODone 150 MG tablet Commonly known as: DESYREL Use from 1/3 to 1 tablet nightly as needed for sleep.        Allergies: No Known Allergies  Family History: Family History  Problem Relation Age of Onset   Diabetes Mother    Heart disease Mother    Kidney disease Mother        DIALYSIS   Emphysema Father    Deep vein thrombosis  Sister    Kidney disease Sister    Lung cancer Sister    Colon polyps Brother    Hypertension Brother    Gout Brother    Colon cancer Neg Hx    Esophageal cancer Neg Hx    Rectal cancer Neg Hx    Prostate cancer Neg Hx     Social History:  reports that he has been smoking cigarettes. He started smoking about 55 years ago. He has a 55.00 pack-year smoking history. He has never used smokeless tobacco. He reports that he does not drink alcohol and does not use drugs.  ROS: All other review of systems were reviewed and are negative except what is noted above in HPI  Physical Exam: BP 112/67   Pulse 80   Constitutional:  Alert and oriented, No acute distress. HEENT: [place] AT, moist mucus membranes.  Trachea midline, no  masses. Cardiovascular: No clubbing, cyanosis, or edema. Respiratory: Normal respiratory effort, no increased work of breathing. GI: Abdomen is soft, nontender, nondistended, no abdominal masses GU: No CVA tenderness.  Lymph: No cervical or inguinal lymphadenopathy. Skin: No rashes, bruises or suspicious lesions. Neurologic: Grossly intact, no focal deficits, moving all 4 extremities. Psychiatric: Normal mood and affect.  Laboratory Data: Lab Results  Component Value Date   WBC 6.6 01/29/2021   HGB 11.9 (L) 01/29/2021   HCT 35.8 (L) 01/29/2021   MCV 88 01/29/2021   PLT 199 01/29/2021    Lab Results  Component Value Date   CREATININE 1.50 (H) 01/29/2021    Lab Results  Component Value Date   PSA 1.6 08/10/2019    Lab Results  Component Value Date   TESTOSTERONE <3 (L) 04/10/2020    Lab Results  Component Value Date   HGBA1C 6.1 01/29/2021    Urinalysis    Component Value Date/Time   COLORURINE YELLOW 07/02/2017 0410   APPEARANCEUR Clear 02/27/2021 1435   LABSPEC 1.018 07/02/2017 0410   PHURINE 7.0 07/02/2017 0410   GLUCOSEU 1+ (A) 02/27/2021 1435   HGBUR NEGATIVE 07/02/2017 0410   BILIRUBINUR Negative 02/27/2021 1435   KETONESUR NEGATIVE 07/02/2017 0410   PROTEINUR Negative 02/27/2021 1435   PROTEINUR NEGATIVE 07/02/2017 0410   UROBILINOGEN negative 07/19/2014 1002   NITRITE Negative 02/27/2021 1435   NITRITE NEGATIVE 07/02/2017 0410   LEUKOCYTESUR Negative 02/27/2021 1435    Lab Results  Component Value Date   LABMICR Comment 02/27/2021   WBCUA None seen 01/10/2020   RBCUA None seen 08/11/2018   LABEPIT None seen 01/10/2020   MUCUS neg 06/21/2013   BACTERIA None seen 01/10/2020    Pertinent Imaging:  No results found for this or any previous visit.  No results found for this or any previous visit.  No results found for this or any previous visit.  No results found for this or any previous visit.  No results found for this or any previous  visit.  No results found for this or any previous visit.  No results found for this or any previous visit.  No results found for this or any previous visit.   Assessment & Plan:    1. Prostate cancer metastatic to intrapelvic lymph node (HCC) -BMP, CT abd, Bone scan - Urinalysis, Routine w reflex microscopic - leuprolide (6 Month) (ELIGARD) injection 45 mg  2. Benign prostatic hyperplasia with urinary obstruction -Continue uroxatral '10mg'$  qhs  3. Nocturia Continue uroxatral '10mg'$  qhs   No follow-ups on file.  Nicolette Bang, MD  [facility] [place]

## 2021-02-27 NOTE — Progress Notes (Signed)
Urological Symptom Review  Patient is experiencing the following symptoms: Frequent urination Leakage of urine Stream starts and stops Have to strain to urinate   Review of Systems  Gastrointestinal (upper)  : Negative for upper GI symptoms  Gastrointestinal (lower) : Negative for lower GI symptoms  Constitutional : Negative for symptoms  Skin: Negative for skin symptoms  Eyes: Negative for eye symptoms  Ear/Nose/Throat : Negative for Ear/Nose/Throat symptoms  Hematologic/Lymphatic: Negative for Hematologic/Lymphatic symptoms  Cardiovascular : Negative for cardiovascular symptoms  Respiratory : Negative for respiratory symptoms  Endocrine: Negative for endocrine symptoms  Musculoskeletal: Back pain  Neurological: Negative for neurological symptoms  Psychologic: Negative for psychiatric symptoms   SubQ Injection  Patient is present today for a SubQ Injection for treatment of Prostate Cancer Drug: Eligard Dose: 45 Location: right hip Lot: [id number] Exp: 06/2022  Patient tolerated well, no complications were noted  Preformed by: Francie Keeling LPN  Follow up/ Additional Notes: Per MD note

## 2021-02-27 NOTE — Patient Instructions (Signed)
Benign Prostatic Hyperplasia Benign prostatic hyperplasia (BPH) is an enlarged prostate gland that is caused by the normal aging process and not by cancer. The prostate is a walnut-sized gland that is involved in the production of semen. It is located in front of the rectum and below the bladder. The bladder stores urine and the urethra is the tube that carries the urine out of the body. The prostate may get bigger as a man gets older. An enlarged prostate can press on the urethra. This can make it harder to pass urine. The build-up of urine in the bladder can cause infection. Back pressure and infection may progress to bladder damage and kidney (renal) failure. What are the causes? This condition is part of a normal aging process. However, not all men develop problems from this condition. If the prostate enlarges away from the urethra, urine flow will not be blocked. If it enlarges toward the urethra and compresses it, there will be problems passing urine. What increases the risk? This condition is more likely to develop in men over the age of 50 years. What are the signs or symptoms? Symptoms of this condition include: Getting up often during the night to urinate. Needing to urinate frequently during the day. Difficulty starting urine flow. Decrease in size and strength of your urine stream. Leaking (dribbling) after urinating. Inability to pass urine. This needs immediate treatment. Inability to completely empty your bladder. Pain when you pass urine. This is more common if there is also an infection. Urinary tract infection (UTI). How is this diagnosed? This condition is diagnosed based on your medical history, a physical exam, and your symptoms. Tests will also be done, such as: A post-void bladder scan. This measures any amount of urine that may remain in your bladder after you finish urinating. A digital rectal exam. In a rectal exam, your health care provider checks your prostate by  putting a lubricated, gloved finger into your rectum to feel the back of your prostate gland. This exam detects the size of your gland and any abnormal lumps or growths. An exam of your urine (urinalysis). A prostate specific antigen (PSA) screening. This is a blood test used to screen for prostate cancer. An ultrasound. This test uses sound waves to electronically produce a picture of your prostate gland. Your health care provider may refer you to a specialist in kidney and prostate diseases (urologist). How is this treated? Once symptoms begin, your health care provider will monitor your condition (active surveillance or watchful waiting). Treatment for this condition will depend on the severity of your condition. Treatment may include: Observation and yearly exams. This may be the only treatment needed if your condition and symptoms are mild. Medicines to relieve your symptoms, including: Medicines to shrink the prostate. Medicines to relax the muscle of the prostate. Surgery in severe cases. Surgery may include: Prostatectomy. In this procedure, the prostate tissue is removed completely through an open incision or with a laparoscope or robotics. Transurethral resection of the prostate (TURP). In this procedure, a tool is inserted through the opening at the tip of the penis (urethra). It is used to cut away tissue of the inner core of the prostate. The pieces are removed through the same opening of the penis. This removes the blockage. Transurethral incision (TUIP). In this procedure, small cuts are made in the prostate. This lessens the prostate's pressure on the urethra. Transurethral microwave thermotherapy (TUMT). This procedure uses microwaves to create heat. The heat destroys and removes a   small amount of prostate tissue. Transurethral needle ablation (TUNA). This procedure uses radio frequencies to destroy and remove a small amount of prostate tissue. Interstitial laser coagulation (ILC).  This procedure uses a laser to destroy and remove a small amount of prostate tissue. Transurethral electrovaporization (TUVP). This procedure uses electrodes to destroy and remove a small amount of prostate tissue. Prostatic urethral lift. This procedure inserts an implant to push the lobes of the prostate away from the urethra. Follow these instructions at home: Take over-the-counter and prescription medicines only as told by your health care provider. Monitor your symptoms for any changes. Contact your health care provider with any changes. Avoid drinking large amounts of liquid before going to bed or out in public. Avoid or reduce how much caffeine or alcohol you drink. Give yourself time when you urinate. Keep all follow-up visits as told by your health care provider. This is important. Contact a health care provider if: You have unexplained back pain. Your symptoms do not get better with treatment. You develop side effects from the medicine you are taking. Your urine becomes very dark or has a bad smell. Your lower abdomen becomes distended and you have trouble passing your urine. Get help right away if: You have a fever or chills. You suddenly cannot urinate. You feel lightheaded, or very dizzy, or you faint. There are large amounts of blood or clots in the urine. Your urinary problems become hard to manage. You develop moderate to severe low back or flank pain. The flank is the side of your body between the ribs and the hip. These symptoms may represent a serious problem that is an emergency. Do not wait to see if the symptoms will go away. Get medical help right away. Call your local emergency services (911 in the [place] Do not drive yourself to the hospital. Summary Benign prostatic hyperplasia (BPH) is an enlarged prostate that is caused by the normal aging process and not by cancer. An enlarged prostate can press on the urethra. This can make it hard to pass urine. This  condition is part of a normal aging process and is more likely to develop in men over the age of 50 years. Get help right away if you suddenly cannot urinate. This information is not intended to replace advice given to you by your health care provider. Make sure you discuss any questions you have with your health care provider. Document Revised: 09/26/2020 Document Reviewed: 02/23/2020 Elsevier Patient Education  2022 [employer]  

## 2021-02-28 ENCOUNTER — Encounter (HOSPITAL_COMMUNITY): Payer: Self-pay

## 2021-02-28 LAB — BASIC METABOLIC PANEL
BUN/Creatinine Ratio: 10 (ref 10–24)
BUN: 13 mg/dL (ref 8–27)
CO2: 25 mmol/L (ref 20–29)
Calcium: 9.2 mg/dL (ref 8.6–10.2)
Chloride: 100 mmol/L (ref 96–106)
Creatinine, Ser: 1.3 mg/dL — ABNORMAL HIGH (ref 0.76–1.27)
Glucose: 102 mg/dL — ABNORMAL HIGH (ref 65–99)
Potassium: 3.8 mmol/L (ref 3.5–5.2)
Sodium: 139 mmol/L (ref 134–144)
eGFR: 57 mL/min/{1.73_m2} — ABNORMAL LOW (ref >59)

## 2021-02-28 NOTE — Progress Notes (Signed)
Referral received for LCS. Patient not a candidate due to history of metastatic prostate cancer. Referring provider made aware. Referral closed at this time.

## 2021-03-07 ENCOUNTER — Encounter (HOSPITAL_COMMUNITY)
Admission: RE | Admit: 2021-03-07 | Discharge: 2021-03-07 | Disposition: A | Discharge status: Home / Self Care | Payer: Medicare Other | Source: Ambulatory Visit | Attending: Urology | Admitting: Urology

## 2021-03-07 ENCOUNTER — Other Ambulatory Visit: Payer: Self-pay

## 2021-03-07 ENCOUNTER — Ambulatory Visit (HOSPITAL_COMMUNITY)
Admission: RE | Admit: 2021-03-07 | Discharge: 2021-03-07 | Disposition: A | Discharge status: Home / Self Care | Payer: Medicare Other | Source: Ambulatory Visit | Attending: Urology | Admitting: Urology

## 2021-03-07 ENCOUNTER — Encounter (HOSPITAL_COMMUNITY): Payer: Self-pay

## 2021-03-07 DIAGNOSIS — C61 Malignant neoplasm of prostate: Secondary | ICD-10-CM | POA: Diagnosis not present

## 2021-03-07 DIAGNOSIS — C775 Secondary and unspecified malignant neoplasm of intrapelvic lymph nodes: Secondary | ICD-10-CM | POA: Diagnosis present

## 2021-03-07 MED ORDER — TECHNETIUM TC 99M MEDRONATE IV KIT
20.0000 | PACK | Freq: Once | INTRAVENOUS | Status: AC | PRN
  Administered 2021-03-07: 19.4 via INTRAVENOUS

## 2021-03-07 MED ORDER — IOHEXOL 350 MG/ML SOLN
80.0000 mL | Freq: Once | INTRAVENOUS | Status: AC | PRN
  Administered 2021-03-07: 80 mL via INTRAVENOUS

## 2021-03-07 NOTE — Patient Instructions (Signed)
Visit Information  PATIENT GOALS:  Goals Addressed             This Visit's Progress    Monitor and Manage My Blood Sugar-Diabetes Type 2   On track    Timeframe:  Long-Range Goal Priority:  Medium Start Date:   11/29/20                          Expected End Date:  11/29/21                      Follow Up Date 03/25/21   Take medication as prescribed Keep all medical appointments Check and record blood sugar twice a day Call PCP or Pharm with any readings outside of recommended range [phone] Call [place] as needed [phone] Follow an ADA/carb modified diet   Why is this important?   Checking your blood sugar at home helps to keep it from getting very high or very low.  Writing the results in a diary or log helps the doctor know how to care for you.  Your blood sugar log should have the time, date and the results.  Also, write down the amount of insulin or other medicine that you take.  Other information, like what you ate, exercise done and how you were feeling, will also be helpful.     Notes:      Take Care of My Teeth   Not on track    Timeframe:  Short-Term Goal Priority:  High Start Date: 11/15/20                            Expected End Date:  01/15/21                      Follow Up Date: 03/25/21   brush teeth twice a day floss teeth every day use fluoride toothpaste  Reach out to [facility] Dept regarding dental care Talk with [facility] Care Guides to obtain information on dental clinics  Call RN Care Manager as needed [phone]   Why is this important?   Good mouth and tooth care can help stop bad breath, tooth decay and gum disease.  It also can help you keep your teeth as you get older.    Notes:      Track and Manage My Blood Pressure-Hypertension   On track    Timeframe:  Long-Range Goal Priority:  Medium Start Date:  11/29/20                           Expected End Date:  11/29/21                      Follow Up Date 03/25/21     Check and record blood pressure at least 3 times per week Call PCP with any readings outside of recommended range [phone] Take medication as directed Keep all medical appointments Read food labels for sodium content and do not add extra salt to food Call [place] as needed [phone]   Why is this important?   You won't feel high blood pressure, but it can still hurt your blood vessels.  High blood pressure can cause heart or kidney problems. It can also cause a stroke.  Making lifestyle changes like losing a little weight or eating  less salt will help.  Checking your blood pressure at home and at different times of the day can help to control blood pressure.  If the doctor prescribes medicine remember to take it the way the doctor ordered.  Call the office if you cannot afford the medicine or if there are questions about it.     Notes:         The patient verbalized understanding of instructions, educational materials, and care plan provided today and declined offer to receive copy of patient instructions, educational materials, and care plan.    Plan:Telephone follow up appointment with care management team member scheduled for:  03/25/21 and The patient has been provided with contact information for the care management team and has been advised to call with any health related questions or concerns.   Chong Sicilian, BSN, RN-BC Embedded Chronic Care Manager [facility] [place] [facility] Direct Dial: [phone]

## 2021-03-07 NOTE — Chronic Care Management (AMB) (Signed)
Chronic Care Management   CCM RN Visit Note  02/22/2021 Name: Andrew [name] MRN: [id number] DOB: July 10, 1945  Subjective: Andrew [name] is a 75 y.o. year old male who is a primary care patient of Stacks, Cletus Gash, MD. The care management team was consulted for assistance with disease management and care coordination needs.    Engaged with patient by telephone for follow up visit in response to provider referral for case management and/or care coordination services.   Consent to Services:  The patient was given information about Chronic Care Management services, agreed to services, and gave verbal consent prior to initiation of services.  Please see initial visit note for detailed documentation.   Patient agreed to services and verbal consent obtained.   Assessment: Review of patient past medical history, allergies, medications, health status, including review of consultants reports, laboratory and other test data, was performed as part of comprehensive evaluation and provision of chronic care management services.   SDOH (Social Determinants of Health) assessments and interventions performed:    CCM Care Plan  No Known Allergies  Outpatient Encounter Medications as of 02/22/2021  Medication Sig Note   aspirin 81 MG tablet Take 81 mg by mouth daily.    atorvastatin (LIPITOR) 40 MG tablet TAKE 1 TABLET BY MOUTH  DAILY    cholecalciferol (VITAMIN D) 1000 UNITS tablet Take 1,000 Units by mouth daily.     Cinnamon 500 MG capsule Take 500 mg by mouth daily as needed (blood glucose levels).    clotrimazole-betamethasone (LOTRISONE) cream Apply 1 application topically 2 (two) times daily.    Empagliflozin-linaGLIPtin (GLYXAMBI) 10-5 MG TABS Take 1 tablet by mouth daily. 11/07/2019: #21 tabs QAS#[id number] A EXP 3/22   enzalutamide (XTANDI) 80 MG tablet Take 2 tablets (160 mg total) by mouth daily.    glucose blood test strip 1 each by Other route as needed for other. Use as instructed to test blood sugar  daily.  One Touch Verio Test Strips DX: E11.65 10/17/2019: One Touch Verio   lisinopril-hydrochlorothiazide (ZESTORETIC) 20-25 MG tablet TAKE 1 TABLET BY MOUTH  DAILY    metFORMIN (GLUCOPHAGE) 1000 MG tablet Take 1 tablet (1,000 mg total) by mouth 2 (two) times daily with a meal.    [EXPIRED] molnupiravir EUA 200 mg CAPS Take 4 capsules (800 mg total) by mouth 2 (two) times daily for 5 days.    sildenafil (VIAGRA) 25 MG tablet Take 25 mg by mouth daily as needed for erectile dysfunction.     tadalafil (CIALIS) 20 MG tablet Take 20 mg by mouth daily as needed for erectile dysfunction.     tamsulosin (FLOMAX) 0.4 MG CAPS capsule Take 1 capsule (0.4 mg total) by mouth daily.    tizanidine (ZANAFLEX) 6 MG capsule Take 1 capsule (6 mg total) by mouth 3 (three) times daily as needed for muscle spasms.    traZODone (DESYREL) 150 MG tablet Use from 1/3 to 1 tablet nightly as needed for sleep.    [DISCONTINUED] alfuzosin (UROXATRAL) 10 MG 24 hr tablet Take 1 tablet (10 mg total) by mouth at bedtime.    No facility-administered encounter medications on file as of 02/22/2021.    Patient Active Problem List   Diagnosis Date Noted   Aortic atherosclerosis ([place]) 01/18/2021   Primary insomnia 07/26/2020   Sacroiliitis ([place]) 07/26/2020   Nocturia 02/08/2020   Nonspecific abnormal electrocardiogram (ECG) (EKG) 07/18/2019   Type 2 diabetes mellitus with complication, without long-term current use of insulin ([place]) 07/18/2019   Educated about  COVID-19 virus infection 07/18/2019   SOB (shortness of breath) 12/10/2016   Erectile dysfunction due to arterial insufficiency 10/30/2016   Polyp of colon 10/30/2016   Peripheral vascular insufficiency ([place]) 06/13/2016   Vitamin D deficiency 08/24/2015   Prostate cancer metastatic to intrapelvic lymph node ([place]) 04/07/2014   Diabetes mellitus type 2, uncontrolled ([place]) 09/14/2010   Benign prostatic hyperplasia with urinary obstruction 09/14/2010   Essential  hypertension 09/14/2010   Hyperlipidemia associated with type 2 diabetes mellitus ([place]) 09/14/2010   Tobacco abuse 09/14/2010    Conditions to be addressed/monitored:HTN, DMII, and poor oral health  Care Plan : [facility] Care Plan     Problem: Chronic Diseaes Managment Needs   Priority: Medium     Goal: Work with [facility] Regarding Care Management and Care Coordination Associated with   Start Date: 02/22/2021  This Visit's Progress: On track  Priority: Medium  Note:   Current Barriers:  Chronic Disease Management support and education needs related to hypertension in a patient with diabetes and poor oral health Unable to independently drive  RNCM Clinical Goal(s):  Patient will continue to work with RN Care Manager to address care management and care coordination needs related to HTN, DMII, and poor oral health  through collaboration with RN Care manager, provider, and care team.   Interventions: 1:1 collaboration with primary care provider regarding development and update of comprehensive plan of care as evidenced by provider attestation and co-signature Inter-disciplinary care team collaboration (see longitudinal plan of care) Evaluation of current treatment plan related to  self management and patient's adherence to plan as established by provider   Diabetes:  (Status: Goal on track: YES.) Lab Results  Component Value Date   HGBA1C 6.1 01/29/2021  Assessed patient's understanding of A1c goal: <7% Provided education to patient about basic DM disease process; Reviewed medications with patient and discussed importance of medication adherence;        Reviewed prescribed diet with patient ADA Carb modified diet; Counseled on importance of regular laboratory monitoring as prescribed;        Provided patient with written educational materials related to hypo and hyperglycemia and importance of correct treatment;       Advised patient, providing education and rationale, to check cbg daily  and record        call provider for findings outside established parameters;       Assessed social determinant of health barriers;           Hypertension: (Status: Goal on track: YES.) Last practice recorded BP readings:  BP Readings from Last 3 Encounters:  02/27/21 112/67  01/29/21 122/74  01/16/21 119/68  Most recent eGFR/CrCl:  Lab Results  Component Value Date   EGFR 57 (L) 02/27/2021    No components found for: CRCL   Reviewed medications with patient and discussed importance of medication adherence Encouraged patient to check and record blood pressure at least 3 times per week and to call PCP with any readings outside of recommended range Verbal education provided on low sodium/DASH diet Discussed plans with patient for ongoing care management follow up and provided patient with direct contact information for care management team   Oral Health:  (Status: Goal on track: NO.) Reviewed and discussed medications Previously placed referral to [place] who contacted the patient and provided him with information on the [facility] Confirmed that patient has this information Patient has not reached out to the clinic as of today Encouraged patient to reach  out to the [facility] to see what type of services they offer Discussed his current oral health  Has missing, loose and painful teeth Goal is to have them removed and get dentures Discussed affect of oral health on food intake Able to eat soft foods. Has difficulty with hard or chewy foods Not impacting his ability to eat enough nutritious meals Verbal education provided on the effect of poor oral health on overall health Discussed good oral hygiene practices Provided with RN Care Manager contact number and encouraged to reach out as needed  Patient Goals/Self-Care Activities: Patient will self administer medications as prescribed Patient will attend church or other social  activities Patient will continue to perform ADL's independently Patient will continue to perform IADL's independently Patient will call provider office for new concerns or questions       Plan:Telephone follow up appointment with care management team member scheduled for:  03/25/21 and The patient has been provided with contact information for the care management team and has been advised to call with any health related questions or concerns.   Chong Sicilian, BSN, RN-BC Embedded Chronic Care Manager [facility] [place] Management Direct Dial: [phone]

## 2021-03-07 NOTE — Progress Notes (Signed)
Sent via mail 

## 2021-03-25 ENCOUNTER — Telehealth: Payer: Medicare Other | Admitting: *Deleted

## 2021-03-25 ENCOUNTER — Telehealth: Payer: Self-pay | Admitting: *Deleted

## 2021-03-27 ENCOUNTER — Ambulatory Visit (INDEPENDENT_AMBULATORY_CARE_PROVIDER_SITE_OTHER): Payer: Medicare Other | Admitting: Urology

## 2021-03-27 ENCOUNTER — Encounter: Payer: Self-pay | Admitting: Urology

## 2021-03-27 ENCOUNTER — Other Ambulatory Visit: Payer: Self-pay

## 2021-03-27 VITALS — BP 125/69 | HR 79 | Temp 99.0°F | Ht 68.0 in | Wt 207.6 lb

## 2021-03-27 DIAGNOSIS — C61 Malignant neoplasm of prostate: Secondary | ICD-10-CM | POA: Diagnosis not present

## 2021-03-27 DIAGNOSIS — C775 Secondary and unspecified malignant neoplasm of intrapelvic lymph nodes: Secondary | ICD-10-CM | POA: Diagnosis not present

## 2021-03-27 LAB — URINALYSIS, ROUTINE W REFLEX MICROSCOPIC
Bilirubin, UA: NEGATIVE
Glucose, UA: NEGATIVE
Ketones, UA: NEGATIVE
Leukocytes,UA: NEGATIVE
Nitrite, UA: NEGATIVE
Protein,UA: NEGATIVE
RBC, UA: NEGATIVE
Specific Gravity, UA: 1.015 (ref 1.005–1.030)
Urobilinogen, Ur: 1 mg/dL (ref 0.2–1.0)
pH, UA: 6.5 (ref 5.0–7.5)

## 2021-03-27 NOTE — Patient Instructions (Signed)
Prostate Cancer °The prostate is a small gland that helps make semen. It is located below a man's bladder, in front of the rectum. Prostate cancer is when abnormal cells grow in this gland. °What are the causes? °The cause of this condition is not known. °What increases the risk? °Being age 75 or older. °Having a family history of prostate cancer. °Having a family history of cancer of the breasts or ovaries. °Having genes that are passed from parent to child (inherited). °Having Lynch syndrome. °African American men and men of African descent are diagnosed with prostate cancer at higher rates than other men. °What are the signs or symptoms? °Problems peeing (urinating). This may include: °A stream that is weak, or pee that stops and starts. °Trouble starting or stopping your pee. °Trouble emptying all of your pee. °Needing to pee more often, especially at night. °Blood in your pee or semen. °Pain in the: °Lower back. °Lower belly (abdomen). °Hips. °Trouble getting an erection. °Weakness or numbness in the legs or feet. °How is this treated? °Treatment for this condition depends on: °How much the cancer has spread. °Your age. °The kind of treatment you want. °Your health. °Treatments include: °Being watched. This is called observation. You will be tested from time to time, but you will not get treated. Tests are to make sure that the cancer is not growing. °Surgery. This may be done to: °Take out (remove) the prostate. °Freeze and kill cancer cells. °Radiation. This uses a strong beam of energy to kill cancer cells. °Chemotherapy. This uses medicines that stop cancer cells from increasing. This kills cancer cells and healthy cells. °Targeted therapy. This kills cancer cells only. Healthy cells are not affected. °Hormone treatment. This stops the body from making hormones that help the cancer cells grow. °Follow these instructions at home: °Lifestyle °Do not smoke or use any products that contain nicotine or tobacco.  If you need help quitting, ask your doctor. °Eat a healthy diet. °Treatment may affect your ability to have sex. If you have a partner, touch, hold, hug, and caress your partner to have intimate moments. °Get plenty of sleep. °Ask your doctor for help to find a support group for men with prostate cancer. °General instructions °Take over-the-counter and prescription medicines only as told by your doctor. °If you have to go to the hospital, let your cancer doctor (oncologist) know. °Keep all follow-up visits. °Where to find more information °American Cancer Society: www.cancer.org °American Society of Clinical Oncology: www.cancer.net °National Cancer Institute: www.cancer.gov °Contact a doctor if: °You have new or more trouble peeing. °You have new or more blood in your pee. °You have new or more pain in your hips, back, or chest. °Get help right away if: °You have weakness in your legs. °You lose feeling in your legs. °You cannot control your pee or your poop (stool). °You have chills or a fever. °Summary °The prostate is a male gland that helps make semen. °Prostate cancer is when abnormal cells grow in this gland. °Treatment includes doing surgery, using medicines, using strong beams of energy, or watching without treatment. °Ask your doctor for help to find a support group for men with prostate cancer. °Contact a doctor if you have problems peeing or have any new pain that you did not have before. °This information is not intended to replace advice given to you by your health care provider. Make sure you discuss any questions you have with your health care provider. °Document Revised: 09/12/2020 Document Reviewed: 09/12/2020 °Elsevier   Patient Education © 2022 Elsevier Inc. ° °

## 2021-03-27 NOTE — Progress Notes (Signed)
Urological Symptom Review  Patient is experiencing the following symptoms: Frequent urination Hard to postpone urination Get up at night to urinate Stream starts and stops Have to strain to urinate   Review of Systems  Gastrointestinal (upper)  : Indigestion/heartburn  Gastrointestinal (lower) : Negative for lower GI symptoms  Constitutional : Night Sweats  Skin: Negative for skin symptoms  Eyes: Blurred vision  Ear/Nose/Throat : Negative for Ear/Nose/Throat symptoms  Hematologic/Lymphatic: Negative for Hematologic/Lymphatic symptoms  Cardiovascular : Negative for cardiovascular symptoms  Respiratory : Negative for respiratory symptoms  Endocrine: Negative for endocrine symptoms  Musculoskeletal: Back pain Joint pain  Neurological: Negative for neurological symptoms  Psychologic: Negative for psychiatric symptoms

## 2021-03-27 NOTE — Progress Notes (Signed)
03/27/2021 3:03 PM   Andrew [name] 03-Dec-1945 [id number]  Referring provider: [name], MD [place],  [place] [id number]  Followup prostate cancer   HPI: Andrew [name] is a 75yo here for followup metastatic prostate cancer. He is currently on eligard and xtandi. He underwent CT which showed enlarging pelvic lymph nodes. Bone scan negative. He denies any worsening LUTS. No unexplained weight loss. No hematuria or dysuria. No other complaints today    PMH: Past Medical History:  Diagnosis Date   Anemia    BPH (benign prostatic hyperplasia)    Sees Dr Andrew [name]   Cataract    Diabetes mellitus without complication ([place])    Hyperlipidemia    Hypertension    Low serum vitamin D    Prostate cancer ([place]) 2008   w/seed implantation and radiation    Surgical History: Past Surgical History:  Procedure Laterality Date   COLONOSCOPY  2015,2018   PROSTATE SURGERY  2008   seed implant    Home Medications:  Allergies as of 03/27/2021   No Known Allergies      Medication List        Accurate as of March 27, 2021  3:03 PM. If you have any questions, ask your nurse or doctor.          alfuzosin 10 MG 24 hr tablet Commonly known as: UROXATRAL Take 1 tablet (10 mg total) by mouth [name] bedtime.   aspirin 81 MG tablet Take 81 mg by mouth daily.   atorvastatin 40 MG tablet Commonly known as: LIPITOR TAKE 1 TABLET BY MOUTH  DAILY   cholecalciferol 1000 units tablet Commonly known as: VITAMIN D Take 1,000 Units by mouth daily.   Cinnamon 500 MG capsule Take 500 mg by mouth daily as needed (blood glucose levels).   clotrimazole-betamethasone cream Commonly known as: Lotrisone Apply 1 application topically 2 (two) times daily.   enzalutamide 80 MG tablet Commonly known as: Xtandi Take 2 tablets (160 mg total) by mouth daily.   glucose blood test strip 1 each by Other route as needed for other. Use as instructed to test blood sugar daily.  One Touch Verio  Test Strips DX: E11.65   Glyxambi 10-5 MG Tabs Generic drug: Empagliflozin-linaGLIPtin Take 1 tablet by mouth daily.   lisinopril-hydrochlorothiazide 20-25 MG tablet Commonly known as: ZESTORETIC TAKE 1 TABLET BY MOUTH  DAILY   metFORMIN 1000 MG tablet Commonly known as: GLUCOPHAGE Take 1 tablet (1,000 mg total) by mouth 2 (two) times daily with a meal.   sildenafil 25 MG tablet Commonly known as: VIAGRA Take 25 mg by mouth daily as needed for erectile dysfunction.   tadalafil 20 MG tablet Commonly known as: CIALIS Take 20 mg by mouth daily as needed for erectile dysfunction.   tamsulosin 0.4 MG Caps capsule Commonly known as: FLOMAX Take 1 capsule (0.4 mg total) by mouth daily.   tizanidine 6 MG capsule Commonly known as: ZANAFLEX Take 1 capsule (6 mg total) by mouth 3 (three) times daily as needed for muscle spasms.   traZODone 150 MG tablet Commonly known as: DESYREL Use from 1/3 to 1 tablet nightly as needed for sleep.        Allergies: No Known Allergies  Family History: Family History  Problem Relation Age of Onset   Diabetes Mother    Heart disease Mother    Kidney disease Mother        DIALYSIS   Emphysema Father    Deep vein  thrombosis Sister    Kidney disease Sister    Lung cancer Sister    Colon polyps Brother    Hypertension Brother    Gout Brother    Colon cancer Neg Hx    Esophageal cancer Neg Hx    Rectal cancer Neg Hx    Prostate cancer Neg Hx     Social History:  reports that he has been smoking cigarettes. He started smoking about 55 years ago. He has a 55.00 pack-year smoking history. He has never used smokeless tobacco. He reports that he does not drink alcohol and does not use drugs.  ROS: All other review of systems were reviewed and are negative except what is noted above in HPI  Physical Exam: BP 125/69 (BP Location: Left Arm, Patient Position: Sitting, Cuff Size: Normal)   Pulse 79   Temp 99 F (37.2 C)   Ht 5\' 8"  (1.727  m)   Wt 207 lb 9.6 oz (94.2 kg)   BMI 31.57 kg/m   Constitutional:  Alert and oriented, No acute distress. HEENT: Andrew [name], moist mucus membranes.  Trachea midline, no masses. Cardiovascular: No clubbing, cyanosis, or edema. Respiratory: Normal respiratory effort, no increased work of breathing. GI: Abdomen is soft, nontender, nondistended, no abdominal masses GU: No CVA tenderness.  Lymph: No cervical or inguinal lymphadenopathy. Skin: No rashes, bruises or suspicious lesions. Neurologic: Grossly intact, no focal deficits, moving all 4 extremities. Psychiatric: Normal mood and affect.  Laboratory Data: Lab Results  Component Value Date   WBC 6.6 01/29/2021   HGB 11.9 (L) 01/29/2021   HCT 35.8 (L) 01/29/2021   MCV 88 01/29/2021   PLT 199 01/29/2021    Lab Results  Component Value Date   CREATININE 1.30 (H) 02/27/2021    Lab Results  Component Value Date   PSA 1.6 08/10/2019    Lab Results  Component Value Date   TESTOSTERONE <3 (L) 04/10/2020    Lab Results  Component Value Date   HGBA1C 6.1 01/29/2021    Urinalysis    Component Value Date/Time   COLORURINE YELLOW 07/02/2017 0410   APPEARANCEUR Clear 02/27/2021 1435   LABSPEC 1.018 07/02/2017 0410   PHURINE 7.0 07/02/2017 0410   GLUCOSEU 1+ (A) 02/27/2021 1435   HGBUR NEGATIVE 07/02/2017 0410   BILIRUBINUR Negative 02/27/2021 1435   KETONESUR NEGATIVE 07/02/2017 0410   PROTEINUR Negative 02/27/2021 1435   PROTEINUR NEGATIVE 07/02/2017 0410   UROBILINOGEN negative 07/19/2014 1002   NITRITE Negative 02/27/2021 1435   NITRITE NEGATIVE 07/02/2017 0410   LEUKOCYTESUR Negative 02/27/2021 1435    Lab Results  Component Value Date   LABMICR Comment 02/27/2021   WBCUA None seen 01/10/2020   RBCUA None seen 08/11/2018   LABEPIT None seen 01/10/2020   MUCUS neg 06/21/2013   BACTERIA None seen 01/10/2020    Pertinent Imaging: CT abd/pelvis 9/8 and bone scan: Images reviewed and discussed with the  patient No results found for this or any previous visit.  No results found for this or any previous visit.  No results found for this or any previous visit.  No results found for this or any previous visit.  No results found for this or any previous visit.  No results found for this or any previous visit.  No results found for this or any previous visit.  No results found for this or any previous visit.   Assessment & Plan:    1. Prostate cancer metastatic to intrapelvic lymph node [facility]) -Referral to medical  oncology.    No follow-ups on file.  Nicolette Bang, MD  [facility] [place]

## 2021-03-28 NOTE — Telephone Encounter (Signed)
  Care Management   Follow Up Note   03/25/2021 Name: Andrew [name] MRN: [id number] DOB: 01/28/1946   Referred by: [name], MD Reason for referral : Chronic Care Management (Unsuccessful follow-up)   An unsuccessful telephone outreach was attempted today. The patient was referred to the case management team for assistance with care management and care coordination.   Follow Up Plan: A HIPPA compliant phone message was left for the patient providing contact information and requesting a return call. Forwarding to [facility] Care Guide for outreach and rescheduling  Chong Sicilian, BSN, RN-BC [place] / [place] Management Direct Dial: [phone]

## 2021-04-01 NOTE — Progress Notes (Signed)
[facility] [address], [place] [address]   CLINIC:  Medical Oncology/Hematology  CONSULT NOTE  Patient Care Team: [name], MD as PCP - General (Family Medicine) Minus Breeding, MD as Consulting Physician (Cardiology) McKenzie, Candee Furbish, MD as Consulting Physician (Urology) Ilean China, RN as Case Manager Pruitt, Royce Macadamia, [facility] (Pharmacist) Steffanie Rainwater, DPM as Consulting Physician (Podiatry) Pyrtle, Lajuan Lines, MD as Consulting Physician (Gastroenterology) Harlen Labs, MD as Referring Physician (Optometry)  CHIEF COMPLAINTS/PURPOSE OF CONSULTATION:  Evaluation of metastatic castration refractory prostate cancer  HISTORY OF PRESENTING ILLNESS:  [name] 75 y.o. male is here because of evaluation of prostate cancer, at the request of Dr. Alyson Ingles at [facility].  He is currently on Lupron injection and enzalutamide which was started around October 2021.  His most recent PSA has gone up.  Today he reports feeling well. He reports occasional hot flashes, and he denies any new pains and recent falls. He denies severe fatigue, history of seizures, cough, hemoptysis, tingling/numbness, and n/v/d. He reports SOB upon exertions and occasional dizziness. He denies history of CVA and MI. He is not currently taking any blood thinners. 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 He is a smoker and has smoked 1 ppd for 57 years. His sister passed from lung cancer.   MEDICAL HISTORY:  Past Medical History:  Diagnosis Date   Anemia    BPH (benign prostatic hyperplasia)    Sees Dr [name]   Cataract    Diabetes mellitus without complication ([place])    Hyperlipidemia    Hypertension    Low serum vitamin D    Prostate cancer ([place]) 2008   w/seed implantation and radiation    SURGICAL HISTORY: Past Surgical History:  Procedure Laterality Date   COLONOSCOPY  2015,2018   PROSTATE SURGERY  2008   seed implant    SOCIAL  HISTORY: Social History   Socioeconomic History   Marital status: Married    Spouse name: [place]   Number of children: 2   Years of education: 12   Highest education level: 12th grade  Occupational History   Occupation: [name]    Comment: Retired Geologist, engineering  Tobacco Use   Smoking status: Every Day    Packs/day: 1.00    Years: 55.00    Pack years: 55.00    Types: Cigarettes    Start date: 06/30/1965   Smokeless tobacco: Never   Tobacco comments:    Has tried nicotine replacement gum  Vaping Use   Vaping Use: Never used  Substance and Sexual Activity   Alcohol use: No   Drug use: No   Sexual activity: Yes  Other Topics Concern   Not on file  Social History Narrative   Retired, lives at home with wife [name], two grown children, enjoys gardening     Social Determinants of Radio broadcast assistant Strain: Low Risk    Difficulty of Paying Living Expenses: Not hard at all  Food Insecurity: No Food Insecurity   Worried About Charity fundraiser in the Last Year: Never true   Arboriculturist in the Last Year: Never true  Transportation Needs: No Transportation Needs   Lack of Transportation (Medical): No   Lack of Transportation (Non-Medical): No  Physical Activity: Inactive   Days of Exercise per Week: 0 days   Minutes of Exercise per Session: 0 min  Stress: No Stress Concern  Present   Feeling of Stress : Not at all  Social Connections: Socially Integrated   Frequency of Communication with Friends and Family: More than three times a week   Frequency of Social Gatherings with Friends and Family: More than three times a week   Attends Religious Services: More than 4 times per year   Active Member of [employer] or Organizations: Yes   Attends Music therapist: More than 4 times per year   Marital Status: Married  Human resources officer Violence: Not At Risk   Fear of Current or Ex-Partner: No   Emotionally Abused: No   Physically Abused: No   Sexually  Abused: No    FAMILY HISTORY: Family History  Problem Relation Age of Onset   Diabetes Mother    Heart disease Mother    Kidney disease Mother        DIALYSIS   Emphysema Father    Deep vein thrombosis Sister    Kidney disease Sister    Lung cancer Sister    Colon polyps Brother    Hypertension Brother    Gout Brother    Colon cancer Neg Hx    Esophageal cancer Neg Hx    Rectal cancer Neg Hx    Prostate cancer Neg Hx     ALLERGIES:  has No Known Allergies.  MEDICATIONS:  Current Outpatient Medications  Medication Sig Dispense Refill   alfuzosin (UROXATRAL) 10 MG 24 hr tablet Take 1 tablet (10 mg total) by mouth at bedtime. 30 tablet 11   aspirin 81 MG tablet Take 81 mg by mouth daily.     atorvastatin (LIPITOR) 40 MG tablet TAKE 1 TABLET BY MOUTH  DAILY 90 tablet 3   cholecalciferol (VITAMIN D) 1000 UNITS tablet Take 1,000 Units by mouth daily.      clotrimazole-betamethasone (LOTRISONE) cream Apply 1 application topically 2 (two) times daily. 30 g 0   Empagliflozin-linaGLIPtin (GLYXAMBI) 10-5 MG TABS Take 1 tablet by mouth daily.     enzalutamide (XTANDI) 80 MG tablet Take 2 tablets (160 mg total) by mouth daily. 60 tablet 11   glucose blood test strip 1 each by Other route as needed for other. Use as instructed to test blood sugar daily.  One Touch Verio Test Strips DX: E11.65 100 each 11   lisinopril-hydrochlorothiazide (ZESTORETIC) 20-25 MG tablet TAKE 1 TABLET BY MOUTH  DAILY 90 tablet 3   metFORMIN (GLUCOPHAGE) 1000 MG tablet Take 1 tablet (1,000 mg total) by mouth 2 (two) times daily with a meal. 180 tablet 3   tamsulosin (FLOMAX) 0.4 MG CAPS capsule Take 1 capsule (0.4 mg total) by mouth daily. 90 capsule 3   tizanidine (ZANAFLEX) 6 MG capsule Take 1 capsule (6 mg total) by mouth 3 (three) times daily as needed for muscle spasms. 90 capsule 1   Cinnamon 500 MG capsule Take 500 mg by mouth daily as needed (blood glucose levels). (Patient not taking: Reported on  04/02/2021)     sildenafil (VIAGRA) 25 MG tablet Take 25 mg by mouth daily as needed for erectile dysfunction.  (Patient not taking: Reported on 04/02/2021)     tadalafil (CIALIS) 20 MG tablet Take 20 mg by mouth daily as needed for erectile dysfunction.  (Patient not taking: Reported on 04/02/2021)     traZODone (DESYREL) 150 MG tablet Use from 1/3 to 1 tablet nightly as needed for sleep. (Patient not taking: Reported on 04/02/2021) 90 tablet 3   No current facility-administered medications for this visit.  REVIEW OF SYSTEMS:   Review of Systems  Constitutional:  Negative for appetite change (70%) and fatigue (40%).  Respiratory:  Positive for shortness of breath. Negative for cough and hemoptysis.   Gastrointestinal:  Negative for diarrhea, nausea and vomiting.  Endocrine: Positive for hot flashes.  Genitourinary:  Positive for difficulty urinating (flow issues).   Neurological:  Positive for dizziness. Negative for numbness and seizures.  All other systems reviewed and are negative.   PHYSICAL EXAMINATION: ECOG PERFORMANCE STATUS: 1 - Symptomatic but completely ambulatory  Vitals:   04/02/21 0802  BP: 137/77  Pulse: 68  Resp: 18  Temp: (!) 96.7 F (35.9 C)  SpO2: 97%   Filed Weights   04/02/21 0802  Weight: 205 lb 14.6 oz (93.4 kg)   Physical Exam Vitals reviewed.  Constitutional:      Appearance: Normal appearance. He is obese.  Cardiovascular:     Rate and Rhythm: Normal rate and regular rhythm.     Pulses: Normal pulses.     Heart sounds: Normal heart sounds.  Pulmonary:     Effort: Pulmonary effort is normal.     Breath sounds: Normal breath sounds.  Abdominal:     Palpations: Abdomen is soft. There is no hepatomegaly, splenomegaly or mass.     Tenderness: There is no abdominal tenderness.  Musculoskeletal:     Right lower leg: No edema.     Left lower leg: No edema.  Lymphadenopathy:     Upper Body:     Right upper body: No supraclavicular adenopathy.      Left upper body: No supraclavicular adenopathy.     Lower Body: No right inguinal adenopathy. No left inguinal adenopathy.  Neurological:     General: No focal deficit present.     Mental Status: He is alert and oriented to person, place, and time.  Psychiatric:        Mood and Affect: Mood normal.        Behavior: Behavior normal.     LABORATORY DATA:  I have reviewed the data as listed CBC Latest Ref Rng & Units 01/29/2021 10/24/2020 07/26/2020  WBC 3.4 - 10.8 x10E3/uL 6.6 6.2 6.8  Hemoglobin 13.0 - 17.7 g/dL 11.9(L) 11.8(L) 11.1(L)  Hematocrit 37.5 - 51.0 % 35.8(L) 35.5(L) 31.7(L)  Platelets 150 - 450 x10E3/uL 199 226 213   CMP Latest Ref Rng & Units 02/27/2021 01/29/2021 10/24/2020  Glucose 65 - 99 mg/dL 102(H) 111(H) 107(H)  BUN 8 - 27 mg/dL '13 19 20  ' Creatinine 0.76 - 1.27 mg/dL 1.30(H) 1.50(H) 1.47(H)  Sodium 134 - 144 mmol/L 139 137 140  Potassium 3.5 - 5.2 mmol/L 3.8 4.1 3.8  Chloride 96 - 106 mmol/L 100 100 100  CO2 20 - 29 mmol/L '25 23 22  ' Calcium 8.6 - 10.2 mg/dL 9.2 9.1 9.2  Total Protein 6.0 - 8.5 g/dL - 6.4 6.8  Total Bilirubin 0.0 - 1.2 mg/dL - 0.5 0.3  Alkaline Phos 44 - 121 IU/L - 89 107  AST 0 - 40 IU/L - 19 23  ALT 0 - 44 IU/L - 11 15    RADIOGRAPHIC STUDIES: I have personally reviewed the radiological images as listed and agreed with the findings in the report. CT Abdomen Pelvis W Wo Contrast  Result Date: 03/07/2021 CLINICAL DATA:  Elevated PSA, castration resistant prostate cancer EXAM: CT ABDOMEN AND PELVIS WITHOUT AND WITH CONTRAST TECHNIQUE: Multidetector CT imaging of the abdomen and pelvis was performed following the standard protocol before and following the  bolus administration of intravenous contrast. CONTRAST:  15m OMNIPAQUE IOHEXOL 350 MG/ML SOLN COMPARISON:  03/09/2020 FINDINGS: Lower chest: No acute abnormality. Three-vessel coronary artery calcifications. Hepatobiliary: No solid liver abnormality is seen. No gallstones, gallbladder wall thickening,  or biliary dilatation. Pancreas: Unremarkable. No pancreatic ductal dilatation or surrounding inflammatory changes. Spleen: Normal in size without significant abnormality. Adrenals/Urinary Tract: Benign adenomatous thickening of the bilateral adrenal glands. Kidneys are normal, without renal calculi, solid lesion, or hydronephrosis. Bladder is unremarkable. Stomach/Bowel: Stomach is within normal limits. Appendix appears normal. No evidence of bowel wall thickening, distention, or inflammatory changes. Descending and sigmoid diverticula. Vascular/Lymphatic: Aortic atherosclerosis. Interval enlargement of a lymph node at the left iliac bifurcation measuring 3.5 x 2.6 cm, previously 2.8 x 2.0 cm (series 4, image 63). Reproductive: Prostate brachytherapy pellets. Other: No abdominal wall hernia or abnormality. No abdominopelvic ascites. Musculoskeletal: No acute or significant osseous findings. IMPRESSION: 1. Interval enlargement of a lymph node at the left iliac bifurcation, consistent with worsened nodal metastatic disease. 2. Prostate brachytherapy pellets. 3. Diverticulosis without evidence of diverticulitis. 4. Coronary artery disease. Aortic Atherosclerosis (ICD10-I70.0). Electronically Signed   By: AEddie CandleM.D.   On: 03/07/2021 12:20   NM Bone Scan Whole Body  Result Date: 03/11/2021 CLINICAL DATA:  Prostate cancer.  Surveillance examination. EXAM: NUCLEAR MEDICINE WHOLE BODY BONE SCAN TECHNIQUE: Whole body anterior and posterior images were obtained approximately 3 hours after intravenous injection of radiopharmaceutical. RADIOPHARMACEUTICALS:  19.4 mCi Technetium-941mDP IV COMPARISON:  02/28/2020 FINDINGS: Chronic scoliotic curvature of the spine. Chronic degenerative changes of the shoulders. No focal increased activity to suggest metastatic disease to bone. IMPRESSION: Negative surveillance whole-body bone scan. No change to suggest development of osseous metastatic disease. Electronically Signed    By: MaNelson Chimes.D.   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  CTAP on 08/26/2017 shows enlarged left pelvic sidewall lymph node 2.2 cm. - Lupron initiated on 08/10/2019. - CTAP on 03/09/2020 with left internal/external iliac lymph node measuring 2.1 x 2.8 cm (2.1 x 2.2 cm), craniocaudal 4.1 cm, previously 3.4 cm. - Enzalutamide to 160 mg daily started on 04/17/2020. - PSA on 02/20/2021 (5.2), 07/11/2020 (2.0), 02/08/2020 (3.1) - CTAP on 03/07/2021 with left iliac bifurcation lymph node measuring 3.5 x 2.6 cm (previously 2.8 x 2.0 cm) - Bone scan on 03/07/2021 negative.  2.  Social/family history: - He lives at home with his wife.  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 - Sister died of lung cancer.   PLAN:  Metastatic castration refractory prostate cancer to the left iliac lymph node: - He has progressed on enzalutamide.  He was told to finish the bottle and stop taking it. - He is asymptomatic.  He has occasional hot flashes.  Denies any tingling or numbness in extremities. - I have recommended genetic testing to see if he is a candidate for PARPi therapy. - I have also recommended biopsy of the left iliac lymph node for NGS testing (MSI, TMB, HRRm). - I will also obtain  serum testosterone level. - RTC after biopsy. - If there are no targetable mutations, options include radiation therapy to the lymph node and systemic therapy options (docetaxel).  2.  Smoking history: - He has 55 pack year smoking history and  his current active smoker. - We talked about low-dose lung cancer screening scans.  He is agreeable.   All questions were answered. The patient knows to call the clinic with any problems, questions or concerns.   Derek Jack, MD, 04/02/21 9:14 AM  [place] [phone]   I, Thana Ates, am acting as a scribe for Dr. Derek Jack.  I, Derek Jack MD, have reviewed the above documentation for accuracy and completeness, and I agree with the above.

## 2021-04-01 NOTE — Telephone Encounter (Signed)
Pt has been rescheduled. 

## 2021-04-02 ENCOUNTER — Encounter (HOSPITAL_COMMUNITY): Payer: Self-pay | Admitting: Hematology

## 2021-04-02 ENCOUNTER — Other Ambulatory Visit: Payer: Self-pay

## 2021-04-02 ENCOUNTER — Inpatient Hospital Stay (HOSPITAL_COMMUNITY): Payer: Medicare Other | Attending: Hematology | Admitting: Hematology

## 2021-04-02 ENCOUNTER — Inpatient Hospital Stay (HOSPITAL_COMMUNITY): Payer: Medicare Other

## 2021-04-02 VITALS — BP 137/77 | HR 68 | Temp 96.7°F | Resp 18 | Ht 68.0 in | Wt 205.9 lb

## 2021-04-02 DIAGNOSIS — Z122 Encounter for screening for malignant neoplasm of respiratory organs: Secondary | ICD-10-CM

## 2021-04-02 DIAGNOSIS — I10 Essential (primary) hypertension: Secondary | ICD-10-CM | POA: Diagnosis not present

## 2021-04-02 DIAGNOSIS — Z7984 Long term (current) use of oral hypoglycemic drugs: Secondary | ICD-10-CM | POA: Diagnosis not present

## 2021-04-02 DIAGNOSIS — C7951 Secondary malignant neoplasm of bone: Secondary | ICD-10-CM | POA: Diagnosis not present

## 2021-04-02 DIAGNOSIS — Z801 Family history of malignant neoplasm of trachea, bronchus and lung: Secondary | ICD-10-CM | POA: Insufficient documentation

## 2021-04-02 DIAGNOSIS — E119 Type 2 diabetes mellitus without complications: Secondary | ICD-10-CM | POA: Insufficient documentation

## 2021-04-02 DIAGNOSIS — R0602 Shortness of breath: Secondary | ICD-10-CM | POA: Diagnosis not present

## 2021-04-02 DIAGNOSIS — R42 Dizziness and giddiness: Secondary | ICD-10-CM | POA: Diagnosis not present

## 2021-04-02 DIAGNOSIS — C775 Secondary and unspecified malignant neoplasm of intrapelvic lymph nodes: Secondary | ICD-10-CM | POA: Insufficient documentation

## 2021-04-02 DIAGNOSIS — Z7982 Long term (current) use of aspirin: Secondary | ICD-10-CM | POA: Insufficient documentation

## 2021-04-02 DIAGNOSIS — Z79899 Other long term (current) drug therapy: Secondary | ICD-10-CM | POA: Diagnosis not present

## 2021-04-02 DIAGNOSIS — C61 Malignant neoplasm of prostate: Secondary | ICD-10-CM | POA: Insufficient documentation

## 2021-04-02 DIAGNOSIS — F17210 Nicotine dependence, cigarettes, uncomplicated: Secondary | ICD-10-CM | POA: Insufficient documentation

## 2021-04-02 LAB — PSA: Prostatic Specific Antigen: 3.2 ng/mL (ref 0.00–4.00)

## 2021-04-02 NOTE — Patient Instructions (Signed)
[place] at [facility] Discharge Instructions  You were seen today by Dr. Delton Coombes. He went over your recent results. Because of your smoking history you will be scheduled for a CT scan of your chest. You will also be scheduled for a biopsy of your left iliac lymph node as well as genetic testing. Dr. Delton Coombes will see you back in after the biopsy for labs and follow up.   Thank you for choosing [place] at [facility] to provide your oncology and hematology care.  To afford each patient quality time with our provider, please arrive at least 15 minutes before your scheduled appointment time.   If you have a lab appointment with the [facility] please come in thru the Main Entrance and check in at the main information desk  You need to re-schedule your appointment should you arrive 10 or more minutes late.  We strive to give you quality time with our providers, and arriving late affects you and other patients whose appointments are after yours.  Also, if you no show three or more times for appointments you may be dismissed from the clinic at the providers discretion.     Again, thank you for choosing [facility].  Our hope is that these requests will decrease the amount of time that you wait before being seen by our physicians.       _____________________________________________________________  Should you have questions after your visit to [facility], please contact our office at [phone] between the hours of 8:00 a.m. and 4:30 p.m.  Voicemails left after 4:00 p.m. will not be returned until the following business day.  For prescription refill requests, have your pharmacy contact our office and allow 72 hours.    Cancer Center Support Programs:   > Cancer Support Group  2nd Tuesday of the month 1pm-2pm, Journey Room

## 2021-04-03 ENCOUNTER — Encounter (HOSPITAL_COMMUNITY): Payer: Self-pay | Admitting: Radiology

## 2021-04-03 LAB — TESTOSTERONE: Testosterone: <3 ng/dL — ABNORMAL LOW (ref 264–916)

## 2021-04-03 NOTE — Progress Notes (Signed)
Patient Name  [name], [name] Legal Sex  Male DOB  08-Jan-1946 SSN  [id number] Address  [place]  [place] [address] Phone  [phone] (Home) *Preferred*    RE: CT Biopsy Received: Today Andrew Cleveland, MD  [name]   CT core L int iliac LAN  [id number] Se3   DDH        Previous Messages   ----- Message -----  From: Garth Bigness D  Sent: 04/02/2021   6:42 PM EDT  To: [facility] Procedure Requests  Subject: CT Biopsy                                       Procedure:   CT Biopsy   Reason:  Prostate cancer metastatic to intrapelvic lymph node, hx of prostate cancer - enlarged lymph node on recent CT   History:  NM Bone, CT in computer   Provider:  Derek Jack   Provider Contact:  [phone]

## 2021-04-04 ENCOUNTER — Ambulatory Visit (INDEPENDENT_AMBULATORY_CARE_PROVIDER_SITE_OTHER): Payer: Medicare Other | Admitting: *Deleted

## 2021-04-04 ENCOUNTER — Encounter: Payer: Self-pay | Admitting: *Deleted

## 2021-04-04 DIAGNOSIS — I10 Essential (primary) hypertension: Secondary | ICD-10-CM

## 2021-04-04 DIAGNOSIS — C775 Secondary and unspecified malignant neoplasm of intrapelvic lymph nodes: Secondary | ICD-10-CM

## 2021-04-04 DIAGNOSIS — E118 Type 2 diabetes mellitus with unspecified complications: Secondary | ICD-10-CM

## 2021-04-04 DIAGNOSIS — C61 Malignant neoplasm of prostate: Secondary | ICD-10-CM

## 2021-04-04 DIAGNOSIS — Z77128 Contact with and (suspected) exposure to other hazards in the physical environment: Secondary | ICD-10-CM

## 2021-04-04 NOTE — Patient Instructions (Signed)
Visit Information  PATIENT GOALS:  Goals Addressed             This Visit's Progress    RNCM: Find Help in My Community   On track    Timeframe:  Short-Term Goal Priority:  Medium Start Date:   04/04/21                          Expected End Date:    06/29/21                  Follow Up Date 05/06/21    Talk with [facility] Care Guides regarding community resources for assistance with roof repair Call RN Care Manager as needed [phone]    Why is this important?   Knowing how and where to find help for yourself or family in your neighborhood and community is an important skill.  You will want to take some steps to learn how.    Notes:      RNCM: Matintain My Quality of Life (prostate cancer dx)   On track    Timeframe:  Long-Range Goal Priority:  Medium Start Date:  04/04/21                           Expected End Date:    04/04/22                   Follow Up Date 04/10/21    Keep appointment for CT guided prostate biopsy on 04/08/21 Follow-up with oncologist Talk with family/friends daily Get outside daily Practice hobbies as often as possible Increase activity level as tolerated with a goal of 150 minutes a week Take medications as prescribed Call RN Care Manager as needed [phone] Consider talking with LCSW about anxiety, depression, etc associated with prostate cancer diagnosis   Why is this important?   Having a long-term illness can be scary.  It can also be stressful for you and your caregiver.  These steps may help.    Notes:      RNCM: Monitor and Manage My Blood Sugar-Diabetes Type 2   On track    Timeframe:  Long-Range Goal Priority:  Medium Start Date:   11/29/20                          Expected End Date:  11/29/21                      Follow Up Date 05/06/21   Take medication as prescribed Keep all medical appointments Check and record blood sugar twice a day Call PCP or Pharm with any readings outside of recommended range [phone] Call [place] as needed [phone] Follow an ADA/carb modified diet   Why is this important?   Checking your blood sugar at home helps to keep it from getting very high or very low.  Writing the results in a diary or log helps the doctor know how to care for you.  Your blood sugar log should have the time, date and the results.  Also, write down the amount of insulin or other medicine that you take.  Other information, like what you ate, exercise done and how you were feeling, will also be helpful.     Notes:      RNCM: Take Care of My Teeth   Not on track  Timeframe:  Short-Term Goal Priority:  High Start Date: 11/15/20                            Expected End Date:  01/15/21                      Follow Up Date: 05/06/21   brush teeth twice a day floss teeth every day use fluoride toothpaste  Reach out to [facility] Dept regarding dental care if you decide to schedule an appointment sooner than the first of the year Schedule an appt with a dentist after the first of the year when your insurance will cover better (per patient's reported conversation with ins carrier) Call RN Care Manager as needed [phone]   Why is this important?   Good mouth and tooth care can help stop bad breath, tooth decay and gum disease.  It also can help you keep your teeth as you get older.    Notes:      RNCM: Track and Manage My Blood Pressure-Hypertension   On track    Timeframe:  Long-Range Goal Priority:  Medium Start Date:  11/29/20                           Expected End Date:  11/29/21                      Follow Up Date 05/06/21    Check and record blood pressure at least 3 times per week Call PCP with any readings outside of recommended range [phone] Take medication as directed Keep all medical appointments Read food labels for sodium content and do not add extra salt to food Call [place] as needed [phone]   Why is this important?   You won't feel high blood  pressure, but it can still hurt your blood vessels.  High blood pressure can cause heart or kidney problems. It can also cause a stroke.  Making lifestyle changes like losing a little weight or eating less salt will help.  Checking your blood pressure at home and at different times of the day can help to control blood pressure.  If the doctor prescribes medicine remember to take it the way the doctor ordered.  Call the office if you cannot afford the medicine or if there are questions about it.     Notes:         The patient verbalized understanding of instructions, educational materials, and care plan provided today and declined offer to receive copy of patient instructions, educational materials, and care plan.   Patient has been scheduled for a telephone follow-up with RNCM on 04/10/21.  Chong Sicilian, BSN, RN-BC Embedded Chronic Care Manager [facility] Direct Dial: [phone]

## 2021-04-04 NOTE — Chronic Care Management (AMB) (Signed)
Chronic Care Management   CCM RN Visit Note  04/04/2021 Name: Andrew [name] MRN: [id number] DOB: 07/12/1945  Subjective: Andrew [name] is a 75 y.o. year old male who is a primary care patient of Stacks, Cletus Gash, MD. The care management team was consulted for assistance with disease management and care coordination needs.    Engaged with patient by telephone for follow up visit in response to provider referral for case management and/or care coordination services.   Consent to Services:  The patient was given information about Chronic Care Management services, agreed to services, and gave verbal consent prior to initiation of services.  Please see initial visit note for detailed documentation.   Patient agreed to services and verbal consent obtained.   Assessment: Review of patient past medical history, allergies, medications, health status, including review of consultants reports, laboratory and other test data, was performed as part of comprehensive evaluation and provision of chronic care management services.   SDOH (Social Determinants of Health) assessments and interventions performed:    CCM Care Plan  No Known Allergies  Outpatient Encounter Medications as of 04/04/2021  Medication Sig Note   alfuzosin (UROXATRAL) 10 MG 24 hr tablet Take 1 tablet (10 mg total) by mouth at bedtime.    aspirin 81 MG tablet Take 81 mg by mouth daily.    atorvastatin (LIPITOR) 40 MG tablet TAKE 1 TABLET BY MOUTH  DAILY    cholecalciferol (VITAMIN D) 1000 UNITS tablet Take 1,000 Units by mouth daily.     clotrimazole-betamethasone (LOTRISONE) cream Apply 1 application topically 2 (two) times daily. (Patient not taking: No sig reported)    enzalutamide (XTANDI) 80 MG tablet Take 2 tablets (160 mg total) by mouth daily. (Patient not taking: Reported on 04/04/2021)    glucose blood test strip 1 each by Other route as needed for other. Use as instructed to test blood sugar daily.  One Touch Verio Test Strips  DX: E11.65 10/17/2019: One Touch Verio   lisinopril-hydrochlorothiazide (ZESTORETIC) 20-25 MG tablet TAKE 1 TABLET BY MOUTH  DAILY    metFORMIN (GLUCOPHAGE) 1000 MG tablet Take 1 tablet (1,000 mg total) by mouth 2 (two) times daily with a meal.    tamsulosin (FLOMAX) 0.4 MG CAPS capsule Take 1 capsule (0.4 mg total) by mouth daily. (Patient not taking: Reported on 04/04/2021)    tizanidine (ZANAFLEX) 6 MG capsule Take 1 capsule (6 mg total) by mouth 3 (three) times daily as needed for muscle spasms. (Patient not taking: Reported on 04/04/2021)    traZODone (DESYREL) 150 MG tablet Use from 1/3 to 1 tablet nightly as needed for sleep. (Patient not taking: No sig reported)    No facility-administered encounter medications on file as of 04/04/2021.    Patient Active Problem List   Diagnosis Date Noted   Aortic atherosclerosis ([place]) 01/18/2021   Primary insomnia 07/26/2020   Sacroiliitis ([place]) 07/26/2020   Nocturia 02/08/2020   Nonspecific abnormal electrocardiogram (ECG) (EKG) 07/18/2019   Type 2 diabetes mellitus with complication, without long-term current use of insulin ([place]) 07/18/2019   Educated about COVID-19 virus infection 07/18/2019   SOB (shortness of breath) 12/10/2016   Erectile dysfunction due to arterial insufficiency 10/30/2016   Polyp of colon 10/30/2016   Peripheral vascular insufficiency ([place]) 06/13/2016   Vitamin D deficiency 08/24/2015   Prostate cancer metastatic to intrapelvic lymph node ([place]) 04/07/2014   Diabetes mellitus type 2, uncontrolled 09/14/2010   Benign prostatic hyperplasia with urinary obstruction 09/14/2010   Essential hypertension 09/14/2010  Hyperlipidemia associated with type 2 diabetes mellitus ([place]) 09/14/2010   Tobacco abuse 09/14/2010    Conditions to be addressed/monitored:HTN, DMII, and metastatic prostate cancer, poor oral health, hazards at home: leaking roof  Care Plan : [place]  Updates made by Ilean China, RN since 04/04/2021  12:00 AM     Problem: Chronic Diseaes Managment Needs   Priority: Medium     Goal: Work with [facility] Regarding Care Management and Care Coordination Associated with HTN, DM, metastatic prostate cancer, poor oral health   Start Date: 02/22/2021  Recent Progress: On track  Priority: Medium  Note:   Current Barriers:  Chronic Disease Management support and education needs related to poor oral health in a patient with hypertension, diabetes, and metastatic prostate cancer Chronic disease management and care coordination needs related to HTN, DM, and metastatic prostate cancer Unable to independently drive Hazards in home: roof is leaking  RNCM Clinical Goal(s):  Patient will continue to work with RN Care Manager to address care management and care coordination needs related to HTN, DMII, and , metastatic prostate cancer, poor oral health  through collaboration with RN Care manager/LCSW, provider, and care team.  Patient will talk with [place] regarding community assistance to address hazards in the home (leaking roof) Patient will keep all oncology follow-up appointments  Interventions: 1:1 collaboration with primary care provider regarding development and update of comprehensive plan of care as evidenced by provider attestation and co-signature Inter-disciplinary care team collaboration (see longitudinal plan of care) Evaluation of current treatment plan related to  self management and patient's adherence to plan as established by provider   Diabetes:  (Status: Goal on track: YES.) Lab Results  Component Value Date   HGBA1C 6.1 01/29/2021   HGBA1C 6.0 10/24/2020   HGBA1C 6.4 07/26/2020   Lab Results  Component Value Date   MICROALBUR 100 11/21/2014   [place] 68 01/29/2021   CREATININE 1.30 (H) 02/27/2021  Assessed patient's understanding of A1c goal: <7% Reviewed medications with patient and discussed importance of medication adherence        Reviewed prescribed diet with  patient ADA Carb modified diet Counseled on importance of regular laboratory monitoring as prescribed        Encouraged patient to continue checking and recording blood sugar daily and as needed       call provider for findings outside established parameters       Assessed social determinant of health barriers   Hypertension: (Status: Goal on track: YES.) Last practice recorded BP readings:  BP Readings from Last 3 Encounters:  04/02/21 137/77  03/27/21 125/69  02/27/21 112/67  Reviewed medications with patient and discussed importance of medication adherence Encouraged patient to check and record blood pressure at least 3 times per week and to call PCP with any readings outside of recommended range Reinforced low sodium/DASH diet Discussed plans with patient for ongoing care management follow up and provided patient with direct contact information for care management team   Oral Health:  (Status: Goal on track: NO.) Reviewed and discussed medications Previously placed referral to [place] who contacted the patient and provided him with information on the [facility] Confirmed that patient has this information Patient did have an appt but decided to cancel it Discussed insurance dental benefit. Per patient, insurance rep recommended he wait until the beginning of the year to obtain services because coverage would be better Has missing, loose and painful teeth. Has pulled a  couple himself since we last talked Goal is to have them removed and get dentures Readdressed effect of oral health on food intake Able to eat soft foods. Has difficulty with hard or chewy foods Not impacting his ability to eat enough nutritious meals Verbal education provided on the effect of poor oral health on overall health Discussed good oral hygiene practices Provided with RN Care Manager contact number and encouraged to reach out as needed   Oncology:  (Status: New  goal.) Assessment of understanding of oncology diagnosis: Prostate cancer with metastasis to near lymph node Assessed patient understanding of cancer diagnosis and recommended treatment plan Reviewed upcoming provider appointments and treatment appointments: CT guided prostate biopsy scheduled for 04/08/21. Reviewed procedure preparation instructions. Reviewed what will likely happen before and during the procedure. Questions answered.  Assessed available transportation to appointments and treatments. Has consistent/reliable transportation: Yes Assessed support system. Has consistent/reliable family or other support: Yes Nutrition assessment performed Discussed patient's outlook. Has had prostate cancer before and treatments went well. It recently returned. Patient is optimistic.   Recommended talking with LCSW    SDOH Barriers (Status: New goal.)  Patient interviewed and appropriate assessments performed Referred patient to community resources care guide team for assistance with hazards at home: leaking roof Advised patient that he should receive a telephone call from the [facility] team   Patient Goals/Self-Care Activities: Patient will self administer medications as prescribed Patient will attend church or other social activities Patient will continue to perform ADL's independently Patient will continue to perform IADL's independently Patient will call provider office for new concerns or questions       Plan:Telephone follow up appointment with care management team member scheduled for:  04/10/21 with RNCM and The patient has been provided with contact information for the care management team and has been advised to call with any health related questions or concerns.   Chong Sicilian, BSN, RN-BC Embedded Chronic Care Manager [facility] [place] Management Direct Dial: [phone]

## 2021-04-05 ENCOUNTER — Other Ambulatory Visit: Payer: Self-pay | Admitting: Physician Assistant

## 2021-04-08 ENCOUNTER — Ambulatory Visit (HOSPITAL_COMMUNITY)
Admission: RE | Admit: 2021-04-08 | Discharge: 2021-04-08 | Disposition: A | Discharge status: Home / Self Care | Payer: Medicare Other | Source: Ambulatory Visit | Attending: Hematology | Admitting: Hematology

## 2021-04-08 ENCOUNTER — Other Ambulatory Visit: Payer: Self-pay

## 2021-04-08 ENCOUNTER — Encounter (HOSPITAL_COMMUNITY): Payer: Self-pay

## 2021-04-08 DIAGNOSIS — F17210 Nicotine dependence, cigarettes, uncomplicated: Secondary | ICD-10-CM | POA: Insufficient documentation

## 2021-04-08 DIAGNOSIS — C61 Malignant neoplasm of prostate: Secondary | ICD-10-CM | POA: Diagnosis not present

## 2021-04-08 DIAGNOSIS — C755 Malignant neoplasm of aortic body and other paraganglia: Secondary | ICD-10-CM | POA: Diagnosis not present

## 2021-04-08 DIAGNOSIS — R599 Enlarged lymph nodes, unspecified: Secondary | ICD-10-CM | POA: Diagnosis not present

## 2021-04-08 DIAGNOSIS — R590 Localized enlarged lymph nodes: Secondary | ICD-10-CM | POA: Diagnosis not present

## 2021-04-08 DIAGNOSIS — C775 Secondary and unspecified malignant neoplasm of intrapelvic lymph nodes: Secondary | ICD-10-CM | POA: Diagnosis not present

## 2021-04-08 DIAGNOSIS — K5790 Diverticulosis of intestine, part unspecified, without perforation or abscess without bleeding: Secondary | ICD-10-CM | POA: Diagnosis not present

## 2021-04-08 DIAGNOSIS — Z801 Family history of malignant neoplasm of trachea, bronchus and lung: Secondary | ICD-10-CM | POA: Diagnosis not present

## 2021-04-08 LAB — CBC
HCT: 36 % — ABNORMAL LOW (ref 39.0–52.0)
Hemoglobin: 12 g/dL — ABNORMAL LOW (ref 13.0–17.0)
MCH: 31.1 pg (ref 26.0–34.0)
MCHC: 33.3 g/dL (ref 30.0–36.0)
MCV: 93.3 fL (ref 80.0–100.0)
Platelets: 221 10*3/uL (ref 150–400)
RBC: 3.86 MIL/uL — ABNORMAL LOW (ref 4.22–5.81)
RDW: 13.9 % (ref 11.5–15.5)
WBC: 6.3 10*3/uL (ref 4.0–10.5)
nRBC: 0 % (ref 0.0–0.2)

## 2021-04-08 LAB — GLUCOSE, CAPILLARY: Glucose-Capillary: 111 mg/dL — ABNORMAL HIGH (ref 70–99)

## 2021-04-08 MED ORDER — FENTANYL CITRATE (PF) 100 MCG/2ML IJ SOLN
INTRAMUSCULAR | Status: DC | PRN
  Administered 2021-04-08: 25 ug via INTRAVENOUS
  Administered 2021-04-08: 50 ug via INTRAVENOUS

## 2021-04-08 MED ORDER — SODIUM CHLORIDE 0.9 % IV SOLN: INTRAVENOUS | Status: DC

## 2021-04-08 MED ORDER — GELATIN ABSORBABLE 12-7 MM EX MISC
CUTANEOUS | Status: AC
  Filled 2021-04-08: qty 1

## 2021-04-08 MED ORDER — FENTANYL CITRATE (PF) 100 MCG/2ML IJ SOLN
INTRAMUSCULAR | Status: AC
  Filled 2021-04-08: qty 2

## 2021-04-08 MED ORDER — MIDAZOLAM HCL 2 MG/2ML IJ SOLN
INTRAMUSCULAR | Status: DC | PRN
  Administered 2021-04-08: .5 mg via INTRAVENOUS
  Administered 2021-04-08: 1 mg via INTRAVENOUS

## 2021-04-08 MED ORDER — LIDOCAINE-EPINEPHRINE 1 %-1:100000 IJ SOLN
INTRAMUSCULAR | Status: AC
  Filled 2021-04-08: qty 1

## 2021-04-08 MED ORDER — MIDAZOLAM HCL 2 MG/2ML IJ SOLN
INTRAMUSCULAR | Status: AC
  Filled 2021-04-08: qty 4

## 2021-04-08 NOTE — Procedures (Signed)
Pre procedural Dx: Left pelvic lymphadenopathy  Post procedural Dx: Same  Technically successful CT guided biopsy of dominant left pelvic lymph node.    EBL: None.   Complications: None immediate.   Ronny Bacon, MD Pager #: [phone]

## 2021-04-08 NOTE — H&P (Signed)
Chief Complaint: Patient was seen in consultation today for left iliac lymph node biopsy  at the request of Katragadda,Sreedhar  Referring Physician(s): Katragadda,Sreedhar  Supervising Physician: Sandi Mariscal  Patient Status: [facility] - Out-pt  History of Present Illness: Andrew [name] is a 75 y.o. 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  CTAP on 08/26/2017 shows enlarged left pelvic sidewall lymph node 2.2 cm.  CT 03/07/21: IMPRESSION: 1. Interval enlargement of a lymph node at the left iliac bifurcation, consistent with worsened nodal metastatic disease. 2. Prostate brachytherapy pellets. 3. Diverticulosis without evidence of diverticulitis. 4. Coronary artery disease.  Scheduled today for left iliac node biopsy  Past Medical History:  Diagnosis Date   Anemia    BPH (benign prostatic hyperplasia)    Sees Dr [name]   Cataract    Diabetes mellitus without complication ([place])    Hyperlipidemia    Hypertension    Low serum vitamin D    Prostate cancer ([place]) 2008   w/seed implantation and radiation    Past Surgical History:  Procedure Laterality Date   COLONOSCOPY  2015,2018   PROSTATE SURGERY  2008   seed implant    Allergies: Patient has no known allergies.  Medications: Prior to Admission medications   Medication Sig Start Date End Date Taking? Authorizing Provider  acetaminophen (TYLENOL) 500 MG tablet Take 1,000 mg by mouth every 6 (six) hours as needed for moderate pain or headache.   Yes [provider]  alfuzosin (UROXATRAL) 10 MG 24 hr tablet Take 1 tablet (10 mg total) by mouth  at bedtime. 02/27/21  Yes McKenzie, Candee Furbish, MD  aspirin 81 MG tablet Take 81 mg by mouth daily.   Yes [provider]  atorvastatin (LIPITOR) 40 MG tablet TAKE 1 TABLET BY MOUTH  DAILY 02/13/21  Yes Stacks, Cletus Gash, MD  cholecalciferol (VITAMIN D) 1000 UNITS tablet Take 1,000 Units by mouth daily.    Yes [provider]  enzalutamide (XTANDI) 40 MG tablet Take 160 mg by mouth daily.   Yes [provider]  lisinopril-hydrochlorothiazide (ZESTORETIC) 20-25 MG tablet TAKE 1 TABLET BY MOUTH  DAILY 02/13/21  Yes [name], MD  Melatonin 3 MG CAPS Take 3 mg by mouth at bedtime as needed (sleep).   Yes [provider]  metFORMIN (GLUCOPHAGE) 1000 MG tablet Take 1 tablet (1,000 mg total) by mouth 2 (two) times daily with a meal. 01/29/21  Yes Stacks, Cletus Gash, MD  clotrimazole-betamethasone (LOTRISONE) cream Apply 1 application topically 2 (two) times daily. Patient not taking: No sig reported 02/08/20   Cleon Gustin, MD  enzalutamide Gillermina Phy) 80 MG tablet Take 2 tablets (160 mg total) by mouth daily. Patient not taking: Reported on 04/04/2021 07/19/20   Cleon Gustin, MD  glucose blood test strip 1 each by Other route as needed for other. Use as instructed to test blood sugar daily.  One Touch Verio Test Strips DX: E11.65 10/13/19   [name], MD  tamsulosin (FLOMAX) 0.4 MG CAPS capsule Take 1 capsule (0.4 mg total) by mouth daily. Patient not taking: Reported on 04/04/2021 07/26/20   [name], MD  tizanidine (  ZANAFLEX) 6 MG capsule Take 1 capsule (6 mg total) by mouth 3 (three) times daily as needed for muscle spasms. Patient not taking: Reported on 04/04/2021 01/16/21   [name], MD  traZODone (DESYREL) 150 MG tablet Use from 1/3 to 1 tablet nightly as needed for sleep. Patient not taking: No sig reported 07/26/20   [name], MD     Family History  Problem Relation Age of Onset   Diabetes Mother    Heart disease Mother    Kidney  disease Mother        DIALYSIS   Emphysema Father    Deep vein thrombosis Sister    Kidney disease Sister    Lung cancer Sister    Colon polyps Brother    Hypertension Brother    Gout Brother    Colon cancer Neg Hx    Esophageal cancer Neg Hx    Rectal cancer Neg Hx    Prostate cancer Neg Hx     Social History   Socioeconomic History   Marital status: Married    Spouse name: Tabiona   Number of children: 2   Years of education: 12   Highest education level: 12th grade  Occupational History   Occupation: [name]    Comment: Retired Geologist, engineering  Tobacco Use   Smoking status: Every Day    Packs/day: 1.00    Years: 55.00    Pack years: 55.00    Types: Cigarettes    Start date: 06/30/1965   Smokeless tobacco: Never   Tobacco comments:    Has tried nicotine replacement gum  Vaping Use   Vaping Use: Never used  Substance and Sexual Activity   Alcohol use: No   Drug use: No   Sexual activity: Yes  Other Topics Concern   Not on file  Social History Narrative   Retired, lives at home with wife [name], two grown children, enjoys gardening     Social Determinants of Radio broadcast assistant Strain: Low Risk    Difficulty of Paying Living Expenses: Not hard at all  Food Insecurity: No Food Insecurity   Worried About Charity fundraiser in the Last Year: Never true   Arboriculturist in the Last Year: Never true  Transportation Needs: No Transportation Needs   Lack of Transportation (Medical): No   Lack of Transportation (Non-Medical): No  Physical Activity: Inactive   Days of Exercise per Week: 0 days   Minutes of Exercise per Session: 0 min  Stress: No Stress Concern Present   Feeling of Stress : Not at all  Social Connections: Socially Integrated   Frequency of Communication with Friends and Family: More than three times a week   Frequency of Social Gatherings with Friends and Family: More than three times a week   Attends Religious Services: More than 4  times per year   Active Member of [employer] or Organizations: Yes   Attends Music therapist: More than 4 times per year   Marital Status: Married    Review of Systems: A 12 point ROS discussed and pertinent positives are indicated in the HPI above.  All other systems are negative.  Review of Systems  Constitutional:  Negative for activity change, fatigue and fever.  Respiratory:  Negative for cough and shortness of breath.   Cardiovascular:  Negative for chest pain.  Gastrointestinal:  Negative for blood in stool.  Neurological:  Negative for weakness.  Psychiatric/Behavioral:  Negative for behavioral problems  and confusion.    Vital Signs: BP (!) 142/70   Pulse 67   Temp 98.3 F (36.8 C) (Oral)   Resp 15   Ht 5' 7.5" (1.715 m)   Wt 202 lb (91.6 kg)   SpO2 99%   BMI 31.17 kg/m   Physical Exam HENT:     Mouth/Throat:     Mouth: Mucous membranes are moist.  Cardiovascular:     Rate and Rhythm: Normal rate and regular rhythm.     Heart sounds: Normal heart sounds.  Pulmonary:     Breath sounds: Normal breath sounds.  Abdominal:     Palpations: Abdomen is soft.  Musculoskeletal:        General: Normal range of motion.  Skin:    General: Skin is warm.  Neurological:     Mental Status: He is alert and oriented to person, place, and time.  Psychiatric:        Behavior: Behavior normal.    Imaging: No results found.  Labs:  CBC: Recent Labs    07/26/20 1112 10/24/20 1017 01/29/21 1001 04/08/21 0927  WBC 6.8 6.2 6.6 6.3  HGB 11.1* 11.8* 11.9* 12.0*  HCT 31.7* 35.5* 35.8* 36.0*  PLT 213 226 199 221    COAGS: No results for input(s): INR, APTT in the last 8760 hours.  BMP: Recent Labs    04/10/20 1025 07/26/20 1112 10/24/20 1017 01/29/21 1001 02/27/21 1641  NA 141 140 140 137 139  K 4.2 3.8 3.8 4.1 3.8  CL 104 102 100 100 100  CO2 24 24 22 23 25   GLUCOSE 91 105* 107* 111* 102*  BUN 21 23 20 19 13   CALCIUM 9.0 8.9 9.2 9.1 9.2   CREATININE 1.43* 1.53* 1.47* 1.50* 1.30*  GFRNONAA 48* 44*  --   --   --   GFRAA 55* 51*  --   --   --     LIVER FUNCTION TESTS: Recent Labs    04/10/20 1025 07/26/20 1112 10/24/20 1017 01/29/21 1001  BILITOT 0.4 0.3 0.3 0.5  AST 26 21 23 19   ALT 22 17 15 11   ALKPHOS 96 104 107 89  PROT 6.5 6.7 6.8 6.4  ALBUMIN 3.7 3.8 3.8 3.8    TUMOR MARKERS: No results for input(s): AFPTM, CEA, CA199, CHROMGRNA in the last 8760 hours.  Assessment and Plan:  Prostate cancer history Rising PSA CT revealing enlarged L iliac LN Scheduled now for biopsy of same Risks and benefits of left iliac lymph node biopsy was discussed with the patient and/or patient's family including, but not limited to bleeding, infection, damage to adjacent structures or low yield requiring additional tests.  All of the questions were answered and there is agreement to proceed. Consent signed and in chart.    Thank you for this interesting consult.  I greatly enjoyed meeting NAFTOLI PENNY and look forward to participating in their care.  A copy of this report was sent to the requesting provider on this date.  Electronically Signed: Lavonia Drafts, PA-C 04/08/2021, 9:48 AM   I spent a total of  30 Minutes   in face to face in clinical consultation, greater than 50% of which was counseling/coordinating care for left iliac LN bx

## 2021-04-10 ENCOUNTER — Encounter: Payer: Self-pay | Admitting: *Deleted

## 2021-04-10 ENCOUNTER — Ambulatory Visit: Payer: Medicare Other | Admitting: *Deleted

## 2021-04-10 DIAGNOSIS — C61 Malignant neoplasm of prostate: Secondary | ICD-10-CM

## 2021-04-10 DIAGNOSIS — E118 Type 2 diabetes mellitus with unspecified complications: Secondary | ICD-10-CM

## 2021-04-10 DIAGNOSIS — I10 Essential (primary) hypertension: Secondary | ICD-10-CM

## 2021-04-10 LAB — SURGICAL PATHOLOGY

## 2021-04-10 NOTE — Chronic Care Management (AMB) (Signed)
Chronic Care Management   CCM RN Visit Note  04/10/2021 Name: Andrew [name] MRN: [id number] DOB: March 01, 1946  Subjective: Andrew [name] is a 75 y.o. year old male who is a primary care patient of Andrew [name], Andrew Gash, MD. The care management team was consulted for assistance with disease management and care coordination needs.    Engaged with patient by telephone for follow up visit in response to provider referral for case management and/or care coordination services.   Consent to Services:  The patient was given information about Chronic Care Management services, agreed to services, and gave verbal consent prior to initiation of services.  Please see initial visit note for detailed documentation.   Patient agreed to services and verbal consent obtained.   Assessment: Review of patient past medical history, allergies, medications, health status, including review of consultants reports, laboratory and other test data, was performed as part of comprehensive evaluation and provision of chronic care management services.   SDOH (Social Determinants of Health) assessments and interventions performed:    CCM Care Plan  No Known Allergies  Outpatient Encounter Medications as of 04/10/2021  Medication Sig Note   acetaminophen (TYLENOL) 500 MG tablet Take 1,000 mg by mouth every 6 (six) hours as needed for moderate pain or headache.    alfuzosin (UROXATRAL) 10 MG 24 hr tablet Take 1 tablet (10 mg total) by mouth at bedtime.    aspirin 81 MG tablet Take 81 mg by mouth daily.    atorvastatin (LIPITOR) 40 MG tablet TAKE 1 TABLET BY MOUTH  DAILY    cholecalciferol (VITAMIN D) 1000 UNITS tablet Take 1,000 Units by mouth daily.     clotrimazole-betamethasone (LOTRISONE) cream Apply 1 application topically 2 (two) times daily. (Patient not taking: No sig reported)    enzalutamide (XTANDI) 40 MG tablet Take 160 mg by mouth daily.    glucose blood test strip 1 each by Other route as needed for other. Use as  instructed to test blood sugar daily.  One Touch Verio Test Strips DX: E11.65 10/17/2019: One Touch Verio   lisinopril-hydrochlorothiazide (ZESTORETIC) 20-25 MG tablet TAKE 1 TABLET BY MOUTH  DAILY    Melatonin 3 MG CAPS Take 3 mg by mouth at bedtime as needed (sleep).    metFORMIN (GLUCOPHAGE) 1000 MG tablet Take 1 tablet (1,000 mg total) by mouth 2 (two) times daily with a meal.    tamsulosin (FLOMAX) 0.4 MG CAPS capsule Take 1 capsule (0.4 mg total) by mouth daily. (Patient not taking: Reported on 04/04/2021)    tizanidine (ZANAFLEX) 6 MG capsule Take 1 capsule (6 mg total) by mouth 3 (three) times daily as needed for muscle spasms. (Patient not taking: Reported on 04/04/2021)    traZODone (DESYREL) 150 MG tablet Use from 1/3 to 1 tablet nightly as needed for sleep. (Patient not taking: No sig reported)    No facility-administered encounter medications on file as of 04/10/2021.    Patient Active Problem List   Diagnosis Date Noted   Aortic atherosclerosis ([place]) 01/18/2021   Primary insomnia 07/26/2020   Sacroiliitis ([place]) 07/26/2020   Nocturia 02/08/2020   Nonspecific abnormal electrocardiogram (ECG) (EKG) 07/18/2019   Type 2 diabetes mellitus with complication, without long-term current use of insulin ([place]) 07/18/2019   Educated about COVID-19 virus infection 07/18/2019   SOB (shortness of breath) 12/10/2016   Erectile dysfunction due to arterial insufficiency 10/30/2016   Polyp of colon 10/30/2016   Peripheral vascular insufficiency ([place]) 06/13/2016   Vitamin D deficiency 08/24/2015  Prostate cancer metastatic to intrapelvic lymph node ([place]) 04/07/2014   Diabetes mellitus type 2, uncontrolled 09/14/2010   Benign prostatic hyperplasia with urinary obstruction 09/14/2010   Essential hypertension 09/14/2010   Hyperlipidemia associated with type 2 diabetes mellitus ([place]) 09/14/2010   Tobacco abuse 09/14/2010    Conditions to be addressed/monitored:HTN, DMII, and prostate  cancer  Care Plan : [name]  Updates made by Ilean China, RN since 04/10/2021 12:00 AM     Problem: Chronic Diseaes Managment Needs   Priority: Medium     Goal: Work with [facility] Regarding Care Management and Care Coordination Associated with HTN, DM, metastatic prostate cancer, poor oral health   Start Date: 02/22/2021  This Visit's Progress: On track  Recent Progress: On track  Priority: Medium  Note:   Current Barriers:  Chronic Disease Management support and education needs related to poor oral health in a patient with hypertension, diabetes, and metastatic prostate cancer Chronic disease management and care coordination needs related to HTN, DM, and metastatic prostate cancer Unable to independently drive Hazards in home: roof is leaking  RNCM Clinical Goal(s):  Patient will continue to work with RN Care Manager to address care management and care coordination needs related to HTN, DMII, and , metastatic prostate cancer, poor oral health  through collaboration with RN Care manager/LCSW, provider, and care team.  Patient will talk with [facility] Care Guides regarding community assistance to address hazards in the home (leaking roof) Patient will keep all oncology follow-up appointments  Interventions: 1:1 collaboration with primary care provider regarding development and update of comprehensive plan of care as evidenced by provider attestation and co-signature Inter-disciplinary care team collaboration (see longitudinal plan of care) Evaluation of current treatment plan related to  self management and patient's adherence to plan as established by provider   Diabetes:  (Status: Condition stable. Not addressed this visit.) Lab Results  Component Value Date   HGBA1C 6.1 01/29/2021   HGBA1C 6.0 10/24/2020   HGBA1C 6.4 07/26/2020   Lab Results  Component Value Date   MICROALBUR 100 11/21/2014   [place] 68 01/29/2021   CREATININE 1.30 (H) 02/27/2021  Assessed patient's  understanding of A1c goal: <7% Reviewed medications with patient and discussed importance of medication adherence        Reviewed prescribed diet with patient ADA Carb modified diet Counseled on importance of regular laboratory monitoring as prescribed        Encouraged patient to continue checking and recording blood sugar daily and as needed       call provider for findings outside established parameters       Assessed social determinant of health barriers   Hypertension: (Status: Condition stable. Not addressed this visit.) Last practice recorded BP readings:  BP Readings from Last 3 Encounters:  04/02/21 137/77  03/27/21 125/69  02/27/21 112/67  Reviewed medications with patient and discussed importance of medication adherence Encouraged patient to check and record blood pressure at least 3 times per week and to call PCP with any readings outside of recommended range Reinforced low sodium/DASH diet Discussed plans with patient for ongoing care management follow up and provided patient with direct contact information for care management team   Oral Health:  (Status: Condition stable. Not addressed this visit.) Reviewed and discussed medications Previously placed referral to [facility] who contacted the patient and provided him with information on the [facility] Confirmed that patient has this information Patient did have an appt but decided to  cancel it Discussed insurance dental benefit. Per patient, insurance rep recommended he wait until the beginning of the year to obtain services because coverage would be better Has missing, loose and painful teeth. Has pulled a couple himself since we last talked Goal is to have them removed and get dentures Readdressed effect of oral health on food intake Able to eat soft foods. Has difficulty with hard or chewy foods Not impacting his ability to eat enough nutritious meals Verbal education provided on the effect of  poor oral health on overall health Discussed good oral hygiene practices Provided with RN Care Manager contact number and encouraged to reach out as needed   Oncology:  (Status: Goal on track: YES.) Discussed recent CT guided prostate biopsy. EMR notes reviewed. Per patient, still having some soreness but it's not getting worse.  Verified available transportation to appointments and treatments. Has consistent/reliable transportation: Yes Assessed support system. Has consistent/reliable family or other support: Yes Nutrition assessment performed Reviewed upcoming appt with Dr Delton Coombes with the [facility] on 10/17 to review results Encouraged to reach out to [facility] as needed    SDOH Barriers (Status: Condition stable. Not addressed this visit.)  Patient interviewed and appropriate assessments performed Referred patient to community resources care guide team for assistance with hazards at home: leaking roof Advised patient that he should receive a telephone call from the [facility] team   Patient Goals/Self-Care Activities: Patient will self administer medications as prescribed Patient will attend church or other social activities Patient will continue to perform ADL's independently Patient will continue to perform IADL's independently Patient will call provider office for new concerns or questions       Plan:Telephone follow up appointment with care management team member scheduled for:  04/24/21 with RNCM and The patient has been provided with contact information for the care management team and has been advised to call with any health related questions or concerns.   Chong Sicilian, BSN, RN-BC Embedded Chronic Care Manager [facility] [place] Management Direct Dial: [phone]

## 2021-04-10 NOTE — Patient Instructions (Signed)
Visit Information  PATIENT GOALS:  Goals Addressed             This Visit's Progress    RNCM: [place] My Quality of Life (prostate cancer dx)   On track    Timeframe:  Long-Range Goal Priority:  Medium Start Date:  04/04/21                           Expected End Date:    04/04/22                   Follow Up Date 04/24/21    Follow-up with oncologist on 10/17 to discuss CT biopsy results Talk with family/friends daily Get outside daily Practice hobbies as often as possible Increase activity level as tolerated with a goal of 150 minutes a week Take medications as prescribed Call RN Care Manager as needed [phone] Consider talking with LCSW about anxiety, depression, etc associated with prostate cancer diagnosis   Why is this important?   Having a long-term illness can be scary.  It can also be stressful for you and your caregiver.  These steps may help.    Notes:      RNCM: Track and Manage My Blood Pressure-Hypertension       Timeframe:  Long-Range Goal Priority:  Medium Start Date:  11/29/20                           Expected End Date:  11/29/21                      Follow Up Date 04/24/21    Check and record blood pressure at least 3 times per week Call PCP with any readings outside of recommended range [phone] Take medication as directed Keep all medical appointments Read food labels for sodium content and do not add extra salt to food Call [place] as needed [phone]   Why is this important?   You won't feel high blood pressure, but it can still hurt your blood vessels.  High blood pressure can cause heart or kidney problems. It can also cause a stroke.  Making lifestyle changes like losing a little weight or eating less salt will help.  Checking your blood pressure at home and at different times of the day can help to control blood pressure.  If the doctor prescribes medicine remember to take it the way the doctor ordered.  Call the office if  you cannot afford the medicine or if there are questions about it.     Notes:         The patient verbalized understanding of instructions, educational materials, and care plan provided today and declined offer to receive copy of patient instructions, educational materials, and care plan.    Plan:Telephone follow up appointment with care management team member scheduled for:  04/24/21 with RNCM and The patient has been provided with contact information for the care management team and has been advised to call with any health related questions or concerns.   Chong Sicilian, BSN, RN-BC Embedded Chronic Care Manager [facility] / [place] Management Direct Dial: [phone]

## 2021-04-13 NOTE — Progress Notes (Addendum)
[place], [place] [address]   CLINIC:  Medical Oncology/Hematology  PCP:  [name], MD [address] [place] [address] [phone]   REASON FOR VISIT:  Follow-up for metastatic castration refractory prostate cancer  PRIOR THERAPY: neoadjuvant hormonal therapy, EBRT followed by brachytherapy with iodine 125 seeds in 2010  NGS Results: not done  CURRENT THERAPY: Lupron injection (started 08/10/2019) and enzalutamide 160 mg daily (started 04/17/2020)  BRIEF ONCOLOGIC HISTORY:  Oncology History   No history exists.    CANCER STAGING: Cancer Staging Prostate cancer metastatic to intrapelvic lymph node [facility]) Staging form: Prostate, [name] 7th Edition - Clinical stage from 04/02/2021: rT2b, N1, PSA: 10 to 19, Gleason 7 - Unsigned   INTERVAL HISTORY:  Andrew [name], a 75 y.o. male, returns for routine follow-up of his metastatic castration refractory prostate cancer. Andrew [name] was last seen on 10//09/2020.   Today he reports feeling well. He has not finished his final bottle of enzalutamide yet and is still taking 160 mg daily. He denies difficulty urinating, hematuria, and new pains.   REVIEW OF SYSTEMS:  Review of Systems  Constitutional:  Negative for appetite change (60%) and fatigue (50%).  Respiratory:  Positive for cough and shortness of breath.   Genitourinary:  Negative for difficulty urinating and hematuria.   Musculoskeletal:  Negative for arthralgias and myalgias.  All other systems reviewed and are negative.  PAST MEDICAL/SURGICAL HISTORY:  Past Medical History:  Diagnosis Date   Anemia    BPH (benign prostatic hyperplasia)    Sees Dr [name]   Cataract    Diabetes mellitus without complication ([facility])    Hyperlipidemia    Hypertension    Low serum vitamin D    Prostate cancer ([place]) 2008   w/seed implantation and radiation   Past Surgical History:  Procedure Laterality Date   COLONOSCOPY  2015,2018    PROSTATE SURGERY  2008   seed implant    SOCIAL HISTORY:  Social History   Socioeconomic History   Marital status: Married    Spouse name: Andrew [name]   Number of children: 2   Years of education: 12   Highest education level: 12th grade  Occupational History   Occupation: [name]    Comment: Retired Geologist, engineering  Tobacco Use   Smoking status: Every Day    Packs/day: 1.00    Years: 55.00    Pack years: 55.00    Types: Cigarettes    Start date: 06/30/1965   Smokeless tobacco: Never   Tobacco comments:    Has tried nicotine replacement gum  Vaping Use   Vaping Use: Never used  Substance and Sexual Activity   Alcohol use: No   Drug use: No   Sexual activity: Yes  Other Topics Concern   Not on file  Social History Narrative   Retired, lives at home with wife Andrew [name], two grown children, enjoys gardening     Social Determinants of Radio broadcast assistant Strain: Low Risk    Difficulty of Paying Living Expenses: Not hard at all  Food Insecurity: No Food Insecurity   Worried About Charity fundraiser in the Last Year: Never true   Arboriculturist in the Last Year: Never true  Transportation Needs: No Transportation Needs   Lack of Transportation (Medical): No   Lack of Transportation (Non-Medical): No  Physical Activity: Inactive   Days of Exercise per Week: 0 days   Minutes  of Exercise per Session: 0 min  Stress: No Stress Concern Present   Feeling of Stress : Not at all  Social Connections: Socially Integrated   Frequency of Communication with Friends and Family: More than three times a week   Frequency of Social Gatherings with Friends and Family: More than three times a week   Attends Religious Services: More than 4 times per year   Active Member of [employer] or Organizations: Yes   Attends Music therapist: More than 4 times per year   Marital Status: Married  Human resources officer Violence: Not At Risk   Fear of Current or Ex-Partner: No   Emotionally  Abused: No   Physically Abused: No   Sexually Abused: No    FAMILY HISTORY:  Family History  Problem Relation Age of Onset   Diabetes Mother    Heart disease Mother    [name] disease Mother        DIALYSIS   Emphysema Father    Deep vein thrombosis Sister    [name] disease Sister    Lung cancer Sister    Colon polyps Brother    Hypertension Brother    Gout Brother    Colon cancer Neg Hx    Esophageal cancer Neg Hx    Rectal cancer Neg Hx    Prostate cancer Neg Hx     CURRENT MEDICATIONS:  Current Outpatient Medications  Medication Sig Dispense Refill   acetaminophen (TYLENOL) 500 MG tablet Take 1,000 mg by mouth every 6 (six) hours as needed for moderate pain or headache.     alfuzosin (UROXATRAL) 10 MG 24 hr tablet Take 1 tablet (10 mg total) by mouth at bedtime. 30 tablet 11   aspirin 81 MG tablet Take 81 mg by mouth daily.     atorvastatin (LIPITOR) 40 MG tablet TAKE 1 TABLET BY MOUTH  DAILY 90 tablet 3   cholecalciferol (VITAMIN D) 1000 UNITS tablet Take 1,000 Units by mouth daily.      clotrimazole-betamethasone (LOTRISONE) cream Apply 1 application topically 2 (two) times daily. (Patient not taking: No sig reported) 30 g 0   enzalutamide (XTANDI) 40 MG tablet Take 160 mg by mouth daily.     glucose blood test strip 1 each by Other route as needed for other. Use as instructed to test blood sugar daily.  One Touch Verio Test Strips DX: E11.65 100 each 11   lisinopril-hydrochlorothiazide (ZESTORETIC) 20-25 MG tablet TAKE 1 TABLET BY MOUTH  DAILY 90 tablet 3   Melatonin 3 MG CAPS Take 3 mg by mouth at bedtime as needed (sleep).     metFORMIN (GLUCOPHAGE) 1000 MG tablet Take 1 tablet (1,000 mg total) by mouth 2 (two) times daily with a meal. 180 tablet 3   tamsulosin (FLOMAX) 0.4 MG CAPS capsule Take 1 capsule (0.4 mg total) by mouth daily. (Patient not taking: Reported on 04/04/2021) 90 capsule 3   tizanidine (ZANAFLEX) 6 MG capsule Take 1 capsule (6 mg total) by mouth 3  (three) times daily as needed for muscle spasms. (Patient not taking: Reported on 04/04/2021) 90 capsule 1   traZODone (DESYREL) 150 MG tablet Use from 1/3 to 1 tablet nightly as needed for sleep. (Patient not taking: No sig reported) 90 tablet 3   No current facility-administered medications for this visit.    ALLERGIES:  No Known Allergies  PHYSICAL EXAM:  Performance status (ECOG): 1 - Symptomatic but completely ambulatory  There were no vitals filed for this visit. Wt Readings  from Last 3 Encounters:  04/08/21 202 lb (91.6 kg)  04/02/21 205 lb 14.6 oz (93.4 kg)  03/27/21 207 lb 9.6 oz (94.2 kg)   Physical Exam Vitals reviewed.  Constitutional:      Appearance: Normal appearance.  Cardiovascular:     Rate and Rhythm: Normal rate and regular rhythm.     Pulses: Normal pulses.     Heart sounds: Normal heart sounds.  Pulmonary:     Effort: Pulmonary effort is normal.     Breath sounds: Normal breath sounds.  Neurological:     General: No focal deficit present.     Mental Status: He is alert and oriented to person, place, and time.  Psychiatric:        Mood and Affect: Mood normal.        Behavior: Behavior normal.     LABORATORY DATA:  I have reviewed the labs as listed.  CBC Latest Ref Rng & Units 04/08/2021 01/29/2021 10/24/2020  WBC 4.0 - 10.5 K/uL 6.3 6.6 6.2  Hemoglobin 13.0 - 17.0 g/dL 12.0(L) 11.9(L) 11.8(L)  Hematocrit 39.0 - 52.0 % 36.0(L) 35.8(L) 35.5(L)  Platelets 150 - 400 K/uL 221 199 226   CMP Latest Ref Rng & Units 02/27/2021 01/29/2021 10/24/2020  Glucose 65 - 99 mg/dL 102(H) 111(H) 107(H)  BUN 8 - 27 mg/dL _0 Creatinine 0.76 - 1.27 mg/dL 1.30(H) 1.50(H) 1.47(H)  Sodium 134 - 144 mmol/L 139 137 140  Potassium 3.5 - 5.2 mmol/L 3.8 4.1 3.8  Chloride 96 - 106 mmol/L 100 100 100  CO2 20 - 29 mmol/L _1 Calcium 8.6 - 10.2 mg/dL 9.2 9.1 9.2  Total Protein 6.0 - 8.5 g/dL - 6.4 6.8  Total Bilirubin 0.0 - 1.2 mg/dL - 0.5 0.3  Alkaline Phos 44 - 121  IU/L - 89 107  AST 0 - 40 IU/L - 19 23  ALT 0 - 44 IU/L - 11 15    DIAGNOSTIC IMAGING:  I have independently reviewed the scans and discussed with the patient. CT Biopsy  Result Date: 04/08/2021 INDICATION: History of prostate cancer, now with enlarging left external iliac change lymph node worrisome for metastatic disease. Please perform CT-guided biopsy for tissue diagnostic purposes. EXAM: CT-GUIDED BIOPSY OF ENLARGING LEFT EXTERNAL ILIAC CHAIN LYMPH NODE COMPARISON:  CT abdomen pelvis-03/07/2021; 03/09/2020 MEDICATIONS: None. ANESTHESIA/SEDATION: Fentanyl 75 mcg IV; Versed 1.5 mg IV Sedation time: 18 minutes; The patient was continuously monitored during the procedure by the interventional radiology nurse under my direct supervision. CONTRAST:  None. COMPLICATIONS: None immediate. PROCEDURE: Informed consent was obtained from the patient following an explanation of the procedure, risks, benefits and alternatives. A time out was performed prior to the initiation of the procedure. The patient was positioned supine on the CT table and a limited CT was performed for procedural planning demonstrating unchanged size and appearance of the approximately 3.6 x 2.8 cm enlarging left external iliac chain lymph node (image 25, series 3. The procedure was planned. The operative site was prepped and draped in the usual sterile fashion. Appropriate trajectory was confirmed with a 22 gauge spinal needle after the adjacent tissues were anesthetized with 1% Lidocaine with epinephrine. Under intermittent CT guidance, a 17 gauge coaxial needle was advanced into the peripheral aspect of the mass. Appropriate positioning was confirmed and 6 core needle biopsy samples were obtained with an 18 gauge core needle biopsy device. The co-axial needle was removed following administration of a Gel-Foam slurry and superficial hemostasis was  achieved with manual compression. A limited postprocedural CT was negative for hemorrhage or  additional complication. A dressing was placed. The patient tolerated the procedure well without immediate postprocedural complication. IMPRESSION: Technically successful CT guided core needle biopsy of enlarging left external iliac chain lymph node. Electronically Signed   By: Sandi Mariscal M.D.   On: 04/08/2021 15:01     ASSESSMENT:  Metastatic castration refractory prostate cancer to left iliac lymph node: - History of prostate adenocarcinoma, T2b, Gleason 7, PSA 16.6 - Treated with neoadjuvant hormonal therapy, EBRT followed by brachytherapy with iodine 125 seeds in 2010 - Rising PSA levels in February 2019 - Bone scan on 08/10/2017 negative.  CTAP on 08/26/2017 shows enlarged left pelvic sidewall lymph node 2.2 cm. - Lupron initiated on 08/10/2019. - CTAP on 03/09/2020 with left internal/external iliac lymph node measuring 2.1 x 2.8 cm (2.1 x 2.2 cm), craniocaudal 4.1 cm, previously 3.4 cm. - Enzalutamide to 160 mg daily started on 04/17/2020. - PSA on 02/20/2021 (5.2), 07/11/2020 (2.0), 02/08/2020 (3.1) - CTAP on 03/07/2021 with left iliac bifurcation lymph node measuring 3.5 x 2.6 cm (previously 2.8 x 2.0 cm) - Bone scan on 03/07/2021 negative. - Left pelvic lymph node biopsy on 04/08/2021, metastatic prostate cancer.  2.  Social/family history: - He lives at home with his wife.  He is able to do household activities including working in the garden and Film/video editor.  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 - He is current active smoker, 1 pack/day for 55 years. - Sister died of lung cancer.   PLAN:  Metastatic castration refractory prostate cancer to the left iliac lymph node: - We reviewed biopsy results of the left pelvic lymph node which was consistent with metastatic prostate cancer. - We will send germline testing for PARPi therapy. - We will also send NGS testing (MSI, TMB, HRRm). - We reviewed labs from 04/02/2021 which showed testosterone was less than 3.   PSA improved to 3.2. - He will finish the bottle of enzalutamide and stop it. - I recommend a PSMA PET CT scan.  If there is no other site of uptake, will consider radiation therapy to the lymph node. - If there is systemic disease, will consider docetaxel.  We have discussed the various treatment options in detail. - RTC 2 weeks for follow-up.  2.  Smoking history: - He has 55-pack-year smoking history and his current active smoker. - We talked about low-dose lung cancer screening scans for which he is agreeable.  CT scan is scheduled for next Wednesday.  3.  Loss of appetite: - He reports loss of appetite on and off.  No weight loss. - We will reevaluate at next visit.  We will see if his appetite improves after discontinuation of enzalutamide.  If not we will consider appetite stimulant.   Orders placed this encounter:  No orders of the defined types were placed in this encounter.  Total time spent is 30 minutes with more than 50% of the time spent face-to-face discussing biopsy results, further management options, counseling and coordination of care.  Derek Jack, MD [place] [phone]   I, Thana Ates, am acting as a scribe for Dr. Derek Jack.  I, Derek Jack MD, have reviewed the above documentation for accuracy and completeness, and I agree with the above.

## 2021-04-15 ENCOUNTER — Inpatient Hospital Stay (HOSPITAL_BASED_OUTPATIENT_CLINIC_OR_DEPARTMENT_OTHER): Payer: Medicare Other | Admitting: Hematology

## 2021-04-15 ENCOUNTER — Inpatient Hospital Stay (HOSPITAL_COMMUNITY): Payer: Medicare Other

## 2021-04-15 ENCOUNTER — Encounter (HOSPITAL_COMMUNITY): Payer: Self-pay

## 2021-04-15 ENCOUNTER — Other Ambulatory Visit: Payer: Self-pay

## 2021-04-15 VITALS — BP 138/73 | HR 64 | Temp 96.7°F | Resp 20 | Wt 203.5 lb

## 2021-04-15 DIAGNOSIS — Z7982 Long term (current) use of aspirin: Secondary | ICD-10-CM | POA: Diagnosis not present

## 2021-04-15 DIAGNOSIS — C61 Malignant neoplasm of prostate: Secondary | ICD-10-CM | POA: Diagnosis not present

## 2021-04-15 DIAGNOSIS — R0602 Shortness of breath: Secondary | ICD-10-CM | POA: Diagnosis not present

## 2021-04-15 DIAGNOSIS — F17210 Nicotine dependence, cigarettes, uncomplicated: Secondary | ICD-10-CM | POA: Diagnosis not present

## 2021-04-15 DIAGNOSIS — C775 Secondary and unspecified malignant neoplasm of intrapelvic lymph nodes: Secondary | ICD-10-CM

## 2021-04-15 DIAGNOSIS — C7951 Secondary malignant neoplasm of bone: Secondary | ICD-10-CM | POA: Diagnosis not present

## 2021-04-15 DIAGNOSIS — Z7984 Long term (current) use of oral hypoglycemic drugs: Secondary | ICD-10-CM | POA: Diagnosis not present

## 2021-04-15 DIAGNOSIS — Z801 Family history of malignant neoplasm of trachea, bronchus and lung: Secondary | ICD-10-CM | POA: Diagnosis not present

## 2021-04-15 DIAGNOSIS — I10 Essential (primary) hypertension: Secondary | ICD-10-CM | POA: Diagnosis not present

## 2021-04-15 DIAGNOSIS — R42 Dizziness and giddiness: Secondary | ICD-10-CM | POA: Diagnosis not present

## 2021-04-15 DIAGNOSIS — E119 Type 2 diabetes mellitus without complications: Secondary | ICD-10-CM | POA: Diagnosis not present

## 2021-04-15 DIAGNOSIS — Z79899 Other long term (current) drug therapy: Secondary | ICD-10-CM | POA: Diagnosis not present

## 2021-04-15 NOTE — Patient Instructions (Signed)
[place] at [facility] Discharge Instructions  You were seen and examined today by Dr. Delton Coombes.  We are awaiting the results of genetic testing and other special test results to determine how we will treat your prostate cancer going forward.  Return to the clinic as scheduled.    Thank you for choosing [place] at [facility] to provide your oncology and hematology care.  To afford each patient quality time with our provider, please arrive at least 15 minutes before your scheduled appointment time.   If you have a lab appointment with the [facility] please come in thru the Main Entrance and check in at the main information desk.  You need to re-schedule your appointment should you arrive 10 or more minutes late.  We strive to give you quality time with our providers, and arriving late affects you and other patients whose appointments are after yours.  Also, if you no show three or more times for appointments you may be dismissed from the clinic at the providers discretion.     Again, thank you for choosing [facility].  Our hope is that these requests will decrease the amount of time that you wait before being seen by our physicians.       _____________________________________________________________  Should you have questions after your visit to [facility], please contact our office at [phone] and follow the prompts.  Our office hours are 8:00 a.m. and 4:30 p.m. Monday - Friday.  Please note that voicemails left after 4:00 p.m. may not be returned until the following business day.  We are closed weekends and major holidays.  You do have access to a nurse 24-7, just call the main number to the clinic [phone] and do not press any options, hold on the line and a nurse will answer the phone.    For prescription refill requests, have your pharmacy contact our office and allow 72 hours.    Due to Covid,  you will need to wear a mask upon entering the hospital. If you do not have a mask, a mask will be given to you at the Main Entrance upon arrival. For doctor visits, patients may have 1 support person age 15 or older with them. For treatment visits, patients can not have anyone with them due to social distancing guidelines and our immunocompromised population.

## 2021-04-15 NOTE — Progress Notes (Signed)
[place] testing requested per Dr. Delton Coombes on [phone]

## 2021-04-17 ENCOUNTER — Ambulatory Visit (HOSPITAL_COMMUNITY): Payer: Medicare Other

## 2021-04-22 ENCOUNTER — Ambulatory Visit (HOSPITAL_COMMUNITY): Payer: Medicare Other | Admitting: Hematology

## 2021-04-22 ENCOUNTER — Other Ambulatory Visit (HOSPITAL_COMMUNITY): Payer: Medicare Other

## 2021-04-24 ENCOUNTER — Other Ambulatory Visit: Payer: Self-pay

## 2021-04-24 ENCOUNTER — Ambulatory Visit (HOSPITAL_COMMUNITY)
Admission: RE | Admit: 2021-04-24 | Discharge: 2021-04-24 | Disposition: A | Discharge status: Home / Self Care | Payer: Medicare Other | Source: Ambulatory Visit | Attending: Hematology | Admitting: Hematology

## 2021-04-24 ENCOUNTER — Telehealth: Payer: Self-pay

## 2021-04-24 ENCOUNTER — Telehealth: Payer: Medicare Other

## 2021-04-24 DIAGNOSIS — I2510 Atherosclerotic heart disease of native coronary artery without angina pectoris: Secondary | ICD-10-CM | POA: Diagnosis not present

## 2021-04-24 DIAGNOSIS — C775 Secondary and unspecified malignant neoplasm of intrapelvic lymph nodes: Secondary | ICD-10-CM | POA: Diagnosis not present

## 2021-04-24 DIAGNOSIS — C61 Malignant neoplasm of prostate: Secondary | ICD-10-CM | POA: Diagnosis not present

## 2021-04-24 DIAGNOSIS — C7951 Secondary malignant neoplasm of bone: Secondary | ICD-10-CM | POA: Diagnosis not present

## 2021-04-24 DIAGNOSIS — I700 Atherosclerosis of aorta: Secondary | ICD-10-CM | POA: Diagnosis not present

## 2021-04-24 NOTE — Telephone Encounter (Signed)
   Telephone encounter was:  Unsuccessful.  04/24/2021 Name: Andrew [name] MRN: [id number] DOB: Dec 21, 1945  Unsuccessful outbound call made today to assist with:   home repairs.  Outreach Attempt:  1st Attempt  A HIPAA compliant voice message was left requesting a return call.  Instructed patient to call back at [phone].  Evani Shrider, AAS Paralegal, [facility]  [address], [place] [address] ??[email or web address]  ?? [phone]   [email or web address]

## 2021-04-25 ENCOUNTER — Telehealth: Payer: Self-pay

## 2021-04-25 NOTE — Telephone Encounter (Signed)
   Telephone encounter was:  Successful.  04/25/2021 Name: Andrew [name] MRN: [id number] DOB: 03/31/1946  Andrew [name] is a 75 y.o. year old male who is a primary care patient of Stacks, Cletus Gash, MD . The community resource team was consulted for assistance with  home repair.  Care guide performed the following interventions: Spoke with patient and his spouse about information for [facility] [phone].  Follow Up Plan:  No further follow up planned at this time. The patient has been provided with needed resources.  Tenna Lacko, AAS Paralegal, [facility]  [address], [place] [address] ??[email or web address]  ?? [phone]   [email or web address]

## 2021-04-27 NOTE — Progress Notes (Signed)
[place], [place] [address]   CLINIC:  Medical Oncology/Hematology  PCP:  Andrew Fraise, MD [address] [place] [address] [phone]   REASON FOR VISIT:  Follow-up for metastatic castration refractory prostate cancer  PRIOR THERAPY: neoadjuvant hormonal therapy, EBRT followed by brachytherapy with iodine 125 seeds in 2010  NGS Results: not done  CURRENT THERAPY: Lupron injection (started 08/10/2019) and enzalutamide 160 mg daily (started 04/17/2020)  BRIEF ONCOLOGIC HISTORY:  Oncology History   No history exists.    CANCER STAGING: Cancer Staging Prostate cancer metastatic to intrapelvic lymph node [facility]) Staging form: Prostate, [name] 7th Edition - Clinical stage from 04/02/2021: rT2b, N1, PSA: 10 to 19, Gleason 7 - Unsigned   INTERVAL HISTORY:  Andrew [name], a 75 y.o. male, returns for routine follow-up of his metastatic castration refractory prostate cancer. [name] was last seen on 04/15/2021.   Today he reports feeling well. He denies pain in his right hip and right scapula. He reports stable weight. He denies any current pains.   REVIEW OF SYSTEMS:  Review of Systems  Constitutional:  Negative for appetite change (60%), fatigue (50%) and unexpected weight change.  Musculoskeletal:  Positive for back pain (4/10). Negative for arthralgias.  Neurological:  Positive for dizziness, headaches and numbness.  All other systems reviewed and are negative.  PAST MEDICAL/SURGICAL HISTORY:  Past Medical History:  Diagnosis Date   Anemia    BPH (benign prostatic hyperplasia)    Sees Dr Andrew [name]   Cataract    Diabetes mellitus without complication ([place])    Hyperlipidemia    Hypertension    Low serum vitamin D    Prostate cancer ([place]) 2008   w/seed implantation and radiation   Past Surgical History:  Procedure Laterality Date   COLONOSCOPY  2015,2018   PROSTATE SURGERY  2008   seed implant    SOCIAL HISTORY:  Social  History   Socioeconomic History   Marital status: Married    Spouse name: Andrew [name]   Number of children: 2   Years of education: 12   Highest education level: 12th grade  Occupational History   Occupation: [name]    Comment: Retired Geologist, engineering  Tobacco Use   Smoking status: Every Day    Packs/day: 1.00    Years: 55.00    Pack years: 55.00    Types: Cigarettes    Start date: 06/30/1965   Smokeless tobacco: Never   Tobacco comments:    Has tried nicotine replacement gum  Vaping Use   Vaping Use: Never used  Substance and Sexual Activity   Alcohol use: No   Drug use: No   Sexual activity: Yes  Other Topics Concern   Not on file  Social History Narrative   Retired, lives at home with wife Andrew [name], two grown children, enjoys gardening     Social Determinants of Radio broadcast assistant Strain: Low Risk    Difficulty of Paying Living Expenses: Not hard at all  Food Insecurity: No Food Insecurity   Worried About Charity fundraiser in the Last Year: Never true   Arboriculturist in the Last Year: Never true  Transportation Needs: No Transportation Needs   Lack of Transportation (Medical): No   Lack of Transportation (Non-Medical): No  Physical Activity: Inactive   Days of Exercise per Week: 0 days   Minutes of Exercise per Session: 0 min  Stress: No Stress Concern Present  Feeling of Stress : Not at all  Social Connections: Socially Integrated   Frequency of Communication with Friends and Family: More than three times a week   Frequency of Social Gatherings with Friends and Family: More than three times a week   Attends Religious Services: More than 4 times per year   Active Member of [employer] or Organizations: Yes   Attends Music therapist: More than 4 times per year   Marital Status: Married  Human resources officer Violence: Not At Risk   Fear of Current or Ex-Partner: No   Emotionally Abused: No   Physically Abused: No   Sexually Abused: No     FAMILY HISTORY:  Family History  Problem Relation Age of Onset   Diabetes Mother    Heart disease Mother    [name] disease Mother        DIALYSIS   Emphysema Father    Deep vein thrombosis Sister    [name] disease Sister    Lung cancer Sister    Colon polyps Brother    Hypertension Brother    Gout Brother    Colon cancer Neg Hx    Esophageal cancer Neg Hx    Rectal cancer Neg Hx    Prostate cancer Neg Hx     CURRENT MEDICATIONS:  Current Outpatient Medications  Medication Sig Dispense Refill   acetaminophen (TYLENOL) 500 MG tablet Take 1,000 mg by mouth every 6 (six) hours as needed for moderate pain or headache. (Patient not taking: Reported on 04/15/2021)     alfuzosin (UROXATRAL) 10 MG 24 hr tablet Take 1 tablet (10 mg total) by mouth at bedtime. 30 tablet 11   aspirin 81 MG tablet Take 81 mg by mouth daily.     atorvastatin (LIPITOR) 40 MG tablet TAKE 1 TABLET BY MOUTH  DAILY 90 tablet 3   cholecalciferol (VITAMIN D) 1000 UNITS tablet Take 1,000 Units by mouth daily.      clotrimazole-betamethasone (LOTRISONE) cream Apply 1 application topically 2 (two) times daily. 30 g 0   enzalutamide (XTANDI) 40 MG tablet Take 160 mg by mouth daily.     glucose blood test strip 1 each by Other route as needed for other. Use as instructed to test blood sugar daily.  One Touch Verio Test Strips DX: E11.65 100 each 11   lisinopril-hydrochlorothiazide (ZESTORETIC) 20-25 MG tablet TAKE 1 TABLET BY MOUTH  DAILY 90 tablet 3   Melatonin 3 MG CAPS Take 3 mg by mouth at bedtime as needed (sleep).     metFORMIN (GLUCOPHAGE) 1000 MG tablet Take 1 tablet (1,000 mg total) by mouth 2 (two) times daily with a meal. 180 tablet 3   tamsulosin (FLOMAX) 0.4 MG CAPS capsule Take 1 capsule (0.4 mg total) by mouth daily. 90 capsule 3   tizanidine (ZANAFLEX) 6 MG capsule Take 1 capsule (6 mg total) by mouth 3 (three) times daily as needed for muscle spasms. (Patient not taking: Reported on 04/15/2021) 90  capsule 1   traZODone (DESYREL) 150 MG tablet Use from 1/3 to 1 tablet nightly as needed for sleep. 90 tablet 3   No current facility-administered medications for this visit.    ALLERGIES:  No Known Allergies  PHYSICAL EXAM:  Performance status (ECOG): 1 - Symptomatic but completely ambulatory  There were no vitals filed for this visit. Wt Readings from Last 3 Encounters:  04/15/21 203 lb 7.8 oz (92.3 kg)  04/08/21 202 lb (91.6 kg)  04/02/21 205 lb 14.6 oz (93.4 kg)  Physical Exam Vitals reviewed.  Constitutional:      Appearance: Normal appearance. He is obese.  Cardiovascular:     Rate and Rhythm: Normal rate and regular rhythm.     Pulses: Normal pulses.     Heart sounds: Normal heart sounds.  Pulmonary:     Effort: Pulmonary effort is normal.     Breath sounds: Normal breath sounds.  Neurological:     General: No focal deficit present.     Mental Status: He is alert and oriented to person, place, and time.  Psychiatric:        Mood and Affect: Mood normal.        Behavior: Behavior normal.     LABORATORY DATA:  I have reviewed the labs as listed.  CBC Latest Ref Rng & Units 04/08/2021 01/29/2021 10/24/2020  WBC 4.0 - 10.5 K/uL 6.3 6.6 6.2  Hemoglobin 13.0 - 17.0 g/dL 12.0(L) 11.9(L) 11.8(L)  Hematocrit 39.0 - 52.0 % 36.0(L) 35.8(L) 35.5(L)  Platelets 150 - 400 K/uL 221 199 226   CMP Latest Ref Rng & Units 02/27/2021 01/29/2021 10/24/2020  Glucose 65 - 99 mg/dL 102(H) 111(H) 107(H)  BUN 8 - 27 mg/dL '13 19 20  ' Creatinine 0.76 - 1.27 mg/dL 1.30(H) 1.50(H) 1.47(H)  Sodium 134 - 144 mmol/L 139 137 140  Potassium 3.5 - 5.2 mmol/L 3.8 4.1 3.8  Chloride 96 - 106 mmol/L 100 100 100  CO2 20 - 29 mmol/L '25 23 22  ' Calcium 8.6 - 10.2 mg/dL 9.2 9.1 9.2  Total Protein 6.0 - 8.5 g/dL - 6.4 6.8  Total Bilirubin 0.0 - 1.2 mg/dL - 0.5 0.3  Alkaline Phos 44 - 121 IU/L - 89 107  AST 0 - 40 IU/L - 19 23  ALT 0 - 44 IU/L - 11 15    DIAGNOSTIC IMAGING:  I have independently  reviewed the scans and discussed with the patient. CT Biopsy  Result Date: 04/08/2021 INDICATION: History of prostate cancer, now with enlarging left external iliac change lymph node worrisome for metastatic disease. Please perform CT-guided biopsy for tissue diagnostic purposes. EXAM: CT-GUIDED BIOPSY OF ENLARGING LEFT EXTERNAL ILIAC CHAIN LYMPH NODE COMPARISON:  CT abdomen pelvis-03/07/2021; 03/09/2020 MEDICATIONS: None. ANESTHESIA/SEDATION: Fentanyl 75 mcg IV; Versed 1.5 mg IV Sedation time: 18 minutes; The patient was continuously monitored during the procedure by the interventional radiology nurse under my direct supervision. CONTRAST:  None. COMPLICATIONS: None immediate. PROCEDURE: Informed consent was obtained from the patient following an explanation of the procedure, risks, benefits and alternatives. A time out was performed prior to the initiation of the procedure. The patient was positioned supine on the CT table and a limited CT was performed for procedural planning demonstrating unchanged size and appearance of the approximately 3.6 x 2.8 cm enlarging left external iliac chain lymph node (image 25, series 3. The procedure was planned. The operative site was prepped and draped in the usual sterile fashion. Appropriate trajectory was confirmed with a 22 gauge spinal needle after the adjacent tissues were anesthetized with 1% Lidocaine with epinephrine. Under intermittent CT guidance, a 17 gauge coaxial needle was advanced into the peripheral aspect of the mass. Appropriate positioning was confirmed and 6 core needle biopsy samples were obtained with an 18 gauge core needle biopsy device. The co-axial needle was removed following administration of a Gel-Foam slurry and superficial hemostasis was achieved with manual compression. A limited postprocedural CT was negative for hemorrhage or additional complication. A dressing was placed. The patient tolerated the procedure well  without immediate  postprocedural complication. IMPRESSION: Technically successful CT guided core needle biopsy of enlarging left external iliac chain lymph node. Electronically Signed   By: Sandi Mariscal M.D.   On: 04/08/2021 15:01   NM PET (PSMA) SKULL TO MID THIGH  Result Date: 04/26/2021 CLINICAL DATA:  Prostate carcinoma with biochemical recurrence. Metastatic surveillance EXAM: NUCLEAR MEDICINE PET SKULL BASE TO THIGH TECHNIQUE: 8.9 mCi F18 Piflufolastat (Pylarify) was injected intravenously. Full-ring PET imaging was performed from the skull base to thigh after the radiotracer. CT data was obtained and used for attenuation correction and anatomic localization. COMPARISON:  CT and bone scan 03/07/2021 FINDINGS: NECK No abnormal radiotracer activity in cervical nodes. Incidental CT finding: None CHEST No radiotracer accumulation within mediastinal or hilar lymph nodes. No suspicious pulmonary nodules on the CT scan. Incidental CT finding: Coronary artery calcification and aortic atherosclerotic calcification. ABDOMEN/PELVIS Prostate: Brachytherapy seeds within the prostate gland. No abnormal focal activity. Lymph nodes: Enlarged intensely radiotracer avid LEFT iliac lymph node measures 33 mm with SUV max equal 285. Wispy nodal material adjacent to the distal abdominal aorta just above the bifurcation of the LEFT (image 153/series 4) has intense radiotracer activity with SUV max equal 70.9. This node is barely measurable at 1 mm. A similar 4 mm node between the IVC and aorta on image 148 SUV max equal 118. Intense physiologic activity noted in the small bowel. Liver: No evidence of liver metastasis Incidental CT finding: Atherosclerotic calcification of the aorta. SKELETON Intense focal activity within the anterior margin of the RIGHT acetabulum with SUV max equal 95.6. No clear CT lesion. Small lesion in the LEFT iliac bone additionally. Intense radiotracer activity in LEFT transverse process of the T11 vertebral body with SUV  max equal 29. Focal lesion in the spine of the RIGHT scapula with SUV max equal 18.5. IMPRESSION: 1. Single bulky LEFT external iliac node with intense metabolic activity. Very small (less than 5 mm) periaortic retroperitoneal nodes with intense radiotracer activity. Findings consistent with prostate cancer nodal metastasis. 2. Several small foci of intense radiotracer activity within the skeleton consistent skeletal metastasis. Lesions involve the RIGHT scapula, T11 vertebral body, and RIGHT acetabulum. Lesions are occult by CT imaging. Additionally lesions not identified on comparison MDP bone scan. Electronically Signed   By: Suzy Bouchard M.D.   On: 04/26/2021 14:14     ASSESSMENT:  Metastatic castration refractory prostate cancer to left iliac lymph node: - History of prostate adenocarcinoma, T2b, Gleason 7, PSA 16.6 - Treated with neoadjuvant hormonal therapy, EBRT followed by brachytherapy with iodine 125 seeds in 2010 - Rising PSA levels in February 2019 - Bone scan on 08/10/2017 negative.  CTAP on 08/26/2017 shows enlarged left pelvic sidewall lymph node 2.2 cm. - Lupron initiated on 08/10/2019. - CTAP on 03/09/2020 with left internal/external iliac lymph node measuring 2.1 x 2.8 cm (2.1 x 2.2 cm), craniocaudal 4.1 cm, previously 3.4 cm. - Enzalutamide to 160 mg daily started on 04/17/2020. - PSA on 02/20/2021 (5.2), 07/11/2020 (2.0), 02/08/2020 (3.1) - CTAP on 03/07/2021 with left iliac bifurcation lymph node measuring 3.5 x 2.6 cm (previously 2.8 x 2.0 cm) - Bone scan on 03/07/2021 negative. - Left pelvic lymph node biopsy on 04/08/2021, metastatic prostate cancer.  2.  Social/family history: - He lives at home with his wife.  He is able to do household activities including working in the garden and Film/video editor.  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 - He is current  active smoker, 1 pack/day for 55 years. - Sister died of lung cancer.   PLAN:  Metastatic  castration refractory prostate cancer to the left iliac lymph node: - Report from NGS testing showed few tumor cells <100 on 1 paraffin block.  We will find out with pathology to see if they have additional paraffin blocks.  If not we will consider sending guardant 360. - We have sent germline mutation testing for PARPi therapy.  Results pending. - We will know the status of MSI, TMB and HRRm from NGS testing which will guide [place] towards future treatments. - We have reviewed PSMA PET scan results which showed enlarged left iliac lymph node 33 mm with SUV 285.  1 mm lymph node and 4 mm node between IVC and aorta.  Activity in the right acetabulum, left iliac bone, left transverse process of T11 vertebral body, spine of the right scapula. - He does not report any pain at this time.  RTC 5 weeks for follow-up.  We will plan to repeat PSA and routine labs at next visit. - If no targetable mutations, he will be considered for docetaxel.  2.  Smoking history: - He has 55-pack-year smoking history and his current active smoker. - CT part of the PET scan on 04/24/2021 did not show any significant findings.  3.  Loss of appetite: - Appetite is slightly low.  No weight loss in the last 2 weeks. - If there is any weight loss, consider appetite stimulant.  4.  Bone metastasis: - I have recommended denosumab to decrease skeletal related events. - He will have dental work done prior to initiation of denosumab.   Orders placed this encounter:  No orders of the defined types were placed in this encounter.    Derek Jack, MD [place] [phone]   I, Thana Ates, am acting as a scribe for Dr. Derek Jack.  I, Derek Jack MD, have reviewed the above documentation for accuracy and completeness, and I agree with the above.

## 2021-04-29 ENCOUNTER — Inpatient Hospital Stay (HOSPITAL_BASED_OUTPATIENT_CLINIC_OR_DEPARTMENT_OTHER): Payer: Medicare Other | Admitting: Hematology

## 2021-04-29 ENCOUNTER — Other Ambulatory Visit: Payer: Self-pay

## 2021-04-29 VITALS — BP 118/89 | HR 72 | Temp 98.3°F | Resp 18 | Wt 203.5 lb

## 2021-04-29 DIAGNOSIS — Z79899 Other long term (current) drug therapy: Secondary | ICD-10-CM | POA: Diagnosis not present

## 2021-04-29 DIAGNOSIS — Z7982 Long term (current) use of aspirin: Secondary | ICD-10-CM | POA: Diagnosis not present

## 2021-04-29 DIAGNOSIS — Z801 Family history of malignant neoplasm of trachea, bronchus and lung: Secondary | ICD-10-CM | POA: Diagnosis not present

## 2021-04-29 DIAGNOSIS — C61 Malignant neoplasm of prostate: Secondary | ICD-10-CM

## 2021-04-29 DIAGNOSIS — C7951 Secondary malignant neoplasm of bone: Secondary | ICD-10-CM | POA: Diagnosis not present

## 2021-04-29 DIAGNOSIS — Z7984 Long term (current) use of oral hypoglycemic drugs: Secondary | ICD-10-CM | POA: Diagnosis not present

## 2021-04-29 DIAGNOSIS — I10 Essential (primary) hypertension: Secondary | ICD-10-CM | POA: Diagnosis not present

## 2021-04-29 DIAGNOSIS — E119 Type 2 diabetes mellitus without complications: Secondary | ICD-10-CM | POA: Diagnosis not present

## 2021-04-29 DIAGNOSIS — R0602 Shortness of breath: Secondary | ICD-10-CM | POA: Diagnosis not present

## 2021-04-29 DIAGNOSIS — E118 Type 2 diabetes mellitus with unspecified complications: Secondary | ICD-10-CM

## 2021-04-29 DIAGNOSIS — R42 Dizziness and giddiness: Secondary | ICD-10-CM | POA: Diagnosis not present

## 2021-04-29 DIAGNOSIS — C775 Secondary and unspecified malignant neoplasm of intrapelvic lymph nodes: Secondary | ICD-10-CM | POA: Diagnosis not present

## 2021-04-29 DIAGNOSIS — F17210 Nicotine dependence, cigarettes, uncomplicated: Secondary | ICD-10-CM | POA: Diagnosis not present

## 2021-04-29 NOTE — Patient Instructions (Signed)
[place] at [facility] Discharge Instructions  You were seen and examined today by Dr. Delton Coombes. He reviewed your most recent scan and it is showing some spots in your lymph nodes of your left groin and hip area lighting up. Please keep follow up in 5 weeks.   Thank you for choosing [place] at [facility] to provide your oncology and hematology care.  To afford each patient quality time with our provider, please arrive at least 15 minutes before your scheduled appointment time.   If you have a lab appointment with the [facility] please come in thru the Main Entrance and check in at the main information desk.  You need to re-schedule your appointment should you arrive 10 or more minutes late.  We strive to give you quality time with our providers, and arriving late affects you and other patients whose appointments are after yours.  Also, if you no show three or more times for appointments you may be dismissed from the clinic at the providers discretion.     Again, thank you for choosing [facility].  Our hope is that these requests will decrease the amount of time that you wait before being seen by our physicians.       _____________________________________________________________  Should you have questions after your visit to [facility], please contact our office at [phone] and follow the prompts.  Our office hours are 8:00 a.m. and 4:30 p.m. Monday - Friday.  Please note that voicemails left after 4:00 p.m. may not be returned until the following business day.  We are closed weekends and major holidays.  You do have access to a nurse 24-7, just call the main number to the clinic [phone] and do not press any options, hold on the line and a nurse will answer the phone.    For prescription refill requests, have your pharmacy contact our office and allow 72 hours.    Due to Covid, you will need to wear a  mask upon entering the hospital. If you do not have a mask, a mask will be given to you at the Main Entrance upon arrival. For doctor visits, patients may have 1 support person age 51 or older with them. For treatment visits, patients can not have anyone with them due to social distancing guidelines and our immunocompromised population.

## 2021-04-30 ENCOUNTER — Ambulatory Visit (HOSPITAL_COMMUNITY): Payer: Medicare Other | Admitting: Hematology

## 2021-05-01 ENCOUNTER — Encounter: Payer: Self-pay | Admitting: Family Medicine

## 2021-05-01 ENCOUNTER — Other Ambulatory Visit: Payer: Self-pay

## 2021-05-01 ENCOUNTER — Ambulatory Visit (INDEPENDENT_AMBULATORY_CARE_PROVIDER_SITE_OTHER): Payer: Medicare Other | Admitting: Family Medicine

## 2021-05-01 VITALS — BP 124/70 | HR 67 | Temp 97.1°F | Ht 67.5 in | Wt 201.8 lb

## 2021-05-01 DIAGNOSIS — E785 Hyperlipidemia, unspecified: Secondary | ICD-10-CM

## 2021-05-01 DIAGNOSIS — I10 Essential (primary) hypertension: Secondary | ICD-10-CM | POA: Diagnosis not present

## 2021-05-01 DIAGNOSIS — E1169 Type 2 diabetes mellitus with other specified complication: Secondary | ICD-10-CM

## 2021-05-01 DIAGNOSIS — E118 Type 2 diabetes mellitus with unspecified complications: Secondary | ICD-10-CM | POA: Diagnosis not present

## 2021-05-01 LAB — BAYER DCA HB A1C WAIVED: HB A1C (BAYER DCA - WAIVED): 5.6 % (ref 4.8–5.6)

## 2021-05-01 NOTE — Progress Notes (Signed)
Subjective:  Patient ID: Andrew [name], male    DOB: [birth year]  Age: 75 y.o. MRN: [id number]  CC: Medical Management of Chronic Issues   HPI Andrew [name] presents forFollow-up of diabetes. Patient checks blood sugar at home.   110-130 fasting and not checking  postprandial Patient denies symptoms such as polyuria, polydipsia, excessive hunger, nausea No significant hypoglycemic spells noted. Medications reviewed. Pt reports taking them regularly without complication/adverse reaction being reported today.   Now following with Urology and Oncology for his prostate CA via PSA. He has mets at the left iliac lymph node. Dr. Tomie China report of 10/31 reviewed. Continues Uroxatrol.  History Esa has a past medical history of Anemia, BPH (benign prostatic hyperplasia), Cataract, Diabetes mellitus without complication (Andrew [name]), Hyperlipidemia, Hypertension, Low serum vitamin D, and Prostate cancer (Andrew [name]) (2008).   He has a past surgical history that includes Prostate surgery (2008) and Colonoscopy (3817,7116).   His family history includes Colon polyps in his brother; Deep vein thrombosis in his sister; Diabetes in his mother; Emphysema in his father; Gout in his brother; Heart disease in his mother; Hypertension in his brother; Kidney disease in his mother and sister; Lung cancer in his sister.He reports that he has been smoking cigarettes. He started smoking about 55 years ago. He has a 55.00 pack-year smoking history. He has never used smokeless tobacco. He reports that he does not drink alcohol and does not use drugs.  Current Outpatient Medications on File Prior to Visit  Medication Sig Dispense Refill   alfuzosin (UROXATRAL) 10 MG 24 hr tablet Take 1 tablet (10 mg total) by mouth at bedtime. 30 tablet 11   aspirin 81 MG tablet Take 81 mg by mouth daily.     atorvastatin (LIPITOR) 40 MG tablet TAKE 1 TABLET BY MOUTH  DAILY 90 tablet 3   cholecalciferol (VITAMIN D) 1000 UNITS tablet Take 1,000  Units by mouth daily.      clotrimazole-betamethasone (LOTRISONE) cream Apply 1 application topically 2 (two) times daily. 30 g 0   glucose blood test strip 1 each by Other route as needed for other. Use as instructed to test blood sugar daily.  One Touch Verio Test Strips DX: E11.65 100 each 11   lisinopril-hydrochlorothiazide (ZESTORETIC) 20-25 MG tablet TAKE 1 TABLET BY MOUTH  DAILY 90 tablet 3   metFORMIN (GLUCOPHAGE) 1000 MG tablet Take 1 tablet (1,000 mg total) by mouth 2 (two) times daily with a meal. 180 tablet 3   No current facility-administered medications on file prior to visit.    ROS Review of Systems  Constitutional:  Negative for fever.  Respiratory:  Negative for shortness of breath.   Cardiovascular:  Negative for chest pain.  Genitourinary:  Negative for difficulty urinating.  Musculoskeletal:  Negative for arthralgias.  Skin:  Negative for rash.   Objective:  BP 124/70   Pulse 67   Temp (!) 97.1 F (36.2 C)   Ht 5' 7.5" (1.715 m)   Wt 201 lb 12.8 oz (91.5 kg)   SpO2 98%   BMI 31.14 kg/m   BP Readings from Last 3 Encounters:  05/01/21 124/70  04/29/21 118/89  04/15/21 138/73    Wt Readings from Last 3 Encounters:  05/01/21 201 lb 12.8 oz (91.5 kg)  04/29/21 203 lb 8 oz (92.3 kg)  04/15/21 203 lb 7.8 oz (92.3 kg)     Physical Exam Vitals reviewed.  Constitutional:      Appearance: He is well-developed.  HENT:  Head: Normocephalic and atraumatic.     Right Ear: External ear normal.     Left Ear: External ear normal.     Mouth/Throat:     Pharynx: No oropharyngeal exudate or posterior oropharyngeal erythema.  Eyes:     Pupils: Pupils are equal, round, and reactive to light.  Cardiovascular:     Rate and Rhythm: Normal rate and regular rhythm.     Heart sounds: No murmur heard. Pulmonary:     Effort: No respiratory distress.     Breath sounds: Normal breath sounds.  Musculoskeletal:     Cervical back: Normal range of motion and neck  supple.  Neurological:     Mental Status: He is alert and oriented to person, place, and time.      Assessment & Plan:   Andrew [name] was seen today for medical management of chronic issues.  Diagnoses and all orders for this visit:  Type 2 diabetes mellitus with complication, without long-term current use of insulin ([place]) -     [facility]ayer DCA Hb A1c Waived -     CBC with Differential/Platelet  Essential hypertension -     CMP14+EGFR  Hyperlipidemia associated with type 2 diabetes mellitus ([place]) -     Lipid panel     I have discontinued Andrew [name]. Andrew [name]'s traZODone, tamsulosin, tizanidine, enzalutamide, acetaminophen, and Melatonin. I am also having him maintain his aspirin, cholecalciferol, glucose blood, clotrimazole-betamethasone, metFORMIN, atorvastatin, lisinopril-hydrochlorothiazide, and alfuzosin.  No orders of the defined types were placed in this encounter.    Follow-up: Return in about 6 months (around 10/29/2021).  Andrew [name], M.D.

## 2021-05-02 LAB — LIPID PANEL
Chol/HDL Ratio: 3.6 ratio (ref 0.0–5.0)
Cholesterol, Total: 115 mg/dL (ref 100–199)
HDL: 32 mg/dL — ABNORMAL LOW (ref >39)
LDL Chol Calc (NIH): 59 mg/dL (ref 0–99)
Triglycerides: 132 mg/dL (ref 0–149)
VLDL Cholesterol Cal: 24 mg/dL (ref 5–40)

## 2021-05-02 LAB — CBC WITH DIFFERENTIAL/PLATELET
Basophils Absolute: 0.1 10*3/uL (ref 0.0–0.2)
Basos: 1 %
EOS (ABSOLUTE): 0.3 10*3/uL (ref 0.0–0.4)
Eos: 5 %
Hematocrit: 33.3 % — ABNORMAL LOW (ref 37.5–51.0)
Hemoglobin: 11.3 g/dL — ABNORMAL LOW (ref 13.0–17.7)
Immature Grans (Abs): 0 10*3/uL (ref 0.0–0.1)
Immature Granulocytes: 0 %
Lymphocytes Absolute: 2.1 10*3/uL (ref 0.7–3.1)
Lymphs: 35 %
MCH: 31 pg (ref 26.6–33.0)
MCHC: 33.9 g/dL (ref 31.5–35.7)
MCV: 92 fL (ref 79–97)
Monocytes Absolute: 0.5 10*3/uL (ref 0.1–0.9)
Monocytes: 8 %
Neutrophils Absolute: 3.1 10*3/uL (ref 1.4–7.0)
Neutrophils: 51 %
Platelets: 230 10*3/uL (ref 150–450)
RBC: 3.64 x10E6/uL — ABNORMAL LOW (ref 4.14–5.80)
RDW: 13.2 % (ref 11.6–15.4)
WBC: 6.1 10*3/uL (ref 3.4–10.8)

## 2021-05-02 LAB — CMP14+EGFR
ALT: 11 IU/L (ref 0–44)
AST: 24 IU/L (ref 0–40)
Albumin/Globulin Ratio: 1.6 (ref 1.2–2.2)
Albumin: 3.9 g/dL (ref 3.7–4.7)
Alkaline Phosphatase: 99 IU/L (ref 44–121)
BUN/Creatinine Ratio: 12 (ref 10–24)
BUN: 16 mg/dL (ref 8–27)
Bilirubin Total: 0.4 mg/dL (ref 0.0–1.2)
CO2: 24 mmol/L (ref 20–29)
Calcium: 9.2 mg/dL (ref 8.6–10.2)
Chloride: 102 mmol/L (ref 96–106)
Creatinine, Ser: 1.29 mg/dL — ABNORMAL HIGH (ref 0.76–1.27)
Globulin, Total: 2.5 g/dL (ref 1.5–4.5)
Glucose: 102 mg/dL — ABNORMAL HIGH (ref 70–99)
Potassium: 3.9 mmol/L (ref 3.5–5.2)
Sodium: 139 mmol/L (ref 134–144)
Total Protein: 6.4 g/dL (ref 6.0–8.5)
eGFR: 58 mL/min/{1.73_m2} — ABNORMAL LOW (ref >59)

## 2021-05-06 ENCOUNTER — Ambulatory Visit (INDEPENDENT_AMBULATORY_CARE_PROVIDER_SITE_OTHER): Payer: Medicare Other | Admitting: *Deleted

## 2021-05-06 DIAGNOSIS — E118 Type 2 diabetes mellitus with unspecified complications: Secondary | ICD-10-CM

## 2021-05-06 DIAGNOSIS — I10 Essential (primary) hypertension: Secondary | ICD-10-CM

## 2021-05-06 DIAGNOSIS — Z77128 Contact with and (suspected) exposure to other hazards in the physical environment: Secondary | ICD-10-CM

## 2021-05-06 DIAGNOSIS — C61 Malignant neoplasm of prostate: Secondary | ICD-10-CM

## 2021-05-06 NOTE — Chronic Care Management (AMB) (Signed)
Chronic Care Management   CCM RN Visit Note  05/06/2021 Name: Andrew [name] MRN: [id number] DOB: 1945-12-27  Subjective: Andrew [name] is a 75 y.o. year old male who is a primary care patient of Stacks, Cletus Gash, MD. The care management team was consulted for assistance with disease management and care coordination needs.    Engaged with patient by telephone for follow up visit in response to provider referral for case management and/or care coordination services.   Consent to Services:  The patient was given information about Chronic Care Management services, agreed to services, and gave verbal consent prior to initiation of services.  Please see initial visit note for detailed documentation.   Patient agreed to services and verbal consent obtained.   Assessment: Review of patient past medical history, allergies, medications, health status, including review of consultants reports, laboratory and other test data, was performed as part of comprehensive evaluation and provision of chronic care management services.   SDOH (Social Determinants of Health) assessments and interventions performed:    CCM Care Plan  No Known Allergies  Outpatient Encounter Medications as of 05/06/2021  Medication Sig Note   alfuzosin (UROXATRAL) 10 MG 24 hr tablet Take 1 tablet (10 mg total) by mouth at bedtime.    aspirin 81 MG tablet Take 81 mg by mouth daily.    atorvastatin (LIPITOR) 40 MG tablet TAKE 1 TABLET BY MOUTH  DAILY    cholecalciferol (VITAMIN D) 1000 UNITS tablet Take 1,000 Units by mouth daily.     clotrimazole-betamethasone (LOTRISONE) cream Apply 1 application topically 2 (two) times daily.    glucose blood test strip 1 each by Other route as needed for other. Use as instructed to test blood sugar daily.  One Touch Verio Test Strips DX: E11.65 10/17/2019: One Touch Verio   lisinopril-hydrochlorothiazide (ZESTORETIC) 20-25 MG tablet TAKE 1 TABLET BY MOUTH  DAILY    metFORMIN (GLUCOPHAGE) 1000 MG  tablet Take 1 tablet (1,000 mg total) by mouth 2 (two) times daily with a meal.    No facility-administered encounter medications on file as of 05/06/2021.    Patient Active Problem List   Diagnosis Date Noted   Aortic atherosclerosis ([place]) 01/18/2021   Primary insomnia 07/26/2020   Sacroiliitis ([place]) 07/26/2020   Nocturia 02/08/2020   Nonspecific abnormal electrocardiogram (ECG) (EKG) 07/18/2019   Type 2 diabetes mellitus with complication, without long-term current use of insulin ([place]) 07/18/2019   Educated about COVID-19 virus infection 07/18/2019   SOB (shortness of breath) 12/10/2016   Erectile dysfunction due to arterial insufficiency 10/30/2016   Polyp of colon 10/30/2016   Peripheral vascular insufficiency ([place]) 06/13/2016   Vitamin D deficiency 08/24/2015   Prostate cancer metastatic to intrapelvic lymph node ([place]) 04/07/2014   Diabetes mellitus type 2, uncontrolled 09/14/2010   Benign prostatic hyperplasia with urinary obstruction 09/14/2010   Essential hypertension 09/14/2010   Hyperlipidemia associated with type 2 diabetes mellitus ([place]) 09/14/2010   Tobacco abuse 09/14/2010    Conditions to be addressed/monitored:HTN, DMII, and metastatic prostate cancer  Care Plan : [facility] Care Plan  Updates made by Ilean China, RN since 05/06/2021 12:00 AM     Problem: Chronic Diseaes Managment Needs   Priority: Medium     Goal: Work with [facility] Regarding Care Management and Care Coordination Associated with HTN, DM, metastatic prostate cancer, poor oral health   Start Date: 02/22/2021  This Visit's Progress: On track  Recent Progress: On track  Priority: Medium  Note:   Current Barriers:  Chronic Disease Management support and education needs related to poor oral health in a patient with hypertension, diabetes, and metastatic prostate cancer Chronic disease management and care coordination needs related to HTN, DM, and metastatic prostate cancer Unable to independently  drive Hazards in home: roof is leaking  RNCM Clinical Goal(s):  Patient will continue to work with RN Care Manager to address care management and care coordination needs related to HTN, DMII, and , metastatic prostate cancer, poor oral health  through collaboration with RN Care manager/LCSW, provider, and care team.  Patient/wife will reach out to [facility] [phone] regarding home repairs. Patient will keep all oncology follow-up appointments Patient will schedule and keep appointment with dentist for evaluation and treatment  Interventions: 1:1 collaboration with primary care provider regarding development and update of comprehensive plan of care as evidenced by provider attestation and co-signature Inter-disciplinary care team collaboration (see longitudinal plan of care) Evaluation of current treatment plan related to  self management and patient's adherence to plan as established by provider   Diabetes:  (Status: Condition stable. Not addressed this visit.) Lab Results  Component Value Date   HGBA1C 6.1 01/29/2021   HGBA1C 6.0 10/24/2020   HGBA1C 6.4 07/26/2020   Lab Results  Component Value Date   MICROALBUR 100 11/21/2014   [place] 68 01/29/2021   CREATININE 1.30 (H) 02/27/2021  Assessed patient's understanding of A1c goal: <7% Reviewed medications with patient and discussed importance of medication adherence        Reviewed prescribed diet with patient ADA Carb modified diet Counseled on importance of regular laboratory monitoring as prescribed        Encouraged patient to continue checking and recording blood sugar daily and as needed       call provider for findings outside established parameters       Assessed social determinant of health barriers   Hypertension: (Status: Condition stable. Not addressed this visit.) Last practice recorded BP readings:  BP Readings from Last 3 Encounters:  04/02/21 137/77  03/27/21  125/69  02/27/21 112/67  Reviewed medications with patient and discussed importance of medication adherence Encouraged patient to check and record blood pressure at least 3 times per week and to call PCP with any readings outside of recommended range Reinforced low sodium/DASH diet Discussed plans with patient for ongoing care management follow up and provided patient with direct contact information for care management team   Oral Health:  (Status: Goal on track: NO.) Reviewed and discussed medications Previously placed referral to [employer] who contacted the patient and provided him with information on the [facility] Confirmed that patient has this information Patient did have an appt but decided to cancel it Encouraged to schedule dental appointment and to reach out to Care Management team with any care coordination needs Has missing, loose and painful teeth. Has pulled a couple himself since we last talked Goal is to have them removed and get dentures Verbal education provided on the effect of poor oral health on overall health Discussed good oral hygiene practices Provided with RN Care Manager contact number and encouraged to reach out as needed   Oncology:  (Status: Goal on track: YES.) Reviewed and discussed recent oncology visits  Verified available transportation to appointments and treatments. Has consistent/reliable transportation: Yes Assessed support system. Has consistent/reliable family or other support: Yes Nutrition assessment performed. Appetite is decreased but is supplementing with Boost at times.  Reviewed upcoming appt with Dr  Katragadda with the [place] Discussed oncologists recommendation to start denosumab and that his dental work needs to be done prior to starting this. He had preciously decided to wait until the first of the year to have dental work due to better insurance coverage at that time. Will need to consider having it  done sooner.  Encouraged to reach out to [facility] as needed    SDOH Barriers (Status: Condition stable. Not addressed this visit.)  Patient interviewed and appropriate assessments performed Referred patient to community resources care guide team for assistance with hazards at home: leaking roof Advised patient that he should receive a telephone call from the [place] team   Patient Goals/Self-Care Activities: Patient will self administer medications as prescribed Patient will attend church or other social activities Patient will continue to perform ADL's independently Patient will continue to perform IADL's independently Patient will call provider office for new concerns or questions Patient will call RN Care Manager as needed [phone] Patient will keep all medical appointments Patient will schedule dental care Patient will talk with [facility] [phone] regarding home repairs   Plan:Telephone follow up appointment with care management team member scheduled for:  05/28/21 with RNCM The patient has been provided with contact information for the care management team and has been advised to call with any health related questions or concerns.   Chong Sicilian, BSN, RN-BC Embedded Chronic Care Manager [facility] [place] Management Direct Dial: [phone]

## 2021-05-06 NOTE — Patient Instructions (Signed)
Visit Information  Patient Goals/Self-Care Activities: Patient will self administer medications as prescribed Patient will attend church or other social activities Patient will continue to perform ADL's independently Patient will continue to perform IADL's independently Patient will call provider office for new concerns or questions Patient will call [facility] as needed [phone] Patient will keep all medical appointments Patient will schedule dental care Patient will talk with [facility] [phone] regarding home repairs   The patient verbalized understanding of instructions, educational materials, and care plan provided today and declined offer to receive copy of patient instructions, educational materials, and care plan.    Plan:Telephone follow up appointment with care management team member scheduled for:  05/28/21 with RNCM The patient has been provided with contact information for the care management team and has been advised to call with any health related questions or concerns.    Chong Sicilian, BSN, RN-BC Embedded Chronic Care Manager [facility] [place] Management Direct Dial: [phone]

## 2021-05-06 NOTE — Progress Notes (Signed)
Hello [place],  Your lab result is normal and/or stable.Some minor variations that are not significant are commonly marked abnormal, but do not represent any medical problem for you.  Best regards, [name], M.D.

## 2021-05-09 ENCOUNTER — Encounter: Payer: Self-pay | Admitting: Genetic Counselor

## 2021-05-09 ENCOUNTER — Inpatient Hospital Stay (HOSPITAL_COMMUNITY): Payer: Medicare Other | Attending: Hematology | Admitting: Genetic Counselor

## 2021-05-09 DIAGNOSIS — Z1379 Encounter for other screening for genetic and chromosomal anomalies: Secondary | ICD-10-CM

## 2021-05-09 DIAGNOSIS — C775 Secondary and unspecified malignant neoplasm of intrapelvic lymph nodes: Secondary | ICD-10-CM | POA: Diagnosis not present

## 2021-05-09 DIAGNOSIS — C61 Malignant neoplasm of prostate: Secondary | ICD-10-CM

## 2021-05-09 HISTORY — DX: Encounter for other screening for genetic and chromosomal anomalies: Z13.79

## 2021-05-09 NOTE — Progress Notes (Signed)
REFERRING PROVIDER: Derek Jack, MD  PRIMARY PROVIDER:  [name], MD  PRIMARY REASON FOR VISIT:  Encounter Diagnosis  Name Primary?   Prostate cancer metastatic to intrapelvic lymph node ([place]) Yes    I connected with Mr. Ake on 05/09/2021 at 11:00AM EDT by Webex video conference and verified that I am speaking with the correct person using two identifiers.   Patient location: [facility] Provider location: [facility]   HISTORY OF PRESENT ILLNESS:   Mr. Racicot, a 74 y.o. male, was seen for a [facility] cancer genetics consultation at the request of Dr. Delton Coombes due to a personal history of metastatic castration refractory prostate cancer.  Mr. Ballantine presents to clinic today to discuss the possibility of a hereditary predisposition to cancer, to discuss genetic testing, and to further clarify his future cancer risks, as well as potential cancer risks for family members.   CANCER HISTORY:  Personal history of metastatic castration refractory prostate cancer  Past Medical History:  Diagnosis Date   Anemia    BPH (benign prostatic hyperplasia)    Sees Dr [name]   Cataract    Diabetes mellitus without complication ([place])    Hyperlipidemia    Hypertension    Low serum vitamin D    Prostate cancer ([place]) 2008   w/seed implantation and radiation    Past Surgical History:  Procedure Laterality Date   COLONOSCOPY  2015,2018   PROSTATE SURGERY  2008   seed implant    Social History   Socioeconomic History   Marital status: Married    Spouse name: [place]   Number of children: 2   Years of education: 12   Highest education level: 12th grade  Occupational History   Occupation: [name]    Comment: Retired Geologist, engineering  Tobacco Use   Smoking status: Every Day    Packs/day: 1.00    Years: 55.00    Pack years: 55.00    Types: Cigarettes    Start date: 06/30/1965   Smokeless tobacco: Never   Tobacco comments:     Has tried nicotine replacement gum  Vaping Use   Vaping Use: Never used  Substance and Sexual Activity   Alcohol use: No   Drug use: No   Sexual activity: Yes  Other Topics Concern   Not on file  Social History Narrative   Retired, lives at home with wife [name], two grown children, enjoys gardening     Social Determinants of Radio broadcast assistant Strain: Low Risk    Difficulty of Paying Living Expenses: Not hard at all  Food Insecurity: No Food Insecurity   Worried About Charity fundraiser in the Last Year: Never true   Arboriculturist in the Last Year: Never true  Transportation Needs: No Transportation Needs   Lack of Transportation (Medical): No   Lack of Transportation (Non-Medical): No  Physical Activity: Inactive   Days of Exercise per Week: 0 days   Minutes of Exercise per Session: 0 min  Stress: No Stress Concern Present   Feeling of Stress : Not at all  Social Connections: Socially Integrated   Frequency of Communication with Friends and Family: More than three times a week   Frequency of Social Gatherings with Friends and Family: More than three times a week   Attends Religious Services: More than 4 times per year   Active Member of [employer] or Organizations: Yes   Attends Archivist Meetings:  More than 4 times per year   Marital Status: Married     FAMILY HISTORY:  We obtained a detailed, 4-generation family history.  Significant diagnoses are listed below:    Mr. Sada sister was diagnosed with lung cancer and she is deceased. He is unaware of previous family history of genetic testing for hereditary cancer risks.  GENETIC COUNSELING ASSESSMENT: Mr. Hitchens is a 75 y.o. male with a personal history of metastatic prostate cancer. Dr. Delton Coombes ordered genetic testing and we discussed the results with Mr. Leinweber at today's visit.  GENETIC TEST RESULTS:  The [facility] Multi-Cancer Panel found no pathogenic mutations.   The Multi-Cancer + RNA Panel  offered by [facility] includes sequencing and/or deletion/duplication analysis of the following 84 genes:  AIP*, ALK, APC*, ATM*, AXIN2*, BAP1*, BARD1*, BLM*, BMPR1A*, BRCA1*, BRCA2*, BRIP1*, CASR, CDC73*, CDH1*, CDK4, CDKN1B*, CDKN1C*, CDKN2A, CEBPA, CHEK2*, CTNNA1*, DICER1*, DIS3L2*, EGFR, EPCAM, FH*, FLCN*, GATA2*, GPC3, GREM1, HOXB13, HRAS, KIT, MAX*, MEN1*, MET, MITF, MLH1*, MSH2*, MSH3*, MSH6*, MUTYH*, NBN*, NF1*, NF2*, NTHL1*, PALB2*, PDGFRA, PHOX2B, PMS2*, POLD1*, POLE*, POT1*, PRKAR1A*, PTCH1*, PTEN*, RAD50*, RAD51C*, RAD51D*, RB1*, RECQL4, RET, RUNX1*, SDHA*, SDHAF2*, SDHB*, SDHC*, SDHD*, SMAD4*, SMARCA4*, SMARCB1*, SMARCE1*, STK11*, SUFU*, TERC, TERT, TMEM127*, Tp53*, TSC1*, TSC2*, VHL*, WRN*, and WT1.  RNA analysis is performed for * genes.  The test report has been scanned into EPIC and is located under the Molecular Pathology section of the Results Review tab.  A portion of the result report is included below for reference. Genetic testing reported out on 05/07/2021.     Even though a pathogenic variant was not identified, possible explanations for Mr. Bialy cancer may include: There may be no hereditary risk for cancer in the family. The cancers in Mr. Woods and/or his family may be due to other genetic or environmental factors. There may be a gene mutation in one of these genes that current testing methods cannot detect, but that chance is small. There could be another gene that has not yet been discovered, or that we have not yet tested, that is responsible for the cancer diagnoses in the family.   Therefore, it is important to remain in touch with cancer genetics in the future so that we can continue to offer Mr. Dahir the most up to date genetic testing.     ADDITIONAL GENETIC TESTING:  We discussed with Mr. Neece that his genetic testing was fairly extensive.  If there are genes identified to increase cancer risk that can be analyzed in the future, we would be happy to discuss and  coordinate this testing at that time.    CANCER SCREENING RECOMMENDATIONS:  Mr. Taubman test result is considered negative (normal).  This means that we have not identified a hereditary cause for his personal history of cancer at this time.   An individual's cancer risk and medical management are not determined by genetic test results alone. Overall cancer risk assessment incorporates additional factors, including personal medical history, family history, and any available genetic information that may result in a personalized plan for cancer prevention and surveillance. Therefore, it is recommended he continue to follow the cancer management and screening guidelines provided by his oncology and primary healthcare provider.  RECOMMENDATIONS FOR FAMILY MEMBERS:   Since he did not inherit a mutation in a cancer predisposition gene included on this panel, his children could not have inherited a mutation from him in one of these genes.  FOLLOW-UP:  Cancer genetics is a rapidly advancing field and it is possible  that new genetic tests will be appropriate for him and/or his family members in the future. We encouraged him to remain in contact with cancer genetics on an annual basis so we can update his personal and family histories and let him know of advances in cancer genetics that may benefit this family.   Our contact number was provided. Mr. Bhola questions were answered to his satisfaction, and he knows he is welcome to call us at anytime with additional questions or concerns.   Lucille Passy, MS, [facility] Genetic Counselor [email or web address] (P) [phone]  The patient was seen for a total of 15 minutes in video genetic counseling. The patient brought his wife. Drs. Magrinat, Lindi Adie and/or Burr Medico were available to discuss this case as needed.   _______________________________________________________________________ For Office Staff:  Number of people involved in session: 2 Was an  Intern/ student involved with case: no

## 2021-05-28 ENCOUNTER — Ambulatory Visit: Payer: Medicare Other | Admitting: *Deleted

## 2021-05-28 DIAGNOSIS — E118 Type 2 diabetes mellitus with unspecified complications: Secondary | ICD-10-CM

## 2021-05-28 DIAGNOSIS — C61 Malignant neoplasm of prostate: Secondary | ICD-10-CM

## 2021-05-28 DIAGNOSIS — I10 Essential (primary) hypertension: Secondary | ICD-10-CM

## 2021-05-28 NOTE — Chronic Care Management (AMB) (Signed)
Chronic Care Management   CCM RN Visit Note  05/28/2021 Name: Andrew [name] MRN: [id number] DOB: 10/03/1945  Subjective: Andrew [name] is a 75 y.o. year old male who is a primary care patient of Stacks, Cletus Gash, MD. The care management team was consulted for assistance with disease management and care coordination needs.    Engaged with patient by telephone for follow up visit in response to provider referral for case management and/or care coordination services.   Consent to Services:  The patient was given information about Chronic Care Management services, agreed to services, and gave verbal consent prior to initiation of services.  Please see initial visit note for detailed documentation.   Patient agreed to services and verbal consent obtained.   Assessment: Review of patient past medical history, allergies, medications, health status, including review of consultants reports, laboratory and other test data, was performed as part of comprehensive evaluation and provision of chronic care management services.   SDOH (Social Determinants of Health) assessments and interventions performed:    CCM Care Plan  No Known Allergies  Outpatient Encounter Medications as of 05/28/2021  Medication Sig Note   alfuzosin (UROXATRAL) 10 MG 24 hr tablet Take 1 tablet (10 mg total) by mouth at bedtime.    aspirin 81 MG tablet Take 81 mg by mouth daily.    atorvastatin (LIPITOR) 40 MG tablet TAKE 1 TABLET BY MOUTH  DAILY    cholecalciferol (VITAMIN D) 1000 UNITS tablet Take 1,000 Units by mouth daily.     clotrimazole-betamethasone (LOTRISONE) cream Apply 1 application topically 2 (two) times daily.    glucose blood test strip 1 each by Other route as needed for other. Use as instructed to test blood sugar daily.  One Touch Verio Test Strips DX: E11.65 10/17/2019: One Touch Verio   lisinopril-hydrochlorothiazide (ZESTORETIC) 20-25 MG tablet TAKE 1 TABLET BY MOUTH  DAILY    metFORMIN (GLUCOPHAGE) 1000 MG  tablet Take 1 tablet (1,000 mg total) by mouth 2 (two) times daily with a meal.    No facility-administered encounter medications on file as of 05/28/2021.    Patient Active Problem List   Diagnosis Date Noted   Genetic testing 05/09/2021   Aortic atherosclerosis ([place]) 01/18/2021   Primary insomnia 07/26/2020   Sacroiliitis ([place]) 07/26/2020   Nocturia 02/08/2020   Nonspecific abnormal electrocardiogram (ECG) (EKG) 07/18/2019   Type 2 diabetes mellitus with complication, without long-term current use of insulin ([place]) 07/18/2019   Educated about COVID-19 virus infection 07/18/2019   SOB (shortness of breath) 12/10/2016   Erectile dysfunction due to arterial insufficiency 10/30/2016   Polyp of colon 10/30/2016   Peripheral vascular insufficiency ([place]) 06/13/2016   Vitamin D deficiency 08/24/2015   Prostate cancer metastatic to intrapelvic lymph node ([place]) 04/07/2014   Diabetes mellitus type 2, uncontrolled 09/14/2010   Benign prostatic hyperplasia with urinary obstruction 09/14/2010   Essential hypertension 09/14/2010   Hyperlipidemia associated with type 2 diabetes mellitus ([place]) 09/14/2010   Tobacco abuse 09/14/2010    Conditions to be addressed/monitored:HTN, DMII, and prostate cancer and poor oral health  Care Plan : [facility] Care Plan  Updates made by Ilean China, RN since 05/28/2021 12:00 AM     Problem: Chronic Diseaes Managment Needs   Priority: High     Long-Range Goal: Work with [facility] Regarding Care Management and Care Coordination Associated with HTN, DM, metastatic prostate cancer, poor oral health   Start Date: 02/22/2021  Expected End Date: 02/22/2022  This Visit's Progress: On track  Recent Progress: On track  Priority: High  Note:   Current Barriers:  Chronic Disease Management support and education needs related to poor oral health in a patient with hypertension, diabetes, and metastatic prostate cancer Chronic disease management and care coordination needs  related to HTN, DM, and metastatic prostate cancer Unable to independently drive Hazards in home: roof is leaking Has not followed up with [facility] regarding home repairs Has not followed up with [place]):  Patient will continue to work with RN Care Manager to address care management and care coordination needs related to HTN, DMII, and , metastatic prostate cancer, poor oral health  through collaboration with RN Care manager/LCSW, provider, and care team.  Patient/wife will reach out to [facility] [phone] regarding home repairs. Patient will keep all oncology follow-up appointments Patient will schedule and keep appointment with dentist for evaluation and treatment  Interventions: 1:1 collaboration with primary care provider regarding development and update of comprehensive plan of care as evidenced by provider attestation and co-signature Inter-disciplinary care team collaboration (see longitudinal plan of care) Evaluation of current treatment plan related to  self management and patient's adherence to plan as established by provider   Diabetes:  (Status: Goal on Track (progressing): YES.) Lab Results  Component Value Date   HGBA1C 5.6 05/01/2021   HGBA1C 6.1 01/29/2021   HGBA1C 6.0 10/24/2020   Lab Results  Component Value Date   MICROALBUR 100 11/21/2014   [place] 59 05/01/2021   CREATININE 1.29 (H) 05/01/2021  Assessed patient's understanding of A1c goal: <7% Reviewed medications with patient and discussed importance of medication adherence        Reviewed prescribed diet with patient ADA Carb modified diet Counseled on importance of regular laboratory monitoring as prescribed        Encouraged patient to continue checking and recording blood sugar daily and as needed       call provider for findings outside established  parameters       Discussed home blood sugar monitoring and average blood sugar readings Discussed the impact that stress and other medical conditions can have on blood sugar Assessed social determinant of health barriers   Hypertension: (Status: Goal on Track (progressing): YES.) Last practice recorded BP readings:  BP Readings from Last 3 Encounters:  05/01/21 124/70  04/29/21 118/89  04/15/21 138/73  Reviewed medications with patient and discussed importance of medication adherence Encouraged patient to check and record blood pressure at least 3 times per week and to call PCP with any readings outside of recommended range Reinforced low sodium/DASH diet Discussed plans with patient for ongoing care management follow up and provided patient with direct contact information for care management team   Oral Health:  (Status: Goal on track: NO.) Reviewed and discussed medications Previously placed referral to [facility] who contacted the patient and provided him with information on the [facility].  Provided with contact number again and encouraged to reach out for an appointment [phone] Provided with RN Care Manager contact number and encouraged to reach out as needed   Oncology:  (Status: Goal on track: YES.) Reviewed and discussed recent oncology visits  Verified available transportation to appointments and treatments. Has consistent/reliable transportation: Yes Assessed support system. Has consistent/reliable family or other support: Yes Nutrition assessment performed. Appetite is decreased but is supplementing with Boost at times.  Reviewed upcoming appt with Dr  Katragadda with the [facility] Discussed oncologists recommendation to start denosumab and that his dental work needs to be done prior to starting this. He had previously decided to wait until the first of the year to have dental work due to better insurance coverage at that time. Will need  to consider having it done sooner.  Provided with [facility] telephone number again Encouraged to reach out to [facility] as needed    SDOH Barriers (Status: Goal Met. Provided with Needed Resource Information)  Patient interviewed and appropriate assessments performed Referred patient to community resources care guide team for assistance with hazards at home: leaking roof Advised patient that he should receive a telephone call from the [facility] team    Patient Goals/Self-Care Activities: Patient will self administer medications as prescribed Patient will attend church or other social activities Patient will continue to perform ADL's independently Patient will continue to perform IADL's independently Patient will call provider office for new concerns or questions Patient will call RN Care Manager as needed [phone] Patient will keep all medical appointments Patient will schedule dental care Patient will talk with [facility] [phone] regarding home repairs Patient will check and record blood pressure 3 times per week and will call PCP with any readings outside of recommended range Patient will check and record blood sugar daily and will call PCP with any readings outside of recommended range   Plan:Telephone follow up appointment with care management team member scheduled for:  06/27/21 with [facility] The patient has been provided with contact information for the care management team and has been advised to call with any health related questions or concerns.   Chong Sicilian, BSN, RN-BC Embedded Chronic Care Manager [facility] [place] Management Direct Dial: [phone]

## 2021-05-28 NOTE — Patient Instructions (Signed)
Visit Information  Patient Goals/Self-Care Activities: Patient will self administer medications as prescribed Patient will attend church or other social activities Patient will continue to perform ADL's independently Patient will continue to perform IADL's independently Patient will call provider office for new concerns or questions Patient will call [facility] as needed [phone] Patient will keep all medical appointments Patient will schedule dental care Patient will talk with [facility] [phone] regarding home repairs Patient will check and record blood pressure 3 times per week and will call PCP with any readings outside of recommended range Patient will check and record blood sugar daily and will call PCP with any readings outside of recommended range  The patient verbalized understanding of instructions, educational materials, and care plan provided today and declined offer to receive copy of patient instructions, educational materials, and care plan.   Plan:Telephone follow up appointment with care management team member scheduled for:  06/27/21 with RNCM The patient has been provided with contact information for the care management team and has been advised to call with any health related questions or concerns.   Chong Sicilian, BSN, RN-BC Embedded Chronic Care Manager [facility] / [place] Management Direct Dial: [phone]

## 2021-05-29 DIAGNOSIS — C61 Malignant neoplasm of prostate: Secondary | ICD-10-CM

## 2021-05-29 DIAGNOSIS — C775 Secondary and unspecified malignant neoplasm of intrapelvic lymph nodes: Secondary | ICD-10-CM | POA: Diagnosis not present

## 2021-05-29 DIAGNOSIS — E118 Type 2 diabetes mellitus with unspecified complications: Secondary | ICD-10-CM | POA: Diagnosis not present

## 2021-05-29 DIAGNOSIS — I10 Essential (primary) hypertension: Secondary | ICD-10-CM

## 2021-06-05 ENCOUNTER — Other Ambulatory Visit (HOSPITAL_COMMUNITY): Payer: Self-pay | Admitting: *Deleted

## 2021-06-05 DIAGNOSIS — C775 Secondary and unspecified malignant neoplasm of intrapelvic lymph nodes: Secondary | ICD-10-CM

## 2021-06-05 DIAGNOSIS — C61 Malignant neoplasm of prostate: Secondary | ICD-10-CM

## 2021-06-05 NOTE — Progress Notes (Signed)
[name], [place] [address]   CLINIC:  Medical Oncology/Hematology  PCP:  [name], MD [address] 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 Report date is 05/07/2021.  The Multi-Cancer + RNA Panel offered by [facility] includes sequencing and/or deletion/duplication analysis of the following 84 genes:  AIP*, ALK, APC*, ATM*, AXIN2*, BAP1*, BARD1*, BLM*, BMPR1A*, BRCA1*, BRCA2*, BRIP1*, CASR, CDC73*, CDH1*, CDK4, CDKN1B*, CDKN1C*, CDKN2A, CEBPA, CHEK2*, CTNNA1*, DICER1*, DIS3L2*, EGFR, EPCAM, FH*, FLCN*, GATA2*, GPC3, GREM1, HOXB13, HRAS, KIT, MAX*, MEN1*, MET, MITF, MLH1*, MSH2*, MSH3*, MSH6*, MUTYH*, NBN*, NF1*, NF2*, NTHL1*, PALB2*, PDGFRA, PHOX2B, PMS2*, POLD1*, POLE*, POT1*, PRKAR1A*, PTCH1*, PTEN*, RAD50*, RAD51C*, RAD51D*, RB1*, RECQL4, RET, RUNX1*, SDHA*, SDHAF2*, SDHB*, SDHC*, SDHD*, SMAD4*, SMARCA4*, SMARCB1*, SMARCE1*, STK11*, SUFU*, TERC, TERT, TMEM127*, Tp53*, TSC1*, TSC2*, VHL*, WRN*, and WT1.  RNA analysis is performed for * genes.     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, a 75 y.o. male, returns for routine follow-up of his metastatic castration  refractory prostate cancer. Andrew [name] was last seen on 04/29/2021.   Today he reports feeling good. He has lost 5 pounds since 11/02. He reports occasional right shoulder pain.   REVIEW OF SYSTEMS:  Review of Systems  Constitutional:  Negative for appetite change (75%) and fatigue (50%).  Musculoskeletal:  Positive for arthralgias (R shoulder).  Neurological:  Positive for headaches.  All other systems reviewed and are negative.  PAST MEDICAL/SURGICAL HISTORY:  Past Medical History:  Diagnosis Date   Anemia    BPH (benign prostatic hyperplasia)    Sees Dr [name]   Cataract    Diabetes mellitus without complication ([place])    Hyperlipidemia    Hypertension    Low serum vitamin D    Prostate cancer ([place]) 2008   w/seed implantation and radiation   Past Surgical History:  Procedure Laterality Date   COLONOSCOPY  2015,2018   PROSTATE SURGERY  2008   seed implant    SOCIAL HISTORY:  Social History   Socioeconomic History   Marital status: Married    Spouse name: Andrew [name]   Number of children: 2   Years of education: 12   Highest education level: 12th grade  Occupational History   Occupation: [name]    Comment: Retired Geologist, engineering  Tobacco Use   Smoking status: Every Day    Packs/day: 1.00    Years: 55.00    Pack years: 55.00    Types: Cigarettes    Start date: 06/30/1965   Smokeless tobacco: Never   Tobacco comments:    Has tried nicotine replacement gum  Vaping Use   Vaping Use: Never used  Substance and Sexual Activity   Alcohol use: No   Drug use: No   Sexual activity:  Yes  Other Topics Concern   Not on file  Social History Narrative   Retired, lives at home with wife [name], two grown children, enjoys gardening     Social Determinants of Radio broadcast assistant Strain: Low Risk    Difficulty of Paying Living Expenses: Not hard at all  Food Insecurity: No Food Insecurity   Worried About Charity fundraiser in the Last Year: Never true   Academic librarian in the Last Year: Never true  Transportation Needs: No Transportation Needs   Lack of Transportation (Medical): No   Lack of Transportation (Non-Medical): No  Physical Activity: Inactive   Days of Exercise per Week: 0 days   Minutes of Exercise per Session: 0 min  Stress: No Stress Concern Present   Feeling of Stress : Not at all  Social Connections: Socially Integrated   Frequency of Communication with Friends and Family: More than three times a week   Frequency of Social Gatherings with Friends and Family: More than three times a week   Attends Religious Services: More than 4 times per year   Active Member of [employer] or Organizations: Yes   Attends Music therapist: More than 4 times per year   Marital Status: Married  Human resources officer Violence: Not At Risk   Fear of Current or Ex-Partner: No   Emotionally Abused: No   Physically Abused: No   Sexually Abused: No    FAMILY HISTORY:  Family History  Problem Relation Age of Onset   Diabetes Mother    Heart disease Mother    Kidney disease Mother        DIALYSIS   Emphysema Father    Deep vein thrombosis Sister    Kidney disease Sister    Lung cancer Sister    Colon polyps Brother    Hypertension Brother    Gout Brother    Colon cancer Neg Hx    Esophageal cancer Neg Hx    Rectal cancer Neg Hx    Prostate cancer Neg Hx     CURRENT MEDICATIONS:  Current Outpatient Medications  Medication Sig Dispense Refill   alfuzosin (UROXATRAL) 10 MG 24 hr tablet Take 1 tablet (10 mg total) by mouth at bedtime. 30 tablet 11   aspirin 81 MG tablet Take 81 mg by mouth daily.     atorvastatin (LIPITOR) 40 MG tablet TAKE 1 TABLET BY MOUTH  DAILY 90 tablet 3   cholecalciferol (VITAMIN D) 1000 UNITS tablet Take 1,000 Units by mouth daily.      clotrimazole-betamethasone (LOTRISONE) cream Apply 1 application topically 2 (two) times daily. 30 g 0   dronabinol (MARINOL) 5 MG capsule Take 1 capsule (5 mg total) by mouth 2  (two) times daily before a meal. 60 capsule 3   glucose blood test strip 1 each by Other route as needed for other. Use as instructed to test blood sugar daily.  One Touch Verio Test Strips DX: E11.65 100 each 11   lisinopril-hydrochlorothiazide (ZESTORETIC) 20-25 MG tablet TAKE 1 TABLET BY MOUTH  DAILY 90 tablet 3   metFORMIN (GLUCOPHAGE) 1000 MG tablet Take 1 tablet (1,000 mg total) by mouth 2 (two) times daily with a meal. 180 tablet 3   No current facility-administered medications for this visit.    ALLERGIES:  No Known Allergies  PHYSICAL EXAM:  Performance status (ECOG): 1 - Symptomatic but completely ambulatory  Vitals:   06/06/21 1024  BP: 108/70  Pulse: 67  Resp: 20  Temp: (!) 96.6 F (35.9 C)  SpO2: 97%   Wt Readings from Last 3 Encounters:  06/06/21 196 lb 10.4 oz (89.2 kg)  05/01/21 201 lb 12.8 oz (91.5 kg)  04/29/21 203 lb 8 oz (92.3 kg)   Physical Exam Vitals reviewed.  Constitutional:      Appearance: Normal appearance.  Cardiovascular:     Rate and Rhythm: Normal rate and regular rhythm.     Pulses: Normal pulses.     Heart sounds: Normal heart sounds.  Pulmonary:     Effort: Pulmonary effort is normal.     Breath sounds: Normal breath sounds.  Neurological:     General: No focal deficit present.     Mental Status: He is alert and oriented to person, place, and time.  Psychiatric:        Mood and Affect: Mood normal.        Behavior: Behavior normal.     LABORATORY DATA:  I have reviewed the labs as listed.  CBC Latest Ref Rng & Units 06/06/2021 05/01/2021 04/08/2021  WBC 4.0 - 10.5 K/uL 6.0 6.1 6.3  Hemoglobin 13.0 - 17.0 g/dL 11.2(L) 11.3(L) 12.0(L)  Hematocrit 39.0 - 52.0 % 33.2(L) 33.3(L) 36.0(L)  Platelets 150 - 400 K/uL 201 230 221   CMP Latest Ref Rng & Units 06/06/2021 05/01/2021 02/27/2021  Glucose 70 - 99 mg/dL 132(H) 102(H) 102(H)  BUN 8 - 23 mg/dL _0 Creatinine 0.61 - 1.24 mg/dL 1.32(H) 1.29(H) 1.30(H)  Sodium 135 - 145 mmol/L  138 139 139  Potassium 3.5 - 5.1 mmol/L 3.7 3.9 3.8  Chloride 98 - 111 mmol/L 105 102 100  CO2 22 - 32 mmol/L _1 Calcium 8.9 - 10.3 mg/dL 8.6(L) 9.2 9.2  Total Protein 6.5 - 8.1 g/dL 6.7 6.4 -  Total Bilirubin 0.3 - 1.2 mg/dL 0.5 0.4 -  Alkaline Phos 38 - 126 U/L 94 99 -  AST 15 - 41 U/L 22 24 -  ALT 0 - 44 U/L 16 11 -    DIAGNOSTIC IMAGING:  I have independently reviewed the scans and discussed with the patient. No results found.   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  CTAP on 08/26/2017 shows enlarged left pelvic sidewall lymph node 2.2 cm. - Lupron initiated on 08/10/2019. - CTAP on 03/09/2020 with left internal/external iliac lymph node measuring 2.1 x 2.8 cm (2.1 x 2.2 cm), craniocaudal 4.1 cm, previously 3.4 cm. - Enzalutamide to 160 mg daily started on 04/17/2020. - PSA on 02/20/2021 (5.2), 07/11/2020 (2.0), 02/08/2020 (3.1) - CTAP on 03/07/2021 with left iliac bifurcation lymph node measuring 3.5 x 2.6 cm (previously 2.8 x 2.0 cm) - Bone scan on 03/07/2021 negative. - Left pelvic lymph node biopsy on 04/08/2021, metastatic prostate cancer. - We have reviewed PSMA PET scan results which showed enlarged left iliac lymph node 33 mm with SUV 285.  1 mm lymph node and 4 mm node between IVC and aorta.  Activity in the right acetabulum, left iliac bone, left transverse process of T11 vertebral body, spine of the right scapula.  2.  Social/family history: - He lives at home with his wife.  He is able to do household activities including  working in the garden and Film/video editor.  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 - He is current active smoker, 1 pack/day for 55  years. - Sister died of lung cancer.   PLAN:  Metastatic castration refractory prostate cancer to the left iliac lymph node: - NGS testing could not be done because of less than 100 tumor cells. - Germline mutation testing was sent for PARP inhibitor therapy.  Results are pending. - Recommend sending guardant 360 for MSI, TMB, HRRm. - PSA today is 2.04. - RTC 3 to 4 weeks.  If no mutations, consider Zytiga and prednisone.   2.  Smoking history: - He has 55-pack-year smoking history and current active smoker. - CT part of the PET scan on 04/24/2021 did not show any significant changes.  3.  Loss of appetite: - He has lost 5 pounds since last visit.  Decreased appetite present. - We will start him on Marinol 5 mg twice daily.  4.  Bone metastasis: - I have recommended denosumab monthly.  He will have dental work done prior to initiation.   Orders placed this encounter:  No orders of the defined types were placed in this encounter.    Derek Jack, MD [facility] [phone]   I, Thana Ates, am acting as a scribe for Dr. Derek Jack.  I, Derek Jack MD, have reviewed the above documentation for accuracy and completeness, and I agree with the above.

## 2021-06-06 ENCOUNTER — Inpatient Hospital Stay (HOSPITAL_COMMUNITY): Payer: Medicare Other

## 2021-06-06 ENCOUNTER — Inpatient Hospital Stay (HOSPITAL_COMMUNITY): Payer: Medicare Other | Attending: Hematology | Admitting: Hematology

## 2021-06-06 ENCOUNTER — Other Ambulatory Visit: Payer: Self-pay

## 2021-06-06 VITALS — BP 108/70 | HR 67 | Temp 96.6°F | Resp 20 | Ht 67.32 in | Wt 196.7 lb

## 2021-06-06 DIAGNOSIS — Z7982 Long term (current) use of aspirin: Secondary | ICD-10-CM | POA: Diagnosis not present

## 2021-06-06 DIAGNOSIS — C61 Malignant neoplasm of prostate: Secondary | ICD-10-CM

## 2021-06-06 DIAGNOSIS — M25511 Pain in right shoulder: Secondary | ICD-10-CM | POA: Insufficient documentation

## 2021-06-06 DIAGNOSIS — F17210 Nicotine dependence, cigarettes, uncomplicated: Secondary | ICD-10-CM | POA: Diagnosis not present

## 2021-06-06 DIAGNOSIS — C775 Secondary and unspecified malignant neoplasm of intrapelvic lymph nodes: Secondary | ICD-10-CM | POA: Diagnosis not present

## 2021-06-06 DIAGNOSIS — Z7984 Long term (current) use of oral hypoglycemic drugs: Secondary | ICD-10-CM | POA: Insufficient documentation

## 2021-06-06 DIAGNOSIS — Z79899 Other long term (current) drug therapy: Secondary | ICD-10-CM | POA: Insufficient documentation

## 2021-06-06 DIAGNOSIS — C7951 Secondary malignant neoplasm of bone: Secondary | ICD-10-CM | POA: Insufficient documentation

## 2021-06-06 LAB — CBC WITH DIFFERENTIAL/PLATELET
Abs Immature Granulocytes: 0.04 10*3/uL (ref 0.00–0.07)
Basophils Absolute: 0 10*3/uL (ref 0.0–0.1)
Basophils Relative: 1 %
Eosinophils Absolute: 0.3 10*3/uL (ref 0.0–0.5)
Eosinophils Relative: 5 %
HCT: 33.2 % — ABNORMAL LOW (ref 39.0–52.0)
Hemoglobin: 11.2 g/dL — ABNORMAL LOW (ref 13.0–17.0)
Immature Granulocytes: 1 %
Lymphocytes Relative: 33 %
Lymphs Abs: 2 10*3/uL (ref 0.7–4.0)
MCH: 31.7 pg (ref 26.0–34.0)
MCHC: 33.7 g/dL (ref 30.0–36.0)
MCV: 94.1 fL (ref 80.0–100.0)
Monocytes Absolute: 0.4 10*3/uL (ref 0.1–1.0)
Monocytes Relative: 7 %
Neutro Abs: 3.2 10*3/uL (ref 1.7–7.7)
Neutrophils Relative %: 53 %
Platelets: 201 10*3/uL (ref 150–400)
RBC: 3.53 MIL/uL — ABNORMAL LOW (ref 4.22–5.81)
RDW: 13.5 % (ref 11.5–15.5)
WBC: 6 10*3/uL (ref 4.0–10.5)
nRBC: 0 % (ref 0.0–0.2)

## 2021-06-06 LAB — PSA: Prostatic Specific Antigen: 2.04 ng/mL (ref 0.00–4.00)

## 2021-06-06 LAB — COMPREHENSIVE METABOLIC PANEL
ALT: 16 U/L (ref 0–44)
AST: 22 U/L (ref 15–41)
Albumin: 3.3 g/dL — ABNORMAL LOW (ref 3.5–5.0)
Alkaline Phosphatase: 94 U/L (ref 38–126)
Anion gap: 7 (ref 5–15)
BUN: 18 mg/dL (ref 8–23)
CO2: 26 mmol/L (ref 22–32)
Calcium: 8.6 mg/dL — ABNORMAL LOW (ref 8.9–10.3)
Chloride: 105 mmol/L (ref 98–111)
Creatinine, Ser: 1.32 mg/dL — ABNORMAL HIGH (ref 0.61–1.24)
GFR, Estimated: 56 mL/min — ABNORMAL LOW (ref >60)
Glucose, Bld: 132 mg/dL — ABNORMAL HIGH (ref 70–99)
Potassium: 3.7 mmol/L (ref 3.5–5.1)
Sodium: 138 mmol/L (ref 135–145)
Total Bilirubin: 0.5 mg/dL (ref 0.3–1.2)
Total Protein: 6.7 g/dL (ref 6.5–8.1)

## 2021-06-06 MED ORDER — DRONABINOL 5 MG PO CAPS
5.0000 mg | ORAL_CAPSULE | Freq: Two times a day (BID) | ORAL | 3 refills | Status: DC
Start: 2021-06-06 — End: 2021-10-11

## 2021-06-06 NOTE — Progress Notes (Signed)
Guardant 360 labs drawn today per Dr. Delton Coombes order. Patient labs drawn using 23G butterfly needle from R AC. Patient tolerated well and without complaint. Patient discharged from facility in stable condition, ambulatory and with wife.

## 2021-06-06 NOTE — Patient Instructions (Addendum)
[place] at [facility] Discharge Instructions   You were seen and examined today by Dr. Delton Coombes.  We will draw a special blood test today to see what treatment options may be available.  We will see you back in 3 to 4 weeks to discuss the results of this test.    Thank you for choosing [place] at [facility] to provide your oncology and hematology care.  To afford each patient quality time with our provider, please arrive at least 15 minutes before your scheduled appointment time.   If you have a lab appointment with the [facility] please come in thru the Main Entrance and check in at the main information desk.  You need to re-schedule your appointment should you arrive 10 or more minutes late.  We strive to give you quality time with our providers, and arriving late affects you and other patients whose appointments are after yours.  Also, if you no show three or more times for appointments you may be dismissed from the clinic at the providers discretion.     Again, thank you for choosing [facility].  Our hope is that these requests will decrease the amount of time that you wait before being seen by our physicians.       _____________________________________________________________  Should you have questions after your visit to [facility], please contact our office at [phone] and follow the prompts.  Our office hours are 8:00 a.m. and 4:30 p.m. Monday - Friday.  Please note that voicemails left after 4:00 p.m. may not be returned until the following business day.  We are closed weekends and major holidays.  You do have access to a nurse 24-7, just call the main number to the clinic [phone] and do not press any options, hold on the line and a nurse will answer the phone.    For prescription refill requests, have your pharmacy contact our office and allow 72 hours.    Due to Covid, you will need to  wear a mask upon entering the hospital. If you do not have a mask, a mask will be given to you at the Main Entrance upon arrival. For doctor visits, patients may have 1 support person age 66 or older with them. For treatment visits, patients can not have anyone with them due to social distancing guidelines and our immunocompromised population.

## 2021-06-10 ENCOUNTER — Encounter (HOSPITAL_COMMUNITY): Payer: Self-pay | Admitting: *Deleted

## 2021-06-10 NOTE — Progress Notes (Signed)
Received approval for Dronabinol 5 mg caps through 12/05/2021.  Approval letter to be scanned to Epic.

## 2021-06-27 ENCOUNTER — Telehealth: Payer: Medicare Other

## 2021-07-03 NOTE — Progress Notes (Signed)
[place], [place] [address]   CLINIC:  Medical Oncology/Hematology  PCP:  Andrew Fraise, MD [address] [place] [address] [phone]   REASON FOR VISIT:  Follow-up for metastatic castration refractory prostate cancer  PRIOR THERAPY: neoadjuvant hormonal therapy, EBRT followed by brachytherapy with iodine 125 seeds in 2010  NGS Results: not done  CURRENT THERAPY: Lupron injection (started 08/10/2019) and enzalutamide 160 mg daily (started 04/17/2020)  BRIEF ONCOLOGIC HISTORY:  Oncology History  Prostate cancer metastatic to intrapelvic lymph node ([place])  04/07/2014 Initial Diagnosis   Prostate cancer metastatic to intrapelvic lymph node ([place])    Genetic Testing   [facility] Multi-Cancer Panel was Negative. Report date is 05/07/2021.  The Multi-Cancer + RNA Panel offered by [employer] includes sequencing and/or deletion/duplication analysis of the following 84 genes:  AIP*, ALK, APC*, ATM*, AXIN2*, BAP1*, BARD1*, BLM*, BMPR1A*, BRCA1*, BRCA2*, BRIP1*, CASR, CDC73*, CDH1*, CDK4, CDKN1B*, CDKN1C*, CDKN2A, CEBPA, CHEK2*, CTNNA1*, DICER1*, DIS3L2*, EGFR, EPCAM, FH*, FLCN*, GATA2*, GPC3, GREM1, HOXB13, HRAS, KIT, MAX*, MEN1*, MET, MITF, MLH1*, MSH2*, MSH3*, MSH6*, MUTYH*, NBN*, NF1*, NF2*, NTHL1*, PALB2*, PDGFRA, PHOX2B, PMS2*, POLD1*, POLE*, POT1*, PRKAR1A*, PTCH1*, PTEN*, RAD50*, RAD51C*, RAD51D*, RB1*, RECQL4, RET, RUNX1*, SDHA*, SDHAF2*, SDHB*, SDHC*, SDHD*, SMAD4*, SMARCA4*, SMARCB1*, SMARCE1*, STK11*, SUFU*, TERC, TERT, TMEM127*, Tp53*, TSC1*, TSC2*, VHL*, WRN*, and WT1.  RNA analysis is performed for * genes.     CANCER STAGING:  Cancer Staging  Prostate cancer metastatic to intrapelvic lymph node [facility]) Staging form: Prostate, [name] 7th Edition - Clinical stage from 04/02/2021: rT2b, N1, PSA: 10 to 19, Gleason 7 - Unsigned   INTERVAL HISTORY:  [name], a 76 y.o. male, returns for routine follow-up of his metastatic castration  refractory prostate cancer. Andrew [name] was last seen on 06/06/2021.   Today he reports feeling good. He reports stable constipation and SOB. He took 1 Marinol tablet, and his appetite improved; he has not taken any more Marinol since. He has gained 3 lbs since 12/8. He has a history of DM.   REVIEW OF SYSTEMS:  Review of Systems  Constitutional:  Negative for appetite change, fatigue and unexpected weight change (+3 lbs).  Respiratory:  Positive for shortness of breath.   Gastrointestinal:  Positive for constipation.  Musculoskeletal:  Positive for arthralgias (R shoulder).  Psychiatric/Behavioral:  Positive for sleep disturbance.   All other systems reviewed and are negative.  PAST MEDICAL/SURGICAL HISTORY:  Past Medical History:  Diagnosis Date   Anemia    BPH (benign prostatic hyperplasia)    Sees Dr Andrew [name]   Cataract    Diabetes mellitus without complication ([place])    Hyperlipidemia    Hypertension    Low serum vitamin D    Prostate cancer ([place]) 2008   w/seed implantation and radiation   Past Surgical History:  Procedure Laterality Date   COLONOSCOPY  2015,2018   PROSTATE SURGERY  2008   seed implant    SOCIAL HISTORY:  Social History   Socioeconomic History   Marital status: Married    Spouse name: Andrew [name]   Number of children: 2   Years of education: 12   Highest education level: 12th grade  Occupational History   Occupation: [name]    Comment: Retired Geologist, engineering  Tobacco Use   Smoking status: Every Day    Packs/day: 1.00    Years: 55.00    Pack years: 55.00    Types: Cigarettes    Start date: 06/30/1965  Smokeless tobacco: Never   Tobacco comments:    Has tried nicotine replacement gum  Vaping Use   Vaping Use: Never used  Substance and Sexual Activity   Alcohol use: No   Drug use: No   Sexual activity: Yes  Other Topics Concern   Not on file  Social History Narrative   Retired, lives at home with wife Andrew [name], two grown children, enjoys gardening      Social Determinants of Radio broadcast assistant Strain: Low Risk    Difficulty of Paying Living Expenses: Not hard at all  Food Insecurity: No Food Insecurity   Worried About Charity fundraiser in the Last Year: Never true   Arboriculturist in the Last Year: Never true  Transportation Needs: No Transportation Needs   Lack of Transportation (Medical): No   Lack of Transportation (Non-Medical): No  Physical Activity: Inactive   Days of Exercise per Week: 0 days   Minutes of Exercise per Session: 0 min  Stress: No Stress Concern Present   Feeling of Stress : Not at all  Social Connections: Socially Integrated   Frequency of Communication with Friends and Family: More than three times a week   Frequency of Social Gatherings with Friends and Family: More than three times a week   Attends Religious Services: More than 4 times per year   Active Member of [employer] or Organizations: Yes   Attends Music therapist: More than 4 times per year   Marital Status: Married  Human resources officer Violence: Not At Risk   Fear of Current or Ex-Partner: No   Emotionally Abused: No   Physically Abused: No   Sexually Abused: No    FAMILY HISTORY:  Family History  Problem Relation Age of Onset   Diabetes Mother    Heart disease Mother    [name] disease Mother        DIALYSIS   Emphysema Father    Deep vein thrombosis Sister    [name] disease Sister    Lung cancer Sister    Colon polyps Brother    Hypertension Brother    Gout Brother    Colon cancer Neg Hx    Esophageal cancer Neg Hx    Rectal cancer Neg Hx    Prostate cancer Neg Hx     CURRENT MEDICATIONS:  Current Outpatient Medications  Medication Sig Dispense Refill   alfuzosin (UROXATRAL) 10 MG 24 hr tablet Take 1 tablet (10 mg total) by mouth at bedtime. 30 tablet 11   aspirin 81 MG tablet Take 81 mg by mouth daily.     atorvastatin (LIPITOR) 40 MG tablet TAKE 1 TABLET BY MOUTH  DAILY 90 tablet 3   cholecalciferol  (VITAMIN D) 1000 UNITS tablet Take 1,000 Units by mouth daily.      clotrimazole-betamethasone (LOTRISONE) cream Apply 1 application topically 2 (two) times daily. 30 g 0   dronabinol (MARINOL) 5 MG capsule Take 1 capsule (5 mg total) by mouth 2 (two) times daily before a meal. 60 capsule 3   glucose blood test strip 1 each by Other route as needed for other. Use as instructed to test blood sugar daily.  One Touch Verio Test Strips DX: E11.65 100 each 11   lisinopril-hydrochlorothiazide (ZESTORETIC) 20-25 MG tablet TAKE 1 TABLET BY MOUTH  DAILY 90 tablet 3   metFORMIN (GLUCOPHAGE) 1000 MG tablet Take 1 tablet (1,000 mg total) by mouth 2 (two) times daily with a meal. 180 tablet 3  No current facility-administered medications for this visit.    ALLERGIES:  No Known Allergies  PHYSICAL EXAM:  Performance status (ECOG): 1 - Symptomatic but completely ambulatory  Vitals:   07/04/21 1128  BP: 107/64  Pulse: 78  Resp: 18  Temp: 98.4 F (36.9 C)  SpO2: 100%   Wt Readings from Last 3 Encounters:  07/04/21 199 lb 8 oz (90.5 kg)  06/06/21 196 lb 10.4 oz (89.2 kg)  05/01/21 201 lb 12.8 oz (91.5 kg)   Physical Exam Vitals reviewed.  Constitutional:      Appearance: Normal appearance.  Cardiovascular:     Rate and Rhythm: Normal rate and regular rhythm.     Pulses: Normal pulses.     Heart sounds: Normal heart sounds.  Pulmonary:     Effort: Pulmonary effort is normal.     Breath sounds: Normal breath sounds.  Neurological:     General: No focal deficit present.     Mental Status: He is alert and oriented to person, place, and time.  Psychiatric:        Mood and Affect: Mood normal.        Behavior: Behavior normal.     LABORATORY DATA:  I have reviewed the labs as listed.  CBC Latest Ref Rng & Units 06/06/2021 05/01/2021 04/08/2021  WBC 4.0 - 10.5 K/uL 6.0 6.1 6.3  Hemoglobin 13.0 - 17.0 g/dL 11.2(L) 11.3(L) 12.0(L)  Hematocrit 39.0 - 52.0 % 33.2(L) 33.3(L) 36.0(L)   Platelets 150 - 400 K/uL 201 230 221   CMP Latest Ref Rng & Units 06/06/2021 05/01/2021 02/27/2021  Glucose 70 - 99 mg/dL 132(H) 102(H) 102(H)  BUN 8 - 23 mg/dL '18 16 13  ' Creatinine 0.61 - 1.24 mg/dL 1.32(H) 1.29(H) 1.30(H)  Sodium 135 - 145 mmol/L 138 139 139  Potassium 3.5 - 5.1 mmol/L 3.7 3.9 3.8  Chloride 98 - 111 mmol/L 105 102 100  CO2 22 - 32 mmol/L '26 24 25  ' Calcium 8.9 - 10.3 mg/dL 8.6(L) 9.2 9.2  Total Protein 6.5 - 8.1 g/dL 6.7 6.4 -  Total Bilirubin 0.3 - 1.2 mg/dL 0.5 0.4 -  Alkaline Phos 38 - 126 U/L 94 99 -  AST 15 - 41 U/L 22 24 -  ALT 0 - 44 U/L 16 11 -    DIAGNOSTIC IMAGING:  I have independently reviewed the scans and discussed with the patient. No results found.   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  CTAP on 08/26/2017 shows enlarged left pelvic sidewall lymph node 2.2 cm. - Lupron initiated on 08/10/2019. - CTAP on 03/09/2020 with left internal/external iliac lymph node measuring 2.1 x 2.8 cm (2.1 x 2.2 cm), craniocaudal 4.1 cm, previously 3.4 cm. - Enzalutamide to 160 mg daily started on 04/17/2020.  Discontinued end of October 2022. - PSA on 02/20/2021 (5.2), 07/11/2020 (2.0), 02/08/2020 (3.1) - CTAP on 03/07/2021 with left iliac bifurcation lymph node measuring 3.5 x 2.6 cm (previously 2.8 x 2.0 cm) - Bone scan on 03/07/2021 negative. - Left pelvic lymph node biopsy on 04/08/2021, metastatic prostate cancer. - We have reviewed PSMA PET scan results which showed enlarged left iliac lymph node 33 mm with SUV 285.  1 mm lymph node and 4 mm node between  IVC and aorta.  Activity in the right acetabulum, left iliac bone, left transverse process of T11 vertebral body, spine of the right  scapula. - Abiraterone and prednisone prescribed on 07/04/2021. - Guardant 360 did not show any targetable mutations.  2.  Social/family history: - He lives at home with his wife.  He is able to do household activities including working in the garden and Film/video editor.  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 - He is current active smoker, 1 pack/day for 55 years. - Sister died of lung cancer.   PLAN:  Metastatic castration refractory prostate cancer to the left iliac lymph node: - NGS testing could not be done because of less than 100 tumor cells. - We have reviewed guardant 360 results which did not show MSI high.  No other targetable mutations were seen. - Genetic testing results are pending at this time. - Last PSA was 2.04 on 06/06/2021. - At this point we talked about initiating him on Abiraterone with prednisone. - We will start him on Abiraterone 1000 mg in the mornings on an empty stomach.  We discussed side effects in detail including hypertension, hypokalemia, fatigue, hot flashes among others. - He will start prednisone 5 mg daily when he starts Abiraterone. - I will see him back in 2 weeks for follow-up after initiation of Abiraterone.  We plan to repeat PSA in 2 months from the start of Abiraterone.   2.  Smoking history: - He has 55-pack-year smoking history and current active smoker. - CT part of the PET scan on 04/24/2021 did not show any significant changes.  3.  Loss of appetite: - Continue Marinol 5 mg twice daily. - His weight has gone up by 3 pounds since last visit.  4.  Bone metastasis: - I have recommended denosumab monthly.  We discussed side effects in detail. - He will have dental work done prior to start of therapy.   Orders placed this encounter:  No orders of the defined types were placed in this encounter.    Derek Jack, MD [place] [phone]   I, Thana Ates, am acting as a scribe for  Dr. Derek Jack.  I, Derek Jack MD, have reviewed the above documentation for accuracy and completeness, and I agree with the above.

## 2021-07-04 ENCOUNTER — Inpatient Hospital Stay (HOSPITAL_COMMUNITY): Payer: Medicare HMO | Attending: Hematology | Admitting: Hematology

## 2021-07-04 ENCOUNTER — Telehealth (HOSPITAL_COMMUNITY): Payer: Self-pay | Admitting: Pharmacy Technician

## 2021-07-04 ENCOUNTER — Telehealth (HOSPITAL_COMMUNITY): Payer: Self-pay | Admitting: Pharmacist

## 2021-07-04 ENCOUNTER — Other Ambulatory Visit (HOSPITAL_COMMUNITY): Payer: Self-pay

## 2021-07-04 ENCOUNTER — Other Ambulatory Visit: Payer: Self-pay

## 2021-07-04 VITALS — BP 107/64 | HR 78 | Temp 98.4°F | Resp 18 | Ht 66.0 in | Wt 199.5 lb

## 2021-07-04 DIAGNOSIS — D649 Anemia, unspecified: Secondary | ICD-10-CM | POA: Insufficient documentation

## 2021-07-04 DIAGNOSIS — Z7952 Long term (current) use of systemic steroids: Secondary | ICD-10-CM | POA: Diagnosis not present

## 2021-07-04 DIAGNOSIS — R69 Illness, unspecified: Secondary | ICD-10-CM | POA: Diagnosis not present

## 2021-07-04 DIAGNOSIS — C61 Malignant neoplasm of prostate: Secondary | ICD-10-CM | POA: Insufficient documentation

## 2021-07-04 DIAGNOSIS — C7951 Secondary malignant neoplasm of bone: Secondary | ICD-10-CM | POA: Insufficient documentation

## 2021-07-04 DIAGNOSIS — Z7982 Long term (current) use of aspirin: Secondary | ICD-10-CM | POA: Insufficient documentation

## 2021-07-04 DIAGNOSIS — Z79899 Other long term (current) drug therapy: Secondary | ICD-10-CM | POA: Diagnosis not present

## 2021-07-04 DIAGNOSIS — E119 Type 2 diabetes mellitus without complications: Secondary | ICD-10-CM | POA: Insufficient documentation

## 2021-07-04 DIAGNOSIS — C775 Secondary and unspecified malignant neoplasm of intrapelvic lymph nodes: Secondary | ICD-10-CM | POA: Insufficient documentation

## 2021-07-04 DIAGNOSIS — Z801 Family history of malignant neoplasm of trachea, bronchus and lung: Secondary | ICD-10-CM | POA: Diagnosis not present

## 2021-07-04 DIAGNOSIS — F17210 Nicotine dependence, cigarettes, uncomplicated: Secondary | ICD-10-CM | POA: Insufficient documentation

## 2021-07-04 DIAGNOSIS — Z7984 Long term (current) use of oral hypoglycemic drugs: Secondary | ICD-10-CM | POA: Insufficient documentation

## 2021-07-04 DIAGNOSIS — Z923 Personal history of irradiation: Secondary | ICD-10-CM | POA: Insufficient documentation

## 2021-07-04 DIAGNOSIS — I10 Essential (primary) hypertension: Secondary | ICD-10-CM | POA: Insufficient documentation

## 2021-07-04 MED ORDER — PREDNISONE 5 MG PO TABS: 5.0000 mg | ORAL_TABLET | Freq: Every day | ORAL | 6 refills | Status: DC

## 2021-07-04 MED ORDER — ABIRATERONE ACETATE 250 MG PO TABS
1000.0000 mg | ORAL_TABLET | Freq: Every day | ORAL | 2 refills | Status: DC
  Filled 2021-07-04 – 2021-07-05 (×2): qty 120, 30d supply, fill #0
  Filled 2021-07-29: qty 120, 30d supply, fill #1
  Filled 2021-08-23: qty 120, 30d supply, fill #2

## 2021-07-04 NOTE — Patient Instructions (Signed)
[place] at [facility] Discharge Instructions   You were seen and examined today by Dr. Delton Coombes.  He reviewed the results of the special blood test we did.  We will start you on a new pill called Zytiga.  You should take it daily on an empty stomach (1 hour before or 2 hours after meals).  We also sent a prescription for prednisone to your regular pharmacy.  You will take this daily with food once you start the [place].   Return as scheduled in 3 weeks.    Thank you for choosing [place] at [facility] to provide your oncology and hematology care.  To afford each patient quality time with our provider, please arrive at least 15 minutes before your scheduled appointment time.   If you have a lab appointment with the [facility] please come in thru the Main Entrance and check in at the main information desk.  You need to re-schedule your appointment should you arrive 10 or more minutes late.  We strive to give you quality time with our providers, and arriving late affects you and other patients whose appointments are after yours.  Also, if you no show three or more times for appointments you may be dismissed from the clinic at the providers discretion.     Again, thank you for choosing [facility].  Our hope is that these requests will decrease the amount of time that you wait before being seen by our physicians.       _____________________________________________________________  Should you have questions after your visit to [facility], please contact our office at [phone] and follow the prompts.  Our office hours are 8:00 a.m. and 4:30 p.m. Monday - Friday.  Please note that voicemails left after 4:00 p.m. may not be returned until the following business day.  We are closed weekends and major holidays.  You do have access to a nurse 24-7, just call the main number to the clinic [phone] and do not press  any options, hold on the line and a nurse will answer the phone.    For prescription refill requests, have your pharmacy contact our office and allow 72 hours.    Due to Covid, you will need to wear a mask upon entering the hospital. If you do not have a mask, a mask will be given to you at the Main Entrance upon arrival. For doctor visits, patients may have 1 support person age 1 or older with them. For treatment visits, patients can not have anyone with them due to social distancing guidelines and our immunocompromised population.

## 2021-07-04 NOTE — Telephone Encounter (Signed)
Oral Oncology Patient Advocate Encounter   Received notification from [facility] that prior authorization for Fabio Asa is required.   PA submitted on CoverMyMeds Key [id number] Status is pending   Oral Oncology Clinic will continue to follow.  [place] Patient [place] Phone [phone] Fax [phone] 07/04/2021 3:09 PM

## 2021-07-04 NOTE — Telephone Encounter (Signed)
Oral Oncology Patient Advocate Encounter  Prior Authorization for Andrew [name] has been approved.    PA# [id number] Effective dates: 06/30/21 through 06/29/22  Patients co-pay is $0.00  Oral Oncology Clinic will continue to follow.   [place] Patient [place] Phone [phone] Fax [phone] 07/04/2021 3:10 PM

## 2021-07-04 NOTE — Telephone Encounter (Signed)
Oral Oncology Pharmacist Encounter  Received new prescription for Zytiga (abiraterone) for the treatment of metastatic castration resistant prostate cancer in conjunction with prednisone and ADT, planned duration until disease progression or unacceptable drug toxicity.  CMP from 06/06/22 assessed, no relevant lab abnormalities. BP from 07/04/21 well controlled. Prescription dose and frequency assessed.   Current medication list in Epic reviewed, no DDIs with abiraterone identified.  Evaluated chart and no patient barriers to medication adherence identified. Patient has experience with taking oral chemotherapy.  Prescription has been e-scribed to the [facility] for benefits analysis and approval.  Oral Oncology Clinic will continue to follow for insurance authorization, copayment issues, initial counseling and start date.  Darl Pikes, PharmD, BCPS, BCOP, CPP Hematology/Oncology Clinical Pharmacist Practitioner [place]/DB/AP Oral [place] [phone]  07/04/2021 3:23 PM

## 2021-07-05 ENCOUNTER — Other Ambulatory Visit (HOSPITAL_COMMUNITY): Payer: Self-pay

## 2021-07-05 NOTE — Telephone Encounter (Signed)
Oral Chemotherapy Pharmacist Encounter  [place] will deliver his abiraterone on 07/08/21. Mr. Nee will begin taking his abiraterone on 07/09/21. He has not picked up his prednsione but plans on picking it up this weekend.   Patient Education I spoke with patient for overview of new oral chemotherapy medication: Zytiga (abiraterone) for the treatment of metastatic castration resistant prostate cancer in conjunction with prednisone and ADT, planned duration until disease progression or unacceptable drug toxicity.   Counseled patient on administration, dosing, side effects, monitoring, drug-food interactions, safe handling, storage, and disposal. Patient will take: Abiraterone: Take 4 tablets (1,000 mg total) by mouth daily. Take on an empty stomach 1 hour before or 2 hours after a meal. Prednisone: Take 1 tablet (5 mg total) by mouth daily with breakfast.  Side effects include but not limited to: HTN, fatigue, edema, decrease wbc.   HTN: Patient reports having a blood pressure cuff at home. He will check his blood pressure a few times per week once he has started his abiraterone and record his readings. Reviewed signs of high blood pressure (headache, dizziness).    Reviewed with patient importance of keeping a medication schedule and plan for any missed doses.  After discussion with patient no patient barriers to medication adherence identified.   Mr. Vora voiced understanding and appreciation. All questions answered. Medication handout provided.  Provided patient with Oral [place] [facility] phone number. Patient knows to call the office with questions or concerns. Oral Chemotherapy Navigation Clinic will continue to follow.  Darl Pikes, PharmD, BCPS, BCOP, CPP Hematology/Oncology Clinical Pharmacist Practitioner [place]/DB/AP Oral [place] [facility] [phone]  07/05/2021 12:01 PM

## 2021-07-09 DIAGNOSIS — B351 Tinea unguium: Secondary | ICD-10-CM | POA: Diagnosis not present

## 2021-07-09 DIAGNOSIS — M79676 Pain in unspecified toe(s): Secondary | ICD-10-CM | POA: Diagnosis not present

## 2021-07-09 DIAGNOSIS — L84 Corns and callosities: Secondary | ICD-10-CM | POA: Diagnosis not present

## 2021-07-09 DIAGNOSIS — E1142 Type 2 diabetes mellitus with diabetic polyneuropathy: Secondary | ICD-10-CM | POA: Diagnosis not present

## 2021-07-11 ENCOUNTER — Telehealth: Payer: Self-pay | Admitting: Family Medicine

## 2021-07-11 ENCOUNTER — Telehealth: Payer: Self-pay

## 2021-07-11 DIAGNOSIS — E1169 Type 2 diabetes mellitus with other specified complication: Secondary | ICD-10-CM

## 2021-07-11 DIAGNOSIS — E1165 Type 2 diabetes mellitus with hyperglycemia: Secondary | ICD-10-CM

## 2021-07-11 DIAGNOSIS — E785 Hyperlipidemia, unspecified: Secondary | ICD-10-CM

## 2021-07-11 DIAGNOSIS — I10 Essential (primary) hypertension: Secondary | ICD-10-CM

## 2021-07-11 MED ORDER — METFORMIN HCL 1000 MG PO TABS: 1000.0000 mg | ORAL_TABLET | Freq: Two times a day (BID) | ORAL | 0 refills | Status: DC

## 2021-07-11 MED ORDER — LISINOPRIL-HYDROCHLOROTHIAZIDE 20-25 MG PO TABS: 1.0000 | ORAL_TABLET | Freq: Every day | ORAL | 0 refills | Status: DC

## 2021-07-11 MED ORDER — ATORVASTATIN CALCIUM 40 MG PO TABS: 40.0000 mg | ORAL_TABLET | Freq: Every day | ORAL | 0 refills | Status: DC

## 2021-07-11 NOTE — Telephone Encounter (Signed)
Patient has recently changed insurances and so pharmacy has changed also.  Needs refills of Metformin, Atorvastatin and Lisinopril sent to [facility].  Meds sent to pharmacy.

## 2021-07-11 NOTE — Telephone Encounter (Signed)
Patient talked to Jan about his medication

## 2021-07-16 ENCOUNTER — Other Ambulatory Visit (HOSPITAL_COMMUNITY): Payer: Self-pay

## 2021-07-19 ENCOUNTER — Encounter: Payer: Self-pay | Admitting: *Deleted

## 2021-07-19 ENCOUNTER — Ambulatory Visit (INDEPENDENT_AMBULATORY_CARE_PROVIDER_SITE_OTHER): Payer: Medicare HMO | Admitting: *Deleted

## 2021-07-19 DIAGNOSIS — I10 Essential (primary) hypertension: Secondary | ICD-10-CM

## 2021-07-19 DIAGNOSIS — Z5919 Other inadequate housing: Secondary | ICD-10-CM

## 2021-07-19 DIAGNOSIS — C61 Malignant neoplasm of prostate: Secondary | ICD-10-CM

## 2021-07-19 DIAGNOSIS — Z77128 Contact with and (suspected) exposure to other hazards in the physical environment: Secondary | ICD-10-CM

## 2021-07-19 DIAGNOSIS — E1165 Type 2 diabetes mellitus with hyperglycemia: Secondary | ICD-10-CM

## 2021-07-19 DIAGNOSIS — C775 Secondary and unspecified malignant neoplasm of intrapelvic lymph nodes: Secondary | ICD-10-CM

## 2021-07-19 NOTE — Patient Instructions (Signed)
Visit Information  Patient Goals/Self-Care Activities: Patient will self administer medications as prescribed Patient will attend church or other social activities Patient will continue to perform ADL's independently Patient will continue to perform IADL's independently Patient will call provider office for new concerns or questions Patient will call [place] as needed [phone] Patient will keep all medical appointments Patient will schedule dental care Patient will talk with [facility] [phone] regarding home repairs Patient will check and record blood pressure 3 times per week and will call PCP with any readings outside of recommended range Patient will check and record blood sugar daily and will call PCP with any readings outside of recommended range Patient will complete and return [place] application  The patient verbalized understanding of instructions, educational materials, and care plan provided today and declined offer to receive copy of patient instructions, educational materials, and care plan.   Plan:Telephone follow up appointment with care management team member scheduled for:  08/20/21 with RNCM The patient has been provided with contact information for the care management team and has been advised to call with any health related questions or concerns.   Chong Sicilian, BSN, RN-BC Embedded Chronic Care Manager [facility] Direct Dial: [phone]

## 2021-07-19 NOTE — Chronic Care Management (AMB) (Signed)
Chronic Care Management   CCM RN Visit Note  07/19/2021 Name: Andrew [name] MRN: [id number] DOB: 1946/01/08  Subjective: Andrew [name] is a 76 y.o. year old male who is a primary care patient of Stacks, Cletus Gash, MD. The care management team was consulted for assistance with disease management and care coordination needs.    Engaged with patient by telephone for follow up visit in response to provider referral for case management and/or care coordination services.   Consent to Services:  The patient was given information about Chronic Care Management services, agreed to services, and gave verbal consent prior to initiation of services.  Please see initial visit note for detailed documentation.   Patient agreed to services and verbal consent obtained.   Assessment: Review of patient past medical history, allergies, medications, health status, including review of consultants reports, laboratory and other test data, was performed as part of comprehensive evaluation and provision of chronic care management services.   SDOH (Social Determinants of Health) assessments and interventions performed:    CCM Care Plan  No Known Allergies  Outpatient Encounter Medications as of 07/19/2021  Medication Sig Note   abiraterone acetate (ZYTIGA) 250 MG tablet Take 4 tablets (1,000 mg total) by mouth daily. Take on an empty stomach 1 hour before or 2 hours after a meal    alfuzosin (UROXATRAL) 10 MG 24 hr tablet Take 1 tablet (10 mg total) by mouth at bedtime.    aspirin 81 MG tablet Take 81 mg by mouth daily.    atorvastatin (LIPITOR) 40 MG tablet Take 1 tablet (40 mg total) by mouth daily.    cholecalciferol (VITAMIN D) 1000 UNITS tablet Take 1,000 Units by mouth daily.     clotrimazole-betamethasone (LOTRISONE) cream Apply 1 application topically 2 (two) times daily.    dronabinol (MARINOL) 5 MG capsule Take 1 capsule (5 mg total) by mouth 2 (two) times daily before a meal.    glucose blood test strip 1  each by Other route as needed for other. Use as instructed to test blood sugar daily.  One Touch Verio Test Strips DX: E11.65 10/17/2019: One Touch Verio   lisinopril-hydrochlorothiazide (ZESTORETIC) 20-25 MG tablet Take 1 tablet by mouth daily.    metFORMIN (GLUCOPHAGE) 1000 MG tablet Take 1 tablet (1,000 mg total) by mouth 2 (two) times daily with a meal.    predniSONE (DELTASONE) 5 MG tablet Take 1 tablet (5 mg total) by mouth daily with breakfast.    No facility-administered encounter medications on file as of 07/19/2021.    Patient Active Problem List   Diagnosis Date Noted   Genetic testing 05/09/2021   Aortic atherosclerosis ([place]) 01/18/2021   Primary insomnia 07/26/2020   Sacroiliitis ([place]) 07/26/2020   Nocturia 02/08/2020   Nonspecific abnormal electrocardiogram (ECG) (EKG) 07/18/2019   Type 2 diabetes mellitus with complication, without long-term current use of insulin ([place]) 07/18/2019   Educated about COVID-19 virus infection 07/18/2019   SOB (shortness of breath) 12/10/2016   Erectile dysfunction due to arterial insufficiency 10/30/2016   Polyp of colon 10/30/2016   Peripheral vascular insufficiency ([place]) 06/13/2016   Vitamin D deficiency 08/24/2015   Prostate cancer metastatic to intrapelvic lymph node ([place]) 04/07/2014   Diabetes mellitus type 2, uncontrolled 09/14/2010   Benign prostatic hyperplasia with urinary obstruction 09/14/2010   Essential hypertension 09/14/2010   Hyperlipidemia associated with type 2 diabetes mellitus ([place]) 09/14/2010   Tobacco abuse 09/14/2010    Conditions to be addressed/monitored:HTN, DMII, and metastatic prostate cancer, and  poor oral health  Care Plan : [facility] Care Plan  Updates made by Ilean China, RN since 07/19/2021 12:00 AM     Problem: Chronic Diseaes Managment Needs   Priority: High     Long-Range Goal: Work with [facility] Regarding Care Management and Care Coordination Associated with HTN, DM, metastatic prostate cancer, poor  oral health   Start Date: 02/22/2021  Expected End Date: 02/22/2022  This Visit's Progress: On track  Recent Progress: On track  Priority: High  Note:   Current Barriers:  Chronic Disease Management support and education needs related to poor oral health in a patient with hypertension, diabetes, and metastatic prostate cancer Chronic disease management and care coordination needs related to HTN, DM, and metastatic prostate cancer Unable to independently drive Hazards in home: roof is leaking Has not followed up with [facility] regarding home repairs Has not followed up with [place]):  Patient will continue to work with RN Care Manager to address care management and care coordination needs related to HTN, DMII, and , metastatic prostate cancer, poor oral health  through collaboration with RN Care manager/LCSW, provider, and care team.  Patient/wife will reach out to [facility] [phone] regarding home repairs. Patient will keep all oncology follow-up appointments Patient will schedule and keep appointment with dentist for evaluation and treatment Patient will complete application for [place]  Interventions: 1:1 collaboration with primary care provider regarding development and update of comprehensive plan of care as evidenced by provider attestation and co-signature Inter-disciplinary care team collaboration (see longitudinal plan of care) Evaluation of current treatment plan related to  self management and patient's adherence to plan as established by provider   Diabetes:  (Status: Condition stable. Not addressed this visit.) Lab Results  Component Value Date   HGBA1C 5.6 05/01/2021   HGBA1C 6.1 01/29/2021   HGBA1C 6.0 10/24/2020   Lab Results  Component Value Date   MICROALBUR 100 11/21/2014   LDLCALC 59 05/01/2021    CREATININE 1.29 (H) 05/01/2021  Assessed patient's understanding of A1c goal: <7% Reviewed medications with patient and discussed importance of medication adherence        Reviewed prescribed diet with patient ADA Carb modified diet Counseled on importance of regular laboratory monitoring as prescribed        Encouraged patient to continue checking and recording blood sugar daily and as needed       call provider for findings outside established parameters       Discussed home blood sugar monitoring and average blood sugar readings Discussed the impact that stress and other medical conditions can have on blood sugar Assessed social determinant of health barriers   Hypertension: (Status: Condition stable. Not addressed this visit.) Last practice recorded BP readings:  BP Readings from Last 3 Encounters:  05/01/21 124/70  04/29/21 118/89  04/15/21 138/73  Reviewed medications with patient and discussed importance of medication adherence Encouraged patient to check and record blood pressure at least 3 times per week and to call PCP with any readings outside of recommended range Reinforced low sodium/DASH diet Discussed plans with patient for ongoing care management follow up and provided patient with direct contact information for care management team   Oral Health:  (Status: Goal on track: NO.) Reviewed and discussed medications Previously placed referral to [place] who contacted the patient and provided him with information on the [facility]  [facility].  Provided with contact number again and encouraged to reach out for an appointment [phone] Reviewed and discussed recent oncology note and plan of care.  Oncologist would like to start monthly injections of denosumab but patient needs to have dental work completed prior to starting. Explained this to patient.  Provided with RN Care Manager contact number and encouraged to reach out as needed   Oncology:   (Status: Goal on track: YES.) Reviewed and discussed recent oncology visits  Verified available transportation to appointments and treatments. Has consistent/reliable transportation: Yes Assessed support system. Has consistent/reliable family or other support: Yes Nutrition assessment performed. Appetite has increased with Marinol and he has gained 3lbs between oncology visits  Reviewed upcoming appt with Dr Delton Coombes with the [facility] Discussed oncologists recommendation to start denosumab and that his dental work needs to be done prior to starting this. He had previously decided to wait until the first of the year to have dental work due to better insurance coverage at that time. Will need to consider having it done sooner.  Provided with [facility] telephone number again Reviewed and discussed medications and importance of compliance Discussed overall feeling of wellbeing. Reports being a little tired but otherwise feels well. Encouraged to reach out to [facility] as needed  Provided with verbal information regarding [place] with [facility] front office staff to print and mail application to patient   SDOH Barriers (Status: Goal Met. Provided with Needed Resource Information)  Patient interviewed and appropriate assessments performed Referred patient to community resources care guide team for assistance with hazards at home: leaking roof Advised patient that he should receive a telephone call from the [place] team    Patient Goals/Self-Care Activities: Patient will self administer medications as prescribed Patient will attend church or other social activities Patient will continue to perform ADL's independently Patient will continue to perform IADL's independently Patient will call provider office for new concerns or questions Patient will call [place] as needed [phone] Patient will keep all medical  appointments Patient will schedule dental care Patient will talk with [facility] [phone] regarding home repairs Patient will check and record blood pressure 3 times per week and will call PCP with any readings outside of recommended range Patient will check and record blood sugar daily and will call PCP with any readings outside of recommended range Patient will complete and return [place] application  Plan:Telephone follow up appointment with care management team member scheduled for:  08/20/21 with [facility] The patient has been provided with contact information for the care management team and has been advised to call with any health related questions or concerns.   Chong Sicilian, BSN, RN-BC Embedded Chronic Care Manager [facility] [place] Management Direct Dial: [phone]

## 2021-07-29 ENCOUNTER — Other Ambulatory Visit: Payer: Self-pay

## 2021-07-29 ENCOUNTER — Inpatient Hospital Stay (HOSPITAL_COMMUNITY): Payer: Medicare HMO

## 2021-07-29 ENCOUNTER — Inpatient Hospital Stay (HOSPITAL_BASED_OUTPATIENT_CLINIC_OR_DEPARTMENT_OTHER): Payer: Medicare HMO | Admitting: Hematology

## 2021-07-29 ENCOUNTER — Other Ambulatory Visit (HOSPITAL_COMMUNITY): Payer: Self-pay

## 2021-07-29 VITALS — BP 104/56 | HR 71 | Temp 98.8°F | Resp 17 | Ht 66.0 in | Wt 202.1 lb

## 2021-07-29 DIAGNOSIS — C775 Secondary and unspecified malignant neoplasm of intrapelvic lymph nodes: Secondary | ICD-10-CM | POA: Diagnosis not present

## 2021-07-29 DIAGNOSIS — Z7982 Long term (current) use of aspirin: Secondary | ICD-10-CM | POA: Diagnosis not present

## 2021-07-29 DIAGNOSIS — R69 Illness, unspecified: Secondary | ICD-10-CM | POA: Diagnosis not present

## 2021-07-29 DIAGNOSIS — C61 Malignant neoplasm of prostate: Secondary | ICD-10-CM

## 2021-07-29 DIAGNOSIS — Z79899 Other long term (current) drug therapy: Secondary | ICD-10-CM | POA: Diagnosis not present

## 2021-07-29 DIAGNOSIS — C7951 Secondary malignant neoplasm of bone: Secondary | ICD-10-CM | POA: Diagnosis not present

## 2021-07-29 DIAGNOSIS — D649 Anemia, unspecified: Secondary | ICD-10-CM | POA: Diagnosis not present

## 2021-07-29 DIAGNOSIS — E119 Type 2 diabetes mellitus without complications: Secondary | ICD-10-CM | POA: Diagnosis not present

## 2021-07-29 DIAGNOSIS — Z7984 Long term (current) use of oral hypoglycemic drugs: Secondary | ICD-10-CM | POA: Diagnosis not present

## 2021-07-29 DIAGNOSIS — I10 Essential (primary) hypertension: Secondary | ICD-10-CM | POA: Diagnosis not present

## 2021-07-29 LAB — CBC WITH DIFFERENTIAL/PLATELET
Abs Immature Granulocytes: 0.02 10*3/uL (ref 0.00–0.07)
Basophils Absolute: 0.1 10*3/uL (ref 0.0–0.1)
Basophils Relative: 1 %
Eosinophils Absolute: 0.3 10*3/uL (ref 0.0–0.5)
Eosinophils Relative: 4 %
HCT: 34.4 % — ABNORMAL LOW (ref 39.0–52.0)
Hemoglobin: 11.4 g/dL — ABNORMAL LOW (ref 13.0–17.0)
Immature Granulocytes: 0 %
Lymphocytes Relative: 38 %
Lymphs Abs: 2.6 10*3/uL (ref 0.7–4.0)
MCH: 31.8 pg (ref 26.0–34.0)
MCHC: 33.1 g/dL (ref 30.0–36.0)
MCV: 96.1 fL (ref 80.0–100.0)
Monocytes Absolute: 0.5 10*3/uL (ref 0.1–1.0)
Monocytes Relative: 7 %
Neutro Abs: 3.4 10*3/uL (ref 1.7–7.7)
Neutrophils Relative %: 50 %
Platelets: 187 10*3/uL (ref 150–400)
RBC: 3.58 MIL/uL — ABNORMAL LOW (ref 4.22–5.81)
RDW: 13.7 % (ref 11.5–15.5)
WBC: 6.8 10*3/uL (ref 4.0–10.5)
nRBC: 0 % (ref 0.0–0.2)

## 2021-07-29 LAB — COMPREHENSIVE METABOLIC PANEL
ALT: 20 U/L (ref 0–44)
AST: 29 U/L (ref 15–41)
Albumin: 3.5 g/dL (ref 3.5–5.0)
Alkaline Phosphatase: 79 U/L (ref 38–126)
Anion gap: 5 (ref 5–15)
BUN: 21 mg/dL (ref 8–23)
CO2: 28 mmol/L (ref 22–32)
Calcium: 8.6 mg/dL — ABNORMAL LOW (ref 8.9–10.3)
Chloride: 103 mmol/L (ref 98–111)
Creatinine, Ser: 1.29 mg/dL — ABNORMAL HIGH (ref 0.61–1.24)
GFR, Estimated: 58 mL/min — ABNORMAL LOW (ref >60)
Glucose, Bld: 119 mg/dL — ABNORMAL HIGH (ref 70–99)
Potassium: 3.4 mmol/L — ABNORMAL LOW (ref 3.5–5.1)
Sodium: 136 mmol/L (ref 135–145)
Total Bilirubin: 0.6 mg/dL (ref 0.3–1.2)
Total Protein: 6.8 g/dL (ref 6.5–8.1)

## 2021-07-29 NOTE — Progress Notes (Signed)
Patient is taking Zytiga as prescribed.  She has not missed any doses and reports no side effects at this time.

## 2021-07-29 NOTE — Progress Notes (Signed)
Andrew [name], [place] [id number]   CLINIC:  Medical Oncology/Hematology  PCP:  [name], MD [address] [place] [address] [phone]   REASON FOR VISIT:  Follow-up for metastatic castration refractory prostate cancer  PRIOR THERAPY: neoadjuvant hormonal therapy, EBRT followed by brachytherapy with iodine 125 seeds in 2010  NGS Results: not done  CURRENT THERAPY: Lupron injection (started 08/10/2019) and enzalutamide 160 mg daily (started 04/17/2020)  BRIEF ONCOLOGIC HISTORY:  Oncology History  Prostate cancer metastatic to intrapelvic lymph node ([employer])  04/07/2014 Initial Diagnosis   Prostate cancer metastatic to intrapelvic lymph node ([place])    Genetic Testing   [facility] Multi-Cancer Panel was Negative. Report date is 05/07/2021.  The Multi-Cancer + RNA Panel offered by [facility] includes sequencing and/or deletion/duplication analysis of the following 84 genes:  AIP*, ALK, APC*, ATM*, AXIN2*, BAP1*, BARD1*, BLM*, BMPR1A*, BRCA1*, BRCA2*, BRIP1*, CASR, CDC73*, CDH1*, CDK4, CDKN1B*, CDKN1C*, CDKN2A, CEBPA, CHEK2*, CTNNA1*, DICER1*, DIS3L2*, EGFR, EPCAM, FH*, FLCN*, GATA2*, GPC3, GREM1, HOXB13, HRAS, KIT, MAX*, MEN1*, MET, MITF, MLH1*, MSH2*, MSH3*, MSH6*, MUTYH*, NBN*, NF1*, NF2*, NTHL1*, PALB2*, PDGFRA, PHOX2B, PMS2*, POLD1*, POLE*, POT1*, PRKAR1A*, PTCH1*, PTEN*, RAD50*, RAD51C*, RAD51D*, RB1*, RECQL4, RET, RUNX1*, SDHA*, SDHAF2*, SDHB*, SDHC*, SDHD*, SMAD4*, SMARCA4*, SMARCB1*, SMARCE1*, STK11*, SUFU*, TERC, TERT, TMEM127*, Tp53*, TSC1*, TSC2*, VHL*, WRN*, and WT1.  RNA analysis is performed for * genes.     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, a 76 y.o. male, returns for routine follow-up of his metastatic castration  refractory prostate cancer. Domani was last seen on 07/04/2021.   Today he reports feeling good. He has been taking Zytiga for 01/16. He is taking prednisone and reports his appetite has increased. He denies nausea and reports occasional stable hot flashes. He denies severe fatigue. He has not yet seen the dentist.   REVIEW OF SYSTEMS:  Review of Systems  Constitutional:  Negative for appetite change and fatigue.  Gastrointestinal:  Negative for nausea.  Endocrine: Positive for hot flashes (stable).  All other systems reviewed and are negative.  PAST MEDICAL/SURGICAL HISTORY:  Past Medical History:  Diagnosis Date   Anemia    BPH (benign prostatic hyperplasia)    Sees Dr [name]   Cataract    Diabetes mellitus without complication ([place])    Hyperlipidemia    Hypertension    Low serum vitamin D    Prostate cancer ([place]) 2008   w/seed implantation and radiation   Past Surgical History:  Procedure Laterality Date   COLONOSCOPY  2015,2018   PROSTATE SURGERY  2008   seed implant    SOCIAL HISTORY:  Social History   Socioeconomic History   Marital status: Married    Spouse name: [name]   Number of children: 2   Years of education: 12   Highest education level: 12th grade  Occupational History   Occupation: [name]    Comment: Retired Geologist, engineering  Tobacco Use   Smoking status: Every Day    Packs/day: 1.00    Years: 55.00    Pack years: 55.00    Types: Cigarettes    Start date: 06/30/1965   Smokeless tobacco: Never   Tobacco comments:    Has tried nicotine replacement gum  Vaping Use   Vaping Use:  Never used  Substance and Sexual Activity   Alcohol use: No   Drug use: No   Sexual activity: Yes  Other Topics Concern   Not on file  Social History Narrative   Retired, lives at home with wife [name], two grown children, enjoys gardening     Social Determinants of Radio broadcast assistant Strain: Low Risk    Difficulty of Paying Living Expenses: Not hard at  all  Food Insecurity: No Food Insecurity   Worried About Charity fundraiser in the Last Year: Never true   Arboriculturist in the Last Year: Never true  Transportation Needs: No Transportation Needs   Lack of Transportation (Medical): No   Lack of Transportation (Non-Medical): No  Physical Activity: Inactive   Days of Exercise per Week: 0 days   Minutes of Exercise per Session: 0 min  Stress: No Stress Concern Present   Feeling of Stress : Not at all  Social Connections: Socially Integrated   Frequency of Communication with Friends and Family: More than three times a week   Frequency of Social Gatherings with Friends and Family: More than three times a week   Attends Religious Services: More than 4 times per year   Active Member of [employer] or Organizations: Yes   Attends Music therapist: More than 4 times per year   Marital Status: Married  Human resources officer Violence: Not At Risk   Fear of Current or Ex-Partner: No   Emotionally Abused: No   Physically Abused: No   Sexually Abused: No    FAMILY HISTORY:  Family History  Problem Relation Age of Onset   Diabetes Mother    Heart disease Mother    Kidney disease Mother        DIALYSIS   Emphysema Father    Deep vein thrombosis Sister    Kidney disease Sister    Lung cancer Sister    Colon polyps Brother    Hypertension Brother    Gout Brother    Colon cancer Neg Hx    Esophageal cancer Neg Hx    Rectal cancer Neg Hx    Prostate cancer Neg Hx     CURRENT MEDICATIONS:  Current Outpatient Medications  Medication Sig Dispense Refill   abiraterone acetate (ZYTIGA) 250 MG tablet Take 4 tablets (1,000 mg total) by mouth daily. Take on an empty stomach 1 hour before or 2 hours after a meal 120 tablet 2   alfuzosin (UROXATRAL) 10 MG 24 hr tablet Take 1 tablet (10 mg total) by mouth at bedtime. 30 tablet 11   aspirin 81 MG tablet Take 81 mg by mouth daily.     atorvastatin (LIPITOR) 40 MG tablet Take 1 tablet (40 mg  total) by mouth daily. 90 tablet 0   cholecalciferol (VITAMIN D) 1000 UNITS tablet Take 1,000 Units by mouth daily.      clotrimazole-betamethasone (LOTRISONE) cream Apply 1 application topically 2 (two) times daily. 30 g 0   dronabinol (MARINOL) 5 MG capsule Take 1 capsule (5 mg total) by mouth 2 (two) times daily before a meal. 60 capsule 3   glucose blood test strip 1 each by Other route as needed for other. Use as instructed to test blood sugar daily.  One Touch Verio Test Strips DX: E11.65 100 each 11   lisinopril-hydrochlorothiazide (ZESTORETIC) 20-25 MG tablet Take 1 tablet by mouth daily. 90 tablet 0   metFORMIN (GLUCOPHAGE) 1000 MG tablet Take 1 tablet (1,000 mg  total) by mouth 2 (two) times daily with a meal. 180 tablet 0   predniSONE (DELTASONE) 5 MG tablet Take 1 tablet (5 mg total) by mouth daily with breakfast. 30 tablet 6   No current facility-administered medications for this visit.    ALLERGIES:  No Known Allergies  PHYSICAL EXAM:  Performance status (ECOG): 1 - Symptomatic but completely ambulatory  Vitals:   07/29/21 1044  BP: (!) 104/56  Pulse: 71  Resp: 17  Temp: 98.8 F (37.1 C)  SpO2: 100%   Wt Readings from Last 3 Encounters:  07/29/21 202 lb 1.6 oz (91.7 kg)  07/04/21 199 lb 8 oz (90.5 kg)  06/06/21 196 lb 10.4 oz (89.2 kg)   Physical Exam Vitals reviewed.  Constitutional:      Appearance: Normal appearance. He is obese.  Cardiovascular:     Rate and Rhythm: Normal rate and regular rhythm.     Pulses: Normal pulses.     Heart sounds: Normal heart sounds.  Pulmonary:     Effort: Pulmonary effort is normal.     Breath sounds: Normal breath sounds.  Neurological:     General: No focal deficit present.     Mental Status: He is alert and oriented to person, place, and time.  Psychiatric:        Mood and Affect: Mood normal.        Behavior: Behavior normal.     LABORATORY DATA:  I have reviewed the labs as listed.  CBC Latest Ref Rng & Units  07/29/2021 06/06/2021 05/01/2021  WBC 4.0 - 10.5 K/uL 6.8 6.0 6.1  Hemoglobin 13.0 - 17.0 g/dL 11.4(L) 11.2(L) 11.3(L)  Hematocrit 39.0 - 52.0 % 34.4(L) 33.2(L) 33.3(L)  Platelets 150 - 400 K/uL 187 201 230   CMP Latest Ref Rng & Units 07/29/2021 06/06/2021 05/01/2021  Glucose 70 - 99 mg/dL 119(H) 132(H) 102(H)  BUN 8 - 23 mg/dL '21 18 16  ' Creatinine 0.61 - 1.24 mg/dL 1.29(H) 1.32(H) 1.29(H)  Sodium 135 - 145 mmol/L 136 138 139  Potassium 3.5 - 5.1 mmol/L 3.4(L) 3.7 3.9  Chloride 98 - 111 mmol/L 103 105 102  CO2 22 - 32 mmol/L '28 26 24  ' Calcium 8.9 - 10.3 mg/dL 8.6(L) 8.6(L) 9.2  Total Protein 6.5 - 8.1 g/dL 6.8 6.7 6.4  Total Bilirubin 0.3 - 1.2 mg/dL 0.6 0.5 0.4  Alkaline Phos 38 - 126 U/L 79 94 99  AST 15 - 41 U/L '29 22 24  ' ALT 0 - 44 U/L '20 16 11    ' DIAGNOSTIC IMAGING:  I have independently reviewed the scans and discussed with the patient. No results found.   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  CTAP on 08/26/2017 shows enlarged left pelvic sidewall lymph node 2.2 cm. - Lupron initiated on 08/10/2019. - CTAP on 03/09/2020 with left internal/external iliac lymph node measuring 2.1 x 2.8 cm (2.1 x 2.2 cm), craniocaudal 4.1 cm, previously 3.4 cm. - Enzalutamide to 160 mg daily started on 04/17/2020.  Discontinued end of October 2022. - PSA on 02/20/2021 (5.2), 07/11/2020 (2.0), 02/08/2020 (3.1) - CTAP on 03/07/2021 with left iliac bifurcation lymph node measuring 3.5 x 2.6 cm (previously 2.8 x 2.0 cm) - Bone scan on 03/07/2021 negative. -  Left pelvic lymph node biopsy on 04/08/2021, metastatic prostate cancer. - We have reviewed PSMA PET scan results which showed enlarged left iliac lymph node 33 mm with  SUV 285.  1 mm lymph node and 4 mm node between IVC and aorta.  Activity in the right acetabulum, left iliac bone, left transverse process of T11 vertebral body, spine of the right scapula. - Abiraterone and prednisone prescribed on 07/04/2021. - Guardant 360 did not show any targetable mutations.  Did not show MSI high. - NGS testing could not be done because of less than 100 tumor cells. - Abiraterone and prednisone started on 07/15/2021.  2.  Social/family history: - He lives at home with his wife.  He is able to do household activities including working in the garden and Film/video editor.  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 - He is current active smoker, 1 pack/day for 55 years. - Sister died of lung cancer.   PLAN:  Metastatic castration refractory prostate cancer to the left iliac lymph node: - Abiraterone 1000 mg daily and prednisone 5 mg daily started on 07/15/2021. - He had mild hot flashes.  Did not report any severe fatigue.  Energy is 75%. - Reviewed labs today which showed mild hypokalemia of potassium 3.4.  LFTs are normal.  CBC was grossly normal. - No dose modification required.  Continue current dose.  RTC 4 weeks with repeat labs.  He has mild normocytic anemia.  We will also check ferritin and iron panel.   2.  Smoking history: - CT part of the PET scan on 04/24/2021 did not show any significant changes.  3.  Loss of appetite: - Continue Marinol 5 mg twice daily. - His weight is continuing to improve.  4.  Bone metastasis: - We have discussed denosumab to decrease skeletal related events.  However he will need dental clearance prior to start of therapy.   Orders placed this encounter:  No orders of the defined types were placed in this encounter.    Derek Jack, MD [place] [phone]   I, Thana Ates, am acting as a scribe for Dr. Derek Jack.  I, Derek Jack MD, have reviewed the  above documentation for accuracy and completeness, and I agree with the above.

## 2021-07-29 NOTE — Patient Instructions (Signed)
[place] at [facility] Discharge Instructions   You were seen and examined today by Dr. Delton Coombes.  He reviewed your lab work which is normal/stable.  Try to increase your fruit intake (bananas, oranges, etc) to help your potassium level.  It was slightly low today.  Return as scheduled for lab work and office visit.    Thank you for choosing [place] at [facility] to provide your oncology and hematology care.  To afford each patient quality time with our provider, please arrive at least 15 minutes before your scheduled appointment time.   If you have a lab appointment with the [facility] please come in thru the Main Entrance and check in at the main information desk.  You need to re-schedule your appointment should you arrive 10 or more minutes late.  We strive to give you quality time with our providers, and arriving late affects you and other patients whose appointments are after yours.  Also, if you no show three or more times for appointments you may be dismissed from the clinic at the providers discretion.     Again, thank you for choosing [facility].  Our hope is that these requests will decrease the amount of time that you wait before being seen by our physicians.       _____________________________________________________________  Should you have questions after your visit to [facility], please contact our office at [phone] and follow the prompts.  Our office hours are 8:00 a.m. and 4:30 p.m. Monday - Friday.  Please note that voicemails left after 4:00 p.m. may not be returned until the following business day.  We are closed weekends and major holidays.  You do have access to a nurse 24-7, just call the main number to the clinic [phone] and do not press any options, hold on the line and a nurse will answer the phone.    For prescription refill requests, have your pharmacy contact our office  and allow 72 hours.    Due to Covid, you will need to wear a mask upon entering the hospital. If you do not have a mask, a mask will be given to you at the Main Entrance upon arrival. For doctor visits, patients may have 1 support person age 20 or older with them. For treatment visits, patients can not have anyone with them due to social distancing guidelines and our immunocompromised population.

## 2021-07-30 ENCOUNTER — Other Ambulatory Visit (HOSPITAL_COMMUNITY): Payer: Self-pay | Admitting: *Deleted

## 2021-07-30 DIAGNOSIS — C61 Malignant neoplasm of prostate: Secondary | ICD-10-CM

## 2021-07-30 DIAGNOSIS — C775 Secondary and unspecified malignant neoplasm of intrapelvic lymph nodes: Secondary | ICD-10-CM

## 2021-07-30 DIAGNOSIS — I10 Essential (primary) hypertension: Secondary | ICD-10-CM | POA: Diagnosis not present

## 2021-07-30 DIAGNOSIS — E1165 Type 2 diabetes mellitus with hyperglycemia: Secondary | ICD-10-CM

## 2021-07-31 ENCOUNTER — Other Ambulatory Visit (HOSPITAL_COMMUNITY): Payer: Self-pay

## 2021-08-11 ENCOUNTER — Other Ambulatory Visit: Payer: Self-pay | Admitting: Urology

## 2021-08-11 DIAGNOSIS — N138 Other obstructive and reflux uropathy: Secondary | ICD-10-CM

## 2021-08-14 ENCOUNTER — Other Ambulatory Visit: Payer: Medicare HMO

## 2021-08-14 ENCOUNTER — Other Ambulatory Visit: Payer: Self-pay

## 2021-08-14 ENCOUNTER — Telehealth: Payer: Self-pay

## 2021-08-14 DIAGNOSIS — N138 Other obstructive and reflux uropathy: Secondary | ICD-10-CM

## 2021-08-14 DIAGNOSIS — C61 Malignant neoplasm of prostate: Secondary | ICD-10-CM

## 2021-08-14 DIAGNOSIS — N401 Enlarged prostate with lower urinary tract symptoms: Secondary | ICD-10-CM

## 2021-08-14 DIAGNOSIS — C775 Secondary and unspecified malignant neoplasm of intrapelvic lymph nodes: Secondary | ICD-10-CM

## 2021-08-14 MED ORDER — ALFUZOSIN HCL ER 10 MG PO TB24: 10.0000 mg | ORAL_TABLET | Freq: Every day | ORAL | 3 refills | Status: DC

## 2021-08-14 NOTE — Telephone Encounter (Signed)
Patient called and advised they needed a refill on medication.  Medication: alfuzosin (UROXATRAL) 10 MG 24 hr tablet  Pharmacy: [facility] [place], [place] [address] [phone]

## 2021-08-14 NOTE — Telephone Encounter (Signed)
Rx refilled sent to [facility]

## 2021-08-15 LAB — PSA: Prostate Specific Ag, Serum: 4.8 ng/mL — ABNORMAL HIGH (ref 0.0–4.0)

## 2021-08-20 ENCOUNTER — Telehealth: Payer: Medicare HMO | Admitting: *Deleted

## 2021-08-21 ENCOUNTER — Other Ambulatory Visit: Payer: Self-pay

## 2021-08-21 ENCOUNTER — Encounter: Payer: Self-pay | Admitting: Urology

## 2021-08-21 ENCOUNTER — Ambulatory Visit (INDEPENDENT_AMBULATORY_CARE_PROVIDER_SITE_OTHER): Payer: Medicare HMO | Admitting: Urology

## 2021-08-21 VITALS — BP 143/79 | HR 79 | Ht 68.0 in | Wt 203.0 lb

## 2021-08-21 DIAGNOSIS — N138 Other obstructive and reflux uropathy: Secondary | ICD-10-CM

## 2021-08-21 DIAGNOSIS — C775 Secondary and unspecified malignant neoplasm of intrapelvic lymph nodes: Secondary | ICD-10-CM | POA: Diagnosis not present

## 2021-08-21 DIAGNOSIS — R351 Nocturia: Secondary | ICD-10-CM | POA: Diagnosis not present

## 2021-08-21 DIAGNOSIS — C61 Malignant neoplasm of prostate: Secondary | ICD-10-CM | POA: Diagnosis not present

## 2021-08-21 DIAGNOSIS — N401 Enlarged prostate with lower urinary tract symptoms: Secondary | ICD-10-CM | POA: Diagnosis not present

## 2021-08-21 LAB — MICROSCOPIC EXAMINATION
Bacteria, UA: NONE SEEN
Epithelial Cells (non renal): NONE SEEN /hpf (ref 0–10)
Renal Epithel, UA: NONE SEEN /hpf
WBC, UA: NONE SEEN /hpf (ref 0–5)

## 2021-08-21 LAB — URINALYSIS, ROUTINE W REFLEX MICROSCOPIC
Bilirubin, UA: NEGATIVE
Glucose, UA: NEGATIVE
Ketones, UA: NEGATIVE
Leukocytes,UA: NEGATIVE
Nitrite, UA: NEGATIVE
Protein,UA: NEGATIVE
Specific Gravity, UA: 1.02 (ref 1.005–1.030)
Urobilinogen, Ur: 4 mg/dL — ABNORMAL HIGH (ref 0.2–1.0)
pH, UA: 7 (ref 5.0–7.5)

## 2021-08-21 MED ORDER — ALFUZOSIN HCL ER 10 MG PO TB24: 10.0000 mg | ORAL_TABLET | Freq: Every day | ORAL | 3 refills | Status: DC

## 2021-08-21 NOTE — Patient Instructions (Signed)

## 2021-08-21 NOTE — Progress Notes (Signed)
08/21/2021 11:27 AM   Andrew [name] Oct 19, 1945 [id number]  Referring provider: [name], MD [place],  [place] [address]  Followup prostate cancer and ncoturia   HPI: Andrew [name] is a 76yo here for followup for BPH with nocturia and prostate cancer. PSA decreased to 4.8 from 5.2 on xytiga. He has mild hot flashes. No new bone pain. IPSS 10 QOl 1 on uroaxtral 10mg  daily. Nocturia 1-2x. Urine stream strong. No other complaints today   PMH: Past Medical History:  Diagnosis Date   Anemia    BPH (benign prostatic hyperplasia)    Sees Dr Andrew [name]   Cataract    Diabetes mellitus without complication ([place])    Hyperlipidemia    Hypertension    Low serum vitamin D    Prostate cancer ([place]) 2008   w/seed implantation and radiation    Surgical History: Past Surgical History:  Procedure Laterality Date   COLONOSCOPY  2015,2018   PROSTATE SURGERY  2008   seed implant    Home Medications:  Allergies as of 08/21/2021   No Known Allergies      Medication List        Accurate as of August 21, 2021 11:27 AM. If you have any questions, ask your nurse or doctor.          abiraterone acetate 250 MG tablet Commonly known as: ZYTIGA Take 4 tablets (1,000 mg total) by mouth daily. Take on an empty stomach 1 hour before or 2 hours after a meal   alfuzosin 10 MG 24 hr tablet Commonly known as: UROXATRAL Take 1 tablet (10 mg total) by mouth at bedtime.   aspirin 81 MG tablet Take 81 mg by mouth daily.   atorvastatin 40 MG tablet Commonly known as: LIPITOR Take 1 tablet (40 mg total) by mouth daily.   cholecalciferol 1000 units tablet Commonly known as: VITAMIN D Take 1,000 Units by mouth daily.   clotrimazole-betamethasone cream Commonly known as: Lotrisone Apply 1 application topically 2 (two) times daily.   dronabinol 5 MG capsule Commonly known as: MARINOL Take 1 capsule (5 mg total) by mouth 2 (two) times daily before a meal.   glucose blood test  strip 1 each by Other route as needed for other. Use as instructed to test blood sugar daily.  One Touch Verio Test Strips DX: E11.65   lisinopril-hydrochlorothiazide 20-25 MG tablet Commonly known as: ZESTORETIC Take 1 tablet by mouth daily.   metFORMIN 1000 MG tablet Commonly known as: GLUCOPHAGE Take 1 tablet (1,000 mg total) by mouth 2 (two) times daily with a meal.   predniSONE 5 MG tablet Commonly known as: DELTASONE Take 1 tablet (5 mg total) by mouth daily with breakfast.        Allergies: No Known Allergies  Family History: Family History  Problem Relation Age of Onset   Diabetes Mother    Heart disease Mother    Kidney disease Mother        DIALYSIS   Emphysema Father    Deep vein thrombosis Sister    Kidney disease Sister    Lung cancer Sister    Colon polyps Brother    Hypertension Brother    Gout Brother    Colon cancer Neg Hx    Esophageal cancer Neg Hx    Rectal cancer Neg Hx    Prostate cancer Neg Hx     Social History:  reports that he has been smoking cigarettes. He started smoking about 56 years  ago. He has a 55.00 pack-year smoking history. He has never used smokeless tobacco. He reports that he does not drink alcohol and does not use drugs.  ROS: All other review of systems were reviewed and are negative except what is noted above in HPI  Physical Exam: BP (!) 143/79    Pulse 79    Ht 5\' 8"  (1.727 m)    Wt 203 lb (92.1 kg)    BMI 30.87 kg/m   Constitutional:  Alert and oriented, No acute distress. HEENT: [place]eb AT, moist mucus membranes.  Trachea midline, no masses. Cardiovascular: No clubbing, cyanosis, or edema. Respiratory: Normal respiratory effort, no increased work of breathing. GI: Abdomen is soft, nontender, nondistended, no abdominal masses GU: No CVA tenderness.  Lymph: No cervical or inguinal lymphadenopathy. Skin: No rashes, bruises or suspicious lesions. Neurologic: Grossly intact, no focal deficits, moving all 4  extremities. Psychiatric: Normal mood and affect.  Laboratory Data: Lab Results  Component Value Date   WBC 6.8 07/29/2021   HGB 11.4 (L) 07/29/2021   HCT 34.4 (L) 07/29/2021   MCV 96.1 07/29/2021   PLT 187 07/29/2021    Lab Results  Component Value Date   CREATININE 1.29 (H) 07/29/2021    Lab Results  Component Value Date   PSA 1.6 08/10/2019    Lab Results  Component Value Date   TESTOSTERONE <3 (L) 04/02/2021    Lab Results  Component Value Date   HGBA1C 5.6 05/01/2021    Urinalysis    Component Value Date/Time   COLORURINE YELLOW 07/02/2017 0410   APPEARANCEUR Clear 03/27/2021 1551   LABSPEC 1.018 07/02/2017 0410   PHURINE 7.0 07/02/2017 0410   GLUCOSEU Negative 03/27/2021 1551   HGBUR NEGATIVE 07/02/2017 0410   BILIRUBINUR Negative 03/27/2021 1551   KETONESUR NEGATIVE 07/02/2017 0410   PROTEINUR Negative 03/27/2021 1551   PROTEINUR NEGATIVE 07/02/2017 0410   UROBILINOGEN negative 07/19/2014 1002   NITRITE Negative 03/27/2021 1551   NITRITE NEGATIVE 07/02/2017 0410   LEUKOCYTESUR Negative 03/27/2021 1551    Lab Results  Component Value Date   LABMICR Comment 03/27/2021   WBCUA None seen 01/10/2020   RBCUA None seen 08/11/2018   LABEPIT None seen 01/10/2020   MUCUS neg 06/21/2013   BACTERIA None seen 01/10/2020    Pertinent Imaging:  No results found for this or any previous visit.  No results found for this or any previous visit.  No results found for this or any previous visit.  No results found for this or any previous visit.  No results found for this or any previous visit.  No results found for this or any previous visit.  No results found for this or any previous visit.  No results found for this or any previous visit.   Assessment & Plan:    1. Nocturia -uroxatral 10mg   - Urinalysis, Routine w reflex microscopic  2. Metastatic prostate cancer -Schedule for eligard 45mg  in 1 week -RTC 6 months with PSA   No  follow-ups on file.  Andrew Bang, MD  [facility] [place]

## 2021-08-22 ENCOUNTER — Other Ambulatory Visit (HOSPITAL_COMMUNITY): Payer: Medicare HMO

## 2021-08-22 DIAGNOSIS — G8929 Other chronic pain: Secondary | ICD-10-CM | POA: Diagnosis not present

## 2021-08-22 DIAGNOSIS — E669 Obesity, unspecified: Secondary | ICD-10-CM | POA: Diagnosis not present

## 2021-08-22 DIAGNOSIS — Z6831 Body mass index (BMI) 31.0-31.9, adult: Secondary | ICD-10-CM | POA: Diagnosis not present

## 2021-08-22 DIAGNOSIS — Z720 Tobacco use: Secondary | ICD-10-CM | POA: Diagnosis not present

## 2021-08-22 DIAGNOSIS — Z7984 Long term (current) use of oral hypoglycemic drugs: Secondary | ICD-10-CM | POA: Diagnosis not present

## 2021-08-22 DIAGNOSIS — R69 Illness, unspecified: Secondary | ICD-10-CM | POA: Diagnosis not present

## 2021-08-22 DIAGNOSIS — N529 Male erectile dysfunction, unspecified: Secondary | ICD-10-CM | POA: Diagnosis not present

## 2021-08-22 DIAGNOSIS — Z008 Encounter for other general examination: Secondary | ICD-10-CM | POA: Diagnosis not present

## 2021-08-22 DIAGNOSIS — E119 Type 2 diabetes mellitus without complications: Secondary | ICD-10-CM | POA: Diagnosis not present

## 2021-08-22 DIAGNOSIS — Z809 Family history of malignant neoplasm, unspecified: Secondary | ICD-10-CM | POA: Diagnosis not present

## 2021-08-22 DIAGNOSIS — N400 Enlarged prostate without lower urinary tract symptoms: Secondary | ICD-10-CM | POA: Diagnosis not present

## 2021-08-22 DIAGNOSIS — I10 Essential (primary) hypertension: Secondary | ICD-10-CM | POA: Diagnosis not present

## 2021-08-22 DIAGNOSIS — E785 Hyperlipidemia, unspecified: Secondary | ICD-10-CM | POA: Diagnosis not present

## 2021-08-23 ENCOUNTER — Inpatient Hospital Stay (HOSPITAL_COMMUNITY): Payer: Medicare HMO | Attending: Hematology

## 2021-08-23 ENCOUNTER — Other Ambulatory Visit (HOSPITAL_COMMUNITY): Payer: Self-pay

## 2021-08-23 DIAGNOSIS — F17210 Nicotine dependence, cigarettes, uncomplicated: Secondary | ICD-10-CM | POA: Diagnosis not present

## 2021-08-23 DIAGNOSIS — K5900 Constipation, unspecified: Secondary | ICD-10-CM | POA: Insufficient documentation

## 2021-08-23 DIAGNOSIS — C61 Malignant neoplasm of prostate: Secondary | ICD-10-CM | POA: Insufficient documentation

## 2021-08-23 DIAGNOSIS — Z7952 Long term (current) use of systemic steroids: Secondary | ICD-10-CM | POA: Diagnosis not present

## 2021-08-23 DIAGNOSIS — Z801 Family history of malignant neoplasm of trachea, bronchus and lung: Secondary | ICD-10-CM | POA: Diagnosis not present

## 2021-08-23 DIAGNOSIS — Z7984 Long term (current) use of oral hypoglycemic drugs: Secondary | ICD-10-CM | POA: Diagnosis not present

## 2021-08-23 DIAGNOSIS — Z7982 Long term (current) use of aspirin: Secondary | ICD-10-CM | POA: Insufficient documentation

## 2021-08-23 DIAGNOSIS — Z79899 Other long term (current) drug therapy: Secondary | ICD-10-CM | POA: Diagnosis not present

## 2021-08-23 DIAGNOSIS — E876 Hypokalemia: Secondary | ICD-10-CM | POA: Insufficient documentation

## 2021-08-23 DIAGNOSIS — C7951 Secondary malignant neoplasm of bone: Secondary | ICD-10-CM | POA: Insufficient documentation

## 2021-08-23 DIAGNOSIS — C775 Secondary and unspecified malignant neoplasm of intrapelvic lymph nodes: Secondary | ICD-10-CM | POA: Insufficient documentation

## 2021-08-23 DIAGNOSIS — R232 Flushing: Secondary | ICD-10-CM | POA: Diagnosis not present

## 2021-08-23 DIAGNOSIS — R69 Illness, unspecified: Secondary | ICD-10-CM | POA: Diagnosis not present

## 2021-08-23 LAB — CBC WITH DIFFERENTIAL/PLATELET
Abs Immature Granulocytes: 0.01 10*3/uL (ref 0.00–0.07)
Basophils Absolute: 0.1 10*3/uL (ref 0.0–0.1)
Basophils Relative: 1 %
Eosinophils Absolute: 0.2 10*3/uL (ref 0.0–0.5)
Eosinophils Relative: 4 %
HCT: 31.9 % — ABNORMAL LOW (ref 39.0–52.0)
Hemoglobin: 10.8 g/dL — ABNORMAL LOW (ref 13.0–17.0)
Immature Granulocytes: 0 %
Lymphocytes Relative: 37 %
Lymphs Abs: 2.4 10*3/uL (ref 0.7–4.0)
MCH: 32.2 pg (ref 26.0–34.0)
MCHC: 33.9 g/dL (ref 30.0–36.0)
MCV: 95.2 fL (ref 80.0–100.0)
Monocytes Absolute: 0.5 10*3/uL (ref 0.1–1.0)
Monocytes Relative: 9 %
Neutro Abs: 3.1 10*3/uL (ref 1.7–7.7)
Neutrophils Relative %: 49 %
Platelets: 178 10*3/uL (ref 150–400)
RBC: 3.35 MIL/uL — ABNORMAL LOW (ref 4.22–5.81)
RDW: 13.6 % (ref 11.5–15.5)
WBC: 6.3 10*3/uL (ref 4.0–10.5)
nRBC: 0 % (ref 0.0–0.2)

## 2021-08-23 LAB — COMPREHENSIVE METABOLIC PANEL
ALT: 18 U/L (ref 0–44)
AST: 24 U/L (ref 15–41)
Albumin: 3.2 g/dL — ABNORMAL LOW (ref 3.5–5.0)
Alkaline Phosphatase: 73 U/L (ref 38–126)
Anion gap: 8 (ref 5–15)
BUN: 16 mg/dL (ref 8–23)
CO2: 27 mmol/L (ref 22–32)
Calcium: 8.5 mg/dL — ABNORMAL LOW (ref 8.9–10.3)
Chloride: 104 mmol/L (ref 98–111)
Creatinine, Ser: 1.29 mg/dL — ABNORMAL HIGH (ref 0.61–1.24)
GFR, Estimated: 58 mL/min — ABNORMAL LOW (ref >60)
Glucose, Bld: 121 mg/dL — ABNORMAL HIGH (ref 70–99)
Potassium: 2.9 mmol/L — ABNORMAL LOW (ref 3.5–5.1)
Sodium: 139 mmol/L (ref 135–145)
Total Bilirubin: 1 mg/dL (ref 0.3–1.2)
Total Protein: 6.7 g/dL (ref 6.5–8.1)

## 2021-08-23 LAB — MAGNESIUM: Magnesium: 1.7 mg/dL (ref 1.7–2.4)

## 2021-08-26 ENCOUNTER — Inpatient Hospital Stay (HOSPITAL_BASED_OUTPATIENT_CLINIC_OR_DEPARTMENT_OTHER): Payer: Medicare HMO | Admitting: Hematology

## 2021-08-26 ENCOUNTER — Other Ambulatory Visit: Payer: Self-pay

## 2021-08-26 ENCOUNTER — Encounter (HOSPITAL_COMMUNITY): Payer: Self-pay | Admitting: Hematology

## 2021-08-26 VITALS — BP 155/83 | HR 83 | Temp 97.3°F | Resp 17 | Ht 68.0 in | Wt 205.8 lb

## 2021-08-26 DIAGNOSIS — E876 Hypokalemia: Secondary | ICD-10-CM | POA: Diagnosis not present

## 2021-08-26 DIAGNOSIS — Z7982 Long term (current) use of aspirin: Secondary | ICD-10-CM | POA: Diagnosis not present

## 2021-08-26 DIAGNOSIS — Z7952 Long term (current) use of systemic steroids: Secondary | ICD-10-CM | POA: Diagnosis not present

## 2021-08-26 DIAGNOSIS — R69 Illness, unspecified: Secondary | ICD-10-CM | POA: Diagnosis not present

## 2021-08-26 DIAGNOSIS — C775 Secondary and unspecified malignant neoplasm of intrapelvic lymph nodes: Secondary | ICD-10-CM | POA: Diagnosis not present

## 2021-08-26 DIAGNOSIS — C7951 Secondary malignant neoplasm of bone: Secondary | ICD-10-CM | POA: Diagnosis not present

## 2021-08-26 DIAGNOSIS — C61 Malignant neoplasm of prostate: Secondary | ICD-10-CM

## 2021-08-26 DIAGNOSIS — K5900 Constipation, unspecified: Secondary | ICD-10-CM | POA: Diagnosis not present

## 2021-08-26 DIAGNOSIS — Z7984 Long term (current) use of oral hypoglycemic drugs: Secondary | ICD-10-CM | POA: Diagnosis not present

## 2021-08-26 DIAGNOSIS — R232 Flushing: Secondary | ICD-10-CM | POA: Diagnosis not present

## 2021-08-26 MED ORDER — PREDNISONE 5 MG PO TABS: 5.0000 mg | ORAL_TABLET | Freq: Two times a day (BID) | ORAL | 6 refills | Status: DC

## 2021-08-26 MED ORDER — POTASSIUM CHLORIDE CRYS ER 20 MEQ PO TBCR: 20.0000 meq | EXTENDED_RELEASE_TABLET | Freq: Every day | ORAL | 6 refills | Status: DC

## 2021-08-26 MED ORDER — POTASSIUM CHLORIDE CRYS ER 20 MEQ PO TBCR
40.0000 meq | EXTENDED_RELEASE_TABLET | Freq: Once | ORAL | Status: AC
  Administered 2021-08-26: 40 meq via ORAL
  Filled 2021-08-26: qty 2

## 2021-08-26 NOTE — Progress Notes (Signed)
Future orders placed for labs and potassium 40 meq today per Dr. Tomie China orders.

## 2021-08-26 NOTE — Patient Instructions (Signed)
[name] at [facility] Discharge Instructions  You were seen and examined today by Dr. Delton Coombes. He reviewed your most recent labs and your potassium is low and it is showing that you have some anemia. Start taking the prednisone twice daily. Dr. Delton Coombes sent in potassium to the pharmacy for you to start taking. Please keep follow up appointment as scheduled in 4 weeks.   Thank you for choosing [place] at [facility] to provide your oncology and hematology care.  To afford each patient quality time with our provider, please arrive at least 15 minutes before your scheduled appointment time.   If you have a lab appointment with the [facility] please come in thru the Main Entrance and check in at the main information desk.  You need to re-schedule your appointment should you arrive 10 or more minutes late.  We strive to give you quality time with our providers, and arriving late affects you and other patients whose appointments are after yours.  Also, if you no show three or more times for appointments you may be dismissed from the clinic at the providers discretion.     Again, thank you for choosing [facility].  Our hope is that these requests will decrease the amount of time that you wait before being seen by our physicians.       _____________________________________________________________  Should you have questions after your visit to [facility], please contact our office at [phone] and follow the prompts.  Our office hours are 8:00 a.m. and 4:30 p.m. Monday - Friday.  Please note that voicemails left after 4:00 p.m. may not be returned until the following business day.  We are closed weekends and major holidays.  You do have access to a nurse 24-7, just call the main number to the clinic [phone] and do not press any options, hold on the line and a nurse will answer the phone.    For prescription  refill requests, have your pharmacy contact our office and allow 72 hours.    Due to Covid, you will need to wear a mask upon entering the hospital. If you do not have a mask, a mask will be given to you at the Main Entrance upon arrival. For doctor visits, patients may have 1 support person age 102 or older with them. For treatment visits, patients can not have anyone with them due to social distancing guidelines and our immunocompromised population.

## 2021-08-26 NOTE — Progress Notes (Signed)
Andrew [name], [place] [id number]   CLINIC:  Medical Oncology/Hematology  PCP:  Andrew Fraise, MD [address] [place] [address] [phone]   REASON FOR VISIT:  Follow-up for metastatic castration refractory prostate cancer  PRIOR THERAPY: neoadjuvant hormonal therapy, EBRT followed by brachytherapy with iodine 125 seeds in 2010  NGS Results: not done  CURRENT THERAPY: Lupron injection (started 08/10/2019) and enzalutamide 160 mg daily (started 04/17/2020)  BRIEF ONCOLOGIC HISTORY:  Oncology History  Prostate cancer metastatic to intrapelvic lymph node ([place])  04/07/2014 Initial Diagnosis   Prostate cancer metastatic to intrapelvic lymph node ([place])    Genetic Testing   [facility] Multi-Cancer Panel was Negative. Report date is 05/07/2021.  The Multi-Cancer + RNA Panel offered by [facility] includes sequencing and/or deletion/duplication analysis of the following 84 genes:  AIP*, ALK, APC*, ATM*, AXIN2*, BAP1*, BARD1*, BLM*, BMPR1A*, BRCA1*, BRCA2*, BRIP1*, CASR, CDC73*, CDH1*, CDK4, CDKN1B*, CDKN1C*, CDKN2A, CEBPA, CHEK2*, CTNNA1*, DICER1*, DIS3L2*, EGFR, EPCAM, FH*, FLCN*, GATA2*, GPC3, GREM1, HOXB13, HRAS, KIT, MAX*, MEN1*, MET, MITF, MLH1*, MSH2*, MSH3*, MSH6*, MUTYH*, NBN*, NF1*, NF2*, NTHL1*, PALB2*, PDGFRA, PHOX2B, PMS2*, POLD1*, POLE*, POT1*, PRKAR1A*, PTCH1*, PTEN*, RAD50*, RAD51C*, RAD51D*, RB1*, RECQL4, RET, RUNX1*, SDHA*, SDHAF2*, SDHB*, SDHC*, SDHD*, SMAD4*, SMARCA4*, SMARCB1*, SMARCE1*, STK11*, SUFU*, TERC, TERT, TMEM127*, Tp53*, TSC1*, TSC2*, VHL*, WRN*, and WT1.  RNA analysis is performed for * genes.     CANCER STAGING: Cancer Staging  Prostate cancer metastatic to intrapelvic lymph node [facility]) Staging form: Prostate, [name] 7th Edition - Clinical stage from 04/02/2021: rT2b, N1, PSA: 10 to 19, Gleason 7 - Unsigned   INTERVAL HISTORY:  Andrew [name], a 76 y.o. male, returns for routine follow-up of his metastatic castration  refractory prostate cancer. [name] was last seen on 07/29/2021.   Today he reports feeling well. He reports left flank soreness. He reports occasional constipation. He denies hot flashes and n/v/d. He is taking HCTZ. He is taking prednisone which he reports increases his appetite.   REVIEW OF SYSTEMS:  Review of Systems  Constitutional:  Positive for fatigue. Negative for appetite change.  Gastrointestinal:  Positive for constipation (occ). Negative for diarrhea, nausea and vomiting.  Endocrine: Negative for hot flashes.  Musculoskeletal:  Positive for flank pain (5/10 L).  All other systems reviewed and are negative.  PAST MEDICAL/SURGICAL HISTORY:  Past Medical History:  Diagnosis Date   Anemia    BPH (benign prostatic hyperplasia)    Sees Dr Andrew [name]   Cataract    Diabetes mellitus without complication ([place])    Hyperlipidemia    Hypertension    Low serum vitamin D    Prostate cancer ([place]) 2008   w/seed implantation and radiation   Past Surgical History:  Procedure Laterality Date   COLONOSCOPY  2015,2018   PROSTATE SURGERY  2008   seed implant    SOCIAL HISTORY:  Social History   Socioeconomic History   Marital status: Married    Spouse name: Andrew [name]   Number of children: 2   Years of education: 12   Highest education level: 12th grade  Occupational History   Occupation: [name]    Comment: Retired Geologist, engineering  Tobacco Use   Smoking status: Every Day    Packs/day: 1.00    Years: 55.00    Pack years: 55.00    Types: Cigarettes    Start date: 06/30/1965   Smokeless tobacco: Never   Tobacco comments:    Has tried nicotine replacement gum  Vaping Use   Vaping Use: Never used  Substance and Sexual Activity   Alcohol use: No   Drug use: No   Sexual activity: Yes  Other Topics Concern   Not on file  Social History Narrative   Retired, lives at home with wife Andrew [name], two grown children, enjoys gardening     Social Determinants of Systems developer Strain: Low Risk    Difficulty of Paying Living Expenses: Not hard at all  Food Insecurity: No Food Insecurity   Worried About Charity fundraiser in the Last Year: Never true   Arboriculturist in the Last Year: Never true  Transportation Needs: No Transportation Needs   Lack of Transportation (Medical): No   Lack of Transportation (Non-Medical): No  Physical Activity: Inactive   Days of Exercise per Week: 0 days   Minutes of Exercise per Session: 0 min  Stress: No Stress Concern Present   Feeling of Stress : Not at all  Social Connections: Socially Integrated   Frequency of Communication with Friends and Family: More than three times a week   Frequency of Social Gatherings with Friends and Family: More than three times a week   Attends Religious Services: More than 4 times per year   Active Member of [employer] or Organizations: Yes   Attends Music therapist: More than 4 times per year   Marital Status: Married  Human resources officer Violence: Not At Risk   Fear of Current or Ex-Partner: No   Emotionally Abused: No   Physically Abused: No   Sexually Abused: No    FAMILY HISTORY:  Family History  Problem Relation Age of Onset   Diabetes Mother    Heart disease Mother    [name] disease Mother        DIALYSIS   Emphysema Father    Deep vein thrombosis Sister    [name] disease Sister    Lung cancer Sister    Colon polyps Brother    Hypertension Brother    Gout Brother    Colon cancer Neg Hx    Esophageal cancer Neg Hx    Rectal cancer Neg Hx    Prostate cancer Neg Hx     CURRENT MEDICATIONS:  Current Outpatient Medications  Medication Sig Dispense Refill   abiraterone acetate (ZYTIGA) 250 MG tablet Take 4 tablets (1,000 mg total) by mouth daily. Take on an empty stomach 1 hour before or 2 hours after a meal 120 tablet 2   alfuzosin (UROXATRAL) 10 MG 24 hr tablet Take 1 tablet (10 mg total) by mouth at bedtime. 90 tablet 3   aspirin 81 MG tablet Take 81 mg  by mouth daily.     atorvastatin (LIPITOR) 40 MG tablet Take 1 tablet (40 mg total) by mouth daily. 90 tablet 0   cholecalciferol (VITAMIN D) 1000 UNITS tablet Take 1,000 Units by mouth daily.      clotrimazole-betamethasone (LOTRISONE) cream Apply 1 application topically 2 (two) times daily. 30 g 0   dronabinol (MARINOL) 5 MG capsule Take 1 capsule (5 mg total) by mouth 2 (two) times daily before a meal. 60 capsule 3   glucose blood test strip 1 each by Other route as needed for other. Use as instructed to test blood sugar daily.  One Touch Verio Test Strips DX: E11.65 100 each 11   lisinopril-hydrochlorothiazide (ZESTORETIC) 20-25 MG tablet Take 1 tablet by mouth daily. 90 tablet 0   metFORMIN (GLUCOPHAGE) 1000 MG  tablet Take 1 tablet (1,000 mg total) by mouth 2 (two) times daily with a meal. 180 tablet 0   predniSONE (DELTASONE) 5 MG tablet Take 1 tablet (5 mg total) by mouth daily with breakfast. 30 tablet 6   No current facility-administered medications for this visit.    ALLERGIES:  No Known Allergies  PHYSICAL EXAM:  Performance status (ECOG): 1 - Symptomatic but completely ambulatory  There were no vitals filed for this visit. Wt Readings from Last 3 Encounters:  08/21/21 203 lb (92.1 kg)  07/29/21 202 lb 1.6 oz (91.7 kg)  07/04/21 199 lb 8 oz (90.5 kg)   Physical Exam Vitals reviewed.  Constitutional:      Appearance: Normal appearance. He is obese.  Cardiovascular:     Rate and Rhythm: Normal rate and regular rhythm.     Pulses: Normal pulses.     Heart sounds: Normal heart sounds.  Pulmonary:     Effort: Pulmonary effort is normal.     Breath sounds: Normal breath sounds.  Neurological:     General: No focal deficit present.     Mental Status: He is alert and oriented to person, place, and time.  Psychiatric:        Mood and Affect: Mood normal.        Behavior: Behavior normal.     LABORATORY DATA:  I have reviewed the labs as listed.  CBC Latest Ref Rng &  Units 08/23/2021 07/29/2021 06/06/2021  WBC 4.0 - 10.5 K/uL 6.3 6.8 6.0  Hemoglobin 13.0 - 17.0 g/dL 10.8(L) 11.4(L) 11.2(L)  Hematocrit 39.0 - 52.0 % 31.9(L) 34.4(L) 33.2(L)  Platelets 150 - 400 K/uL 178 187 201   CMP Latest Ref Rng & Units 08/23/2021 07/29/2021 06/06/2021  Glucose 70 - 99 mg/dL 121(H) 119(H) 132(H)  BUN 8 - 23 mg/dL _0 Creatinine 0.61 - 1.24 mg/dL 1.29(H) 1.29(H) 1.32(H)  Sodium 135 - 145 mmol/L 139 136 138  Potassium 3.5 - 5.1 mmol/L 2.9(L) 3.4(L) 3.7  Chloride 98 - 111 mmol/L 104 103 105  CO2 22 - 32 mmol/L _1 Calcium 8.9 - 10.3 mg/dL 8.5(L) 8.6(L) 8.6(L)  Total Protein 6.5 - 8.1 g/dL 6.7 6.8 6.7  Total Bilirubin 0.3 - 1.2 mg/dL 1.0 0.6 0.5  Alkaline Phos 38 - 126 U/L 73 79 94  AST 15 - 41 U/L _2 ALT 0 - 44 U/L _3 DIAGNOSTIC IMAGING:  I have independently reviewed the scans and discussed with the patient. No results found.   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  CTAP on 08/26/2017 shows enlarged left pelvic sidewall lymph node 2.2 cm. - Lupron initiated on 08/10/2019. - CTAP on 03/09/2020 with left internal/external iliac lymph node measuring 2.1 x 2.8 cm (2.1 x 2.2 cm), craniocaudal 4.1 cm, previously 3.4 cm. - Enzalutamide to 160 mg daily started on 04/17/2020.  Discontinued end of October 2022. - PSA on 02/20/2021 (5.2), 07/11/2020 (2.0), 02/08/2020 (3.1) - CTAP on 03/07/2021 with left iliac bifurcation lymph node measuring 3.5 x 2.6 cm (previously 2.8 x 2.0 cm) - Bone scan on 03/07/2021 negative. - Left pelvic lymph node biopsy on 04/08/2021, metastatic prostate cancer. - We have reviewed  PSMA PET scan results which showed enlarged left iliac lymph node  33 mm with SUV 285.  1 mm lymph node and 4 mm node between IVC and aorta.  Activity in the right acetabulum, left iliac bone, left transverse process of T11 vertebral body, spine of the right scapula. - Abiraterone and prednisone prescribed on 07/04/2021. - Guardant 360 did not show any targetable mutations.  Did not show MSI high. - NGS testing could not be done because of less than 100 tumor cells. - Abiraterone and prednisone started on 07/15/2021.  2.  Social/family history: - He lives at home with his wife.  He is able to do household activities including working in the garden and Film/video editor.  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 - He is current active smoker, 1 pack/day for 55 years. - Sister died of lung cancer.   PLAN:  Metastatic castration refractory prostate cancer to the left iliac lymph node: -Zytiga 1000 mg and prednisone 5 mg daily started on 07/15/2021. - He has very mild hot flashes which are easily bearable. - Reviewed labs today which shows hypokalemia with potassium 2.9.  Blood pressure today is elevated at 155/70. - I think these are side effects from Zytiga.  We will replete potassium. - Will increase prednisone to 5 mg twice daily. - We will start him on potassium 20 mEq daily. - We will do anemia work-up at next visit as his hemoglobin is 10.8. - He will receive Lupron injection this week with Dr. Alyson Ingles. - RTC 4 weeks for follow-up.   2.  Smoking history: -CT part of the PET scan from 04/24/2021 did not show any evidence of lung masses.  3.  Loss of appetite: -Continue Marinol 5 mg twice daily.  4.  Bone metastasis: -We talked about denosumab but to decrease skeletal related events. - He is planning to see dentist in March.  We will start after that.   Orders placed this encounter:  No orders of the defined types were placed in this encounter.    Derek Jack, MD [place] [phone]   I,  Thana Ates, am acting as a scribe for Dr. Derek Jack.  I, Derek Jack MD, have reviewed the above documentation for accuracy and completeness, and I agree with the above.

## 2021-08-27 ENCOUNTER — Other Ambulatory Visit (HOSPITAL_COMMUNITY): Payer: Self-pay

## 2021-08-28 ENCOUNTER — Other Ambulatory Visit: Payer: Self-pay

## 2021-08-28 ENCOUNTER — Ambulatory Visit (INDEPENDENT_AMBULATORY_CARE_PROVIDER_SITE_OTHER): Payer: Medicare HMO

## 2021-08-28 DIAGNOSIS — C775 Secondary and unspecified malignant neoplasm of intrapelvic lymph nodes: Secondary | ICD-10-CM | POA: Diagnosis not present

## 2021-08-28 DIAGNOSIS — C61 Malignant neoplasm of prostate: Secondary | ICD-10-CM | POA: Diagnosis not present

## 2021-08-28 MED ORDER — LEUPROLIDE ACETATE (6 MONTH) 45 MG ~~LOC~~ KIT
45.0000 mg | PACK | Freq: Once | SUBCUTANEOUS | Status: AC
  Administered 2021-08-28: 45 mg via SUBCUTANEOUS

## 2021-08-28 NOTE — Progress Notes (Signed)
Eligard SubQ Injection  ? ?Due to Prostate Cancer patient is present today for a Eligard Injection. ? ?Medication: Eligard 6 month ?Dose: 45 mg  ?Location: right  ?Lot: [id number] ?Exp: [id number] ? ?Patient tolerated well, no complications were noted ? ?Performed by: Estill Bamberg RN ? ? ?

## 2021-09-02 DIAGNOSIS — J0100 Acute maxillary sinusitis, unspecified: Secondary | ICD-10-CM | POA: Diagnosis not present

## 2021-09-17 ENCOUNTER — Other Ambulatory Visit (HOSPITAL_COMMUNITY): Payer: Self-pay

## 2021-09-17 ENCOUNTER — Other Ambulatory Visit (HOSPITAL_COMMUNITY): Payer: Self-pay | Admitting: Hematology

## 2021-09-17 MED ORDER — ABIRATERONE ACETATE 250 MG PO TABS
1000.0000 mg | ORAL_TABLET | Freq: Every day | ORAL | 2 refills | Status: DC
  Filled 2021-09-17: qty 120, 30d supply, fill #0

## 2021-09-20 ENCOUNTER — Other Ambulatory Visit (HOSPITAL_COMMUNITY): Payer: Self-pay

## 2021-09-24 ENCOUNTER — Inpatient Hospital Stay (HOSPITAL_COMMUNITY): Payer: Medicare HMO | Attending: Hematology

## 2021-09-24 DIAGNOSIS — Z79899 Other long term (current) drug therapy: Secondary | ICD-10-CM | POA: Insufficient documentation

## 2021-09-24 DIAGNOSIS — D649 Anemia, unspecified: Secondary | ICD-10-CM | POA: Diagnosis not present

## 2021-09-24 DIAGNOSIS — C61 Malignant neoplasm of prostate: Secondary | ICD-10-CM | POA: Diagnosis not present

## 2021-09-24 DIAGNOSIS — I10 Essential (primary) hypertension: Secondary | ICD-10-CM | POA: Diagnosis not present

## 2021-09-24 DIAGNOSIS — C775 Secondary and unspecified malignant neoplasm of intrapelvic lymph nodes: Secondary | ICD-10-CM | POA: Diagnosis not present

## 2021-09-24 DIAGNOSIS — C7951 Secondary malignant neoplasm of bone: Secondary | ICD-10-CM | POA: Insufficient documentation

## 2021-09-24 LAB — COMPREHENSIVE METABOLIC PANEL
ALT: 23 U/L (ref 0–44)
AST: 20 U/L (ref 15–41)
Albumin: 3.6 g/dL (ref 3.5–5.0)
Alkaline Phosphatase: 93 U/L (ref 38–126)
Anion gap: 12 (ref 5–15)
BUN: 26 mg/dL — ABNORMAL HIGH (ref 8–23)
CO2: 25 mmol/L (ref 22–32)
Calcium: 8.9 mg/dL (ref 8.9–10.3)
Chloride: 100 mmol/L (ref 98–111)
Creatinine, Ser: 1.25 mg/dL — ABNORMAL HIGH (ref 0.61–1.24)
GFR, Estimated: >60 mL/min (ref >60)
Glucose, Bld: 356 mg/dL — ABNORMAL HIGH (ref 70–99)
Potassium: 3.9 mmol/L (ref 3.5–5.1)
Sodium: 137 mmol/L (ref 135–145)
Total Bilirubin: 0.5 mg/dL (ref 0.3–1.2)
Total Protein: 7 g/dL (ref 6.5–8.1)

## 2021-09-24 LAB — IRON AND TIBC
Iron: 86 ug/dL (ref 45–182)
Saturation Ratios: 24 % (ref 17.9–39.5)
TIBC: 357 ug/dL (ref 250–450)
UIBC: 271 ug/dL

## 2021-09-24 LAB — CBC WITH DIFFERENTIAL/PLATELET
Abs Immature Granulocytes: 0.02 10*3/uL (ref 0.00–0.07)
Basophils Absolute: 0 10*3/uL (ref 0.0–0.1)
Basophils Relative: 1 %
Eosinophils Absolute: 0.2 10*3/uL (ref 0.0–0.5)
Eosinophils Relative: 3 %
HCT: 34.6 % — ABNORMAL LOW (ref 39.0–52.0)
Hemoglobin: 11.6 g/dL — ABNORMAL LOW (ref 13.0–17.0)
Immature Granulocytes: 0 %
Lymphocytes Relative: 32 %
Lymphs Abs: 2.3 10*3/uL (ref 0.7–4.0)
MCH: 31.8 pg (ref 26.0–34.0)
MCHC: 33.5 g/dL (ref 30.0–36.0)
MCV: 94.8 fL (ref 80.0–100.0)
Monocytes Absolute: 0.5 10*3/uL (ref 0.1–1.0)
Monocytes Relative: 7 %
Neutro Abs: 4 10*3/uL (ref 1.7–7.7)
Neutrophils Relative %: 57 %
Platelets: 148 10*3/uL — ABNORMAL LOW (ref 150–400)
RBC: 3.65 MIL/uL — ABNORMAL LOW (ref 4.22–5.81)
RDW: 13.2 % (ref 11.5–15.5)
WBC: 7.1 10*3/uL (ref 4.0–10.5)
nRBC: 0 % (ref 0.0–0.2)

## 2021-09-24 LAB — FOLATE: Folate: 15.7 ng/mL (ref >5.9)

## 2021-09-24 LAB — FERRITIN: Ferritin: 62 ng/mL (ref 24–336)

## 2021-09-24 LAB — VITAMIN B12: Vitamin B-12: 267 pg/mL (ref 180–914)

## 2021-09-24 LAB — PSA: Prostatic Specific Antigen: 11.09 ng/mL — ABNORMAL HIGH (ref 0.00–4.00)

## 2021-09-24 LAB — MAGNESIUM: Magnesium: 1.8 mg/dL (ref 1.7–2.4)

## 2021-09-26 ENCOUNTER — Ambulatory Visit (INDEPENDENT_AMBULATORY_CARE_PROVIDER_SITE_OTHER): Payer: Medicare HMO | Admitting: Physician Assistant

## 2021-09-26 VITALS — BP 133/79 | HR 85 | Ht 67.0 in | Wt 198.6 lb

## 2021-09-26 DIAGNOSIS — N401 Enlarged prostate with lower urinary tract symptoms: Secondary | ICD-10-CM | POA: Diagnosis not present

## 2021-09-26 DIAGNOSIS — N3001 Acute cystitis with hematuria: Secondary | ICD-10-CM

## 2021-09-26 DIAGNOSIS — R351 Nocturia: Secondary | ICD-10-CM | POA: Diagnosis not present

## 2021-09-26 DIAGNOSIS — N138 Other obstructive and reflux uropathy: Secondary | ICD-10-CM | POA: Diagnosis not present

## 2021-09-26 DIAGNOSIS — C61 Malignant neoplasm of prostate: Secondary | ICD-10-CM | POA: Diagnosis not present

## 2021-09-26 MED ORDER — SULFAMETHOXAZOLE-TRIMETHOPRIM 800-160 MG PO TABS: 1.0000 | ORAL_TABLET | Freq: Two times a day (BID) | ORAL | 0 refills | Status: DC

## 2021-09-26 NOTE — Progress Notes (Signed)
? ?Assessment: ?1. Prostate cancer ([place])   ?2. Acute cystitis with hematuria   ?3. Nocturia   ? ? ?Plan: ?Urine culture ordered.  Bactrim DS twice daily for 7 days prescribed and will adjust therapy if indicated by culture results.  He has Lotrisone cream from previous prescription that he will use as needed.  He is reassured that there is no evidence of balanitis or yeast on exam.  Encouraged the patient to do glucometer checks and maintain tight control of his blood sugars as well as possible.  Emergency department precautions discussed.  He will continue follow-up with oncology and see Dr. Alyson Ingles as already scheduled. ? ?Chief Complaint: Urinary burning ? ? ?HPI: ?Andrew [name] is a 76 y.o. male with history of prostate CA, balanitis and nocturia who presents for evaluation of urinary burning and sensation of incomplete voiding with weakness of stream for the past 7 to 10 days.  He denies abdominal pain, fever, chills, gross hematuria.  No redness or tenderness of the urethral meatus.  The patient had recent evaluation with heme-onc and current therapy includes Lupron and enzalutamide.  He is tolerating treatments well.. ? ?UA = nitrite positive, 2+ leukocytosis, 3+ glucose, 2+ RBCs.  Microscopic exam unavailable today secondary to microscope malfunction. ? ?PVR = 24 mL ? ?Portions of the above documentation were copied from a prior visit for review purposes only. ? ?Allergies: ?No Known Allergies ? ?PMH: ?Past Medical History:  ?Diagnosis Date  ? Anemia   ? BPH (benign prostatic hyperplasia)   ? Sees Dr [name]  ? Cataract   ? Diabetes mellitus without complication ([place])   ? Hyperlipidemia   ? Hypertension   ? Low serum vitamin D   ? Prostate cancer [facility]) 2008  ? w/seed implantation and radiation  ? ? ?PSH: ?Past Surgical History:  ?Procedure Laterality Date  ? COLONOSCOPY  [id number]  ? PROSTATE SURGERY  2008  ? seed implant  ? ? ?SH: ?Social History  ? ?Tobacco Use  ? Smoking status: Every Day  ?  Packs/day:  1.00  ?  Years: 55.00  ?  Pack years: 55.00  ?  Types: Cigarettes  ?  Start date: 06/30/1965  ? Smokeless tobacco: Never  ? Tobacco comments:  ?  Has tried nicotine replacement gum  ?Vaping Use  ? Vaping Use: Never used  ?Substance Use Topics  ? Alcohol use: No  ? Drug use: No  ? ? ?ROS: ?Constitutional:  Negative for fever, chills, weight loss ?CV: Negative for chest pain ?Respiratory:  Negative for shortness of breath, wheezing, sleep apnea, frequent cough ?GI:  Negative for nausea, vomiting, bloody stool, GERD ? ?PE: ?BP 133/79   Pulse 85   Ht '5\' 7"'$  (1.702 m)   Wt 198 lb 9.6 oz (90.1 kg)   BMI 31.11 kg/m?  ?GENERAL APPEARANCE:  Well appearing, well developed, well nourished, NAD ?NECK:  Supple. Trachea midline ?ABDOMEN:  Soft, non-tender, no masses ?GU: Foreskin easily retracted fully without discomfort.  Glans penis and foreskin without erythema, tenderness, exudate consistent with yeast balanitis.  Pigment changes noted along foreskin consistent with appearance of vitiligo. ?EXTREMITIES:  Moves all extremities well, without clubbing, cyanosis, or edema ?NEUROLOGIC:  Alert and oriented x 3, normal gait, CN II-XII grossly intact ?MENTAL STATUS:  appropriate ?BACK:  Non-tender to palpation, No CVAT ?SKIN:  Warm, dry, and intact ? ?Results: ?Laboratory Data: ?Lab Results  ?Component Value Date  ? WBC 7.1 09/24/2021  ? HGB 11.6 (L) 09/24/2021  ?  HCT 34.6 (L) 09/24/2021  ? MCV 94.8 09/24/2021  ? PLT 148 (L) 09/24/2021  ? ? ?Lab Results  ?Component Value Date  ? CREATININE 1.25 (H) 09/24/2021  ? ? ?Lab Results  ?Component Value Date  ? PSA 1.6 08/10/2019  ? ? ?Lab Results  ?Component Value Date  ? TESTOSTERONE <3 (L) 04/02/2021  ? ? ?Lab Results  ?Component Value Date  ? HGBA1C 5.6 05/01/2021  ? ? ?Urinalysis ?   ?Component Value Date/Time  ? [place] YELLOW 07/02/2017 0410  ? APPEARANCEUR Clear 08/21/2021 1135  ? LABSPEC 1.018 07/02/2017 0410  ? PHURINE 7.0 07/02/2017 0410  ? GLUCOSEU Negative 08/21/2021 1135  ?  [place] NEGATIVE 07/02/2017 0410  ? BILIRUBINUR Negative 08/21/2021 1135  ? [place] NEGATIVE 07/02/2017 0410  ? PROTEINUR Negative 08/21/2021 1135  ? PROTEINUR NEGATIVE 07/02/2017 0410  ? UROBILINOGEN negative 07/19/2014 1002  ? NITRITE Negative 08/21/2021 1135  ? NITRITE NEGATIVE 07/02/2017 0410  ? LEUKOCYTESUR Negative 08/21/2021 1135  ? ? ?Lab Results  ?Component Value Date  ? LABMICR See below: 08/21/2021  ? [place] None seen 08/21/2021  ? RBCUA None seen 08/11/2018  ? LABEPIT None seen 08/21/2021  ? MUCUS neg 06/21/2013  ? BACTERIA None seen 08/21/2021  ? ? ?Pertinent Imaging: ? ? ?No results found for this or any previous visit (from the past 24 hour(s)).  ?

## 2021-09-27 ENCOUNTER — Other Ambulatory Visit: Payer: Self-pay | Admitting: Family Medicine

## 2021-09-27 ENCOUNTER — Telehealth: Payer: Self-pay

## 2021-09-27 ENCOUNTER — Other Ambulatory Visit: Payer: Self-pay

## 2021-09-27 DIAGNOSIS — E1165 Type 2 diabetes mellitus with hyperglycemia: Secondary | ICD-10-CM

## 2021-09-27 LAB — URINALYSIS, ROUTINE W REFLEX MICROSCOPIC
Bilirubin, UA: NEGATIVE
Ketones, UA: NEGATIVE
Nitrite, UA: POSITIVE — AB
Specific Gravity, UA: 1.02 (ref 1.005–1.030)
Urobilinogen, Ur: 1 mg/dL (ref 0.2–1.0)
pH, UA: 6 (ref 5.0–7.5)

## 2021-09-27 MED ORDER — SULFAMETHOXAZOLE-TRIMETHOPRIM 800-160 MG PO TABS: 1.0000 | ORAL_TABLET | Freq: Two times a day (BID) | ORAL | 0 refills | Status: DC

## 2021-09-27 NOTE — Telephone Encounter (Signed)
Refill sent to [facility] and patient notified.  ?

## 2021-09-27 NOTE — Telephone Encounter (Signed)
Patient needing medication now.  He is out.   ?It was sent to his [facility]. ? ?sulfamethoxazole-trimethoprim (BACTRIM DS) 800-160 MG tablet ? ?Call into:   ?[facility] [address] [place] [address] Phone:  [phone]  ?Fax:  [phone]  ?  ? ? ?Please advise. ? ?Thanks, ?Helene Kelp ?

## 2021-09-29 LAB — URINE CULTURE

## 2021-10-01 ENCOUNTER — Encounter: Payer: Self-pay | Admitting: *Deleted

## 2021-10-01 ENCOUNTER — Telehealth: Payer: Self-pay | Admitting: *Deleted

## 2021-10-01 ENCOUNTER — Telehealth: Payer: Medicare HMO | Admitting: *Deleted

## 2021-10-01 ENCOUNTER — Inpatient Hospital Stay (HOSPITAL_COMMUNITY): Payer: Medicare HMO | Attending: Hematology | Admitting: Hematology

## 2021-10-01 ENCOUNTER — Inpatient Hospital Stay (HOSPITAL_COMMUNITY): Payer: Medicare HMO

## 2021-10-01 DIAGNOSIS — Z923 Personal history of irradiation: Secondary | ICD-10-CM | POA: Diagnosis not present

## 2021-10-01 DIAGNOSIS — F17210 Nicotine dependence, cigarettes, uncomplicated: Secondary | ICD-10-CM | POA: Insufficient documentation

## 2021-10-01 DIAGNOSIS — Z5111 Encounter for antineoplastic chemotherapy: Secondary | ICD-10-CM | POA: Insufficient documentation

## 2021-10-01 DIAGNOSIS — C7951 Secondary malignant neoplasm of bone: Secondary | ICD-10-CM | POA: Diagnosis not present

## 2021-10-01 DIAGNOSIS — R630 Anorexia: Secondary | ICD-10-CM | POA: Insufficient documentation

## 2021-10-01 DIAGNOSIS — C61 Malignant neoplasm of prostate: Secondary | ICD-10-CM

## 2021-10-01 DIAGNOSIS — C775 Secondary and unspecified malignant neoplasm of intrapelvic lymph nodes: Secondary | ICD-10-CM | POA: Insufficient documentation

## 2021-10-01 DIAGNOSIS — R69 Illness, unspecified: Secondary | ICD-10-CM | POA: Diagnosis not present

## 2021-10-01 DIAGNOSIS — Z5189 Encounter for other specified aftercare: Secondary | ICD-10-CM | POA: Insufficient documentation

## 2021-10-01 DIAGNOSIS — Z79899 Other long term (current) drug therapy: Secondary | ICD-10-CM | POA: Insufficient documentation

## 2021-10-01 LAB — COMPREHENSIVE METABOLIC PANEL
ALT: 29 U/L (ref 0–44)
AST: 29 U/L (ref 15–41)
Albumin: 3.7 g/dL (ref 3.5–5.0)
Alkaline Phosphatase: 91 U/L (ref 38–126)
Anion gap: 8 (ref 5–15)
BUN: 30 mg/dL — ABNORMAL HIGH (ref 8–23)
CO2: 24 mmol/L (ref 22–32)
Calcium: 9.1 mg/dL (ref 8.9–10.3)
Chloride: 102 mmol/L (ref 98–111)
Creatinine, Ser: 1.72 mg/dL — ABNORMAL HIGH (ref 0.61–1.24)
GFR, Estimated: 41 mL/min — ABNORMAL LOW (ref >60)
Glucose, Bld: 446 mg/dL — ABNORMAL HIGH (ref 70–99)
Potassium: 4.4 mmol/L (ref 3.5–5.1)
Sodium: 134 mmol/L — ABNORMAL LOW (ref 135–145)
Total Bilirubin: 0.8 mg/dL (ref 0.3–1.2)
Total Protein: 7 g/dL (ref 6.5–8.1)

## 2021-10-01 LAB — CBC WITH DIFFERENTIAL/PLATELET
Abs Immature Granulocytes: 0.03 10*3/uL (ref 0.00–0.07)
Basophils Absolute: 0.1 10*3/uL (ref 0.0–0.1)
Basophils Relative: 1 %
Eosinophils Absolute: 0.1 10*3/uL (ref 0.0–0.5)
Eosinophils Relative: 1 %
HCT: 35.8 % — ABNORMAL LOW (ref 39.0–52.0)
Hemoglobin: 11.9 g/dL — ABNORMAL LOW (ref 13.0–17.0)
Immature Granulocytes: 0 %
Lymphocytes Relative: 34 %
Lymphs Abs: 2.3 10*3/uL (ref 0.7–4.0)
MCH: 30.9 pg (ref 26.0–34.0)
MCHC: 33.2 g/dL (ref 30.0–36.0)
MCV: 93 fL (ref 80.0–100.0)
Monocytes Absolute: 0.5 10*3/uL (ref 0.1–1.0)
Monocytes Relative: 7 %
Neutro Abs: 3.9 10*3/uL (ref 1.7–7.7)
Neutrophils Relative %: 57 %
Platelets: 163 10*3/uL (ref 150–400)
RBC: 3.85 MIL/uL — ABNORMAL LOW (ref 4.22–5.81)
RDW: 13.1 % (ref 11.5–15.5)
WBC: 6.8 10*3/uL (ref 4.0–10.5)
nRBC: 0 % (ref 0.0–0.2)

## 2021-10-01 NOTE — Patient Instructions (Addendum)
[place] at [facility] ?Discharge Instructions ? ?You were seen and examined today by Dr. Delton Coombes. He reviewed your most recent labs and your PSA is continuing to rise. Stop taking the Zytiga since it is no longer helping. Start taking the prednisone 5 mg every other day for 4 weeks and then stop it. Dr. Delton Coombes explained that there is no other pill options to try so we will need to start chemo depending on what the CT scan is showing. Chemo will be given intravenously through a port (button like device that goes under the skin) every 3 weeks. Please keep follow up appointments as scheduled. ? ? ?Thank you for choosing [place] at [facility] to provide your oncology and hematology care.  To afford each patient quality time with our provider, please arrive at least 15 minutes before your scheduled appointment time.  ? ?If you have a lab appointment with the [facility] please come in thru the Main Entrance and check in at the main information desk. ? ?You need to re-schedule your appointment should you arrive 10 or more minutes late.  We strive to give you quality time with our providers, and arriving late affects you and other patients whose appointments are after yours.  Also, if you no show three or more times for appointments you may be dismissed from the clinic at the providers discretion.     ?Again, thank you for choosing [facility].  Our hope is that these requests will decrease the amount of time that you wait before being seen by our physicians.       ?_____________________________________________________________ ? ?Should you have questions after your visit to [facility], please contact our office at [phone] and follow the prompts.  Our office hours are 8:00 a.m. and 4:30 p.m. Monday - Friday.  Please note that voicemails left after 4:00 p.m. may not be returned until the following business day.  We are closed  weekends and major holidays.  You do have access to a nurse 24-7, just call the main number to the clinic [phone] and do not press any options, hold on the line and a nurse will answer the phone.   ? ?For prescription refill requests, have your pharmacy contact our office and allow 72 hours.   ? ?Due to Covid, you will need to wear a mask upon entering the hospital. If you do not have a mask, a mask will be given to you at the Main Entrance upon arrival. For doctor visits, patients may have 1 support person age 88 or older with them. For treatment visits, patients can not have anyone with them due to social distancing guidelines and our immunocompromised population.  ? ?  ?

## 2021-10-01 NOTE — Telephone Encounter (Signed)
10/01/2021 ? ?Incoming call from patient regarding blood sugar control. Patient reports blood sugar readings over 300 and 400 at times. He denies symptoms of hyperglycemia. He is taking Metformin but hasn't taken Glyxambi since the latter part of 2022. It is unclear why it was stopped and removed from his med list.  ? ?I have encouraged Mr Goynes to increase his water intake and to decrease, and preferably eliminate, sugar and simple carbohydrates. He will continue to monitor his blood sugar regularly and will call PCP with repeated elevations or hyperglycemic symptoms. I have asked our [place] to schedule Mr Alred an appointment with Almyra Free. His next PCP appointment is in May. Can he be seen sooner or do you have any other recommendations for managing his blood sugar in the meantime? ? ?Forwarding to PCP for review and recommendation.  ? ?Chong Sicilian, BSN, RN-BC ?Embedded Chronic Care Manager ?[facility] ?Direct Dial: [phone] ? ?

## 2021-10-01 NOTE — Progress Notes (Signed)
? ?Andrew [name] ?[address] ?[place], [place] [address] ? ? ?CLINIC:  ?Medical Oncology/Hematology ? ?PCP:  ?[name], MD ?[address] [place] [address] ?[phone] ? ? 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 ?PRIOR THERAPY: neoadjuvant hormonal therapy, EBRT followed by brachytherapy with iodine 125 seeds in 2010 ? ?NGS Results: not done ? ?CURRENT THERAPY: Lupron injection (started 08/10/2019) and enzalutamide 160 mg daily (started 04/17/2020) ? ?BRIEF ONCOLOGIC HISTORY:  ?Oncology History  ?Prostate cancer metastatic to intrapelvic lymph node ([place])  ?04/07/2014 Initial Diagnosis  ? Prostate cancer metastatic to intrapelvic lymph node ([place]) ?  ? Genetic Testing  ? [facility] Multi-Cancer Panel was Negative. Report date is 05/07/2021. ? ?The Multi-Cancer + RNA Panel offered by [facility] includes sequencing and/or deletion/duplication analysis of the following 84 genes:  AIP*, ALK, APC*, ATM*, AXIN2*, BAP1*, BARD1*, BLM*, BMPR1A*, BRCA1*, BRCA2*, BRIP1*, CASR, CDC73*, CDH1*, CDK4, CDKN1B*, CDKN1C*, CDKN2A, CEBPA, CHEK2*, CTNNA1*, DICER1*, DIS3L2*, EGFR, EPCAM, FH*, FLCN*, GATA2*, GPC3, GREM1, HOXB13, HRAS, KIT, MAX*, MEN1*, MET, MITF, MLH1*, MSH2*, MSH3*, MSH6*, MUTYH*, NBN*, NF1*, NF2*, NTHL1*, PALB2*, PDGFRA, PHOX2B, PMS2*, POLD1*, POLE*, POT1*, PRKAR1A*, PTCH1*, PTEN*, RAD50*, RAD51C*, RAD51D*, RB1*, RECQL4, RET, RUNX1*, SDHA*, SDHAF2*, SDHB*, SDHC*, SDHD*, SMAD4*, SMARCA4*, SMARCB1*, SMARCE1*, STK11*, SUFU*, TERC, TERT, TMEM127*, Tp53*, TSC1*, TSC2*, VHL*, WRN*, and WT1.  RNA analysis is performed for * genes. ?  ? ? ?CANCER STAGING: ? Cancer Staging  ?Prostate cancer metastatic to intrapelvic lymph node ([place]) ?Staging form: Prostate, [name] 7th Edition ?- Clinical stage from 04/02/2021: rT2b, N1, PSA: 10 to 19, Gleason 7 - Unsigned ? ? ?INTERVAL HISTORY:  ?Mr. VITTORIO MOHS, a 76 y.o. 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 Giancarlos was last seen on 08/26/2021.  ? ?Today he reports feeling good, and he is accompanied by his daughter. He is taking Zytiga and tolerating it well. He continues to take potassium. He reports dysuria last week for which he was given Bactrim. He continues to do his daily activities including yard work.  ? ?REVIEW OF SYSTEMS:  ?Review of Systems  ?Constitutional:  Negative for appetite change and fatigue.  ?Respiratory:  Positive for shortness of breath.   ?Gastrointestinal:  Positive for constipation and diarrhea.  ?Genitourinary:  Positive for dysuria.   ?Neurological:  Positive for dizziness, headaches and numbness.  ?All other systems reviewed and are negative. ? ?PAST MEDICAL/SURGICAL HISTORY:  ?Past Medical History:  ?Diagnosis Date  ? Anemia   ? BPH (benign prostatic hyperplasia)   ? Sees Dr [name]  ? Cataract   ? Diabetes mellitus without complication ([place])   ? Hyperlipidemia   ? Hypertension   ? Low serum vitamin D   ? Prostate cancer [facility]) 2008  ? w/seed implantation and radiation  ? ?Past Surgical History:  ?Procedure Laterality Date  ? COLONOSCOPY  [id number]  ? PROSTATE SURGERY  2008  ? seed implant  ? ? ?SOCIAL HISTORY:  ?Social History  ? ?Socioeconomic History  ? Marital status: Married  ?  Spouse name: Mary  ? Number of children: 2  ? Years of education: 5  ? Highest education level: 12th grade  ?Occupational History  ? Occupation: [employer]  ?  Comment: Retired Geologist, engineering  ?Tobacco Use  ? Smoking status: Every Day  ?  Packs/day: 1.00  ?  Years: 55.00  ?  Pack years: 55.00  ?  Types: Cigarettes  ?  Start date: 06/30/1965  ? Smokeless tobacco: Never  ? Tobacco comments:  ?  Has tried nicotine replacement gum  ?Vaping Use  ? Vaping Use: Never used  ?Substance and Sexual Activity  ? Alcohol use: No  ? Drug use: No  ? Sexual activity: Yes  ?Other Topics Concern  ? Not on file  ?Social History Narrative  ? Retired, lives  at home with wife [name], two grown children, enjoys gardening    ? ?Social Determinants of Health  ? ?Financial Resource Strain: Low Risk   ? Difficulty of Paying Living Expenses: Not hard at all  ?Food Insecurity: No Food Insecurity  ? Worried About Charity fundraiser in the Last Year: Never true  ? Ran Out of Food in the Last Year: Never true  ?Transportation Needs: No Transportation Needs  ? Lack of Transportation (Medical): No  ? Lack of Transportation (Non-Medical): No  ?Physical Activity: Inactive  ? Days of Exercise per Week: 0 days  ? Minutes of Exercise per Session: 0 min  ?Stress: No Stress Concern Present  ? Feeling of Stress : Not at all  ?Social Connections: Socially Integrated  ? Frequency of Communication with Friends and Family: More than three times a week  ? Frequency of Social Gatherings with Friends and Family: More than three times a week  ? Attends Religious Services: More than 4 times per year  ? Active Member of Clubs or Organizations: Yes  ? Attends Archivist Meetings: More than 4 times per year  ? Marital Status: Married  ?Intimate Partner Violence: Not At Risk  ? Fear of Current or Ex-Partner: No  ? Emotionally Abused: No  ? Physically Abused: No  ? Sexually Abused: No  ? ? ?FAMILY HISTORY:  ?Family History  ?Problem Relation Age of Onset  ? Diabetes Mother   ? Heart disease Mother   ? Kidney disease Mother   ?     DIALYSIS  ? Emphysema Father   ? Deep vein thrombosis Sister   ? Kidney disease Sister   ? Lung cancer Sister   ? Colon polyps Brother   ? Hypertension Brother   ? Gout Brother   ? Colon cancer Neg Hx   ? Esophageal cancer Neg Hx   ? Rectal cancer Neg Hx   ? Prostate cancer Neg Hx   ? ? ?CURRENT MEDICATIONS:  ?Current Outpatient Medications  ?Medication Sig Dispense Refill  ? abiraterone acetate (ZYTIGA) 250 MG tablet Take 4 tablets (1,000 mg total) by mouth daily. Take on an empty stomach 1 hour before or 2 hours after a meal 120 tablet 2  ? alfuzosin (UROXATRAL) 10  MG 24 hr tablet Take 1 tablet (10 mg total) by mouth at bedtime. 90 tablet 3  ? aspirin 81 MG tablet Take 81 mg by mouth daily.    ? atorvastatin (LIPITOR) 40 MG tablet Take 1 tablet (40 mg total) by mouth daily. 90 tablet 0  ? azelastine (ASTELIN) 0.1 % nasal spray 1 spray into each nostril Two (2) times a day. Use in each nostril as directed    ? cholecalciferol (VITAMIN D) 1000 UNITS tablet Take 1,000 Units by mouth daily.     ? clotrimazole-betamethasone (LOTRISONE) cream Apply 1 application topically 2 (two) times daily. 30 g 0  ? dronabinol (MARINOL) 5 MG capsule Take 1 capsule (5 mg total) by mouth 2 (two) times daily before a meal. 60 capsule 3  ? glucose blood  test strip 1 each by Other route as needed for other. Use as instructed to test blood sugar daily.  One Touch Verio Test Strips DX: E11.65 100 each 11  ? lisinopril-hydrochlorothiazide (ZESTORETIC) 20-25 MG tablet Take 1 tablet by mouth daily. 90 tablet 0  ? metFORMIN (GLUCOPHAGE) 1000 MG tablet TAKE 1 TABLET TWICE DAILY  WITH MEALS 180 tablet 0  ? potassium chloride SA (KLOR-CON M) 20 MEQ tablet Take 1 tablet (20 mEq total) by mouth daily. 30 tablet 6  ? predniSONE (DELTASONE) 5 MG tablet Take 1 tablet (5 mg total) by mouth 2 (two) times daily with a meal. 60 tablet 6  ? sulfamethoxazole-trimethoprim (BACTRIM DS) 800-160 MG tablet Take 1 tablet by mouth 2 (two) times daily. 14 tablet 0  ? ?No current facility-administered medications for this visit.  ? ? ?ALLERGIES:  ?No Known Allergies ? ?PHYSICAL EXAM:  ?Performance status (ECOG): 1 - Symptomatic but completely ambulatory ? ?There were no vitals filed for this visit. ?Wt Readings from Last 3 Encounters:  ?09/26/21 198 lb 9.6 oz (90.1 kg)  ?08/26/21 205 lb 12.8 oz (93.4 kg)  ?08/21/21 203 lb (92.1 kg)  ? ?Physical Exam ?Vitals reviewed.  ?Constitutional:   ?   Appearance: Normal appearance. He is obese.  ?Cardiovascular:  ?   Rate and Rhythm: Normal rate and regular rhythm.  ?   Pulses: Normal  pulses.  ?   Heart sounds: Normal heart sounds.  ?Pulmonary:  ?   Effort: Pulmonary effort is normal.  ?   Breath sounds: Normal breath sounds.  ?Neurological:  ?   General: No focal deficit present.  ?   M

## 2021-10-01 NOTE — Progress Notes (Signed)
START ON PATHWAY REGIMEN - Prostate ? ? ?  A cycle is every 21 days: ?    Prednisone  ?    Docetaxel  ? ?**Always confirm dose/schedule in your pharmacy ordering system** ? ?Patient Characteristics: ?Adenocarcinoma, Recurrent/New Systemic Disease (Including Biochemical Recurrence), Castration Resistant, M1, Prior Novel Hormonal Agent, No Molecular Alteration or Targeted Therapy Exhausted, No Prior Docetaxel ?Histology: Adenocarcinoma ?Therapeutic Status: Recurrent/New Systemic Disease (Including Biochemical Recurrence) ? ?Intent of Therapy: ?Non-Curative / Palliative Intent, Discussed with Patient ?

## 2021-10-02 ENCOUNTER — Ambulatory Visit (INDEPENDENT_AMBULATORY_CARE_PROVIDER_SITE_OTHER): Payer: Medicare HMO | Admitting: Family Medicine

## 2021-10-02 ENCOUNTER — Encounter: Payer: Self-pay | Admitting: Family Medicine

## 2021-10-02 ENCOUNTER — Telehealth: Payer: Self-pay | Admitting: Family Medicine

## 2021-10-02 VITALS — BP 116/61 | HR 79 | Temp 97.0°F | Ht 67.0 in | Wt 195.0 lb

## 2021-10-02 DIAGNOSIS — E785 Hyperlipidemia, unspecified: Secondary | ICD-10-CM

## 2021-10-02 DIAGNOSIS — I10 Essential (primary) hypertension: Secondary | ICD-10-CM | POA: Diagnosis not present

## 2021-10-02 DIAGNOSIS — E1165 Type 2 diabetes mellitus with hyperglycemia: Secondary | ICD-10-CM

## 2021-10-02 DIAGNOSIS — E1169 Type 2 diabetes mellitus with other specified complication: Secondary | ICD-10-CM

## 2021-10-02 LAB — BAYER DCA HB A1C WAIVED: HB A1C (BAYER DCA - WAIVED): 7.9 % — ABNORMAL HIGH (ref 4.8–5.6)

## 2021-10-02 MED ORDER — ATORVASTATIN CALCIUM 40 MG PO TABS: 40.0000 mg | ORAL_TABLET | Freq: Every day | ORAL | 3 refills | Status: DC

## 2021-10-02 MED ORDER — DAPAGLIFLOZIN PROPANEDIOL 10 MG PO TABS: 10.0000 mg | ORAL_TABLET | Freq: Every day | ORAL | 2 refills | Status: DC

## 2021-10-02 MED ORDER — METFORMIN HCL 1000 MG PO TABS: 1000.0000 mg | ORAL_TABLET | Freq: Two times a day (BID) | ORAL | 2 refills | Status: DC

## 2021-10-02 MED ORDER — LISINOPRIL-HYDROCHLOROTHIAZIDE 20-25 MG PO TABS: 1.0000 | ORAL_TABLET | Freq: Every day | ORAL | 3 refills | Status: DC

## 2021-10-02 NOTE — Telephone Encounter (Signed)
Patient seen stacks today. ?

## 2021-10-02 NOTE — Telephone Encounter (Signed)
Appointment scheduled for today 

## 2021-10-02 NOTE — Telephone Encounter (Signed)
Lmtcb and schedule pt  ?

## 2021-10-03 ENCOUNTER — Encounter (HOSPITAL_COMMUNITY): Payer: Self-pay | Admitting: Hematology

## 2021-10-03 ENCOUNTER — Encounter (HOSPITAL_COMMUNITY): Payer: Self-pay

## 2021-10-03 LAB — CBC WITH DIFFERENTIAL/PLATELET
Basophils Absolute: 0.1 10*3/uL (ref 0.0–0.2)
Basos: 1 %
EOS (ABSOLUTE): 0.1 10*3/uL (ref 0.0–0.4)
Eos: 2 %
Hematocrit: 35.3 % — ABNORMAL LOW (ref 37.5–51.0)
Hemoglobin: 11.5 g/dL — ABNORMAL LOW (ref 13.0–17.7)
Immature Grans (Abs): 0 10*3/uL (ref 0.0–0.1)
Immature Granulocytes: 1 %
Lymphocytes Absolute: 1.3 10*3/uL (ref 0.7–3.1)
Lymphs: 19 %
MCH: 31.5 pg (ref 26.6–33.0)
MCHC: 32.6 g/dL (ref 31.5–35.7)
MCV: 97 fL (ref 79–97)
Monocytes Absolute: 0.4 10*3/uL (ref 0.1–0.9)
Monocytes: 6 %
Neutrophils Absolute: 5.1 10*3/uL (ref 1.4–7.0)
Neutrophils: 71 %
Platelets: 166 10*3/uL (ref 150–450)
RBC: 3.65 x10E6/uL — ABNORMAL LOW (ref 4.14–5.80)
RDW: 12.3 % (ref 11.6–15.4)
WBC: 7 10*3/uL (ref 3.4–10.8)

## 2021-10-03 LAB — CMP14+EGFR
ALT: 24 IU/L (ref 0–44)
AST: 24 IU/L (ref 0–40)
Albumin/Globulin Ratio: 1.7 (ref 1.2–2.2)
Albumin: 3.9 g/dL (ref 3.7–4.7)
Alkaline Phosphatase: 112 IU/L (ref 44–121)
BUN/Creatinine Ratio: 15 (ref 10–24)
BUN: 29 mg/dL — ABNORMAL HIGH (ref 8–27)
Bilirubin Total: 0.7 mg/dL (ref 0.0–1.2)
CO2: 19 mmol/L — ABNORMAL LOW (ref 20–29)
Calcium: 9 mg/dL (ref 8.6–10.2)
Chloride: 94 mmol/L — ABNORMAL LOW (ref 96–106)
Creatinine, Ser: 1.88 mg/dL — ABNORMAL HIGH (ref 0.76–1.27)
Globulin, Total: 2.3 g/dL (ref 1.5–4.5)
Glucose: 708 mg/dL (ref 70–99)
Potassium: 4.7 mmol/L (ref 3.5–5.2)
Sodium: 130 mmol/L — ABNORMAL LOW (ref 134–144)
Total Protein: 6.2 g/dL (ref 6.0–8.5)
eGFR: 37 mL/min/{1.73_m2} — ABNORMAL LOW (ref >59)

## 2021-10-03 LAB — LIPID PANEL
Chol/HDL Ratio: 4 ratio (ref 0.0–5.0)
Cholesterol, Total: 129 mg/dL (ref 100–199)
HDL: 32 mg/dL — ABNORMAL LOW (ref >39)
LDL Chol Calc (NIH): 57 mg/dL (ref 0–99)
Triglycerides: 251 mg/dL — ABNORMAL HIGH (ref 0–149)
VLDL Cholesterol Cal: 40 mg/dL (ref 5–40)

## 2021-10-03 NOTE — Progress Notes (Signed)
I met with the patient following visit with Dr. Katragadda. Written information on Taxotere as discussed by Dr. Katragadda provided. All questions addressed and answered. ?

## 2021-10-04 ENCOUNTER — Emergency Department (HOSPITAL_COMMUNITY): Payer: Medicare HMO

## 2021-10-04 ENCOUNTER — Telehealth: Payer: Self-pay

## 2021-10-04 ENCOUNTER — Encounter (HOSPITAL_COMMUNITY): Payer: Self-pay | Admitting: *Deleted

## 2021-10-04 ENCOUNTER — Observation Stay (HOSPITAL_COMMUNITY)
Admission: EM | Admit: 2021-10-04 | Discharge: 2021-10-05 | Disposition: A | Discharge status: Home / Self Care | Payer: Medicare HMO | Attending: Family Medicine | Admitting: Family Medicine

## 2021-10-04 ENCOUNTER — Other Ambulatory Visit: Payer: Self-pay

## 2021-10-04 DIAGNOSIS — F17210 Nicotine dependence, cigarettes, uncomplicated: Secondary | ICD-10-CM | POA: Diagnosis not present

## 2021-10-04 DIAGNOSIS — N179 Acute kidney failure, unspecified: Secondary | ICD-10-CM | POA: Diagnosis not present

## 2021-10-04 DIAGNOSIS — E1165 Type 2 diabetes mellitus with hyperglycemia: Secondary | ICD-10-CM | POA: Diagnosis not present

## 2021-10-04 DIAGNOSIS — R69 Illness, unspecified: Secondary | ICD-10-CM | POA: Diagnosis not present

## 2021-10-04 DIAGNOSIS — C61 Malignant neoplasm of prostate: Secondary | ICD-10-CM | POA: Diagnosis present

## 2021-10-04 DIAGNOSIS — Z7984 Long term (current) use of oral hypoglycemic drugs: Secondary | ICD-10-CM | POA: Insufficient documentation

## 2021-10-04 DIAGNOSIS — Z8546 Personal history of malignant neoplasm of prostate: Secondary | ICD-10-CM | POA: Insufficient documentation

## 2021-10-04 DIAGNOSIS — N1831 Chronic kidney disease, stage 3a: Secondary | ICD-10-CM | POA: Diagnosis not present

## 2021-10-04 DIAGNOSIS — E1169 Type 2 diabetes mellitus with other specified complication: Secondary | ICD-10-CM | POA: Diagnosis present

## 2021-10-04 DIAGNOSIS — Z794 Long term (current) use of insulin: Secondary | ICD-10-CM | POA: Insufficient documentation

## 2021-10-04 DIAGNOSIS — R739 Hyperglycemia, unspecified: Secondary | ICD-10-CM

## 2021-10-04 DIAGNOSIS — Z7982 Long term (current) use of aspirin: Secondary | ICD-10-CM | POA: Insufficient documentation

## 2021-10-04 DIAGNOSIS — R0602 Shortness of breath: Secondary | ICD-10-CM | POA: Diagnosis not present

## 2021-10-04 DIAGNOSIS — I129 Hypertensive chronic kidney disease with stage 1 through stage 4 chronic kidney disease, or unspecified chronic kidney disease: Secondary | ICD-10-CM | POA: Diagnosis not present

## 2021-10-04 DIAGNOSIS — Z79899 Other long term (current) drug therapy: Secondary | ICD-10-CM | POA: Insufficient documentation

## 2021-10-04 DIAGNOSIS — I10 Essential (primary) hypertension: Secondary | ICD-10-CM | POA: Diagnosis present

## 2021-10-04 DIAGNOSIS — E118 Type 2 diabetes mellitus with unspecified complications: Secondary | ICD-10-CM | POA: Diagnosis present

## 2021-10-04 HISTORY — DX: Acute kidney failure, unspecified: N17.9

## 2021-10-04 LAB — GLUCOSE, CAPILLARY
Glucose-Capillary: 147 mg/dL — ABNORMAL HIGH (ref 70–99)
Glucose-Capillary: 155 mg/dL — ABNORMAL HIGH (ref 70–99)
Glucose-Capillary: 182 mg/dL — ABNORMAL HIGH (ref 70–99)
Glucose-Capillary: 185 mg/dL — ABNORMAL HIGH (ref 70–99)
Glucose-Capillary: 196 mg/dL — ABNORMAL HIGH (ref 70–99)
Glucose-Capillary: 198 mg/dL — ABNORMAL HIGH (ref 70–99)

## 2021-10-04 LAB — CBC WITH DIFFERENTIAL/PLATELET
Abs Immature Granulocytes: 0.03 10*3/uL (ref 0.00–0.07)
Basophils Absolute: 0.1 10*3/uL (ref 0.0–0.1)
Basophils Relative: 1 %
Eosinophils Absolute: 0.1 10*3/uL (ref 0.0–0.5)
Eosinophils Relative: 1 %
HCT: 35.2 % — ABNORMAL LOW (ref 39.0–52.0)
Hemoglobin: 12 g/dL — ABNORMAL LOW (ref 13.0–17.0)
Immature Granulocytes: 0 %
Lymphocytes Relative: 27 %
Lymphs Abs: 1.9 10*3/uL (ref 0.7–4.0)
MCH: 31.5 pg (ref 26.0–34.0)
MCHC: 34.1 g/dL (ref 30.0–36.0)
MCV: 92.4 fL (ref 80.0–100.0)
Monocytes Absolute: 0.6 10*3/uL (ref 0.1–1.0)
Monocytes Relative: 8 %
Neutro Abs: 4.5 10*3/uL (ref 1.7–7.7)
Neutrophils Relative %: 63 %
Platelets: 167 10*3/uL (ref 150–400)
RBC: 3.81 MIL/uL — ABNORMAL LOW (ref 4.22–5.81)
RDW: 12.9 % (ref 11.5–15.5)
WBC: 7.1 10*3/uL (ref 4.0–10.5)
nRBC: 0 % (ref 0.0–0.2)

## 2021-10-04 LAB — COMPREHENSIVE METABOLIC PANEL
ALT: 33 U/L (ref 0–44)
AST: 30 U/L (ref 15–41)
Albumin: 3.8 g/dL (ref 3.5–5.0)
Alkaline Phosphatase: 114 U/L (ref 38–126)
Anion gap: 11 (ref 5–15)
BUN: 35 mg/dL — ABNORMAL HIGH (ref 8–23)
CO2: 24 mmol/L (ref 22–32)
Calcium: 9.2 mg/dL (ref 8.9–10.3)
Chloride: 99 mmol/L (ref 98–111)
Creatinine, Ser: 2.02 mg/dL — ABNORMAL HIGH (ref 0.61–1.24)
GFR, Estimated: 34 mL/min — ABNORMAL LOW (ref >60)
Glucose, Bld: 662 mg/dL (ref 70–99)
Potassium: 4.2 mmol/L (ref 3.5–5.1)
Sodium: 134 mmol/L — ABNORMAL LOW (ref 135–145)
Total Bilirubin: 1 mg/dL (ref 0.3–1.2)
Total Protein: 7.3 g/dL (ref 6.5–8.1)

## 2021-10-04 LAB — OSMOLALITY: Osmolality: 317 mOsm/kg — ABNORMAL HIGH (ref 275–295)

## 2021-10-04 LAB — CBG MONITORING, ED
Glucose-Capillary: >600 mg/dL (ref 70–99)
Glucose-Capillary: 486 mg/dL — ABNORMAL HIGH (ref 70–99)
Glucose-Capillary: 320 mg/dL — ABNORMAL HIGH (ref 70–99)

## 2021-10-04 LAB — URINALYSIS, ROUTINE W REFLEX MICROSCOPIC
Bacteria, UA: NONE SEEN
Bilirubin Urine: NEGATIVE
Glucose, UA: >=500 mg/dL — AB
Ketones, ur: NEGATIVE mg/dL
Leukocytes,Ua: NEGATIVE
Nitrite: NEGATIVE
Protein, ur: NEGATIVE mg/dL
Specific Gravity, Urine: 1.026 (ref 1.005–1.030)
pH: 5 (ref 5.0–8.0)

## 2021-10-04 MED ORDER — LACTATED RINGERS IV SOLN: INTRAVENOUS | Status: DC

## 2021-10-04 MED ORDER — DEXTROSE IN LACTATED RINGERS 5 % IV SOLN: INTRAVENOUS | Status: DC

## 2021-10-04 MED ORDER — CHLORHEXIDINE GLUCONATE CLOTH 2 % EX PADS
6.0000 | MEDICATED_PAD | Freq: Every day | CUTANEOUS | Status: DC
  Administered 2021-10-05: 6 via TOPICAL

## 2021-10-04 MED ORDER — ATORVASTATIN CALCIUM 40 MG PO TABS
40.0000 mg | ORAL_TABLET | Freq: Every day | ORAL | Status: DC
  Administered 2021-10-04: 40 mg via ORAL
  Filled 2021-10-04: qty 1

## 2021-10-04 MED ORDER — SULFAMETHOXAZOLE-TRIMETHOPRIM 800-160 MG PO TABS
1.0000 | ORAL_TABLET | Freq: Two times a day (BID) | ORAL | Status: DC
  Administered 2021-10-04 – 2021-10-05 (×2): 1 via ORAL
  Filled 2021-10-04 (×2): qty 1

## 2021-10-04 MED ORDER — ASPIRIN 81 MG PO CHEW
81.0000 mg | CHEWABLE_TABLET | Freq: Every day | ORAL | Status: DC
  Administered 2021-10-05: 81 mg via ORAL
  Filled 2021-10-04 (×5): qty 1

## 2021-10-04 MED ORDER — CYCLOBENZAPRINE HCL 10 MG PO TABS
5.0000 mg | ORAL_TABLET | Freq: Every evening | ORAL | Status: DC | PRN
  Administered 2021-10-04: 5 mg via ORAL
  Filled 2021-10-04: qty 1

## 2021-10-04 MED ORDER — ALFUZOSIN HCL ER 10 MG PO TB24
10.0000 mg | ORAL_TABLET | Freq: Every day | ORAL | Status: DC
  Administered 2021-10-04: 10 mg via ORAL
  Filled 2021-10-04 (×4): qty 1

## 2021-10-04 MED ORDER — LACTATED RINGERS IV BOLUS
20.0000 mL/kg | Freq: Once | INTRAVENOUS | Status: AC
  Administered 2021-10-04: 1796 mL via INTRAVENOUS

## 2021-10-04 MED ORDER — ENOXAPARIN SODIUM 40 MG/0.4ML IJ SOSY
40.0000 mg | PREFILLED_SYRINGE | INTRAMUSCULAR | Status: DC
  Administered 2021-10-04: 40 mg via SUBCUTANEOUS
  Filled 2021-10-04: qty 0.4

## 2021-10-04 MED ORDER — FENTANYL CITRATE PF 50 MCG/ML IJ SOSY: 25.0000 ug | PREFILLED_SYRINGE | Freq: Once | INTRAMUSCULAR | Status: DC

## 2021-10-04 MED ORDER — INSULIN REGULAR(HUMAN) IN NACL 100-0.9 UT/100ML-% IV SOLN
INTRAVENOUS | Status: DC
  Administered 2021-10-04: 13 [IU]/h via INTRAVENOUS
  Filled 2021-10-04: qty 100

## 2021-10-04 MED ORDER — INSULIN REGULAR(HUMAN) IN NACL 100-0.9 UT/100ML-% IV SOLN: INTRAVENOUS | Status: DC

## 2021-10-04 MED ORDER — DEXTROSE 50 % IV SOLN: 0.0000 mL | INTRAVENOUS | Status: DC | PRN

## 2021-10-04 MED ORDER — POTASSIUM CHLORIDE 10 MEQ/100ML IV SOLN
10.0000 meq | INTRAVENOUS | Status: AC
  Administered 2021-10-04 (×2): 10 meq via INTRAVENOUS
  Filled 2021-10-04 (×2): qty 100

## 2021-10-04 NOTE — ED Triage Notes (Signed)
States he ws advised to come in for treatment of elevated blood sugar  by PCP ?

## 2021-10-04 NOTE — ED Provider Notes (Signed)
?[facility] ?Provider Note ? ? ?CSN: [id number] ?Arrival date & time: 10/04/21  1519 ? ?  ? ?History ? ?Chief Complaint  ?Patient presents with  ? Hyperglycemia  ? ? ?Andrew [name] is a 76 y.o. male. ? ?Patient states that he has been urinating a lot and has felt weak.  For a few days.  Patient has a history of diabetes and hypertension.Marland Kitchen  He saw his doctor couple days ago and they checked his sugar and it was over 700 ? ? ?Hyperglycemia ?Associated symptoms: weakness   ?Associated symptoms: no abdominal pain, no chest pain and no fatigue   ?Weakness ?Severity:  Moderate ?Onset quality:  Sudden ?Timing:  Constant ?Progression:  Worsening ?Chronicity:  New ?Context: not alcohol use   ?Relieved by:  Nothing ?Worsened by:  Nothing ?Ineffective treatments:  None tried ?Associated symptoms: no abdominal pain, no chest pain, no cough, no diarrhea, no frequency, no headaches and no seizures   ? ?  ? ?Home Medications ?Prior to Admission medications   ?Medication Sig Start Date End Date Taking? Authorizing Provider  ?abiraterone acetate (ZYTIGA) 250 MG tablet Take 4 tablets (1,000 mg total) by mouth daily. Take on an empty stomach 1 hour before or 2 hours after a meal 09/17/21   Derek Jack, MD  ?alfuzosin (UROXATRAL) 10 MG 24 hr tablet Take 1 tablet (10 mg total) by mouth at bedtime. 08/21/21   McKenzie, Candee Furbish, MD  ?aspirin 81 MG tablet Take 81 mg by mouth daily.    [provider]  ?atorvastatin (LIPITOR) 40 MG tablet Take 1 tablet (40 mg total) by mouth daily. 10/02/21   [name], MD  ?azelastine (ASTELIN) 0.1 % nasal spray Place 1 spray into both nostrils 2 (two) times daily. 09/02/21 09/02/22  [provider]  ?cholecalciferol (VITAMIN D) 1000 UNITS tablet Take 1,000 Units by mouth daily.     [provider]  ?clotrimazole-betamethasone (LOTRISONE) cream Apply 1 application topically 2 (two) times daily. 02/08/20   McKenzie, Candee Furbish, MD  ?dapagliflozin  propanediol (FARXIGA) 10 MG TABS tablet Take 1 tablet (10 mg total) by mouth daily before breakfast. 10/02/21   [name], MD  ?dronabinol (MARINOL) 5 MG capsule Take 1 capsule (5 mg total) by mouth 2 (two) times daily before a meal. 06/06/21   Derek Jack, MD  ?glucose blood test strip 1 each by Other route as needed for other. Use as instructed to test blood sugar daily.  One Touch Verio Test Strips DX: E11.65 10/13/19   [name], MD  ?lisinopril-hydrochlorothiazide (ZESTORETIC) 20-25 MG tablet Take 1 tablet by mouth daily. 10/02/21   [name], MD  ?metFORMIN (GLUCOPHAGE) 1000 MG tablet Take 1 tablet (1,000 mg total) by mouth 2 (two) times daily with a meal. 10/02/21   [name], MD  ?potassium chloride SA (KLOR-CON M) 20 MEQ tablet Take 1 tablet (20 mEq total) by mouth daily. 08/26/21   Derek Jack, MD  ?predniSONE (DELTASONE) 5 MG tablet Take 1 tablet (5 mg total) by mouth 2 (two) times daily with a meal. 08/26/21   Derek Jack, MD  ?sulfamethoxazole-trimethoprim (BACTRIM DS) 800-160 MG tablet Take 1 tablet by mouth 2 (two) times daily. 09/27/21   Summerlin, Berneice Heinrich, PA-C  ?   ? ?Allergies    ?Patient has no known allergies.   ? ?Review of Systems   ?Review of Systems  ?Constitutional:  Negative for appetite change and fatigue.  ?HENT:  Negative for congestion, ear discharge and  sinus pressure.   ?Eyes:  Negative for discharge.  ?Respiratory:  Negative for cough.   ?Cardiovascular:  Negative for chest pain.  ?Gastrointestinal:  Negative for abdominal pain and diarrhea.  ?Genitourinary:  Negative for frequency and hematuria.  ?Musculoskeletal:  Negative for back pain.  ?Skin:  Negative for rash.  ?Neurological:  Positive for weakness. Negative for seizures and headaches.  ?Psychiatric/Behavioral:  Negative for hallucinations.   ? ?Physical Exam ?Updated Vital Signs ?BP (!) 145/67   Pulse 62   Temp 97.8 ?F (36.6 ?C) (Oral)   Resp 18   Ht '5\' 5"'$  (1.651 m)   Wt 89.8  kg   SpO2 99%   BMI 32.95 kg/m?  ?Physical Exam ?Vitals and nursing note reviewed.  ?Constitutional:   ?   Appearance: He is well-developed.  ?HENT:  ?   Head: Normocephalic.  ?   Comments: Dry mucous membrane ?   Nose: Nose normal.  ?Eyes:  ?   General: No scleral icterus. ?   Conjunctiva/sclera: Conjunctivae normal.  ?Neck:  ?   Thyroid: No thyromegaly.  ?Cardiovascular:  ?   Rate and Rhythm: Normal rate and regular rhythm.  ?   Heart sounds: No murmur heard. ?  No friction rub. No gallop.  ?Pulmonary:  ?   Breath sounds: No stridor. No wheezing or rales.  ?Chest:  ?   Chest wall: No tenderness.  ?Abdominal:  ?   General: There is no distension.  ?   Tenderness: There is no abdominal tenderness. There is no rebound.  ?Musculoskeletal:     ?   General: Normal range of motion.  ?   Cervical back: Neck supple.  ?Lymphadenopathy:  ?   Cervical: No cervical adenopathy.  ?Skin: ?   Findings: No erythema or rash.  ?Neurological:  ?   Mental Status: He is alert and oriented to person, place, and time.  ?   Motor: No abnormal muscle tone.  ?   Coordination: Coordination normal.  ?Psychiatric:     ?   Behavior: Behavior normal.  ? ? ?ED Results / Procedures / Treatments   ?Labs ?(all labs ordered are listed, but only abnormal results are displayed) ?Labs Reviewed  ?CBC WITH DIFFERENTIAL/PLATELET - Abnormal; Notable for the following components:  ?    Result Value  ? RBC 3.81 (*)   ? Hemoglobin 12.0 (*)   ? HCT 35.2 (*)   ? All other components within normal limits  ?COMPREHENSIVE METABOLIC PANEL - Abnormal; Notable for the following components:  ? Sodium 134 (*)   ? Glucose, Bld 662 (*)   ? BUN 35 (*)   ? Creatinine, Ser 2.02 (*)   ? GFR, Estimated 34 (*)   ? All other components within normal limits  ?URINALYSIS, ROUTINE W REFLEX MICROSCOPIC - Abnormal; Notable for the following components:  ? Color, Urine STRAW (*)   ? Glucose, UA >=500 (*)   ? Hgb urine dipstick SMALL (*)   ? All other components within normal limits   ?CBG MONITORING, ED - Abnormal; Notable for the following components:  ? Glucose-Capillary >600 (*)   ? All other components within normal limits  ?CBG MONITORING, ED - Abnormal; Notable for the following components:  ? Glucose-Capillary 486 (*)   ? All other components within normal limits  ?OSMOLALITY  ? ? ?EKG ?None ? ?Radiology ?DG Chest Port 1 View ? ?Result Date: 10/04/2021 ?CLINICAL DATA:  Shortness of breath EXAM: PORTABLE CHEST 1 VIEW COMPARISON:  2019 FINDINGS:  The heart size and mediastinal contours are within normal limits. Both lungs are clear. No pleural effusion. The visualized skeletal structures are unremarkable. IMPRESSION: No acute process in the chest. Electronically Signed   By: Macy Mis M.D.   On: 10/04/2021 16:14   ? ?Procedures ?Procedures  ? ? ?Medications Ordered in ED ?Medications  ?insulin regular, human (MYXREDLIN) 100 units/ 100 mL infusion (13 Units/hr Intravenous New Bag/Given 10/04/21 1634)  ?lactated ringers infusion (has no administration in time range)  ?dextrose 5 % in lactated ringers infusion (0 mLs Intravenous Hold 10/04/21 1559)  ?dextrose 50 % solution 0-50 mL (has no administration in time range)  ?potassium chloride 10 mEq in 100 mL IVPB (10 mEq Intravenous New Bag/Given 10/04/21 1657)  ?lactated ringers bolus 1,796 mL (1,796 mLs Intravenous New Bag/Given 10/04/21 1605)  ? ? ? ?ED Course/ Medical Decision Making/ A&P ? CRITICAL CARE ?Performed by: Milton Ferguson ?Total critical care time: 45 minutes ?Critical care time was exclusive of separately billable procedures and treating other patients. ?Critical care was necessary to treat or prevent imminent or life-threatening deterioration. ?Critical care was time spent personally by me on the following activities: development of treatment plan with patient and/or surrogate as well as nursing, discussions with consultants, evaluation of patient's response to treatment, examination of patient, obtaining history from patient or  surrogate, ordering and performing treatments and interventions, ordering and review of laboratory studies, ordering and review of radiographic studies, pulse oximetry and re-evaluation of patient's condition. ? ?

## 2021-10-04 NOTE — Progress Notes (Signed)
Pt. Was notified of results and directed to go to [facility] E.D. right away. ?WS

## 2021-10-04 NOTE — H&P (Signed)
?History and Physical  ? ? ?Patient: Andrew [name] YTK:[id number] DOB: 09/03/1945 ?DOA: 10/04/2021 ?DOS: the patient was seen and examined on 10/04/2021 ?PCP: [name], MD  ?Patient coming from: Home ? ?Chief Complaint:  ?Chief Complaint  ?Patient presents with  ? Hyperglycemia  ? ?HPI: Andrew [name] is a 76 y.o. male with medical history significant of diabetes type 2 on oral hypoglycemics, prostate cancer with seed implantation and radiation and hormone therapy.  Patient has been taking steroids.  He had routine labs done 2 days ago which showed an elevated blood sugar in the 600s.  He was contacted and sent to the hospital for evaluation.  He admits to having polydipsia and polyuria but denies blurred vision.  He states that he has been taking metformin once a day at night, but has not been taking it in the morning. ? ?Review of Systems: As mentioned in the history of present illness. All other systems reviewed and are negative. ?Past Medical History:  ?Diagnosis Date  ? Anemia   ? BPH (benign prostatic hyperplasia)   ? Sees Dr [name]  ? Cataract   ? Diabetes mellitus without complication ([place])   ? Hyperlipidemia   ? Hypertension   ? Low serum vitamin D   ? Prostate cancer [facility]) 2008  ? w/seed implantation and radiation  ? ?Past Surgical History:  ?Procedure Laterality Date  ? COLONOSCOPY  [id number]  ? PROSTATE SURGERY  2008  ? seed implant  ? ?Social History:  reports that he has been smoking cigarettes. He started smoking about 56 years ago. He has a 55.00 pack-year smoking history. He has never used smokeless tobacco. He reports that he does not drink alcohol and does not use drugs. ? ?No Known Allergies ? ?Family History  ?Problem Relation Age of Onset  ? Diabetes Mother   ? Heart disease Mother   ? Kidney disease Mother   ?     DIALYSIS  ? Emphysema Father   ? Deep vein thrombosis Sister   ? Kidney disease Sister   ? Lung cancer Sister   ? Colon polyps Brother   ? Hypertension Brother   ? Gout Brother   ?  Colon cancer Neg Hx   ? Esophageal cancer Neg Hx   ? Rectal cancer Neg Hx   ? Prostate cancer Neg Hx   ? ? ?Prior to Admission medications   ?Medication Sig Start Date End Date Taking? Authorizing Provider  ?acetaminophen (TYLENOL) 500 MG tablet Take 1,000 mg by mouth every 6 (six) hours as needed for mild pain or headache.   Yes [provider]  ?alfuzosin (UROXATRAL) 10 MG 24 hr tablet Take 1 tablet (10 mg total) by mouth at bedtime. 08/21/21  Yes McKenzie, Candee Furbish, MD  ?aspirin 81 MG tablet Take 81 mg by mouth daily.   Yes [provider]  ?atorvastatin (LIPITOR) 40 MG tablet Take 1 tablet (40 mg total) by mouth daily. ?Patient taking differently: Take 40 mg by mouth at bedtime. 10/02/21  Yes [name], MD  ?azelastine (ASTELIN) 0.1 % nasal spray Place 1 spray into both nostrils 2 (two) times daily as needed for allergies. 09/02/21 09/02/22 Yes [provider]  ?cholecalciferol (VITAMIN D) 1000 UNITS tablet Take 1,000 Units by mouth daily.    Yes [provider]  ?dapagliflozin propanediol (FARXIGA) 10 MG TABS tablet Take 1 tablet (10 mg total) by mouth daily before breakfast. 10/02/21  Yes [name], MD  ?lisinopril-hydrochlorothiazide (ZESTORETIC) 20-25 MG tablet  Take 1 tablet by mouth daily. 10/02/21  Yes [name], MD  ?metFORMIN (GLUCOPHAGE) 1000 MG tablet Take 1 tablet (1,000 mg total) by mouth 2 (two) times daily with a meal. ?Patient taking differently: Take 1,000 mg by mouth at bedtime. 10/02/21  Yes [name], MD  ?sulfamethoxazole-trimethoprim (BACTRIM DS) 800-160 MG tablet Take 1 tablet by mouth 2 (two) times daily. ?Patient taking differently: Take 1 tablet by mouth 2 (two) times daily. For 7 days 09/27/21  Yes Summerlin, Berneice Heinrich, PA-C  ?abiraterone acetate (ZYTIGA) 250 MG tablet Take 4 tablets (1,000 mg total) by mouth daily. Take on an empty stomach 1 hour before or 2 hours after a meal ?Patient not taking: Reported on 10/04/2021 09/17/21    Derek Jack, MD  ?clotrimazole-betamethasone (LOTRISONE) cream Apply 1 application topically 2 (two) times daily. ?Patient not taking: Reported on 10/04/2021 02/08/20   Cleon Gustin, MD  ?dronabinol (MARINOL) 5 MG capsule Take 1 capsule (5 mg total) by mouth 2 (two) times daily before a meal. ?Patient not taking: Reported on 10/04/2021 06/06/21   Derek Jack, MD  ?glucose blood test strip 1 each by Other route as needed for other. Use as instructed to test blood sugar daily.  One Touch Verio Test Strips DX: E11.65 10/13/19   [name], MD  ?potassium chloride SA (KLOR-CON M) 20 MEQ tablet Take 1 tablet (20 mEq total) by mouth daily. ?Patient not taking: Reported on 10/04/2021 08/26/21   Derek Jack, MD  ?predniSONE (DELTASONE) 5 MG tablet Take 1 tablet (5 mg total) by mouth 2 (two) times daily with a meal. ?Patient not taking: Reported on 10/04/2021 08/26/21   Derek Jack, MD  ? ? ?Physical Exam: ?Vitals:  ? 10/04/21 1529 10/04/21 1600 10/04/21 1630 10/04/21 1700  ?BP:  (!) 119/59 125/71 (!) 145/67  ?Pulse:  70 65 62  ?Resp:  '19 15 18  '$ ?Temp:      ?TempSrc:      ?SpO2:  100% 99% 99%  ?Weight: 89.8 kg     ?Height: '5\' 5"'$  (1.651 m)     ? ?General: Elderly male. Awake and alert and oriented x3. No acute cardiopulmonary distress.  ?HEENT: Normocephalic atraumatic.  Right and left ears normal in appearance.  Pupils equal, round, reactive to light. Extraocular muscles are intact. Sclerae anicteric and noninjected.  Moist mucosal membranes. No mucosal lesions.  ?Neck: Neck supple without lymphadenopathy. No carotid bruits. No masses palpated.  ?Cardiovascular: Regular rate with normal S1-S2 sounds. No murmurs, rubs, gallops auscultated. No JVD.  ?Respiratory: Good respiratory effort with no wheezes, rales, rhonchi. Lungs clear to auscultation bilaterally.  No accessory muscle use. ?Abdomen: Soft, nontender, nondistended. Active bowel sounds. No masses or hepatosplenomegaly  ?Skin: No  rashes, lesions, or ulcerations.  Dry, warm to touch. 2+ dorsalis pedis and radial pulses. ?Musculoskeletal: No calf or leg pain. All major joints not erythematous nontender.  No upper or lower joint deformation.  Good ROM.  No contractures  ?Psychiatric: Intact judgment and insight. Pleasant and cooperative. ?Neurologic: No focal neurological deficits. Strength is 5/5 and symmetric in upper and lower extremities.  Cranial nerves II through XII are grossly intact. ? ? ?Data Reviewed: ?Results for orders placed or performed during the hospital encounter of 10/04/21 (from the past 24 hour(s))  ?CBG monitoring, ED     Status: Abnormal  ? Collection Time: 10/04/21  3:32 PM  ?Result Value Ref Range  ? Glucose-Capillary >600 (HH) 70 - 99 mg/dL  ?CBC with Differential/Platelet  Status: Abnormal  ? Collection Time: 10/04/21  3:57 PM  ?Result Value Ref Range  ? WBC 7.1 4.0 - 10.5 K/uL  ? RBC 3.81 (L) 4.22 - 5.81 MIL/uL  ? Hemoglobin 12.0 (L) 13.0 - 17.0 g/dL  ? HCT 35.2 (L) 39.0 - 52.0 %  ? MCV 92.4 80.0 - 100.0 fL  ? MCH 31.5 26.0 - 34.0 pg  ? MCHC 34.1 30.0 - 36.0 g/dL  ? RDW 12.9 11.5 - 15.5 %  ? Platelets 167 150 - 400 K/uL  ? nRBC 0.0 0.0 - 0.2 %  ? Neutrophils Relative % 63 %  ? Neutro Abs 4.5 1.7 - 7.7 K/uL  ? Lymphocytes Relative 27 %  ? Lymphs Abs 1.9 0.7 - 4.0 K/uL  ? Monocytes Relative 8 %  ? Monocytes Absolute 0.6 0.1 - 1.0 K/uL  ? Eosinophils Relative 1 %  ? Eosinophils Absolute 0.1 0.0 - 0.5 K/uL  ? Basophils Relative 1 %  ? Basophils Absolute 0.1 0.0 - 0.1 K/uL  ? Immature Granulocytes 0 %  ? Abs Immature Granulocytes 0.03 0.00 - 0.07 K/uL  ?Comprehensive metabolic panel     Status: Abnormal  ? Collection Time: 10/04/21  3:57 PM  ?Result Value Ref Range  ? Sodium 134 (L) 135 - 145 mmol/L  ? Potassium 4.2 3.5 - 5.1 mmol/L  ? Chloride 99 98 - 111 mmol/L  ? CO2 24 22 - 32 mmol/L  ? Glucose, Bld 662 (HH) 70 - 99 mg/dL  ? BUN 35 (H) 8 - 23 mg/dL  ? Creatinine, Ser 2.02 (H) 0.61 - 1.24 mg/dL  ? Calcium 9.2 8.9 -  10.3 mg/dL  ? Total Protein 7.3 6.5 - 8.1 g/dL  ? Albumin 3.8 3.5 - 5.0 g/dL  ? AST 30 15 - 41 U/L  ? ALT 33 0 - 44 U/L  ? Alkaline Phosphatase 114 38 - 126 U/L  ? Total Bilirubin 1.0 0.3 - 1.2 mg/dL  ? GFR, Estimate

## 2021-10-04 NOTE — Chronic Care Management (AMB) (Signed)
?  Chronic Care Management  ? ?Note ? ?10/04/2021 ?Name: Andrew [name] MRN: [id number] DOB: 03/19/1946 ? ?Andrew [name] is a 76 y.o. year old male who is a primary care patient of Stacks, Cletus Gash, MD. Andrew [name] is currently enrolled in care management services. An additional referral for Pharm D  was placed.  ? ?Follow up plan: ?Face to Face appointment with care management team member scheduled for: 10/29/2021 ? ?Noreene Larsson, RMA ?Care Guide, Embedded Care Coordination ?[facility]  ?[place], [place] [id number] ?Direct Dial: [phone] ?Museum/[email or web address] ?[email or web address]: [email or web address]  ? ?

## 2021-10-05 ENCOUNTER — Encounter: Payer: Self-pay | Admitting: Family Medicine

## 2021-10-05 DIAGNOSIS — R739 Hyperglycemia, unspecified: Secondary | ICD-10-CM

## 2021-10-05 LAB — BASIC METABOLIC PANEL
Anion gap: 4 — ABNORMAL LOW (ref 5–15)
BUN: 23 mg/dL (ref 8–23)
CO2: 25 mmol/L (ref 22–32)
Calcium: 8.3 mg/dL — ABNORMAL LOW (ref 8.9–10.3)
Chloride: 108 mmol/L (ref 98–111)
Creatinine, Ser: 1.3 mg/dL — ABNORMAL HIGH (ref 0.61–1.24)
GFR, Estimated: 57 mL/min — ABNORMAL LOW (ref >60)
Glucose, Bld: 163 mg/dL — ABNORMAL HIGH (ref 70–99)
Potassium: 3.2 mmol/L — ABNORMAL LOW (ref 3.5–5.1)
Sodium: 137 mmol/L (ref 135–145)

## 2021-10-05 LAB — GLUCOSE, CAPILLARY
Glucose-Capillary: 156 mg/dL — ABNORMAL HIGH (ref 70–99)
Glucose-Capillary: 162 mg/dL — ABNORMAL HIGH (ref 70–99)
Glucose-Capillary: 167 mg/dL — ABNORMAL HIGH (ref 70–99)
Glucose-Capillary: 172 mg/dL — ABNORMAL HIGH (ref 70–99)
Glucose-Capillary: 183 mg/dL — ABNORMAL HIGH (ref 70–99)
Glucose-Capillary: 185 mg/dL — ABNORMAL HIGH (ref 70–99)
Glucose-Capillary: 190 mg/dL — ABNORMAL HIGH (ref 70–99)
Glucose-Capillary: 231 mg/dL — ABNORMAL HIGH (ref 70–99)

## 2021-10-05 LAB — RENAL FUNCTION PANEL
Albumin: 2.8 g/dL — ABNORMAL LOW (ref 3.5–5.0)
Anion gap: 7 (ref 5–15)
BUN: 20 mg/dL (ref 8–23)
CO2: 24 mmol/L (ref 22–32)
Calcium: 8.4 mg/dL — ABNORMAL LOW (ref 8.9–10.3)
Chloride: 107 mmol/L (ref 98–111)
Creatinine, Ser: 1.39 mg/dL — ABNORMAL HIGH (ref 0.61–1.24)
GFR, Estimated: 53 mL/min — ABNORMAL LOW (ref >60)
Glucose, Bld: 164 mg/dL — ABNORMAL HIGH (ref 70–99)
Phosphorus: 3 mg/dL (ref 2.5–4.6)
Potassium: 3.4 mmol/L — ABNORMAL LOW (ref 3.5–5.1)
Sodium: 138 mmol/L (ref 135–145)

## 2021-10-05 LAB — BETA-HYDROXYBUTYRIC ACID: Beta-Hydroxybutyric Acid: 0.12 mmol/L (ref 0.05–0.27)

## 2021-10-05 MED ORDER — INSULIN ASPART 100 UNIT/ML IJ SOLN: 0.0000 [IU] | Freq: Every day | INTRAMUSCULAR | Status: DC

## 2021-10-05 MED ORDER — ASPIRIN 81 MG PO TABS: 81.0000 mg | ORAL_TABLET | Freq: Every day | ORAL | 3 refills | Status: DC

## 2021-10-05 MED ORDER — METFORMIN HCL 1000 MG PO TABS: 1000.0000 mg | ORAL_TABLET | Freq: Two times a day (BID) | ORAL | 2 refills | Status: DC

## 2021-10-05 MED ORDER — ACETAMINOPHEN 325 MG PO TABS
650.0000 mg | ORAL_TABLET | Freq: Four times a day (QID) | ORAL | Status: DC | PRN
  Administered 2021-10-05: 650 mg via ORAL
  Filled 2021-10-05: qty 2

## 2021-10-05 MED ORDER — INSULIN ASPART 100 UNIT/ML IJ SOLN
10.0000 [IU] | Freq: Once | INTRAMUSCULAR | Status: AC
  Administered 2021-10-05: 10 [IU] via SUBCUTANEOUS

## 2021-10-05 MED ORDER — POTASSIUM CHLORIDE CRYS ER 20 MEQ PO TBCR
40.0000 meq | EXTENDED_RELEASE_TABLET | ORAL | Status: AC
  Administered 2021-10-05 (×2): 40 meq via ORAL
  Filled 2021-10-05 (×2): qty 2

## 2021-10-05 MED ORDER — INSULIN GLARGINE-YFGN 100 UNIT/ML ~~LOC~~ SOLN
10.0000 [IU] | Freq: Once | SUBCUTANEOUS | Status: AC
  Administered 2021-10-05: 10 [IU] via SUBCUTANEOUS
  Filled 2021-10-05: qty 0.1

## 2021-10-05 MED ORDER — INSULIN ASPART 100 UNIT/ML IJ SOLN
0.0000 [IU] | Freq: Three times a day (TID) | INTRAMUSCULAR | Status: DC
  Administered 2021-10-05: 5 [IU] via SUBCUTANEOUS

## 2021-10-05 MED ORDER — GLIMEPIRIDE 2 MG PO TABS: 2.0000 mg | ORAL_TABLET | Freq: Two times a day (BID) | ORAL | 11 refills | Status: DC

## 2021-10-05 NOTE — Discharge Instructions (Signed)
1)Please drink Plenty Fluids--avoid dehydration ?2) please take Amaryl/glimepiride 2 mg twice daily with food every time you start taking prednisone/steroid medication ?3) please take metformin 1000 mg twice daily and Farxiga (Dapagliflozin Propanediol) 10 mg tablets with breakfast daily with meals all the time whether you are taking prednisone or not ?4) please follow-up with your primary care physician in 1 week for recheck and reevaluation and repeat CBC and BMP blood work ?

## 2021-10-05 NOTE — Discharge Summary (Signed)
?                                                                                ? ? ?Andrew [name], is a 76 y.o. male  DOB 1946/06/11  MRN [id number]. ? ?Admission date:  10/04/2021  Admitting Physician  Truett Mainland, DO ? ?Discharge Date:  10/05/2021  ? ?Primary MD  [name], MD ? ?Recommendations for primary care physician for things to follow:  ? ?1)Please drink Plenty Fluids--avoid dehydration ?2) please take Amaryl/glimepiride 2 mg twice daily with food every time you start taking prednisone/steroid medication ?3) please take metformin 1000 mg twice daily and Farxiga (Dapagliflozin Propanediol) 10 mg tablets with breakfast daily with meals all the time whether you are taking prednisone or not ?4) please follow-up with your primary care physician in 1 week for recheck and reevaluation and repeat CBC and BMP blood work ? ?Admission Diagnosis  Hyperglycemia [R73.9] ?Hyperglycemia due to diabetes mellitus ([place]) [E11.65] ? ? ?Discharge Diagnosis  Hyperglycemia [R73.9] ?Hyperglycemia due to diabetes mellitus ([place]) [E11.65]   ? ?Principal Problem: ?  Hyperglycemia due to diabetes mellitus ([place]) ?Active Problems: ?  Essential hypertension ?  Hyperlipidemia associated with type 2 diabetes mellitus ([place]) ?  Prostate cancer metastatic to intrapelvic lymph node ([place]) ?  Type 2 diabetes mellitus with complication, without long-term current use of insulin ([place]) ?  AKI (acute kidney injury) ([place]) ?    ? ?Past Medical History:  ?Diagnosis Date  ? Anemia   ? BPH (benign prostatic hyperplasia)   ? Sees Dr [name]  ? Cataract   ? Diabetes mellitus without complication ([place])   ? Hyperlipidemia   ? Hypertension   ? Low serum vitamin D   ? Prostate cancer [facility]) 2008  ? w/seed implantation and radiation  ? ? ?Past Surgical History:  ?Procedure Laterality Date  ? COLONOSCOPY  [id number]  ? PROSTATE SURGERY  2008  ? seed implant  ? ? ? HPI  from the history and physical done on the day of admission:  ? ? HPI: Andrew [name] is a 76  y.o. male with medical history significant of diabetes type 2 on oral hypoglycemics, prostate cancer with seed implantation and radiation and hormone therapy.  Patient has been taking steroids.  He had routine labs done 2 days ago which showed an elevated blood sugar in the 600s.  He was contacted and sent to the hospital for evaluation.  He admits to having polydipsia and polyuria but denies blurred vision.  He states that he has been taking metformin once a day at night, but has not been taking it in the morning. ?  ?Review of Systems: As mentioned in the history of present illness. All other systems reviewed and are negative ? ? ? Hospital Course:  ? ? ?Assessment and Plan: ? ?1) uncontrolled DM with severe hyperglycemia ?-A1c 7.9 reflecting uncontrolled DM with hyperglycemia PTA ?-Blood glucose was 708 couple days ago and 662 on admission ?-Suspect persistent severe hyperglycemia due to recent steroid use ?-Patient's bicarb was 19 couple of days ago bicarb up to 25 at the time of discharge ?-Anion gap is not elevated beta- hydroxybutyric acid is  not elevated ?-Patient received IV fluids and IV insulin ?-He has been transitioned to subcu insulin ?-Okay to discharge on PTA metformin 1 g twice daily ?-Patient may take Amaryl 2 mg twice daily with meals when he is taking prednisone for his chemo/malignancy management ?-While not taking prednisone patient should take only metformin ?-Patient verbalizes understanding of these instructions ? ?2)HTN--resume PTA meds ? ?3) prostate cancer--- follow-up with oncologist for ongoing management ? ?4)AKI on CKD 3A--creatinine on admission was 2.02 with hydration creatinine is down to 1.3 ?-Follow-up with PCP for repeat BMP and further management ? ?Discharge Condition: Stable ? ?Follow UP ? ? Follow-up Information   ? ? [name], MD Follow up in 1 week(s).   ?Specialty: Family Medicine ?Why: Repeat CBC and BMP blood work ?Contact information: ?[address] ?[place] [address] ?[phone] ? ? ?  ?  ? ?  ?  ? ?  ?  ?Diet and Activity recommendation:  As advised ? ?Discharge Instructions   ? ?Discharge Instructions   ? ? Call MD for:  difficulty breathing, headache or visual disturbances   Complete by: As directed ?  ? Call MD for:  persistant dizziness or light-headedness   Complete by: As directed ?  ? Call MD for:  persistant nausea and vomiting   Complete by: As directed ?  ? Call MD for:  temperature >100.4   Complete by: As directed ?  ? Diet - low sodium heart healthy   Complete by: As directed ?  ? Diet Carb Modified   Complete by: As directed ?  ? Discharge instructions   Complete by: As directed ?  ? 1)Please drink Plenty Fluids--avoid dehydration ?2) please take Amaryl/glimepiride 2 mg twice daily with food every time you start taking prednisone/steroid medication ?3) please take metformin 1000 mg twice daily and Farxiga (Dapagliflozin Propanediol) 10 mg tablets with breakfast daily with meals all the time whether you are taking prednisone or not ?4) please follow-up with your primary care physician in 1 week for recheck and reevaluation and repeat CBC and BMP blood work  ? Increase activity slowly   Complete by: As directed ?  ? ?  ? ? ? ? Discharge Medications  ? ?  ?Allergies as of 10/05/2021   ?No Known Allergies ?  ? ?  ?Medication List  ?  ? ?TAKE these medications   ? ?abiraterone acetate 250 MG tablet ?Commonly known as: ZYTIGA ?Take 4 tablets (1,000 mg total) by mouth daily. Take on an empty stomach 1 hour before or 2 hours after a meal ?  ?acetaminophen 500 MG tablet ?Commonly known as: TYLENOL ?Take 1,000 mg by mouth every 6 (six) hours as needed for mild pain or headache. ?  ?alfuzosin 10 MG 24 hr tablet ?Commonly known as: UROXATRAL ?Take 1 tablet (10 mg total) by mouth at bedtime. ?  ?aspirin 81 MG tablet ?Take 1 tablet (81 mg total) by mouth daily with breakfast. ?What changed: when to take this ?  ?atorvastatin 40 MG tablet ?Commonly known as:  LIPITOR ?Take 1 tablet (40 mg total) by mouth daily. ?What changed: when to take this ?  ?azelastine 0.1 % nasal spray ?Commonly known as: ASTELIN ?Place 1 spray into both nostrils 2 (two) times daily as needed for allergies. ?  ?cholecalciferol 1000 units tablet ?Commonly known as: VITAMIN D ?Take 1,000 Units by mouth daily. ?  ?clotrimazole-betamethasone cream ?Commonly known as: Lotrisone ?Apply 1 application topically 2 (two) times daily. ?  ?  dapagliflozin propanediol 10 MG Tabs tablet ?Commonly known as: [place] ?Take 1 tablet (10 mg total) by mouth daily before breakfast. ?  ?dronabinol 5 MG capsule ?Commonly known as: MARINOL ?Take 1 capsule (5 mg total) by mouth 2 (two) times daily before a meal. ?  ?glimepiride 2 MG tablet ?Commonly known as: Amaryl ?Take 1 tablet (2 mg total) by mouth 2 (two) times daily before a meal. To be taken only while you are on prednisone/steroid medication ?  ?glucose blood test strip ?1 each by Other route as needed for other. Use as instructed to test blood sugar daily.  One Touch Verio Test Strips DX: E11.65 ?  ?lisinopril-hydrochlorothiazide 20-25 MG tablet ?Commonly known as: ZESTORETIC ?Take 1 tablet by mouth daily. ?  ?metFORMIN 1000 MG tablet ?Commonly known as: GLUCOPHAGE ?Take 1 tablet (1,000 mg total) by mouth 2 (two) times daily with a meal. ?What changed: when to take this ?  ?potassium chloride SA 20 MEQ tablet ?Commonly known as: KLOR-CON M ?Take 1 tablet (20 mEq total) by mouth daily. ?  ?predniSONE 5 MG tablet ?Commonly known as: DELTASONE ?Take 1 tablet (5 mg total) by mouth 2 (two) times daily with a meal. ?  ?sulfamethoxazole-trimethoprim 800-160 MG tablet ?Commonly known as: BACTRIM DS ?Take 1 tablet by mouth 2 (two) times daily. ?What changed: additional instructions ?  ? ?  ? ? ?Major procedures and Radiology Reports - PLEASE review detailed and final reports for all details, in brief -  ? ? ?DG Chest Port 1 View ? ?Result Date: 10/04/2021 ?CLINICAL DATA:   Shortness of breath EXAM: PORTABLE CHEST 1 VIEW COMPARISON:  2019 FINDINGS: The heart size and mediastinal contours are within normal limits. Both lungs are clear. No pleural effusion. The visualized skeletal structure

## 2021-10-05 NOTE — Progress Notes (Signed)
Inpatient Diabetes Program Recommendations ? ?AACE/ADA: New Consensus Statement on Inpatient Glycemic Control (2015) ? ?Target Ranges:  Prepandial:   less than 140 mg/dL ?     Peak postprandial:   less than 180 mg/dL (1-2 hours) ?     Critically ill patients:  140 - 180 mg/dL  ? ?Lab Results  ?Component Value Date  ? GLUCAP 190 (H) 10/05/2021  ? HGBA1C 7.9 (H) 10/02/2021  ? ? ?Review of Glycemic Control ? Latest Reference Range & Units 10/04/21 15:57  ?Glucose 70 - 99 mg/dL 662 (HH)  ?(HH): Data is critically high ? ?Diabetes history: DM2 ?Outpatient Diabetes medications: Metformin 1000 mg QHS, Farxiga 10 mg QD ?Current orders for Inpatient glycemic control: IV insulin ? ?Inpatient Diabetes Program Recommendations:   ? ?When criteria met and MD is ready to transition to SQ insulin, please consider: ? ?1-Semglee 12 units 2 hrs prior to discontinuing IV insulin ?2-Novolog 0-9 units Q4H X 24H then ac/hs ?3-Carb modified diet ? ?Will continue to follow while inpatient. ? ?Thank you, ?Reche Dixon, MSN, RN ?Diabetes Coordinator ?Inpatient Diabetes Program ?[phone] (team pager from 8a-5p) ? ? ? ? ? ?

## 2021-10-05 NOTE — Progress Notes (Signed)
Two peripheral IV's removed. D/C instructions reviewed with patient via teach back method. This RN also circled and highlighted  medication change and important D/C info on paperwork. Pt verbalizes understanding. He is awaiting wife's arrival for transport home. Kierrah Kilbride Leylani Duley,RN  ?

## 2021-10-05 NOTE — Progress Notes (Signed)
? ?Subjective:  ?Patient ID: Andrew [name], male    DOB: 1945/07/27  Age: 76 y.o. MRN: [id number] ? ?CC: Diabetes ? ? ?HPI ?Andrew [name] presents forFollow-up of diabetes. Patient checks blood sugar at home.  ?He brings in several readings expressing his concern that its been from the mid 300s to over 400 in the last several days.  He reports that he has been on prednisone due to his cancer treatments and that he discontinued his diabetes medication about 3 months ago.  It is unclear why at this was discontinued but he intends to get back on it.  He says he just feels bad in general.  He is not had any specific symptoms. ?Patient denies symptoms such as polyuria, polydipsia, excessive hunger, nausea ?No significant hypoglycemic spells noted. ? ? ?History ?Andrew [name] has a past medical history of Anemia, BPH (benign prostatic hyperplasia), Cataract, Diabetes mellitus without complication ([place]), Hyperlipidemia, Hypertension, Low serum vitamin D, and Prostate cancer ([place]) (2008).  ? ?He has a past surgical history that includes Prostate surgery (2008) and Colonoscopy (0932,3557).  ? ?His family history includes Colon polyps in his brother; Deep vein thrombosis in his sister; Diabetes in his mother; Emphysema in his father; Gout in his brother; Heart disease in his mother; Hypertension in his brother; Kidney disease in his mother and sister; Lung cancer in his sister.He reports that he has been smoking cigarettes. He started smoking about 56 years ago. He has a 55.00 pack-year smoking history. He has never used smokeless tobacco. He reports that he does not drink alcohol and does not use drugs. ? ?No current facility-administered medications on file prior to visit.  ? ?Current Outpatient Medications on File Prior to Visit  ?Medication Sig Dispense Refill  ? abiraterone acetate (ZYTIGA) 250 MG tablet Take 4 tablets (1,000 mg total) by mouth daily. Take on an empty stomach 1 hour before or 2 hours after a meal (Patient not  taking: Reported on 10/04/2021) 120 tablet 2  ? alfuzosin (UROXATRAL) 10 MG 24 hr tablet Take 1 tablet (10 mg total) by mouth at bedtime. 90 tablet 3  ? azelastine (ASTELIN) 0.1 % nasal spray Place 1 spray into both nostrils 2 (two) times daily as needed for allergies.    ? cholecalciferol (VITAMIN D) 1000 UNITS tablet Take 1,000 Units by mouth daily.     ? clotrimazole-betamethasone (LOTRISONE) cream Apply 1 application topically 2 (two) times daily. (Patient not taking: Reported on 10/04/2021) 30 g 0  ? dronabinol (MARINOL) 5 MG capsule Take 1 capsule (5 mg total) by mouth 2 (two) times daily before a meal. (Patient not taking: Reported on 10/04/2021) 60 capsule 3  ? glucose blood test strip 1 each by Other route as needed for other. Use as instructed to test blood sugar daily.  One Touch Verio Test Strips DX: E11.65 100 each 11  ? potassium chloride SA (KLOR-CON M) 20 MEQ tablet Take 1 tablet (20 mEq total) by mouth daily. (Patient not taking: Reported on 10/04/2021) 30 tablet 6  ? predniSONE (DELTASONE) 5 MG tablet Take 1 tablet (5 mg total) by mouth 2 (two) times daily with a meal. (Patient not taking: Reported on 10/04/2021) 60 tablet 6  ? sulfamethoxazole-trimethoprim (BACTRIM DS) 800-160 MG tablet Take 1 tablet by mouth 2 (two) times daily. (Patient taking differently: Take 1 tablet by mouth 2 (two) times daily. For 7 days) 14 tablet 0  ? ? ?ROS ?Review of Systems  ?Constitutional:  Negative for fever.  ?Respiratory:  Negative for shortness of breath.   ?Cardiovascular:  Negative for chest pain.  ?Endocrine: Negative.   ?Musculoskeletal:  Negative for arthralgias.  ?Skin:  Negative for rash.  ? ?Objective:  ?BP 116/61   Pulse 79   Temp (!) 97 ?F (36.1 ?C)   Ht _0  (1.702 m)   Wt 195 lb (88.5 kg)   SpO2 97%   BMI 30.54 kg/m?  ? ?BP Readings from Last 3 Encounters:  ?10/05/21 (!) 153/54  ?10/02/21 116/61  ?09/26/21 133/79  ? ? ?Wt Readings from Last 3 Encounters:  ?10/04/21 193 lb 5.5 oz (87.7 kg)  ?10/02/21 195  lb (88.5 kg)  ?09/26/21 198 lb 9.6 oz (90.1 kg)  ? ? ? ?Physical Exam ?Vitals reviewed.  ?Constitutional:   ?   Appearance: He is well-developed.  ?HENT:  ?   Head: Normocephalic and atraumatic.  ?   Right Ear: External ear normal.  ?   Left Ear: External ear normal.  ?   Mouth/Throat:  ?   Pharynx: No oropharyngeal exudate or posterior oropharyngeal erythema.  ?Eyes:  ?   Pupils: Pupils are equal, round, and reactive to light.  ?Cardiovascular:  ?   Rate and Rhythm: Normal rate and regular rhythm.  ?   Heart sounds: No murmur heard. ?Pulmonary:  ?   Effort: No respiratory distress.  ?   Breath sounds: Normal breath sounds.  ?Musculoskeletal:  ?   Cervical back: Normal range of motion and neck supple.  ?Neurological:  ?   Mental Status: He is alert and oriented to person, place, and time.  ? ? ? ? ?Assessment & Plan:  ? ?Andrew [name] was seen today for diabetes. ? ?Diagnoses and all orders for this visit: ? ?Uncontrolled type 2 diabetes mellitus with hyperglycemia ([place]) ?-     Bayer DCA Hb A1c Waived ?-     Discontinue: metFORMIN (GLUCOPHAGE) 1000 MG tablet; Take 1 tablet (1,000 mg total) by mouth 2 (two) times daily with a meal. (Patient taking differently: Take 1,000 mg by mouth at bedtime.) ? ?Essential hypertension ?-     CBC with Differential/Platelet ?-     CMP14+EGFR ?-     lisinopril-hydrochlorothiazide (ZESTORETIC) 20-25 MG tablet; Take 1 tablet by mouth daily. ? ?Hyperlipidemia associated with type 2 diabetes mellitus ([place]) ?-     Lipid panel ?-     atorvastatin (LIPITOR) 40 MG tablet; Take 1 tablet (40 mg total) by mouth daily. (Patient taking differently: Take 40 mg by mouth at bedtime.) ? ?Other orders ?-     dapagliflozin propanediol (FARXIGA) 10 MG TABS tablet; Take 1 tablet (10 mg total) by mouth daily before breakfast. ? ? ? ? ? ?I have discontinued [place] metFORMIN. I am also having him start on dapagliflozin propanediol. Additionally, I am having him maintain his cholecalciferol, glucose blood,  clotrimazole-betamethasone, dronabinol, alfuzosin, predniSONE, potassium chloride SA, abiraterone acetate, sulfamethoxazole-trimethoprim, azelastine, atorvastatin, and lisinopril-hydrochlorothiazide. ? ?Meds ordered this encounter  ?Medications  ? atorvastatin (LIPITOR) 40 MG tablet  ?  Sig: Take 1 tablet (40 mg total) by mouth daily.  ?  Dispense:  90 tablet  ?  Refill:  3  ?  Requesting 1 year supply  ? lisinopril-hydrochlorothiazide (ZESTORETIC) 20-25 MG tablet  ?  Sig: Take 1 tablet by mouth daily.  ?  Dispense:  90 tablet  ?  Refill:  3  ?  Requesting 1 year supply  ? DISCONTD: metFORMIN (GLUCOPHAGE) 1000 MG tablet  ?  Sig: Take 1  tablet (1,000 mg total) by mouth 2 (two) times daily with a meal.  ?  Dispense:  180 tablet  ?  Refill:  2  ? dapagliflozin propanediol (FARXIGA) 10 MG TABS tablet  ?  Sig: Take 1 tablet (10 mg total) by mouth daily before breakfast.  ?  Dispense:  30 tablet  ?  Refill:  2  ? ? ? ?Follow-up: Return in about 2 weeks (around 10/16/2021), or if symptoms worsen or fail to improve. ? ?[name], M.D. ?

## 2021-10-06 NOTE — Patient Instructions (Signed)
[place] ?Chemotherapy Teaching ?  ?  ?You are diagnosed with metastatic (Stage IV) prostate cancer.  We will treat you in the clinic every 3 weeks for a total of 6 cycles with a chemotherapy drug called docetaxel (Taxotere).  The intent of treatment is to shrink and control this cancer, prevent it from spreading further, and to alleviate any symptoms you may be having related to this disease. You will see the doctor regularly throughout treatment.  We will obtain blood work from you prior to every treatment and monitor your results to make sure it is safe to give your treatment. The doctor monitors your response to treatment by the way you are feeling, your blood work, and by obtaining scans periodically.  There will be wait times while you are here for treatment.  It will take about 30 minutes to 1 hour for your lab work to result.  Then there will be wait times while pharmacy mixes your medications.   ?  ?  ?Medications you will receive in the clinic prior to your chemotherapy medications: ?  ?Aloxi:  ALOXI is used in adults to help prevent nausea and vomiting that happens with certain chemotherapy drugs.  Aloxi is a long acting medication, and will remain in your system for about two days.  ?  ?Dexamethasone:  This is a steroid given prior to chemotherapy to help prevent allergic reactions; it may also help prevent and control nausea and diarrhea.   ?  ?  ?  ?Docetaxel (Taxotere)  ?  ?About This Drug  ?  ?Docetaxel is used to treat cancer. It is given in the vein (IV). It will take 1 hour to infuse.  The first infusion will take longer (about 1.5 hours) due to the fact that we start this drug at a very slow rate and gradually increase the rate of infusion until the maximum rate is reached.  This is done to monitor for infusion reactions, and to make sure you will tolerated this drug without adverse effects.  If you tolerate the first infusion, all subsequent infusions will be started at the  normal rate and given over 1 hour. ?  ?Possible Side Effects  ?  ? Bone marrow suppression. This is a decrease in the number of white blood cells, red blood cells, and platelets. This may raise your risk of infection, make you tired and weak, and raise your risk of bleeding.  ?  ? Neutropenic fever. A type of fever that can develop when you have a very low number of white blood cells which can be life-threatening.  ?  ? Soreness of the mouth and throat. You may have red areas, white patches, or sores that hurt.  ?  ? Nausea and vomiting (throwing up)  ?  ? Constipation (not able to move bowels)  ?  ? Diarrhea (loose bowel movements)  ?  ? Infections  ?  ? Fluid retention - swelling of the hands, feet, or any other part of the body. Fluid may build-up around your lungs and/or heart.  ?  ? Changes in the way food and drinks taste  ?  ? Effects on the nerves are called peripheral neuropathy. You may feel numbness, tingling, or pain in your hands and feet. It may be hard for you to button your clothes, open jars, or walk as usual. The effect on the nerves may get worse with more doses of the drug. These effects get better in some people after  the drug is stopped but it does not get better in all people.  ?  ? Decreased appetite (decreased hunger)  ?  ? Weakness  ?  ? Pain  ?  ? Muscle pain/aching  ?  ? Trouble breathing  ?  ? Changes in your nail color, you may have nail loss and/or brittle nail  ?  ? Hair loss. Hair loss is often temporary, although there have been cases of permanent hair loss reported. Hair loss may happen suddenly or gradually. If you lose hair, you may lose it from your head, face, armpits, pubic area, chest, and/or legs. You may also notice your hair getting thin.  ?  ? Allergic skin reaction. You may develop blisters on your skin that are filled with fluid or a severe red rash all over your body that may be painful.  ?  ? Allergic reactions, including anaphylaxis are rare but may happen in some  patients. Signs of allergic reaction to this drug may be swelling of the face, feeling like your tongue or throat are swelling, trouble breathing, rash, itching, fever, chills, feeling dizzy, and/or feeling that your heart is beating in a fast or not normal way. If this happens, do not take another dose of this drug. You should get urgent medical treatment.  ?  ?Note: Not all possible side effects are included above.  ?  ?Warnings and Precautions  ?  ? Severe bone marrow suppression, including febrile neutropenia which can be life-threatening  ?  ? Severe allergic reactions, including anaphylaxis which can be life-threatening  Colitis, which is swelling (inflammation) in the colon - symptoms are diarrhea (loose bowel movements), stomach cramping, and sometimes blood in the bowel movements  Severe skin reactions, including redness, swelling, or peeling of skin  Severe swelling in the eye or other changes in eyesight  ?  ? Severe fluid retention  ?  ? If you have a history of abnormal liver function, receive high doses of docetaxel, or have a history of lung cancer and have received treatment with a platinum (type of chemotherapy medication), you have an increased risk of death.  ?  ? Severe weakness  ?  ? This drug may raise your risk of getting a second cancer such as leukemia, lymphoma, myelodysplastic syndrome, and kidney cancer.  ?  ? Severe peripheral neuropathy  ?  ? This drug contains alcohol and may affect your central nervous system. The central nervous system is made up of your brain and spinal cord. You may feel dizzy and very sleepy.  ?  ? Tumor lysis syndrome: This drug may act on the cancer cells very quickly. This may affect how your kidneys work. Note: Some of the side effects above are very rare. If you have concerns and/or questions, please discuss them with your medical team. Important Information  ?  ? This drug may be present in the saliva, tears, sweat, urine, stool, vomit, semen, and vaginal  secretions. Talk to your doctor and/or your nurse about the necessary precautions to take during this time.  ?  ? This drug may impair your ability to drive or use machinery. Use caution and tell your nurse or doctor if you feel dizzy, very sleepy, and/or experience low blood pressure. Treating Side Effects  ?  ? Manage tiredness by pacing your activities for the day.  ?  ? Be sure to include periods of rest between energy-draining activities.  ?  ? Get regular exercise. If you feel too  tired to exercise vigorously, try taking a short walk.   ?  ? To decrease the risk of infection, wash your hands regularly.  ?  ? Avoid close contact with people who have a cold, the flu, or other infections.  ?  ? Take your temperature as your doctor or nurse tells you, and whenever you feel like you may have a fever.  ?  ? To help decrease the risk of bleeding, use a soft toothbrush. Check with your nurse before using dental floss.  ?  ? Be very careful when using knives or tools.  ?  ? Use an electric shaver instead of a razor.  ?  ? Mouth care is very important and will help food taste better and improve your appetite. Your mouth care should consist of routine, gentle cleaning of your teeth or dentures and rinsing your mouth with a mixture of 1/2 teaspoon of salt in 8 ounces of water or 1/2 teaspoon of baking soda in 8 ounces of water. This should be done at least after each meal and at bedtime.  ?  ? If you have mouth sores, avoid mouthwash that has alcohol. Also avoid alcohol and smoking because they can bother your mouth and throat.  ?  ? Taking good care of your mouth may help food taste better and improve your appetite  ?  ? Drink plenty of fluids (a minimum of eight glasses per day is recommended).  ?  ? If you throw up or have diarrhea, you should drink more fluids so that you do not become dehydrated (lack of water in the body from losing too much fluid).  ?  ? If you have diarrhea, eat low-fiber foods that are high in  protein and calories and avoid foods that can irritate your digestive tracts or lead to cramping.  ?  ? If you are not able to move your bowels, check with your doctor or nurse before you use enemas, laxatives,

## 2021-10-07 ENCOUNTER — Ambulatory Visit (INDEPENDENT_AMBULATORY_CARE_PROVIDER_SITE_OTHER): Payer: Medicare HMO | Admitting: Surgery

## 2021-10-07 ENCOUNTER — Encounter: Payer: Self-pay | Admitting: Surgery

## 2021-10-07 ENCOUNTER — Other Ambulatory Visit: Payer: Self-pay

## 2021-10-07 VITALS — BP 106/68 | HR 78 | Temp 98.5°F | Resp 14 | Ht 65.0 in | Wt 193.0 lb

## 2021-10-07 DIAGNOSIS — C61 Malignant neoplasm of prostate: Secondary | ICD-10-CM

## 2021-10-07 DIAGNOSIS — C775 Secondary and unspecified malignant neoplasm of intrapelvic lymph nodes: Secondary | ICD-10-CM

## 2021-10-07 NOTE — Progress Notes (Signed)
[facility] History and Physical ? ?Reason for Referral: Port-A-Cath insertion ?Referring Physician: Dr. Delton Coombes ? ?Chief Complaint   ?New Patient (Initial Visit) ?  ? ? ?Andrew [name] is a 76 y.o. male.  ?HPI: Patient presents for evaluation of Port-A-Cath insertion.  He was diagnosed with prostate cancer in 2009, and it was treated with gold seed.  Since that time, he has had a recurrence of his prostate cancer.  He is now requiring additional treatment.  He denies ever receiving any chemotherapy previously, and denied ever having a port placed.  He denies history of surgeries other than her his prostate seed surgery.  His past medical history significant for hypertension and diabetes.  He takes an 81 mg aspirin daily.  He smokes 1 pack/day, but denies use of alcohol products or illicit drugs. ? ?Past Medical History:  ?Diagnosis Date  ? Anemia   ? BPH (benign prostatic hyperplasia)   ? Sees Dr [name]  ? Cataract   ? Diabetes mellitus without complication ([place])   ? Hyperlipidemia   ? Hypertension   ? Low serum vitamin D   ? Prostate cancer [facility]) 2008  ? w/seed implantation and radiation  ? ? ?Past Surgical History:  ?Procedure Laterality Date  ? COLONOSCOPY  [id number]  ? PROSTATE SURGERY  2008  ? seed implant  ? ? ?Family History  ?Problem Relation Age of Onset  ? Diabetes Mother   ? Heart disease Mother   ? Kidney disease Mother   ?     DIALYSIS  ? Emphysema Father   ? Deep vein thrombosis Sister   ? Kidney disease Sister   ? Lung cancer Sister   ? Colon polyps Brother   ? Hypertension Brother   ? Gout Brother   ? Colon cancer Neg Hx   ? Esophageal cancer Neg Hx   ? Rectal cancer Neg Hx   ? Prostate cancer Neg Hx   ? ? ?Social History  ? ?Tobacco Use  ? Smoking status: Every Day  ?  Packs/day: 1.00  ?  Years: 55.00  ?  Pack years: 55.00  ?  Types: Cigarettes  ?  Start date: 06/30/1965  ? Smokeless tobacco: Never  ? Tobacco comments:  ?  Has tried nicotine replacement gum  ?Vaping Use  ? Vaping  Use: Never used  ?Substance Use Topics  ? Alcohol use: No  ? Drug use: No  ? ? ?Medications: I have reviewed the patient's current medications. ?Allergies as of 10/07/2021   ?No Known Allergies ?  ? ?  ?Medication List  ?  ? ?  ? Accurate as of October 07, 2021  2:30 PM. If you have any questions, ask your nurse or doctor.  ?  ?  ? ?  ? ?abiraterone acetate 250 MG tablet ?Commonly known as: ZYTIGA ?Take 4 tablets (1,000 mg total) by mouth daily. Take on an empty stomach 1 hour before or 2 hours after a meal ?  ?acetaminophen 500 MG tablet ?Commonly known as: TYLENOL ?Take 1,000 mg by mouth every 6 (six) hours as needed for mild pain or headache. ?  ?alfuzosin 10 MG 24 hr tablet ?Commonly known as: UROXATRAL ?Take 1 tablet (10 mg total) by mouth at bedtime. ?  ?aspirin 81 MG tablet ?Take 1 tablet (81 mg total) by mouth daily with breakfast. ?  ?atorvastatin 40 MG tablet ?Commonly known as: LIPITOR ?Take 1 tablet (40 mg total) by mouth daily. ?What changed: when to take this ?  ?  azelastine 0.1 % nasal spray ?Commonly known as: ASTELIN ?Place 1 spray into both nostrils 2 (two) times daily as needed for allergies. ?  ?cholecalciferol 1000 units tablet ?Commonly known as: VITAMIN D ?Take 1,000 Units by mouth daily. ?  ?clotrimazole-betamethasone cream ?Commonly known as: Lotrisone ?Apply 1 application topically 2 (two) times daily. ?  ?dapagliflozin propanediol 10 MG Tabs tablet ?Commonly known as: [place] ?Take 1 tablet (10 mg total) by mouth daily before breakfast. ?  ?dronabinol 5 MG capsule ?Commonly known as: MARINOL ?Take 1 capsule (5 mg total) by mouth 2 (two) times daily before a meal. ?  ?glimepiride 2 MG tablet ?Commonly known as: Amaryl ?Take 1 tablet (2 mg total) by mouth 2 (two) times daily before a meal. To be taken only while you are on prednisone/steroid medication ?  ?glucose blood test strip ?1 each by Other route as needed for other. Use as instructed to test blood sugar daily.  One Touch Verio Test  Strips DX: E11.65 ?  ?lisinopril-hydrochlorothiazide 20-25 MG tablet ?Commonly known as: ZESTORETIC ?Take 1 tablet by mouth daily. ?  ?metFORMIN 1000 MG tablet ?Commonly known as: GLUCOPHAGE ?Take 1 tablet (1,000 mg total) by mouth 2 (two) times daily with a meal. ?  ?potassium chloride SA 20 MEQ tablet ?Commonly known as: KLOR-CON M ?Take 1 tablet (20 mEq total) by mouth daily. ?  ?predniSONE 5 MG tablet ?Commonly known as: DELTASONE ?Take 1 tablet (5 mg total) by mouth 2 (two) times daily with a meal. ?  ?sulfamethoxazole-trimethoprim 800-160 MG tablet ?Commonly known as: BACTRIM DS ?Take 1 tablet by mouth 2 (two) times daily. ?What changed: additional instructions ?  ? ?  ? ? ? ?ROS:  ?Constitutional: negative for chills, fatigue, and fevers ?Eyes: positive for blurred vision, negative for pain ?Ears, nose, mouth, throat, and face: negative for ear drainage, sore throat, and sinus problems ?Respiratory: positive for cough, wheezing, and shortness of breath ?Cardiovascular: negative for chest pain and palpitations ?Gastrointestinal: negative for abdominal pain, nausea, reflux symptoms, and vomiting ?Genitourinary:positive for dysuria, frequency, and urinary retention ?Integument/breast: positive for dryness, negative for rash ?Hematologic/lymphatic: negative for bleeding and lymphadenopathy ?Musculoskeletal:positive for back pain, neck pain, and joint pain ?Neurological: negative for dizziness, tremors, and numbness ?Endocrine: negative for temperature intolerance ? ?Blood pressure 106/68, pulse 78, temperature 98.5 ?F (36.9 ?C), temperature source Oral, resp. rate 14, height '5\' 5"'$  (1.651 m), weight 193 lb (87.5 kg), SpO2 96 %. ?Physical Exam ?Vitals reviewed.  ?Constitutional:   ?   Appearance: Normal appearance.  ?HENT:  ?   Head: Normocephalic and atraumatic.  ?Eyes:  ?   Extraocular Movements: Extraocular movements intact.  ?   Pupils: Pupils are equal, round, and reactive to light.  ?Cardiovascular:  ?   Rate  and Rhythm: Normal rate and regular rhythm.  ?Pulmonary:  ?   Effort: Pulmonary effort is normal.  ?   Breath sounds: Normal breath sounds.  ?Abdominal:  ?   General: There is no distension.  ?   Palpations: Abdomen is soft.  ?   Tenderness: There is no abdominal tenderness.  ?Musculoskeletal:     ?   General: Normal range of motion.  ?   Cervical back: Normal range of motion. No rigidity.  ?Lymphadenopathy:  ?   Cervical: No cervical adenopathy.  ?Skin: ?   General: Skin is warm and dry.  ?Neurological:  ?   General: No focal deficit present.  ?   Mental Status: He is alert and  oriented to person, place, and time.  ?Psychiatric:     ?   Mood and Affect: Mood normal.     ?   Behavior: Behavior normal.  ? ? ?Results: ?No results found for this or any previous visit (from the past 48 hour(s)). ? ?No results found. ? ? ?Assessment & Plan:  ?Andrew [name] is a 76 y.o. male who presents for evaluation of Port-A-Cath insertion for prostate cancer. ? ?-I explained the procedure of right IJ Port-A-Cath insertion under ultrasound guidance.  The risks and benefits of right IJ Port-A-Cath insertion were discussed, including but not limited to bleeding, infection, injury to surrounding structures, pneumothorax, and need for additional procedures ?-Patient tentatively scheduled for right IJ Port-A-Cath insertion on 4/20 ?-Information provided regarding Port-A-Cath ? ?All questions were answered to the satisfaction of the patient. ? ? ?Nasiah Polinsky, DO ?[facility] ?[place], [place] [address] ?[phone] (office) ? ? ? ? ? ?

## 2021-10-08 ENCOUNTER — Ambulatory Visit (INDEPENDENT_AMBULATORY_CARE_PROVIDER_SITE_OTHER): Payer: Medicare HMO | Admitting: *Deleted

## 2021-10-08 DIAGNOSIS — C61 Malignant neoplasm of prostate: Secondary | ICD-10-CM

## 2021-10-08 DIAGNOSIS — I10 Essential (primary) hypertension: Secondary | ICD-10-CM

## 2021-10-08 DIAGNOSIS — E1165 Type 2 diabetes mellitus with hyperglycemia: Secondary | ICD-10-CM

## 2021-10-09 ENCOUNTER — Encounter (HOSPITAL_COMMUNITY): Payer: Self-pay | Admitting: Hematology

## 2021-10-09 ENCOUNTER — Encounter: Payer: Self-pay | Admitting: *Deleted

## 2021-10-09 ENCOUNTER — Encounter (HOSPITAL_COMMUNITY): Payer: Self-pay

## 2021-10-09 ENCOUNTER — Ambulatory Visit (HOSPITAL_BASED_OUTPATIENT_CLINIC_OR_DEPARTMENT_OTHER)
Admission: RE | Admit: 2021-10-09 | Discharge: 2021-10-09 | Disposition: A | Discharge status: Home / Self Care | Payer: Medicare HMO | Source: Ambulatory Visit | Attending: Hematology | Admitting: Hematology

## 2021-10-09 ENCOUNTER — Encounter (HOSPITAL_BASED_OUTPATIENT_CLINIC_OR_DEPARTMENT_OTHER): Payer: Self-pay

## 2021-10-09 DIAGNOSIS — C61 Malignant neoplasm of prostate: Secondary | ICD-10-CM | POA: Diagnosis not present

## 2021-10-09 DIAGNOSIS — Z95828 Presence of other vascular implants and grafts: Secondary | ICD-10-CM | POA: Insufficient documentation

## 2021-10-09 DIAGNOSIS — C775 Secondary and unspecified malignant neoplasm of intrapelvic lymph nodes: Secondary | ICD-10-CM | POA: Insufficient documentation

## 2021-10-09 DIAGNOSIS — I700 Atherosclerosis of aorta: Secondary | ICD-10-CM | POA: Diagnosis not present

## 2021-10-09 HISTORY — DX: Presence of other vascular implants and grafts: Z95.828

## 2021-10-09 MED ORDER — IOHEXOL 300 MG/ML  SOLN
100.0000 mL | Freq: Once | INTRAMUSCULAR | Status: AC | PRN
  Administered 2021-10-09: 85 mL via INTRAVENOUS

## 2021-10-09 NOTE — H&P (Signed)
[facility] History and Physical ? ?Reason for Referral: Port-A-Cath insertion ?Referring Physician: Dr. Delton Coombes ? ?Chief Complaint   ?New Patient (Initial Visit) ?  ? ? ?Andrew [name] is a 76 y.o. male.  ?HPI: Patient presents for evaluation of Port-A-Cath insertion.  He was diagnosed with prostate cancer in 2009, and it was treated with gold seed.  Since that time, he has had a recurrence of his prostate cancer.  He is now requiring additional treatment.  He denies ever receiving any chemotherapy previously, and denied ever having a port placed.  He denies history of surgeries other than her his prostate seed surgery.  His past medical history significant for hypertension and diabetes.  He takes an 81 mg aspirin daily.  He smokes 1 pack/day, but denies use of alcohol products or illicit drugs. ? ?Past Medical History:  ?Diagnosis Date  ? Anemia   ? BPH (benign prostatic hyperplasia)   ? Sees Dr [name]  ? Cataract   ? Diabetes mellitus without complication ([place])   ? Hyperlipidemia   ? Hypertension   ? Low serum vitamin D   ? Prostate cancer [facility]) 2008  ? w/seed implantation and radiation  ? ? ?Past Surgical History:  ?Procedure Laterality Date  ? COLONOSCOPY  [id number]  ? PROSTATE SURGERY  2008  ? seed implant  ? ? ?Family History  ?Problem Relation Age of Onset  ? Diabetes Mother   ? Heart disease Mother   ? Kidney disease Mother   ?     DIALYSIS  ? Emphysema Father   ? Deep vein thrombosis Sister   ? Kidney disease Sister   ? Lung cancer Sister   ? Colon polyps Brother   ? Hypertension Brother   ? Gout Brother   ? Colon cancer Neg Hx   ? Esophageal cancer Neg Hx   ? Rectal cancer Neg Hx   ? Prostate cancer Neg Hx   ? ? ?Social History  ? ?Tobacco Use  ? Smoking status: Every Day  ?  Packs/day: 1.00  ?  Years: 55.00  ?  Pack years: 55.00  ?  Types: Cigarettes  ?  Start date: 06/30/1965  ? Smokeless tobacco: Never  ? Tobacco comments:  ?  Has tried nicotine replacement gum  ?Vaping Use  ? Vaping  Use: Never used  ?Substance Use Topics  ? Alcohol use: No  ? Drug use: No  ? ? ?Medications: I have reviewed the patient's current medications. ?Allergies as of 10/07/2021   ?No Known Allergies ?  ? ?  ?Medication List  ?  ? ?  ? Accurate as of October 07, 2021  2:30 PM. If you have any questions, ask your nurse or doctor.  ?  ?  ? ?  ? ?abiraterone acetate 250 MG tablet ?Commonly known as: ZYTIGA ?Take 4 tablets (1,000 mg total) by mouth daily. Take on an empty stomach 1 hour before or 2 hours after a meal ?  ?acetaminophen 500 MG tablet ?Commonly known as: TYLENOL ?Take 1,000 mg by mouth every 6 (six) hours as needed for mild pain or headache. ?  ?alfuzosin 10 MG 24 hr tablet ?Commonly known as: UROXATRAL ?Take 1 tablet (10 mg total) by mouth at bedtime. ?  ?aspirin 81 MG tablet ?Take 1 tablet (81 mg total) by mouth daily with breakfast. ?  ?atorvastatin 40 MG tablet ?Commonly known as: LIPITOR ?Take 1 tablet (40 mg total) by mouth daily. ?What changed: when to take this ?  ?  azelastine 0.1 % nasal spray ?Commonly known as: ASTELIN ?Place 1 spray into both nostrils 2 (two) times daily as needed for allergies. ?  ?cholecalciferol 1000 units tablet ?Commonly known as: VITAMIN D ?Take 1,000 Units by mouth daily. ?  ?clotrimazole-betamethasone cream ?Commonly known as: Lotrisone ?Apply 1 application topically 2 (two) times daily. ?  ?dapagliflozin propanediol 10 MG Tabs tablet ?Commonly known as: [place] ?Take 1 tablet (10 mg total) by mouth daily before breakfast. ?  ?dronabinol 5 MG capsule ?Commonly known as: MARINOL ?Take 1 capsule (5 mg total) by mouth 2 (two) times daily before a meal. ?  ?glimepiride 2 MG tablet ?Commonly known as: Amaryl ?Take 1 tablet (2 mg total) by mouth 2 (two) times daily before a meal. To be taken only while you are on prednisone/steroid medication ?  ?glucose blood test strip ?1 each by Other route as needed for other. Use as instructed to test blood sugar daily.  One Touch Verio Test  Strips DX: E11.65 ?  ?lisinopril-hydrochlorothiazide 20-25 MG tablet ?Commonly known as: ZESTORETIC ?Take 1 tablet by mouth daily. ?  ?metFORMIN 1000 MG tablet ?Commonly known as: GLUCOPHAGE ?Take 1 tablet (1,000 mg total) by mouth 2 (two) times daily with a meal. ?  ?potassium chloride SA 20 MEQ tablet ?Commonly known as: KLOR-CON M ?Take 1 tablet (20 mEq total) by mouth daily. ?  ?predniSONE 5 MG tablet ?Commonly known as: DELTASONE ?Take 1 tablet (5 mg total) by mouth 2 (two) times daily with a meal. ?  ?sulfamethoxazole-trimethoprim 800-160 MG tablet ?Commonly known as: BACTRIM DS ?Take 1 tablet by mouth 2 (two) times daily. ?What changed: additional instructions ?  ? ?  ? ? ? ?ROS:  ?Constitutional: negative for chills, fatigue, and fevers ?Eyes: positive for blurred vision, negative for pain ?Ears, nose, mouth, throat, and face: negative for ear drainage, sore throat, and sinus problems ?Respiratory: positive for cough, wheezing, and shortness of breath ?Cardiovascular: negative for chest pain and palpitations ?Gastrointestinal: negative for abdominal pain, nausea, reflux symptoms, and vomiting ?Genitourinary:positive for dysuria, frequency, and urinary retention ?Integument/breast: positive for dryness, negative for rash ?Hematologic/lymphatic: negative for bleeding and lymphadenopathy ?Musculoskeletal:positive for back pain, neck pain, and joint pain ?Neurological: negative for dizziness, tremors, and numbness ?Endocrine: negative for temperature intolerance ? ?Blood pressure 106/68, pulse 78, temperature 98.5 ?F (36.9 ?C), temperature source Oral, resp. rate 14, height '5\' 5"'$  (1.651 m), weight 193 lb (87.5 kg), SpO2 96 %. ?Physical Exam ?Vitals reviewed.  ?Constitutional:   ?   Appearance: Normal appearance.  ?HENT:  ?   Head: Normocephalic and atraumatic.  ?Eyes:  ?   Extraocular Movements: Extraocular movements intact.  ?   Pupils: Pupils are equal, round, and reactive to light.  ?Cardiovascular:  ?   Rate  and Rhythm: Normal rate and regular rhythm.  ?Pulmonary:  ?   Effort: Pulmonary effort is normal.  ?   Breath sounds: Normal breath sounds.  ?Abdominal:  ?   General: There is no distension.  ?   Palpations: Abdomen is soft.  ?   Tenderness: There is no abdominal tenderness.  ?Musculoskeletal:     ?   General: Normal range of motion.  ?   Cervical back: Normal range of motion. No rigidity.  ?Lymphadenopathy:  ?   Cervical: No cervical adenopathy.  ?Skin: ?   General: Skin is warm and dry.  ?Neurological:  ?   General: No focal deficit present.  ?   Mental Status: He is alert and  oriented to person, place, and time.  ?Psychiatric:     ?   Mood and Affect: Mood normal.     ?   Behavior: Behavior normal.  ? ? ?Results: ?No results found for this or any previous visit (from the past 48 hour(s)). ? ?No results found. ? ? ?Assessment & Plan:  ?Andrew [name] is a 76 y.o. male who presents for evaluation of Port-A-Cath insertion for prostate cancer. ? ?-I explained the procedure of right IJ Port-A-Cath insertion under ultrasound guidance.  The risks and benefits of right IJ Port-A-Cath insertion were discussed, including but not limited to bleeding, infection, injury to surrounding structures, pneumothorax, and need for additional procedures ?-Patient tentatively scheduled for right IJ Port-A-Cath insertion on 4/20 ?-Information provided regarding Port-A-Cath ? ?All questions were answered to the satisfaction of the patient. ? ? ?Shahin Knierim, DO ?[facility] ?[place], [place] [address] ?[phone] (office) ?

## 2021-10-09 NOTE — Patient Instructions (Signed)
Visit Information ? ?  ?Patient Goals/Self-Care Activities: ?Patient will self administer medications as prescribed ?Patient will attend church or other social activities ?Patient will continue to perform ADL's independently ?Patient will continue to perform IADL's independently ?Patient will call provider office for new concerns or questions ?Patient will call RN Care Manager as needed [phone] ?Patient will keep all medical appointments ?Patient will schedule dental care ?Patient will talk with [facility] [phone] regarding home repairs ?Patient will check and record blood pressure 3 times per week and will call PCP with any readings outside of recommended range ?Patient will check and record blood sugar daily and will call PCP with any readings outside of recommended range ?Patient will utilize [place] as needed ? ?The patient verbalized understanding of instructions, educational materials, and care plan provided today and declined offer to receive copy of patient instructions, educational materials, and care plan.  ? ?Plan: ?Telephone follow up appointment with care management team member scheduled for:  10/30/21 with RNCM ?The patient has been provided with contact information for the care management team and has been advised to call with any health related questions or concerns.  ? ?Chong Sicilian, BSN, RN-BC ?Embedded Chronic Care Manager ?[place] [facility] ?Direct Dial: [phone] ?

## 2021-10-09 NOTE — Chronic Care Management (AMB) (Signed)
?Chronic Care Management  ? ?CCM RN Visit Note ? ?10/08/2021 ?Name: Andrew [name] MRN: [id number] DOB: [birth year] ? ?Subjective: ?Andrew [name] is a 76 y.o. year old male who is a primary care patient of Stacks, Cletus Gash, MD. The care management team was consulted for assistance with disease management and care coordination needs.   ? ?Engaged with patient's wife, [name], by telephone  for follow up visit in response to provider referral for case management and/or care coordination services.  ? ?Consent to Services:  ?The patient was given information about Chronic Care Management services, agreed to services, and gave verbal consent prior to initiation of services.  Please see initial visit note for detailed documentation.  ? ?Patient agreed to services and verbal consent obtained.  ? ?Assessment: Review of patient past medical history, allergies, medications, health status, including review of consultants reports, laboratory and other test data, was performed as part of comprehensive evaluation and provision of chronic care management services.  ? ?SDOH (Social Determinants of Health) assessments and interventions performed:   ? ?CCM Care Plan ? ?No Known Allergies ? ?Outpatient Encounter Medications as of 10/08/2021  ?Medication Sig Note  ? abiraterone acetate (ZYTIGA) 250 MG tablet Take 4 tablets (1,000 mg total) by mouth daily. Take on an empty stomach 1 hour before or 2 hours after a meal (Patient not taking: Reported on 10/04/2021)   ? acetaminophen (TYLENOL) 500 MG tablet Take 1,000 mg by mouth every 6 (six) hours as needed for mild pain or headache.   ? alfuzosin (UROXATRAL) 10 MG 24 hr tablet Take 1 tablet (10 mg total) by mouth at bedtime.   ? aspirin 81 MG tablet Take 1 tablet (81 mg total) by mouth daily with breakfast.   ? atorvastatin (LIPITOR) 40 MG tablet Take 1 tablet (40 mg total) by mouth daily. (Patient taking differently: Take 40 mg by mouth at bedtime.)   ? azelastine (ASTELIN) 0.1 % nasal spray Place 1  spray into both nostrils 2 (two) times daily as needed for allergies.   ? cholecalciferol (VITAMIN D) 1000 UNITS tablet Take 1,000 Units by mouth daily.    ? clotrimazole-betamethasone (LOTRISONE) cream Apply 1 application topically 2 (two) times daily. (Patient not taking: Reported on 10/04/2021)   ? dapagliflozin propanediol (FARXIGA) 10 MG TABS tablet Take 1 tablet (10 mg total) by mouth daily before breakfast.   ? dronabinol (MARINOL) 5 MG capsule Take 1 capsule (5 mg total) by mouth 2 (two) times daily before a meal. (Patient not taking: Reported on 10/04/2021)   ? glimepiride (AMARYL) 2 MG tablet Take 1 tablet (2 mg total) by mouth 2 (two) times daily before a meal. To be taken only while you are on prednisone/steroid medication   ? glucose blood test strip 1 each by Other route as needed for other. Use as instructed to test blood sugar daily.  One Touch Verio Test Strips DX: E11.65 10/17/2019: One Touch Verio  ? lisinopril-hydrochlorothiazide (ZESTORETIC) 20-25 MG tablet Take 1 tablet by mouth daily.   ? metFORMIN (GLUCOPHAGE) 1000 MG tablet Take 1 tablet (1,000 mg total) by mouth 2 (two) times daily with a meal.   ? potassium chloride SA (KLOR-CON M) 20 MEQ tablet Take 1 tablet (20 mEq total) by mouth daily. (Patient not taking: Reported on 10/04/2021)   ? predniSONE (DELTASONE) 5 MG tablet Take 1 tablet (5 mg total) by mouth 2 (two) times daily with a meal. (Patient not taking: Reported on 10/04/2021)   ? sulfamethoxazole-trimethoprim (BACTRIM  DS) 800-160 MG tablet Take 1 tablet by mouth 2 (two) times daily. (Patient taking differently: Take 1 tablet by mouth 2 (two) times daily. For 7 days) 10/04/2021: Pt unsure of how many doses left. Began therapy 4.5.23  ? ?No facility-administered encounter medications on file as of 10/08/2021.  ? ? ?Patient Active Problem List  ? Diagnosis Date Noted  ? Hyperglycemia due to diabetes mellitus ([place]) 10/04/2021  ? AKI (acute kidney injury) ([place]) 10/04/2021  ? Genetic testing  05/09/2021  ? Aortic atherosclerosis ([place]) 01/18/2021  ? Primary insomnia 07/26/2020  ? Sacroiliitis ([place]) 07/26/2020  ? Nocturia 02/08/2020  ? Nonspecific abnormal electrocardiogram (ECG) (EKG) 07/18/2019  ? Type 2 diabetes mellitus with complication, without long-term current use of insulin ([place]) 07/18/2019  ? Educated about COVID-19 virus infection 07/18/2019  ? SOB (shortness of breath) 12/10/2016  ? Erectile dysfunction due to arterial insufficiency 10/30/2016  ? Polyp of colon 10/30/2016  ? Peripheral vascular insufficiency ([place]) 06/13/2016  ? Vitamin D deficiency 08/24/2015  ? Prostate cancer metastatic to intrapelvic lymph node ([place]) 04/07/2014  ? Diabetes mellitus type 2, uncontrolled 09/14/2010  ? Benign prostatic hyperplasia with urinary obstruction 09/14/2010  ? Essential hypertension 09/14/2010  ? Hyperlipidemia associated with type 2 diabetes mellitus ([place]) 09/14/2010  ? Tobacco abuse 09/14/2010  ? ? ?Conditions to be addressed/monitored:HTN, DMII, and prostate cancer ? ?Care Plan : RNCM Care Plan  ?  ? ?Problem: Chronic Diseaes Managment Needs   ?Priority: High  ?  ? ?Long-Range Goal: Work with [place] with HTN, DM, metastatic prostate cancer, poor oral health   ?Start Date: 02/22/2021  ?Expected End Date: 02/22/2022  ?This Visit's Progress: On track  ?Recent Progress: On track  ?Priority: High  ?Note:   ?Current Barriers:  ?Chronic Disease Management support and education needs related to poor oral health in a patient with hypertension, diabetes, and metastatic prostate cancer ?Chronic disease management and care coordination needs related to HTN, DM, and metastatic prostate cancer ?Unable to independently drive ? ? ?RNCM Clinical Goal(s):  ?Patient will continue to work with RN Care Manager to address care management and care coordination needs related to HTN, DMII, and , metastatic prostate cancer, poor oral health  through collaboration with [place] manager/LCSW, provider, and care team.  ?Patient will keep all oncology follow-up appointments ?Patient will schedule and keep appointment with dentist for evaluation and treatment ? ?Interventions: ?1:1 collaboration with primary care provider regarding development and update of comprehensive plan of care as evidenced by provider attestation and co-signature ?Inter-disciplinary care team collaboration (see longitudinal plan of care) ?Evaluation of current treatment plan related to  self management and patient's adherence to plan as established by provider ? ? ?Diabetes:  (Status: Goal on Track (progressing): YES.) ? ?Lab Results  ?Component Value Date  ? HGBA1C 7.9 (H) 10/02/2021  ? HGBA1C 5.6 05/01/2021  ? HGBA1C 6.1 01/29/2021  ? ?Lab Results  ?Component Value Date  ? MICROALBUR 100 11/21/2014  ? [place] 57 10/02/2021  ? CREATININE 1.39 (H) 10/05/2021  ?Assessed patient's understanding of A1c goal: <7% ?Reviewed and discussed medications ?Counseled on importance of regular laboratory monitoring as prescribed        ?Encouraged to continue checking and recording blood sugar daily and as needed       ?Encouraged to call provider for findings outside established parameters       ?Discussed home blood sugar monitoring and average blood sugar readings ?Discussed the impact that  stress and other medical conditions can have on blood sugar ?Assessed social determinant of health barriers ? ? ?Hypertension: (Status: Condition stable. Not addressed this visit.) ?Last practice recorded BP readings:  ?BP Readings from Last 3 Encounters:  ?05/01/21 124/70  ?04/29/21 118/89  ?04/15/21 138/73  ?Reviewed medications with patient and discussed importance of medication adherence ?Encouraged patient to check and record blood pressure at least 3 times per week and to call PCP with any readings outside of recommended range ?Reinforced low sodium/DASH diet ?Discussed plans with patient for ongoing care management follow up and  provided patient with direct contact information for care management team ? ? ?Oral Health:  (Status: Goal on track: NO.) ?Reviewed and discussed medications ?Previously placed referral to [place] who

## 2021-10-10 ENCOUNTER — Encounter (HOSPITAL_COMMUNITY)
Admission: RE | Admit: 2021-10-10 | Discharge: 2021-10-10 | Disposition: A | Discharge status: Home / Self Care | Payer: Medicare HMO | Source: Ambulatory Visit | Attending: Hematology | Admitting: Hematology

## 2021-10-10 ENCOUNTER — Inpatient Hospital Stay (HOSPITAL_COMMUNITY): Payer: Medicare HMO

## 2021-10-10 ENCOUNTER — Ambulatory Visit: Payer: Medicare HMO | Admitting: Family Medicine

## 2021-10-10 DIAGNOSIS — C775 Secondary and unspecified malignant neoplasm of intrapelvic lymph nodes: Secondary | ICD-10-CM | POA: Insufficient documentation

## 2021-10-10 DIAGNOSIS — C61 Malignant neoplasm of prostate: Secondary | ICD-10-CM | POA: Diagnosis not present

## 2021-10-10 DIAGNOSIS — Z95828 Presence of other vascular implants and grafts: Secondary | ICD-10-CM

## 2021-10-10 MED ORDER — TECHNETIUM TC 99M MEDRONATE IV KIT
20.0000 | PACK | Freq: Once | INTRAVENOUS | Status: AC | PRN
  Administered 2021-10-10: 19.3 via INTRAVENOUS

## 2021-10-10 MED ORDER — LIDOCAINE-PRILOCAINE 2.5-2.5 % EX CREA: TOPICAL_CREAM | CUTANEOUS | 3 refills | Status: DC

## 2021-10-10 MED ORDER — PROCHLORPERAZINE MALEATE 10 MG PO TABS: 10.0000 mg | ORAL_TABLET | Freq: Four times a day (QID) | ORAL | 1 refills | Status: DC | PRN

## 2021-10-10 MED ORDER — PROCHLORPERAZINE MALEATE 10 MG PO TABS: 10.0000 mg | ORAL_TABLET | Freq: Four times a day (QID) | ORAL | 2 refills | Status: DC | PRN

## 2021-10-10 NOTE — Progress Notes (Signed)

## 2021-10-11 NOTE — Progress Notes (Addendum)
The following biosimilar Fulphila ([id number]) has been selected for use in this patient per insurance. ? ?Henreitta Leber, PharmD ? ?Pharmacist Chemotherapy Monitoring - Initial Assessment   ? ?Anticipated start date: 10/21/21  ? ?The following has been reviewed per standard work regarding the patient's treatment regimen: ?The patient's diagnosis, treatment plan and drug doses, and organ/hematologic function ?Lab orders and baseline tests specific to treatment regimen  ?The treatment plan start date, drug sequencing, and pre-medications ?Prior authorization status  ?Patient's documented medication list, including drug-drug interaction screen and prescriptions for anti-emetics and supportive care specific to the treatment regimen ?The drug concentrations, fluid compatibility, administration routes, and timing of the medications to be used ?The patient's access for treatment and lifetime cumulative dose history, if applicable  ?The patient's medication allergies and previous infusion related reactions, if applicable  ? ?Changes made to treatment plan:  ?treatment plan date ? ?Follow up needed:  ?N/A ? ? ?Wynona Neat, [facility], ?10/11/2021  2:55 PM ? ?

## 2021-10-11 NOTE — Patient Instructions (Signed)
? ? ? ? ? ? ? ? ? ? Andrew [name] ? 10/11/2021  ?  ? '@PREFPERIOPPHARMACY'$ @ ? ? Your procedure is scheduled on  10/17/2021. ? ? Report to [facility] at  0730  A.M. ? ? Call this number if you have problems the morning of surgery: ? [phone] ? ? Remember: ? Do not eat or drink after midnight. ?  ?  ? Take these medicines the morning of surgery with A SIP OF WATER  ? ?                               compazine(if needed). ?  ? ? Do not wear jewelry, make-up or nail polish. ? Do not wear lotions, powders, or perfumes, or deodorant. ? Do not shave 48 hours prior to surgery.  Men may shave face and neck. ? Do not bring valuables to the hospital. ? [facility] is not responsible for any belongings or valuables. ? ?Contacts, dentures or bridgework may not be worn into surgery.  Leave your suitcase in the car.  After surgery it may be brought to your room. ? ?For patients admitted to the hospital, discharge time will be determined by your treatment team. ? ?Patients discharged the day of surgery will not be allowed to drive home and must have someone with them for 24 hours.  ? ? ?Special instructions:   DO NOT smoke tobacco or vape for 24 hours before your procedure. ? ?Please read over the following fact sheets that you were given. ?Coughing and Deep Breathing, Surgical Site Infection Prevention, Anesthesia Post-op Instructions, and Care and Recovery After Surgery ?  ? ? ? Implanted [facility] Guide ?An implanted port is a device that is placed under the skin. It is usually placed in the chest. The device may vary based on the need. Implanted ports can be used to give IV medicine, to take blood, or to give fluids. You may have an implanted port if: ?You need IV medicine that would be irritating to the small veins in your hands or arms. ?You need IV medicines, such as chemotherapy, for a long period of time. ?You need IV nutrition for a long period of time. ?You may have fewer limitations when using a port than you would if  you used other types of long-term IVs. You will also likely be able to return to normal activities after your incision heals. ?An implanted port has two main parts: ?Reservoir. The reservoir is the part where a needle is inserted to give medicines or draw blood. The reservoir is round. After the port is placed, it appears as a small, raised area under your skin. ?Catheter. The catheter is a small, thin tube that connects the reservoir to a vein. Medicine that is inserted into the reservoir goes into the catheter and then into the vein. ?How is my port accessed? ?To access your port: ?A numbing cream may be placed on the skin over the port site. ?Your health care provider will put on a mask and sterile gloves. ?The skin over your port will be cleaned carefully with a germ-killing soap and allowed to dry. ?Your health care provider will gently pinch the port and insert a needle into it. ?Your health care provider will check for a blood return to make sure the port is in the vein and is still working (patent). ?If your port needs to remain accessed to get medicine continuously (  constant infusion), your health care provider will place a clear bandage (dressing) over the needle site. The dressing and needle will need to be changed every week, or as told by your health care provider. ?What is flushing? ?Flushing helps keep the port working. Follow instructions from your health care provider about how and when to flush the port. Ports are usually flushed with saline solution or a medicine called heparin. The need for flushing will depend on how the port is used: ?If the port is only used from time to time to give medicines or draw blood, the port may need to be flushed: ?Before and after medicines have been given. ?Before and after blood has been drawn. ?As part of routine maintenance. Flushing may be recommended every 4-6 weeks. ?If a constant infusion is running, the port may not need to be flushed. ?Throw away any  syringes in a disposal container that is meant for sharp items (sharps container). You can buy a sharps container from a pharmacy, or you can make one by using an empty hard plastic bottle with a cover. ?How long will my port stay implanted? ?The port can stay in for as long as your health care provider thinks it is needed. When it is time for the port to come out, a surgery will be done to remove it. The surgery will be similar to the procedure that was done to put the port in. ?Follow these instructions at home: ?Caring for your port and port site ?Flush your port as told by your health care provider. ?If you need an infusion over several days, follow instructions from your health care provider about how to take care of your port site. Make sure you: ?Change your dressing as told by your health care provider. ?Wash your hands with soap and water for at least 20 seconds before and after you change your dressing. If soap and water are not available, use alcohol-based hand sanitizer. ?Place any used dressings or infusion bags into a plastic bag. Throw that bag in the trash. ?Keep the dressing that covers the needle clean and dry. Do not get it wet. ?Do not use scissors or sharp objects near the infusion tubing. ?Keep any external tubes clamped, unless they are being used. ?Check your port site every day for signs of infection. Check for: ?Redness, swelling, or pain. ?Fluid or blood. ?Warmth. ?Pus or a bad smell. ?Protect the skin around the port site. ?Avoid wearing bra straps that rub or irritate the site. ?Protect the skin around your port from seat belts. Place a soft pad over your chest if needed. ?Bathe or shower as told by your health care provider. The site may get wet as long as you are not actively receiving an infusion. ?General instructions ? ?Return to your normal activities as told by your health care provider. Ask your health care provider what activities are safe for you. ?Carry a medical alert card or  wear a medical alert bracelet at all times. This will let health care providers know that you have an implanted port in case of an emergency. ?Where to find more information ?American Cancer Society: www.cancer.org ?American Society of Clinical Oncology: www.cancer.net ?Contact a health care provider if: ?You have a fever or chills. ?You have redness, swelling, or pain at the port site. ?You have fluid or blood coming from your port site. ?Your incision feels warm to the touch. ?You have pus or a bad smell coming from the port site. ?Summary ?Implanted ports  are usually placed in the chest for long-term IV access. ?Follow instructions from your health care provider about flushing the port and changing bandages (dressings). ?Take care of the area around your port by avoiding clothing that puts pressure on the area, and by watching for signs of infection. ?Protect the skin around your port from seat belts. Place a soft pad over your chest if needed. ?Contact a health care provider if you have a fever or you have redness, swelling, pain, fluid, or a bad smell at the port site. ?This information is not intended to replace advice given to you by your health care provider. Make sure you discuss any questions you have with your health care provider. ?Document Revised: 12/18/2020 Document Reviewed: 12/18/2020 ?Elsevier Patient Education ? Experiment Insertion, Care After ?The following information offers guidance on how to care for yourself after your procedure. Your health care provider may also give you more specific instructions. If you have problems or questions, contact your health care provider. ?What can I expect after the procedure? ?After the procedure, it is common to have: ?Discomfort at the port insertion site. ?Bruising on the skin over the port. This should improve over 3-4 days. ?Follow these instructions at home: ?Port care ?After your port is placed, you will get a manufacturer's  information card. The card has information about your port. Keep this card with you at all times. ?Take care of the port as told by your health care provider. Ask your health care provider if you or a family m

## 2021-10-14 ENCOUNTER — Ambulatory Visit (HOSPITAL_COMMUNITY): Payer: Medicare HMO

## 2021-10-14 ENCOUNTER — Other Ambulatory Visit (HOSPITAL_COMMUNITY): Payer: Medicare HMO

## 2021-10-14 ENCOUNTER — Other Ambulatory Visit (HOSPITAL_COMMUNITY): Payer: Self-pay

## 2021-10-14 ENCOUNTER — Ambulatory Visit (HOSPITAL_COMMUNITY): Payer: Medicare HMO | Admitting: Hematology

## 2021-10-15 ENCOUNTER — Encounter (HOSPITAL_COMMUNITY)
Admission: RE | Admit: 2021-10-15 | Discharge: 2021-10-15 | Disposition: A | Discharge status: Home / Self Care | Payer: Medicare HMO | Source: Ambulatory Visit | Attending: Surgery | Admitting: Surgery

## 2021-10-15 ENCOUNTER — Other Ambulatory Visit: Payer: Self-pay

## 2021-10-15 ENCOUNTER — Encounter (HOSPITAL_COMMUNITY): Payer: Self-pay

## 2021-10-16 ENCOUNTER — Ambulatory Visit (HOSPITAL_COMMUNITY): Payer: Medicare HMO

## 2021-10-17 ENCOUNTER — Encounter (HOSPITAL_COMMUNITY): Payer: Self-pay | Admitting: Surgery

## 2021-10-17 ENCOUNTER — Ambulatory Visit (HOSPITAL_COMMUNITY): Payer: Medicare HMO

## 2021-10-17 ENCOUNTER — Other Ambulatory Visit: Payer: Self-pay

## 2021-10-17 ENCOUNTER — Ambulatory Visit (HOSPITAL_COMMUNITY)
Admission: RE | Admit: 2021-10-17 | Discharge: 2021-10-17 | Disposition: A | Discharge status: Home / Self Care | Payer: Medicare HMO | Attending: Surgery | Admitting: Surgery

## 2021-10-17 ENCOUNTER — Encounter (HOSPITAL_COMMUNITY)
Admission: RE | Disposition: A | Discharge status: Home / Self Care | Payer: Self-pay | Source: Home / Self Care | Attending: Surgery

## 2021-10-17 ENCOUNTER — Ambulatory Visit (HOSPITAL_BASED_OUTPATIENT_CLINIC_OR_DEPARTMENT_OTHER): Payer: Medicare HMO | Admitting: Certified Registered"

## 2021-10-17 ENCOUNTER — Ambulatory Visit (HOSPITAL_COMMUNITY): Payer: Medicare HMO | Admitting: Certified Registered"

## 2021-10-17 DIAGNOSIS — F17210 Nicotine dependence, cigarettes, uncomplicated: Secondary | ICD-10-CM

## 2021-10-17 DIAGNOSIS — I10 Essential (primary) hypertension: Secondary | ICD-10-CM | POA: Diagnosis not present

## 2021-10-17 DIAGNOSIS — R69 Illness, unspecified: Secondary | ICD-10-CM | POA: Diagnosis not present

## 2021-10-17 DIAGNOSIS — Z452 Encounter for adjustment and management of vascular access device: Secondary | ICD-10-CM | POA: Diagnosis not present

## 2021-10-17 DIAGNOSIS — Z7984 Long term (current) use of oral hypoglycemic drugs: Secondary | ICD-10-CM | POA: Diagnosis not present

## 2021-10-17 DIAGNOSIS — D649 Anemia, unspecified: Secondary | ICD-10-CM | POA: Diagnosis not present

## 2021-10-17 DIAGNOSIS — D759 Disease of blood and blood-forming organs, unspecified: Secondary | ICD-10-CM | POA: Diagnosis not present

## 2021-10-17 DIAGNOSIS — E1151 Type 2 diabetes mellitus with diabetic peripheral angiopathy without gangrene: Secondary | ICD-10-CM | POA: Diagnosis not present

## 2021-10-17 DIAGNOSIS — C61 Malignant neoplasm of prostate: Secondary | ICD-10-CM | POA: Diagnosis not present

## 2021-10-17 DIAGNOSIS — D630 Anemia in neoplastic disease: Secondary | ICD-10-CM | POA: Diagnosis not present

## 2021-10-17 HISTORY — PX: PORTACATH PLACEMENT: SHX2246

## 2021-10-17 LAB — GLUCOSE, CAPILLARY
Glucose-Capillary: 159 mg/dL — ABNORMAL HIGH (ref 70–99)
Glucose-Capillary: 158 mg/dL — ABNORMAL HIGH (ref 70–99)

## 2021-10-17 SURGERY — INSERTION, TUNNELED CENTRAL VENOUS DEVICE, WITH PORT
Anesthesia: General | Site: Chest | Laterality: Right

## 2021-10-17 MED ORDER — CEFAZOLIN SODIUM-DEXTROSE 2-4 GM/100ML-% IV SOLN
INTRAVENOUS | Status: AC
  Filled 2021-10-17: qty 100

## 2021-10-17 MED ORDER — ONDANSETRON HCL 4 MG/2ML IJ SOLN
INTRAMUSCULAR | Status: DC | PRN
Start: 2021-10-17 — End: 2021-10-17
  Administered 2021-10-17: 4 mg via INTRAVENOUS

## 2021-10-17 MED ORDER — CEFAZOLIN SODIUM-DEXTROSE 2-4 GM/100ML-% IV SOLN
2.0000 g | INTRAVENOUS | Status: AC
  Administered 2021-10-17: 2 g via INTRAVENOUS

## 2021-10-17 MED ORDER — PROPOFOL 500 MG/50ML IV EMUL
INTRAVENOUS | Status: AC
  Filled 2021-10-17: qty 50

## 2021-10-17 MED ORDER — LACTATED RINGERS IV SOLN: INTRAVENOUS | Status: DC

## 2021-10-17 MED ORDER — ONDANSETRON HCL 4 MG/2ML IJ SOLN: 4.0000 mg | Freq: Once | INTRAMUSCULAR | Status: DC | PRN

## 2021-10-17 MED ORDER — DOCUSATE SODIUM 100 MG PO CAPS: 100.0000 mg | ORAL_CAPSULE | Freq: Two times a day (BID) | ORAL | 2 refills | Status: DC

## 2021-10-17 MED ORDER — SODIUM CHLORIDE (PF) 0.9 % IJ SOLN
INTRAMUSCULAR | Status: DC | PRN
  Administered 2021-10-17: 500 mL via INTRAVENOUS

## 2021-10-17 MED ORDER — LIDOCAINE 2% (20 MG/ML) 5 ML SYRINGE
INTRAMUSCULAR | Status: DC | PRN
  Administered 2021-10-17: 50 mg via INTRAVENOUS

## 2021-10-17 MED ORDER — LIDOCAINE HCL (PF) 1 % IJ SOLN
INTRAMUSCULAR | Status: AC
  Filled 2021-10-17: qty 30

## 2021-10-17 MED ORDER — ONDANSETRON HCL 4 MG/2ML IJ SOLN
INTRAMUSCULAR | Status: AC
  Filled 2021-10-17: qty 2

## 2021-10-17 MED ORDER — CHLORHEXIDINE GLUCONATE CLOTH 2 % EX PADS: 6.0000 | MEDICATED_PAD | Freq: Once | CUTANEOUS | Status: DC

## 2021-10-17 MED ORDER — FENTANYL CITRATE PF 50 MCG/ML IJ SOSY: 25.0000 ug | PREFILLED_SYRINGE | INTRAMUSCULAR | Status: DC | PRN

## 2021-10-17 MED ORDER — PHENYLEPHRINE 80 MCG/ML (10ML) SYRINGE FOR IV PUSH (FOR BLOOD PRESSURE SUPPORT)
PREFILLED_SYRINGE | INTRAVENOUS | Status: AC
  Filled 2021-10-17: qty 10

## 2021-10-17 MED ORDER — PROPOFOL 500 MG/50ML IV EMUL
INTRAVENOUS | Status: DC | PRN
Start: 2021-10-17 — End: 2021-10-17
  Administered 2021-10-17: 50 ug/kg/min via INTRAVENOUS

## 2021-10-17 MED ORDER — HEPARIN SOD (PORK) LOCK FLUSH 100 UNIT/ML IV SOLN
INTRAVENOUS | Status: AC
  Filled 2021-10-17: qty 5

## 2021-10-17 MED ORDER — FENTANYL CITRATE (PF) 100 MCG/2ML IJ SOLN
INTRAMUSCULAR | Status: DC | PRN
  Administered 2021-10-17 (×4): 25 ug via INTRAVENOUS

## 2021-10-17 MED ORDER — PHENYLEPHRINE 80 MCG/ML (10ML) SYRINGE FOR IV PUSH (FOR BLOOD PRESSURE SUPPORT)
PREFILLED_SYRINGE | INTRAVENOUS | Status: DC | PRN
  Administered 2021-10-17 (×2): 80 ug via INTRAVENOUS
  Administered 2021-10-17: 160 ug via INTRAVENOUS

## 2021-10-17 MED ORDER — PROPOFOL 10 MG/ML IV BOLUS
INTRAVENOUS | Status: DC | PRN
  Administered 2021-10-17 (×3): 20 mg via INTRAVENOUS

## 2021-10-17 MED ORDER — ACETAMINOPHEN 500 MG PO TABS: 1000.0000 mg | ORAL_TABLET | Freq: Four times a day (QID) | ORAL | 0 refills | Status: AC

## 2021-10-17 MED ORDER — ORAL CARE MOUTH RINSE: 15.0000 mL | Freq: Once | OROMUCOSAL | Status: DC

## 2021-10-17 MED ORDER — LIDOCAINE HCL (PF) 1 % IJ SOLN
INTRAMUSCULAR | Status: DC | PRN
Start: 2021-10-17 — End: 2021-10-17
  Administered 2021-10-17: 14 mL

## 2021-10-17 MED ORDER — ASPIRIN 81 MG PO TABS: 81.0000 mg | ORAL_TABLET | Freq: Every day | ORAL | 3 refills | Status: DC

## 2021-10-17 MED ORDER — PROPOFOL 10 MG/ML IV BOLUS
INTRAVENOUS | Status: AC
  Filled 2021-10-17: qty 20

## 2021-10-17 MED ORDER — FENTANYL CITRATE (PF) 100 MCG/2ML IJ SOLN
INTRAMUSCULAR | Status: AC
  Filled 2021-10-17: qty 2

## 2021-10-17 MED ORDER — CHLORHEXIDINE GLUCONATE 0.12 % MT SOLN: 15.0000 mL | Freq: Once | OROMUCOSAL | Status: DC

## 2021-10-17 MED ORDER — OXYCODONE HCL 5 MG PO TABS: 5.0000 mg | ORAL_TABLET | Freq: Three times a day (TID) | ORAL | 0 refills | Status: DC | PRN

## 2021-10-17 MED ORDER — LIDOCAINE HCL (PF) 2 % IJ SOLN
INTRAMUSCULAR | Status: AC
  Filled 2021-10-17: qty 5

## 2021-10-17 MED ORDER — HEPARIN SOD (PORK) LOCK FLUSH 100 UNIT/ML IV SOLN
INTRAVENOUS | Status: DC | PRN
  Administered 2021-10-17: 300 [IU] via INTRAVENOUS

## 2021-10-17 SURGICAL SUPPLY — 35 items
ADH SKN CLS APL DERMABOND .7 (GAUZE/BANDAGES/DRESSINGS) ×1
APL PRP STRL LF ISPRP CHG 10.5 (MISCELLANEOUS) ×1
APPLICATOR CHLORAPREP 10.5 ORG (MISCELLANEOUS) ×2 IMPLANT
BAG DECANTER FOR FLEXI CONT (MISCELLANEOUS) ×2 IMPLANT
CLOTH BEACON ORANGE TIMEOUT ST (SAFETY) ×2 IMPLANT
COVER LIGHT HANDLE STERIS (MISCELLANEOUS) ×4 IMPLANT
COVER PROBE U/S 5X48 (MISCELLANEOUS) ×2 IMPLANT
DECANTER SPIKE VIAL GLASS SM (MISCELLANEOUS) ×2 IMPLANT
DERMABOND ADVANCED (GAUZE/BANDAGES/DRESSINGS) ×1
DERMABOND ADVANCED .7 DNX12 (GAUZE/BANDAGES/DRESSINGS) ×1 IMPLANT
DRAPE C-ARM FOLDED MOBILE STRL (DRAPES) ×2 IMPLANT
ELECT REM PT RETURN 9FT ADLT (ELECTROSURGICAL) ×2
ELECTRODE REM PT RTRN 9FT ADLT (ELECTROSURGICAL) ×1 IMPLANT
GLOVE BIOGEL PI IND STRL 6.5 (GLOVE) ×1 IMPLANT
GLOVE BIOGEL PI IND STRL 7.0 (GLOVE) ×2 IMPLANT
GLOVE BIOGEL PI INDICATOR 6.5 (GLOVE) ×1
GLOVE BIOGEL PI INDICATOR 7.0 (GLOVE) ×2
GLOVE ECLIPSE 6.5 STRL STRAW (GLOVE) ×1 IMPLANT
GLOVE SURG SS PI 6.5 STRL IVOR (GLOVE) ×2 IMPLANT
GOWN STRL REUS W/TWL LRG LVL3 (GOWN DISPOSABLE) ×4 IMPLANT
IV NS 500ML (IV SOLUTION) ×2
IV NS 500ML BAXH (IV SOLUTION) ×1 IMPLANT
KIT PORT POWER 8FR ISP MRI (Port) ×2 IMPLANT
KIT TURNOVER KIT A (KITS) ×2 IMPLANT
MANIFOLD NEPTUNE II (INSTRUMENTS) ×2 IMPLANT
NDL HYPO 25X1 1.5 SAFETY (NEEDLE) ×1 IMPLANT
NEEDLE HYPO 25X1 1.5 SAFETY (NEEDLE) ×2 IMPLANT
PACK MINOR (CUSTOM PROCEDURE TRAY) ×2 IMPLANT
PAD ARMBOARD 7.5X6 YLW CONV (MISCELLANEOUS) ×2 IMPLANT
SET BASIN LINEN APH (SET/KITS/TRAYS/PACK) ×2 IMPLANT
SUT MNCRL AB 4-0 PS2 18 (SUTURE) ×2 IMPLANT
SUT VIC AB 3-0 SH 27 (SUTURE) ×2
SUT VIC AB 3-0 SH 27X BRD (SUTURE) ×1 IMPLANT
SYR 10ML LL (SYRINGE) ×4 IMPLANT
SYR CONTROL 10ML LL (SYRINGE) ×2 IMPLANT

## 2021-10-17 NOTE — Interval H&P Note (Signed)
History and Physical Interval Note: ? ?10/17/2021 ?8:37 AM ? ?EAN GETTEL  has presented today for surgery, with the diagnosis of Prostate cancer ?Intrapelvic lymph node mets.  The various methods of treatment have been discussed with the patient and family. After consideration of risks, benefits and other options for treatment, the patient has consented to  Procedure(s) with comments: ?INSERTION PORT-A-CATH, R IJ (Right) - pt needs to have glucose checked AM of surgery as a surgical intervention.  The patient's history has been reviewed, patient examined, no change in status, stable for surgery.  I have reviewed the patient's chart and labs.  Questions were answered to the patient's satisfaction.   ? ? ?Joyelle Siedlecki A Carinne Brandenburger ? ? ?

## 2021-10-17 NOTE — Discharge Instructions (Signed)
Ambulatory Surgery Discharge Instructions  General Anesthesia or Sedation Do not drive or operate heavy machinery for 24 hours.  Do not consume alcohol, tranquilizers, sleeping medications, or any non-prescribed medications for 24 hours. Do not make important decisions or sign any important papers in the next 24 hours. You should have someone with you tonight at home.  Activity  You are advised to go directly home from the hospital.  Restrict your activities and rest for a day.  Resume light activity tomorrow. No heavy lifting over 10 lbs or strenuous exercise.  Fluids and Diet Begin with clear liquids, bouillon, dry toast, soda crackers.  If not nauseated, you may go to a regular diet when you desire.  Greasy and spicy foods are not advised.  Medications  If you have not had a bowel movement in 24 hours, take 2 tablespoons over the counter Milk of mag.             You May resume your blood thinners tomorrow (Aspirin, coumadin, or other).  You are being discharged with prescriptions for Opioid/Narcotic Medications: There are some specific considerations for these medications that you should know. Opioid Meds have risks & benefits. Addiction to these meds is always a concern with prolonged use Take medication only as directed Do not drive while taking narcotic pain medication Do not crush tablets or capsules Do not use a different container than medication was dispensed in Lock the container of medication in a cool, dry place out of reach of children and pets. Opioid medication can cause addiction Do not share with anyone else (this is a felony) Do not store medications for future use. Dispose of them properly.     Disposal:  Find a [facility] household drug take back site near you.  If you can't get to a drug take back site, use the recipe below as a last resort to dispose of expired, unused or unwanted drugs. Disposal  (Do not dispose chemotherapy drugs this way, talk to your  prescribing doctor instead.) Step 1: Mix drugs (do not crush) with dirt, kitty litter, or used coffee grounds and add a small amount of water to dissolve any solid medications. Step 2: Seal drugs in plastic bag. Step 3: Place plastic bag in trash. Step 4: Take prescription container and scratch out personal information, then recycle or throw away.  Operative Site  You have a liquid bandage over your incisions, this will begin to flake off in about a week. Ok to shower tomorrow. Keep wound clean and dry. No baths or swimming. No lifting more than 10 pounds.  Contact Information: If you have questions or concerns, please call our office, [phone], Monday- Thursday 8AM-5PM and Friday 8AM-12Noon.  If it is after hours or on the weekend, please call [facility] Main Number, [phone], and ask to speak to the surgeon on call for Dr. Harrison Zetina at [facility].   SPECIFIC COMPLICATIONS TO WATCH FOR: Inability to urinate Fever over 101? F by mouth Nausea and vomiting lasting longer than 24 hours. Pain not relieved by medication ordered Swelling around the operative site Increased redness, warmth, hardness, around operative area Numbness, tingling, or cold fingers or toes Blood -soaked dressing, (small amounts of oozing may be normal) Increasing and progressive drainage from surgical area or exam site  

## 2021-10-17 NOTE — Progress Notes (Signed)
Update note: ? ?Spoke with the patient's wife and daughter in the consultation room.  I explained that the patient tolerated right IJ Port-A-Cath insertion without issue.  We will obtain a chest x-ray in PACU.  He has dissolvable stitches under the skin with overlying Dermabond.  The Dermabond will flake off in about 10 to 14 days.  I will be sending him home with some narcotic pain medications.  He should take scheduled Tylenol for the next couple of days, and take the narcotic pain medication for spikes in pain.  I advised him to take a stool softener if he is taking the narcotic pain medication.  All questions were answered to their expressed satisfaction. ? ?Graciella Freer, DO ?[facility] ?[place], [place] [address] ?[phone] (office) ? ?

## 2021-10-17 NOTE — Anesthesia Preprocedure Evaluation (Addendum)
Anesthesia Evaluation  ?Patient identified by MRN, date of birth, ID band ?Patient awake ? ? ? ?Reviewed: ?Allergy & Precautions, H&P , NPO status , Patient's Chart, lab work & pertinent test results, reviewed documented beta blocker date and time  ? ?Airway ?Mallampati: II ? ?TM Distance: >3 FB ?Neck ROM: full ? ? ? Dental ?no notable dental hx. ?(+) Missing, Loose,  ?  ?Pulmonary ?neg pulmonary ROS, Current Smoker,  ?  ?Pulmonary exam normal ?breath sounds clear to auscultation ? ? ? ? ? ? Cardiovascular ?Exercise Tolerance: Good ?hypertension, + Peripheral Vascular Disease  ? ?Rhythm:regular Rate:Normal ? ? ?  ?Neuro/Psych ?negative neurological ROS ? negative psych ROS  ? GI/Hepatic ?negative GI ROS, Neg liver ROS,   ?Endo/Other  ?negative endocrine ROSdiabetes, Type 2 ? Renal/GU ?negative Renal ROS  ?negative genitourinary ?  ?Musculoskeletal ?negative musculoskeletal ROS ?(+)  ? Abdominal ?  ?Peds ?negative pediatric ROS ?(+)  Hematology ? ?(+) Blood dyscrasia, anemia ,   ?Anesthesia Other Findings ? ? Reproductive/Obstetrics ?negative OB ROS ? ?  ? ? ? ? ? ? ? ? ? ? ? ? ? ?  ?  ? ? ? ? ? ? ?Anesthesia Physical ?Anesthesia Plan ? ?ASA: 3 ? ?Anesthesia Plan: General  ? ?Post-op Pain Management:   ? ?Induction:  ? ?PONV Risk Score and Plan: Propofol infusion ? ?Airway Management Planned:  ? ?Additional Equipment:  ? ?Intra-op Plan:  ? ?Post-operative Plan:  ? ?Informed Consent: I have reviewed the patients History and Physical, chart, labs and discussed the procedure including the risks, benefits and alternatives for the proposed anesthesia with the patient or authorized representative who has indicated his/her understanding and acceptance.  ? ? ? ?Dental Advisory Given ? ?Plan Discussed with: CRNA ? ?Anesthesia Plan Comments:   ? ? ? ? ? ? ?Anesthesia Quick Evaluation ? ?

## 2021-10-17 NOTE — Anesthesia Postprocedure Evaluation (Signed)
Anesthesia Post Note ? ?Patient: Andrew [name] ? ?Procedure(s) Performed: INSERTION PORT-A-CATH, R IJ (Right: Chest) ? ?Patient location during evaluation: Phase II ?Anesthesia Type: General ?Level of consciousness: awake ?Pain management: pain level controlled ?Vital Signs Assessment: post-procedure vital signs reviewed and stable ?Respiratory status: spontaneous breathing and respiratory function stable ?Cardiovascular status: blood pressure returned to baseline and stable ?Postop Assessment: no headache and no apparent nausea or vomiting ?Anesthetic complications: no ?Comments: Late entry ? ? ?No notable events documented. ? ? ?Last Vitals:  ?Vitals:  ? 10/17/21 1000 10/17/21 1015  ?BP: (!) 131/58 (!) 142/65  ?Pulse: (!) 56 65  ?Resp: 14 14  ?Temp:  36.4 ?C  ?SpO2: 100% 100%  ?  ?Last Pain:  ?Vitals:  ? 10/17/21 1015  ?TempSrc: Oral  ?PainSc: 0-No pain  ? ? ?  ?  ?  ?  ?  ?  ? ?Louann Sjogren ? ? ? ? ?

## 2021-10-17 NOTE — Transfer of Care (Signed)
Immediate Anesthesia Transfer of Care Note ? ?Patient: Andrew [name] ? ?Procedure(s) Performed: INSERTION PORT-A-CATH, R IJ (Right: Chest) ? ?Patient Location: PACU ? ?Anesthesia Type:General ? ?Level of Consciousness: awake, alert  and oriented ? ?Airway & Oxygen Therapy: Patient Spontanous Breathing ? ?Post-op Assessment: Report given to RN, Post -op Vital signs reviewed and stable and Patient moving all extremities X 4 ? ?Post vital signs: Reviewed and stable ? ?Last Vitals:  ?Vitals Value Taken Time  ?BP    ?Temp    ?Pulse 66 10/17/21 0935  ?Resp 15 10/17/21 0935  ?SpO2 100 % 10/17/21 0935  ?Vitals shown include unvalidated device data. ? ?Last Pain:  ?Vitals:  ? 10/17/21 0822  ?TempSrc: Oral  ?PainSc: 0-No pain  ?   ? ?Patients Stated Pain Goal: 3 (10/17/21 1747) ? ?Complications: No notable events documented. ?

## 2021-10-17 NOTE — Op Note (Signed)
Operative Note ?10/17/21 ?  ?Preoperative Diagnosis: Prostate Cancer ?  ?Postoperative Diagnosis: Same ?  ?Procedure(s) Performed: Right IJ Port-A-Cath placement under ultrasound guidance ?  ?Surgeon: Graciella Freer, DO  ?  ?Assistants: No qualified resident was available ?  ?Anesthesia: Monitored anesthesia care ?  ?Anesthesiologist: Dr. Briant Cedar ?  ?Specimens: None ?  ?Estimated Blood Loss: Minimal  ? ?Fluoroscopy time: 36 seconds ?  ?Blood Replacement: None  ?  ?Complications: None  ?  ?Operative Findings: Appropriately positioned right IJ Port-A-Cath ? ?Indications: Patient is a 76 year old male who was recently diagnosed with recurrent prostate cancer.  He is here for Port-A-Cath insertion.  He is agreeable to the procedure. ? ?All risks, benefits, and alternatives to right IJ Port-A-Cath insertion were discussed with the patient, all of his questions were answered to his expressed satisfaction. The patient expresses he wishes to proceed, and informed consent was obtained. ? ?Procedure: The patient was brought into the operating room and anesthesia was induced.  One percent lidocaine was used for local anesthesia.   The right chest and neck was prepped and draped in the usual sterile fashion.  Preoperative antibiotics were given.  Using ultrasound guidance, the skin overlying the right IJ was anesthetized.  Again using ultrasound guidance, the access needle was advanced into the right internal jugular vein using the Seldinger technique without difficulty. A guidewire was then advanced into the right atrium under fluoroscopic guidance.  The guidewire was also verified to be within the right IJ with ultrasound.  Ectopia was not noted. The skin was knicked.  An incision was made below the right clavicle.  A subcutaneous pocket was formed. The catheter was then tunneled from the pocket to the access site in the neck.  An introducer and peel-away sheath were placed over the guidewire. The catheter was then  inserted through the peel-away sheath and the peel-away sheath was removed.  A spot film was performed to confirm the position. The catheter was then attached to the port and the port placed in subcutaneous pocket. Adequate positioning was confirmed by fluoroscopy. Hemostasis was confirmed.  Good backflow of blood was noted on aspiration of the port. The port was flushed with heparin flush. Subcutaneous layer was reapproximated using a 3-0 Vicryl interrupted suture. The skin was closed using a 4-0 Vicryl subcuticular suture. Dermabond was applied. ?   ?All tape and needle counts were correct at the end of the procedure. The patient was transferred to PACU in stable condition. A chest x-ray will be performed at that time. ? ?Graciella Freer, DO  ?[facility] ?[place], [place] [id number] ?[phone] (office) ? ? ? ? ?

## 2021-10-17 NOTE — Anesthesia Procedure Notes (Signed)
Date/Time: 10/17/2021 9:01 AM ?Performed by: Orlie Dakin, CRNA ?Pre-anesthesia Checklist: Patient identified, Emergency Drugs available, Suction available and Patient being monitored ?Patient Re-evaluated:Patient Re-evaluated prior to induction ?Oxygen Delivery Method: Non-rebreather mask ?Induction Type: IV induction ?Placement Confirmation: positive ETCO2 ? ? ? ? ?

## 2021-10-18 ENCOUNTER — Other Ambulatory Visit (HOSPITAL_COMMUNITY): Payer: Self-pay

## 2021-10-18 ENCOUNTER — Encounter (HOSPITAL_COMMUNITY): Payer: Self-pay | Admitting: Surgery

## 2021-10-21 ENCOUNTER — Inpatient Hospital Stay (HOSPITAL_COMMUNITY): Payer: Medicare HMO

## 2021-10-21 ENCOUNTER — Inpatient Hospital Stay (HOSPITAL_BASED_OUTPATIENT_CLINIC_OR_DEPARTMENT_OTHER): Payer: Medicare HMO | Admitting: Hematology

## 2021-10-21 ENCOUNTER — Encounter (HOSPITAL_COMMUNITY): Payer: Self-pay | Admitting: Hematology

## 2021-10-21 VITALS — BP 130/71 | HR 63 | Temp 96.4°F | Resp 18

## 2021-10-21 DIAGNOSIS — C61 Malignant neoplasm of prostate: Secondary | ICD-10-CM

## 2021-10-21 DIAGNOSIS — R630 Anorexia: Secondary | ICD-10-CM | POA: Diagnosis not present

## 2021-10-21 DIAGNOSIS — C775 Secondary and unspecified malignant neoplasm of intrapelvic lymph nodes: Secondary | ICD-10-CM | POA: Diagnosis not present

## 2021-10-21 DIAGNOSIS — Z923 Personal history of irradiation: Secondary | ICD-10-CM | POA: Diagnosis not present

## 2021-10-21 DIAGNOSIS — Z5111 Encounter for antineoplastic chemotherapy: Secondary | ICD-10-CM | POA: Diagnosis not present

## 2021-10-21 DIAGNOSIS — R69 Illness, unspecified: Secondary | ICD-10-CM | POA: Diagnosis not present

## 2021-10-21 DIAGNOSIS — C7951 Secondary malignant neoplasm of bone: Secondary | ICD-10-CM | POA: Diagnosis not present

## 2021-10-21 DIAGNOSIS — Z95828 Presence of other vascular implants and grafts: Secondary | ICD-10-CM

## 2021-10-21 DIAGNOSIS — Z5189 Encounter for other specified aftercare: Secondary | ICD-10-CM | POA: Diagnosis not present

## 2021-10-21 DIAGNOSIS — Z79899 Other long term (current) drug therapy: Secondary | ICD-10-CM | POA: Diagnosis not present

## 2021-10-21 LAB — COMPREHENSIVE METABOLIC PANEL
ALT: 19 U/L (ref 0–44)
AST: 27 U/L (ref 15–41)
Albumin: 3.3 g/dL — ABNORMAL LOW (ref 3.5–5.0)
Alkaline Phosphatase: 65 U/L (ref 38–126)
Anion gap: 7 (ref 5–15)
BUN: 15 mg/dL (ref 8–23)
CO2: 27 mmol/L (ref 22–32)
Calcium: 8.8 mg/dL — ABNORMAL LOW (ref 8.9–10.3)
Chloride: 106 mmol/L (ref 98–111)
Creatinine, Ser: 1.11 mg/dL (ref 0.61–1.24)
GFR, Estimated: >60 mL/min (ref >60)
Glucose, Bld: 170 mg/dL — ABNORMAL HIGH (ref 70–99)
Potassium: 3.3 mmol/L — ABNORMAL LOW (ref 3.5–5.1)
Sodium: 140 mmol/L (ref 135–145)
Total Bilirubin: 0.8 mg/dL (ref 0.3–1.2)
Total Protein: 6.7 g/dL (ref 6.5–8.1)

## 2021-10-21 LAB — CBC WITH DIFFERENTIAL/PLATELET
Abs Immature Granulocytes: 0.02 10*3/uL (ref 0.00–0.07)
Basophils Absolute: 0.1 10*3/uL (ref 0.0–0.1)
Basophils Relative: 1 %
Eosinophils Absolute: 0.2 10*3/uL (ref 0.0–0.5)
Eosinophils Relative: 4 %
HCT: 32.1 % — ABNORMAL LOW (ref 39.0–52.0)
Hemoglobin: 10.8 g/dL — ABNORMAL LOW (ref 13.0–17.0)
Immature Granulocytes: 0 %
Lymphocytes Relative: 33 %
Lymphs Abs: 1.9 10*3/uL (ref 0.7–4.0)
MCH: 31.4 pg (ref 26.0–34.0)
MCHC: 33.6 g/dL (ref 30.0–36.0)
MCV: 93.3 fL (ref 80.0–100.0)
Monocytes Absolute: 0.4 10*3/uL (ref 0.1–1.0)
Monocytes Relative: 7 %
Neutro Abs: 3.2 10*3/uL (ref 1.7–7.7)
Neutrophils Relative %: 55 %
Platelets: 199 10*3/uL (ref 150–400)
RBC: 3.44 MIL/uL — ABNORMAL LOW (ref 4.22–5.81)
RDW: 13.2 % (ref 11.5–15.5)
WBC: 5.8 10*3/uL (ref 4.0–10.5)
nRBC: 0 % (ref 0.0–0.2)

## 2021-10-21 LAB — IRON AND TIBC
Iron: 64 ug/dL (ref 45–182)
Saturation Ratios: 22 % (ref 17.9–39.5)
TIBC: 296 ug/dL (ref 250–450)
UIBC: 232 ug/dL

## 2021-10-21 LAB — MAGNESIUM: Magnesium: 1.5 mg/dL — ABNORMAL LOW (ref 1.7–2.4)

## 2021-10-21 LAB — VITAMIN B12: Vitamin B-12: 303 pg/mL (ref 180–914)

## 2021-10-21 LAB — FERRITIN: Ferritin: 62 ng/mL (ref 24–336)

## 2021-10-21 LAB — PSA: Prostatic Specific Antigen: 5.87 ng/mL — ABNORMAL HIGH (ref 0.00–4.00)

## 2021-10-21 LAB — FOLATE: Folate: 15 ng/mL (ref >5.9)

## 2021-10-21 MED ORDER — SODIUM CHLORIDE 0.9% FLUSH
10.0000 mL | INTRAVENOUS | Status: DC | PRN
  Administered 2021-10-21: 10 mL

## 2021-10-21 MED ORDER — PALONOSETRON HCL INJECTION 0.25 MG/5ML
0.2500 mg | Freq: Once | INTRAVENOUS | Status: AC
  Administered 2021-10-21: 0.25 mg via INTRAVENOUS
  Filled 2021-10-21: qty 5

## 2021-10-21 MED ORDER — SODIUM CHLORIDE 0.9 % IV SOLN
10.0000 mg | Freq: Once | INTRAVENOUS | Status: AC
  Administered 2021-10-21: 10 mg via INTRAVENOUS
  Filled 2021-10-21: qty 10

## 2021-10-21 MED ORDER — MAGNESIUM SULFATE 2 GM/50ML IV SOLN
2.0000 g | Freq: Once | INTRAVENOUS | Status: AC
  Administered 2021-10-21: 2 g via INTRAVENOUS
  Filled 2021-10-21: qty 50

## 2021-10-21 MED ORDER — HEPARIN SOD (PORK) LOCK FLUSH 100 UNIT/ML IV SOLN
500.0000 [IU] | Freq: Once | INTRAVENOUS | Status: AC | PRN
  Administered 2021-10-21: 500 [IU]

## 2021-10-21 MED ORDER — SODIUM CHLORIDE 0.9 % IV SOLN
75.0000 mg/m2 | Freq: Once | INTRAVENOUS | Status: AC
  Administered 2021-10-21: 150 mg via INTRAVENOUS
  Filled 2021-10-21: qty 15

## 2021-10-21 MED ORDER — POTASSIUM CHLORIDE CRYS ER 20 MEQ PO TBCR
40.0000 meq | EXTENDED_RELEASE_TABLET | Freq: Once | ORAL | Status: AC
  Administered 2021-10-21: 40 meq via ORAL
  Filled 2021-10-21: qty 2

## 2021-10-21 MED ORDER — SODIUM CHLORIDE 0.9 % IV SOLN: Freq: Once | INTRAVENOUS | Status: AC

## 2021-10-21 NOTE — Patient Instructions (Addendum)
[place] at [facility] ?Discharge Instructions ? ?You were seen and examined today by Dr. Delton Coombes. ? ?Dr. Delton Coombes discussed your most recent lab work and CT scan which revealed an increase in size of the lymph node impacted by cancer. This was expected based on your rising PSA and is why a treatment change is necessary. ? ?You were also slightly anemic, Dr. Raliegh Ip will order additional lab work today to further assess this. ? ?Follow-up as scheduled in one week for labs and a check-in. Your next treatment will be in 3 weeks. ? ? ? ?Thank you for choosing [place] at [facility] to provide your oncology and hematology care.  To afford each patient quality time with our provider, please arrive at least 15 minutes before your scheduled appointment time.  ? ?If you have a lab appointment with the [facility] please come in thru the Main Entrance and check in at the main information desk. ? ?You need to re-schedule your appointment should you arrive 10 or more minutes late.  We strive to give you quality time with our providers, and arriving late affects you and other patients whose appointments are after yours.  Also, if you no show three or more times for appointments you may be dismissed from the clinic at the providers discretion.     ?Again, thank you for choosing [facility].  Our hope is that these requests will decrease the amount of time that you wait before being seen by our physicians.       ?_____________________________________________________________ ? ?Should you have questions after your visit to [facility], please contact our office at [phone] and follow the prompts.  Our office hours are 8:00 a.m. and 4:30 p.m. Monday - Friday.  Please note that voicemails left after 4:00 p.m. may not be returned until the following business day.  We are closed weekends and major holidays.  You do have access to a nurse 24-7, just  call the main number to the clinic [phone] and do not press any options, hold on the line and a nurse will answer the phone.   ? ?For prescription refill requests, have your pharmacy contact our office and allow 72 hours.   ? ?Due to Covid, you will need to wear a mask upon entering the hospital. If you do not have a mask, a mask will be given to you at the Main Entrance upon arrival. For doctor visits, patients may have 1 support person age 75 or older with them. For treatment visits, patients can not have anyone with them due to social distancing guidelines and our immunocompromised population.  ? ? ? ?

## 2021-10-21 NOTE — Progress Notes (Signed)
Patient has been assessed, vital signs and labs have been reviewed by Dr. Delton Coombes. ANC, Creatinine, LFTs, and Platelets are within treatment parameters per Dr. Delton Coombes. Patient to receive 76mq K-Dur po and 2g Mag IV during visit today per Dr. KDelton Coombes The patient is good to proceed with treatment at this time. Primary RN and pharmacy aware.  ?

## 2021-10-21 NOTE — Patient Instructions (Signed)
[facility]  Discharge Instructions: Thank you for choosing [facility] to provide your oncology and hematology care.  If you have a lab appointment with the Cancer Center, please come in thru the Main Entrance and check in at the main information desk.  Wear comfortable clothing and clothing appropriate for easy access to any Portacath or PICC line.   We strive to give you quality time with your provider. You may need to reschedule your appointment if you arrive late (15 or more minutes).  Arriving late affects you and other patients whose appointments are after yours.  Also, if you miss three or more appointments without notifying the office, you may be dismissed from the clinic at the provider's discretion.      For prescription refill requests, have your pharmacy contact our office and allow 72 hours for refills to be completed.    Today you received the following chemotherapy and/or immunotherapy agents Docetaxel   To help prevent nausea and vomiting after your treatment, we encourage you to take your nausea medication as directed.  BELOW ARE SYMPTOMS THAT SHOULD BE REPORTED IMMEDIATELY: *FEVER GREATER THAN 100.4 F (38 C) OR HIGHER *CHILLS OR SWEATING *NAUSEA AND VOMITING THAT IS NOT CONTROLLED WITH YOUR NAUSEA MEDICATION *UNUSUAL SHORTNESS OF BREATH *UNUSUAL BRUISING OR BLEEDING *URINARY PROBLEMS (pain or burning when urinating, or frequent urination) *BOWEL PROBLEMS (unusual diarrhea, constipation, pain near the anus) TENDERNESS IN MOUTH AND THROAT WITH OR WITHOUT PRESENCE OF ULCERS (sore throat, sores in mouth, or a toothache) UNUSUAL RASH, SWELLING OR PAIN  UNUSUAL VAGINAL DISCHARGE OR ITCHING   Items with * indicate a potential emergency and should be followed up as soon as possible or go to the Emergency Department if any problems should occur.  Please show the CHEMOTHERAPY ALERT CARD or IMMUNOTHERAPY ALERT CARD at check-in to the Emergency  Department and triage nurse.  Should you have questions after your visit or need to cancel or reschedule your appointment, please contact [facility] [phone]  and follow the prompts.  Office hours are 8:00 a.m. to 4:30 p.m. Monday - Friday. Please note that voicemails left after 4:00 p.m. may not be returned until the following business day.  We are closed weekends and major holidays. You have access to a nurse at all times for urgent questions. Please call the main number to the clinic [phone] and follow the prompts.  For any non-urgent questions, you may also contact your provider using MyChart. We now offer e-Visits for anyone 18 and older to request care online for non-urgent symptoms. For details visit [email or web address].   Also download the MyChart app! Go to the app store, search "MyChart", open the app, select , and log in with your MyChart username and password.  Due to Covid, a mask is required upon entering the hospital/clinic. If you do not have a mask, one will be given to you upon arrival. For doctor visits, patients may have 1 support person aged 18 or older with them. For treatment visits, patients cannot have anyone with them due to current Covid guidelines and our immunocompromised population.  

## 2021-10-21 NOTE — Progress Notes (Signed)
Pharmacist Chemotherapy Monitoring - Initial Assessment   ? ?Anticipated start date: 10/21/21  ? ?The following has been reviewed per standard work regarding the patient's treatment regimen: ?The patient's diagnosis, treatment plan and drug doses, and organ/hematologic function ?Lab orders and baseline tests specific to treatment regimen  ?The treatment plan start date, drug sequencing, and pre-medications ?Prior authorization status  ?Patient's documented medication list, including drug-drug interaction screen and prescriptions for anti-emetics and supportive care specific to the treatment regimen ?The drug concentrations, fluid compatibility, administration routes, and timing of the medications to be used ?The patient's access for treatment and lifetime cumulative dose history, if applicable  ?The patient's medication allergies and previous infusion related reactions, if applicable  ? ?Changes made to treatment plan:  ?N/A ? ?Follow up needed:  ?Change Udenyca to Fulphila per insurance requirements. ? ?The following biosimilar Fulphila ([id number]) has been selected for use in this patient.  ? ? ?Wynona Neat, [facility], ?10/21/2021  9:29 AM ? ?

## 2021-10-21 NOTE — Progress Notes (Signed)
Pt presents today for D1C1 Docetaxel per provider's order. Vital signs and other labs WNL for treatment today.Potassium 3.3 and magnesium 1.5 today. Dr.K made aware, provider stated to give potassium chloride 69mq p.o x 1 dose and 2g IV of magnesium sulfate  Okay to proceed with treatment today per Dr.K.  ? ?D1C1 Docetaxel, potassium chloride 40 mEq p.o x 1 dose, and magnesium sulfate 2g IV given today per MD orders. Tolerated infusion without adverse affects. Vital signs stable. No complaints at this time. Discharged from clinic ambulatory in stable condition. Alert and oriented x 3. [facility]   ?

## 2021-10-21 NOTE — Progress Notes (Signed)
? ?[facility] ?[address] ?[place], [place] [address] ? ? ?CLINIC:  ?Medical Oncology/Hematology ? ?PCP:  ?Andrew Fraise, MD ?[address] [place] [address] ?[phone] ? ? 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 ?PRIOR THERAPY: neoadjuvant hormonal therapy, EBRT followed by brachytherapy with iodine 125 seeds in 2010 ? ?NGS Results: not done ? ?CURRENT THERAPY: Lupron injection (started 08/10/2019) and enzalutamide 160 mg daily (started 04/17/2020) ? ?BRIEF ONCOLOGIC HISTORY:  ?Oncology History  ?Prostate cancer ([place])  ?04/07/2014 Initial Diagnosis  ? Prostate cancer metastatic to intrapelvic lymph node ([place]) ?  ? Genetic Testing  ? [employer] Multi-Cancer Panel was Negative. Report date is 05/07/2021. ? ?The Multi-Cancer + RNA Panel offered by [employer] includes sequencing and/or deletion/duplication analysis of the following 84 genes:  AIP*, ALK, APC*, ATM*, AXIN2*, BAP1*, BARD1*, BLM*, BMPR1A*, BRCA1*, BRCA2*, BRIP1*, CASR, CDC73*, CDH1*, CDK4, CDKN1B*, CDKN1C*, CDKN2A, CEBPA, CHEK2*, CTNNA1*, DICER1*, DIS3L2*, EGFR, EPCAM, FH*, FLCN*, GATA2*, GPC3, GREM1, HOXB13, HRAS, KIT, MAX*, MEN1*, MET, MITF, MLH1*, MSH2*, MSH3*, MSH6*, MUTYH*, NBN*, NF1*, NF2*, NTHL1*, PALB2*, PDGFRA, PHOX2B, PMS2*, POLD1*, POLE*, POT1*, PRKAR1A*, PTCH1*, PTEN*, RAD50*, RAD51C*, RAD51D*, RB1*, RECQL4, RET, RUNX1*, SDHA*, SDHAF2*, SDHB*, SDHC*, SDHD*, SMAD4*, SMARCA4*, SMARCB1*, SMARCE1*, STK11*, SUFU*, TERC, TERT, TMEM127*, Tp53*, TSC1*, TSC2*, VHL*, WRN*, and WT1.  RNA analysis is performed for * genes. ?  ?10/21/2021 -  Chemotherapy  ? Patient is on Treatment Plan : PROSTATE Docetaxel + Prednisone q21d  ? ?  ?  ? ? ?CANCER STAGING: ? Cancer Staging  ?Prostate cancer ([place]) ?Staging form: Prostate, [name] 7th Edition ?- Clinical stage from 04/02/2021: rT2b, N1, PSA: 10 to 19, Gleason 7 - Unsigned ? ? ?INTERVAL HISTORY:  ?[name], a 76 y.o. male,  returns for routine follow-up and consideration for next cycle of chemotherapy. Andrew [name] was last seen on 10/01/2021. ? ?Due for cycle #1 of Taxotere today.  ? ?Overall, he tells me he has been feeling pretty well. He denies tingling/numbness in his hands and feet, and he denies swelling in ankles or feet. He is not currently taking Marinol. He denies current bleeding or black stools.  ? ?Overall, he feels ready for next cycle of chemo today.  ? ?REVIEW OF SYSTEMS:  ?Review of Systems  ?Constitutional:  Negative for appetite change and fatigue.  ?HENT:   Negative for nosebleeds.   ?Respiratory:  Negative for hemoptysis.   ?Cardiovascular:  Negative for leg swelling.  ?Gastrointestinal:  Negative for blood in stool.  ?Genitourinary:  Negative for hematuria.   ?Neurological:  Negative for numbness.  ?All other systems reviewed and are negative. ? ?PAST MEDICAL/SURGICAL HISTORY:  ?Past Medical History:  ?Diagnosis Date  ? Anemia   ? BPH (benign prostatic hyperplasia)   ? Sees Dr Andrew [name]  ? Cataract   ? Diabetes mellitus without complication ([place])   ? Hyperlipidemia   ? Hypertension   ? Low serum vitamin D   ? Port-A-Cath in place 10/09/2021  ? Prostate cancer [facility]) 2008  ? w/seed implantation and radiation  ? ?Past Surgical History:  ?Procedure Laterality Date  ? COLONOSCOPY  [id number]  ? PORTACATH PLACEMENT Right 10/17/2021  ? Procedure: INSERTION PORT-A-CATH, R IJ;  Surgeon: Andrew Aus, DO;  Location: [name];  Service: General;  Laterality: Right;  pt needs to have glucose checked AM of surgery  ? PROSTATE SURGERY  2008  ? seed implant  ? ? ?SOCIAL HISTORY:  ?Social  History  ? ?Socioeconomic History  ? Marital status: Married  ?  Spouse name: Andrew [name]  ? Number of children: 2  ? Years of education: 42  ? Highest education level: 12th grade  ?Occupational History  ? Occupation: [employer]  ?  Comment: Retired Geologist, engineering  ?Tobacco Use  ? Smoking status: Every Day  ?  Packs/day: 1.00  ?  Years: 55.00  ?  Pack  years: 55.00  ?  Types: Cigarettes  ?  Start date: 06/30/1965  ? Smokeless tobacco: Never  ? Tobacco comments:  ?  Has tried nicotine replacement gum  ?Vaping Use  ? Vaping Use: Never used  ?Substance and Sexual Activity  ? Alcohol use: No  ? Drug use: No  ? Sexual activity: Yes  ?Other Topics Concern  ? Not on file  ?Social History Narrative  ? Retired, lives at home with wife Andrew [name], two grown children, enjoys gardening    ? ?Social Determinants of Health  ? ?Financial Resource Strain: Low Risk   ? Difficulty of Paying Living Expenses: Not hard at all  ?Food Insecurity: No Food Insecurity  ? Worried About Charity fundraiser in the Last Year: Never true  ? Ran Out of Food in the Last Year: Never true  ?Transportation Needs: No Transportation Needs  ? Lack of Transportation (Medical): No  ? Lack of Transportation (Non-Medical): No  ?Physical Activity: Inactive  ? Days of Exercise per Week: 0 days  ? Minutes of Exercise per Session: 0 min  ?Stress: No Stress Concern Present  ? Feeling of Stress : Not at all  ?Social Connections: Socially Integrated  ? Frequency of Communication with Friends and Family: More than three times a week  ? Frequency of Social Gatherings with Friends and Family: More than three times a week  ? Attends Religious Services: More than 4 times per year  ? Active Member of Clubs or Organizations: Yes  ? Attends Archivist Meetings: More than 4 times per year  ? Marital Status: Married  ?Intimate Partner Violence: Not At Risk  ? Fear of Current or Ex-Partner: No  ? Emotionally Abused: No  ? Physically Abused: No  ? Sexually Abused: No  ? ? ?FAMILY HISTORY:  ?Family History  ?Problem Relation Age of Onset  ? Diabetes Mother   ? Heart disease Mother   ? [name] disease Mother   ?     DIALYSIS  ? Emphysema Father   ? Deep vein thrombosis Sister   ? [name] disease Sister   ? Lung cancer Sister   ? Colon polyps Brother   ? Hypertension Brother   ? Gout Brother   ? Colon cancer Neg Hx   ?  Esophageal cancer Neg Hx   ? Rectal cancer Neg Hx   ? Prostate cancer Neg Hx   ? ? ?CURRENT MEDICATIONS:  ?Current Outpatient Medications  ?Medication Sig Dispense Refill  ? alfuzosin (UROXATRAL) 10 MG 24 hr tablet Take 1 tablet (10 mg total) by mouth at bedtime. 90 tablet 3  ? aspirin 81 MG tablet Take 1 tablet (81 mg total) by mouth daily with breakfast. 30 tablet 3  ? atorvastatin (LIPITOR) 40 MG tablet Take 1 tablet (40 mg total) by mouth daily. (Patient taking differently: Take 40 mg by mouth at bedtime.) 90 tablet 3  ? azelastine (ASTELIN) 0.1 % nasal spray Place 1 spray into both nostrils 2 (two) times daily as needed for allergies.    ? [place] 5-2.5-18.5  LF-MCG/0.5 injection     ? cholecalciferol (VITAMIN D) 1000 UNITS tablet Take 1,000 Units by mouth daily.     ? dapagliflozin propanediol (FARXIGA) 10 MG TABS tablet Take 1 tablet (10 mg total) by mouth daily before breakfast. 30 tablet 2  ? DOCEtaxel (TAXOTERE IV) Inject into the vein every 21 ( twenty-one) days.    ? docusate sodium (COLACE) 100 MG capsule Take 1 capsule (100 mg total) by mouth 2 (two) times daily. 60 capsule 2  ? glimepiride (AMARYL) 2 MG tablet Take 1 tablet (2 mg total) by mouth 2 (two) times daily before a meal. To be taken only while you are on prednisone/steroid medication 60 tablet 11  ? glucose blood test strip 1 each by Other route as needed for other. Use as instructed to test blood sugar daily.  One Touch Verio Test Strips DX: E11.65 100 each 11  ? lidocaine-prilocaine (EMLA) cream Apply a small amount to port a cath site and cover with plastic wrap 1 hour prior to chemotherapy appointments 30 g 3  ? lisinopril-hydrochlorothiazide (ZESTORETIC) 20-25 MG tablet Take 1 tablet by mouth daily. 90 tablet 3  ? metFORMIN (GLUCOPHAGE) 1000 MG tablet Take 1 tablet (1,000 mg total) by mouth 2 (two) times daily with a meal. 180 tablet 2  ? oxyCODONE (ROXICODONE) 5 MG immediate release tablet Take 1 tablet (5 mg total) by mouth every 8  (eight) hours as needed. 10 tablet 0  ? potassium chloride SA (KLOR-CON M) 20 MEQ tablet Take 1 tablet (20 mEq total) by mouth daily. 30 tablet 6  ? prochlorperazine (COMPAZINE) 10 MG tablet Take 1 tablet (10 mg tot

## 2021-10-22 ENCOUNTER — Telehealth (HOSPITAL_COMMUNITY): Payer: Self-pay | Admitting: *Deleted

## 2021-10-22 NOTE — Telephone Encounter (Signed)
Called pt for 24- hour chemotherapy follow up. Pt stated he felt good. Pt denies shortness of breath, headaches, diarrhea, and vomiting. Pt advised to call the clinic if needed.  ?

## 2021-10-23 ENCOUNTER — Inpatient Hospital Stay (HOSPITAL_COMMUNITY): Payer: Medicare HMO

## 2021-10-23 VITALS — BP 117/60 | HR 102 | Temp 98.2°F | Resp 18

## 2021-10-23 DIAGNOSIS — C61 Malignant neoplasm of prostate: Secondary | ICD-10-CM | POA: Diagnosis not present

## 2021-10-23 DIAGNOSIS — Z5189 Encounter for other specified aftercare: Secondary | ICD-10-CM | POA: Diagnosis not present

## 2021-10-23 DIAGNOSIS — R630 Anorexia: Secondary | ICD-10-CM | POA: Diagnosis not present

## 2021-10-23 DIAGNOSIS — R69 Illness, unspecified: Secondary | ICD-10-CM | POA: Diagnosis not present

## 2021-10-23 DIAGNOSIS — C7951 Secondary malignant neoplasm of bone: Secondary | ICD-10-CM | POA: Diagnosis not present

## 2021-10-23 DIAGNOSIS — Z923 Personal history of irradiation: Secondary | ICD-10-CM | POA: Diagnosis not present

## 2021-10-23 DIAGNOSIS — C775 Secondary and unspecified malignant neoplasm of intrapelvic lymph nodes: Secondary | ICD-10-CM | POA: Diagnosis not present

## 2021-10-23 DIAGNOSIS — Z5111 Encounter for antineoplastic chemotherapy: Secondary | ICD-10-CM | POA: Diagnosis not present

## 2021-10-23 DIAGNOSIS — Z79899 Other long term (current) drug therapy: Secondary | ICD-10-CM | POA: Diagnosis not present

## 2021-10-23 DIAGNOSIS — Z95828 Presence of other vascular implants and grafts: Secondary | ICD-10-CM

## 2021-10-23 MED ORDER — PEGFILGRASTIM-JMDB 6 MG/0.6ML ~~LOC~~ SOSY
6.0000 mg | PREFILLED_SYRINGE | Freq: Once | SUBCUTANEOUS | Status: AC
  Administered 2021-10-23: 6 mg via SUBCUTANEOUS
  Filled 2021-10-23: qty 0.6

## 2021-10-23 NOTE — Patient Instructions (Signed)
[facility]  Discharge Instructions: Thank you for choosing [facility] to provide your oncology and hematology care.  If you have a lab appointment with the Cancer Center, please come in thru the Main Entrance and check in at the main information desk.  Wear comfortable clothing and clothing appropriate for easy access to any Portacath or PICC line.   We strive to give you quality time with your provider. You may need to reschedule your appointment if you arrive late (15 or more minutes).  Arriving late affects you and other patients whose appointments are after yours.  Also, if you miss three or more appointments without notifying the office, you may be dismissed from the clinic at the provider's discretion.      For prescription refill requests, have your pharmacy contact our office and allow 72 hours for refills to be completed.        To help prevent nausea and vomiting after your treatment, we encourage you to take your nausea medication as directed.  BELOW ARE SYMPTOMS THAT SHOULD BE REPORTED IMMEDIATELY: *FEVER GREATER THAN 100.4 F (38 C) OR HIGHER *CHILLS OR SWEATING *NAUSEA AND VOMITING THAT IS NOT CONTROLLED WITH YOUR NAUSEA MEDICATION *UNUSUAL SHORTNESS OF BREATH *UNUSUAL BRUISING OR BLEEDING *URINARY PROBLEMS (pain or burning when urinating, or frequent urination) *BOWEL PROBLEMS (unusual diarrhea, constipation, pain near the anus) TENDERNESS IN MOUTH AND THROAT WITH OR WITHOUT PRESENCE OF ULCERS (sore throat, sores in mouth, or a toothache) UNUSUAL RASH, SWELLING OR PAIN  UNUSUAL VAGINAL DISCHARGE OR ITCHING   Items with * indicate a potential emergency and should be followed up as soon as possible or go to the Emergency Department if any problems should occur.  Please show the CHEMOTHERAPY ALERT CARD or IMMUNOTHERAPY ALERT CARD at check-in to the Emergency Department and triage nurse.  Should you have questions after your visit or need to cancel  or reschedule your appointment, please contact [facility] [phone]  and follow the prompts.  Office hours are 8:00 a.m. to 4:30 p.m. Monday - Friday. Please note that voicemails left after 4:00 p.m. may not be returned until the following business day.  We are closed weekends and major holidays. You have access to a nurse at all times for urgent questions. Please call the main number to the clinic [phone] and follow the prompts.  For any non-urgent questions, you may also contact your provider using MyChart. We now offer e-Visits for anyone 18 and older to request care online for non-urgent symptoms. For details visit [email or web address].   Also download the MyChart app! Go to the app store, search "MyChart", open the app, select [facility], and log in with your MyChart username and password.  Due to Covid, a mask is required upon entering the hospital/clinic. If you do not have a mask, one will be given to you upon arrival. For doctor visits, patients may have 1 support person aged 18 or older with them. For treatment visits, patients cannot have anyone with them due to current Covid guidelines and our immunocompromised population.  

## 2021-10-23 NOTE — Progress Notes (Signed)
Patient presents today for Fulphila injection.  Patient is in satisfactory condition with no complaints voiced.  Vital signs are stable.  We will proceed with injection per MD orders.  ? ?Patient tolerated injection with no complaints voiced.  Site clean and dry with no bruising or swelling noted.  No complaints of pain.  Discharged with vital signs stable and no signs or symptoms of distress noted.   ? ?

## 2021-10-27 DIAGNOSIS — E1165 Type 2 diabetes mellitus with hyperglycemia: Secondary | ICD-10-CM | POA: Diagnosis not present

## 2021-10-27 DIAGNOSIS — C61 Malignant neoplasm of prostate: Secondary | ICD-10-CM

## 2021-10-27 DIAGNOSIS — C775 Secondary and unspecified malignant neoplasm of intrapelvic lymph nodes: Secondary | ICD-10-CM | POA: Diagnosis not present

## 2021-10-27 DIAGNOSIS — I10 Essential (primary) hypertension: Secondary | ICD-10-CM | POA: Diagnosis not present

## 2021-10-28 ENCOUNTER — Encounter (HOSPITAL_COMMUNITY): Payer: Self-pay | Admitting: Hematology

## 2021-10-29 ENCOUNTER — Ambulatory Visit (INDEPENDENT_AMBULATORY_CARE_PROVIDER_SITE_OTHER): Payer: Medicare HMO | Admitting: Family Medicine

## 2021-10-29 ENCOUNTER — Encounter (HOSPITAL_COMMUNITY): Payer: Self-pay | Admitting: Hematology

## 2021-10-29 ENCOUNTER — Encounter: Payer: Self-pay | Admitting: Family Medicine

## 2021-10-29 ENCOUNTER — Ambulatory Visit (INDEPENDENT_AMBULATORY_CARE_PROVIDER_SITE_OTHER): Payer: Medicare HMO | Admitting: Pharmacist

## 2021-10-29 ENCOUNTER — Encounter: Payer: Self-pay | Admitting: Pharmacist

## 2021-10-29 ENCOUNTER — Ambulatory Visit (INDEPENDENT_AMBULATORY_CARE_PROVIDER_SITE_OTHER): Payer: Medicare HMO | Admitting: Surgery

## 2021-10-29 VITALS — BP 120/85

## 2021-10-29 VITALS — BP 96/51 | HR 101 | Temp 97.5°F | Ht 65.75 in | Wt 194.0 lb

## 2021-10-29 DIAGNOSIS — R69 Illness, unspecified: Secondary | ICD-10-CM | POA: Diagnosis not present

## 2021-10-29 DIAGNOSIS — F321 Major depressive disorder, single episode, moderate: Secondary | ICD-10-CM

## 2021-10-29 DIAGNOSIS — C61 Malignant neoplasm of prostate: Secondary | ICD-10-CM | POA: Diagnosis not present

## 2021-10-29 DIAGNOSIS — E1165 Type 2 diabetes mellitus with hyperglycemia: Secondary | ICD-10-CM | POA: Diagnosis not present

## 2021-10-29 DIAGNOSIS — Z09 Encounter for follow-up examination after completed treatment for conditions other than malignant neoplasm: Secondary | ICD-10-CM

## 2021-10-29 DIAGNOSIS — E118 Type 2 diabetes mellitus with unspecified complications: Secondary | ICD-10-CM

## 2021-10-29 DIAGNOSIS — E1169 Type 2 diabetes mellitus with other specified complication: Secondary | ICD-10-CM

## 2021-10-29 MED ORDER — ESCITALOPRAM OXALATE 10 MG PO TABS
10.0000 mg | ORAL_TABLET | Freq: Every day | ORAL | 1 refills | Status: DC
Start: 2021-10-29 — End: 2022-08-19

## 2021-10-29 MED ORDER — QTERN 10-5 MG PO TABS: 1.0000 | ORAL_TABLET | Freq: Every morning | ORAL | 5 refills | Status: DC

## 2021-10-29 NOTE — Progress Notes (Signed)
[facility] ? ?I am calling the patient for post operative evaluation. This is not a billable encounter as it is under the [place] charges for the surgery.  The patient had a right IJ Port-A-Cath placement on 4/20. The patient reports that he has been doing well since the surgery.  He believes he has undergone 1 infusion since the surgery, and they had no difficulty at that time.  He denies any pain associated with his incision sites.  He is having some midline posterior cervical pain, but thinks this is secondary to his arthritis.  He still has some skin glue on at his incision sites.  I advised him that he can use antibiotic ointment to help dissolve the skin glue.  He has no other questions or concerns at this time.  ? ?Will see the patient PRN.  ? ?Graciella Freer, DO ?[facility] ?[place], [place] [address] ?[phone] (office) ? ?

## 2021-10-29 NOTE — Patient Instructions (Addendum)
Visit Information ? ?Following are the goals we discussed today:  ?Current Barriers:  ?Unable to independently afford treatment regimen ?Unable to achieve control of T2DM, HLD  ?Suboptimal therapeutic regimen for T2DM, HLD ? ?Pharmacist Clinical Goal(s):  ?patient will verbalize ability to afford treatment regimen ?maintain control of T2DM, HLD as evidenced by GOAL A1C, IMPROVED GLYCEMIC CONTROL  through collaboration with PharmD and provider.  ? ?Interventions: ?1:1 collaboration with [name], MD regarding development and update of comprehensive plan of care as evidenced by provider attestation and co-signature ?Inter-disciplinary care team collaboration (see longitudinal plan of care) ?Comprehensive medication review performed; medication list updated in electronic medical record ? ?Diabetes: New goal. ?Uncontrolled-RECENT HOSPITALIZATION FOR HYPERGLYCEMIA, A1C NOW INCREASING -->7.9%, BLOOD SUGAR HAS IMPROVED OVER THE PAST 2-3 WEEKS ?CURRENTLY UNDERGOING CHEMOTHERAPY FOR PROSTATE CANCER ?Current treatment:METFORMIN, FARXIGA SAMPLES-->tradjenta/farxiga samples-->QTERN;  ?Will transition patient to QTERN (DPP4/SGLT2) combo given higher blood sugars on current regimen (qtern is available through patient assistance program) ?Continue metformin GFR >60 ?Detailed instructions given to patient  ?New BG meter given--Contour Next ONE ?Education and set up provided ?Sent RX via parachute to [facility] for patient to get free glucometer ?Current glucose readings: fasting glucose: UP TO 200s, MOSTLY <190, post prandial glucose: 200S ?Denies hypoglycemic/hyperglycemic symptoms ?Discussed meal planning options and Plate method for healthy eating ?Avoid sugary drinks and desserts ?Incorporate balanced protein, non starchy veggies, 1 serving of carbohydrate with each meal ?Increase water intake ?Increase physical activity as able ?Current exercise: UNABLE DUE TO CHEMO ?Educated on new medication regimen ?Assessed  patient finances. [place] in [place] patient [place] for QTERN ? ? ?Patient Goals/Self-Care Activities ?patient will:  ?- take medications as prescribed as evidenced by patient report and record review ?check glucose DAILY OR IF TO, document, and provide at future appointments ?collaborate with provider on medication access solutions ?target a minimum of 150 minutes of moderate intensity exercise weekly ?engage in dietary modifications by FOLLOWING A HEART HEALTHY DIET/HEALTHY PLATE METHOD ? ?   ? ?Plan: Telephone follow up appointment with care management team member scheduled for:  1 MONTH ? ?Signature ?Regina Eck, PharmD, BCPS ?Clinical Pharmacist, [facility] ?[facility] Phone [phone] ? ? ?Please call the care guide team at [phone] if you need to cancel or reschedule your appointment.  ? ?The patient verbalized understanding of instructions, educational materials, and care plan provided today and declined offer to receive copy of patient instructions, educational materials, and care plan.  ? ?

## 2021-10-29 NOTE — Progress Notes (Signed)
? ? ?Chronic Care Management ?Pharmacy Note ? ?10/29/2021 ?Name:  Andrew [name] MRN:  [id number] DOB:  12/08/1945 ? ?Summary: ? ?Hypertension ?-goal <130/80 ?-patient at goal--continue current regimen ?-true [place] metric documented ? ?Diabetes: New goal. ?Uncontrolled-RECENT HOSPITALIZATION FOR HYPERGLYCEMIA, A1C NOW INCREASING -->7.9%, BLOOD SUGAR HAS IMPROVED OVER THE PAST 2-3 WEEKS ?CURRENTLY UNDERGOING CHEMOTHERAPY FOR PROSTATE CANCER ?Current treatment:METFORMIN, FARXIGA SAMPLES-->tradjenta/farxiga samples-->QTERN;  ?Will transition patient to QTERN (DPP4/SGLT2) combo given higher blood sugars on current regimen (qtern is available through patient assistance program) ?Continue metformin GFR >60 ?Detailed instructions given to patient  ?New BG meter given--Contour Next ONE ?Education and set up provided ?Sent RX via parachute to [facility] for patient to get free glucometer ?Current glucose readings: fasting glucose: UP TO 200s, MOSTLY <190, post prandial glucose: 200S ?Denies hypoglycemic/hyperglycemic symptoms ?Discussed meal planning options and Plate method for healthy eating ?Avoid sugary drinks and desserts ?Incorporate balanced protein, non starchy veggies, 1 serving of carbohydrate with each meal ?Increase water intake ?Increase physical activity as able ?Current exercise: UNABLE DUE TO CHEMO ?Educated on new medication regimen ?Assessed patient finances. Enrolled in az&me patient assistance for QTERN ? ?Subjective: ?Andrew [name] is an 76 y.o. year old male who is a primary patient of Stacks, Cletus Gash, MD.  The CCM team was consulted for assistance with disease management and care coordination needs.   ? ?Engaged with patient face to face for initial visit in response to provider referral for pharmacy case management and/or care coordination services.  ? ?Consent to Services:  ?The patient was given the following information about Chronic Care Management services today, agreed to services, and gave  verbal consent: 1. CCM service includes personalized support from designated clinical staff supervised by the primary care provider, including individualized plan of care and coordination with other care providers 2. 24/7 contact phone numbers for assistance for urgent and routine care needs. 3. Service will only be billed when office clinical staff spend 20 minutes or more in a month to coordinate care. 4. Only one practitioner may furnish and bill the service in a calendar month. 5.The patient may stop CCM services at any time (effective at the end of the month) by phone call to the office staff. 6. The patient will be responsible for cost sharing (co-pay) of up to 20% of the service fee (after annual deductible is met). Patient agreed to services and consent obtained. ? ?Patient Care Team: ?[name], MD as PCP - General (Family Medicine) ?Minus Breeding, MD as Consulting Physician (Cardiology) ?McKenzie, Candee Furbish, MD as Consulting Physician (Urology) ?Ilean China, RN as Case Manager ?Lavera Guise, [facility] (Pharmacist) ?Steffanie Rainwater, DPM as Consulting Physician (Podiatry) ?Pyrtle, Lajuan Lines, MD as Consulting Physician (Gastroenterology) ?Harlen Labs, MD as Referring Physician (Optometry) ?Derek Jack, MD as Medical Oncologist (Medical Oncology) ? ?Objective: ? ?Lab Results  ?Component Value Date  ? CREATININE 1.11 10/21/2021  ? CREATININE 1.39 (H) 10/05/2021  ? CREATININE 1.30 (H) 10/05/2021  ? ? ?Lab Results  ?Component Value Date  ? HGBA1C 7.9 (H) 10/02/2021  ? ?Last diabetic Eye exam:  ?Lab Results  ?Component Value Date/Time  ? HMDIABEYEEXA No Retinopathy 08/28/2017 12:00 AM  ?  ?Last diabetic Foot exam: No results found for: HMDIABFOOTEX  ? ?   ?Component Value Date/Time  ? CHOL 129 10/02/2021 1500  ? CHOL 144 11/18/2012 0844  ? TRIG 251 (H) 10/02/2021 1500  ? TRIG 142 01/03/2016 0800  ? TRIG 161 (H) 11/18/2012 0844  ?  HDL 32 (L) 10/02/2021 1500  ? HDL 31 (L) 01/03/2016 0800  ? HDL 27 (L)  11/18/2012 0844  ? CHOLHDL 4.0 10/02/2021 1500  ? LDLCALC 57 10/02/2021 1500  ? LDLCALC 45 03/08/2014 0810  ? [place] 85 11/18/2012 0844  ? ? ? ?  Latest Ref Rng & Units 10/21/2021  ?  8:31 AM 10/05/2021  ?  9:11 AM 10/04/2021  ?  3:57 PM  ?Hepatic Function  ?Total Protein 6.5 - 8.1 g/dL 6.7    7.3    ?Albumin 3.5 - 5.0 g/dL 3.3   2.8   3.8    ?AST 15 - 41 U/L 27    30    ?ALT 0 - 44 U/L 19    33    ?Alk Phosphatase 38 - 126 U/L 65    114    ?Total Bilirubin 0.3 - 1.2 mg/dL 0.8    1.0    ? ? ?Lab Results  ?Component Value Date/Time  ? TSH 1.540 04/10/2020 10:25 AM  ? TSH 1.950 12/29/2018 08:07 AM  ? ? ? ?  Latest Ref Rng & Units 10/21/2021  ?  8:31 AM 10/04/2021  ?  3:57 PM 10/02/2021  ?  3:00 PM  ?CBC  ?WBC 4.0 - 10.5 K/uL 5.8   7.1   7.0    ?Hemoglobin 13.0 - 17.0 g/dL 10.8   12.0   11.5    ?Hematocrit 39.0 - 52.0 % 32.1   35.2   35.3    ?Platelets 150 - 400 K/uL 199   167   166    ? ? ?Lab Results  ?Component Value Date/Time  ? VD25OH 33.9 07/26/2020 11:12 AM  ? VD25OH 31.1 04/10/2020 10:25 AM  ? ? ?Clinical ASCVD: No  ?The ASCVD Risk score (Arnett DK, et al., 2019) failed to calculate for the following reasons: ?  The valid total cholesterol range is 130 to 320 mg/dL   ? ?Other: (CHADS2VASc if Afib, PHQ9 if depression, MMRC or CAT for COPD, ACT, DEXA) ? ?Social History  ? ?Tobacco Use  ?Smoking Status Every Day  ? Packs/day: 1.00  ? Years: 55.00  ? Pack years: 55.00  ? Types: Cigarettes  ? Start date: 06/30/1965  ?Smokeless Tobacco Never  ?Tobacco Comments  ? Has tried nicotine replacement gum  ? ?BP Readings from Last 3 Encounters:  ?10/29/21 (!) 96/51  ?10/23/21 117/60  ?10/21/21 130/71  ? ?Pulse Readings from Last 3 Encounters:  ?10/29/21 (!) 101  ?10/23/21 (!) 102  ?10/21/21 63  ? ?Wt Readings from Last 3 Encounters:  ?10/29/21 194 lb (88 kg)  ?10/21/21 198 lb 12.8 oz (90.2 kg)  ?10/17/21 193 lb (87.5 kg)  ? ? ?Assessment: Review of patient past medical history, allergies, medications, health status, including review of  consultants reports, laboratory and other test data, was performed as part of comprehensive evaluation and provision of chronic care management services.  ? ?SDOH:  (Social Determinants of Health) assessments and interventions performed:  ? ? ?CCM Care Plan ? ?No Known Allergies ? ?Medications Reviewed Today   ? ? Reviewed by [name], MD (Physician) on 10/29/21 at [facility] List Status: <None>  ? ?Medication Order Taking? Sig Documenting Provider Last Dose Status Informant  ?alfuzosin (UROXATRAL) 10 MG 24 hr tablet [id number] Yes Take 1 tablet (10 mg total) by mouth at bedtime. McKenzie, Candee Furbish, MD Taking Active Self, Spouse/Significant Other  ?aspirin 81 MG tablet [id number] Yes Take 1 tablet (81 mg total) by  mouth daily with breakfast. Pappayliou, Flint Melter, DO Taking Active   ?atorvastatin (LIPITOR) 40 MG tablet [id number] Yes Take 1 tablet (40 mg total) by mouth daily.  ?Patient taking differently: Take 40 mg by mouth at bedtime.  ? [name], MD Taking Active Self, Spouse/Significant Other  ?azelastine (ASTELIN) 0.1 % nasal spray [id number] Yes Place 1 spray into both nostrils 2 (two) times daily as needed for allergies. [provider] Taking Active Self, Spouse/Significant Other  ?[place] 5-2.5-18.5 LF-MCG/0.5 injection [id number] Yes  [provider] Taking Active   ?cholecalciferol (VITAMIN D) 1000 UNITS tablet [id number] Yes Take 1,000 Units by mouth daily.  [provider] Taking Active Self, Spouse/Significant Other  ?Dapagliflozin-sAXagliptin (QTERN) 10-5 MG TABS [id number] Yes Take 1 tablet by mouth in the morning. [name], MD Taking Active   ?DOCEtaxel (TAXOTERE IV) [id number] Yes Inject into the vein every 21 ( twenty-one) days. [provider] Taking Active Self, Spouse/Significant Other  ?docusate sodium (COLACE) 100 MG capsule [id number] Yes Take 1 capsule (100 mg total) by mouth 2 (two) times daily. Pappayliou, Flint Melter, DO Taking Active    ?escitalopram (LEXAPRO) 10 MG tablet [id number] Yes Take 1 tablet (10 mg total) by mouth daily. [name], MD  Active   ?glucose blood test strip [id number] Yes 1 each by Other route as needed for other

## 2021-10-29 NOTE — Progress Notes (Signed)
? ?[facility] ?[address] ?[place], [name] ? ? ?CLINIC:  ?Medical Oncology/Hematology ? ?PCP:  ?[name], MD ?[address] [place] [address] ?[phone] ? ? ?REASON FOR VISIT:   New start chemotherapy follow-up visit ? ?CURRENT THERAPY: Taxotere (docetaxel) ? ?LAST TREATMENT DATE: 10/21/2021 ? ?BRIEF ONCOLOGIC HISTORY:  ?Oncology History  ?Prostate cancer ([place])  ?04/07/2014 Initial Diagnosis  ? Prostate cancer metastatic to intrapelvic lymph node ([place]) ?  ? Genetic Testing  ? [facility] Multi-Cancer Panel was Negative. Report date is 05/07/2021. ? ?The Multi-Cancer + RNA Panel offered by [facility] includes sequencing and/or deletion/duplication analysis of the following 84 genes:  AIP*, ALK, APC*, ATM*, AXIN2*, BAP1*, BARD1*, BLM*, BMPR1A*, BRCA1*, BRCA2*, BRIP1*, CASR, CDC73*, CDH1*, CDK4, CDKN1B*, CDKN1C*, CDKN2A, CEBPA, CHEK2*, CTNNA1*, DICER1*, DIS3L2*, EGFR, EPCAM, FH*, FLCN*, GATA2*, GPC3, GREM1, HOXB13, HRAS, KIT, MAX*, MEN1*, MET, MITF, MLH1*, MSH2*, MSH3*, MSH6*, MUTYH*, NBN*, NF1*, NF2*, NTHL1*, PALB2*, PDGFRA, PHOX2B, PMS2*, POLD1*, POLE*, POT1*, PRKAR1A*, PTCH1*, PTEN*, RAD50*, RAD51C*, RAD51D*, RB1*, RECQL4, RET, RUNX1*, SDHA*, SDHAF2*, SDHB*, SDHC*, SDHD*, SMAD4*, SMARCA4*, SMARCB1*, SMARCE1*, STK11*, SUFU*, TERC, TERT, TMEM127*, Tp53*, TSC1*, TSC2*, VHL*, WRN*, and WT1.  RNA analysis is performed for * genes. ?  ?10/21/2021 -  Chemotherapy  ? Patient is on Treatment Plan : PROSTATE Docetaxel + Prednisone q21d  ? ?  ?  ? ? ?CANCER STAGING: ?Cancer Staging  ?Prostate cancer ([place]) ?Staging form: Prostate, [name] 7th Edition ?- Clinical stage from 04/02/2021: rT2b, N1, PSA: 10 to 19, Gleason 7 - Unsigned ? ? ?INTERVAL HISTORY:  ?[name], a 76 y.o. male, is managed by Dr. Delton Coombes for metastatic castration refractory prostate cancer.  He received first cycle of treatment with docetaxel on 10/21/2021, and received pegfilgrastim on 10/23/2021. He is seen today for toxicity  check and follow-up of new start chemo. ? ?[name] has had some mild diarrhea following chemotherapy - about 2-3 loose stools daily for 3 days.  He denies any nausea, vomiting, or constipation.  No mouth sores or new neuropathy. ?He has moderate fatigue, with energy about 60%.  He reports dizziness and lightheadedness with standing.  He has been drinking very little water - less than 20 ounces daily.  He has decreased appetite, eating about 50% of his normal diet and drinking Ensure a few times per week.  His weight today is 190 pounds, which is down 8 pounds in the past week. ? ? ?REVIEW OF SYSTEMS:  ?Review of Systems  ?Constitutional:  Positive for appetite change, fatigue and unexpected weight change. Negative for chills, diaphoresis and fever.  ?HENT:   Negative for lump/mass and nosebleeds.   ?Eyes:  Negative for eye problems.  ?Respiratory:  Positive for shortness of breath (with exertion). Negative for cough and hemoptysis.   ?Cardiovascular:  Negative for chest pain, leg swelling and palpitations.  ?Gastrointestinal:  Positive for diarrhea. Negative for abdominal pain, blood in stool, constipation, nausea and vomiting.  ?Genitourinary:  Positive for frequency. Negative for hematuria.   ?Skin: Negative.   ?Neurological:  Positive for light-headedness. Negative for dizziness and headaches.  ?Hematological:  Does not bruise/bleed easily.  ? ? ?PAST MEDICAL/SURGICAL HISTORY:  ?Past Medical History:  ?Diagnosis Date  ? AKI (acute [name] injury) ([place]) 10/04/2021  ? Anemia   ? BPH (benign prostatic hyperplasia)   ? Sees Dr [name]  ? Cataract   ? Diabetes mellitus without complication ([place])   ? Educated about COVID-19 virus infection 07/18/2019  ? Genetic testing  05/09/2021  ? [facility] Multi-Cancer Panel was Negative. Report date is 05/07/2021.  The Multi-Cancer + RNA Panel offered by [facility] includes sequencing and/or deletion/duplication analysis of the following 84 genes:  AIP*, ALK, APC*, ATM*, AXIN2*, BAP1*,  BARD1*, BLM*, BMPR1A*, BRCA1*, BRCA2*, BRIP1*, CASR, CDC73*, CDH1*, CDK4, CDKN1B*, CDKN1C*, CDKN2A, CEBPA, CHEK2*, CTNNA1*, DICER1*, DIS3L2*, EGFR, EPCAM, FH*, F  ? Hyperlipidemia   ? Hypertension   ? Low serum vitamin D   ? Port-A-Cath in place 10/09/2021  ? Prostate cancer [facility]) 2008  ? w/seed implantation and radiation  ? ?Past Surgical History:  ?Procedure Laterality Date  ? COLONOSCOPY  [id number]  ? PORTACATH PLACEMENT Right 10/17/2021  ? Procedure: INSERTION PORT-A-CATH, R IJ;  Surgeon: [name], DO;  Location: [name];  Service: General;  Laterality: Right;  pt needs to have glucose checked AM of surgery  ? PROSTATE SURGERY  2008  ? seed implant  ? ? ?SOCIAL HISTORY:  ?Social History  ? ?Socioeconomic History  ? Marital status: Married  ?  Spouse name: Andrew [name]  ? Number of children: 2  ? Years of education: 53  ? Highest education level: 12th grade  ?Occupational History  ? Occupation: [employer]  ?  Comment: Retired Geologist, engineering  ?Tobacco Use  ? Smoking status: Every Day  ?  Packs/day: 1.00  ?  Years: 55.00  ?  Pack years: 55.00  ?  Types: Cigarettes  ?  Start date: 06/30/1965  ? Smokeless tobacco: Never  ? Tobacco comments:  ?  Has tried nicotine replacement gum  ?Vaping Use  ? Vaping Use: Never used  ?Substance and Sexual Activity  ? Alcohol use: No  ? Drug use: No  ? Sexual activity: Yes  ?Other Topics Concern  ? Not on file  ?Social History Narrative  ? Retired, lives at home with wife Andrew [name], two grown children, enjoys gardening    ? ?Social Determinants of Health  ? ?Financial Resource Strain: Low Risk   ? Difficulty of Paying Living Expenses: Not hard at all  ?Food Insecurity: No Food Insecurity  ? Worried About Charity fundraiser in the Last Year: Never true  ? Ran Out of Food in the Last Year: Never true  ?Transportation Needs: No Transportation Needs  ? Lack of Transportation (Medical): No  ? Lack of Transportation (Non-Medical): No  ?Physical Activity: Inactive  ? Days of Exercise per  Week: 0 days  ? Minutes of Exercise per Session: 0 min  ?Stress: No Stress Concern Present  ? Feeling of Stress : Not at all  ?Social Connections: Socially Integrated  ? Frequency of Communication with Friends and Family: More than three times a week  ? Frequency of Social Gatherings with Friends and Family: More than three times a week  ? Attends Religious Services: More than 4 times per year  ? Active Member of Clubs or Organizations: Yes  ? Attends Archivist Meetings: More than 4 times per year  ? Marital Status: Married  ?Intimate Partner Violence: Not At Risk  ? Fear of Current or Ex-Partner: No  ? Emotionally Abused: No  ? Physically Abused: No  ? Sexually Abused: No  ? ? ?FAMILY HISTORY: Family history has been reviewed by me and is documented elsewhere in the electronic medical record. ? ?CURRENT MEDICATIONS: Current medications have been reviewed by me and are documented elsewhere in electronic medical record. ? ?ALLERGIES: Drug allergies have been reviewed by me and are documented elsewhere in the electronic medical  record. ? ?PHYSICAL EXAM:  ?Performance status (ECOG): 2 - Symptomatic, <50% confined to bed ?There were no vitals filed for this visit. ?Wt Readings from Last 3 Encounters:  ?10/29/21 194 lb (88 kg)  ?10/21/21 198 lb 12.8 oz (90.2 kg)  ?10/17/21 193 lb (87.5 kg)  ? ?Physical Exam ?Constitutional:   ?   Appearance: Normal appearance. He is obese.  ?HENT:  ?   Head: Normocephalic and atraumatic.  ?   Mouth/Throat:  ?   Mouth: Mucous membranes are moist.  ?Eyes:  ?   Extraocular Movements: Extraocular movements intact.  ?   Pupils: Pupils are equal, round, and reactive to light.  ?Cardiovascular:  ?   Rate and Rhythm: Regular rhythm. Tachycardia present.  ?   Pulses: Normal pulses.  ?   Heart sounds: Normal heart sounds.  ?Pulmonary:  ?   Effort: Pulmonary effort is normal.  ?   Breath sounds: Normal breath sounds.  ?Abdominal:  ?   General: Bowel sounds are normal.  ?   Palpations:  Abdomen is soft.  ?   Tenderness: There is no abdominal tenderness.  ?Musculoskeletal:     ?   General: No swelling.  ?   Right lower leg: No edema.  ?   Left lower leg: No edema.  ?Lymphadenopathy:  ?   Ce

## 2021-10-29 NOTE — Progress Notes (Signed)
? ?Subjective:  ?Patient ID: Andrew [name], male    DOB: [birth year]  Age: 76 y.o. MRN: [id number] ? ?CC: Medical Management of Chronic Issues ? ? ?HPI ?Andrew [name] presents forFollow-up of diabetes. Patient checks blood sugar at home.  ?Log shows that it is running from about 1 50-2 50 in the mornings still.  He is just started chemotherapy and his appetite is diminished.  He request something to stimulate his appetite.  However he was just discharged from the hospital several days ago for hyperglycemia.  When he was sent his sugar was 700 last month.  ?Patient denies symptoms such as polyuria, polydipsia, excessive hunger, nausea ?No significant hypoglycemic spells noted. ?Medications reviewed. HE is a bit confused. Saw pharmacy and has samples of Tradjenta and Farxiga to take daily. ?Andrew [name] is very worried about his health.  His prostate cancer shows evidence for metastases on his bone scan going to the ribs.  His chemo for his prostate cancer is just beginning. ? ? ? ?  10/29/2021  ? 10:18 AM 10/29/2021  ? 10:11 AM 10/02/2021  ?  2:56 PM 05/01/2021  ? 10:35 AM 05/01/2021  ?  9:58 AM  ?Depression screen PHQ 2/9  ?Decreased Interest 2 0 0 0 0  ?Down, Depressed, Hopeless 1 0 0 1 0  ?PHQ - 2 Score 3 0 0 1 0  ?Altered sleeping 1   2   ?Tired, decreased energy 2   2   ?Change in appetite 3   1   ?Feeling bad or failure about yourself  0   0   ?Trouble concentrating 1   1   ?Moving slowly or fidgety/restless 1   0   ?Suicidal thoughts 0   0   ?PHQ-9 Score 11   7   ?Difficult doing work/chores Not difficult at all   Somewhat difficult   ? ? ? ?  10/29/2021  ? 10:19 AM 05/01/2021  ? 10:36 AM 01/29/2021  ?  9:57 AM 01/16/2021  ?  2:36 PM  ?GAD 7 : Generalized Anxiety Score  ?Nervous, Anxious, on Edge 1 1 0 1  ?Control/stop worrying '2 1 1 1  '$ ?Worry too much - different things '2 1 1 1  '$ ?Trouble relaxing '2 1 1 1  '$ ?Restless 2 0 0 1  ?Easily annoyed or irritable 0 0 1 0  ?Afraid - awful might happen 0 1 1 0  ?Total GAD 7 Score '9 5 5 5   '$ ?Anxiety Difficulty Not difficult at all Not difficult at all Not difficult at all Not difficult at all  ? ? ? ? ? ?History ?Andrew [name] has a past medical history of Anemia, BPH (benign prostatic hyperplasia), Cataract, Diabetes mellitus without complication ([place]), Hyperlipidemia, Hypertension, Low serum vitamin D, Port-A-Cath in place (10/09/2021), and Prostate cancer ([place]) (2008).  ? ?He has a past surgical history that includes Prostate surgery (2008); Colonoscopy (7846,9629); and Portacath placement (Right, 10/17/2021).  ? ?His family history includes Colon polyps in his brother; Deep vein thrombosis in his sister; Diabetes in his mother; Emphysema in his father; Gout in his brother; Heart disease in his mother; Hypertension in his brother; Kidney disease in his mother and sister; Lung cancer in his sister.He reports that he has been smoking cigarettes. He started smoking about 56 years ago. He has a 55.00 pack-year smoking history. He has never used smokeless tobacco. He reports that he does not drink alcohol and does not use drugs. ? ?Current Outpatient Medications on  File Prior to Visit  ?Medication Sig Dispense Refill  ? alfuzosin (UROXATRAL) 10 MG 24 hr tablet Take 1 tablet (10 mg total) by mouth at bedtime. 90 tablet 3  ? aspirin 81 MG tablet Take 1 tablet (81 mg total) by mouth daily with breakfast. 30 tablet 3  ? atorvastatin (LIPITOR) 40 MG tablet Take 1 tablet (40 mg total) by mouth daily. (Patient taking differently: Take 40 mg by mouth at bedtime.) 90 tablet 3  ? azelastine (ASTELIN) 0.1 % nasal spray Place 1 spray into both nostrils 2 (two) times daily as needed for allergies.    ? [place] 5-2.5-18.5 LF-MCG/0.5 injection     ? cholecalciferol (VITAMIN D) 1000 UNITS tablet Take 1,000 Units by mouth daily.     ? DOCEtaxel (TAXOTERE IV) Inject into the vein every 21 ( twenty-one) days.    ? docusate sodium (COLACE) 100 MG capsule Take 1 capsule (100 mg total) by mouth 2 (two) times daily. 60 capsule 2  ?  glucose blood test strip 1 each by Other route as needed for other. Use as instructed to test blood sugar daily.  One Touch Verio Test Strips DX: E11.65 100 each 11  ? lidocaine-prilocaine (EMLA) cream Apply a small amount to port a cath site and cover with plastic wrap 1 hour prior to chemotherapy appointments 30 g 3  ? lisinopril-hydrochlorothiazide (ZESTORETIC) 20-25 MG tablet Take 1 tablet by mouth daily. 90 tablet 3  ? metFORMIN (GLUCOPHAGE) 1000 MG tablet Take 1 tablet (1,000 mg total) by mouth 2 (two) times daily with a meal. 180 tablet 2  ? oxyCODONE (ROXICODONE) 5 MG immediate release tablet Take 1 tablet (5 mg total) by mouth every 8 (eight) hours as needed. 10 tablet 0  ? potassium chloride SA (KLOR-CON M) 20 MEQ tablet Take 1 tablet (20 mEq total) by mouth daily. 30 tablet 6  ? prochlorperazine (COMPAZINE) 10 MG tablet Take 1 tablet (10 mg total) by mouth every 6 (six) hours as needed (Nausea or vomiting). 30 tablet 2  ? SHINGRIX injection     ? ?No current facility-administered medications on file prior to visit.  ? ? ?ROS ?Review of Systems  ?Constitutional:  Negative for fever.  ?Respiratory:  Negative for shortness of breath.   ?Cardiovascular:  Negative for chest pain.  ?Musculoskeletal:  Negative for arthralgias.  ?Skin:  Negative for rash.  ? ?Objective:  ?BP (!) 96/51   Pulse (!) 101   Temp (!) 97.5 ?F (36.4 ?C)   Ht 5' 5.75" (1.67 m)   Wt 194 lb (88 kg)   SpO2 98%   BMI 31.55 kg/m?  ? ?BP Readings from Last 3 Encounters:  ?10/29/21 (!) 96/51  ?10/23/21 117/60  ?10/21/21 130/71  ? ? ?Wt Readings from Last 3 Encounters:  ?10/29/21 194 lb (88 kg)  ?10/21/21 198 lb 12.8 oz (90.2 kg)  ?10/17/21 193 lb (87.5 kg)  ? ? ? ?Physical Exam ?Vitals reviewed.  ?Constitutional:   ?   Appearance: He is well-developed.  ?HENT:  ?   Head: Normocephalic and atraumatic.  ?   Right Ear: External ear normal.  ?   Left Ear: External ear normal.  ?   Mouth/Throat:  ?   Pharynx: No oropharyngeal exudate or  posterior oropharyngeal erythema.  ?Eyes:  ?   Pupils: Pupils are equal, round, and reactive to light.  ?Cardiovascular:  ?   Rate and Rhythm: Normal rate and regular rhythm.  ?   Heart sounds: No murmur heard. ?  Pulmonary:  ?   Effort: No respiratory distress.  ?   Breath sounds: Normal breath sounds.  ?Musculoskeletal:  ?   Cervical back: Normal range of motion and neck supple.  ?Neurological:  ?   Mental Status: He is alert and oriented to person, place, and time.  ?Psychiatric:     ?   Mood and Affect: Mood is depressed. Affect is blunt.  ? ? ? ? ?Assessment & Plan:  ? ?There are no diagnoses linked to this encounter. ? ? ? ?I am having [name] maintain his cholecalciferol, glucose blood, alfuzosin, potassium chloride SA, azelastine, atorvastatin, lisinopril-hydrochlorothiazide, metFORMIN, DOCEtaxel (TAXOTERE IV), lidocaine-prilocaine, prochlorperazine, aspirin, oxyCODONE, docusate sodium, Boostrix, Shingrix, and Qtern. ? ?No orders of the defined types were placed in this encounter. ? ? ? ?Follow-up: No follow-ups on file. ? ?[name], M.D. ?

## 2021-10-30 ENCOUNTER — Telehealth: Payer: Medicare HMO | Admitting: *Deleted

## 2021-10-30 ENCOUNTER — Inpatient Hospital Stay (HOSPITAL_COMMUNITY): Payer: Medicare HMO

## 2021-10-30 ENCOUNTER — Inpatient Hospital Stay (HOSPITAL_COMMUNITY): Payer: Medicare HMO | Attending: Physician Assistant | Admitting: Physician Assistant

## 2021-10-30 VITALS — BP 99/54 | HR 98 | Temp 97.7°F | Resp 18 | Wt 190.7 lb

## 2021-10-30 VITALS — BP 111/57 | HR 81 | Temp 98.0°F | Resp 18

## 2021-10-30 DIAGNOSIS — Z79899 Other long term (current) drug therapy: Secondary | ICD-10-CM | POA: Insufficient documentation

## 2021-10-30 DIAGNOSIS — Z5189 Encounter for other specified aftercare: Secondary | ICD-10-CM | POA: Diagnosis not present

## 2021-10-30 DIAGNOSIS — Z5111 Encounter for antineoplastic chemotherapy: Secondary | ICD-10-CM | POA: Insufficient documentation

## 2021-10-30 DIAGNOSIS — C775 Secondary and unspecified malignant neoplasm of intrapelvic lymph nodes: Secondary | ICD-10-CM | POA: Insufficient documentation

## 2021-10-30 DIAGNOSIS — Z7982 Long term (current) use of aspirin: Secondary | ICD-10-CM | POA: Insufficient documentation

## 2021-10-30 DIAGNOSIS — C61 Malignant neoplasm of prostate: Secondary | ICD-10-CM

## 2021-10-30 DIAGNOSIS — F17210 Nicotine dependence, cigarettes, uncomplicated: Secondary | ICD-10-CM | POA: Insufficient documentation

## 2021-10-30 DIAGNOSIS — Z95828 Presence of other vascular implants and grafts: Secondary | ICD-10-CM

## 2021-10-30 DIAGNOSIS — Z09 Encounter for follow-up examination after completed treatment for conditions other than malignant neoplasm: Secondary | ICD-10-CM

## 2021-10-30 DIAGNOSIS — R69 Illness, unspecified: Secondary | ICD-10-CM | POA: Diagnosis not present

## 2021-10-30 DIAGNOSIS — E860 Dehydration: Secondary | ICD-10-CM | POA: Diagnosis not present

## 2021-10-30 LAB — COMPREHENSIVE METABOLIC PANEL
ALT: 19 U/L (ref 0–44)
AST: 26 U/L (ref 15–41)
Albumin: 3.3 g/dL — ABNORMAL LOW (ref 3.5–5.0)
Alkaline Phosphatase: 83 U/L (ref 38–126)
Anion gap: 11 (ref 5–15)
BUN: 16 mg/dL (ref 8–23)
CO2: 26 mmol/L (ref 22–32)
Calcium: 8.9 mg/dL (ref 8.9–10.3)
Chloride: 101 mmol/L (ref 98–111)
Creatinine, Ser: 1.47 mg/dL — ABNORMAL HIGH (ref 0.61–1.24)
GFR, Estimated: 49 mL/min — ABNORMAL LOW (ref >60)
Glucose, Bld: 171 mg/dL — ABNORMAL HIGH (ref 70–99)
Potassium: 3.5 mmol/L (ref 3.5–5.1)
Sodium: 138 mmol/L (ref 135–145)
Total Bilirubin: 0.8 mg/dL (ref 0.3–1.2)
Total Protein: 7 g/dL (ref 6.5–8.1)

## 2021-10-30 LAB — CBC WITH DIFFERENTIAL/PLATELET
Band Neutrophils: 4 %
Basophils Absolute: 0 10*3/uL (ref 0.0–0.1)
Basophils Relative: 0 %
Eosinophils Absolute: 0 10*3/uL (ref 0.0–0.5)
Eosinophils Relative: 0 %
HCT: 33 % — ABNORMAL LOW (ref 39.0–52.0)
Hemoglobin: 10.9 g/dL — ABNORMAL LOW (ref 13.0–17.0)
Lymphocytes Relative: 13 %
Lymphs Abs: 2.1 10*3/uL (ref 0.7–4.0)
MCH: 31.6 pg (ref 26.0–34.0)
MCHC: 33 g/dL (ref 30.0–36.0)
MCV: 95.7 fL (ref 80.0–100.0)
Metamyelocytes Relative: 1 %
Monocytes Absolute: 1.3 10*3/uL — ABNORMAL HIGH (ref 0.1–1.0)
Monocytes Relative: 8 %
Myelocytes: 5 %
Neutro Abs: 11.4 10*3/uL — ABNORMAL HIGH (ref 1.7–7.7)
Neutrophils Relative %: 68 %
Platelets: 219 10*3/uL (ref 150–400)
Promyelocytes Relative: 1 %
RBC: 3.45 MIL/uL — ABNORMAL LOW (ref 4.22–5.81)
RDW: 13.4 % (ref 11.5–15.5)
WBC: 15.8 10*3/uL — ABNORMAL HIGH (ref 4.0–10.5)
nRBC: 0.3 % — ABNORMAL HIGH (ref 0.0–0.2)

## 2021-10-30 LAB — MAGNESIUM: Magnesium: 1.7 mg/dL (ref 1.7–2.4)

## 2021-10-30 MED ORDER — SODIUM CHLORIDE 0.9 % IV SOLN: Freq: Once | INTRAVENOUS | Status: AC

## 2021-10-30 MED ORDER — SODIUM CHLORIDE 0.9% FLUSH
10.0000 mL | Freq: Once | INTRAVENOUS | Status: AC
  Administered 2021-10-30: 10 mL via INTRAVENOUS

## 2021-10-30 MED ORDER — HEPARIN SOD (PORK) LOCK FLUSH 100 UNIT/ML IV SOLN
500.0000 [IU] | Freq: Once | INTRAVENOUS | Status: AC
  Administered 2021-10-30: 500 [IU] via INTRAVENOUS

## 2021-10-30 NOTE — Patient Instructions (Signed)
[place] at [facility] ?Discharge Instructions ? ?You were seen today by Tarri Abernethy PA-C for your follow-up after your first cycle of chemotherapy. ? ?You were given IV fluids today due to dehydration.  Make sure that you are drinking at least 64 ounces of water daily. ? ?You have lost 8 pounds over the past week.  It is important that you maintain adequate nutrition while you are receiving cancer treatment. ?- Eat small frequent meals throughout the day. ?- Drink at least 2 Ensure or Boost beverages daily. ? ?FOLLOW-UP APPOINTMENT: You are scheduled for labs, doctor's visit, and next cycle of chemotherapy on 11/11/2021. ? ? ?Thank you for choosing [facility] at [facility] to provide your oncology and hematology care.  To afford each patient quality time with our provider, please arrive at least 15 minutes before your scheduled appointment time.  ? ?If you have a lab appointment with the [facility] please come in thru the Main Entrance and check in at the main information desk. ? ?You need to re-schedule your appointment should you arrive 10 or more minutes late.  We strive to give you quality time with our providers, and arriving late affects you and other patients whose appointments are after yours.  Also, if you no show three or more times for appointments you may be dismissed from the clinic at the providers discretion.     ?Again, thank you for choosing [facility].  Our hope is that these requests will decrease the amount of time that you wait before being seen by our physicians.       ?_____________________________________________________________ ? ?Should you have questions after your visit to [facility], please contact our office at [phone] and follow the prompts.  Our office hours are 8:00 a.m. and 4:30 p.m. Monday - Friday.  Please note that voicemails left after 4:00 p.m. may not be returned until the following  business day.  We are closed weekends and major holidays.  You do have access to a nurse 24-7, just call the main number to the clinic [phone] and do not press any options, hold on the line and a nurse will answer the phone.   ? ?For prescription refill requests, have your pharmacy contact our office and allow 72 hours.   ? ?Due to Covid, you will need to wear a mask upon entering the hospital. If you do not have a mask, a mask will be given to you at the Main Entrance upon arrival. For doctor visits, patients may have 1 support person age 33 or older with them. For treatment visits, patients can not have anyone with them due to social distancing guidelines and our immunocompromised population.  ? ? ? ?

## 2021-10-30 NOTE — Patient Instructions (Signed)
[place]  Discharge Instructions: ?Thank you for choosing [facility] to provide your oncology and hematology care.  ?If you have a lab appointment with the [facility], please come in thru the Main Entrance and check in at the main information desk. ? ?Wear comfortable clothing and clothing appropriate for easy access to any Portacath or PICC line.  ? ?We strive to give you quality time with your provider. You may need to reschedule your appointment if you arrive late (15 or more minutes).  Arriving late affects you and other patients whose appointments are after yours.  Also, if you miss three or more appointments without notifying the office, you may be dismissed from the clinic at the provider?s discretion.    ?  ?For prescription refill requests, have your pharmacy contact our office and allow 72 hours for refills to be completed.   ? ?Today you received the following chemotherapy and/or immunotherapy agents 1 liter of Normal Saline.     ?  ?To help prevent nausea and vomiting after your treatment, we encourage you to take your nausea medication as directed. ? ?BELOW ARE SYMPTOMS THAT SHOULD BE REPORTED IMMEDIATELY: ?*FEVER GREATER THAN 100.4 F (38 ?C) OR HIGHER ?*CHILLS OR SWEATING ?*NAUSEA AND VOMITING THAT IS NOT CONTROLLED WITH YOUR NAUSEA MEDICATION ?*UNUSUAL SHORTNESS OF BREATH ?*UNUSUAL BRUISING OR BLEEDING ?*URINARY PROBLEMS (pain or burning when urinating, or frequent urination) ?*BOWEL PROBLEMS (unusual diarrhea, constipation, pain near the anus) ?TENDERNESS IN MOUTH AND THROAT WITH OR WITHOUT PRESENCE OF ULCERS (sore throat, sores in mouth, or a toothache) ?UNUSUAL RASH, SWELLING OR PAIN  ?UNUSUAL VAGINAL DISCHARGE OR ITCHING  ? ?Items with * indicate a potential emergency and should be followed up as soon as possible or go to the Emergency Department if any problems should occur. ? ?Please show the CHEMOTHERAPY ALERT CARD or IMMUNOTHERAPY ALERT CARD at check-in to the  Emergency Department and triage nurse. ? ?Should you have questions after your visit or need to cancel or reschedule your appointment, please contact [facility] [phone]  and follow the prompts.  Office hours are 8:00 a.m. to 4:30 p.m. Monday - Friday. Please note that voicemails left after 4:00 p.m. may not be returned until the following business day.  We are closed weekends and major holidays. You have access to a nurse at all times for urgent questions. Please call the main number to the clinic [phone] and follow the prompts. ? ?For any non-urgent questions, you may also contact your provider using MyChart. We now offer e-Visits for anyone 54 and older to request care online for non-urgent symptoms. For details visit [email or web address]. ?  ?Also download the MyChart app! Go to the app store, search "MyChart", open the app, select [place], and log in with your MyChart username and password. ? ?Due to Covid, a mask is required upon entering the hospital/clinic. If you do not have a mask, one will be given to you upon arrival. For doctor visits, patients may have 1 support person aged 25 or older with them. For treatment visits, patients cannot have anyone with them due to current Covid guidelines and our immunocompromised population.  ?

## 2021-10-30 NOTE — Progress Notes (Signed)
1 Liter of NS  given today per MD orders. Tolerated infusion without adverse affects. Vital signs stable. No complaints at this time. Discharged from clinic ambulatory in stable condition. Alert and oriented x 3. F/U with [facility] as scheduled.    

## 2021-11-05 ENCOUNTER — Telehealth: Payer: Self-pay

## 2021-11-05 NOTE — Telephone Encounter (Signed)
Received notification from [place] regarding approval for QTERN 10/5. Patient assistance approved from 10/31/21 to 06/29/22. ? ?ALLOW 7-10 BUSINESS DAYS FOR MEDICATION TO SHIP ? ?Phone: [phone] ? ?

## 2021-11-11 ENCOUNTER — Inpatient Hospital Stay (HOSPITAL_COMMUNITY): Payer: Medicare HMO

## 2021-11-11 ENCOUNTER — Inpatient Hospital Stay (HOSPITAL_BASED_OUTPATIENT_CLINIC_OR_DEPARTMENT_OTHER): Payer: Medicare HMO | Admitting: Hematology

## 2021-11-11 VITALS — BP 141/65 | HR 72 | Temp 96.9°F | Resp 18

## 2021-11-11 DIAGNOSIS — Z7982 Long term (current) use of aspirin: Secondary | ICD-10-CM | POA: Diagnosis not present

## 2021-11-11 DIAGNOSIS — Z79899 Other long term (current) drug therapy: Secondary | ICD-10-CM | POA: Diagnosis not present

## 2021-11-11 DIAGNOSIS — C61 Malignant neoplasm of prostate: Secondary | ICD-10-CM | POA: Diagnosis not present

## 2021-11-11 DIAGNOSIS — Z5189 Encounter for other specified aftercare: Secondary | ICD-10-CM | POA: Diagnosis not present

## 2021-11-11 DIAGNOSIS — C775 Secondary and unspecified malignant neoplasm of intrapelvic lymph nodes: Secondary | ICD-10-CM | POA: Diagnosis not present

## 2021-11-11 DIAGNOSIS — E860 Dehydration: Secondary | ICD-10-CM | POA: Diagnosis not present

## 2021-11-11 DIAGNOSIS — R69 Illness, unspecified: Secondary | ICD-10-CM | POA: Diagnosis not present

## 2021-11-11 DIAGNOSIS — Z95828 Presence of other vascular implants and grafts: Secondary | ICD-10-CM

## 2021-11-11 DIAGNOSIS — Z5111 Encounter for antineoplastic chemotherapy: Secondary | ICD-10-CM | POA: Diagnosis not present

## 2021-11-11 LAB — CBC WITH DIFFERENTIAL/PLATELET
Abs Immature Granulocytes: 0.04 10*3/uL (ref 0.00–0.07)
Basophils Absolute: 0.1 10*3/uL (ref 0.0–0.1)
Basophils Relative: 1 %
Eosinophils Absolute: 0.1 10*3/uL (ref 0.0–0.5)
Eosinophils Relative: 2 %
HCT: 26.6 % — ABNORMAL LOW (ref 39.0–52.0)
Hemoglobin: 8.7 g/dL — ABNORMAL LOW (ref 13.0–17.0)
Immature Granulocytes: 1 %
Lymphocytes Relative: 23 %
Lymphs Abs: 1.5 10*3/uL (ref 0.7–4.0)
MCH: 30.9 pg (ref 26.0–34.0)
MCHC: 32.7 g/dL (ref 30.0–36.0)
MCV: 94.3 fL (ref 80.0–100.0)
Monocytes Absolute: 0.7 10*3/uL (ref 0.1–1.0)
Monocytes Relative: 12 %
Neutro Abs: 4 10*3/uL (ref 1.7–7.7)
Neutrophils Relative %: 61 %
Platelets: 559 10*3/uL — ABNORMAL HIGH (ref 150–400)
RBC: 2.82 MIL/uL — ABNORMAL LOW (ref 4.22–5.81)
RDW: 13.7 % (ref 11.5–15.5)
WBC: 6.5 10*3/uL (ref 4.0–10.5)
nRBC: 0 % (ref 0.0–0.2)

## 2021-11-11 LAB — COMPREHENSIVE METABOLIC PANEL
ALT: 103 U/L — ABNORMAL HIGH (ref 0–44)
AST: 64 U/L — ABNORMAL HIGH (ref 15–41)
Albumin: 2.3 g/dL — ABNORMAL LOW (ref 3.5–5.0)
Alkaline Phosphatase: 83 U/L (ref 38–126)
Anion gap: 9 (ref 5–15)
BUN: 19 mg/dL (ref 8–23)
CO2: 25 mmol/L (ref 22–32)
Calcium: 8.2 mg/dL — ABNORMAL LOW (ref 8.9–10.3)
Chloride: 100 mmol/L (ref 98–111)
Creatinine, Ser: 1.21 mg/dL (ref 0.61–1.24)
GFR, Estimated: >60 mL/min (ref >60)
Glucose, Bld: 128 mg/dL — ABNORMAL HIGH (ref 70–99)
Potassium: 3.6 mmol/L (ref 3.5–5.1)
Sodium: 134 mmol/L — ABNORMAL LOW (ref 135–145)
Total Bilirubin: 0.6 mg/dL (ref 0.3–1.2)
Total Protein: 6.6 g/dL (ref 6.5–8.1)

## 2021-11-11 LAB — MAGNESIUM: Magnesium: 1.6 mg/dL — ABNORMAL LOW (ref 1.7–2.4)

## 2021-11-11 MED ORDER — PALONOSETRON HCL INJECTION 0.25 MG/5ML
0.2500 mg | Freq: Once | INTRAVENOUS | Status: AC
  Administered 2021-11-11: 0.25 mg via INTRAVENOUS
  Filled 2021-11-11: qty 5

## 2021-11-11 MED ORDER — SODIUM CHLORIDE 0.9 % IV SOLN
50.0000 mg/m2 | Freq: Once | INTRAVENOUS | Status: AC
  Administered 2021-11-11: 100 mg via INTRAVENOUS
  Filled 2021-11-11: qty 10

## 2021-11-11 MED ORDER — HEPARIN SOD (PORK) LOCK FLUSH 100 UNIT/ML IV SOLN
500.0000 [IU] | Freq: Once | INTRAVENOUS | Status: AC | PRN
  Administered 2021-11-11: 500 [IU]

## 2021-11-11 MED ORDER — SODIUM CHLORIDE 0.9 % IV SOLN
10.0000 mg | Freq: Once | INTRAVENOUS | Status: AC
  Administered 2021-11-11: 10 mg via INTRAVENOUS
  Filled 2021-11-11: qty 1

## 2021-11-11 MED ORDER — SODIUM CHLORIDE 0.9% FLUSH
10.0000 mL | INTRAVENOUS | Status: DC | PRN
  Administered 2021-11-11: 10 mL

## 2021-11-11 MED ORDER — SODIUM CHLORIDE 0.9 % IV SOLN: Freq: Once | INTRAVENOUS | Status: AC

## 2021-11-11 MED ORDER — MEGESTROL ACETATE 400 MG/10ML PO SUSP: 400.0000 mg | Freq: Two times a day (BID) | ORAL | 3 refills | Status: DC

## 2021-11-11 MED ORDER — MAGNESIUM SULFATE 2 GM/50ML IV SOLN
2.0000 g | Freq: Once | INTRAVENOUS | Status: AC
Start: 2021-11-11 — End: 2021-11-11
  Administered 2021-11-11: 2 g via INTRAVENOUS
  Filled 2021-11-11: qty 50

## 2021-11-11 NOTE — Progress Notes (Signed)
Patient presents today for treatment. ALT 103 and Magnesium 1.6, Dr. Delton Coombes made aware, okay to proceed with treatment with an additional order of 2g of magnesium IV.  ?Patient tolerated chemotherapy with no complaints voiced. Side effects with management reviewed understanding verbalized. Port site clean and dry with no bruising or swelling noted at site. Good blood return noted before and after administration of chemotherapy. Band aid applied. Patient left in satisfactory condition with VSS and no s/s of distress noted.  ?

## 2021-11-11 NOTE — Progress Notes (Signed)
? ?[facility] ?[address] ?[place], [place] [address] ? ? ?CLINIC:  ?Medical Oncology/Hematology ? ?PCP:  ?Andrew Fraise, MD ?[address] [place] [address] ?[phone] ? ? 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 ?PRIOR THERAPY: neoadjuvant hormonal therapy, EBRT followed by brachytherapy with iodine 125 seeds in 2010 ? ?NGS Results: could not be done because of less than 100 tumor cells ? ?CURRENT THERAPY: Lupron injection (started 08/10/2019) and enzalutamide 160 mg daily (started 04/17/2020) ? ?BRIEF ONCOLOGIC HISTORY:  ?Oncology History  ?Prostate cancer ([place])  ?04/07/2014 Initial Diagnosis  ? Prostate cancer metastatic to intrapelvic lymph node ([place]) ?  ? Genetic Testing  ? [facility] Multi-Cancer Panel was Negative. Report date is 05/07/2021. ? ?The Multi-Cancer + RNA Panel offered by [facility] includes sequencing and/or deletion/duplication analysis of the following 84 genes:  AIP*, ALK, APC*, ATM*, AXIN2*, BAP1*, BARD1*, BLM*, BMPR1A*, BRCA1*, BRCA2*, BRIP1*, CASR, CDC73*, CDH1*, CDK4, CDKN1B*, CDKN1C*, CDKN2A, CEBPA, CHEK2*, CTNNA1*, DICER1*, DIS3L2*, EGFR, EPCAM, FH*, FLCN*, GATA2*, GPC3, GREM1, HOXB13, HRAS, KIT, MAX*, MEN1*, MET, MITF, MLH1*, MSH2*, MSH3*, MSH6*, MUTYH*, NBN*, NF1*, NF2*, NTHL1*, PALB2*, PDGFRA, PHOX2B, PMS2*, POLD1*, POLE*, POT1*, PRKAR1A*, PTCH1*, PTEN*, RAD50*, RAD51C*, RAD51D*, RB1*, RECQL4, RET, RUNX1*, SDHA*, SDHAF2*, SDHB*, SDHC*, SDHD*, SMAD4*, SMARCA4*, SMARCB1*, SMARCE1*, STK11*, SUFU*, TERC, TERT, TMEM127*, Tp53*, TSC1*, TSC2*, VHL*, WRN*, and WT1.  RNA analysis is performed for * genes. ?  ?10/21/2021 -  Chemotherapy  ? Patient is on Treatment Plan : PROSTATE Docetaxel + Prednisone q21d  ? ?   ? ? ?CANCER STAGING: ? Cancer Staging  ?Prostate cancer ([place]) ?Staging form: Prostate, [name] 7th Edition ?- Clinical stage from 04/02/2021: rT2b, N1, PSA: 10 to 19, Gleason 7 - Unsigned ? ? ?INTERVAL  HISTORY:  ?[name], a 76 y.o. male, returns for routine follow-up and consideration for next cycle of chemotherapy. Andrew [name] was last seen on 10/21/2021. ? ?Due for cycle #2 of Taxotere today.  ? ?Overall, he tells me he has been feeling pretty well. He has lost 13 lbs since 4/24. His appetite is poor, and he reports increased sleepiness. He denies nausea, vomiting, and tingling/numbness. He reports mild diarrhea. He is drinking 1-2 Ensure daily, and he is only eating one small portion of solid food daily such as an egg. He spends most of the day sleeping. He denies mouth sores. He denies ankle swellings ? ?Overall, he feels ready for next cycle of chemo today.  ? ? ?REVIEW OF SYSTEMS:  ?Review of Systems  ?Constitutional:  Positive for appetite change, fatigue and unexpected weight change (-13 lbs).  ?HENT:   Negative for mouth sores.   ?Cardiovascular:  Negative for leg swelling.  ?Gastrointestinal:  Positive for diarrhea. Negative for nausea and vomiting.  ?Neurological:  Positive for headaches. Negative for numbness.  ?All other systems reviewed and are negative. ? ?PAST MEDICAL/SURGICAL HISTORY:  ?Past Medical History:  ?Diagnosis Date  ? AKI (acute [name] injury) ([place]) 10/04/2021  ? Anemia   ? BPH (benign prostatic hyperplasia)   ? Sees Dr Andrew [name]  ? Cataract   ? Diabetes mellitus without complication ([place])   ? Educated about COVID-19 virus infection 07/18/2019  ? Genetic testing 05/09/2021  ? [facility] Multi-Cancer Panel was Negative. Report date is 05/07/2021.  The Multi-Cancer + RNA Panel offered by [facility] includes sequencing and/or deletion/duplication analysis of the following 84 genes:  AIP*, ALK, APC*, ATM*, AXIN2*, BAP1*, BARD1*, BLM*, BMPR1A*, BRCA1*, BRCA2*,  BRIP1*, CASR, CDC73*, CDH1*, CDK4, CDKN1B*, CDKN1C*, CDKN2A, CEBPA, CHEK2*, CTNNA1*, DICER1*, DIS3L2*, EGFR, EPCAM, FH*, F  ? Hyperlipidemia   ? Hypertension   ? Low serum vitamin D   ? Port-A-Cath in place 10/09/2021  ? Prostate cancer [facility]) 2008   ? w/seed implantation and radiation  ? ?Past Surgical History:  ?Procedure Laterality Date  ? COLONOSCOPY  [id number]  ? PORTACATH PLACEMENT Right 10/17/2021  ? Procedure: INSERTION PORT-A-CATH, R IJ;  Surgeon: Andrew Aus, DO;  Location: [name];  Service: General;  Laterality: Right;  pt needs to have glucose checked AM of surgery  ? PROSTATE SURGERY  2008  ? seed implant  ? ? ?SOCIAL HISTORY:  ?Social History  ? ?Socioeconomic History  ? Marital status: Married  ?  Spouse name: Andrew [name]  ? Number of children: 2  ? Years of education: 52  ? Highest education level: 12th grade  ?Occupational History  ? Occupation: [employer]  ?  Comment: Retired Geologist, engineering  ?Tobacco Use  ? Smoking status: Every Day  ?  Packs/day: 1.00  ?  Years: 55.00  ?  Pack years: 55.00  ?  Types: Cigarettes  ?  Start date: 06/30/1965  ? Smokeless tobacco: Never  ? Tobacco comments:  ?  Has tried nicotine replacement gum  ?Vaping Use  ? Vaping Use: Never used  ?Substance and Sexual Activity  ? Alcohol use: No  ? Drug use: No  ? Sexual activity: Yes  ?Other Topics Concern  ? Not on file  ?Social History Narrative  ? Retired, lives at home with wife Andrew [name], two grown children, enjoys gardening    ? ?Social Determinants of Health  ? ?Financial Resource Strain: Low Risk   ? Difficulty of Paying Living Expenses: Not hard at all  ?Food Insecurity: No Food Insecurity  ? Worried About Charity fundraiser in the Last Year: Never true  ? Ran Out of Food in the Last Year: Never true  ?Transportation Needs: No Transportation Needs  ? Lack of Transportation (Medical): No  ? Lack of Transportation (Non-Medical): No  ?Physical Activity: Inactive  ? Days of Exercise per Week: 0 days  ? Minutes of Exercise per Session: 0 min  ?Stress: No Stress Concern Present  ? Feeling of Stress : Not at all  ?Social Connections: Socially Integrated  ? Frequency of Communication with Friends and Family: More than three times a week  ? Frequency of Social Gatherings  with Friends and Family: More than three times a week  ? Attends Religious Services: More than 4 times per year  ? Active Member of Clubs or Organizations: Yes  ? Attends Archivist Meetings: More than 4 times per year  ? Marital Status: Married  ?Intimate Partner Violence: Not At Risk  ? Fear of Current or Ex-Partner: No  ? Emotionally Abused: No  ? Physically Abused: No  ? Sexually Abused: No  ? ? ?FAMILY HISTORY:  ?Family History  ?Problem Relation Age of Onset  ? Diabetes Mother   ? Heart disease Mother   ? [name] disease Mother   ?     DIALYSIS  ? Emphysema Father   ? Deep vein thrombosis Sister   ? [name] disease Sister   ? Lung cancer Sister   ? Colon polyps Brother   ? Hypertension Brother   ? Gout Brother   ? Colon cancer Neg Hx   ? Esophageal cancer Neg Hx   ? Rectal cancer Neg Hx   ?  Prostate cancer Neg Hx   ? ? ?CURRENT MEDICATIONS:  ?Current Outpatient Medications  ?Medication Sig Dispense Refill  ? alfuzosin (UROXATRAL) 10 MG 24 hr tablet Take 1 tablet (10 mg total) by mouth at bedtime. 90 tablet 3  ? aspirin 81 MG tablet Take 1 tablet (81 mg total) by mouth daily with breakfast. 30 tablet 3  ? atorvastatin (LIPITOR) 40 MG tablet Take 1 tablet (40 mg total) by mouth daily. (Patient taking differently: Take 40 mg by mouth at bedtime.) 90 tablet 3  ? azelastine (ASTELIN) 0.1 % nasal spray Place 1 spray into both nostrils 2 (two) times daily as needed for allergies.    ? [place] 5-2.5-18.5 LF-MCG/0.5 injection     ? cholecalciferol (VITAMIN D) 1000 UNITS tablet Take 1,000 Units by mouth daily.     ? Dapagliflozin-sAXagliptin (QTERN) 10-5 MG TABS Take 1 tablet by mouth in the morning. 90 tablet 5  ? DOCEtaxel (TAXOTERE IV) Inject into the vein every 21 ( twenty-one) days.    ? docusate sodium (COLACE) 100 MG capsule Take 1 capsule (100 mg total) by mouth 2 (two) times daily. 60 capsule 2  ? escitalopram (LEXAPRO) 10 MG tablet Take 1 tablet (10 mg total) by mouth daily. 90 tablet 1  ? glucose  blood test strip 1 each by Other route as needed for other. Use as instructed to test blood sugar daily.  One Touch Verio Test Strips DX: E11.65 100 each 11  ? lidocaine-prilocaine (EMLA) cream Apply a small a

## 2021-11-11 NOTE — Patient Instructions (Signed)
[place] at [facility] ?Discharge Instructions ? ? ?You were seen and examined today by Dr. Delton Coombes. ? ?We will proceed with your treatment today. ? ?We sent a prescription to your pharmacy for an appetite stimulant called Megace.  Take as prescribed. We will also refer you to our dietician.  ? ?Return as scheduled in 3 weeks.  ? ? ?Thank you for choosing [place] at [facility] to provide your oncology and hematology care.  To afford each patient quality time with our provider, please arrive at least 15 minutes before your scheduled appointment time.  ? ?If you have a lab appointment with the [facility] please come in thru the Main Entrance and check in at the main information desk. ? ?You need to re-schedule your appointment should you arrive 10 or more minutes late.  We strive to give you quality time with our providers, and arriving late affects you and other patients whose appointments are after yours.  Also, if you no show three or more times for appointments you may be dismissed from the clinic at the providers discretion.     ?Again, thank you for choosing [facility].  Our hope is that these requests will decrease the amount of time that you wait before being seen by our physicians.       ?_____________________________________________________________ ? ?Should you have questions after your visit to [facility], please contact our office at [phone] and follow the prompts.  Our office hours are 8:00 a.m. and 4:30 p.m. Monday - Friday.  Please note that voicemails left after 4:00 p.m. may not be returned until the following business day.  We are closed weekends and major holidays.  You do have access to a nurse 24-7, just call the main number to the clinic [phone] and do not press any options, hold on the line and a nurse will answer the phone.   ? ?For prescription refill requests, have your pharmacy contact our  office and allow 72 hours.   ? ?Due to Covid, you will need to wear a mask upon entering the hospital. If you do not have a mask, a mask will be given to you at the Main Entrance upon arrival. For doctor visits, patients may have 1 support person age 1 or older with them. For treatment visits, patients can not have anyone with them due to social distancing guidelines and our immunocompromised population.  ? ?   ?

## 2021-11-11 NOTE — Patient Instructions (Signed)
[facility]  Discharge Instructions: ?Thank you for choosing [facility] to provide your oncology and hematology care.  ?If you have a lab appointment with the [facility], please come in thru the Main Entrance and check in at the main information desk. ? ?Wear comfortable clothing and clothing appropriate for easy access to any Portacath or PICC line.  ? ?We strive to give you quality time with your provider. You may need to reschedule your appointment if you arrive late (15 or more minutes).  Arriving late affects you and other patients whose appointments are after yours.  Also, if you miss three or more appointments without notifying the office, you may be dismissed from the clinic at the provider?s discretion.    ?  ?For prescription refill requests, have your pharmacy contact our office and allow 72 hours for refills to be completed.   ? ?Today you received the following chemotherapy and/or immunotherapy agents Taxol and 2g of Magnesium sulfate, return as scheduled. ?  ?To help prevent nausea and vomiting after your treatment, we encourage you to take your nausea medication as directed. ? ?BELOW ARE SYMPTOMS THAT SHOULD BE REPORTED IMMEDIATELY: ?*FEVER GREATER THAN 100.4 F (38 ?C) OR HIGHER ?*CHILLS OR SWEATING ?*NAUSEA AND VOMITING THAT IS NOT CONTROLLED WITH YOUR NAUSEA MEDICATION ?*UNUSUAL SHORTNESS OF BREATH ?*UNUSUAL BRUISING OR BLEEDING ?*URINARY PROBLEMS (pain or burning when urinating, or frequent urination) ?*BOWEL PROBLEMS (unusual diarrhea, constipation, pain near the anus) ?TENDERNESS IN MOUTH AND THROAT WITH OR WITHOUT PRESENCE OF ULCERS (sore throat, sores in mouth, or a toothache) ?UNUSUAL RASH, SWELLING OR PAIN  ?UNUSUAL VAGINAL DISCHARGE OR ITCHING  ? ?Items with * indicate a potential emergency and should be followed up as soon as possible or go to the Emergency Department if any problems should occur. ? ?Please show the CHEMOTHERAPY ALERT CARD or IMMUNOTHERAPY  ALERT CARD at check-in to the Emergency Department and triage nurse. ? ?Should you have questions after your visit or need to cancel or reschedule your appointment, please contact [facility] [phone]  and follow the prompts.  Office hours are 8:00 a.m. to 4:30 p.m. Monday - Friday. Please note that voicemails left after 4:00 p.m. may not be returned until the following business day.  We are closed weekends and major holidays. You have access to a nurse at all times for urgent questions. Please call the main number to the clinic [phone] and follow the prompts. ? ?For any non-urgent questions, you may also contact your provider using MyChart. We now offer e-Visits for anyone 42 and older to request care online for non-urgent symptoms. For details visit [email or web address]. ?  ?Also download the MyChart app! Go to the app store, search "MyChart", open the app, select [facility], and log in with your MyChart username and password. ? ?Due to Covid, a mask is required upon entering the hospital/clinic. If you do not have a mask, one will be given to you upon arrival. For doctor visits, patients may have 1 support person aged 89 or older with them. For treatment visits, patients cannot have anyone with them due to current Covid guidelines and our immunocompromised population.  ?

## 2021-11-11 NOTE — Progress Notes (Signed)
Ok to treat with today's labs - dose decrease for docetaxel today. ? ?Mag sulfate 2 gm x 1 for Mag level 1.6 ? ?T.O. Dr Rhys Martini, PharmD ?

## 2021-11-13 ENCOUNTER — Inpatient Hospital Stay (HOSPITAL_COMMUNITY): Payer: Medicare HMO | Admitting: Licensed Clinical Social Worker

## 2021-11-13 ENCOUNTER — Inpatient Hospital Stay (HOSPITAL_COMMUNITY): Payer: Medicare HMO

## 2021-11-13 VITALS — BP 100/49 | HR 108 | Temp 96.3°F | Resp 20

## 2021-11-13 DIAGNOSIS — Z79899 Other long term (current) drug therapy: Secondary | ICD-10-CM | POA: Diagnosis not present

## 2021-11-13 DIAGNOSIS — Z5189 Encounter for other specified aftercare: Secondary | ICD-10-CM | POA: Diagnosis not present

## 2021-11-13 DIAGNOSIS — Z5111 Encounter for antineoplastic chemotherapy: Secondary | ICD-10-CM | POA: Diagnosis not present

## 2021-11-13 DIAGNOSIS — C775 Secondary and unspecified malignant neoplasm of intrapelvic lymph nodes: Secondary | ICD-10-CM | POA: Diagnosis not present

## 2021-11-13 DIAGNOSIS — Z7982 Long term (current) use of aspirin: Secondary | ICD-10-CM | POA: Diagnosis not present

## 2021-11-13 DIAGNOSIS — E860 Dehydration: Secondary | ICD-10-CM | POA: Diagnosis not present

## 2021-11-13 DIAGNOSIS — C61 Malignant neoplasm of prostate: Secondary | ICD-10-CM

## 2021-11-13 DIAGNOSIS — Z95828 Presence of other vascular implants and grafts: Secondary | ICD-10-CM

## 2021-11-13 DIAGNOSIS — R69 Illness, unspecified: Secondary | ICD-10-CM | POA: Diagnosis not present

## 2021-11-13 MED ORDER — PEGFILGRASTIM-JMDB 6 MG/0.6ML ~~LOC~~ SOSY
6.0000 mg | PREFILLED_SYRINGE | Freq: Once | SUBCUTANEOUS | Status: AC
  Administered 2021-11-13: 6 mg via SUBCUTANEOUS
  Filled 2021-11-13: qty 0.6

## 2021-11-13 NOTE — Patient Instructions (Signed)
[facility]  Discharge Instructions: Thank you for choosing [facility] to provide your oncology and hematology care.  If you have a lab appointment with the Cancer Center, please come in thru the Main Entrance and check in at the main information desk.  Wear comfortable clothing and clothing appropriate for easy access to any Portacath or PICC line.   We strive to give you quality time with your provider. You may need to reschedule your appointment if you arrive late (15 or more minutes).  Arriving late affects you and other patients whose appointments are after yours.  Also, if you miss three or more appointments without notifying the office, you may be dismissed from the clinic at the provider's discretion.      For prescription refill requests, have your pharmacy contact our office and allow 72 hours for refills to be completed.    Today you received Udenyca injection.     BELOW ARE SYMPTOMS THAT SHOULD BE REPORTED IMMEDIATELY: *FEVER GREATER THAN 100.4 F (38 C) OR HIGHER *CHILLS OR SWEATING *NAUSEA AND VOMITING THAT IS NOT CONTROLLED WITH YOUR NAUSEA MEDICATION *UNUSUAL SHORTNESS OF BREATH *UNUSUAL BRUISING OR BLEEDING *URINARY PROBLEMS (pain or burning when urinating, or frequent urination) *BOWEL PROBLEMS (unusual diarrhea, constipation, pain near the anus) TENDERNESS IN MOUTH AND THROAT WITH OR WITHOUT PRESENCE OF ULCERS (sore throat, sores in mouth, or a toothache) UNUSUAL RASH, SWELLING OR PAIN  UNUSUAL VAGINAL DISCHARGE OR ITCHING   Items with * indicate a potential emergency and should be followed up as soon as possible or go to the Emergency Department if any problems should occur.  Please show the CHEMOTHERAPY ALERT CARD or IMMUNOTHERAPY ALERT CARD at check-in to the Emergency Department and triage nurse.  Should you have questions after your visit or need to cancel or reschedule your appointment, please contact [facility]  [phone]  and follow the prompts.  Office hours are 8:00 a.m. to 4:30 p.m. Monday - Friday. Please note that voicemails left after 4:00 p.m. may not be returned until the following business day.  We are closed weekends and major holidays. You have access to a nurse at all times for urgent questions. Please call the main number to the clinic [phone] and follow the prompts.  For any non-urgent questions, you may also contact your provider using MyChart. We now offer e-Visits for anyone 18 and older to request care online for non-urgent symptoms. For details visit [email or web address].   Also download the MyChart app! Go to the app store, search "MyChart", open the app, select [facility], and log in with your MyChart username and password.  Due to Covid, a mask is required upon entering the hospital/clinic. If you do not have a mask, one will be given to you upon arrival. For doctor visits, patients may have 1 support person aged 18 or older with them. For treatment visits, patients cannot have anyone with them due to current Covid guidelines and our immunocompromised population.  

## 2021-11-13 NOTE — Progress Notes (Signed)
[facility] Clinical Social Work  ?Initial Assessment ? ? ?Andrew [name] is a 76 y.o. year old male presenting alone. Clinical Social Work was referred by medical provider for assessment of psychosocial needs.  ? ?SDOH (Social Determinants of Health) assessments performed: Yes ?  ?SDOH Screenings  ? ?Alcohol Screen: Low Risk   ? Last Alcohol Screening Score (AUDIT): 0  ?Depression (PHQ2-9): Medium Risk  ? PHQ-2 Score: 11  ?Financial Resource Strain: Low Risk   ? Difficulty of Paying Living Expenses: Not hard at all  ?Food Insecurity: No Food Insecurity  ? Worried About Charity fundraiser in the Last Year: Never true  ? Ran Out of Food in the Last Year: Never true  ?Housing: Low Risk   ? Last Housing Risk Score: 0  ?Physical Activity: Inactive  ? Days of Exercise per Week: 0 days  ? Minutes of Exercise per Session: 0 min  ?Social Connections: Socially Integrated  ? Frequency of Communication with Friends and Family: More than three times a week  ? Frequency of Social Gatherings with Friends and Family: More than three times a week  ? Attends Religious Services: More than 4 times per year  ? Active Member of Clubs or Organizations: Yes  ? Attends Archivist Meetings: More than 4 times per year  ? Marital Status: Married  ?Stress: No Stress Concern Present  ? Feeling of Stress : Not at all  ?Tobacco Use: High Risk  ? Smoking Tobacco Use: Every Day  ? Smokeless Tobacco Use: Never  ? Passive Exposure: Not on file  ?Transportation Needs: No Transportation Needs  ? Lack of Transportation (Medical): No  ? Lack of Transportation (Non-Medical): No  ? ? ? ?Distress Screen completed: No ?   ? View : No data to display.  ?  ?  ?  ? ? ? ? ?Family/Social Information:  ?Housing Arrangement: patient lives with his spouse,  Andrew [name].  The couple has 2 adult children who reside locally. ?Family members/support persons in your life? Pt's spouse is independent in ADLs and provides transport to and from treatment and appointments for pt.   Pt's adult children also assist w/ care as needed.  Pt states he attends church and has a number of friends in his congregation who are also willing to help if needed.   ?Transportation concerns: no  ?Employment: Retired Pt retired from work at the age of 65.  Pt worked as a Geologist, engineering.  ?Income source: [place] ?Financial concerns:  Pt states they are able to take care of regularly expected bills; however, pt does need to have some dental work done and will not be able to afford the out of pocket expense.   ?Type of concern:  dental bill ?Food access concerns: no ?Religious or spiritual practice: Yes-pt regularly attends church ?Services Currently in place:  No services in place at this time. ? ?Coping/ Adjustment to diagnosis: ?Patient understands treatment plan and what happens next? yes ?Concerns about diagnosis and/or treatment: Quality of life ?Patient reported stressors:  no stressors reported at this time. ?Hopes and/or priorities: Pt's priority is to continue treatment w/ the hope of an improved quality of life as he is very tired and physically deconditioned at present. ?Patient enjoys gardening ?Current coping skills/ strengths: Supportive family/friends  ? ? ? SUMMARY: ?Current SDOH Barriers:  ?Financial constraints related to needed dental work. ? ?Clinical Social Work Clinical Goal(s):  ?Freight forwarder options for unmet needs related to:  Financial Strain  ? ?Interventions: ?Discussed common feeling and emotions when being diagnosed with cancer, and the importance of support during treatment ?Informed patient of the support team roles and support services at [facility] ?Provided CSW contact information and encouraged patient to call with any questions or concerns ?Referred patient to Duanne Limerick for financial assistance ? ? ?Follow Up Plan: Patient will contact CSW with any support or resource needs ?Patient verbalizes understanding of plan: Yes ? ? ? ?Henriette Combs,  LCSW ? ? ?  ?

## 2021-11-13 NOTE — Progress Notes (Signed)
Andrew [name] presents today for injection per the provider's orders.  Fulphila administration without incident; injection site WNL; see MAR for injection details.  Patient tolerated procedure well and without incident.  No questions or complaints noted at this time.  ? ?Discharged from clinic ambulatory in stable condition. Alert and oriented x 3. F/U with [facility] as scheduled.   ?

## 2021-11-19 DIAGNOSIS — B351 Tinea unguium: Secondary | ICD-10-CM | POA: Diagnosis not present

## 2021-11-19 DIAGNOSIS — E1142 Type 2 diabetes mellitus with diabetic polyneuropathy: Secondary | ICD-10-CM | POA: Diagnosis not present

## 2021-11-19 DIAGNOSIS — M79676 Pain in unspecified toe(s): Secondary | ICD-10-CM | POA: Diagnosis not present

## 2021-11-19 DIAGNOSIS — L84 Corns and callosities: Secondary | ICD-10-CM | POA: Diagnosis not present

## 2021-11-20 DIAGNOSIS — E1165 Type 2 diabetes mellitus with hyperglycemia: Secondary | ICD-10-CM | POA: Diagnosis not present

## 2021-11-27 DIAGNOSIS — Z7984 Long term (current) use of oral hypoglycemic drugs: Secondary | ICD-10-CM

## 2021-11-27 DIAGNOSIS — I10 Essential (primary) hypertension: Secondary | ICD-10-CM

## 2021-11-27 DIAGNOSIS — E1169 Type 2 diabetes mellitus with other specified complication: Secondary | ICD-10-CM

## 2021-12-02 ENCOUNTER — Inpatient Hospital Stay (HOSPITAL_COMMUNITY): Payer: Medicare HMO

## 2021-12-02 ENCOUNTER — Inpatient Hospital Stay (HOSPITAL_COMMUNITY): Payer: Medicare HMO | Admitting: Dietician

## 2021-12-02 ENCOUNTER — Inpatient Hospital Stay (HOSPITAL_BASED_OUTPATIENT_CLINIC_OR_DEPARTMENT_OTHER): Payer: Medicare HMO | Admitting: Hematology

## 2021-12-02 ENCOUNTER — Encounter (HOSPITAL_COMMUNITY): Payer: Self-pay

## 2021-12-02 ENCOUNTER — Inpatient Hospital Stay (HOSPITAL_COMMUNITY): Payer: Medicare HMO | Attending: Physician Assistant

## 2021-12-02 VITALS — BP 137/61 | HR 65 | Temp 96.1°F | Resp 18

## 2021-12-02 VITALS — BP 103/64 | HR 80 | Temp 98.3°F | Resp 16 | Wt 178.5 lb

## 2021-12-02 DIAGNOSIS — Z7982 Long term (current) use of aspirin: Secondary | ICD-10-CM | POA: Diagnosis not present

## 2021-12-02 DIAGNOSIS — Z7984 Long term (current) use of oral hypoglycemic drugs: Secondary | ICD-10-CM | POA: Insufficient documentation

## 2021-12-02 DIAGNOSIS — D509 Iron deficiency anemia, unspecified: Secondary | ICD-10-CM

## 2021-12-02 DIAGNOSIS — D649 Anemia, unspecified: Secondary | ICD-10-CM | POA: Diagnosis present

## 2021-12-02 DIAGNOSIS — F17210 Nicotine dependence, cigarettes, uncomplicated: Secondary | ICD-10-CM | POA: Diagnosis not present

## 2021-12-02 DIAGNOSIS — C7951 Secondary malignant neoplasm of bone: Secondary | ICD-10-CM | POA: Diagnosis not present

## 2021-12-02 DIAGNOSIS — Z79899 Other long term (current) drug therapy: Secondary | ICD-10-CM | POA: Insufficient documentation

## 2021-12-02 DIAGNOSIS — R69 Illness, unspecified: Secondary | ICD-10-CM | POA: Diagnosis not present

## 2021-12-02 DIAGNOSIS — R197 Diarrhea, unspecified: Secondary | ICD-10-CM | POA: Diagnosis not present

## 2021-12-02 DIAGNOSIS — C775 Secondary and unspecified malignant neoplasm of intrapelvic lymph nodes: Secondary | ICD-10-CM | POA: Insufficient documentation

## 2021-12-02 DIAGNOSIS — C61 Malignant neoplasm of prostate: Secondary | ICD-10-CM | POA: Insufficient documentation

## 2021-12-02 LAB — COMPREHENSIVE METABOLIC PANEL
ALT: 17 U/L (ref 0–44)
AST: 24 U/L (ref 15–41)
Albumin: 2.7 g/dL — ABNORMAL LOW (ref 3.5–5.0)
Alkaline Phosphatase: 76 U/L (ref 38–126)
Anion gap: 8 (ref 5–15)
BUN: 22 mg/dL (ref 8–23)
CO2: 20 mmol/L — ABNORMAL LOW (ref 22–32)
Calcium: 8.1 mg/dL — ABNORMAL LOW (ref 8.9–10.3)
Chloride: 107 mmol/L (ref 98–111)
Creatinine, Ser: 1.35 mg/dL — ABNORMAL HIGH (ref 0.61–1.24)
GFR, Estimated: 54 mL/min — ABNORMAL LOW (ref >60)
Glucose, Bld: 99 mg/dL (ref 70–99)
Potassium: 3.3 mmol/L — ABNORMAL LOW (ref 3.5–5.1)
Sodium: 135 mmol/L (ref 135–145)
Total Bilirubin: 0.6 mg/dL (ref 0.3–1.2)
Total Protein: 7.1 g/dL (ref 6.5–8.1)

## 2021-12-02 LAB — CBC WITH DIFFERENTIAL/PLATELET
Abs Immature Granulocytes: 0.05 10*3/uL (ref 0.00–0.07)
Basophils Absolute: 0.1 10*3/uL (ref 0.0–0.1)
Basophils Relative: 2 %
Eosinophils Absolute: 0.1 10*3/uL (ref 0.0–0.5)
Eosinophils Relative: 1 %
HCT: 26.3 % — ABNORMAL LOW (ref 39.0–52.0)
Hemoglobin: 8.8 g/dL — ABNORMAL LOW (ref 13.0–17.0)
Immature Granulocytes: 1 %
Lymphocytes Relative: 24 %
Lymphs Abs: 1.9 10*3/uL (ref 0.7–4.0)
MCH: 31.2 pg (ref 26.0–34.0)
MCHC: 33.5 g/dL (ref 30.0–36.0)
MCV: 93.3 fL (ref 80.0–100.0)
Monocytes Absolute: 0.6 10*3/uL (ref 0.1–1.0)
Monocytes Relative: 8 %
Neutro Abs: 5.2 10*3/uL (ref 1.7–7.7)
Neutrophils Relative %: 64 %
Platelets: 312 10*3/uL (ref 150–400)
RBC: 2.82 MIL/uL — ABNORMAL LOW (ref 4.22–5.81)
RDW: 14.6 % (ref 11.5–15.5)
WBC: 7.9 10*3/uL (ref 4.0–10.5)
nRBC: 0 % (ref 0.0–0.2)

## 2021-12-02 LAB — MAGNESIUM: Magnesium: 1.1 mg/dL — ABNORMAL LOW (ref 1.7–2.4)

## 2021-12-02 MED ORDER — ACETAMINOPHEN 325 MG PO TABS
650.0000 mg | ORAL_TABLET | Freq: Once | ORAL | Status: AC
  Administered 2021-12-02: 650 mg via ORAL

## 2021-12-02 MED ORDER — DRONABINOL 5 MG PO CAPS: 5.0000 mg | ORAL_CAPSULE | Freq: Two times a day (BID) | ORAL | 3 refills | Status: DC

## 2021-12-02 MED ORDER — SODIUM CHLORIDE 0.9 % IV SOLN
300.0000 mg | Freq: Once | INTRAVENOUS | Status: AC
  Administered 2021-12-02: 300 mg via INTRAVENOUS
  Filled 2021-12-02: qty 300

## 2021-12-02 MED ORDER — SODIUM CHLORIDE 0.9 % IV SOLN: Freq: Once | INTRAVENOUS | Status: AC

## 2021-12-02 MED ORDER — LORATADINE 10 MG PO TABS
ORAL_TABLET | ORAL | Status: AC
  Filled 2021-12-02: qty 1

## 2021-12-02 MED ORDER — ACETAMINOPHEN 325 MG PO TABS
ORAL_TABLET | ORAL | Status: AC
  Filled 2021-12-02: qty 2

## 2021-12-02 MED ORDER — HEPARIN SOD (PORK) LOCK FLUSH 100 UNIT/ML IV SOLN
500.0000 [IU] | Freq: Once | INTRAVENOUS | Status: AC
  Administered 2021-12-02: 500 [IU] via INTRAVENOUS

## 2021-12-02 MED ORDER — SODIUM CHLORIDE 0.9% FLUSH
10.0000 mL | Freq: Once | INTRAVENOUS | Status: AC
  Administered 2021-12-02: 10 mL via INTRAVENOUS

## 2021-12-02 MED ORDER — MAGNESIUM OXIDE -MG SUPPLEMENT 400 (240 MG) MG PO TABS: 400.0000 mg | ORAL_TABLET | Freq: Two times a day (BID) | ORAL | 2 refills | Status: DC

## 2021-12-02 MED ORDER — LORATADINE 10 MG PO TABS
10.0000 mg | ORAL_TABLET | Freq: Once | ORAL | Status: AC
  Administered 2021-12-02: 10 mg via ORAL

## 2021-12-02 MED ORDER — MAGNESIUM SULFATE 2 GM/50ML IV SOLN
INTRAVENOUS | Status: AC
  Filled 2021-12-02: qty 50

## 2021-12-02 MED ORDER — MAGNESIUM SULFATE 2 GM/50ML IV SOLN
2.0000 g | INTRAVENOUS | Status: AC
  Administered 2021-12-02 (×2): 2 g via INTRAVENOUS

## 2021-12-02 MED ORDER — PRAMOXINE HCL (PERIANAL) 1 % EX FOAM: 1.0000 "application " | Freq: Three times a day (TID) | CUTANEOUS | 0 refills | Status: DC | PRN

## 2021-12-02 NOTE — Progress Notes (Signed)
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 Report date is 05/07/2021.  The Multi-Cancer + RNA Panel offered by [facility] includes sequencing and/or deletion/duplication analysis of the following 84 genes:  AIP*, ALK, APC*, ATM*, AXIN2*, BAP1*, BARD1*, BLM*, BMPR1A*, BRCA1*, BRCA2*, BRIP1*, CASR, CDC73*, CDH1*, CDK4, CDKN1B*, CDKN1C*, CDKN2A, CEBPA, CHEK2*, CTNNA1*, DICER1*, DIS3L2*, EGFR, EPCAM, FH*, FLCN*, GATA2*, GPC3, GREM1, HOXB13, HRAS, KIT, MAX*, MEN1*, MET, MITF, MLH1*, MSH2*, MSH3*, MSH6*, MUTYH*, NBN*, NF1*, NF2*, NTHL1*, PALB2*, PDGFRA, PHOX2B, PMS2*, POLD1*, POLE*, POT1*, PRKAR1A*, PTCH1*, PTEN*, RAD50*, RAD51C*, RAD51D*, RB1*, RECQL4, RET, RUNX1*, SDHA*, SDHAF2*, SDHB*, SDHC*, SDHD*, SMAD4*, SMARCA4*, SMARCB1*, SMARCE1*, STK11*, SUFU*, TERC, TERT, TMEM127*, Tp53*, TSC1*, TSC2*, VHL*, WRN*, and WT1.  RNA analysis is performed for * genes.   10/21/2021 -  Chemotherapy   Patient is on Treatment Plan : PROSTATE Docetaxel + Prednisone q21d        CANCER STAGING:  Cancer Staging  Prostate cancer [facility]) Staging form: Prostate, [name] 7th Edition - Clinical stage from 04/02/2021: rT2b, N1, PSA: 10 to 19, Gleason 7 - Unsigned   INTERVAL  HISTORY:  [name], a 76 y.o. male, returns for routine follow-up and consideration for next cycle of chemotherapy. Tuvia was last seen on 11/11/2021.  Due for cycle #3 of Taxotere today.   Overall, he tells me he has been feeling pretty well. He has lost 7 lbs since his last visit. He took Megace for 2 days and then stopped it as he reports it caused diarrhea; he continues to have occasional diarrhea. He reports rectal pain which started following the start of the diarrhea. He denies hemorrhoids. He drinks Boost/Ensure once a day or every other day. He is eating 1-2 meals daily. He denies tingling/numbness, nausea, vomiting, and ankle swellings.   Overall, he is not ready for his next cycle of chemo today.    REVIEW OF SYSTEMS:  Review of Systems  Constitutional:  Positive for unexpected weight change (-7 lbs). Negative for appetite change and fatigue.  Respiratory:  Positive for shortness of breath.   Cardiovascular:  Negative for leg swelling.  Gastrointestinal:  Positive for diarrhea and rectal pain. Negative for nausea and vomiting.  Neurological:  Positive for dizziness and headaches. Negative for numbness.  Psychiatric/Behavioral:  Positive for depression. The patient is nervous/anxious.   All other systems reviewed and are negative.  PAST MEDICAL/SURGICAL HISTORY:  Past Medical History:  Diagnosis Date   AKI (acute kidney injury) ([place]) 10/04/2021   Anemia    BPH (benign prostatic hyperplasia)    Sees Dr [name]   Cataract    Diabetes mellitus without complication [facility])    Educated about COVID-19 virus infection 07/18/2019   Genetic testing 05/09/2021   [facility] Multi-Cancer Panel was Negative. Report date is 05/07/2021.  The  Multi-Cancer + RNA Panel offered by [facility] includes sequencing and/or deletion/duplication analysis of the following 84 genes:  AIP*, ALK, APC*, ATM*, AXIN2*, BAP1*, BARD1*, BLM*, BMPR1A*, BRCA1*, BRCA2*, BRIP1*, CASR, CDC73*, CDH1*, CDK4, CDKN1B*,  CDKN1C*, CDKN2A, CEBPA, CHEK2*, CTNNA1*, DICER1*, DIS3L2*, EGFR, EPCAM, FH*, F   Hyperlipidemia    Hypertension    Low serum vitamin D    Port-A-Cath in place 10/09/2021   Prostate cancer [facility]) 2008   w/seed implantation and radiation   Past Surgical History:  Procedure Laterality Date   COLONOSCOPY  2015,2018   PORTACATH PLACEMENT Right 10/17/2021   Procedure: INSERTION PORT-A-CATH, [name];  Surgeon: [name], DO;  Location: [name];  Service: General;  Laterality: Right;  pt needs to have glucose checked AM of surgery   PROSTATE SURGERY  2008   seed implant    SOCIAL HISTORY:  Social History   Socioeconomic History   Marital status: Married    Spouse name: [place]fort   Number of children: 2   Years of education: 12   Highest education level: 12th grade  Occupational History   Occupation: [name]    Comment: Retired Geologist, engineering  Tobacco Use   Smoking status: Every Day    Packs/day: 1.00    Years: 55.00    Pack years: 55.00    Types: Cigarettes    Start date: 06/30/1965   Smokeless tobacco: Never   Tobacco comments:    Has tried nicotine replacement gum  Vaping Use   Vaping Use: Never used  Substance and Sexual Activity   Alcohol use: No   Drug use: No   Sexual activity: Yes  Other Topics Concern   Not on file  Social History Narrative   Retired, lives at home with wife [name], two grown children, enjoys gardening     Social Determinants of Radio broadcast assistant Strain: Low Risk    Difficulty of Paying Living Expenses: Not hard at all  Food Insecurity: No Food Insecurity   Worried About Charity fundraiser in the Last Year: Never true   Arboriculturist in the Last Year: Never true  Transportation Needs: No Transportation Needs   Lack of Transportation (Medical): No   Lack of Transportation (Non-Medical): No  Physical Activity: Inactive   Days of Exercise per Week: 0 days   Minutes of Exercise per Session: 0 min  Stress: No Stress Concern  Present   Feeling of Stress : Not at all  Social Connections: Socially Integrated   Frequency of Communication with Friends and Family: More than three times a week   Frequency of Social Gatherings with Friends and Family: More than three times a week   Attends Religious Services: More than 4 times per year   Active Member of [employer] or Organizations: Yes   Attends Music therapist: More than 4 times per year   Marital Status: Married  Human resources officer Violence: Not At Risk   Fear of Current or Ex-Partner: No   Emotionally Abused: No   Physically Abused: No   Sexually Abused: No    FAMILY HISTORY:  Family History  Problem Relation Age of Onset   Diabetes Mother    Heart disease Mother    Kidney disease Mother        DIALYSIS   Emphysema Father    Deep vein thrombosis Sister    Kidney disease Sister    Lung cancer Sister    Colon polyps Brother  Hypertension Brother    Gout Brother    Colon cancer Neg Hx    Esophageal cancer Neg Hx    Rectal cancer Neg Hx    Prostate cancer Neg Hx     CURRENT MEDICATIONS:  Current Outpatient Medications  Medication Sig Dispense Refill   alfuzosin (UROXATRAL) 10 MG 24 hr tablet Take 1 tablet (10 mg total) by mouth at bedtime. 90 tablet 3   ammonium lactate (AMLACTIN) 12 % cream APPLY TWICE DAILY TO DRY SKIN ON FEET     aspirin 81 MG tablet Take 1 tablet (81 mg total) by mouth daily with breakfast. 30 tablet 3   atorvastatin (LIPITOR) 40 MG tablet Take 1 tablet (40 mg total) by mouth daily. (Patient taking differently: Take 40 mg by mouth at bedtime.) 90 tablet 3   azelastine (ASTELIN) 0.1 % nasal spray Place 1 spray into both nostrils 2 (two) times daily as needed for allergies.     BOOSTRIX 5-2.5-18.5 LF-MCG/0.5 injection      cholecalciferol (VITAMIN D) 1000 UNITS tablet Take 1,000 Units by mouth daily.      Dapagliflozin-sAXagliptin (QTERN) 10-5 MG TABS Take 1 tablet by mouth in the morning. 90 tablet 5   DOCEtaxel  (TAXOTERE IV) Inject into the vein every 21 ( twenty-one) days.     docusate sodium (COLACE) 100 MG capsule Take 1 capsule (100 mg total) by mouth 2 (two) times daily. 60 capsule 2   dronabinol (MARINOL) 5 MG capsule Take 1 capsule (5 mg total) by mouth 2 (two) times daily before a meal. 60 capsule 3   escitalopram (LEXAPRO) 10 MG tablet Take 1 tablet (10 mg total) by mouth daily. 90 tablet 1   FARXIGA 10 MG TABS tablet Take 10 mg by mouth daily.     glimepiride (AMARYL) 2 MG tablet Take 2 mg by mouth 2 (two) times daily.     glucose blood test strip 1 each by Other route as needed for other. Use as instructed to test blood sugar daily.  One Touch Verio Test Strips DX: E11.65 100 each 11   lisinopril-hydrochlorothiazide (ZESTORETIC) 20-25 MG tablet Take 1 tablet by mouth daily. 90 tablet 3   magnesium oxide (MAG-OX) 400 (240 Mg) MG tablet Take 1 tablet (400 mg total) by mouth 2 (two) times daily. 60 tablet 2   metFORMIN (GLUCOPHAGE) 1000 MG tablet Take 1 tablet (1,000 mg total) by mouth 2 (two) times daily with a meal. 180 tablet 2   oxyCODONE (ROXICODONE) 5 MG immediate release tablet Take 1 tablet (5 mg total) by mouth every 8 (eight) hours as needed. 10 tablet 0   potassium chloride SA (KLOR-CON M) 20 MEQ tablet Take 1 tablet (20 mEq total) by mouth daily. 30 tablet 6   pramoxine (PROCTOFOAM) 1 % foam Place 1 application. rectally 3 (three) times daily as needed for anal itching. 15 g 0   SHINGRIX injection      lidocaine-prilocaine (EMLA) cream Apply a small amount to port a cath site and cover with plastic wrap 1 hour prior to chemotherapy appointments (Patient not taking: Reported on 12/02/2021) 30 g 3   prochlorperazine (COMPAZINE) 10 MG tablet Take 1 tablet (10 mg total) by mouth every 6 (six) hours as needed (Nausea or vomiting). (Patient not taking: Reported on 12/02/2021) 30 tablet 2   No current facility-administered medications for this visit.    ALLERGIES:  No Known  Allergies  PHYSICAL EXAM:  Performance status (ECOG): 1 - Symptomatic but completely ambulatory  Vitals:   12/02/21 0910  BP: 103/64  Pulse: 80  Resp: 16  Temp: 98.3 F (36.8 C)  SpO2: 100%   Wt Readings from Last 3 Encounters:  12/02/21 178 lb 8 oz (81 kg)  11/11/21 185 lb 10 oz (84.2 kg)  10/30/21 190 lb 11.2 oz (86.5 kg)   Physical Exam Vitals reviewed.  Constitutional:      Appearance: Normal appearance.  Cardiovascular:     Rate and Rhythm: Normal rate and regular rhythm.     Pulses: Normal pulses.     Heart sounds: Normal heart sounds.  Pulmonary:     Effort: Pulmonary effort is normal.     Breath sounds: Normal breath sounds.  Musculoskeletal:     Right lower leg: No edema.     Left lower leg: No edema.  Neurological:     General: No focal deficit present.     Mental Status: He is alert and oriented to person, place, and time.  Psychiatric:        Mood and Affect: Mood normal.        Behavior: Behavior normal.    LABORATORY DATA:  I have reviewed the labs as listed.     Latest Ref Rng & Units 12/02/2021    9:11 AM 11/11/2021    8:20 AM 10/30/2021   12:09 PM  CBC  WBC 4.0 - 10.5 K/uL 7.9   6.5   15.8    Hemoglobin 13.0 - 17.0 g/dL 8.8   8.7   10.9    Hematocrit 39.0 - 52.0 % 26.3   26.6   33.0    Platelets 150 - 400 K/uL 312   559   219        Latest Ref Rng & Units 12/02/2021    9:11 AM 11/11/2021    8:20 AM 10/30/2021   12:09 PM  CMP  Glucose 70 - 99 mg/dL 99   128   171    BUN 8 - 23 mg/dL '22   19   16    ' Creatinine 0.61 - 1.24 mg/dL 1.35   1.21   1.47    Sodium 135 - 145 mmol/L 135   134   138    Potassium 3.5 - 5.1 mmol/L 3.3   3.6   3.5    Chloride 98 - 111 mmol/L 107   100   101    CO2 22 - 32 mmol/L '20   25   26    ' Calcium 8.9 - 10.3 mg/dL 8.1   8.2   8.9    Total Protein 6.5 - 8.1 g/dL 7.1   6.6   7.0    Total Bilirubin 0.3 - 1.2 mg/dL 0.6   0.6   0.8    Alkaline Phos 38 - 126 U/L 76   83   83    AST 15 - 41 U/L 24   64   26    ALT 0 - 44  U/L 17   103   19      DIAGNOSTIC IMAGING:  I have independently reviewed the scans and discussed with the patient. No results found.   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  CTAP on 08/26/2017 shows enlarged  left pelvic sidewall lymph node 2.2 cm. - Lupron initiated on 08/10/2019. - CTAP on 03/09/2020 with left internal/external iliac lymph node measuring 2.1 x 2.8 cm (2.1 x 2.2 cm), craniocaudal 4.1 cm, previously 3.4 cm. - Enzalutamide to 160 mg daily started on 04/17/2020.  Discontinued end of October 2022. - PSA on 02/20/2021 (5.2), 07/11/2020 (2.0), 02/08/2020 (3.1) - CTAP on 03/07/2021 with left iliac bifurcation lymph node measuring 3.5 x 2.6 cm (previously 2.8 x 2.0 cm) - Bone scan on 03/07/2021 negative. - Left pelvic lymph node biopsy on 04/08/2021, metastatic prostate cancer. - We have reviewed PSMA PET scan results which showed enlarged left iliac lymph node 33 mm with SUV 285.  1 mm lymph node and 4 mm node between IVC and aorta.  Activity in the right acetabulum, left iliac bone, left transverse process of T11 vertebral body, spine of the right scapula. - Guardant 360 did not show any targetable mutations.  Did not show MSI high. - NGS testing could not be done because of less than 100 tumor cells. - Abiraterone and prednisone from 07/15/2021 through 10/01/2021 with progression. - Germline mutation [facility] testing was negative. - Cycle 1 of docetaxel on 10/21/2021.   PLAN:  Metastatic castration refractory prostate cancer to the left iliac lymph node: - He has tolerated dose reduced cycle 2 reasonably well but lost 7 more pounds. - Reviewed labs today which showed severely low magnesium of 1.1.   Potassium was low at 3.3.  We will give IV magnesium and start him on magnesium oxide twice daily. - Reports some pain in [place] around the perianal area from diarrhea.  We will give him Proctofoam to be applied twice daily. - Due to continued weight loss, I will hold off on chemotherapy today. - RTC 1 week for reevaluation and possible treatment.  2.  Weight loss: - He has lost 7 pounds since cycle 2.  He lost 13 pounds after cycle 1 amounting to a total of 20 pounds. - He took Megace for 2 days and thought it was causing diarrhea and stopped it. - He is drinking 1 can of boost daily to every other day.  He is eating 1 or 2 meals per day. - We will start him on Marinol 5 mg twice daily.  We will make a dietary referral.  3.  Normocytic anemia: - Hemoglobin is 8.8.  Ferritin is 62 and percent saturation 22. - Recommend Venofer x3.  Discussed side effects in detail.  4.  Bone metastasis: - We will start denosumab after dental evaluation cleared.   Orders placed this encounter:  No orders of the defined types were placed in this encounter.    Derek Jack, MD [place] [phone]   I, Thana Ates, am acting as a scribe for Dr. Derek Jack.  I, Derek Jack MD, have reviewed the above documentation for accuracy and completeness, and I agree with the above.

## 2021-12-02 NOTE — Progress Notes (Signed)
Nutrition Assessment   Reason for Assessment: Referral (wt loss)   ASSESSMENT: 76 year old male with prostate cancer metastatic to intrapelvic lymph node. He is currently receiving docetaxel + prednisone q21d (started 4/10). Patient is under the care of Dr. Delton Coombes.   Past medical history includes DM2, HTN, HLD  Treatment being held today, pt will receive IV magnesium + Venofer  Met with patient and family in clinic. He reports poor appetite. This has been ongoing since starting chemotherapy. Patient recalls typically eating one small meal daily. Yesterday he had 2 chicken tenders, stewed potatoes, brussels sprouts, dinner roll. He is drinking Boost at home, but not regularly. Patient reports a few episodes of diarrhea. This has resolved. He denies nausea, vomiting.   Nutrition Focused Physical Exam:   Orbital Region: moderate Buccal Region: mild  Upper Arm Region: Therapist, art and Lumbar Region: UTA Temple Region: mild Clavicle Bone Region: UTA Shoulder and Acromion Bone Region: UTA Scapular Bone Region: UTA Dorsal Hand: moderate Patellar Region: UTA Anterior Thigh Region: UTA Posterior Calf Region: UTA Edema (RD assessment): UTA Hair: reviewed  Eyes: reviewed  Mouth: poor dentition, missing teeth Skin: reviewed, dry Nails: reviewed    Medications: Marinol, Glimepiride, Lexapro, Roxicodone, Colace, Compazine, Metformin, Lipitor, Klor-con   Labs: Mg 1.1, K 3.3, Cr 1.35   Anthropometrics: Weights have decreased 6% one month. Noted 190 lb 11.2 oz on 5/03. Weights have decreased 27 lb (13%) from usual weight in the last 3 months. This is significant  Height: 5' 6.97" Weight: 178 lb 8 oz  UBW: 203 lb (per pt ~3 months ago, per chart 205 lb 12.8 oz on 08/26/21) BMI: 27.98   NUTRITION DIAGNOSIS: Patient meets criteria for severe malnutrition in the context of chronic disease (metastatic prostate cancer) as evidenced by poor appetite for >/= one month, mild/moderate fat  and muscle depletion, 13% weight loss in 3 months which is significant for time frame.    INTERVENTION:  Educated on importance of adequate calorie and protein energy intake to maintain strength/weights Discussed strategies for poor appetite, encouraged small frequent meals/snacks q2h - handout with tips for increasing calories as well as high calorie, high protein snack ideas provided Patient to start appetite stimulant (marinol) per MD Encouraged soft moist high protein foods for ease of intake Drink 2 Ensure Plus/equivalent daily One complimentary case of Ensure Plus High Protein provided  Educated on importance of oral care, suggested baking soda salt water rinses several times daily before meals Provided contact information    MONITORING, EVALUATION, GOAL: Patient will tolerate increased calories and protein to minimize further weight loss   Next Visit: Monday June 12

## 2021-12-02 NOTE — Progress Notes (Signed)
Patient presents today for Taxotere infusion per providers order.  Vital signs and labs within parameters.  No Taxotere today per Dr. Delton Coombes due to large weight loss.  Patient will receive 4 grams of Magnesium and Venofer today per providers order.  Magnesium and Venofer given today per MD orders.  Stable during infusion without adverse affects.  Vital signs stable.  No complaints at this time.  Discharge from clinic ambulatory in stable condition.  Alert and oriented X 3.  Follow up with [facility] as scheduled.

## 2021-12-02 NOTE — Progress Notes (Signed)
Prior authorization submitted for Dronabinol 5 mg to [employer] via CoverMyMeds.  PA was approved from 06/30/21-05/31/2022.

## 2021-12-02 NOTE — Patient Instructions (Signed)
[place] at [facility] Discharge Instructions   You were seen and examined today by Dr. Delton Coombes.  He reviewed the results of your lab work. Your magnesium is low. We will give you magnesium IV in the clinic today. Dr. Raliegh Ip also sent a prescription to your pharmacy.  We also sent prescriptions for a foam for your bottom where it is sore from diarrhea, and a prescription to help stimulate your appetite. This is a pill you take twice a day to help with your appetite. You have lost 20 lbs since the start of treatment. We will have Vinnie Level, our dietician, come speak with you today.   We will hold your treatment today. We will give you iron infusion today to help get your hemoglobin up.  Return in one week for lab work and possible treatment.    Thank you for choosing [place] at [facility] to provide your oncology and hematology care.  To afford each patient quality time with our provider, please arrive at least 15 minutes before your scheduled appointment time.   If you have a lab appointment with the [facility] please come in thru the Main Entrance and check in at the main information desk.  You need to re-schedule your appointment should you arrive 10 or more minutes late.  We strive to give you quality time with our providers, and arriving late affects you and other patients whose appointments are after yours.  Also, if you no show three or more times for appointments you may be dismissed from the clinic at the providers discretion.     Again, thank you for choosing [facility].  Our hope is that these requests will decrease the amount of time that you wait before being seen by our physicians.       _____________________________________________________________  Should you have questions after your visit to [facility], please contact our office at [phone] and follow the prompts.  Our office hours are 8:00  a.m. and 4:30 p.m. Monday - Friday.  Please note that voicemails left after 4:00 p.m. may not be returned until the following business day.  We are closed weekends and major holidays.  You do have access to a nurse 24-7, just call the main number to the clinic [phone] and do not press any options, hold on the line and a nurse will answer the phone.    For prescription refill requests, have your pharmacy contact our office and allow 72 hours.    Due to Covid, you will need to wear a mask upon entering the hospital. If you do not have a mask, a mask will be given to you at the Main Entrance upon arrival. For doctor visits, patients may have 1 support person age 63 or older with them. For treatment visits, patients can not have anyone with them due to social distancing guidelines and our immunocompromised population.

## 2021-12-03 ENCOUNTER — Encounter: Payer: Self-pay | Admitting: Family Medicine

## 2021-12-03 ENCOUNTER — Ambulatory Visit (INDEPENDENT_AMBULATORY_CARE_PROVIDER_SITE_OTHER): Payer: Medicare HMO | Admitting: Family Medicine

## 2021-12-03 VITALS — BP 126/68 | HR 80 | Temp 97.0°F | Ht 66.97 in | Wt 179.6 lb

## 2021-12-03 DIAGNOSIS — E756 Lipid storage disorder, unspecified: Secondary | ICD-10-CM

## 2021-12-03 DIAGNOSIS — E1169 Type 2 diabetes mellitus with other specified complication: Secondary | ICD-10-CM | POA: Diagnosis not present

## 2021-12-03 NOTE — Patient Instructions (Signed)
Bring all medications to your next appointment

## 2021-12-03 NOTE — Progress Notes (Signed)
Subjective:  Patient ID: Andrew [name], male    DOB: [birth year]  Age: 76 y.o. MRN: [id number]  CC: Follow-up and Diabetes   HPI Andrew [name] presents forFollow-up of diabetes. Patient checks blood sugar at home.   A little over 100 fasting and 180-190 postprandial. Taking Chemo still. Appetite varies.  Patient denies symptoms such as polyuria, polydipsia, excessive hunger, nausea No significant hypoglycemic spells noted. Medications reviewed. Pt reports taking them regularly without complication/adverse reaction being reported today.    HAs had 3 cycles of chemo. Needs three more,  Couldn't get treatment yesterday due to low albumin.   History Andrew [name] has a past medical history of AKI (acute kidney injury) ([place]) (10/04/2021), Anemia, BPH (benign prostatic hyperplasia), Cataract, Diabetes mellitus without complication ([place]), Educated about COVID-19 virus infection (07/18/2019), Genetic testing (05/09/2021), Hyperlipidemia, Hypertension, Low serum vitamin D, Port-A-Cath in place (10/09/2021), and Prostate cancer ([place]) (2008).   He has a past surgical history that includes Prostate surgery (2008); Colonoscopy ([phone]); and Portacath placement (Right, 10/17/2021).   His family history includes Colon polyps in his brother; Deep vein thrombosis in his sister; Diabetes in his mother; Emphysema in his father; Gout in his brother; Heart disease in his mother; Hypertension in his brother; Kidney disease in his mother and sister; Lung cancer in his sister.He reports that he has been smoking cigarettes. He started smoking about 56 years ago. He has a 55.00 pack-year smoking history. He has never used smokeless tobacco. He reports that he does not drink alcohol and does not use drugs.  Current Outpatient Medications on File Prior to Visit  Medication Sig Dispense Refill   alfuzosin (UROXATRAL) 10 MG 24 hr tablet Take 1 tablet (10 mg total) by mouth at bedtime. 90 tablet 3   ammonium lactate (AMLACTIN) 12 %  cream APPLY TWICE DAILY TO DRY SKIN ON FEET     aspirin 81 MG tablet Take 1 tablet (81 mg total) by mouth daily with breakfast. 30 tablet 3   atorvastatin (LIPITOR) 40 MG tablet Take 1 tablet (40 mg total) by mouth daily. (Patient taking differently: Take 40 mg by mouth at bedtime.) 90 tablet 3   azelastine (ASTELIN) 0.1 % nasal spray Place 1 spray into both nostrils 2 (two) times daily as needed for allergies.     BOOSTRIX 5-2.5-18.5 LF-MCG/0.5 injection      cholecalciferol (VITAMIN D) 1000 UNITS tablet Take 1,000 Units by mouth daily.      Dapagliflozin-sAXagliptin (QTERN) 10-5 MG TABS Take 1 tablet by mouth in the morning. 90 tablet 5   DOCEtaxel (TAXOTERE IV) Inject into the vein every 21 ( twenty-one) days.     docusate sodium (COLACE) 100 MG capsule Take 1 capsule (100 mg total) by mouth 2 (two) times daily. 60 capsule 2   dronabinol (MARINOL) 5 MG capsule Take 1 capsule (5 mg total) by mouth 2 (two) times daily before a meal. 60 capsule 3   escitalopram (LEXAPRO) 10 MG tablet Take 1 tablet (10 mg total) by mouth daily. 90 tablet 1   FARXIGA 10 MG TABS tablet Take 10 mg by mouth daily.     glimepiride (AMARYL) 2 MG tablet Take 2 mg by mouth 2 (two) times daily.     glucose blood test strip 1 each by Other route as needed for other. Use as instructed to test blood sugar daily.  One Touch Verio Test Strips DX: E11.65 100 each 11   lidocaine-prilocaine (EMLA) cream Apply a small amount to port  a cath site and cover with plastic wrap 1 hour prior to chemotherapy appointments 30 g 3   lisinopril-hydrochlorothiazide (ZESTORETIC) 20-25 MG tablet Take 1 tablet by mouth daily. 90 tablet 3   magnesium oxide (MAG-OX) 400 (240 Mg) MG tablet Take 1 tablet (400 mg total) by mouth 2 (two) times daily. 60 tablet 2   metFORMIN (GLUCOPHAGE) 1000 MG tablet Take 1 tablet (1,000 mg total) by mouth 2 (two) times daily with a meal. 180 tablet 2   oxyCODONE (ROXICODONE) 5 MG immediate release tablet Take 1 tablet  (5 mg total) by mouth every 8 (eight) hours as needed. 10 tablet 0   potassium chloride SA (KLOR-CON M) 20 MEQ tablet Take 1 tablet (20 mEq total) by mouth daily. 30 tablet 6   pramoxine (PROCTOFOAM) 1 % foam Place 1 application. rectally 3 (three) times daily as needed for anal itching. 15 g 0   prochlorperazine (COMPAZINE) 10 MG tablet Take 1 tablet (10 mg total) by mouth every 6 (six) hours as needed (Nausea or vomiting). 30 tablet 2   SHINGRIX injection      No current facility-administered medications on file prior to visit.    ROS Review of Systems  Constitutional:  Negative for fever.  Respiratory:  Negative for shortness of breath.   Cardiovascular:  Negative for chest pain.  Musculoskeletal:  Negative for arthralgias.  Skin:  Negative for rash.   Objective:  BP 126/68   Pulse 80   Temp (!) 97 F (36.1 C)   Ht 5' 6.97" (1.701 m)   Wt 179 lb 9.6 oz (81.5 kg)   SpO2 99%   BMI 28.15 kg/m   BP Readings from Last 3 Encounters:  12/03/21 126/68  12/02/21 137/61  12/02/21 103/64    Wt Readings from Last 3 Encounters:  12/03/21 179 lb 9.6 oz (81.5 kg)  12/02/21 178 lb 8 oz (81 kg)  11/11/21 185 lb 10 oz (84.2 kg)     Physical Exam Vitals reviewed.  Constitutional:      Appearance: He is well-developed.  HENT:     Head: Normocephalic and atraumatic.     Right Ear: External ear normal.     Left Ear: External ear normal.     Mouth/Throat:     Pharynx: No oropharyngeal exudate or posterior oropharyngeal erythema.  Eyes:     Pupils: Pupils are equal, round, and reactive to light.  Cardiovascular:     Rate and Rhythm: Normal rate and regular rhythm.     Heart sounds: No murmur heard. Pulmonary:     Effort: No respiratory distress.     Breath sounds: Normal breath sounds.  Musculoskeletal:     Cervical back: Normal range of motion and neck supple.  Neurological:     Mental Status: He is alert and oriented to person, place, and time.      Assessment & Plan:    [name] was seen today for follow-up and diabetes.  Diagnoses and all orders for this visit:  Diabetic lipidosis ([place])      I am having [name] maintain his cholecalciferol, glucose blood, alfuzosin, potassium chloride SA, azelastine, atorvastatin, lisinopril-hydrochlorothiazide, metFORMIN, DOCEtaxel (TAXOTERE IV), lidocaine-prilocaine, prochlorperazine, aspirin, oxyCODONE, docusate sodium, Boostrix, Shingrix, Qtern, escitalopram, Farxiga, glimepiride, ammonium lactate, dronabinol, pramoxine, and magnesium oxide.  Encouraged him to eat protein foods, and supplement with protein drinks such as glucerna   Follow-up: Return in about 6 weeks (around 01/14/2022) for diabetes.  [name], M.D.

## 2021-12-04 ENCOUNTER — Inpatient Hospital Stay (HOSPITAL_COMMUNITY): Payer: Medicare HMO

## 2021-12-05 ENCOUNTER — Telehealth: Payer: Self-pay | Admitting: Pharmacist

## 2021-12-05 ENCOUNTER — Ambulatory Visit (INDEPENDENT_AMBULATORY_CARE_PROVIDER_SITE_OTHER): Payer: Medicare HMO | Admitting: Pharmacist

## 2021-12-05 DIAGNOSIS — E1169 Type 2 diabetes mellitus with other specified complication: Secondary | ICD-10-CM

## 2021-12-05 DIAGNOSIS — E118 Type 2 diabetes mellitus with unspecified complications: Secondary | ICD-10-CM

## 2021-12-05 NOTE — Patient Instructions (Signed)
Visit Information  Following are the goals we discussed today:  Current Barriers:  Unable to independently afford treatment regimen Unable to achieve control of T2DM, HLD  Suboptimal therapeutic regimen for T2DM, HLD  Pharmacist Clinical Goal(s):  patient will verbalize ability to afford treatment regimen maintain control of T2DM, HLD as evidenced by GOAL A1C, IMPROVED GLYCEMIC CONTROL  through collaboration with PharmD and provider.   Interventions: 1:1 collaboration with [name], MD regarding development and update of comprehensive plan of care as evidenced by provider attestation and co-signature Inter-disciplinary care team collaboration (see longitudinal plan of care) Comprehensive medication review performed; medication list updated in electronic medical record  Diabetes: Goal on Track (progressing): YES. Uncontrolled-RECENT HOSPITALIZATION FOR HYPERGLYCEMIA, A1C NOW INCREASING -->7.9%, BLOOD SUGAR HAS IMPROVED OVER THE PAST 2-3 WEEKS CURRENTLY UNDERGOING CHEMOTHERAPY FOR PROSTATE CANCER Current treatment: METFORMIN, FARXIGA SAMPLES-->tradjenta/farxiga samples-->QTERN;  Will transition patient to QTERN (DPP4/SGLT2) combo given higher blood sugars on current regimen (qtern is available through patient assistance program) Patient approved for az&me patient assistance, but hasn't received shipment Plan to discontinue/decrease metformin GFR >60 -->37 Detailed instructions given to patient  New BG meter given--Contour Next ONE at last visit Education and set up provided Sent RX via parachute to [facility] D[facility] for patient to get free glucometer Current glucose readings: fasting glucose: UP TO 200s, MOSTLY <190, post prandial glucose: 200S Denies hypoglycemic/hyperglycemic symptoms Discussed meal planning options and Plate method for healthy eating Avoid sugary drinks and desserts Incorporate balanced protein, non starchy veggies, 1 serving of carbohydrate with each  meal Increase water intake Increase physical activity as able Current exercise: UNABLE DUE TO CHEMO Educated on new medication regimen Assessed patient finances. Enrolled in az&me patient assistance for QTERN  Hypertension -goal <130/80 -patient at goal--continue current regimen -true [place] metric documented  Patient Goals/Self-Care Activities patient will:  - take medications as prescribed as evidenced by patient report and record review check glucose DAILY OR IF TO, document, and provide at future appointments collaborate with provider on medication access solutions target a minimum of 150 minutes of moderate intensity exercise weekly engage in dietary modifications by FOLLOWING A HEART HEALTHY DIET/HEALTHY PLATE METHOD    Plan: Telephone follow up appointment with care management team member scheduled for:  3 weeks  Signature Regina Eck, PharmD, BCPS Clinical Pharmacist, [place]  [facility] Phone [phone]   Please call the care guide team at [phone] if you need to cancel or reschedule your appointment.   The patient verbalized understanding of instructions, educational materials, and care plan provided today and DECLINED offer to receive copy of patient instructions, educational materials, and care plan.

## 2021-12-05 NOTE — Progress Notes (Signed)
Chronic Care Management Pharmacy Note  12/05/2021 Name:  Andrew [name] MRN:  [id number] DOB:  [birth year]  Summary:  Diabetes: Goal on Track (progressing): YES. Uncontrolled-RECENT HOSPITALIZATION FOR HYPERGLYCEMIA, A1C NOW INCREASING -->7.9%, BLOOD SUGAR HAS IMPROVED OVER THE PAST 2-3 WEEKS CURRENTLY UNDERGOING CHEMOTHERAPY FOR PROSTATE CANCER Current treatment: METFORMIN, FARXIGA SAMPLES-->tradjenta/farxiga samples-->QTERN;  Will transition patient to QTERN (DPP4/SGLT2) combo given higher blood sugars on current regimen (qtern is available through patient assistance program) Patient approved for az&me patient assistance, but hasn't received shipment Plan to discontinue/decrease metformin GFR >60 -->37 Detailed instructions given to patient  New BG meter given--Contour Next ONE at last visit Education and set up provided Sent RX via parachute to [facility] D[facility] for patient to get free glucometer Current glucose readings: fasting glucose: UP TO 200s, MOSTLY <190, post prandial glucose: 200S Denies hypoglycemic/hyperglycemic symptoms Discussed meal planning options and Plate method for healthy eating Avoid sugary drinks and desserts Incorporate balanced protein, non starchy veggies, 1 serving of carbohydrate with each meal Increase water intake Increase physical activity as able Current exercise: UNABLE DUE TO CHEMO Educated on new medication regimen Assessed patient finances. Enrolled in az&me patient assistance for QTERN  Hypertension -goal <130/80 -patient at goal--continue current regimen -true [place] metric documented  Subjective: Andrew [name] is an 76 y.o. year old male who is a primary patient of Stacks, Cletus Gash, MD.  The CCM team was consulted for assistance with disease management and care coordination needs.    Engaged with patient by telephone for follow up visit in response to provider referral for pharmacy case management and/or care coordination services.    Consent to Services:  The patient was given information about Chronic Care Management services, agreed to services, and gave verbal consent prior to initiation of services.  Please see initial visit note for detailed documentation.   Patient Care Team: [name], MD as PCP - General (Family Medicine) Minus Breeding, MD as Consulting Physician (Cardiology) Alyson Ingles Candee Furbish, MD as Consulting Physician (Urology) Ilean China, RN as Case Manager Baljit Liebert, Royce Macadamia, [facility] (Pharmacist) Steffanie Rainwater, DPM as Consulting Physician (Podiatry) Hilarie Fredrickson, Lajuan Lines, MD as Consulting Physician (Gastroenterology) Harlen Labs, MD as Referring Physician (Optometry) Derek Jack, MD as Medical Oncologist (Medical Oncology)  Objective:  Lab Results  Component Value Date   CREATININE 1.35 (H) 12/02/2021   CREATININE 1.21 11/11/2021   CREATININE 1.47 (H) 10/30/2021    Lab Results  Component Value Date   HGBA1C 7.9 (H) 10/02/2021   Last diabetic Eye exam:  Lab Results  Component Value Date/Time   HMDIABEYEEXA No Retinopathy 08/28/2017 12:00 AM    Last diabetic Foot exam: No results found for: "HMDIABFOOTEX"      Component Value Date/Time   CHOL 129 10/02/2021 1500   CHOL 144 11/18/2012 0844   TRIG 251 (H) 10/02/2021 1500   TRIG 142 01/03/2016 0800   TRIG 161 (H) 11/18/2012 0844   HDL 32 (L) 10/02/2021 1500   HDL 31 (L) 01/03/2016 0800   HDL 27 (L) 11/18/2012 0844   CHOLHDL 4.0 10/02/2021 1500   LDLCALC 57 10/02/2021 1500   LDLCALC 45 03/08/2014 0810   [place]oval 85 11/18/2012 0844       Latest Ref Rng & Units 12/02/2021    9:11 AM 11/11/2021    8:20 AM 10/30/2021   12:09 PM  Hepatic Function  Total Protein 6.5 - 8.1 g/dL 7.1  6.6  7.0   Albumin 3.5 - 5.0 g/dL 2.7  2.3  3.3   AST 15 - 41 U/L 24  64  26   ALT 0 - 44 U/L 17  103  19   Alk Phosphatase 38 - 126 U/L 76  83  83   Total Bilirubin 0.3 - 1.2 mg/dL 0.6  0.6  0.8     Lab Results  Component Value Date/Time    TSH 1.540 04/10/2020 10:25 AM   TSH 1.950 12/29/2018 08:07 AM       Latest Ref Rng & Units 12/02/2021    9:11 AM 11/11/2021    8:20 AM 10/30/2021   12:09 PM  CBC  WBC 4.0 - 10.5 K/uL 7.9  6.5  15.8   Hemoglobin 13.0 - 17.0 g/dL 8.8  8.7  10.9   Hematocrit 39.0 - 52.0 % 26.3  26.6  33.0   Platelets 150 - 400 K/uL 312  559  219     Lab Results  Component Value Date/Time   VD25OH 33.9 07/26/2020 11:12 AM   VD25OH 31.1 04/10/2020 10:25 AM    Clinical ASCVD: No  The ASCVD Risk score (Arnett DK, et al., 2019) failed to calculate for the following reasons:   The valid total cholesterol range is 130 to 320 mg/dL    Other: (CHADS2VASc if Afib, PHQ9 if depression, MMRC or CAT for COPD, ACT, DEXA)  Social History   Tobacco Use  Smoking Status Every Day   Packs/day: 1.00   Years: 55.00   Total pack years: 55.00   Types: Cigarettes   Start date: 06/30/1965  Smokeless Tobacco Never  Tobacco Comments   Has tried nicotine replacement gum   BP Readings from Last 3 Encounters:  12/03/21 126/68  12/02/21 137/61  12/02/21 103/64   Pulse Readings from Last 3 Encounters:  12/03/21 80  12/02/21 65  12/02/21 80   Wt Readings from Last 3 Encounters:  12/03/21 179 lb 9.6 oz (81.5 kg)  12/02/21 178 lb 8 oz (81 kg)  11/11/21 185 lb 10 oz (84.2 kg)    Assessment: Review of patient past medical history, allergies, medications, health status, including review of consultants reports, laboratory and other test data, was performed as part of comprehensive evaluation and provision of chronic care management services.   SDOH:  (Social Determinants of Health) assessments and interventions performed:    CCM Care Plan  No Known Allergies  Medications Reviewed Today     Reviewed by Lavera Guise, [facility] (Pharmacist) on 12/05/21 at [place] List Status: <None>   Medication Order Taking? Sig Documenting Provider Last Dose Status Informant  alfuzosin (UROXATRAL) 10 MG 24 hr tablet [id number] No  Take 1 tablet (10 mg total) by mouth at bedtime. McKenzie, Candee Furbish, MD Taking Active Self, Spouse/Significant Other  ammonium lactate (AMLACTIN) 12 % cream [id number] No APPLY TWICE DAILY TO DRY SKIN ON FEET [provider] Taking Active   aspirin 81 MG tablet [id number] No Take 1 tablet (81 mg total) by mouth daily with breakfast. Pappayliou, Flint Melter, DO Taking Active   atorvastatin (LIPITOR) 40 MG tablet [id number] No Take 1 tablet (40 mg total) by mouth daily.  Patient taking differently: Take 40 mg by mouth at bedtime.   [name], MD Taking Active Self, Spouse/Significant Other  azelastine (ASTELIN) 0.1 % nasal spray [id number] No Place 1 spray into both nostrils 2 (two) times daily as needed for allergies. [provider] Taking Active Self, Spouse/Significant Other  BOOSTRIX 5-2.5-18.5 LF-MCG/0.5 injection [id number] No  [provider]  Taking Active   cholecalciferol (VITAMIN D) 1000 UNITS tablet [id number] No Take 1,000 Units by mouth daily.  [provider] Taking Active Self, Spouse/Significant Other  Dapagliflozin-sAXagliptin (QTERN) 10-5 MG TABS [id number] No Take 1 tablet by mouth in the morning. [name], MD Taking Active            Med Note Parthenia Ames Dec 05, 2021  9:25 AM) Via AZ&me patient assistance program   DOCEtaxel (TAXOTERE IV) [id number] No Inject into the vein every 21 ( twenty-one) days. [provider] Taking Active Self, Spouse/Significant Other  docusate sodium (COLACE) 100 MG capsule [id number] No Take 1 capsule (100 mg total) by mouth 2 (two) times daily. Pappayliou, Flint Melter, DO Taking Active   dronabinol (MARINOL) 5 MG capsule [id number] No Take 1 capsule (5 mg total) by mouth 2 (two) times daily before a meal. Derek Jack, MD Taking Active   escitalopram (LEXAPRO) 10 MG tablet [id number] No Take 1 tablet (10 mg total) by mouth daily. [name], MD Taking Active   FARXIGA 10 MG TABS tablet  [id number] No Take 10 mg by mouth daily. [provider] Taking Active   Discontinued 12/05/21 0926 (Change in therapy)   glucose blood test strip [id number] No 1 each by Other route as needed for other. Use as instructed to test blood sugar daily.  One Touch Verio Test Strips DX: E11.65 [name], MD Taking Active Self, Spouse/Significant Other           Med Note Blanca Friend, Royce Macadamia   Mon Oct 17, 2019  8:01 AM) One Touch Verio  lidocaine-prilocaine (EMLA) cream [id number] No Apply a small amount to port a cath site and cover with plastic wrap 1 hour prior to chemotherapy appointments Derek Jack, MD Taking Active Self, Spouse/Significant Other  lisinopril-hydrochlorothiazide (ZESTORETIC) 20-25 MG tablet [id number] No Take 1 tablet by mouth daily. [name], MD Taking Active Self, Spouse/Significant Other  magnesium oxide (MAG-OX) 400 (240 Mg) MG tablet [id number] No Take 1 tablet (400 mg total) by mouth 2 (two) times daily. Derek Jack, MD Taking Active   metFORMIN (GLUCOPHAGE) 1000 MG tablet [id number] No Take 1 tablet (1,000 mg total) by mouth 2 (two) times daily with a meal. Denton Brick, [place], MD Taking Active Self, Spouse/Significant Other  oxyCODONE (ROXICODONE) 5 MG immediate release tablet [id number] No Take 1 tablet (5 mg total) by mouth every 8 (eight) hours as needed. Pappayliou, Flint Melter, DO Taking Active   potassium chloride SA (KLOR-CON M) 20 MEQ tablet [id number] No Take 1 tablet (20 mEq total) by mouth daily. Derek Jack, MD Taking Active Self, Spouse/Significant Other  pramoxine (PROCTOFOAM) 1 % foam [id number] No Place 1 application. rectally 3 (three) times daily as needed for anal itching. Derek Jack, MD Taking Active   prochlorperazine (COMPAZINE) 10 MG tablet [id number] No Take 1 tablet (10 mg total) by mouth every 6 (six) hours as needed (Nausea or vomiting). Derek Jack, MD Taking Active Self, Spouse/Significant Other   [facility] injection [id number] No  [provider] Taking Active             Patient Active Problem List   Diagnosis Date Noted   Iron deficiency anemia 12/02/2021   Port-A-Cath in place 10/09/2021   Hyperglycemia due to diabetes mellitus ([place]) 10/04/2021   Aortic atherosclerosis ([place]) 01/18/2021   Primary insomnia 07/26/2020   Sacroiliitis ([place]) 07/26/2020   Nocturia 02/08/2020   Nonspecific abnormal electrocardiogram (ECG) (EKG) 07/18/2019   Type 2 diabetes  mellitus with complication, without long-term current use of insulin ([place]) 07/18/2019   SOB (shortness of breath) 12/10/2016   Erectile dysfunction due to arterial insufficiency 10/30/2016   Polyp of colon 10/30/2016   Peripheral vascular insufficiency ([place]) 06/13/2016   Vitamin D deficiency 08/24/2015   Prostate cancer ([place]) 04/07/2014   Type 2 diabetes mellitus without complications ([place]) 47/82/9562   Benign prostatic hyperplasia with urinary obstruction 09/14/2010   Essential hypertension 09/14/2010   Hyperlipidemia associated with type 2 diabetes mellitus ([place]) 09/14/2010   Tobacco abuse 09/14/2010    Immunization History  Administered Date(s) Administered   Fluad Quad(high Dose 65+) 04/06/2019, 04/10/2020   Influenza, High Dose Seasonal PF 05/13/2016, 04/14/2017, 04/06/2018   Influenza,inj,Quad PF,6+ Mos 04/18/2013, 04/07/2014, 04/06/2015   Moderna SARS-COV2 Booster Vaccination 06/06/2020, 11/21/2020   Moderna Sars-Covid-2 Vaccination 08/22/2019, 09/19/2019   Pneumococcal Conjugate-13 02/27/2014   Pneumococcal Polysaccharide-23 11/16/2012    Conditions to be addressed/monitored: HTN and DMII  Care Plan : PHARMD MEDICATION MANAGEMENT  Updates made by Lavera Guise, [place] since 12/05/2021 12:00 AM     Problem: DISEASE PROGRESSION PREVENTION      Long-Range Goal: T2DM PHARMD GOAL   Recent Progress: Not on track  Priority: High  Note:   Current Barriers:  Unable to independently afford treatment  regimen Unable to achieve control of T2DM, HLD  Suboptimal therapeutic regimen for T2DM, HLD  Pharmacist Clinical Goal(s):  patient will verbalize ability to afford treatment regimen maintain control of T2DM, HLD as evidenced by GOAL A1C, IMPROVED GLYCEMIC CONTROL  through collaboration with PharmD and provider.   Interventions: 1:1 collaboration with [name], MD regarding development and update of comprehensive plan of care as evidenced by provider attestation and co-signature Inter-disciplinary care team collaboration (see longitudinal plan of care) Comprehensive medication review performed; medication list updated in electronic medical record  Diabetes: Goal on Track (progressing): YES. Uncontrolled-RECENT HOSPITALIZATION FOR HYPERGLYCEMIA, A1C NOW INCREASING -->7.9%, BLOOD SUGAR HAS IMPROVED OVER THE PAST 2-3 WEEKS CURRENTLY UNDERGOING CHEMOTHERAPY FOR PROSTATE CANCER Current treatment: METFORMIN, FARXIGA SAMPLES-->tradjenta/farxiga samples-->QTERN;  Will transition patient to QTERN (DPP4/SGLT2) combo given higher blood sugars on current regimen (qtern is available through patient assistance program) Patient approved for az&me patient assistance, but hasn't received shipment Plan to discontinue/decrease metformin GFR >60 -->37 Detailed instructions given to patient  New BG meter given--Contour Next ONE at last visit Education and set up provided Sent RX via parachute to [facility] for patient to get free glucometer Current glucose readings: fasting glucose: UP TO 200s, MOSTLY <190, post prandial glucose: 200S Denies hypoglycemic/hyperglycemic symptoms Discussed meal planning options and Plate method for healthy eating Avoid sugary drinks and desserts Incorporate balanced protein, non starchy veggies, 1 serving of carbohydrate with each meal Increase water intake Increase physical activity as able Current exercise: UNABLE DUE TO CHEMO Educated on new medication  regimen Assessed patient finances. Enrolled in az&me patient assistance for QTERN  Hypertension -goal <130/80 -patient at goal--continue current regimen -true [place] metric documented  Patient Goals/Self-Care Activities patient will:  - take medications as prescribed as evidenced by patient report and record review check glucose DAILY OR IF TO, document, and provide at future appointments collaborate with provider on medication access solutions target a minimum of 150 minutes of moderate intensity exercise weekly engage in dietary modifications by   FOLLOWING A HEART HEALTHY DIET/HEALTHY PLATE METHOD       Medication Assistance:  qternobtained through az&me medication assistance program.  Enrollment ends 06/29/22  Patient's preferred pharmacy is:  [place], [place] [address] Phone: [phone] Fax: [phone]  [facility] - [place], [address] [place] [address] Phone: [phone] Fax: [phone]   Follow Up:  Patient agrees to Care Plan and Follow-up.  Plan: Telephone follow up appointment with care management team member scheduled for:  3 weeks   Regina Eck, PharmD, BCPS Clinical Pharmacist, [place]  II Phone [phone]

## 2021-12-05 NOTE — Telephone Encounter (Signed)
CAN YOU CHECK ON QTERN SHIPMENT AND LET PATIENT KNOW APPROVED ON 10/29/21, BUT PATIENT STILL HASN'T RECEIVED I GAVE SAMPLES SO HE SHOULD BE GOOD TO GO FOR A COUPLE OF WEEKS   THANK YOU!

## 2021-12-09 ENCOUNTER — Inpatient Hospital Stay (HOSPITAL_COMMUNITY): Payer: Medicare HMO

## 2021-12-09 ENCOUNTER — Inpatient Hospital Stay (HOSPITAL_BASED_OUTPATIENT_CLINIC_OR_DEPARTMENT_OTHER): Payer: Medicare HMO | Admitting: Hematology

## 2021-12-09 ENCOUNTER — Inpatient Hospital Stay (HOSPITAL_COMMUNITY): Payer: Medicare HMO | Admitting: Dietician

## 2021-12-09 VITALS — BP 142/62 | HR 66 | Temp 96.7°F | Resp 18

## 2021-12-09 DIAGNOSIS — Z79899 Other long term (current) drug therapy: Secondary | ICD-10-CM | POA: Diagnosis not present

## 2021-12-09 DIAGNOSIS — C775 Secondary and unspecified malignant neoplasm of intrapelvic lymph nodes: Secondary | ICD-10-CM | POA: Diagnosis not present

## 2021-12-09 DIAGNOSIS — C7951 Secondary malignant neoplasm of bone: Secondary | ICD-10-CM | POA: Diagnosis not present

## 2021-12-09 DIAGNOSIS — Z7984 Long term (current) use of oral hypoglycemic drugs: Secondary | ICD-10-CM | POA: Diagnosis not present

## 2021-12-09 DIAGNOSIS — R197 Diarrhea, unspecified: Secondary | ICD-10-CM | POA: Diagnosis not present

## 2021-12-09 DIAGNOSIS — D509 Iron deficiency anemia, unspecified: Secondary | ICD-10-CM

## 2021-12-09 DIAGNOSIS — C61 Malignant neoplasm of prostate: Secondary | ICD-10-CM | POA: Diagnosis not present

## 2021-12-09 DIAGNOSIS — Z7982 Long term (current) use of aspirin: Secondary | ICD-10-CM | POA: Diagnosis not present

## 2021-12-09 DIAGNOSIS — D649 Anemia, unspecified: Secondary | ICD-10-CM | POA: Diagnosis not present

## 2021-12-09 DIAGNOSIS — R69 Illness, unspecified: Secondary | ICD-10-CM | POA: Diagnosis not present

## 2021-12-09 LAB — CBC WITH DIFFERENTIAL/PLATELET
Abs Immature Granulocytes: 0.04 10*3/uL (ref 0.00–0.07)
Basophils Absolute: 0.1 10*3/uL (ref 0.0–0.1)
Basophils Relative: 1 %
Eosinophils Absolute: 0.5 10*3/uL (ref 0.0–0.5)
Eosinophils Relative: 6 %
HCT: 27.9 % — ABNORMAL LOW (ref 39.0–52.0)
Hemoglobin: 9.1 g/dL — ABNORMAL LOW (ref 13.0–17.0)
Immature Granulocytes: 1 %
Lymphocytes Relative: 28 %
Lymphs Abs: 2.4 10*3/uL (ref 0.7–4.0)
MCH: 31.1 pg (ref 26.0–34.0)
MCHC: 32.6 g/dL (ref 30.0–36.0)
MCV: 95.2 fL (ref 80.0–100.0)
Monocytes Absolute: 0.6 10*3/uL (ref 0.1–1.0)
Monocytes Relative: 7 %
Neutro Abs: 5 10*3/uL (ref 1.7–7.7)
Neutrophils Relative %: 57 %
Platelets: 360 10*3/uL (ref 150–400)
RBC: 2.93 MIL/uL — ABNORMAL LOW (ref 4.22–5.81)
RDW: 15.2 % (ref 11.5–15.5)
WBC: 8.6 10*3/uL (ref 4.0–10.5)
nRBC: 0 % (ref 0.0–0.2)

## 2021-12-09 LAB — COMPREHENSIVE METABOLIC PANEL
ALT: 14 U/L (ref 0–44)
AST: 20 U/L (ref 15–41)
Albumin: 2.8 g/dL — ABNORMAL LOW (ref 3.5–5.0)
Alkaline Phosphatase: 75 U/L (ref 38–126)
Anion gap: 7 (ref 5–15)
BUN: 23 mg/dL (ref 8–23)
CO2: 24 mmol/L (ref 22–32)
Calcium: 8.6 mg/dL — ABNORMAL LOW (ref 8.9–10.3)
Chloride: 107 mmol/L (ref 98–111)
Creatinine, Ser: 1.28 mg/dL — ABNORMAL HIGH (ref 0.61–1.24)
GFR, Estimated: 58 mL/min — ABNORMAL LOW (ref >60)
Glucose, Bld: 96 mg/dL (ref 70–99)
Potassium: 3.6 mmol/L (ref 3.5–5.1)
Sodium: 138 mmol/L (ref 135–145)
Total Bilirubin: 0.8 mg/dL (ref 0.3–1.2)
Total Protein: 7.1 g/dL (ref 6.5–8.1)

## 2021-12-09 LAB — MAGNESIUM: Magnesium: 1.4 mg/dL — ABNORMAL LOW (ref 1.7–2.4)

## 2021-12-09 LAB — PSA: Prostatic Specific Antigen: 7.23 ng/mL — ABNORMAL HIGH (ref 0.00–4.00)

## 2021-12-09 MED ORDER — HEPARIN SOD (PORK) LOCK FLUSH 100 UNIT/ML IV SOLN
500.0000 [IU] | Freq: Once | INTRAVENOUS | Status: AC
  Administered 2021-12-09: 500 [IU] via INTRAVENOUS

## 2021-12-09 MED ORDER — SODIUM CHLORIDE 0.9% FLUSH
10.0000 mL | Freq: Once | INTRAVENOUS | Status: AC
  Administered 2021-12-09: 10 mL via INTRAVENOUS

## 2021-12-09 MED ORDER — MAGNESIUM SULFATE 2 GM/50ML IV SOLN
2.0000 g | Freq: Once | INTRAVENOUS | Status: AC
  Administered 2021-12-09: 2 g via INTRAVENOUS
  Filled 2021-12-09: qty 50

## 2021-12-09 MED ORDER — SODIUM CHLORIDE 0.9 % IV SOLN: Freq: Once | INTRAVENOUS | Status: AC

## 2021-12-09 MED ORDER — LORATADINE 10 MG PO TABS
10.0000 mg | ORAL_TABLET | Freq: Once | ORAL | Status: AC
  Administered 2021-12-09: 10 mg via ORAL
  Filled 2021-12-09: qty 1

## 2021-12-09 MED ORDER — ACETAMINOPHEN 325 MG PO TABS
650.0000 mg | ORAL_TABLET | Freq: Once | ORAL | Status: AC
  Administered 2021-12-09: 650 mg via ORAL
  Filled 2021-12-09: qty 2

## 2021-12-09 MED ORDER — SODIUM CHLORIDE 0.9 % IV SOLN
300.0000 mg | Freq: Once | INTRAVENOUS | Status: AC
  Administered 2021-12-09: 300 mg via INTRAVENOUS
  Filled 2021-12-09: qty 300

## 2021-12-09 NOTE — Patient Instructions (Signed)
[facility]  Discharge Instructions: Thank you for choosing [facility] to provide your oncology and hematology care.  If you have a lab appointment with the [facility], please come in thru the Main Entrance and check in at the main information desk.  Wear comfortable clothing and clothing appropriate for easy access to any Portacath or PICC line.   We strive to give you quality time with your provider. You may need to reschedule your appointment if you arrive late (15 or more minutes).  Arriving late affects you and other patients whose appointments are after yours.  Also, if you miss three or more appointments without notifying the office, you may be dismissed from the clinic at the provider's discretion.      For prescription refill requests, have your pharmacy contact our office and allow 72 hours for refills to be completed.    Today you received the following chemotherapy and/or immunotherapy agents Venofer 300 mg  and 2 grams Magnesium Sulfate. Magnesium Sulfate Injection What is this medication? MAGNESIUM SULFATE (mag NEE zee um SUL fate) prevents and treats low levels of magnesium in your body. It may also be used to prevent and treat seizures during pregnancy in people with high blood pressure disorders, such as preeclampsia or eclampsia. Magnesium plays an important role in maintaining the health of your muscles and nervous system. This medicine may be used for other purposes; ask your health care provider or pharmacist if you have questions. What should I tell my care team before I take this medication? They need to know if you have any of these conditions: Heart disease History of irregular heart beat Kidney disease An unusual or allergic reaction to magnesium sulfate, medications, foods, dyes, or preservatives Pregnant or trying to get pregnant Breast-feeding How should I use this medication? This medication is for infusion into a vein. It is given  in a hospital or clinic setting. Talk to your care team about the use of this medication in children. While this medication may be prescribed for selected conditions, precautions do apply. Overdosage: If you think you have taken too much of this medicine contact a poison control center or emergency room at once. NOTE: This medicine is only for you. Do not share this medicine with others. What if I miss a dose? This does not apply. What may interact with this medication? Certain medications for anxiety or sleep Certain medications for seizures like phenobarbital Digoxin Medications that relax muscles for surgery Narcotic medications for pain This list may not describe all possible interactions. Give your health care provider a list of all the medicines, herbs, non-prescription drugs, or dietary supplements you use. Also tell them if you smoke, drink alcohol, or use illegal drugs. Some items may interact with your medicine. What should I watch for while using this medication? Your condition will be monitored carefully while you are receiving this medication. You may need blood work done while you are receiving this medication. What side effects may I notice from receiving this medication? Side effects that you should report to your care team as soon as possible: Allergic reactions--skin rash, itching, hives, swelling of the face, lips, tongue, or throat High magnesium level--confusion, drowsiness, facial flushing, redness, sweating, muscle weakness, fast or irregular heartbeat, trouble breathing Low blood pressure--dizziness, feeling faint or lightheaded, blurry vision Side effects that usually do not require medical attention (report to your care team if they continue or are bothersome): Headache Nausea This list may not describe  all possible side effects. Call your doctor for medical advice about side effects. You may report side effects to FDA at 1-800-FDA-1088. Where should I keep my  medication? This medication is given in a hospital or clinic and will not be stored at home. NOTE: This sheet is a summary. It may not cover all possible information. If you have questions about this medicine, talk to your doctor, pharmacist, or health care provider.  2023 Elsevier/Gold Standard (2020-08-30 00:00:00)       To help prevent nausea and vomiting after your treatment, we encourage you to take your nausea medication as directed.  BELOW ARE SYMPTOMS THAT SHOULD BE REPORTED IMMEDIATELY: *FEVER GREATER THAN 100.4 F (38 C) OR HIGHER *CHILLS OR SWEATING *NAUSEA AND VOMITING THAT IS NOT CONTROLLED WITH YOUR NAUSEA MEDICATION *UNUSUAL SHORTNESS OF BREATH *UNUSUAL BRUISING OR BLEEDING *URINARY PROBLEMS (pain or burning when urinating, or frequent urination) *BOWEL PROBLEMS (unusual diarrhea, constipation, pain near the anus) TENDERNESS IN MOUTH AND THROAT WITH OR WITHOUT PRESENCE OF ULCERS (sore throat, sores in mouth, or a toothache) UNUSUAL RASH, SWELLING OR PAIN  UNUSUAL VAGINAL DISCHARGE OR ITCHING   Items with * indicate a potential emergency and should be followed up as soon as possible or go to the Emergency Department if any problems should occur.  Please show the CHEMOTHERAPY ALERT CARD or IMMUNOTHERAPY ALERT CARD at check-in to the Emergency Department and triage nurse.  Should you have questions after your visit or need to cancel or reschedule your appointment, please contact [facility] [phone]  and follow the prompts.  Office hours are 8:00 a.m. to 4:30 p.m. Monday - Friday. Please note that voicemails left after 4:00 p.m. may not be returned until the following business day.  We are closed weekends and major holidays. You have access to a nurse at all times for urgent questions. Please call the main number to the clinic [phone] and follow the prompts.  For any non-urgent questions, you may also contact your provider using MyChart. We now offer  e-Visits for anyone 9 and older to request care online for non-urgent symptoms. For details visit [email or web address].   Also download the MyChart app! Go to the app store, search "MyChart", open the app, select [facility], and log in with your MyChart username and password.  Due to Covid, a mask is required upon entering the hospital/clinic. If you do not have a mask, one will be given to you upon arrival. For doctor visits, patients may have 1 support person aged 50 or older with them. For treatment visits, patients cannot have anyone with them due to current Covid guidelines and our immunocompromised population.

## 2021-12-09 NOTE — Progress Notes (Signed)
Nutrition Follow-up:  Patient with prostate cancer metastatic to intrapelvic lymph node. He is currently receiving docetaxel + prednisone q21d (started 4/10)  Met with patient in infusion. He reports ongoing poor appetite. Patient awaiting insurance approval for marinol. He will resume megace for appetite at this time per MD. Patient is drinking 1-2 Ensure. He is tolerating well but says it is "so rich." Yesterday he recalls 1 Ensure, sliced bread, green beans, stewed potatoes, ~2 oz pork chop, and pack of nabs. Patient denies nausea, vomiting, constipation. He reports loose bowel movement last night.    Medications: marinol, mag-ox (6/5)  Labs: Mg 1.4, Cr 1.28 -pt to receive magnesium sulfate IVPB 2g/50 ml   Anthropometrics: Weight 176 lb 8 oz today decreased 2 lbs in 7 days; significant   NUTRITION DIAGNOSIS: Severe malnutrition ongoing  INTERVENTION:  Continued education on eating small frequent snacks (q2h) with adequate calories and protein - reviewed ideas, pt has handout  Suggested cutting supplement with milk to decrease rich taste, recommend pt increase to 3 Ensure Plus/day Reviewed ways to add calories and protein to foods (pt to add Ensure to coffee instead of cream/sugar)  Patient to resume Megace pending Marinol ins approval per MD Encouraged baking soda salt water rinses several times daily before meals    MONITORING, EVALUATION, GOAL: weight trends, intake    NEXT VISIT: To be scheduled with treatment

## 2021-12-09 NOTE — Patient Instructions (Addendum)
[place] at [facility] Discharge Instructions   You were seen and examined today by Dr. Delton Coombes.  He reviewed the results of your lab work which is normal/stable, except your magnesium is low. We will give you IV magnesium today. Please pick up your magnesium prescription and start taking as prescribed.   You have lost 2 lbs since your visit last week. Take Marinol twice daily as prescribed to help boost your appetite. While you are waiting for insurance to cover the Marinol, try taking Megace again (2 teaspoons twice a day). Continue to supplement your diet with Boost or Ensure 2 cans daily.   We will hold treatment today. We will give you iron infusion.  We will see you back in 2 weeks.    Thank you for choosing [place] at [facility] to provide your oncology and hematology care.  To afford each patient quality time with our provider, please arrive at least 15 minutes before your scheduled appointment time.   If you have a lab appointment with the [facility] please come in thru the Main Entrance and check in at the main information desk.  You need to re-schedule your appointment should you arrive 10 or more minutes late.  We strive to give you quality time with our providers, and arriving late affects you and other patients whose appointments are after yours.  Also, if you no show three or more times for appointments you may be dismissed from the clinic at the providers discretion.     Again, thank you for choosing [facility].  Our hope is that these requests will decrease the amount of time that you wait before being seen by our physicians.       _____________________________________________________________  Should you have questions after your visit to [facility], please contact our office at [phone] and follow the prompts.  Our office hours are 8:00 a.m. and 4:30 p.m. Monday - Friday.  Please note  that voicemails left after 4:00 p.m. may not be returned until the following business day.  We are closed weekends and major holidays.  You do have access to a nurse 24-7, just call the main number to the clinic [phone] and do not press any options, hold on the line and a nurse will answer the phone.    For prescription refill requests, have your pharmacy contact our office and allow 72 hours.    Due to Covid, you will need to wear a mask upon entering the hospital. If you do not have a mask, a mask will be given to you at the Main Entrance upon arrival. For doctor visits, patients may have 1 support person age 40 or older with them. For treatment visits, patients can not have anyone with them due to social distancing guidelines and our immunocompromised population.

## 2021-12-09 NOTE — Telephone Encounter (Signed)
Left pt voicemail informing him to reach out to [facility] [phone] for product replacement if never rec'd on 11/08/21, & if he has further questions he can reach me at [phone]

## 2021-12-09 NOTE — Progress Notes (Signed)
Patient presents today for treatment and follow up visit with Dr. Ludwig Lean. Vital signs within parameters for treatment. MAR reviewed and updated. Labs pending. Patient has no complaints of any changes since his last treatment.   Message received from A. Beckie Salts / Dr. Delton Coombes. Hold tx today. Patient has had another 2 lb weight loss. Orders received to infuse Venofer 300 mg IV and 2 grams Magnesium Sulfate.   Venofer 300 mg and 2 grams Magnesium Sulfate given today per MD orders. Tolerated infusion without adverse affects. Vital signs stable. No complaints at this time. Discharged from clinic ambulatory in stable condition. Alert and oriented x 3. F/U with [facility] as scheduled.

## 2021-12-09 NOTE — Progress Notes (Signed)
[name], [name] [id number]   CLINIC:  Medical Oncology/Hematology  PCP:  Andrew Fraise, MD 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 Report date is 05/07/2021.  The Multi-Cancer + RNA Panel offered by [facility] includes sequencing and/or deletion/duplication analysis of the following 84 genes:  AIP*, ALK, APC*, ATM*, AXIN2*, BAP1*, BARD1*, BLM*, BMPR1A*, BRCA1*, BRCA2*, BRIP1*, CASR, CDC73*, CDH1*, CDK4, CDKN1B*, CDKN1C*, CDKN2A, CEBPA, CHEK2*, CTNNA1*, DICER1*, DIS3L2*, EGFR, EPCAM, FH*, FLCN*, GATA2*, GPC3, GREM1, HOXB13, HRAS, KIT, MAX*, MEN1*, MET, MITF, MLH1*, MSH2*, MSH3*, MSH6*, MUTYH*, NBN*, NF1*, NF2*, NTHL1*, PALB2*, PDGFRA, PHOX2B, PMS2*, POLD1*, POLE*, POT1*, PRKAR1A*, PTCH1*, PTEN*, RAD50*, RAD51C*, RAD51D*, RB1*, RECQL4, RET, RUNX1*, SDHA*, SDHAF2*, SDHB*, SDHC*, SDHD*, SMAD4*, SMARCA4*, SMARCB1*, SMARCE1*, STK11*, SUFU*, TERC, TERT, TMEM127*, Tp53*, TSC1*, TSC2*, VHL*, WRN*, and WT1.  RNA analysis is performed for * genes.   10/21/2021 -  Chemotherapy   Patient is on Treatment Plan : PROSTATE Docetaxel + Prednisone q21d       CANCER STAGING:  Cancer Staging  Prostate cancer [name]) Staging form: Prostate, [name] 7th Edition - Clinical stage from 04/02/2021: rT2b, N1, PSA: 10 to 19, Gleason 7 - Unsigned   INTERVAL  HISTORY:  [name], a 76 y.o. male, returns for routine follow-up and consideration for next cycle of chemotherapy. Andrew [name] was last seen on 12/02/2021.  Due for cycle #3 of Taxotere today.   Overall, he tells me he has been feeling pretty well. He has lost 2 lbs since his last visit. He has not started Marinol as the prescription has not been filled yet. He is drinking 1 bottle of Ensure daily; on some days he will drink 2 bottles, but he mostly drinks one daily with small portions of solid food. His appetite is poor. He denies n/v/d. He has not started magnesium.   Overall, he feels ready for next cycle of chemo today.    REVIEW OF SYSTEMS:  Review of Systems  Constitutional:  Positive for appetite change and unexpected weight change (-2 lbs). Negative for fatigue.  Gastrointestinal:  Negative for diarrhea, nausea and vomiting.  All other systems reviewed and are negative.   PAST MEDICAL/SURGICAL HISTORY:  Past Medical History:  Diagnosis Date   AKI (acute kidney injury) (Andrew [name]) 10/04/2021   Anemia    BPH (benign prostatic hyperplasia)    Sees Dr [name]   Cataract    Diabetes mellitus without complication [facility])    Educated about COVID-19 virus infection 07/18/2019   Genetic testing 05/09/2021   [facility] Multi-Cancer Panel was Negative. Report date is 05/07/2021.  The Multi-Cancer + RNA Panel offered by [facility] includes sequencing and/or deletion/duplication analysis of the following 84 genes:  AIP*, ALK, APC*, ATM*, AXIN2*, BAP1*, BARD1*, BLM*, BMPR1A*, BRCA1*, BRCA2*, BRIP1*, CASR, CDC73*, CDH1*, CDK4, CDKN1B*, CDKN1C*, CDKN2A, CEBPA, CHEK2*, CTNNA1*, DICER1*, DIS3L2*, EGFR, EPCAM, FH*, F   Hyperlipidemia    Hypertension  Low serum vitamin D    Port-A-Cath in place 10/09/2021   Prostate cancer [name]) 2008   w/seed implantation and radiation   Past Surgical History:  Procedure Laterality Date   COLONOSCOPY  2015,2018   PORTACATH PLACEMENT Right 10/17/2021   Procedure: INSERTION  PORT-A-CATH, Andrew [name];  Surgeon: Andrew Chamber, Andrew [name];  Location: [name];  Service: General;  Laterality: Right;  pt needs to have glucose checked AM of surgery   PROSTATE SURGERY  2008   seed implant    SOCIAL HISTORY:  Social History   Socioeconomic History   Marital status: Married    Spouse name: Andrew [name]   Number of children: 2   Years of education: 12   Highest education level: 12th grade  Occupational History   Occupation: [name]    Comment: Retired Public librarian  Tobacco Use   Smoking status: Every Day    Packs/day: 1.00    Years: 55.00    Total pack years: 55.00    Types: Cigarettes    Start date: 06/30/1965   Smokeless tobacco: Never   Tobacco comments:    Has tried nicotine replacement gum  Vaping Use   Vaping Use: Never used  Substance and Sexual Activity   Alcohol use: No   Drug use: No   Sexual activity: Yes  Other Topics Concern   Not on file  Social History Narrative   Retired, lives at home with wife Andrew [name], two grown children, enjoys gardening     Social Determinants of Health   Financial Resource Strain: Low Risk  (01/24/2021)   Overall Financial Resource Strain (CARDIA)    Difficulty of Paying Living Expenses: Not hard at all  Food Insecurity: No Food Insecurity (01/24/2021)   Hunger Vital Sign    Worried About Running Out of Food in the Last Year: Never true    Ran Out of Food in the Last Year: Never true  Transportation Needs: No Transportation Needs (01/24/2021)   PRAPARE - Administrator, Civil Service (Medical): No    Lack of Transportation (Non-Medical): No  Physical Activity: Inactive (01/24/2021)   Exercise Vital Sign    Days of Exercise per Week: 0 days    Minutes of Exercise per Session: 0 min  Stress: No Stress Concern Present (01/24/2021)   [employer] of Occupational Health - Occupational Stress Questionnaire    Feeling of Stress : Not at all  Social Connections: Socially Integrated (01/24/2021)   Social  Connection and Isolation Panel [NHANES]    Frequency of Communication with Friends and Family: More than three times a week    Frequency of Social Gatherings with Friends and Family: More than three times a week    Attends Religious Services: More than 4 times per year    Active Member of [employer] or Organizations: Yes    Attends Engineer, structural: More than 4 times per year    Marital Status: Married  Catering manager Violence: Not At Risk (01/24/2021)   Humiliation, Afraid, Rape, and Kick questionnaire    Fear of Current or Ex-Partner: No    Emotionally Abused: No    Physically Abused: No    Sexually Abused: No    FAMILY HISTORY:  Family History  Problem Relation Age of Onset   Diabetes Mother    Heart disease Mother    Kidney disease Mother        DIALYSIS   Emphysema Father    Deep vein thrombosis Sister  Kidney disease Sister    Lung cancer Sister    Colon polyps Brother    Hypertension Brother    Gout Brother    Colon cancer Neg Hx    Esophageal cancer Neg Hx    Rectal cancer Neg Hx    Prostate cancer Neg Hx     CURRENT MEDICATIONS:  Current Outpatient Medications  Medication Sig Dispense Refill   alfuzosin (UROXATRAL) 10 MG 24 hr tablet Take 1 tablet (10 mg total) by mouth at bedtime. 90 tablet 3   ammonium lactate (AMLACTIN) 12 % cream APPLY TWICE DAILY TO DRY SKIN ON FEET     aspirin 81 MG tablet Take 1 tablet (81 mg total) by mouth daily with breakfast. 30 tablet 3   atorvastatin (LIPITOR) 40 MG tablet Take 1 tablet (40 mg total) by mouth daily. (Patient taking differently: Take 40 mg by mouth at bedtime.) 90 tablet 3   azelastine (ASTELIN) 0.1 % nasal spray Place 1 spray into both nostrils 2 (two) times daily as needed for allergies.     BOOSTRIX 5-2.5-18.5 LF-MCG/0.5 injection      cholecalciferol (VITAMIN D) 1000 UNITS tablet Take 1,000 Units by mouth daily.      Dapagliflozin-sAXagliptin (QTERN) 10-5 MG TABS Take 1 tablet by mouth in the morning.  90 tablet 5   DOCEtaxel (TAXOTERE IV) Inject into the vein every 21 ( twenty-one) days.     docusate sodium (COLACE) 100 MG capsule Take 1 capsule (100 mg total) by mouth 2 (two) times daily. 60 capsule 2   dronabinol (MARINOL) 5 MG capsule Take 1 capsule (5 mg total) by mouth 2 (two) times daily before a meal. 60 capsule 3   escitalopram (LEXAPRO) 10 MG tablet Take 1 tablet (10 mg total) by mouth daily. 90 tablet 1   FARXIGA 10 MG TABS tablet Take 10 mg by mouth daily.     glucose blood test strip 1 each by Other route as needed for other. Use as instructed to test blood sugar daily.  One Touch Verio Test Strips DX: E11.65 100 each 11   lidocaine-prilocaine (EMLA) cream Apply a small amount to port a cath site and cover with plastic wrap 1 hour prior to chemotherapy appointments 30 g 3   lisinopril-hydrochlorothiazide (ZESTORETIC) 20-25 MG tablet Take 1 tablet by mouth daily. 90 tablet 3   magnesium oxide (MAG-OX) 400 (240 Mg) MG tablet Take 1 tablet (400 mg total) by mouth 2 (two) times daily. 60 tablet 2   metFORMIN (GLUCOPHAGE) 1000 MG tablet Take 1 tablet (1,000 mg total) by mouth 2 (two) times daily with a meal. 180 tablet 2   oxyCODONE (ROXICODONE) 5 MG immediate release tablet Take 1 tablet (5 mg total) by mouth every 8 (eight) hours as needed. 10 tablet 0   potassium chloride SA (KLOR-CON M) 20 MEQ tablet Take 1 tablet (20 mEq total) by mouth daily. 30 tablet 6   pramoxine (PROCTOFOAM) 1 % foam Place 1 application. rectally 3 (three) times daily as needed for anal itching. 15 g 0   prochlorperazine (COMPAZINE) 10 MG tablet Take 1 tablet (10 mg total) by mouth every 6 (six) hours as needed (Nausea or vomiting). 30 tablet 2   SHINGRIX injection      No current facility-administered medications for this visit.    ALLERGIES:  No Known Allergies  PHYSICAL EXAM:  Performance status (ECOG): 1 - Symptomatic but completely ambulatory  There were no vitals filed for this visit. Wt Readings  from  Last 3 Encounters:  12/03/21 179 lb 9.6 oz (81.5 kg)  12/02/21 178 lb 8 oz (81 kg)  11/11/21 185 lb 10 oz (84.2 kg)   Physical Exam Vitals reviewed.  Constitutional:      Appearance: Normal appearance.  Cardiovascular:     Rate and Rhythm: Normal rate and regular rhythm.     Pulses: Normal pulses.     Heart sounds: Normal heart sounds.  Pulmonary:     Effort: Pulmonary effort is normal.     Breath sounds: Normal breath sounds.  Neurological:     General: No focal deficit present.     Mental Status: He is alert and oriented to person, place, and time.  Psychiatric:        Mood and Affect: Mood normal.        Behavior: Behavior normal.     LABORATORY DATA:  I have reviewed the labs as listed.     Latest Ref Rng & Units 12/02/2021    9:11 AM 11/11/2021    8:20 AM 10/30/2021   12:09 PM  CBC  WBC 4.0 - 10.5 K/uL 7.9  6.5  15.8   Hemoglobin 13.0 - 17.0 g/dL 8.8  8.7  10.9   Hematocrit 39.0 - 52.0 % 26.3  26.6  33.0   Platelets 150 - 400 K/uL 312  559  219       Latest Ref Rng & Units 12/02/2021    9:11 AM 11/11/2021    8:20 AM 10/30/2021   12:09 PM  CMP  Glucose 70 - 99 mg/dL 99  128  171   BUN 8 - 23 mg/dL _0 Creatinine 0.61 - 1.24 mg/dL 1.35  1.21  1.47   Sodium 135 - 145 mmol/L 135  134  138   Potassium 3.5 - 5.1 mmol/L 3.3  3.6  3.5   Chloride 98 - 111 mmol/L 107  100  101   CO2 22 - 32 mmol/L _1 Calcium 8.9 - 10.3 mg/dL 8.1  8.2  8.9   Total Protein 6.5 - 8.1 g/dL 7.1  6.6  7.0   Total Bilirubin 0.3 - 1.2 mg/dL 0.6  0.6  0.8   Alkaline Phos 38 - 126 U/L 76  83  83   AST 15 - 41 U/L 24  64  26   ALT 0 - 44 U/L 17  103  19     DIAGNOSTIC IMAGING:  I have independently reviewed the scans and discussed with the patient. No results found.   ASSESSMENT:  Metastatic castration refractory prostate cancer to left iliac lymph node: - History of prostate adenocarcinoma, T2b, Gleason 7, PSA 16.6 - Treated with neoadjuvant hormonal therapy, EBRT  followed by brachytherapy with iodine 125 seeds in 2010 - Rising PSA levels in February 2019 - Bone scan on 08/10/2017 negative.  CTAP on 08/26/2017 shows enlarged left pelvic sidewall lymph node 2.2 cm. - Lupron initiated on 08/10/2019. - CTAP on 03/09/2020 with left internal/external iliac lymph node measuring 2.1 x 2.8 cm (2.1 x 2.2 cm), craniocaudal 4.1 cm, previously 3.4 cm. - Enzalutamide to 160 mg daily started on 04/17/2020.  Discontinued end of October 2022. - PSA on 02/20/2021 (5.2), 07/11/2020 (2.0), 02/08/2020 (3.1) - CTAP on 03/07/2021 with left iliac bifurcation lymph node measuring 3.5 x 2.6 cm (previously 2.8 x 2.0 cm) - Bone scan on 03/07/2021 negative. - Left pelvic lymph node biopsy on 04/08/2021, metastatic prostate cancer. -  We have reviewed PSMA PET scan results which showed enlarged left iliac lymph node 33 mm with SUV 285.  1 mm lymph node and 4 mm node between IVC and aorta.  Activity in the right acetabulum, left iliac bone, left transverse process of T11 vertebral body, spine of the right scapula. - Guardant 360 did not show any targetable mutations.  Did not show MSI high. - NGS testing could not be done because of less than 100 tumor cells. - Abiraterone and prednisone from 07/15/2021 through 10/01/2021 with progression. - Germline mutation [facility] testing was negative. - Cycle 1 of docetaxel on 10/21/2021.   PLAN:  Metastatic castration refractory prostate cancer to the left iliac lymph node: - I have held his cycle 3 last week due to weight loss. - He lost another 2 pounds since last week. - I have reviewed his labs which showed creatinine 1.28.  CBC was grossly normal.  Other LFTs were normal except albumin 2.8. - I will hold his treatment today.  I will reevaluate him in 2 weeks with repeat PSA.  2.  Weight loss: - He lost 9 pounds after cycle 2 and 13 pounds after cycle 1 amounting to 22 pounds. - He was evaluated by our dietitian. - He is drinking 1 can of Ensure plus  most days of the week and occasionally 2 cans/day.  He reports decrease in appetite but no other GI symptoms. - He could not get Marinol filled yet.  I have told him to restart Megace and see if it helps with his appetite.  He had diarrhea the first time he took it but was not clear if it was the real culprit.  3.  Normocytic anemia: - He received Venofer first dose on 12/02/2021, uneventful. - Proceed with second dose of Venofer today.  4.  Bone metastasis: - We will start denosumab after dental evaluation.  5.  Hypomagnesemia: - He has not started taking magnesium which was prescribed at last visit.  Magnesium today is 1.4.  Received 2 g of IV magnesium today.  He was told to start magnesium twice daily.   Orders placed this encounter:  No orders of the defined types were placed in this encounter.    Derek Jack, MD [place] [phone]   I, Thana Ates, am acting as a scribe for Dr. Derek Jack.  I, Derek Jack MD, have reviewed the above documentation for accuracy and completeness, and I agree with the above.

## 2021-12-11 ENCOUNTER — Ambulatory Visit (HOSPITAL_COMMUNITY): Payer: Medicare HMO

## 2021-12-19 ENCOUNTER — Telehealth: Payer: Medicare HMO

## 2021-12-21 DIAGNOSIS — E1165 Type 2 diabetes mellitus with hyperglycemia: Secondary | ICD-10-CM | POA: Diagnosis not present

## 2021-12-23 ENCOUNTER — Inpatient Hospital Stay (HOSPITAL_COMMUNITY): Payer: Medicare HMO

## 2021-12-23 ENCOUNTER — Other Ambulatory Visit (HOSPITAL_COMMUNITY): Payer: Self-pay | Admitting: *Deleted

## 2021-12-23 ENCOUNTER — Other Ambulatory Visit: Payer: Self-pay

## 2021-12-23 ENCOUNTER — Inpatient Hospital Stay (HOSPITAL_BASED_OUTPATIENT_CLINIC_OR_DEPARTMENT_OTHER): Payer: Medicare HMO | Admitting: Hematology

## 2021-12-23 VITALS — BP 113/70 | HR 81 | Temp 97.2°F | Resp 18 | Ht 66.5 in | Wt 174.0 lb

## 2021-12-23 VITALS — BP 130/70 | HR 70 | Temp 98.4°F | Resp 18

## 2021-12-23 DIAGNOSIS — Z7982 Long term (current) use of aspirin: Secondary | ICD-10-CM | POA: Diagnosis not present

## 2021-12-23 DIAGNOSIS — R69 Illness, unspecified: Secondary | ICD-10-CM | POA: Diagnosis not present

## 2021-12-23 DIAGNOSIS — D509 Iron deficiency anemia, unspecified: Secondary | ICD-10-CM

## 2021-12-23 DIAGNOSIS — D649 Anemia, unspecified: Secondary | ICD-10-CM | POA: Diagnosis not present

## 2021-12-23 DIAGNOSIS — C61 Malignant neoplasm of prostate: Secondary | ICD-10-CM

## 2021-12-23 DIAGNOSIS — C775 Secondary and unspecified malignant neoplasm of intrapelvic lymph nodes: Secondary | ICD-10-CM | POA: Diagnosis not present

## 2021-12-23 DIAGNOSIS — Z7984 Long term (current) use of oral hypoglycemic drugs: Secondary | ICD-10-CM | POA: Diagnosis not present

## 2021-12-23 DIAGNOSIS — Z79899 Other long term (current) drug therapy: Secondary | ICD-10-CM | POA: Diagnosis not present

## 2021-12-23 DIAGNOSIS — R197 Diarrhea, unspecified: Secondary | ICD-10-CM | POA: Diagnosis not present

## 2021-12-23 DIAGNOSIS — C7951 Secondary malignant neoplasm of bone: Secondary | ICD-10-CM | POA: Diagnosis not present

## 2021-12-23 LAB — CBC WITH DIFFERENTIAL/PLATELET
Abs Immature Granulocytes: 0.02 10*3/uL (ref 0.00–0.07)
Basophils Absolute: 0.1 10*3/uL (ref 0.0–0.1)
Basophils Relative: 1 %
Eosinophils Absolute: 0.3 10*3/uL (ref 0.0–0.5)
Eosinophils Relative: 4 %
HCT: 30.6 % — ABNORMAL LOW (ref 39.0–52.0)
Hemoglobin: 10 g/dL — ABNORMAL LOW (ref 13.0–17.0)
Immature Granulocytes: 0 %
Lymphocytes Relative: 26 %
Lymphs Abs: 2.1 10*3/uL (ref 0.7–4.0)
MCH: 31.5 pg (ref 26.0–34.0)
MCHC: 32.7 g/dL (ref 30.0–36.0)
MCV: 96.5 fL (ref 80.0–100.0)
Monocytes Absolute: 0.6 10*3/uL (ref 0.1–1.0)
Monocytes Relative: 7 %
Neutro Abs: 4.9 10*3/uL (ref 1.7–7.7)
Neutrophils Relative %: 62 %
Platelets: 257 10*3/uL (ref 150–400)
RBC: 3.17 MIL/uL — ABNORMAL LOW (ref 4.22–5.81)
RDW: 17.2 % — ABNORMAL HIGH (ref 11.5–15.5)
WBC: 8 10*3/uL (ref 4.0–10.5)
nRBC: 0 % (ref 0.0–0.2)

## 2021-12-23 LAB — COMPREHENSIVE METABOLIC PANEL
ALT: 22 U/L (ref 0–44)
AST: 23 U/L (ref 15–41)
Albumin: 3.1 g/dL — ABNORMAL LOW (ref 3.5–5.0)
Alkaline Phosphatase: 64 U/L (ref 38–126)
Anion gap: 9 (ref 5–15)
BUN: 25 mg/dL — ABNORMAL HIGH (ref 8–23)
CO2: 19 mmol/L — ABNORMAL LOW (ref 22–32)
Calcium: 8.7 mg/dL — ABNORMAL LOW (ref 8.9–10.3)
Chloride: 107 mmol/L (ref 98–111)
Creatinine, Ser: 1.29 mg/dL — ABNORMAL HIGH (ref 0.61–1.24)
GFR, Estimated: 57 mL/min — ABNORMAL LOW (ref >60)
Glucose, Bld: 106 mg/dL — ABNORMAL HIGH (ref 70–99)
Potassium: 4.1 mmol/L (ref 3.5–5.1)
Sodium: 135 mmol/L (ref 135–145)
Total Bilirubin: 0.8 mg/dL (ref 0.3–1.2)
Total Protein: 7 g/dL (ref 6.5–8.1)

## 2021-12-23 LAB — MAGNESIUM: Magnesium: 1.7 mg/dL (ref 1.7–2.4)

## 2021-12-23 MED ORDER — SODIUM CHLORIDE 0.9% FLUSH
10.0000 mL | INTRAVENOUS | Status: DC | PRN
  Administered 2021-12-23: 10 mL

## 2021-12-23 MED ORDER — SODIUM CHLORIDE 0.9 % IV SOLN
400.0000 mg | Freq: Once | INTRAVENOUS | Status: AC
  Administered 2021-12-23: 400 mg via INTRAVENOUS
  Filled 2021-12-23: qty 20

## 2021-12-23 MED ORDER — MEGESTROL ACETATE 400 MG/10ML PO SUSP: 400.0000 mg | Freq: Two times a day (BID) | ORAL | 4 refills | Status: DC

## 2021-12-23 MED ORDER — HEPARIN SOD (PORK) LOCK FLUSH 100 UNIT/ML IV SOLN
500.0000 [IU] | Freq: Once | INTRAVENOUS | Status: AC
  Administered 2021-12-23: 500 [IU] via INTRAVENOUS

## 2021-12-23 MED ORDER — LORATADINE 10 MG PO TABS
10.0000 mg | ORAL_TABLET | Freq: Once | ORAL | Status: AC
  Administered 2021-12-23: 10 mg via ORAL
  Filled 2021-12-23: qty 1

## 2021-12-23 MED ORDER — ACETAMINOPHEN 325 MG PO TABS
650.0000 mg | ORAL_TABLET | Freq: Once | ORAL | Status: AC
  Administered 2021-12-23: 650 mg via ORAL
  Filled 2021-12-23: qty 2

## 2021-12-23 MED ORDER — SODIUM CHLORIDE 0.9 % IV SOLN: Freq: Once | INTRAVENOUS | Status: AC

## 2021-12-23 NOTE — Progress Notes (Signed)
[facility] 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 Report date is 05/07/2021.  The Multi-Cancer + RNA Panel offered by [facility] includes sequencing and/or deletion/duplication analysis of the following 84 genes:  AIP*, ALK, APC*, ATM*, AXIN2*, BAP1*, BARD1*, BLM*, BMPR1A*, BRCA1*, BRCA2*, BRIP1*, CASR, CDC73*, CDH1*, CDK4, CDKN1B*, CDKN1C*, CDKN2A, CEBPA, CHEK2*, CTNNA1*, DICER1*, DIS3L2*, EGFR, EPCAM, FH*, FLCN*, GATA2*, GPC3, GREM1, HOXB13, HRAS, KIT, MAX*, MEN1*, MET, MITF, MLH1*, MSH2*, MSH3*, MSH6*, MUTYH*, NBN*, NF1*, NF2*, NTHL1*, PALB2*, PDGFRA, PHOX2B, PMS2*, POLD1*, POLE*, POT1*, PRKAR1A*, PTCH1*, PTEN*, RAD50*, RAD51C*, RAD51D*, RB1*, RECQL4, RET, RUNX1*, SDHA*, SDHAF2*, SDHB*, SDHC*, SDHD*, SMAD4*, SMARCA4*, SMARCB1*, SMARCE1*, STK11*, SUFU*, TERC, TERT, TMEM127*, Tp53*, TSC1*, TSC2*, VHL*, WRN*, and WT1.  RNA analysis is performed for * genes.   10/21/2021 -  Chemotherapy   Patient is on Treatment Plan : PROSTATE Docetaxel + Prednisone q21d       CANCER STAGING:  Cancer Staging  Prostate cancer [facility]) Staging form: Prostate, [name] 7th Edition - Clinical stage from 04/02/2021: rT2b, N1, PSA: 10 to 19, Gleason 7 - Unsigned   INTERVAL  HISTORY:  Andrew [name], [name], returns for routine follow-up and consideration for next cycle of chemotherapy. Andrew [name] was last seen on 12/09/2021.  Due for cycle #3 of Taxotere today.   Overall, he tells me he has been feeling pretty well. He has lost 2 lbs since his last visit on 6/12. He started Megace, and he reports his appetite had improved; he reports his eating has improved.  He is eating 2 small meals with 2 bottle of Ensure daily. He denies tingling/numbness, recent falls, and new pains.   Overall, he is not ready for next cycle of chemo today.   REVIEW OF SYSTEMS:  Review of Systems  Constitutional:  Positive for unexpected weight change (-2 lbs). Negative for appetite change and fatigue.  Neurological:  Negative for numbness.  All other systems reviewed and are negative.   PAST MEDICAL/SURGICAL HISTORY:  Past Medical History:  Diagnosis Date   AKI (acute kidney injury) (HCC) 10/04/2021   Anemia    BPH (benign prostatic hyperplasia)    Sees Dr Jerre Simon   Cataract    Diabetes mellitus without complication [facility])    Educated about COVID-19 virus infection 07/18/2019   Genetic testing 05/09/2021   [facility] Multi-Cancer Panel was Negative. Report date is 05/07/2021.  The Multi-Cancer + RNA Panel offered by [facility] includes sequencing and/or deletion/duplication analysis of the following 84 genes:  AIP*, ALK, APC*, ATM*, AXIN2*, BAP1*, BARD1*, BLM*, BMPR1A*, BRCA1*, BRCA2*, BRIP1*, CASR, CDC73*, CDH1*, CDK4, CDKN1B*, CDKN1C*, CDKN2A, CEBPA, CHEK2*, CTNNA1*, DICER1*, DIS3L2*, EGFR, EPCAM, FH*, F   Hyperlipidemia    Hypertension    Low serum vitamin D    Port-A-Cath in place 10/09/2021   Prostate cancer (  HCC) 2008   w/seed implantation and radiation   Past Surgical History:  Procedure Laterality Date   COLONOSCOPY  2015,2018   PORTACATH PLACEMENT Right 10/17/2021   Procedure: INSERTION PORT-A-CATH, Coralyn Pear;  Surgeon: Lewie Chamber, DO;  Location: [name];  Service:  General;  Laterality: Right;  pt needs to have glucose checked AM of surgery   PROSTATE SURGERY  2008   seed implant    SOCIAL HISTORY:  Social History   Socioeconomic History   Marital status: Married    Spouse name: Andrew [name]   Number of children: 2   Years of education: 12   Highest education level: 12th grade  Occupational History   Occupation: [name]    Comment: Retired Public librarian  Tobacco Use   Smoking status: Every Day    Packs/day: 1.00    Years: 55.00    Total pack years: 55.00    Types: Cigarettes    Start date: 06/30/1965   Smokeless tobacco: Never   Tobacco comments:    Has tried nicotine replacement gum  Vaping Use   Vaping Use: Never used  Substance and Sexual Activity   Alcohol use: No   Drug use: No   Sexual activity: Yes  Other Topics Concern   Not on file  Social History Narrative   Retired, lives at home with wife Andrew [name], two grown children, enjoys gardening     Social Determinants of Health   Financial Resource Strain: Low Risk  (01/24/2021)   Overall Financial Resource Strain (CARDIA)    Difficulty of Paying Living Expenses: Not hard at all  Food Insecurity: No Food Insecurity (01/24/2021)   Hunger Vital Sign    Worried About Running Out of Food in the Last Year: Never true    Ran Out of Food in the Last Year: Never true  Transportation Needs: No Transportation Needs (01/24/2021)   PRAPARE - Administrator, Civil Service (Medical): No    Lack of Transportation (Non-Medical): No  Physical Activity: Inactive (01/24/2021)   Exercise Vital Sign    Days of Exercise per Week: 0 days    Minutes of Exercise per Session: 0 min  Stress: No Stress Concern Present (01/24/2021)   [employer] of Occupational Health - Occupational Stress Questionnaire    Feeling of Stress : Not at all  Social Connections: Socially Integrated (01/24/2021)   Social Connection and Isolation Panel [NHANES]    Frequency of Communication with Friends and  Family: More than three times a week    Frequency of Social Gatherings with Friends and Family: More than three times a week    Attends Religious Services: More than 4 times per year    Active Member of [employer] or Organizations: Yes    Attends Engineer, structural: More than 4 times per year    Marital Status: Married  Catering manager Violence: Not At Risk (01/24/2021)   Humiliation, Afraid, Rape, and Kick questionnaire    Fear of Current or Ex-Partner: No    Emotionally Abused: No    Physically Abused: No    Sexually Abused: No    FAMILY HISTORY:  Family History  Problem Relation Age of Onset   Diabetes Mother    Heart disease Mother    Kidney disease Mother        DIALYSIS   Emphysema Father    Deep vein thrombosis Sister    Kidney disease Sister    Lung cancer Sister    Colon polyps  Brother    Hypertension Brother    Gout Brother    Colon cancer Neg Hx    Esophageal cancer Neg Hx    Rectal cancer Neg Hx    Prostate cancer Neg Hx     CURRENT MEDICATIONS:  Current Outpatient Medications  Medication Sig Dispense Refill   alfuzosin (UROXATRAL) 10 MG 24 hr tablet Take 1 tablet (10 mg total) by mouth at bedtime. 90 tablet 3   ammonium lactate (AMLACTIN) 12 % cream APPLY TWICE DAILY TO DRY SKIN ON FEET     aspirin 81 MG tablet Take 1 tablet (81 mg total) by mouth daily with breakfast. 30 tablet 3   atorvastatin (LIPITOR) 40 MG tablet Take 1 tablet (40 mg total) by mouth daily. (Patient taking differently: Take 40 mg by mouth at bedtime.) 90 tablet 3   azelastine (ASTELIN) 0.1 % nasal spray Place 1 spray into both nostrils 2 (two) times daily as needed for allergies.     BOOSTRIX 5-2.5-18.5 LF-MCG/0.5 injection      cholecalciferol (VITAMIN D) 1000 UNITS tablet Take 1,000 Units by mouth daily.      Dapagliflozin-sAXagliptin (QTERN) 10-5 MG TABS Take 1 tablet by mouth in the morning. 90 tablet 5   DOCEtaxel (TAXOTERE IV) Inject into the vein every 21 ( twenty-one) days.      docusate sodium (COLACE) 100 MG capsule Take 1 capsule (100 mg total) by mouth 2 (two) times daily. 60 capsule 2   dronabinol (MARINOL) 5 MG capsule Take 1 capsule (5 mg total) by mouth 2 (two) times daily before a meal. 60 capsule 3   escitalopram (LEXAPRO) 10 MG tablet Take 1 tablet (10 mg total) by mouth daily. 90 tablet 1   FARXIGA 10 MG TABS tablet Take 10 mg by mouth daily.     glucose blood test strip 1 each by Other route as needed for other. Use as instructed to test blood sugar daily.  One Touch Verio Test Strips DX: E11.65 100 each 11   lidocaine-prilocaine (EMLA) cream Apply a small amount to port a cath site and cover with plastic wrap 1 hour prior to chemotherapy appointments 30 g 3   lisinopril-hydrochlorothiazide (ZESTORETIC) 20-25 MG tablet Take 1 tablet by mouth daily. 90 tablet 3   magnesium oxide (MAG-OX) 400 (240 Mg) MG tablet Take 1 tablet (400 mg total) by mouth 2 (two) times daily. 60 tablet 2   metFORMIN (GLUCOPHAGE) 1000 MG tablet Take 1 tablet (1,000 mg total) by mouth 2 (two) times daily with a meal. 180 tablet 2   oxyCODONE (ROXICODONE) 5 MG immediate release tablet Take 1 tablet (5 mg total) by mouth every 8 (eight) hours as needed. 10 tablet 0   potassium chloride SA (KLOR-CON M) 20 MEQ tablet Take 1 tablet (20 mEq total) by mouth daily. 30 tablet 6   pramoxine (PROCTOFOAM) 1 % foam Place 1 application. rectally 3 (three) times daily as needed for anal itching. 15 g 0   prochlorperazine (COMPAZINE) 10 MG tablet Take 1 tablet (10 mg total) by mouth every 6 (six) hours as needed (Nausea or vomiting). 30 tablet 2   No current facility-administered medications for this visit.    ALLERGIES:  No Known Allergies  PHYSICAL EXAM:  Performance status (ECOG): 1 - Symptomatic but completely ambulatory  Vitals:   12/23/21 0831  BP: 113/70  Pulse: 81  Resp: 18  Temp: (!) 97.2 F (36.2 C)  SpO2: 100%   Wt Readings from Last 3 Encounters:  12/23/21 174 lb (78.9 kg)   12/09/21 176 lb 8 oz (80.1 kg)  12/03/21 179 lb 9.6 oz (81.5 kg)   Physical Exam Vitals reviewed.  Constitutional:      Appearance: Normal appearance.  Cardiovascular:     Rate and Rhythm: Normal rate and regular rhythm.     Pulses: Normal pulses.     Heart sounds: Normal heart sounds.  Pulmonary:     Effort: Pulmonary effort is normal.     Breath sounds: Normal breath sounds.  Neurological:     General: No focal deficit present.     Mental Status: He is alert and oriented to person, place, and time.  Psychiatric:        Mood and Affect: Mood normal.        Behavior: Behavior normal.     LABORATORY DATA:  I have reviewed the labs as listed.     Latest Ref Rng & Units 12/23/2021    8:30 AM 12/09/2021    8:13 AM 12/02/2021    9:11 AM  CBC  WBC 4.0 - 10.5 K/uL 8.0  8.6  7.9   Hemoglobin 13.0 - 17.0 g/dL 16.1  9.1  8.8   Hematocrit 39.0 - 52.0 % 30.6  27.9  26.3   Platelets 150 - 400 K/uL 257  360  312       Latest Ref Rng & Units 12/23/2021    8:30 AM 12/09/2021    8:13 AM 12/02/2021    9:11 AM  CMP  Glucose 70 - 99 mg/dL 096  96  99   BUN 8 - 23 mg/dL 25  23  22    Creatinine 0.61 - 1.24 mg/dL 0.45  4.09  8.11   Sodium 135 - 145 mmol/L 135  138  135   Potassium 3.5 - 5.1 mmol/L 4.1  3.6  3.3   Chloride 98 - 111 mmol/L 107  107  107   CO2 22 - 32 mmol/L 19  24  20    Calcium 8.9 - 10.3 mg/dL 8.7  8.6  8.1   Total Protein 6.5 - 8.1 g/dL 7.0  7.1  7.1   Total Bilirubin 0.3 - 1.2 mg/dL 0.8  0.8  0.6   Alkaline Phos 38 - 126 U/L 64  75  76   AST 15 - 41 U/L 23  20  24    ALT 0 - 44 U/L 22  14  17      DIAGNOSTIC IMAGING:  I have independently reviewed the scans and discussed with the patient. No results found.   ASSESSMENT:  Metastatic castration refractory prostate cancer to left iliac lymph node: - History of prostate adenocarcinoma, T2b, Gleason 7, PSA 16.6 - Treated with neoadjuvant hormonal therapy, EBRT followed by brachytherapy with iodine 125 seeds in 2010 -  Rising PSA levels in February 2019 - Bone scan on 08/10/2017 negative.  CTAP on 08/26/2017 shows enlarged left pelvic sidewall lymph node 2.2 cm. - Lupron initiated on 08/10/2019. - CTAP on 03/09/2020 with left internal/external iliac lymph node measuring 2.1 x 2.8 cm (2.1 x 2.2 cm), craniocaudal 4.1 cm, previously 3.4 cm. - Enzalutamide to 160 mg daily started on 04/17/2020.  Discontinued end of October 2022. - PSA on 02/20/2021 (5.2), 07/11/2020 (2.0), 02/08/2020 (3.1) - CTAP on 03/07/2021 with left iliac bifurcation lymph node measuring 3.5 x 2.6 cm (previously 2.8 x 2.0 cm) - Bone scan on 03/07/2021 negative. - Left pelvic lymph node biopsy on 04/08/2021, metastatic prostate cancer. - We have  reviewed PSMA PET scan results which showed enlarged left iliac lymph node 33 mm with SUV 285.  1 mm lymph node and 4 mm node between IVC and aorta.  Activity in the right acetabulum, left iliac bone, left transverse process of T11 vertebral body, spine of the right scapula. - Guardant 360 did not show any targetable mutations.  Did not show MSI high. - NGS testing could not be done because of less than 100 tumor cells. - Abiraterone and prednisone from 07/15/2021 through 10/01/2021 with progression. - Germline mutation [facility] testing was negative. - Cycle 1 of docetaxel on 10/21/2021.   PLAN:  Metastatic castration refractory prostate cancer to the left iliac lymph node: - We have held his cycle 3 due to weight loss.  Weight is not improving.  Last PSA was 7.23 on 12/09/2021.  It was 5.87 on 10/21/2021. - I have reviewed his labs which shows normal LFTs.  CKD stable with creatinine 1.29.  I will hold his cycle 3 today. - I plan to do a CTAP with contrast to follow-up on the lymph node as his PSA has gone up.  RTC after scan.  2.  Weight loss: - He lost total of 9 pounds after cycle 2 and 13 pounds after cycle 1.  He lost another 4 pounds in the last 2 weeks. - He is taking Megace which she thinks it is helping to  eat better.  He is also drinking 2 cans of Ensure per day and eating 2 small portion meals per day. - Continue Megace.  3.  Normocytic anemia: - Hemoglobin today improved to 10.  Proceed with last dose of Venofer today.  4.  Bone metastasis: - We will start him on denosumab after dental evaluation.  5.  Hypomagnesemia: - Continue magnesium twice daily.  Magnesium today is normal at 1.7.   Orders placed this encounter:  No orders of the defined types were placed in this encounter.    Doreatha Massed, MD [facility] [phone]   I, Alda Ponder, am acting as a scribe for Dr. Doreatha Massed.  I, Doreatha Massed MD, have reviewed the above documentation for accuracy and completeness, and I agree with the above.

## 2021-12-25 ENCOUNTER — Ambulatory Visit (HOSPITAL_COMMUNITY): Payer: Medicare HMO

## 2021-12-25 ENCOUNTER — Telehealth: Payer: Medicare HMO

## 2021-12-27 DIAGNOSIS — E1169 Type 2 diabetes mellitus with other specified complication: Secondary | ICD-10-CM

## 2021-12-27 DIAGNOSIS — E785 Hyperlipidemia, unspecified: Secondary | ICD-10-CM | POA: Diagnosis not present

## 2021-12-27 DIAGNOSIS — Z7984 Long term (current) use of oral hypoglycemic drugs: Secondary | ICD-10-CM | POA: Diagnosis not present

## 2022-01-02 ENCOUNTER — Encounter (HOSPITAL_BASED_OUTPATIENT_CLINIC_OR_DEPARTMENT_OTHER): Payer: Self-pay

## 2022-01-02 ENCOUNTER — Ambulatory Visit (HOSPITAL_BASED_OUTPATIENT_CLINIC_OR_DEPARTMENT_OTHER)
Admission: RE | Admit: 2022-01-02 | Discharge: 2022-01-02 | Disposition: A | Discharge status: Home / Self Care | Payer: Medicare HMO | Source: Ambulatory Visit | Attending: Hematology | Admitting: Hematology

## 2022-01-02 DIAGNOSIS — C61 Malignant neoplasm of prostate: Secondary | ICD-10-CM | POA: Diagnosis not present

## 2022-01-02 DIAGNOSIS — C775 Secondary and unspecified malignant neoplasm of intrapelvic lymph nodes: Secondary | ICD-10-CM | POA: Diagnosis not present

## 2022-01-02 DIAGNOSIS — N281 Cyst of kidney, acquired: Secondary | ICD-10-CM | POA: Diagnosis not present

## 2022-01-02 DIAGNOSIS — K5730 Diverticulosis of large intestine without perforation or abscess without bleeding: Secondary | ICD-10-CM | POA: Diagnosis not present

## 2022-01-02 MED ORDER — IOHEXOL 300 MG/ML  SOLN
100.0000 mL | Freq: Once | INTRAMUSCULAR | Status: AC | PRN
  Administered 2022-01-02: 80 mL via INTRAVENOUS

## 2022-01-02 MED ORDER — HEPARIN SOD (PORK) LOCK FLUSH 100 UNIT/ML IV SOLN
500.0000 [IU] | Freq: Once | INTRAVENOUS | Status: AC
  Administered 2022-01-02: 500 [IU] via INTRAVENOUS

## 2022-01-03 IMAGING — CT NM PET TUM IMG SKULL BASE T - THIGH
7 series · 25 of 25 positions shown · non-contrast
Comparison: CT and bone scan 03/07/2021

CLINICAL DATA: Prostate carcinoma with biochemical recurrence.
Metastatic surveillance

EXAM:
NUCLEAR MEDICINE PET SKULL BASE TO THIGH
TECHNIQUE: 8.9 mCi F18 Piflufolastat (Pylarify) was injected intravenously.
Full-ring PET imaging was performed from the skull base to thigh
after the radiotracer. CT data was obtained and used for attenuation
correction and anatomic localization.

[Series 3: pet sk_thigh ac · axial · 5.0mm · 4.07mm/px · z∈[-1289,-313]mm · 5 of 245 slices shown]
[im 1/245]
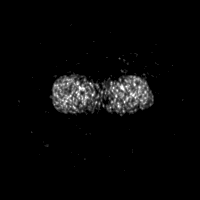
[im 62/245]
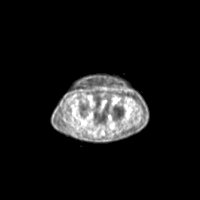
[im 123/245]
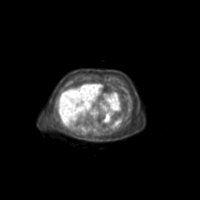
[im 184/245]
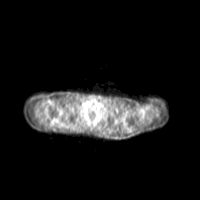
[im 245/245]
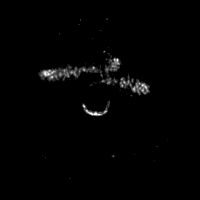

[Series 4: ct sk_thigh 5.0 bf37 · axial · 5.0mm · 0.98mm/px · z∈[-1289,-313]mm · 6 of 245 slices shown]
[im 1/245]
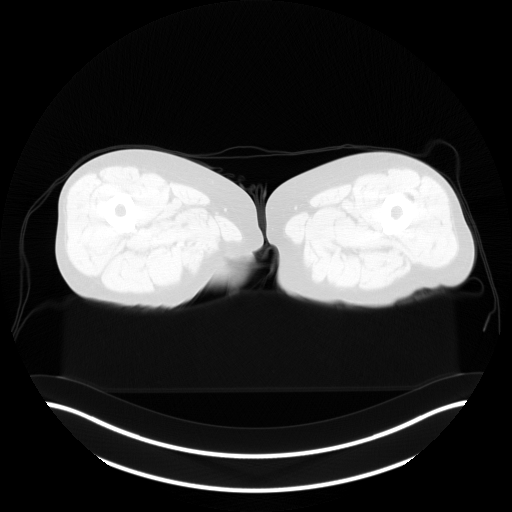
[im 49/245]
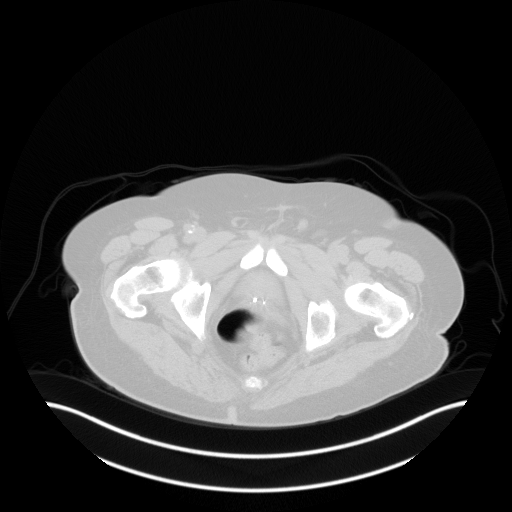
[im 98/245]
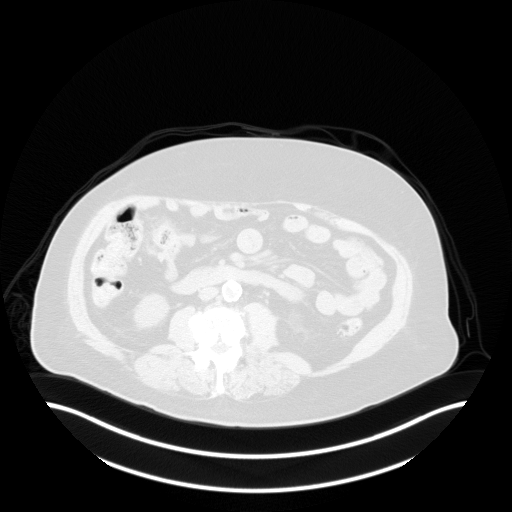
[im 147/245]
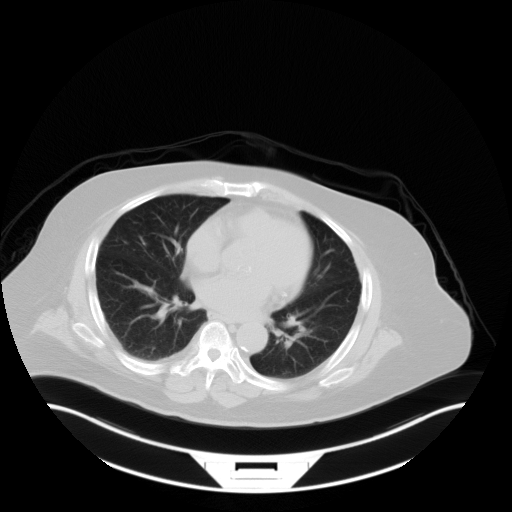
[im 196/245]
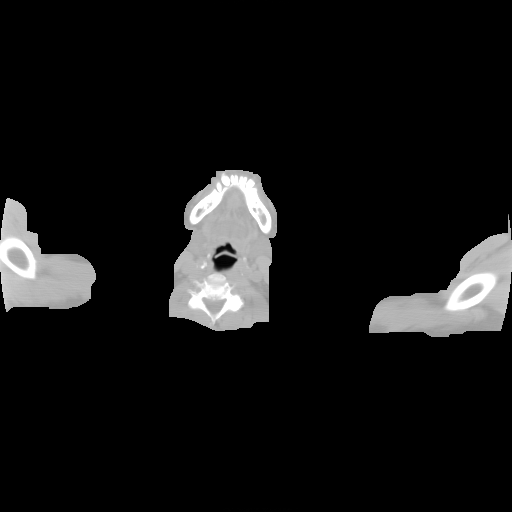
[im 245/245]
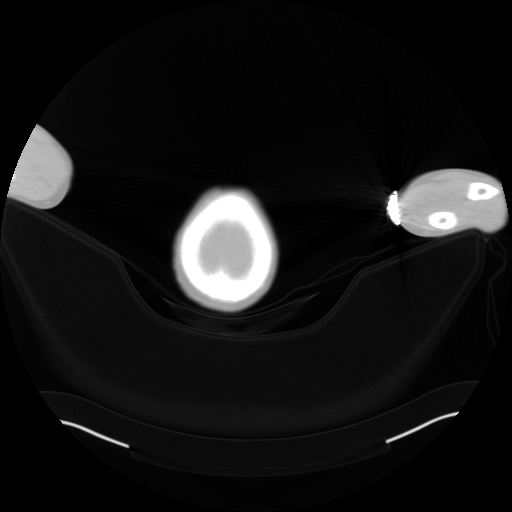

[Series 5: pet sk_thigh nac · axial · 5.0mm · 4.07mm/px · z∈[-1289,-313]mm · 6 of 245 slices shown]
[im 1/245]
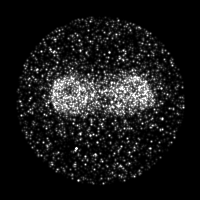
[im 49/245]
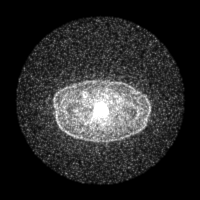
[im 98/245]
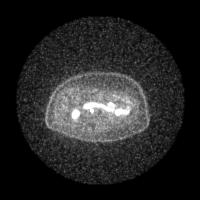
[im 147/245]
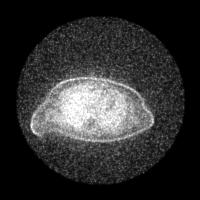
[im 196/245]
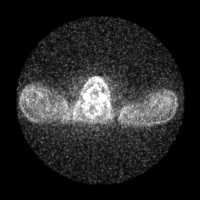
[im 245/245]
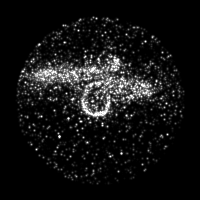

[Series 8: ct sk_thigh 5.0 br59 lung_bone · axial · 5.0mm · 0.62mm/px · 1 of 59 slices shown]
[im 1/59  brain]
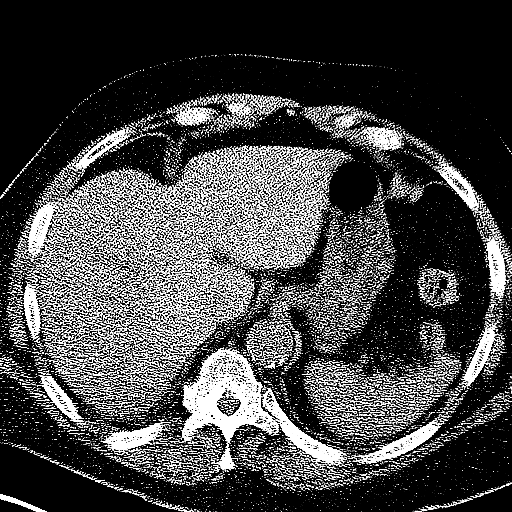

[Series 603: fused cor · 1 of 38 slices shown]
[im 1/38]
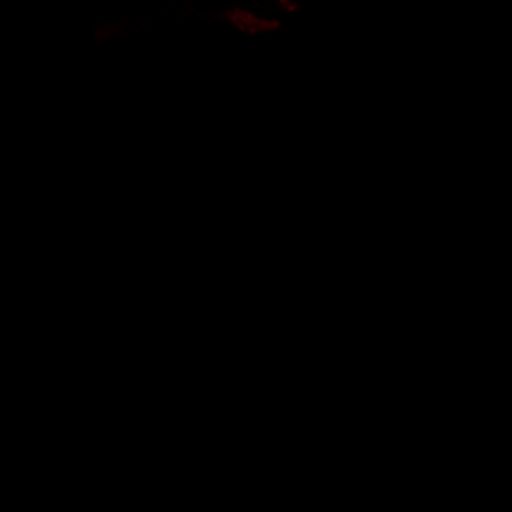

[Series 604: <mip collection> · coronal · 2.02mm/px · 1 of 32 slices shown]
[im 1/32]
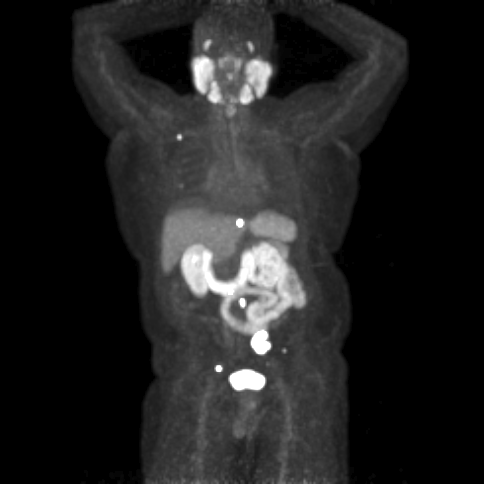

[Series 605: range-ct sk_thigh 5.0 bf37-tra-<alpha range> · 5 of 239 slices shown]
[im 1/239]
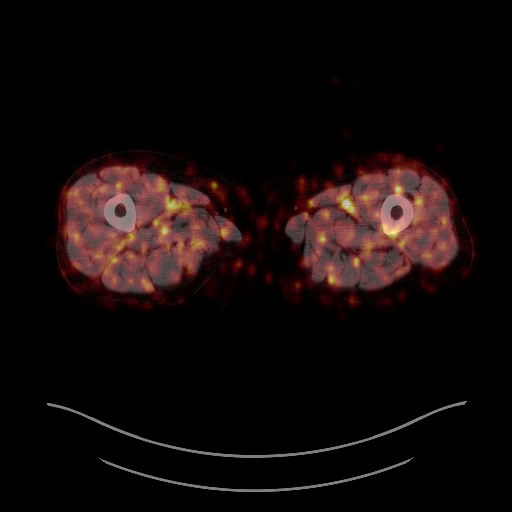
[im 60/239]
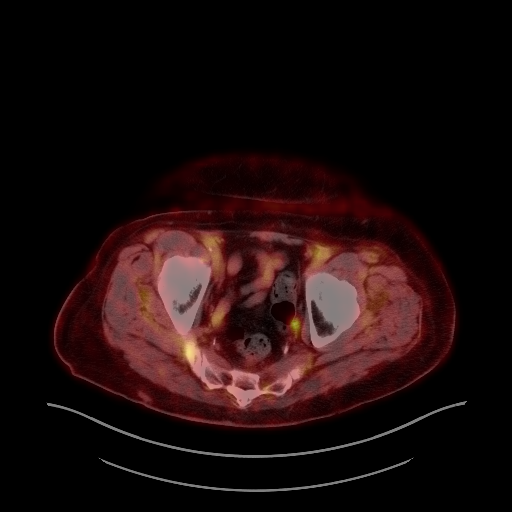
[im 120/239]
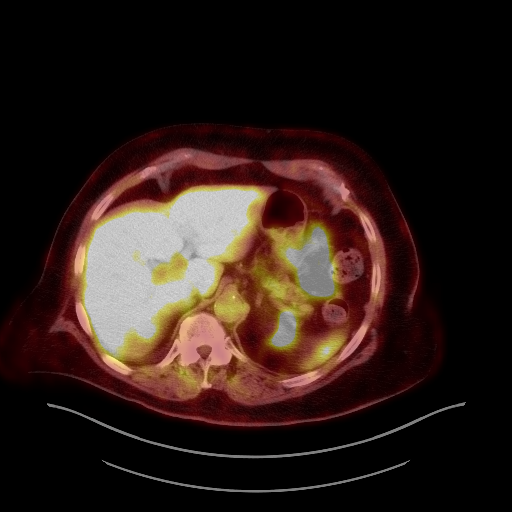
[im 179/239]
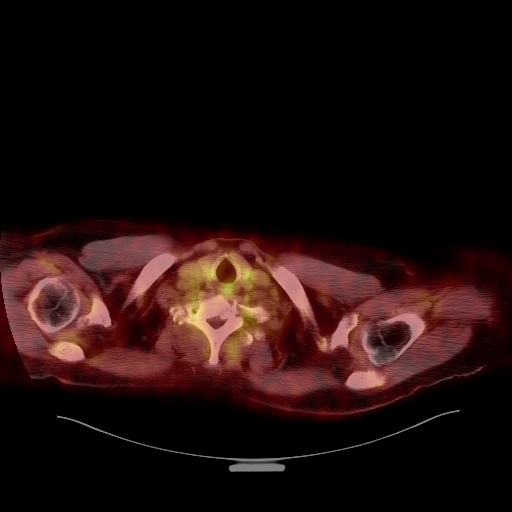
[im 239/239]
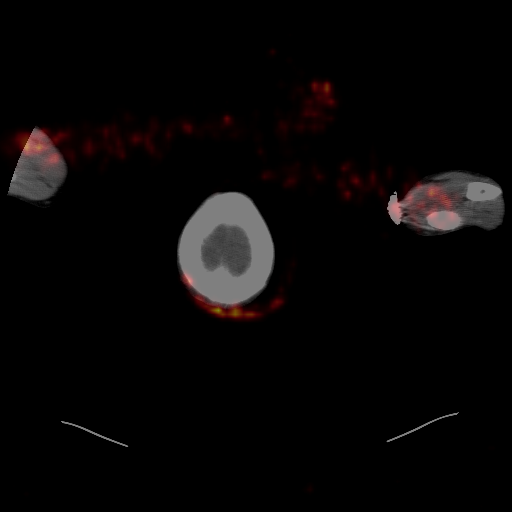

[25 of 25 positions shown; findings below may reference images not displayed]

FINDINGS: NECK

No abnormal radiotracer activity in cervical nodes.

Incidental CT finding: None

CHEST

No radiotracer accumulation within mediastinal or hilar lymph nodes.
No suspicious pulmonary nodules on the CT scan.

Incidental CT finding: Coronary artery calcification and aortic
atherosclerotic calcification.

ABDOMEN/PELVIS

Prostate: Brachytherapy seeds within the prostate gland. No abnormal
focal activity.

Lymph nodes: Enlarged intensely radiotracer avid LEFT iliac lymph
node measures 33 mm with SUV max equal 285.

Wispy nodal material adjacent to the distal abdominal aorta just
above the bifurcation of the LEFT (image 153/series 4) has intense
radiotracer activity with SUV max equal 70.9. This node is barely
measurable at 1 mm.

A similar 4 mm node between the IVC and aorta on image 148 SUV max
equal 118.

Intense physiologic activity noted in the small bowel.

Liver: No evidence of liver metastasis

Incidental CT finding: Atherosclerotic calcification of the aorta.

SKELETON

Intense focal activity within the anterior margin of the RIGHT
acetabulum with SUV max equal 95.6. No clear CT lesion. Small lesion
in the LEFT iliac bone additionally.

Intense radiotracer activity in LEFT transverse process of the T11
vertebral body with SUV max equal 29. Focal lesion in the spine of
the RIGHT scapula with SUV max equal 18.5.
IMPRESSION: 1. Single bulky LEFT external iliac node with intense metabolic
activity. Very small (less than 5 mm) periaortic retroperitoneal
nodes with intense radiotracer activity. Findings consistent with
prostate cancer nodal metastasis.
2. Several small foci of intense radiotracer activity within the
skeleton consistent skeletal metastasis. Lesions involve the RIGHT
scapula, T11 vertebral body, and RIGHT acetabulum. Lesions are
occult by CT imaging. Additionally lesions not identified on
comparison MDP bone scan.

## 2022-01-07 MED FILL — Dexamethasone Sodium Phosphate Inj 100 MG/10ML: INTRAMUSCULAR | Qty: 1 | Status: AC

## 2022-01-08 ENCOUNTER — Inpatient Hospital Stay (HOSPITAL_COMMUNITY): Payer: Medicare HMO | Attending: Hematology

## 2022-01-08 ENCOUNTER — Inpatient Hospital Stay (HOSPITAL_COMMUNITY): Payer: Medicare HMO

## 2022-01-08 ENCOUNTER — Inpatient Hospital Stay (HOSPITAL_BASED_OUTPATIENT_CLINIC_OR_DEPARTMENT_OTHER): Payer: Medicare HMO | Admitting: Hematology

## 2022-01-08 VITALS — BP 138/62 | HR 68 | Temp 97.0°F | Resp 18

## 2022-01-08 DIAGNOSIS — C7951 Secondary malignant neoplasm of bone: Secondary | ICD-10-CM | POA: Insufficient documentation

## 2022-01-08 DIAGNOSIS — C61 Malignant neoplasm of prostate: Secondary | ICD-10-CM | POA: Insufficient documentation

## 2022-01-08 DIAGNOSIS — Z7984 Long term (current) use of oral hypoglycemic drugs: Secondary | ICD-10-CM | POA: Diagnosis not present

## 2022-01-08 DIAGNOSIS — Z95828 Presence of other vascular implants and grafts: Secondary | ICD-10-CM

## 2022-01-08 DIAGNOSIS — Z7982 Long term (current) use of aspirin: Secondary | ICD-10-CM | POA: Insufficient documentation

## 2022-01-08 DIAGNOSIS — Z5111 Encounter for antineoplastic chemotherapy: Secondary | ICD-10-CM | POA: Insufficient documentation

## 2022-01-08 DIAGNOSIS — C775 Secondary and unspecified malignant neoplasm of intrapelvic lymph nodes: Secondary | ICD-10-CM

## 2022-01-08 DIAGNOSIS — R69 Illness, unspecified: Secondary | ICD-10-CM | POA: Diagnosis not present

## 2022-01-08 DIAGNOSIS — Z79899 Other long term (current) drug therapy: Secondary | ICD-10-CM | POA: Insufficient documentation

## 2022-01-08 DIAGNOSIS — R197 Diarrhea, unspecified: Secondary | ICD-10-CM | POA: Diagnosis not present

## 2022-01-08 DIAGNOSIS — F17210 Nicotine dependence, cigarettes, uncomplicated: Secondary | ICD-10-CM | POA: Diagnosis not present

## 2022-01-08 DIAGNOSIS — E860 Dehydration: Secondary | ICD-10-CM

## 2022-01-08 LAB — CBC WITH DIFFERENTIAL/PLATELET
Abs Immature Granulocytes: 0.02 10*3/uL (ref 0.00–0.07)
Basophils Absolute: 0.1 10*3/uL (ref 0.0–0.1)
Basophils Relative: 1 %
Eosinophils Absolute: 0.1 10*3/uL (ref 0.0–0.5)
Eosinophils Relative: 1 %
HCT: 30 % — ABNORMAL LOW (ref 39.0–52.0)
Hemoglobin: 9.9 g/dL — ABNORMAL LOW (ref 13.0–17.0)
Immature Granulocytes: 0 %
Lymphocytes Relative: 39 %
Lymphs Abs: 2.6 10*3/uL (ref 0.7–4.0)
MCH: 32.6 pg (ref 26.0–34.0)
MCHC: 33 g/dL (ref 30.0–36.0)
MCV: 98.7 fL (ref 80.0–100.0)
Monocytes Absolute: 0.5 10*3/uL (ref 0.1–1.0)
Monocytes Relative: 7 %
Neutro Abs: 3.5 10*3/uL (ref 1.7–7.7)
Neutrophils Relative %: 52 %
Platelets: 164 10*3/uL (ref 150–400)
RBC: 3.04 MIL/uL — ABNORMAL LOW (ref 4.22–5.81)
RDW: 16.5 % — ABNORMAL HIGH (ref 11.5–15.5)
WBC: 6.7 10*3/uL (ref 4.0–10.5)
nRBC: 0 % (ref 0.0–0.2)

## 2022-01-08 LAB — PSA: Prostatic Specific Antigen: 12.71 ng/mL — ABNORMAL HIGH (ref 0.00–4.00)

## 2022-01-08 LAB — COMPREHENSIVE METABOLIC PANEL
ALT: 15 U/L (ref 0–44)
AST: 16 U/L (ref 15–41)
Albumin: 3.1 g/dL — ABNORMAL LOW (ref 3.5–5.0)
Alkaline Phosphatase: 55 U/L (ref 38–126)
Anion gap: 7 (ref 5–15)
BUN: 30 mg/dL — ABNORMAL HIGH (ref 8–23)
CO2: 18 mmol/L — ABNORMAL LOW (ref 22–32)
Calcium: 8.3 mg/dL — ABNORMAL LOW (ref 8.9–10.3)
Chloride: 112 mmol/L — ABNORMAL HIGH (ref 98–111)
Creatinine, Ser: 1.52 mg/dL — ABNORMAL HIGH (ref 0.61–1.24)
GFR, Estimated: 47 mL/min — ABNORMAL LOW (ref >60)
Glucose, Bld: 133 mg/dL — ABNORMAL HIGH (ref 70–99)
Potassium: 3.4 mmol/L — ABNORMAL LOW (ref 3.5–5.1)
Sodium: 137 mmol/L (ref 135–145)
Total Bilirubin: 0.5 mg/dL (ref 0.3–1.2)
Total Protein: 6.5 g/dL (ref 6.5–8.1)

## 2022-01-08 LAB — MAGNESIUM: Magnesium: 1.4 mg/dL — ABNORMAL LOW (ref 1.7–2.4)

## 2022-01-08 MED ORDER — SODIUM CHLORIDE 0.9 % IV SOLN: Freq: Once | INTRAVENOUS | Status: AC

## 2022-01-08 MED ORDER — SODIUM CHLORIDE 0.9 % IV SOLN
10.0000 mg | Freq: Once | INTRAVENOUS | Status: AC
  Administered 2022-01-08: 10 mg via INTRAVENOUS
  Filled 2022-01-08: qty 10

## 2022-01-08 MED ORDER — SODIUM CHLORIDE 0.9 % IV SOLN: INTRAVENOUS | Status: AC

## 2022-01-08 MED ORDER — SODIUM CHLORIDE 0.9% FLUSH
10.0000 mL | INTRAVENOUS | Status: DC | PRN
  Administered 2022-01-08: 10 mL

## 2022-01-08 MED ORDER — HEPARIN SOD (PORK) LOCK FLUSH 100 UNIT/ML IV SOLN
500.0000 [IU] | Freq: Once | INTRAVENOUS | Status: AC | PRN
  Administered 2022-01-08: 500 [IU]

## 2022-01-08 MED ORDER — SODIUM CHLORIDE 0.9 % IV SOLN
50.0000 mg/m2 | Freq: Once | INTRAVENOUS | Status: AC
  Administered 2022-01-08: 100 mg via INTRAVENOUS
  Filled 2022-01-08: qty 10

## 2022-01-08 MED ORDER — PALONOSETRON HCL INJECTION 0.25 MG/5ML
0.2500 mg | Freq: Once | INTRAVENOUS | Status: AC
  Administered 2022-01-08: 0.25 mg via INTRAVENOUS
  Filled 2022-01-08: qty 5

## 2022-01-08 NOTE — Progress Notes (Unsigned)
Orders received to give docetaxel today at 50 mg/m2  Dose adjusted  T.O Dr Rhys Martini, PharmD

## 2022-01-08 NOTE — Progress Notes (Unsigned)
Patient okay for treatment today per Dr. Delton Coombes, with an additional order for 575m of normal saline over 1 hour.  Patient tolerated chemotherapy with no complaints voiced. Side effects with management reviewed understanding verbalized. Port site clean and dry with no bruising or swelling noted at site. Good blood return noted before and after administration of chemotherapy. Band aid applied. Patient left in satisfactory condition with VSS and no s/s of distress noted.

## 2022-01-08 NOTE — Progress Notes (Signed)
Andrew [name], Andrew [name] [id number]   CLINIC:  Medical Oncology/Hematology  PCP:  [name], MD [address] 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 Report date is 05/07/2021.  The Multi-Cancer + RNA Panel offered by [facility] includes sequencing and/or deletion/duplication analysis of the following 84 genes:  AIP*, ALK, APC*, ATM*, AXIN2*, BAP1*, BARD1*, BLM*, BMPR1A*, BRCA1*, BRCA2*, BRIP1*, CASR, CDC73*, CDH1*, CDK4, CDKN1B*, CDKN1C*, CDKN2A, CEBPA, CHEK2*, CTNNA1*, DICER1*, DIS3L2*, EGFR, EPCAM, FH*, FLCN*, GATA2*, GPC3, GREM1, HOXB13, HRAS, KIT, MAX*, MEN1*, MET, MITF, MLH1*, MSH2*, MSH3*, MSH6*, MUTYH*, NBN*, NF1*, NF2*, NTHL1*, PALB2*, PDGFRA, PHOX2B, PMS2*, POLD1*, POLE*, POT1*, PRKAR1A*, PTCH1*, PTEN*, RAD50*, RAD51C*, RAD51D*, RB1*, RECQL4, RET, RUNX1*, SDHA*, SDHAF2*, SDHB*, SDHC*, SDHD*, SMAD4*, SMARCA4*, SMARCB1*, SMARCE1*, STK11*, SUFU*, TERC, TERT, TMEM127*, Tp53*, TSC1*, TSC2*, VHL*, WRN*, and WT1.  RNA analysis is performed for * genes.   10/21/2021 -  Chemotherapy   Patient is on Treatment Plan : PROSTATE Docetaxel + Prednisone q21d       CANCER STAGING:  Cancer Staging  Prostate cancer [facility]) Staging form: Prostate, [name] 7th Edition - Clinical stage from 04/02/2021: rT2b, N1, PSA: 10 to 19, Gleason 7 - Unsigned   INTERVAL  HISTORY:  [name], a 76 y.o. male, returns for routine follow-up and consideration for next cycle of chemotherapy. Martino was last seen on 12/23/2021.  Due for cycle #3 of Taxotere today.   Overall, he tells me he has been feeling pretty well. He has gained 2 lbs since his last visit. He is drinking 2 cans of Ensure with 3 small meals daily. He reports his energy levels are good. He denies tingling/numbness, nausea, and vomiting. He reports occasional diarrhea.   Overall, he feels ready for next cycle of chemo today.    REVIEW OF SYSTEMS:  Review of Systems  Constitutional:  Negative for appetite change and fatigue.  Gastrointestinal:  Positive for diarrhea. Negative for nausea and vomiting.  Neurological:  Negative for numbness.  All other systems reviewed and are negative.   PAST MEDICAL/SURGICAL HISTORY:  Past Medical History:  Diagnosis Date   AKI (acute kidney injury) ([place]) 10/04/2021   Anemia    BPH (benign prostatic hyperplasia)    Sees Dr [name]   Cataract    Diabetes mellitus without complication [facility])    Educated about COVID-19 virus infection 07/18/2019   Genetic testing 05/09/2021   [facility] Multi-Cancer Panel was Negative. Report date is 05/07/2021.  The Multi-Cancer + RNA Panel offered by [facility] includes sequencing and/or deletion/duplication analysis of the following 84 genes:  AIP*, ALK, APC*, ATM*, AXIN2*, BAP1*, BARD1*, BLM*, BMPR1A*, BRCA1*, BRCA2*, BRIP1*, CASR, CDC73*, CDH1*, CDK4, CDKN1B*, CDKN1C*, CDKN2A, CEBPA, CHEK2*, CTNNA1*, DICER1*, DIS3L2*, EGFR, EPCAM, FH*, F   Hyperlipidemia    Hypertension    Low serum vitamin D    Port-A-Cath in place 10/09/2021   Prostate cancer ([place]) 2008   w/seed implantation  and radiation   Past Surgical History:  Procedure Laterality Date   COLONOSCOPY  2015,2018   PORTACATH PLACEMENT Right 10/17/2021   Procedure: INSERTION PORT-A-CATH, [name];  Surgeon: [name], DO;  Location: [name];  Service: General;   Laterality: Right;  pt needs to have glucose checked AM of surgery   PROSTATE SURGERY  2008   seed implant    SOCIAL HISTORY:  Social History   Socioeconomic History   Marital status: Married    Spouse name: Beacon   Number of children: 2   Years of education: 12   Highest education level: 12th grade  Occupational History   Occupation: [name]    Comment: Retired Geologist, engineering  Tobacco Use   Smoking status: Every Day    Packs/day: 1.00    Years: 55.00    Total pack years: 55.00    Types: Cigarettes    Start date: 06/30/1965   Smokeless tobacco: Never   Tobacco comments:    Has tried nicotine replacement gum  Vaping Use   Vaping Use: Never used  Substance and Sexual Activity   Alcohol use: No   Drug use: No   Sexual activity: Yes  Other Topics Concern   Not on file  Social History Narrative   Retired, lives at home with wife [name], two grown children, enjoys gardening     Social Determinants of Health   Financial Resource Strain: Low Risk  (01/24/2021)   Overall Financial Resource Strain (CARDIA)    Difficulty of Paying Living Expenses: Not hard at all  Food Insecurity: No Food Insecurity (01/24/2021)   Hunger Vital Sign    Worried About Running Out of Food in the Last Year: Never true    Bear Dance in the Last Year: Never true  Transportation Needs: No Transportation Needs (01/24/2021)   PRAPARE - Hydrologist (Medical): No    Lack of Transportation (Non-Medical): No  Physical Activity: Inactive (01/24/2021)   Exercise Vital Sign    Days of Exercise per Week: 0 days    Minutes of Exercise per Session: 0 min  Stress: No Stress Concern Present (01/24/2021)   [place]    Feeling of Stress : Not at all  Social Connections: [place] (01/24/2021)   Social Connection and Isolation Panel [NHANES]    Frequency of Communication with Friends and Family: More  than three times a week    Frequency of Social Gatherings with Friends and Family: More than three times a week    Attends Religious Services: More than 4 times per year    Active Member of [employer] or Organizations: Yes    Attends Music therapist: More than 4 times per year    Marital Status: Married  Human resources officer Violence: Not At Risk (01/24/2021)   Humiliation, Afraid, Rape, and Kick questionnaire    Fear of Current or Ex-Partner: No    Emotionally Abused: No    Physically Abused: No    Sexually Abused: No    FAMILY HISTORY:  Family History  Problem Relation Age of Onset   Diabetes Mother    Heart disease Mother    Kidney disease Mother        DIALYSIS   Emphysema Father    Deep vein thrombosis Sister    Kidney disease Sister    Lung cancer Sister    Colon polyps Brother    Hypertension Brother  Gout Brother    Colon cancer Neg Hx    Esophageal cancer Neg Hx    Rectal cancer Neg Hx    Prostate cancer Neg Hx     CURRENT MEDICATIONS:  Current Outpatient Medications  Medication Sig Dispense Refill   alfuzosin (UROXATRAL) 10 MG 24 hr tablet Take 1 tablet (10 mg total) by mouth at bedtime. 90 tablet 3   ammonium lactate (AMLACTIN) 12 % cream APPLY TWICE DAILY TO DRY SKIN ON FEET     aspirin 81 MG tablet Take 1 tablet (81 mg total) by mouth daily with breakfast. 30 tablet 3   atorvastatin (LIPITOR) 40 MG tablet Take 1 tablet (40 mg total) by mouth daily. (Patient taking differently: Take 40 mg by mouth at bedtime.) 90 tablet 3   azelastine (ASTELIN) 0.1 % nasal spray Place 1 spray into both nostrils 2 (two) times daily as needed for allergies.     BOOSTRIX 5-2.5-18.5 LF-MCG/0.5 injection      cholecalciferol (VITAMIN D) 1000 UNITS tablet Take 1,000 Units by mouth daily.      Dapagliflozin-sAXagliptin (QTERN) 10-5 MG TABS Take 1 tablet by mouth in the morning. 90 tablet 5   DOCEtaxel (TAXOTERE IV) Inject into the vein every 21 ( twenty-one) days.     docusate  sodium (COLACE) 100 MG capsule Take 1 capsule (100 mg total) by mouth 2 (two) times daily. 60 capsule 2   dronabinol (MARINOL) 5 MG capsule Take 1 capsule (5 mg total) by mouth 2 (two) times daily before a meal. 60 capsule 3   escitalopram (LEXAPRO) 10 MG tablet Take 1 tablet (10 mg total) by mouth daily. 90 tablet 1   FARXIGA 10 MG TABS tablet Take 10 mg by mouth daily.     glucose blood test strip 1 each by Other route as needed for other. Use as instructed to test blood sugar daily.  One Touch Verio Test Strips DX: E11.65 100 each 11   lidocaine-prilocaine (EMLA) cream Apply a small amount to port a cath site and cover with plastic wrap 1 hour prior to chemotherapy appointments 30 g 3   lisinopril-hydrochlorothiazide (ZESTORETIC) 20-25 MG tablet Take 1 tablet by mouth daily. 90 tablet 3   magnesium oxide (MAG-OX) 400 (240 Mg) MG tablet Take 1 tablet (400 mg total) by mouth 2 (two) times daily. 60 tablet 2   megestrol (MEGACE) 400 MG/10ML suspension Take 10 mLs (400 mg total) by mouth 2 (two) times daily. 480 mL 4   metFORMIN (GLUCOPHAGE) 1000 MG tablet Take 1 tablet (1,000 mg total) by mouth 2 (two) times daily with a meal. 180 tablet 2   oxyCODONE (ROXICODONE) 5 MG immediate release tablet Take 1 tablet (5 mg total) by mouth every 8 (eight) hours as needed. 10 tablet 0   potassium chloride SA (KLOR-CON M) 20 MEQ tablet Take 1 tablet (20 mEq total) by mouth daily. 30 tablet 6   pramoxine (PROCTOFOAM) 1 % foam Place 1 application. rectally 3 (three) times daily as needed for anal itching. 15 g 0   prochlorperazine (COMPAZINE) 10 MG tablet Take 1 tablet (10 mg total) by mouth every 6 (six) hours as needed (Nausea or vomiting). 30 tablet 2   No current facility-administered medications for this visit.    ALLERGIES:  No Known Allergies  PHYSICAL EXAM:  Performance status (ECOG): 1 - Symptomatic but completely ambulatory  There were no vitals filed for this visit. Wt Readings from Last 3  Encounters:  12/23/21 174 lb (78.9  kg)  12/09/21 176 lb 8 oz (80.1 kg)  12/03/21 179 lb 9.6 oz (81.5 kg)   Physical Exam Vitals reviewed.  Constitutional:      Appearance: Normal appearance.  Cardiovascular:     Rate and Rhythm: Normal rate and regular rhythm.     Pulses: Normal pulses.     Heart sounds: Normal heart sounds.  Pulmonary:     Effort: Pulmonary effort is normal.     Breath sounds: Normal breath sounds.  Neurological:     General: No focal deficit present.     Mental Status: He is alert and oriented to person, place, and time.  Psychiatric:        Mood and Affect: Mood normal.        Behavior: Behavior normal.     LABORATORY DATA:  I have reviewed the labs as listed.     Latest Ref Rng & Units 12/23/2021    8:30 AM 12/09/2021    8:13 AM 12/02/2021    9:11 AM  CBC  WBC 4.0 - 10.5 K/uL 8.0  8.6  7.9   Hemoglobin 13.0 - 17.0 g/dL 10.0  9.1  8.8   Hematocrit 39.0 - 52.0 % 30.6  27.9  26.3   Platelets 150 - 400 K/uL 257  360  312       Latest Ref Rng & Units 12/23/2021    8:30 AM 12/09/2021    8:13 AM 12/02/2021    9:11 AM  CMP  Glucose 70 - 99 mg/dL 106  96  99   BUN 8 - 23 mg/dL _0 Creatinine 0.61 - 1.24 mg/dL 1.29  1.28  1.35   Sodium 135 - 145 mmol/L 135  138  135   Potassium 3.5 - 5.1 mmol/L 4.1  3.6  3.3   Chloride 98 - 111 mmol/L 107  107  107   CO2 22 - 32 mmol/L _1 Calcium 8.9 - 10.3 mg/dL 8.7  8.6  8.1   Total Protein 6.5 - 8.1 g/dL 7.0  7.1  7.1   Total Bilirubin 0.3 - 1.2 mg/dL 0.8  0.8  0.6   Alkaline Phos 38 - 126 U/L 64  75  76   AST 15 - 41 U/L _2 ALT 0 - 44 U/L _3 DIAGNOSTIC IMAGING:  I have independently reviewed the scans and discussed with the patient. CT Abdomen Pelvis W Contrast  Result Date: 01/02/2022 CLINICAL DATA:  Metastatic prostate cancer, ongoing chemotherapy, assess treatment response, * Tracking Code: BO * EXAM: CT ABDOMEN AND PELVIS WITH CONTRAST TECHNIQUE: Multidetector CT imaging  of the abdomen and pelvis was performed using the standard protocol following bolus administration of intravenous contrast. RADIATION DOSE REDUCTION: This exam was performed according to the departmental dose-optimization program which includes automated exposure control, adjustment of the mA and/or kV according to patient size and/or use of iterative reconstruction technique. CONTRAST:  90m OMNIPAQUE IOHEXOL 300 MG/ML SOLN, additional oral enteric contrast COMPARISON:  CT abdomen pelvis, 10/09/2021, PET-CT, 04/24/2021 FINDINGS: Lower chest: No acute abnormality.  Coronary artery calcifications. Hepatobiliary: No solid liver abnormality is seen. No gallstones, gallbladder wall thickening, or biliary dilatation. Pancreas: Unremarkable. No pancreatic ductal dilatation or surrounding inflammatory changes. Spleen: Normal in size without significant abnormality. Adrenals/Urinary Tract: Stable, definitively benign fat containing small right adrenal adenoma (series 2, image 25). Simple, benign bilateral renal  cysts, for which no further follow-up or characterization is required. Kidneys are otherwise normal, without renal calculi, solid lesion, or hydronephrosis. Bladder is unremarkable. Stomach/Bowel: Stomach is within normal limits. Appendix appears normal. No evidence of bowel wall thickening, distention, or inflammatory changes. Descending colonic diverticula. Vascular/Lymphatic: Aortic atherosclerosis. Unchanged bulky lymph node at the left iliac bifurcation measuring 3.8 x 3.2 cm (series 2, image 63). No other enlarged abdominal or pelvic lymph nodes. Reproductive: Prostate brachytherapy. Other: No abdominal wall hernia or abnormality. No ascites. Musculoskeletal: No acute or significant osseous findings. Gentle dextroscoliosis of the thoracolumbar spine. IMPRESSION: 1. Unchanged bulky lymph node at the left iliac bifurcation. 2. No new lymphadenopathy or soft tissue metastatic disease in the abdomen or pelvis. 3.  Previously identified PET avid osseous metastatic disease is not appreciated by CT. 4. Prostate brachytherapy. 5. Coronary artery disease. Aortic Atherosclerosis (ICD10-I70.0). Electronically Signed   By: Delanna Ahmadi M.D.   On: 01/02/2022 13:39     ASSESSMENT:  Metastatic castration refractory prostate cancer to left iliac lymph node: - History of prostate adenocarcinoma, T2b, Gleason 7, PSA 16.6 - Treated with neoadjuvant hormonal therapy, EBRT followed by brachytherapy with iodine 125 seeds in 2010 - Rising PSA levels in February 2019 - Bone scan on 08/10/2017 negative.  CTAP on 08/26/2017 shows enlarged left pelvic sidewall lymph node 2.2 cm. - Lupron initiated on 08/10/2019. - CTAP on 03/09/2020 with left internal/external iliac lymph node measuring 2.1 x 2.8 cm (2.1 x 2.2 cm), craniocaudal 4.1 cm, previously 3.4 cm. - Enzalutamide to 160 mg daily started on 04/17/2020.  Discontinued end of October 2022. - PSA on 02/20/2021 (5.2), 07/11/2020 (2.0), 02/08/2020 (3.1) - CTAP on 03/07/2021 with left iliac bifurcation lymph node measuring 3.5 x 2.6 cm (previously 2.8 x 2.0 cm) - Bone scan on 03/07/2021 negative. - Left pelvic lymph node biopsy on 04/08/2021, metastatic prostate cancer. - We have reviewed PSMA PET scan results which showed enlarged left iliac lymph node 33 mm with SUV 285.  1 mm lymph node and 4 mm node between IVC and aorta.  Activity in the right acetabulum, left iliac bone, left transverse process of T11 vertebral body, spine of the right scapula. - Guardant 360 did not show any targetable mutations.  Did not show MSI high. - NGS testing could not be done because of less than 100 tumor cells. - Abiraterone and prednisone from 07/15/2021 through 10/01/2021 with progression. - Germline mutation [facility] testing was negative. - Cycle 1 of docetaxel on 10/21/2021.   PLAN:  Metastatic castration refractory prostate cancer to the left iliac lymph node: - His chemotherapy after 2 cycles was on  hold due to weight loss. - CTAP (01/02/2022): Unchanged bulky lymph node at the left iliac bifurcation with no new findings. - Last PSA was 7.23.  CBC today normal white count and platelet count.  LFTs were grossly normal. - He will proceed with docetaxel dose reduced at 50 mg/M square cycle 3 today. - RTC 3 weeks for follow-up.  We will follow-up on the PSA from today.  2.  Weight loss: - He lost about 9 pounds after cycle 2 and 13 pounds after cycle 1. - He is taking Megace.  He gained about 2 pounds.  He is drinking 2 cans of Ensure Plus per day and eating 3 meals per day.  3.  Normocytic anemia: - Hemoglobin today is 9.9.  He completed 3 doses of Venofer.  4.  Bone metastasis: - We will start him on denosumab  after dental evaluation.  5.  Hypomagnesemia: - Continue magnesium twice daily.  He will receive parenteral magnesium today.   Orders placed this encounter:  No orders of the defined types were placed in this encounter.    Derek Jack, MD [place] [phone]   I, Thana Ates, am acting as a scribe for Dr. Derek Jack.  I, Derek Jack MD, have reviewed the above documentation for accuracy and completeness, and I agree with the above.

## 2022-01-08 NOTE — Patient Instructions (Addendum)
[facility] Discharge Instructions   You were seen and examined today by Dr. Katragadda.  He reviewed the results of your lab work and CT scan. All results are normal/stable.   We will proceed with your treatment today.   Return as scheduled.    Thank you for choosing [facility] at [facility] to provide your oncology and hematology care.  To afford each patient quality time with our provider, please arrive at least 15 minutes before your scheduled appointment time.   If you have a lab appointment with the Cancer Center please come in thru the Main Entrance and check in at the main information desk.  You need to re-schedule your appointment should you arrive 10 or more minutes late.  We strive to give you quality time with our providers, and arriving late affects you and other patients whose appointments are after yours.  Also, if you no show three or more times for appointments you may be dismissed from the clinic at the providers discretion.     Again, thank you for choosing [facility].  Our hope is that these requests will decrease the amount of time that you wait before being seen by our physicians.       _____________________________________________________________  Should you have questions after your visit to [facility], please contact our office at [phone] and follow the prompts.  Our office hours are 8:00 a.m. and 4:30 p.m. Monday - Friday.  Please note that voicemails left after 4:00 p.m. may not be returned until the following business day.  We are closed weekends and major holidays.  You do have access to a nurse 24-7, just call the main number to the clinic [phone] and do not press any options, hold on the line and a nurse will answer the phone.    For prescription refill requests, have your pharmacy contact our office and allow 72 hours.    Due to Covid, you will need to wear a  mask upon entering the hospital. If you do not have a mask, a mask will be given to you at the Main Entrance upon arrival. For doctor visits, patients may have 1 support person age 18 or older with them. For treatment visits, patients can not have anyone with them due to social distancing guidelines and our immunocompromised population.      

## 2022-01-08 NOTE — Progress Notes (Signed)
Patients port flushed without difficulty.  Good blood return noted with no bruising or swelling noted at site.  Stable during access and blood draw.  Patient to remain accessed for treatment. 

## 2022-01-08 NOTE — Patient Instructions (Signed)
Andrew [name]  Discharge Instructions: Thank you for choosing [facility] to provide your oncology and hematology care.  If you have a lab appointment with the [facility], please come in thru the Main Entrance and check in at the main information desk.  Wear comfortable clothing and clothing appropriate for easy access to any Portacath or PICC line.   We strive to give you quality time with your provider. You may need to reschedule your appointment if you arrive late (15 or more minutes).  Arriving late affects you and other patients whose appointments are after yours.  Also, if you miss three or more appointments without notifying the office, you may be dismissed from the clinic at the provider's discretion.      For prescription refill requests, have your pharmacy contact our office and allow 72 hours for refills to be completed.    Today you received the following chemotherapy and/or immunotherapy agents Taxotere, return as scheduled.   To help prevent nausea and vomiting after your treatment, we encourage you to take your nausea medication as directed.  BELOW ARE SYMPTOMS THAT SHOULD BE REPORTED IMMEDIATELY: *FEVER GREATER THAN 100.4 F (38 C) OR HIGHER *CHILLS OR SWEATING *NAUSEA AND VOMITING THAT IS NOT CONTROLLED WITH YOUR NAUSEA MEDICATION *UNUSUAL SHORTNESS OF BREATH *UNUSUAL BRUISING OR BLEEDING *URINARY PROBLEMS (pain or burning when urinating, or frequent urination) *BOWEL PROBLEMS (unusual diarrhea, constipation, pain near the anus) TENDERNESS IN MOUTH AND THROAT WITH OR WITHOUT PRESENCE OF ULCERS (sore throat, sores in mouth, or a toothache) UNUSUAL RASH, SWELLING OR PAIN  UNUSUAL VAGINAL DISCHARGE OR ITCHING   Items with * indicate a potential emergency and should be followed up as soon as possible or go to the Emergency Department if any problems should occur.  Please show the CHEMOTHERAPY ALERT CARD or IMMUNOTHERAPY ALERT CARD at check-in to  the Emergency Department and triage nurse.  Should you have questions after your visit or need to cancel or reschedule your appointment, please contact [facility] [phone]  and follow the prompts.  Office hours are 8:00 a.m. to 4:30 p.m. Monday - Friday. Please note that voicemails left after 4:00 p.m. may not be returned until the following business day.  We are closed weekends and major holidays. You have access to a nurse at all times for urgent questions. Please call the main number to the clinic [phone] and follow the prompts.  For any non-urgent questions, you may also contact your provider using MyChart. We now offer e-Visits for anyone 5 and older to request care online for non-urgent symptoms. For details visit [email or web address].   Also download the MyChart app! Go to the app store, search "MyChart", open the app, select [facility], and log in with your MyChart username and password.  Masks are optional in the cancer centers. If you would like for your care team to wear a mask while they are taking care of you, please let them know. For doctor visits, patients may have with them one support person who is at least 76 years old. At this time, visitors are not allowed in the infusion area.

## 2022-01-09 ENCOUNTER — Ambulatory Visit: Payer: Medicare HMO | Admitting: *Deleted

## 2022-01-09 DIAGNOSIS — E118 Type 2 diabetes mellitus with unspecified complications: Secondary | ICD-10-CM

## 2022-01-09 NOTE — Chronic Care Management (AMB) (Signed)
Care Management    RN Visit Note  01/09/2022 Name: Andrew [name] MRN: [id number] DOB: [birth year]  Subjective: Andrew [name] is a 76 y.o. year old male who is a primary care patient of Stacks, Cletus Gash, MD. The care management team was consulted for assistance with disease management and care coordination needs.    Engaged with patient by telephone for follow up visit in response to provider referral for case management and/or care coordination services.   Consent to Services:   [name] was given information about Care Management services today including:  Care Management services includes personalized support from designated clinical staff supervised by his physician, including individualized plan of care and coordination with other care providers 24/7 contact phone numbers for assistance for urgent and routine care needs. The patient may stop case management services at any time by phone call to the office staff.  Patient agreed to services and consent obtained.   Assessment: Review of patient past medical history, allergies, medications, health status, including review of consultants reports, laboratory and other test data, was performed as part of comprehensive evaluation and provision of chronic care management services.   SDOH (Social Determinants of Health) assessments and interventions performed:    Care Plan  No Known Allergies  Outpatient Encounter Medications as of 01/09/2022  Medication Sig Note   alfuzosin (UROXATRAL) 10 MG 24 hr tablet Take 1 tablet (10 mg total) by mouth at bedtime.    ammonium lactate (AMLACTIN) 12 % cream APPLY TWICE DAILY TO DRY SKIN ON FEET    aspirin 81 MG tablet Take 1 tablet (81 mg total) by mouth daily with breakfast.    atorvastatin (LIPITOR) 40 MG tablet Take 1 tablet (40 mg total) by mouth daily. (Patient taking differently: Take 40 mg by mouth at bedtime.)    azelastine (ASTELIN) 0.1 % nasal spray Place 1 spray into both nostrils 2 (two) times  daily as needed for allergies.    BOOSTRIX 5-2.5-18.5 LF-MCG/0.5 injection     cholecalciferol (VITAMIN D) 1000 UNITS tablet Take 1,000 Units by mouth daily.     Dapagliflozin-sAXagliptin (QTERN) 10-5 MG TABS Take 1 tablet by mouth in the morning. 12/05/2021: Via [place] patient assistance program    DOCEtaxel (TAXOTERE IV) Inject into the vein every 21 ( twenty-one) days.    docusate sodium (COLACE) 100 MG capsule Take 1 capsule (100 mg total) by mouth 2 (two) times daily.    dronabinol (MARINOL) 5 MG capsule Take 1 capsule (5 mg total) by mouth 2 (two) times daily before a meal.    escitalopram (LEXAPRO) 10 MG tablet Take 1 tablet (10 mg total) by mouth daily.    FARXIGA 10 MG TABS tablet Take 10 mg by mouth daily. 12/05/2021: samples   glimepiride (AMARYL) 2 MG tablet Take 2 mg by mouth 2 (two) times daily.    glucose blood test strip 1 each by Other route as needed for other. Use as instructed to test blood sugar daily.  One Touch Verio Test Strips DX: E11.65 10/17/2019: One Touch Verio   lidocaine-prilocaine (EMLA) cream Apply a small amount to port a cath site and cover with plastic wrap 1 hour prior to chemotherapy appointments (Patient not taking: Reported on 01/08/2022)    lisinopril-hydrochlorothiazide (ZESTORETIC) 20-25 MG tablet Take 1 tablet by mouth daily.    magnesium oxide (MAG-OX) 400 (240 Mg) MG tablet Take 1 tablet (400 mg total) by mouth 2 (two) times daily.    megestrol (MEGACE) 400 MG/10ML suspension  Take 10 mLs (400 mg total) by mouth 2 (two) times daily.    metFORMIN (GLUCOPHAGE) 1000 MG tablet Take 1 tablet (1,000 mg total) by mouth 2 (two) times daily with a meal.    oxyCODONE (ROXICODONE) 5 MG immediate release tablet Take 1 tablet (5 mg total) by mouth every 8 (eight) hours as needed.    potassium chloride SA (KLOR-CON M) 20 MEQ tablet Take 1 tablet (20 mEq total) by mouth daily.    pramoxine (PROCTOFOAM) 1 % foam Place 1 application. rectally 3 (three) times daily as needed  for anal itching.    prochlorperazine (COMPAZINE) 10 MG tablet Take 1 tablet (10 mg total) by mouth every 6 (six) hours as needed (Nausea or vomiting). (Patient not taking: Reported on 01/08/2022)    Facility-Administered Encounter Medications as of 01/09/2022  Medication   0.9 %  sodium chloride infusion   sodium chloride flush (NS) 0.9 % injection 10 mL    Patient Active Problem List   Diagnosis Date Noted   Iron deficiency anemia 12/02/2021   Port-A-Cath in place 10/09/2021   Hyperglycemia due to diabetes mellitus ([place]) 10/04/2021   Aortic atherosclerosis ([place]) 01/18/2021   Primary insomnia 07/26/2020   Sacroiliitis ([place]) 07/26/2020   Nocturia 02/08/2020   Nonspecific abnormal electrocardiogram (ECG) (EKG) 07/18/2019   Type 2 diabetes mellitus with complication, without long-term current use of insulin (HCC) 07/18/2019   SOB (shortness of breath) 12/10/2016   Erectile dysfunction due to arterial insufficiency 10/30/2016   Polyp of colon 10/30/2016   Peripheral vascular insufficiency ([place]) 06/13/2016   Vitamin D deficiency 08/24/2015   Prostate cancer ([place]) 04/07/2014   Type 2 diabetes mellitus without complications ([place]) 67/59/1638   Benign prostatic hyperplasia with urinary obstruction 09/14/2010   Essential hypertension 09/14/2010   Hyperlipidemia associated with type 2 diabetes mellitus ([place]) 09/14/2010   Tobacco abuse 09/14/2010    Conditions to be addressed/monitored: HTN, DMII, and metastatic prostate cancer  Care Plan : [facility] Care Plan  Updates made by Ilean China, RN since 01/09/2022 12:00 AM     Problem: Chronic Diseaes Managment Needs   Priority: High     Long-Range Goal: Work with [facility] Regarding Care Management and Care Coordination Associated with HTN, DM, metastatic prostate cancer   Start Date: 02/22/2021  Expected End Date: 02/22/2022  This Visit's Progress: On track  Recent Progress: On track  Priority: High  Note:   Current Barriers:  Chronic  Disease Management support and education needs related to poor oral health in a patient with hypertension, diabetes, and metastatic prostate cancer Chronic disease management and care coordination needs related to HTN, DM, and metastatic prostate cancer Unable to independently drive   RNCM Clinical Goal(s):  Patient will continue to work with RN Care Manager to address care management and care coordination needs related to HTN, DMII, and , metastatic prostate cancer, poor oral health  through collaboration with RN Care manager/LCSW, provider, and care team.  Patient will keep all oncology follow-up appointments Patient will schedule and keep appointment with dentist for evaluation and treatment  Interventions: 1:1 collaboration with primary care provider regarding development and update of comprehensive plan of care as evidenced by provider attestation and co-signature Inter-disciplinary care team collaboration (see longitudinal plan of care) Evaluation of current treatment plan related to  self management and patient's adherence to plan as established by provider   Diabetes:  (Status: Goal on Track (progressing): YES.)  Lab Results  Component Value Date   HGBA1C 7.9 (H)  10/02/2021   HGBA1C 5.6 05/01/2021   HGBA1C 6.1 01/29/2021   Lab Results  Component Value Date   MICROALBUR 100 11/21/2014   LDLCALC 57 10/02/2021   CREATININE 1.52 (H) 01/08/2022  Assessed patient's understanding of A1c goal: <7% Reviewed and discussed medications Counseled on importance of regular laboratory monitoring as prescribed        Encouraged to continue checking and recording blood sugar daily and as needed       Encouraged to call provider for findings outside established parameters       Discussed home blood sugar monitoring and average blood sugar readings Discussed the impact that stress and other medical conditions can have on blood sugar Discussed home blood sugar testing. Patient reports that is  mostly normal. Discussed diet and appetite Assessed social determinant of health barriers   Hypertension: (Status: Goal on Track (progressing): YES.) Last practice recorded BP readings:  BP Readings from Last 3 Encounters:  01/08/22 138/62  01/08/22 (!) 111/58  12/23/21 113/70  Reviewed medications with patient and discussed importance of medication adherence Encouraged patient to check and record blood pressure at least 3 times per week and to call PCP with any readings outside of recommended range Reinforced low sodium/DASH diet Discussed plans with patient for ongoing care management follow up and provided patient with direct contact information for care management team   Oral Health:  (Status: Patient declined further engagement on this goal.) Reviewed and discussed medications Previously placed referral to [facility] who contacted the patient and provided him with information on the [facility].  Provided with contact number again and encouraged to reach out for an appointment [phone] Reviewed and discussed recent oncology note and plan of care.  Oncologist would like to start monthly injections of denosumab but patient needs to have dental work completed prior to starting. Explained this to patient.  Provided with RN Care Manager contact number and encouraged to reach out as needed   Oncology:  (Status: Goal on track: YES.) Reviewed and discussed recent oncology visits, imaging, and laboratory reports Verified available transportation to appointments and treatments. Has consistent/reliable transportation: Yes Assessed support system. Has consistent/reliable family or other support: Yes Nutrition assessment performed. Appetite waxes and wanes. He is prescribed megace and marinol. He is drinking supplements as needed and trying to eat regularly. Reviewed upcoming appt with Dr Delton Coombes with the [facility] Discussed low iron and iron infusions.  Low iron did impact his ability to have chemo for a few sessions.  Reviewed and discussed medications and importance of compliance Therapeutic listening utilized regarding prognosis and treatment plan Encouraged to reach out to [facility] as needed  Discussed mobility and ability to perform ADLs Discussed energy levels Encouraged to rest as needed but to get in some exercise, like walking, as tolerated. Patient does walk down the street when it's not too hot.  Reached out to PCP for new CCM referral. Patient would like to remain in the program at least for the duration of his cancer treatment. Today's visit will be for care coordination since there is no current referral on file.    SDOH Barriers (Status: Goal Met. Provided with Needed Resource Information)  Patient interviewed and appropriate assessments performed Referred patient to community resources care guide team for assistance with hazards at home: leaking roof Advised patient that he should receive a telephone call from the [facility] team    Patient Goals/Self-Care Activities: Patient will self administer medications as prescribed  Patient will attend church or other social activities Patient will continue to perform ADL's independently Patient will continue to perform IADL's independently Patient will call provider office for new concerns or questions Patient will call RN Care Manager as needed [phone] Patient will keep all medical appointments Patient will check and record blood pressure 3 times per week and will call PCP with any readings outside of recommended range Patient will check and record blood sugar daily and will call PCP with any readings outside of recommended range Continue to eat iron rich foods  Plan: Telephone follow up appointment with care management team member scheduled for:  01/10/22 with RNCM The patient has been provided with contact information for the care management team and has been advised to  call with any health related questions or concerns.   Chong Sicilian, BSN, RN-BC Embedded Chronic Care Manager [facility] / [place] [facility] Direct Dial: [phone]

## 2022-01-09 NOTE — Patient Instructions (Signed)
Visit Information  Thank you for taking time to visit with me today. Please don't hesitate to contact me if I can be of assistance to you before our next scheduled telephone appointment.  Following are the goals we discussed today:  Patient will self administer medications as prescribed Patient will attend church or other social activities Patient will continue to perform ADL's independently Patient will continue to perform IADL's independently Patient will call provider office for new concerns or questions Patient will call [facility] as needed [phone] Patient will keep all medical appointments Patient will check and record blood pressure 3 times per week and will call PCP with any readings outside of recommended range Patient will check and record blood sugar daily and will call PCP with any readings outside of recommended range Continue to eat iron rich foods  Our next appointment is by telephone on 01/10/22 at 12:45  Please call the care guide team at [phone] if you need to cancel or reschedule your appointment.   If you are experiencing a Mental Health or [place] or need someone to talk to, please call the [facility]: [phone] call [phone]   The patient verbalized understanding of instructions, educational materials, and care plan provided today and agreed to receive a mailed copy of patient instructions, educational materials, and care plan.   Chong Sicilian, BSN, RN-BC Embedded Chronic Care Manager [facility] / [facility] Direct Dial: [phone]

## 2022-01-10 ENCOUNTER — Encounter (HOSPITAL_COMMUNITY): Payer: Self-pay

## 2022-01-10 ENCOUNTER — Ambulatory Visit (INDEPENDENT_AMBULATORY_CARE_PROVIDER_SITE_OTHER): Payer: Medicare HMO | Admitting: Pharmacist

## 2022-01-10 ENCOUNTER — Inpatient Hospital Stay (HOSPITAL_COMMUNITY): Payer: Medicare HMO

## 2022-01-10 VITALS — BP 130/73 | HR 88 | Temp 97.4°F | Resp 18

## 2022-01-10 DIAGNOSIS — R69 Illness, unspecified: Secondary | ICD-10-CM | POA: Diagnosis not present

## 2022-01-10 DIAGNOSIS — Z7982 Long term (current) use of aspirin: Secondary | ICD-10-CM | POA: Diagnosis not present

## 2022-01-10 DIAGNOSIS — C61 Malignant neoplasm of prostate: Secondary | ICD-10-CM | POA: Diagnosis not present

## 2022-01-10 DIAGNOSIS — Z79899 Other long term (current) drug therapy: Secondary | ICD-10-CM | POA: Diagnosis not present

## 2022-01-10 DIAGNOSIS — E119 Type 2 diabetes mellitus without complications: Secondary | ICD-10-CM

## 2022-01-10 DIAGNOSIS — R197 Diarrhea, unspecified: Secondary | ICD-10-CM | POA: Diagnosis not present

## 2022-01-10 DIAGNOSIS — C7951 Secondary malignant neoplasm of bone: Secondary | ICD-10-CM | POA: Diagnosis not present

## 2022-01-10 DIAGNOSIS — Z95828 Presence of other vascular implants and grafts: Secondary | ICD-10-CM

## 2022-01-10 DIAGNOSIS — Z5111 Encounter for antineoplastic chemotherapy: Secondary | ICD-10-CM | POA: Diagnosis not present

## 2022-01-10 DIAGNOSIS — C775 Secondary and unspecified malignant neoplasm of intrapelvic lymph nodes: Secondary | ICD-10-CM | POA: Diagnosis not present

## 2022-01-10 DIAGNOSIS — Z7984 Long term (current) use of oral hypoglycemic drugs: Secondary | ICD-10-CM | POA: Diagnosis not present

## 2022-01-10 DIAGNOSIS — E1169 Type 2 diabetes mellitus with other specified complication: Secondary | ICD-10-CM

## 2022-01-10 MED ORDER — PEGFILGRASTIM-JMDB 6 MG/0.6ML ~~LOC~~ SOSY
6.0000 mg | PREFILLED_SYRINGE | Freq: Once | SUBCUTANEOUS | Status: AC
  Administered 2022-01-10: 6 mg via SUBCUTANEOUS
  Filled 2022-01-10: qty 0.6

## 2022-01-10 NOTE — Progress Notes (Signed)
Patient tolerated injection with no complaints voiced.  Site clean and dry with no bruising or swelling noted at site.  See MAR for details.  Band aid applied.  Patient stable during and after injection.  Vss with discharge and left in satisfactory condition with no s/s of distress noted.  

## 2022-01-10 NOTE — Patient Instructions (Signed)
[facility]  Discharge Instructions: Thank you for choosing [facility] to provide your oncology and hematology care.  If you have a lab appointment with the Cancer Center, please come in thru the Main Entrance and check in at the main information desk.  Wear comfortable clothing and clothing appropriate for easy access to any Portacath or PICC line.   We strive to give you quality time with your provider. You may need to reschedule your appointment if you arrive late (15 or more minutes).  Arriving late affects you and other patients whose appointments are after yours.  Also, if you miss three or more appointments without notifying the office, you may be dismissed from the clinic at the provider's discretion.      For prescription refill requests, have your pharmacy contact our office and allow 72 hours for refills to be completed.    Today you received the following chemotherapy and/or immunotherapy agents udenyca.  Pegfilgrastim Injection What is this medication? PEGFILGRASTIM (PEG fil gra stim) lowers the risk of infection in people who are receiving chemotherapy. It works by helping your body make more white blood cells, which protects your body from infection. It may also be used to help people who have been exposed to high doses of radiation. This medicine may be used for other purposes; ask your health care provider or pharmacist if you have questions. COMMON BRAND NAME(S): Fulphila, Fylnetra, Neulasta, Nyvepria, Stimufend, UDENYCA, Ziextenzo What should I tell my care team before I take this medication? They need to know if you have any of these conditions: Kidney disease Latex allergy Ongoing radiation therapy Sickle cell disease Skin reactions to acrylic adhesives (On-Body Injector only) An unusual or allergic reaction to pegfilgrastim, filgrastim, other medications, foods, dyes, or preservatives Pregnant or trying to get pregnant Breast-feeding How  should I use this medication? This medication is for injection under the skin. If you get this medication at home, you will be taught how to prepare and give the pre-filled syringe or how to use the On-body Injector. Refer to the patient Instructions for Use for detailed instructions. Use exactly as directed. Tell your care team immediately if you suspect that the On-body Injector may not have performed as intended or if you suspect the use of the On-body Injector resulted in a missed or partial dose. It is important that you put your used needles and syringes in a special sharps container. Do not put them in a trash can. If you do not have a sharps container, call your pharmacist or care team to get one. Talk to your care team about the use of this medication in children. While this medication may be prescribed for selected conditions, precautions do apply. Overdosage: If you think you have taken too much of this medicine contact a poison control center or emergency room at once. NOTE: This medicine is only for you. Do not share this medicine with others. What if I miss a dose? It is important not to miss your dose. Call your care team if you miss your dose. If you miss a dose due to an On-body Injector failure or leakage, a new dose should be administered as soon as possible using a single prefilled syringe for manual use. What may interact with this medication? Interactions have not been studied. This list may not describe all possible interactions. Give your health care provider a list of all the medicines, herbs, non-prescription drugs, or dietary supplements you use. Also tell them   if you smoke, drink alcohol, or use illegal drugs. Some items may interact with your medicine. What should I watch for while using this medication? Your condition will be monitored carefully while you are receiving this medication. You may need blood work done while you are taking this medication. Talk to your care  team about your risk of cancer. You may be more at risk for certain types of cancer if you take this medication. If you are going to need a MRI, CT scan, or other procedure, tell your care team that you are using this medication (On-Body Injector only). What side effects may I notice from receiving this medication? Side effects that you should report to your care team as soon as possible: Allergic reactions--skin rash, itching, hives, swelling of the face, lips, tongue, or throat Capillary leak syndrome--stomach or muscle pain, unusual weakness or fatigue, feeling faint or lightheaded, decrease in the amount of urine, swelling of the ankles, hands, or feet, trouble breathing High white blood cell level--fever, fatigue, trouble breathing, night sweats, change in vision, weight loss Inflammation of the aorta--fever, fatigue, back, chest, or stomach pain, severe headache Kidney injury (glomerulonephritis)--decrease in the amount of urine, red or dark brown urine, foamy or bubbly urine, swelling of the ankles, hands, or feet Shortness of breath or trouble breathing Spleen injury--pain in upper left stomach or shoulder Unusual bruising or bleeding Side effects that usually do not require medical attention (report to your care team if they continue or are bothersome): Bone pain Pain in the hands or feet This list may not describe all possible side effects. Call your doctor for medical advice about side effects. You may report side effects to FDA at 1-800-FDA-1088. Where should I keep my medication? Keep out of the reach of children. If you are using this medication at home, you will be instructed on how to store it. Throw away any unused medication after the expiration date on the label. NOTE: This sheet is a summary. It may not cover all possible information. If you have questions about this medicine, talk to your doctor, pharmacist, or health care provider.  2023 Elsevier/Gold Standard (2021-05-17  00:00:00)       To help prevent nausea and vomiting after your treatment, we encourage you to take your nausea medication as directed.  BELOW ARE SYMPTOMS THAT SHOULD BE REPORTED IMMEDIATELY: *FEVER GREATER THAN 100.4 F (38 C) OR HIGHER *CHILLS OR SWEATING *NAUSEA AND VOMITING THAT IS NOT CONTROLLED WITH YOUR NAUSEA MEDICATION *UNUSUAL SHORTNESS OF BREATH *UNUSUAL BRUISING OR BLEEDING *URINARY PROBLEMS (pain or burning when urinating, or frequent urination) *BOWEL PROBLEMS (unusual diarrhea, constipation, pain near the anus) TENDERNESS IN MOUTH AND THROAT WITH OR WITHOUT PRESENCE OF ULCERS (sore throat, sores in mouth, or a toothache) UNUSUAL RASH, SWELLING OR PAIN  UNUSUAL VAGINAL DISCHARGE OR ITCHING   Items with * indicate a potential emergency and should be followed up as soon as possible or go to the Emergency Department if any problems should occur.  Please show the CHEMOTHERAPY ALERT CARD or IMMUNOTHERAPY ALERT CARD at check-in to the Emergency Department and triage nurse.  Should you have questions after your visit or need to cancel or reschedule your appointment, please contact [facility] [phone]  and follow the prompts.  Office hours are 8:00 a.m. to 4:30 p.m. Monday - Friday. Please note that voicemails left after 4:00 p.m. may not be returned until the following business day.  We are closed weekends and major holidays. You have   access to a nurse at all times for urgent questions. Please call the main number to the clinic [phone] and follow the prompts.  For any non-urgent questions, you may also contact your provider using MyChart. We now offer e-Visits for anyone 18 and older to request care online for non-urgent symptoms. For details visit [email or web address].   Also download the MyChart app! Go to the app store, search "MyChart", open the app, select [facility], and log in with your MyChart username and password.  Masks are optional in the cancer  centers. If you would like for your care team to wear a mask while they are taking care of you, please let them know. For doctor visits, patients may have with them one support person who is at least 76 years old. At this time, visitors are not allowed in the infusion area.  

## 2022-01-14 NOTE — Patient Instructions (Addendum)
Visit Information  Following are the goals we discussed today:  Current Barriers:  Unable to independently afford treatment regimen Unable to achieve control of T2DM, HLD  Suboptimal therapeutic regimen for T2DM, HLD  Pharmacist Clinical Goal(s):  patient will verbalize ability to afford treatment regimen maintain control of T2DM, HLD as evidenced by GOAL A1C, IMPROVED GLYCEMIC CONTROL  through collaboration with PharmD and provider.   Interventions: 1:1 collaboration with [name], MD regarding development and update of comprehensive plan of care as evidenced by provider attestation and co-signature Inter-disciplinary care team collaboration (see longitudinal plan of care) Comprehensive medication review performed; medication list updated in electronic medical record  Diabetes: Goal on Track (progressing): YES. Uncontrolled-A1C 7.9%,  CURRENTLY UNDERGOING CHEMOTHERAPY FOR PROSTATE CANCER Current treatment: METFORMIN, QTERN  CONTINUE QTERN --> patient received shipment and is now on QTERN with better glycemic control Patient approved for az&me patient assistance DISCONTINUED metformin GFR >60 -->37 Detailed instructions given to patient  New BG meter given--Contour Next ONE at last visit Education and set up provided Sent RX via parachute to [facility] DME for patient to get free glucometer at last visit Current glucose readings: fasting glucose: <180, post prandial glucose: 200s Denies hypoglycemic/hyperglycemic symptoms Discussed meal planning options and Plate method for healthy eating Avoid sugary drinks and desserts Incorporate balanced protein, non starchy veggies, 1 serving of carbohydrate with each meal Increase water intake Increase physical activity as able Current exercise: UNABLE DUE TO CHEMO Educated on new medication regimen Assessed patient finances. Enrolled in az&me patient assistance for QTERN  Hypertension -goal <130/80 -patient at goal--continue  current regimen -true [place] metric documented  Patient Goals/Self-Care Activities patient will:  - take medications as prescribed as evidenced by patient report and record review check glucose DAILY OR IF TO, document, and provide at future appointments collaborate with provider on medication access solutions target a minimum of 150 minutes of moderate intensity exercise weekly engage in dietary modifications by FOLLOWING A HEART HEALTHY DIET/HEALTHY PLATE METHOD    Plan: Telephone follow up appointment with care management team member scheduled for:  6 months  Signature Regina Eck, PharmD, BCPS Clinical Pharmacist, [place]  II Phone [phone]   Please call the care guide team at [phone] if you need to cancel or reschedule your appointment.   The patient verbalized understanding of instructions, educational materials, and care plan provided today and DECLINED offer to receive copy of patient instructions, educational materials, and care plan.

## 2022-01-14 NOTE — Progress Notes (Signed)
Chronic Care Management Pharmacy Note  01/10/2022 Name:  Andrew [name] MRN:  [id number] DOB:  1945/09/19  Summary:  Diabetes: Goal on Track (progressing): YES. Uncontrolled-A1C 7.9%,  CURRENTLY UNDERGOING CHEMOTHERAPY FOR PROSTATE CANCER Current treatment: METFORMIN, QTERN  CONTINUE QTERN --> patient received shipment and is now on QTERN with better glycemic control Patient approved for az&me patient assistance DISCONTINUED metformin GFR >60 -->37 Detailed instructions given to patient  New BG meter given--Contour Next ONE at last visit Education and set up provided Sent RX via parachute to [facility] DME for patient to get free glucometer at last visit Current glucose readings: fasting glucose: <180, post prandial glucose: 200s Denies hypoglycemic/hyperglycemic symptoms Discussed meal planning options and Plate method for healthy eating Avoid sugary drinks and desserts Incorporate balanced protein, non starchy veggies, 1 serving of carbohydrate with each meal Increase water intake Increase physical activity as able Current exercise: UNABLE DUE TO CHEMO Educated on new medication regimen Assessed patient finances. Enrolled in az&me patient assistance for QTERN  Hypertension -goal <130/80 -patient at goal--continue current regimen -true [place] metric documented  Patient Goals/Self-Care Activities patient will:  - take medications as prescribed as evidenced by patient report and record review check glucose DAILY OR IF TO, document, and provide at future appointments collaborate with provider on medication access solutions target a minimum of 150 minutes of moderate intensity exercise weekly engage in dietary modifications by FOLLOWING A HEART HEALTHY DIET/HEALTHY PLATE METHOD  Subjective: [name] is an 76 y.o. year old male who is a primary patient of [name], Andrew Gash, MD.  The CCM team was consulted for assistance with disease management and care coordination needs.     Engaged with patient by telephone for follow up visit in response to provider referral for pharmacy case management and/or care coordination services.   Consent to Services:  The patient was given information about Chronic Care Management services, agreed to services, and gave verbal consent prior to initiation of services.  Please see initial visit note for detailed documentation.   Patient Care Team: [name], MD as PCP - General (Family Medicine) Minus Breeding, MD as Consulting Physician (Cardiology) Alyson Ingles Candee Furbish, MD as Consulting Physician (Urology) Ilean China, RN as Case Manager Mikhayla Phillis, Royce Macadamia, [facility] (Pharmacist) Steffanie Rainwater, DPM as Consulting Physician (Podiatry) Hilarie Fredrickson, Lajuan Lines, MD as Consulting Physician (Gastroenterology) Harlen Labs, MD as Referring Physician (Optometry) Derek Jack, MD as Medical Oncologist (Medical Oncology)  Objective:  Lab Results  Component Value Date   CREATININE 1.52 (H) 01/08/2022   CREATININE 1.29 (H) 12/23/2021   CREATININE 1.28 (H) 12/09/2021    Lab Results  Component Value Date   HGBA1C 7.9 (H) 10/02/2021   Last diabetic Eye exam:  Lab Results  Component Value Date/Time   HMDIABEYEEXA No Retinopathy 08/28/2017 12:00 AM    Last diabetic Foot exam: No results found for: "HMDIABFOOTEX"      Component Value Date/Time   CHOL 129 10/02/2021 1500   CHOL 144 11/18/2012 0844   TRIG 251 (H) 10/02/2021 1500   TRIG 142 01/03/2016 0800   TRIG 161 (H) 11/18/2012 0844   HDL 32 (L) 10/02/2021 1500   HDL 31 (L) 01/03/2016 0800   HDL 27 (L) 11/18/2012 0844   CHOLHDL 4.0 10/02/2021 1500   LDLCALC 57 10/02/2021 1500   LDLCALC 45 03/08/2014 0810   [place] 85 11/18/2012 0844       Latest Ref Rng & Units 01/08/2022    8:18 AM  12/23/2021    8:30 AM 12/09/2021    8:13 AM  Hepatic Function  Total Protein 6.5 - 8.1 g/dL 6.5  7.0  7.1   Albumin 3.5 - 5.0 g/dL 3.1  3.1  2.8   AST 15 - 41 U/L _0 ALT 0 -  44 U/L _1 Alk Phosphatase 38 - 126 U/L 55  64  75   Total Bilirubin 0.3 - 1.2 mg/dL 0.5  0.8  0.8     Lab Results  Component Value Date/Time   TSH 1.540 04/10/2020 10:25 AM   TSH 1.950 12/29/2018 08:07 AM       Latest Ref Rng & Units 01/08/2022    8:18 AM 12/23/2021    8:30 AM 12/09/2021    8:13 AM  CBC  WBC 4.0 - 10.5 K/uL 6.7  8.0  8.6   Hemoglobin 13.0 - 17.0 g/dL 9.9  10.0  9.1   Hematocrit 39.0 - 52.0 % 30.0  30.6  27.9   Platelets 150 - 400 K/uL 164  257  360     Lab Results  Component Value Date/Time   VD25OH 33.9 07/26/2020 11:12 AM   VD25OH 31.1 04/10/2020 10:25 AM    Clinical ASCVD: No  The ASCVD Risk score (Arnett DK, et al., 2019) failed to calculate for the following reasons:   The valid total cholesterol range is 130 to 320 mg/dL    Other: (CHADS2VASc if Afib, PHQ9 if depression, MMRC or CAT for COPD, ACT, DEXA)  Social History   Tobacco Use  Smoking Status Every Day   Packs/day: 1.00   Years: 55.00   Total pack years: 55.00   Types: Cigarettes   Start date: 06/30/1965  Smokeless Tobacco Never  Tobacco Comments   Has tried nicotine replacement gum   BP Readings from Last 3 Encounters:  01/10/22 130/73  01/08/22 138/62  01/08/22 (!) 111/58   Pulse Readings from Last 3 Encounters:  01/10/22 88  01/08/22 68  01/08/22 80   Wt Readings from Last 3 Encounters:  01/08/22 176 lb 6.4 oz (80 kg)  12/23/21 174 lb (78.9 kg)  12/09/21 176 lb 8 oz (80.1 kg)    Assessment: Review of patient past medical history, allergies, medications, health status, including review of consultants reports, laboratory and other test data, was performed as part of comprehensive evaluation and provision of chronic care management services.   SDOH:  (Social Determinants of Health) assessments and interventions performed:    CCM Care Plan  No Known Allergies  Medications Reviewed Today     Reviewed by Lavera Guise, [facility] (Pharmacist) on 01/14/22 at 1348  Med  List Status: <None>   Medication Order Taking? Sig Documenting Provider Last Dose Status Informant  alfuzosin (UROXATRAL) 10 MG 24 hr tablet [id number] No Take 1 tablet (10 mg total) by mouth at bedtime. McKenzie, Candee Furbish, MD Taking Active Self, Spouse/Significant Other  ammonium lactate (AMLACTIN) 12 % cream [id number] No APPLY TWICE DAILY TO DRY SKIN ON FEET [provider] Taking Active   aspirin 81 MG tablet [id number] No Take 1 tablet (81 mg total) by mouth daily with breakfast. Pappayliou, Flint Melter, DO Taking Active   atorvastatin (LIPITOR) 40 MG tablet [id number] No Take 1 tablet (40 mg total) by mouth daily.  Patient taking differently: Take 40 mg by mouth at bedtime.   [name], MD Taking Active Self, Spouse/Significant Other  azelastine (ASTELIN) 0.1 % nasal  spray [id number] No Place 1 spray into both nostrils 2 (two) times daily as needed for allergies. [provider] Taking Active Self, Spouse/Significant Other  BOOSTRIX 5-2.5-18.5 LF-MCG/0.5 injection [id number] No  [provider] Taking Active   cholecalciferol (VITAMIN D) 1000 UNITS tablet [id number] No Take 1,000 Units by mouth daily.  [provider] Taking Active Self, Spouse/Significant Other  Dapagliflozin-sAXagliptin (QTERN) 10-5 MG TABS [id number] No Take 1 tablet by mouth in the morning. [name], MD Taking Active            Med Note Parthenia Ames Dec 05, 2021  9:25 AM) Via AZ&me patient assistance program   DOCEtaxel (TAXOTERE IV) [id number] No Inject into the vein every 21 ( twenty-one) days. [provider] Taking Active Self, Spouse/Significant Other  docusate sodium (COLACE) 100 MG capsule [id number] No Take 1 capsule (100 mg total) by mouth 2 (two) times daily. Pappayliou, Flint Melter, DO Taking Active   dronabinol (MARINOL) 5 MG capsule [id number] No Take 1 capsule (5 mg total) by mouth 2 (two) times daily before a meal. Derek Jack, MD Taking Active    escitalopram (LEXAPRO) 10 MG tablet [id number] No Take 1 tablet (10 mg total) by mouth daily. [name], MD Taking Active   FARXIGA 10 MG TABS tablet [id number] No Take 10 mg by mouth daily. [provider] Taking Active            Med Note Blanca Friend, Sherian Maroon Dec 05, 2021  9:51 AM) samples  glimepiride (AMARYL) 2 MG tablet [id number] No Take 2 mg by mouth 2 (two) times daily. [provider] Taking Active   glucose blood test strip [id number] No 1 each by Other route as needed for other. Use as instructed to test blood sugar daily.  One Touch Verio Test Strips DX: E11.65 [name], MD Taking Active Self, Spouse/Significant Other           Med Note Blanca Friend, Royce Macadamia   Mon Oct 17, 2019  8:01 AM) One Touch Verio  lidocaine-prilocaine (EMLA) cream [id number] No Apply a small amount to port a cath site and cover with plastic wrap 1 hour prior to chemotherapy appointments  Patient not taking: Reported on 01/08/2022   Derek Jack, MD Not Taking Active Self, Spouse/Significant Other  lisinopril-hydrochlorothiazide (ZESTORETIC) 20-25 MG tablet [id number] No Take 1 tablet by mouth daily. [name], MD Taking Active Self, Spouse/Significant Other  magnesium oxide (MAG-OX) 400 (240 Mg) MG tablet [id number] No Take 1 tablet (400 mg total) by mouth 2 (two) times daily. Derek Jack, MD Taking Active   megestrol (MEGACE) 400 MG/10ML suspension [id number] No Take 10 mLs (400 mg total) by mouth 2 (two) times daily. Derek Jack, MD Taking Active   metFORMIN (GLUCOPHAGE) 1000 MG tablet [id number] No Take 1 tablet (1,000 mg total) by mouth 2 (two) times daily with a meal. Denton Brick, [place], MD Taking Active Self, Spouse/Significant Other  oxyCODONE (ROXICODONE) 5 MG immediate release tablet [id number] No Take 1 tablet (5 mg total) by mouth every 8 (eight) hours as needed. Pappayliou, Flint Melter, DO Taking Active   potassium chloride SA (KLOR-CON M) 20 MEQ tablet  [id number] No Take 1 tablet (20 mEq total) by mouth daily. Derek Jack, MD Taking Active Self, Spouse/Significant Other  pramoxine (PROCTOFOAM) 1 % foam [id number] No Place 1 application. rectally 3 (three) times daily as needed for anal itching. Derek Jack, MD Taking Active   prochlorperazine (COMPAZINE) 10 MG tablet [id number]  No Take 1 tablet (10 mg total) by mouth every 6 (six) hours as needed (Nausea or vomiting).  Patient not taking: Reported on 01/08/2022   Derek Jack, MD Not Taking Active Self, Spouse/Significant Other            Patient Active Problem List   Diagnosis Date Noted   Iron deficiency anemia 12/02/2021   Port-A-Cath in place 10/09/2021   Hyperglycemia due to diabetes mellitus ([place]) 10/04/2021   Aortic atherosclerosis ([place]) 01/18/2021   Primary insomnia 07/26/2020   Sacroiliitis ([place]) 07/26/2020   Nocturia 02/08/2020   Nonspecific abnormal electrocardiogram (ECG) (EKG) 07/18/2019   Type 2 diabetes mellitus with complication, without long-term current use of insulin ([place]) 07/18/2019   SOB (shortness of breath) 12/10/2016   Erectile dysfunction due to arterial insufficiency 10/30/2016   Polyp of colon 10/30/2016   Peripheral vascular insufficiency ([place]) 06/13/2016   Vitamin D deficiency 08/24/2015   Prostate cancer ([place]) 04/07/2014   Type 2 diabetes mellitus without complications ([place]) 16/94/5038   Benign prostatic hyperplasia with urinary obstruction 09/14/2010   Essential hypertension 09/14/2010   Hyperlipidemia associated with type 2 diabetes mellitus ([place]) 09/14/2010   Tobacco abuse 09/14/2010    Immunization History  Administered Date(s) Administered   Fluad Quad(high Dose 65+) 04/06/2019, 04/10/2020   Influenza, High Dose Seasonal PF 05/13/2016, 04/14/2017, 04/06/2018   Influenza,inj,Quad PF,6+ Mos 04/18/2013, 04/07/2014, 04/06/2015   Moderna SARS-COV2 Booster Vaccination 06/06/2020, 11/21/2020   Moderna Sars-Covid-2  Vaccination 08/22/2019, 09/19/2019   Pneumococcal Conjugate-13 02/27/2014   Pneumococcal Polysaccharide-23 11/16/2012    Conditions to be addressed/monitored: DMII and CKD Stage 3B  Care Plan : PHARMD MEDICATION MANAGEMENT  Updates made by Lavera Guise, [place] since 01/14/2022 12:00 AM     Problem: DISEASE PROGRESSION PREVENTION      Long-Range Goal: T2DM PHARMD GOAL   Recent Progress: Not on track  Priority: High  Note:   Current Barriers:  Unable to independently afford treatment regimen Unable to achieve control of T2DM, HLD  Suboptimal therapeutic regimen for T2DM, HLD  Pharmacist Clinical Goal(s):  patient will verbalize ability to afford treatment regimen maintain control of T2DM, HLD as evidenced by GOAL A1C, IMPROVED GLYCEMIC CONTROL  through collaboration with PharmD and provider.   Interventions: 1:1 collaboration with [name], MD regarding development and update of comprehensive plan of care as evidenced by provider attestation and co-signature Inter-disciplinary care team collaboration (see longitudinal plan of care) Comprehensive medication review performed; medication list updated in electronic medical record  Diabetes: Goal on Track (progressing): YES. Uncontrolled-A1C 7.9%,  CURRENTLY UNDERGOING CHEMOTHERAPY FOR PROSTATE CANCER Current treatment: METFORMIN, QTERN  CONTINUE QTERN --> patient received shipment and is now on QTERN with better glycemic control Patient approved for az&me patient assistance DISCONTINUED metformin GFR >60 -->37 Detailed instructions given to patient  New BG meter given--Contour Next ONE at last visit Education and set up provided Sent RX via parachute to [facility] DME for patient to get free glucometer at last visit Current glucose readings: fasting glucose: <180, post prandial glucose: 200s Denies hypoglycemic/hyperglycemic symptoms Discussed meal planning options and Plate method for healthy eating Avoid sugary  drinks and desserts Incorporate balanced protein, non starchy veggies, 1 serving of carbohydrate with each meal Increase water intake Increase physical activity as able Current exercise: UNABLE DUE TO CHEMO Educated on new medication regimen Assessed patient finances. Enrolled in az&me patient assistance for QTERN  Hypertension -goal <130/80 -patient at goal--continue current regimen -true [place] metric documented  Patient Goals/Self-Care Activities patient  will:  - take medications as prescribed as evidenced by patient report and record review check glucose DAILY OR IF TO, document, and provide at future appointments collaborate with provider on medication access solutions target a minimum of 150 minutes of moderate intensity exercise weekly engage in dietary modifications by   FOLLOWING A HEART HEALTHY DIET/HEALTHY PLATE METHOD       Medication Assistance:  QTERN obtained through AZ&ME medication assistance program.  Enrollment ends 06/29/22  Patient's preferred pharmacy is:  [facility] [address], [place] [address] [place] [address] Phone: [phone] Fax: [phone]  [facility] [place], [place] [address] [place] [address] Phone: [phone] Fax: [phone]   Follow Up:  Patient agrees to Care Plan and Follow-up.  Plan: Telephone follow up appointment with care management team member scheduled for:  04/2022   Regina Eck, PharmD, BCPS Clinical Pharmacist, [place]  II Phone [phone]

## 2022-01-20 ENCOUNTER — Other Ambulatory Visit: Payer: Self-pay | Admitting: Family Medicine

## 2022-01-20 ENCOUNTER — Other Ambulatory Visit: Payer: Self-pay

## 2022-01-20 DIAGNOSIS — C61 Malignant neoplasm of prostate: Secondary | ICD-10-CM

## 2022-01-20 DIAGNOSIS — D509 Iron deficiency anemia, unspecified: Secondary | ICD-10-CM

## 2022-01-20 DIAGNOSIS — E118 Type 2 diabetes mellitus with unspecified complications: Secondary | ICD-10-CM

## 2022-01-20 DIAGNOSIS — Z95828 Presence of other vascular implants and grafts: Secondary | ICD-10-CM

## 2022-01-20 DIAGNOSIS — E1165 Type 2 diabetes mellitus with hyperglycemia: Secondary | ICD-10-CM

## 2022-01-27 ENCOUNTER — Ambulatory Visit (INDEPENDENT_AMBULATORY_CARE_PROVIDER_SITE_OTHER): Payer: Medicare HMO

## 2022-01-27 VITALS — Wt 176.0 lb

## 2022-01-27 DIAGNOSIS — E119 Type 2 diabetes mellitus without complications: Secondary | ICD-10-CM | POA: Diagnosis not present

## 2022-01-27 DIAGNOSIS — E785 Hyperlipidemia, unspecified: Secondary | ICD-10-CM | POA: Diagnosis not present

## 2022-01-27 DIAGNOSIS — E1169 Type 2 diabetes mellitus with other specified complication: Secondary | ICD-10-CM | POA: Diagnosis not present

## 2022-01-27 DIAGNOSIS — Z0000 Encounter for general adult medical examination without abnormal findings: Secondary | ICD-10-CM

## 2022-01-27 NOTE — Progress Notes (Signed)
Virtual Visit via Telephone Note  I connected with  Andrew [name] on 01/27/22 at  9:45 AM EDT by telephone and verified that I am speaking with the correct person using two identifiers.  Location: Patient: home Provider: [facility] Persons participating in the virtual visit: patient/Nurse Health Advisor   I discussed the limitations, risks, security and privacy concerns of performing an evaluation and management service by telephone and the availability of in person appointments. The patient expressed understanding and agreed to proceed.  Interactive audio and video telecommunications were attempted between this nurse and patient, however failed, due to patient having technical difficulties OR patient did not have access to video capability.  We continued and completed visit with audio only.  Some vital signs may be absent or patient reported.   Andrew David, LPN  Subjective:   Andrew [name] is a 76 y.o. male who presents for Medicare Annual/Subsequent preventive examination.  Review of Systems           Objective:    There were no vitals filed for this visit. There is no height or weight on file to calculate BMI.     01/10/2022    8:40 AM 01/08/2022    9:33 AM 12/23/2021   10:09 AM 12/23/2021    8:40 AM 12/09/2021    8:23 AM 12/02/2021    9:09 AM 11/13/2021   10:35 AM  Advanced Directives  Does Patient Have a Medical Advance Directive? _0  No No  Would patient like information on creating a medical advance directive? No - Patient declined No - Patient declined No - Patient declined No - Patient declined No - Patient declined No - Patient declined No - Patient declined    Current Medications (verified) Outpatient Encounter Medications as of 01/27/2022  Medication Sig   alfuzosin (UROXATRAL) 10 MG 24 hr tablet Take 1 tablet (10 mg total) by mouth at bedtime.   aspirin 81 MG tablet Take 1 tablet (81 mg total) by mouth daily with breakfast.   atorvastatin (LIPITOR) 40 MG  tablet Take 1 tablet (40 mg total) by mouth daily. (Patient taking differently: Take 40 mg by mouth at bedtime.)   azelastine (ASTELIN) 0.1 % nasal spray Place 1 spray into both nostrils 2 (two) times daily as needed for allergies.   BOOSTRIX 5-2.5-18.5 LF-MCG/0.5 injection    cholecalciferol (VITAMIN D) 1000 UNITS tablet Take 1,000 Units by mouth daily.    Dapagliflozin-sAXagliptin (QTERN) 10-5 MG TABS Take 1 tablet by mouth in the morning.   DOCEtaxel (TAXOTERE IV) Inject into the vein every 21 ( twenty-one) days.   docusate sodium (COLACE) 100 MG capsule Take 1 capsule (100 mg total) by mouth 2 (two) times daily.   dronabinol (MARINOL) 5 MG capsule Take 1 capsule (5 mg total) by mouth 2 (two) times daily before a meal.   escitalopram (LEXAPRO) 10 MG tablet Take 1 tablet (10 mg total) by mouth daily.   FARXIGA 10 MG TABS tablet Take 10 mg by mouth daily.   glimepiride (AMARYL) 2 MG tablet Take 2 mg by mouth 2 (two) times daily.   glucose blood test strip 1 each by Other route as needed for other. Use as instructed to test blood sugar daily.  One Touch Verio Test Strips DX: E11.65   lidocaine-prilocaine (EMLA) cream Apply a small amount to port a cath site and cover with plastic wrap 1 hour prior to chemotherapy appointments   lisinopril-hydrochlorothiazide (ZESTORETIC) 20-25 MG tablet Take 1  tablet by mouth daily.   magnesium oxide (MAG-OX) 400 (240 Mg) MG tablet Take 1 tablet (400 mg total) by mouth 2 (two) times daily.   megestrol (MEGACE) 400 MG/10ML suspension Take 10 mLs (400 mg total) by mouth 2 (two) times daily.   metFORMIN (GLUCOPHAGE) 1000 MG tablet Take 1 tablet (1,000 mg total) by mouth 2 (two) times daily with a meal.   oxyCODONE (ROXICODONE) 5 MG immediate release tablet Take 1 tablet (5 mg total) by mouth every 8 (eight) hours as needed.   potassium chloride SA (KLOR-CON M) 20 MEQ tablet Take 1 tablet (20 mEq total) by mouth daily.   pramoxine (PROCTOFOAM) 1 % foam Place 1  application. rectally 3 (three) times daily as needed for anal itching.   prochlorperazine (COMPAZINE) 10 MG tablet Take 1 tablet (10 mg total) by mouth every 6 (six) hours as needed (Nausea or vomiting).   ammonium lactate (AMLACTIN) 12 % cream APPLY TWICE DAILY TO DRY SKIN ON FEET (Patient not taking: Reported on 01/27/2022)   Facility-Administered Encounter Medications as of 01/27/2022  Medication   0.9 %  sodium chloride infusion   sodium chloride flush (NS) 0.9 % injection 10 mL    Allergies (verified) Patient has no known allergies.   History: Past Medical History:  Diagnosis Date   AKI (acute kidney injury) ([place]) 10/04/2021   Anemia    BPH (benign prostatic hyperplasia)    Sees Dr [name]   Cataract    Diabetes mellitus without complication [facility])    Educated about COVID-19 virus infection 07/18/2019   Genetic testing 05/09/2021   [facility] Multi-Cancer Panel was Negative. Report date is 05/07/2021.  The Multi-Cancer + RNA Panel offered by [facility] includes sequencing and/or deletion/duplication analysis of the following 84 genes:  AIP*, ALK, APC*, ATM*, AXIN2*, BAP1*, BARD1*, BLM*, BMPR1A*, BRCA1*, BRCA2*, BRIP1*, CASR, CDC73*, CDH1*, CDK4, CDKN1B*, CDKN1C*, CDKN2A, CEBPA, CHEK2*, CTNNA1*, DICER1*, DIS3L2*, EGFR, EPCAM, FH*, F   Hyperlipidemia    Hypertension    Low serum vitamin D    Port-A-Cath in place 10/09/2021   Prostate cancer ([place]) 2008   w/seed implantation and radiation   Past Surgical History:  Procedure Laterality Date   COLONOSCOPY  2015,2018   PORTACATH PLACEMENT Right 10/17/2021   Procedure: INSERTION PORT-A-CATH, [name];  Surgeon: [name], DO;  Location: [name];  Service: General;  Laterality: Right;  pt needs to have glucose checked AM of surgery   PROSTATE SURGERY  2008   seed implant   Family History  Problem Relation Age of Onset   Diabetes Mother    Heart disease Mother    Kidney disease Mother        DIALYSIS   Emphysema Father    Deep  vein thrombosis Sister    Kidney disease Sister    Lung cancer Sister    Colon polyps Brother    Hypertension Brother    Gout Brother    Colon cancer Neg Hx    Esophageal cancer Neg Hx    Rectal cancer Neg Hx    Prostate cancer Neg Hx    Social History   Socioeconomic History   Marital status: Married    Spouse name: Souris   Number of children: 2   Years of education: 12   Highest education level: 12th grade  Occupational History   Occupation: [name]    Comment: Retired Geologist, engineering  Tobacco Use   Smoking status: Every Day    Packs/day: 1.00  Years: 55.00    Total pack years: 55.00    Types: Cigarettes    Start date: 06/30/1965   Smokeless tobacco: Never   Tobacco comments:    Has tried nicotine replacement gum  Vaping Use   Vaping Use: Never used  Substance and Sexual Activity   Alcohol use: No   Drug use: No   Sexual activity: Yes  Other Topics Concern   Not on file  Social History Narrative   Retired, lives at home with wife [name], two grown children, enjoys gardening     Social Determinants of Health   Financial Resource Strain: Low Risk  (01/24/2021)   Overall Financial Resource Strain (CARDIA)    Difficulty of Paying Living Expenses: Not hard at all  Food Insecurity: No Food Insecurity (01/24/2021)   Hunger Vital Sign    Worried About Running Out of Food in the Last Year: Never true    [place] in the Last Year: Never true  Transportation Needs: No Transportation Needs (01/24/2021)   PRAPARE - Hydrologist (Medical): No    Lack of Transportation (Non-Medical): No  Physical Activity: Inactive (01/27/2022)   Exercise Vital Sign    Days of Exercise per Week: 0 days    Minutes of Exercise per Session: 0 min  Stress: No Stress Concern Present (01/27/2022)   [place]    Feeling of Stress : Not at all  Social Connections: [place]  (01/24/2021)   Social Connection and Isolation Panel [NHANES]    Frequency of Communication with Friends and Family: More than three times a week    Frequency of Social Gatherings with Friends and Family: More than three times a week    Attends Religious Services: More than 4 times per year    Active Member of [employer] or Organizations: Yes    Attends Music therapist: More than 4 times per year    Marital Status: Married    Tobacco Counseling Ready to quit: Not Answered Counseling given: Not Answered Tobacco comments: Has tried nicotine replacement gum   Clinical Intake:  Pre-visit preparation completed: Yes  Pain : No/denies pain     Nutritional Risks: None Diabetes: Yes CBG done?: No Did pt. bring in CBG monitor from home?: No  How often do you need to have someone help you when you read instructions, pamphlets, or other written materials from your doctor or pharmacy?: 1 - Never  Diabetic?Nutrition Risk Assessment:  Has the patient had any N/V/D within the last 2 months?  Yes  Does the patient have any non-healing wounds?  No  Has the patient had any unintentional weight loss or weight gain?  No   Diabetes:  Is the patient diabetic?  Yes  If diabetic, was a CBG obtained today?  No  Did the patient bring in their glucometer from home?  No  How often do you monitor your CBG's? 3X/WEEK.   Financial Strains and Diabetes Management:  Are you having any financial strains with the device, your supplies or your medication? No .  Does the patient want to be seen by Chronic Care Management for management of their diabetes?  No  Would the patient like to be referred to a Nutritionist or for Diabetic Management?  No   Diabetic Exams:  Diabetic Eye Exam: Completed 05/31/19. Overdue for diabetic eye exam. Pt has been advised about the importance in completing this exam.  Diabetic  Foot Exam: Completed 10/24/20. Pt has been advised about the importance in completing  this exam.   Interpreter Needed?: No  Information entered by :: Kirke Shaggy, LPN   Activities of Daily Living    10/15/2021   10:17 AM 10/15/2021   10:15 AM  In your present state of health, do you have any difficulty performing the following activities:  Hearing?  0  Vision?  0  Difficulty concentrating or making decisions?  0  Walking or climbing stairs?  0  Dressing or bathing?  0  Doing errands, shopping? 0     Patient Care Team: [name], MD as PCP - General (Family Medicine) Minus Breeding, MD as Consulting Physician (Cardiology) McKenzie, Candee Furbish, MD as Consulting Physician (Urology) Ilean China, RN as Case Manager Pruitt, Royce Macadamia, [facility] (Pharmacist) Steffanie Rainwater, DPM as Consulting Physician (Podiatry) Pyrtle, Lajuan Lines, MD as Consulting Physician (Gastroenterology) Harlen Labs, MD as Referring Physician (Optometry) Derek Jack, MD as Medical Oncologist (Medical Oncology)  Indicate any recent Medical Services you may have received from other than [facility] providers in the past year (date may be approximate).     Assessment:   This is a routine wellness examination for [place].  Hearing/Vision screen No results found.  Dietary issues and exercise activities discussed:     Goals Addressed             This Visit's Progress    DIET - EAT MORE FRUITS AND VEGETABLES         Depression Screen    01/27/2022    9:54 AM 12/03/2021    9:17 AM 12/03/2021    8:54 AM 10/29/2021   10:18 AM 10/29/2021   10:11 AM 10/02/2021    2:56 PM 05/01/2021   10:35 AM  PHQ 2/9 Scores  PHQ - 2 Score 1 3 0 3 0 0 1  PHQ- 9 Score _0 Fall Risk    12/03/2021    8:54 AM 10/29/2021   10:11 AM 10/02/2021    2:56 PM 05/01/2021    9:58 AM 01/29/2021    9:48 AM  Fall Risk   Falls in the past year? 0 0 0 0 0    FALL RISK PREVENTION PERTAINING TO THE HOME:  Any stairs in or around the home? No  If so, are there any without handrails? No  Home free of loose throw  rugs in walkways, pet beds, electrical cords, etc? Yes  Adequate lighting in your home to reduce risk of falls? Yes   ASSISTIVE DEVICES UTILIZED TO PREVENT FALLS:  Life alert? No  Use of a cane, walker or w/c? Yes  Grab bars in the bathroom? No  Shower chair or bench in shower? No  Elevated toilet seat or a handicapped toilet? No    Cognitive Function:UNABLE TO COMPLETE    01/11/2018   10:41 AM 01/05/2017    2:15 PM  MMSE - Mini Mental State Exam  Orientation to time 5 4  Orientation to Place 4 5  Registration 3 3  Attention/ Calculation 5 3  Recall 3 3  Language- name 2 objects 2 2  Language- repeat 1 1  Language- follow 3 step command 3 3  Language- read & follow direction 1 1  Write a sentence 1 1  Copy design 1 1  Total score 29 27        01/24/2021    9:57 AM 01/24/2020  10:44 AM 01/20/2019   11:10 AM  6CIT Screen  What Year? 0 points 0 points 0 points  What month? 0 points 0 points 0 points  What time? 0 points 0 points 0 points  Count back from 20 0 points 0 points 0 points  Months in reverse 0 points 0 points 0 points  Repeat phrase 0 points 2 points 2 points  Total Score 0 points 2 points 2 points    Immunizations Immunization History  Administered Date(s) Administered   Fluad Quad(high Dose 65+) 04/06/2019, 04/10/2020   Influenza, High Dose Seasonal PF 05/13/2016, 04/14/2017, 04/06/2018   Influenza,inj,Quad PF,6+ Mos 04/18/2013, 04/07/2014, 04/06/2015   Moderna SARS-COV2 Booster Vaccination 06/06/2020, 11/21/2020   Moderna Sars-Covid-2 Vaccination 08/22/2019, 09/19/2019   Pneumococcal Conjugate-13 02/27/2014   Pneumococcal Polysaccharide-23 11/16/2012    TDAP status: Due, Education has been provided regarding the importance of this vaccine. Advised may receive this vaccine at local pharmacy or Health Dept. Aware to provide a copy of the vaccination record if obtained from local pharmacy or Health Dept. Verbalized acceptance and understanding.  Flu  Vaccine status: Declined, Education has been provided regarding the importance of this vaccine but patient still declined. Advised may receive this vaccine at local pharmacy or Health Dept. Aware to provide a copy of the vaccination record if obtained from local pharmacy or Health Dept. Verbalized acceptance and understanding.  Pneumococcal vaccine status: Up to date  Covid-19 vaccine status: Completed vaccines  Qualifies for Shingles Vaccine? Yes   Zostavax completed No   Shingrix Completed?: No.    Education has been provided regarding the importance of this vaccine. Patient has been advised to call insurance company to determine out of pocket expense if they have not yet received this vaccine. Advised may also receive vaccine at local pharmacy or Health Dept. Verbalized acceptance and understanding.  Screening Tests Health Maintenance  Topic Date Due   OPHTHALMOLOGY EXAM  05/30/2020   COVID-19 Vaccine (3 - Moderna risk series) 12/19/2020   FOOT EXAM  10/24/2021   Hepatitis C Screening  01/29/2022 (Originally 10/27/1963)   Zoster Vaccines- Shingrix (1 of 2) 01/29/2022 (Originally 10/26/1964)   TETANUS/TDAP  10/30/2022 (Originally 10/26/1964)   INFLUENZA VACCINE  01/28/2022   HEMOGLOBIN A1C  04/03/2022   COLONOSCOPY (Pts 45-25yrs Insurance coverage will need to be confirmed)  11/01/2022   Pneumonia Vaccine 1+ Years old  Completed   HPV VACCINES  Aged Out    Health Maintenance  Health Maintenance Due  Topic Date Due   OPHTHALMOLOGY EXAM  05/30/2020   COVID-19 Vaccine (3 - Moderna risk series) 12/19/2020   FOOT EXAM  10/24/2021    Colorectal cancer screening: Type of screening: Colonoscopy. Completed 11/01/19. Repeat every 3 years  Lung Cancer Screening: (Low Dose CT Chest recommended if Age 62-80 years, 30 pack-year currently smoking OR have quit w/in 15years.) does qualify.   Lung Cancer Screening Referral: DECLINED REFERRAL  Additional Screening:  Hepatitis C Screening: does  qualify; Completed NO  Vision Screening: Recommended annual ophthalmology exams for early detection of glaucoma and other disorders of the eye. Is the patient up to date with their annual eye exam?  Yes  Who is the provider or what is the name of the office in which the patient attends annual eye exams? [facility] If pt is not established with a provider, would they like to be referred to a provider to establish care? No .   Dental Screening: Recommended annual dental exams for proper oral hygiene  Community Resource Referral / Chronic Care Management: CRR required this visit?  No   CCM required this visit?  No      Plan:     I have personally reviewed and noted the following in the patient's chart:   Medical and social history Use of alcohol, tobacco or illicit drugs  Current medications and supplements including opioid prescriptions. Patient is currently taking opioid prescriptions. Information provided to patient regarding non-opioid alternatives. Patient advised to discuss non-opioid treatment plan with their provider. Functional ability and status Nutritional status Physical activity Advanced directives List of other physicians Hospitalizations, surgeries, and ER visits in previous 12 months Vitals Screenings to include cognitive, depression, and falls Referrals and appointments  In addition, I have reviewed and discussed with patient certain preventive protocols, quality metrics, and best practice recommendations. A written personalized care plan for preventive services as well as general preventive health recommendations were provided to patient.     Andrew David, LPN   [id number]   Nurse Notes: Marlynn Perking

## 2022-01-27 NOTE — Patient Instructions (Signed)
[name] , Thank you for taking time to come for your Medicare Wellness Visit. I appreciate your ongoing commitment to your health goals. Please review the following plan we discussed and let me know if I can assist you in the future.   Screening recommendations/referrals: Colonoscopy: 11/01/19 Recommended yearly ophthalmology/optometry visit for glaucoma screening and checkup Recommended yearly dental visit for hygiene and checkup  Vaccinations: Influenza vaccine: N/D Pneumococcal vaccine: 02/27/14 Tdap vaccine: N/D Shingles vaccine: N/D   Covid-19: 08/22/19, 08/30/19, 06/06/20, 11/21/20  Advanced directives: NO  Conditions/risks identified: NONE  Next appointment: Follow up in one year for your annual wellness visit.   Preventive Care 76 Years and Older, Male Preventive care refers to lifestyle choices and visits with your health care provider that can promote health and wellness. What does preventive care include? A yearly physical exam. This is also called an annual well check. Dental exams once or twice a year. Routine eye exams. Ask your health care provider how often you should have your eyes checked. Personal lifestyle choices, including: Daily care of your teeth and gums. Regular physical activity. Eating a healthy diet. Avoiding tobacco and drug use. Limiting alcohol use. Practicing safe sex. Taking low doses of aspirin every day. Taking vitamin and mineral supplements as recommended by your health care provider. What happens during an annual well check? The services and screenings done by your health care provider during your annual well check will depend on your age, overall health, lifestyle risk factors, and family history of disease. Counseling  Your health care provider may ask you questions about your: Alcohol use. Tobacco use. Drug use. Emotional well-being. Home and relationship well-being. Sexual activity. Eating habits. History of falls. Memory and  ability to understand (cognition). Work and work Statistician. Screening  You may have the following tests or measurements: Height, weight, and BMI. Blood pressure. Lipid and cholesterol levels. These may be checked every 5 years, or more frequently if you are over 67 years old. Skin check. Lung cancer screening. You may have this screening every year starting at age 80 if you have a 30-pack-year history of smoking and currently smoke or have quit within the past 15 years. Fecal occult blood test (FOBT) of the stool. You may have this test every year starting at age 61. Flexible sigmoidoscopy or colonoscopy. You may have a sigmoidoscopy every 5 years or a colonoscopy every 10 years starting at age 21. Prostate cancer screening. Recommendations will vary depending on your family history and other risks. Hepatitis C blood test. Hepatitis B blood test. Sexually transmitted disease (STD) testing. Diabetes screening. This is done by checking your blood sugar (glucose) after you have not eaten for a while (fasting). You may have this done every 1-3 years. Abdominal aortic aneurysm (AAA) screening. You may need this if you are a current or former smoker. Osteoporosis. You may be screened starting at age 24 if you are at high risk. Talk with your health care provider about your test results, treatment options, and if necessary, the need for more tests. Vaccines  Your health care provider may recommend certain vaccines, such as: Influenza vaccine. This is recommended every year. Tetanus, diphtheria, and acellular pertussis (Tdap, Td) vaccine. You may need a Td booster every 10 years. Zoster vaccine. You may need this after age 88. Pneumococcal 13-valent conjugate (PCV13) vaccine. One dose is recommended after age 50. Pneumococcal polysaccharide (PPSV23) vaccine. One dose is recommended after age 75. Talk to your health care provider about which screenings  and vaccines you need and how often you need  them. This information is not intended to replace advice given to you by your health care provider. Make sure you discuss any questions you have with your health care provider. Document Released: 07/13/2015 Document Revised: 03/05/2016 Document Reviewed: 04/17/2015 Elsevier Interactive Patient Education  2017 [employer] Prevention in the Home Falls can cause injuries. They can happen to people of all ages. There are many things you can do to make your home safe and to help prevent falls. What can I do on the outside of my home? Regularly fix the edges of walkways and driveways and fix any cracks. Remove anything that might make you trip as you walk through a door, such as a raised step or threshold. Trim any bushes or trees on the path to your home. Use bright outdoor lighting. Clear any walking paths of anything that might make someone trip, such as rocks or tools. Regularly check to see if handrails are loose or broken. Make sure that both sides of any steps have handrails. Any raised decks and porches should have guardrails on the edges. Have any leaves, snow, or ice cleared regularly. Use sand or salt on walking paths during winter. Clean up any spills in your garage right away. This includes oil or grease spills. What can I do in the bathroom? Use night lights. Install grab bars by the toilet and in the tub and shower. Do not use towel bars as grab bars. Use non-skid mats or decals in the tub or shower. If you need to sit down in the shower, use a plastic, non-slip stool. Keep the floor dry. Clean up any water that spills on the floor as soon as it happens. Remove soap buildup in the tub or shower regularly. Attach bath mats securely with double-sided non-slip rug tape. Do not have throw rugs and other things on the floor that can make you trip. What can I do in the bedroom? Use night lights. Make sure that you have a light by your bed that is easy to reach. Do not use  any sheets or blankets that are too big for your bed. They should not hang down onto the floor. Have a firm chair that has side arms. You can use this for support while you get dressed. Do not have throw rugs and other things on the floor that can make you trip. What can I do in the kitchen? Clean up any spills right away. Avoid walking on wet floors. Keep items that you use a lot in easy-to-reach places. If you need to reach something above you, use a strong step stool that has a grab bar. Keep electrical cords out of the way. Do not use floor polish or wax that makes floors slippery. If you must use wax, use non-skid floor wax. Do not have throw rugs and other things on the floor that can make you trip. What can I do with my stairs? Do not leave any items on the stairs. Make sure that there are handrails on both sides of the stairs and use them. Fix handrails that are broken or loose. Make sure that handrails are as long as the stairways. Check any carpeting to make sure that it is firmly attached to the stairs. Fix any carpet that is loose or worn. Avoid having throw rugs at the top or bottom of the stairs. If you do have throw rugs, attach them to the floor with carpet tape.  Make sure that you have a light switch at the top of the stairs and the bottom of the stairs. If you do not have them, ask someone to add them for you. What else can I do to help prevent falls? Wear shoes that: Do not have high heels. Have rubber bottoms. Are comfortable and fit you well. Are closed at the toe. Do not wear sandals. If you use a stepladder: Make sure that it is fully opened. Do not climb a closed stepladder. Make sure that both sides of the stepladder are locked into place. Ask someone to hold it for you, if possible. Clearly mark and make sure that you can see: Any grab bars or handrails. First and last steps. Where the edge of each step is. Use tools that help you move around (mobility aids)  if they are needed. These include: Canes. Walkers. Scooters. Crutches. Turn on the lights when you go into a dark area. Replace any light bulbs as soon as they burn out. Set up your furniture so you have a clear path. Avoid moving your furniture around. If any of your floors are uneven, fix them. If there are any pets around you, be aware of where they are. Review your medicines with your doctor. Some medicines can make you feel dizzy. This can increase your chance of falling. Ask your doctor what other things that you can do to help prevent falls. This information is not intended to replace advice given to you by your health care provider. Make sure you discuss any questions you have with your health care provider. Document Released: 04/12/2009 Document Revised: 11/22/2015 Document Reviewed: 07/21/2014 Elsevier Interactive Patient Education  2017 [employer].

## 2022-01-28 ENCOUNTER — Other Ambulatory Visit: Payer: Self-pay

## 2022-01-29 ENCOUNTER — Inpatient Hospital Stay: Payer: Medicare HMO | Attending: Hematology

## 2022-01-29 ENCOUNTER — Inpatient Hospital Stay: Payer: Medicare HMO

## 2022-01-29 ENCOUNTER — Inpatient Hospital Stay (HOSPITAL_BASED_OUTPATIENT_CLINIC_OR_DEPARTMENT_OTHER): Payer: Medicare HMO | Admitting: Hematology

## 2022-01-29 VITALS — BP 129/55 | HR 64 | Temp 96.7°F | Resp 18

## 2022-01-29 DIAGNOSIS — C61 Malignant neoplasm of prostate: Secondary | ICD-10-CM

## 2022-01-29 DIAGNOSIS — Z7982 Long term (current) use of aspirin: Secondary | ICD-10-CM | POA: Insufficient documentation

## 2022-01-29 DIAGNOSIS — C7951 Secondary malignant neoplasm of bone: Secondary | ICD-10-CM | POA: Diagnosis not present

## 2022-01-29 DIAGNOSIS — D649 Anemia, unspecified: Secondary | ICD-10-CM | POA: Diagnosis not present

## 2022-01-29 DIAGNOSIS — Z7984 Long term (current) use of oral hypoglycemic drugs: Secondary | ICD-10-CM | POA: Diagnosis not present

## 2022-01-29 DIAGNOSIS — F17210 Nicotine dependence, cigarettes, uncomplicated: Secondary | ICD-10-CM | POA: Insufficient documentation

## 2022-01-29 DIAGNOSIS — Z79899 Other long term (current) drug therapy: Secondary | ICD-10-CM | POA: Insufficient documentation

## 2022-01-29 DIAGNOSIS — R69 Illness, unspecified: Secondary | ICD-10-CM | POA: Diagnosis not present

## 2022-01-29 DIAGNOSIS — C775 Secondary and unspecified malignant neoplasm of intrapelvic lymph nodes: Secondary | ICD-10-CM | POA: Diagnosis not present

## 2022-01-29 DIAGNOSIS — Z5111 Encounter for antineoplastic chemotherapy: Secondary | ICD-10-CM | POA: Insufficient documentation

## 2022-01-29 LAB — COMPREHENSIVE METABOLIC PANEL
ALT: 22 U/L (ref 0–44)
AST: 18 U/L (ref 15–41)
Albumin: 2.6 g/dL — ABNORMAL LOW (ref 3.5–5.0)
Alkaline Phosphatase: 53 U/L (ref 38–126)
Anion gap: 9 (ref 5–15)
BUN: 21 mg/dL (ref 8–23)
CO2: 19 mmol/L — ABNORMAL LOW (ref 22–32)
Calcium: 8.3 mg/dL — ABNORMAL LOW (ref 8.9–10.3)
Chloride: 107 mmol/L (ref 98–111)
Creatinine, Ser: 1.32 mg/dL — ABNORMAL HIGH (ref 0.61–1.24)
GFR, Estimated: 56 mL/min — ABNORMAL LOW (ref >60)
Glucose, Bld: 103 mg/dL — ABNORMAL HIGH (ref 70–99)
Potassium: 3.4 mmol/L — ABNORMAL LOW (ref 3.5–5.1)
Sodium: 135 mmol/L (ref 135–145)
Total Bilirubin: 0.6 mg/dL (ref 0.3–1.2)
Total Protein: 6.7 g/dL (ref 6.5–8.1)

## 2022-01-29 LAB — CBC WITH DIFFERENTIAL/PLATELET
Abs Immature Granulocytes: 0.11 10*3/uL — ABNORMAL HIGH (ref 0.00–0.07)
Basophils Absolute: 0.1 10*3/uL (ref 0.0–0.1)
Basophils Relative: 1 %
Eosinophils Absolute: 0.2 10*3/uL (ref 0.0–0.5)
Eosinophils Relative: 2 %
HCT: 27.8 % — ABNORMAL LOW (ref 39.0–52.0)
Hemoglobin: 9.1 g/dL — ABNORMAL LOW (ref 13.0–17.0)
Immature Granulocytes: 1 %
Lymphocytes Relative: 26 %
Lymphs Abs: 2.6 10*3/uL (ref 0.7–4.0)
MCH: 32.2 pg (ref 26.0–34.0)
MCHC: 32.7 g/dL (ref 30.0–36.0)
MCV: 98.2 fL (ref 80.0–100.0)
Monocytes Absolute: 0.8 10*3/uL (ref 0.1–1.0)
Monocytes Relative: 8 %
Neutro Abs: 6.4 10*3/uL (ref 1.7–7.7)
Neutrophils Relative %: 62 %
Platelets: 410 10*3/uL — ABNORMAL HIGH (ref 150–400)
RBC: 2.83 MIL/uL — ABNORMAL LOW (ref 4.22–5.81)
RDW: 15.5 % (ref 11.5–15.5)
WBC: 10.1 10*3/uL (ref 4.0–10.5)
nRBC: 0 % (ref 0.0–0.2)

## 2022-01-29 LAB — PSA: Prostatic Specific Antigen: 12.34 ng/mL — ABNORMAL HIGH (ref 0.00–4.00)

## 2022-01-29 LAB — MAGNESIUM: Magnesium: 1.2 mg/dL — ABNORMAL LOW (ref 1.7–2.4)

## 2022-01-29 MED ORDER — HEPARIN SOD (PORK) LOCK FLUSH 100 UNIT/ML IV SOLN
500.0000 [IU] | Freq: Once | INTRAVENOUS | Status: AC
  Administered 2022-01-29: 500 [IU] via INTRAVENOUS

## 2022-01-29 MED ORDER — SODIUM CHLORIDE 0.9% FLUSH
10.0000 mL | Freq: Once | INTRAVENOUS | Status: AC
  Administered 2022-01-29: 10 mL via INTRAVENOUS

## 2022-01-29 MED ORDER — SODIUM CHLORIDE 0.9 % IV SOLN: INTRAVENOUS | Status: DC

## 2022-01-29 MED ORDER — MAGNESIUM SULFATE 2 GM/50ML IV SOLN
2.0000 g | INTRAVENOUS | Status: AC
  Administered 2022-01-29 (×2): 2 g via INTRAVENOUS
  Filled 2022-01-29 (×2): qty 50

## 2022-01-29 NOTE — Addendum Note (Signed)
Addended by: Derek Jack on: 01/29/2022 12:42 PM   Modules accepted: Orders

## 2022-01-29 NOTE — Progress Notes (Signed)
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 Report date is 05/07/2021.  The Multi-Cancer + RNA Panel offered by [facility] includes sequencing and/or deletion/duplication analysis of the following 84 genes:  AIP*, ALK, APC*, ATM*, AXIN2*, BAP1*, BARD1*, BLM*, BMPR1A*, BRCA1*, BRCA2*, BRIP1*, CASR, CDC73*, CDH1*, CDK4, CDKN1B*, CDKN1C*, CDKN2A, CEBPA, CHEK2*, CTNNA1*, DICER1*, DIS3L2*, EGFR, EPCAM, FH*, FLCN*, GATA2*, GPC3, GREM1, HOXB13, HRAS, KIT, MAX*, MEN1*, MET, MITF, MLH1*, MSH2*, MSH3*, MSH6*, MUTYH*, NBN*, NF1*, NF2*, NTHL1*, PALB2*, PDGFRA, PHOX2B, PMS2*, POLD1*, POLE*, POT1*, PRKAR1A*, PTCH1*, PTEN*, RAD50*, RAD51C*, RAD51D*, RB1*, RECQL4, RET, RUNX1*, SDHA*, SDHAF2*, SDHB*, SDHC*, SDHD*, SMAD4*, SMARCA4*, SMARCB1*, SMARCE1*, STK11*, SUFU*, TERC, TERT, TMEM127*, Tp53*, TSC1*, TSC2*, VHL*, WRN*, and WT1.  RNA analysis is performed for * genes.   10/21/2021 -  Chemotherapy   Patient is on Treatment Plan : PROSTATE Docetaxel + Prednisone q21d       CANCER STAGING:  Cancer Staging  Prostate cancer [facility]) Staging form: Prostate, [name] 7th Edition - Clinical stage from 04/02/2021: rT2b, N1, PSA: 10 to 19, Gleason 7 - Unsigned   INTERVAL  HISTORY:  Andrew [name], a 76 y.o. male, returns for routine follow-up and consideration for next cycle of chemotherapy. Andrew [name] was last seen on 01/08/2022.  Due for cycle #4 of Taxotere today.   Overall, he tells me he has been feeling pretty well. He has lost 4 lbs since his last visit. He denies recent falls. He reports occasional diarrhea. He denies ankle swellings.   Overall, he will not receive his next cycle of chemo today.   REVIEW OF SYSTEMS:  Review of Systems  Constitutional:  Positive for unexpected weight change (-4 lbs). Negative for appetite change and fatigue.  Cardiovascular:  Negative for leg swelling.  Gastrointestinal:  Positive for diarrhea.  All other systems reviewed and are negative.   PAST MEDICAL/SURGICAL HISTORY:  Past Medical History:  Diagnosis Date   AKI (acute kidney injury) ([place]) 10/04/2021   Anemia    BPH (benign prostatic hyperplasia)    Sees Dr [name]   Cataract    Diabetes mellitus without complication [facility])    Educated about COVID-19 virus infection 07/18/2019   Genetic testing 05/09/2021   [facility] Multi-Cancer Panel was Negative. Report date is 05/07/2021.  The Multi-Cancer + RNA Panel offered by [facility] includes sequencing and/or deletion/duplication analysis of the following 84 genes:  AIP*, ALK, APC*, ATM*, AXIN2*, BAP1*, BARD1*, BLM*, BMPR1A*, BRCA1*, BRCA2*, BRIP1*, CASR, CDC73*, CDH1*, CDK4, CDKN1B*, CDKN1C*, CDKN2A, CEBPA, CHEK2*, CTNNA1*, DICER1*, DIS3L2*, EGFR, EPCAM, FH*, F   Hyperlipidemia    Hypertension    Low serum vitamin D    Port-A-Cath in place 10/09/2021   Prostate cancer ([place]) 2008   w/seed implantation and radiation   Past Surgical History:  Procedure Laterality Date   COLONOSCOPY  [id number]   PORTACATH PLACEMENT Right 10/17/2021   Procedure: INSERTION PORT-A-CATH, R IJ;  Surgeon: [name], DO;  Location: [name];  Service: General;  Laterality: Right;  pt needs to have glucose checked AM of surgery    PROSTATE SURGERY  2008   seed implant    SOCIAL HISTORY:  Social History   Socioeconomic History   Marital status: Married    Spouse name: Goff   Number of children: 2   Years of education: 12   Highest education level: 12th grade  Occupational History   Occupation: [name]    Comment: Retired Geologist, engineering  Tobacco Use   Smoking status: Every Day    Packs/day: 1.00    Years: 55.00    Total pack years: 55.00    Types: Cigarettes    Start date: 06/30/1965   Smokeless tobacco: Never   Tobacco comments:    Has tried nicotine replacement gum  Vaping Use   Vaping Use: Never used  Substance and Sexual Activity   Alcohol use: No   Drug use: No   Sexual activity: Yes  Other Topics Concern   Not on file  Social History Narrative   Retired, lives at home with wife [name], two grown children, enjoys gardening     Social Determinants of Health   Financial Resource Strain: Low Risk  (01/27/2022)   Overall Financial Resource Strain (CARDIA)    Difficulty of Paying Living Expenses: Not hard at all  Food Insecurity: No Food Insecurity (01/27/2022)   Hunger Vital Sign    Worried About Running Out of Food in the Last Year: Never true    [place] in the Last Year: Never true  Transportation Needs: No Transportation Needs (01/27/2022)   PRAPARE - Hydrologist (Medical): No    Lack of Transportation (Non-Medical): No  Physical Activity: Inactive (01/27/2022)   Exercise Vital Sign    Days of Exercise per Week: 0 days    Minutes of Exercise per Session: 0 min  Stress: No Stress Concern Present (01/27/2022)   [place]    Feeling of Stress : Not at all  Social Connections: Moderately Integrated (01/27/2022)   Social Connection and Isolation Panel [NHANES]    Frequency of Communication with Friends and Family: More than three times a week    Frequency of Social Gatherings with  Friends and Family: Once a week    Attends Religious Services: More than 4 times per year    Active Member of [employer] or Organizations: No    Attends Archivist Meetings: Never    Marital Status: Married  Human resources officer Violence: Not At Risk (01/27/2022)   Humiliation, Afraid, Rape, and Kick questionnaire    Fear of Current or Ex-Partner: No    Emotionally Abused: No    Physically Abused: No    Sexually Abused: No    FAMILY HISTORY:  Family History  Problem Relation Age of Onset   Diabetes Mother    Heart disease Mother    Kidney disease Mother        DIALYSIS   Emphysema Father    Deep vein thrombosis Sister    Kidney disease Sister    Lung cancer Sister    Colon polyps Brother    Hypertension Brother    Gout Brother    Colon cancer Neg Hx    Esophageal cancer Neg Hx    Rectal  cancer Neg Hx    Prostate cancer Neg Hx     CURRENT MEDICATIONS:  Current Outpatient Medications  Medication Sig Dispense Refill   alfuzosin (UROXATRAL) 10 MG 24 hr tablet Take 1 tablet (10 mg total) by mouth at bedtime. 90 tablet 3   ammonium lactate (AMLACTIN) 12 % cream APPLY TWICE DAILY TO DRY SKIN ON FEET (Patient not taking: Reported on 01/27/2022)     aspirin 81 MG tablet Take 1 tablet (81 mg total) by mouth daily with breakfast. 30 tablet 3   atorvastatin (LIPITOR) 40 MG tablet Take 1 tablet (40 mg total) by mouth daily. (Patient taking differently: Take 40 mg by mouth at bedtime.) 90 tablet 3   azelastine (ASTELIN) 0.1 % nasal spray Place 1 spray into both nostrils 2 (two) times daily as needed for allergies.     BOOSTRIX 5-2.5-18.5 LF-MCG/0.5 injection      cholecalciferol (VITAMIN D) 1000 UNITS tablet Take 1,000 Units by mouth daily.      Dapagliflozin-sAXagliptin (QTERN) 10-5 MG TABS Take 1 tablet by mouth in the morning. 90 tablet 5   DOCEtaxel (TAXOTERE IV) Inject into the vein every 21 ( twenty-one) days.     docusate sodium (COLACE) 100 MG capsule Take 1 capsule (100 mg  total) by mouth 2 (two) times daily. 60 capsule 2   dronabinol (MARINOL) 5 MG capsule Take 1 capsule (5 mg total) by mouth 2 (two) times daily before a meal. 60 capsule 3   escitalopram (LEXAPRO) 10 MG tablet Take 1 tablet (10 mg total) by mouth daily. 90 tablet 1   FARXIGA 10 MG TABS tablet Take 10 mg by mouth daily.     glimepiride (AMARYL) 2 MG tablet Take 2 mg by mouth 2 (two) times daily.     glucose blood test strip 1 each by Other route as needed for other. Use as instructed to test blood sugar daily.  One Touch Verio Test Strips DX: E11.65 100 each 11   lidocaine-prilocaine (EMLA) cream Apply a small amount to port a cath site and cover with plastic wrap 1 hour prior to chemotherapy appointments 30 g 3   lisinopril-hydrochlorothiazide (ZESTORETIC) 20-25 MG tablet Take 1 tablet by mouth daily. 90 tablet 3   magnesium oxide (MAG-OX) 400 (240 Mg) MG tablet Take 1 tablet (400 mg total) by mouth 2 (two) times daily. 60 tablet 2   megestrol (MEGACE) 400 MG/10ML suspension Take 10 mLs (400 mg total) by mouth 2 (two) times daily. 480 mL 4   metFORMIN (GLUCOPHAGE) 1000 MG tablet Take 1 tablet (1,000 mg total) by mouth 2 (two) times daily with a meal. 180 tablet 2   oxyCODONE (ROXICODONE) 5 MG immediate release tablet Take 1 tablet (5 mg total) by mouth every 8 (eight) hours as needed. 10 tablet 0   potassium chloride SA (KLOR-CON M) 20 MEQ tablet Take 1 tablet (20 mEq total) by mouth daily. 30 tablet 6   pramoxine (PROCTOFOAM) 1 % foam Place 1 application. rectally 3 (three) times daily as needed for anal itching. 15 g 0   prochlorperazine (COMPAZINE) 10 MG tablet Take 1 tablet (10 mg total) by mouth every 6 (six) hours as needed (Nausea or vomiting). 30 tablet 2   No current facility-administered medications for this visit.   Facility-Administered Medications Ordered in Other Visits  Medication Dose Route Frequency Provider Last Rate Last Admin   0.9 %  sodium chloride infusion   Intravenous  Continuous Derek Jack, MD 20 mL/hr at 01/08/22  1513 Infusion Verify at 01/08/22 1513   sodium chloride flush (NS) 0.9 % injection 10 mL  10 mL Intracatheter PRN Derek Jack, MD   10 mL at 01/08/22 1310    ALLERGIES:  No Known Allergies  PHYSICAL EXAM:  Performance status (ECOG): 1 - Symptomatic but completely ambulatory  There were no vitals filed for this visit. Wt Readings from Last 3 Encounters:  01/27/22 176 lb (79.8 kg)  01/08/22 176 lb 6.4 oz (80 kg)  12/23/21 174 lb (78.9 kg)   Physical Exam Vitals reviewed.  Constitutional:      Appearance: Normal appearance.  Cardiovascular:     Rate and Rhythm: Normal rate and regular rhythm.     Pulses: Normal pulses.     Heart sounds: Normal heart sounds.  Pulmonary:     Effort: Pulmonary effort is normal.     Breath sounds: Normal breath sounds.  Musculoskeletal:     Right lower leg: No edema.     Left lower leg: No edema.  Neurological:     General: No focal deficit present.     Mental Status: He is alert and oriented to person, place, and time.  Psychiatric:        Mood and Affect: Mood normal.        Behavior: Behavior normal.     LABORATORY DATA:  I have reviewed the labs as listed.     Latest Ref Rng & Units 01/08/2022    8:18 AM 12/23/2021    8:30 AM 12/09/2021    8:13 AM  CBC  WBC 4.0 - 10.5 K/uL 6.7  8.0  8.6   Hemoglobin 13.0 - 17.0 g/dL 9.9  10.0  9.1   Hematocrit 39.0 - 52.0 % 30.0  30.6  27.9   Platelets 150 - 400 K/uL 164  257  360       Latest Ref Rng & Units 01/08/2022    8:18 AM 12/23/2021    8:30 AM 12/09/2021    8:13 AM  CMP  Glucose 70 - 99 mg/dL 133  106  96   BUN 8 - 23 mg/dL _0 Creatinine 0.61 - 1.24 mg/dL 1.52  1.29  1.28   Sodium 135 - 145 mmol/L 137  135  138   Potassium 3.5 - 5.1 mmol/L 3.4  4.1  3.6   Chloride 98 - 111 mmol/L 112  107  107   CO2 22 - 32 mmol/L _1 Calcium 8.9 - 10.3 mg/dL 8.3  8.7  8.6   Total Protein 6.5 - 8.1 g/dL 6.5  7.0  7.1    Total Bilirubin 0.3 - 1.2 mg/dL 0.5  0.8  0.8   Alkaline Phos 38 - 126 U/L 55  64  75   AST 15 - 41 U/L _2 ALT 0 - 44 U/L _3 DIAGNOSTIC IMAGING:  I have independently reviewed the scans and discussed with the patient. CT Abdomen Pelvis W Contrast  Result Date: 01/02/2022 CLINICAL DATA:  Metastatic prostate cancer, ongoing chemotherapy, assess treatment response, * Tracking Code: BO * EXAM: CT ABDOMEN AND PELVIS WITH CONTRAST TECHNIQUE: Multidetector CT imaging of the abdomen and pelvis was performed using the standard protocol following bolus administration of intravenous contrast. RADIATION DOSE REDUCTION: This exam was performed according to the departmental dose-optimization program which includes automated exposure control, adjustment of the mA and/or kV  according to patient size and/or use of iterative reconstruction technique. CONTRAST:  78mL OMNIPAQUE IOHEXOL 300 MG/ML SOLN, additional oral enteric contrast COMPARISON:  CT abdomen pelvis, 10/09/2021, PET-CT, 04/24/2021 FINDINGS: Lower chest: No acute abnormality.  Coronary artery calcifications. Hepatobiliary: No solid liver abnormality is seen. No gallstones, gallbladder wall thickening, or biliary dilatation. Pancreas: Unremarkable. No pancreatic ductal dilatation or surrounding inflammatory changes. Spleen: Normal in size without significant abnormality. Adrenals/Urinary Tract: Stable, definitively benign fat containing small right adrenal adenoma (series 2, image 25). Simple, benign bilateral renal cysts, for which no further follow-up or characterization is required. Kidneys are otherwise normal, without renal calculi, solid lesion, or hydronephrosis. Bladder is unremarkable. Stomach/Bowel: Stomach is within normal limits. Appendix appears normal. No evidence of bowel wall thickening, distention, or inflammatory changes. Descending colonic diverticula. Vascular/Lymphatic: Aortic atherosclerosis. Unchanged bulky lymph node  at the left iliac bifurcation measuring 3.8 x 3.2 cm (series 2, image 63). No other enlarged abdominal or pelvic lymph nodes. Reproductive: Prostate brachytherapy. Other: No abdominal wall hernia or abnormality. No ascites. Musculoskeletal: No acute or significant osseous findings. Gentle dextroscoliosis of the thoracolumbar spine. IMPRESSION: 1. Unchanged bulky lymph node at the left iliac bifurcation. 2. No new lymphadenopathy or soft tissue metastatic disease in the abdomen or pelvis. 3. Previously identified PET avid osseous metastatic disease is not appreciated by CT. 4. Prostate brachytherapy. 5. Coronary artery disease. Aortic Atherosclerosis (ICD10-I70.0). Electronically Signed   By: Delanna Ahmadi M.D.   On: 01/02/2022 13:39     ASSESSMENT:  Metastatic castration refractory prostate cancer to left iliac lymph node: - History of prostate adenocarcinoma, T2b, Gleason 7, PSA 16.6 - Treated with neoadjuvant hormonal therapy, EBRT followed by brachytherapy with iodine 125 seeds in 2010 - Rising PSA levels in February 2019 - Bone scan on 08/10/2017 negative.  CTAP on 08/26/2017 shows enlarged left pelvic sidewall lymph node 2.2 cm. - Lupron initiated on 08/10/2019. - CTAP on 03/09/2020 with left internal/external iliac lymph node measuring 2.1 x 2.8 cm (2.1 x 2.2 cm), craniocaudal 4.1 cm, previously 3.4 cm. - Enzalutamide to 160 mg daily started on 04/17/2020.  Discontinued end of October 2022. - PSA on 02/20/2021 (5.2), 07/11/2020 (2.0), 02/08/2020 (3.1) - CTAP on 03/07/2021 with left iliac bifurcation lymph node measuring 3.5 x 2.6 cm (previously 2.8 x 2.0 cm) - Bone scan on 03/07/2021 negative. - Left pelvic lymph node biopsy on 04/08/2021, metastatic prostate cancer. - We have reviewed PSMA PET scan results which showed enlarged left iliac lymph node 33 mm with SUV 285.  1 mm lymph node and 4 mm node between IVC and aorta.  Activity in the right acetabulum, left iliac bone, left transverse process of T11  vertebral body, spine of the right scapula. - Guardant 360 did not show any targetable mutations.  Did not show MSI high. - NGS testing could not be done because of less than 100 tumor cells. - Abiraterone and prednisone from 07/15/2021 through 10/01/2021 with progression. - Germline mutation [facility] testing was negative. - Cycle 1 of docetaxel on 10/21/2021.   PLAN:  Metastatic castration refractory prostate cancer to the left iliac lymph node: - CTAP (01/02/2022): Unchanged bulky lymph nodes in the left iliac bifurcation with no new findings. - He lost about 3.5 pounds since last visit. - Reviewed labs from 01/29/2022.  LFTs are normal.  His PSA on 01/08/2022 increased to 12.71 from 7.23. - I will hold his chemotherapy today as his last PSA has gone up.  We are waiting for PSA  from today.  If it continues to go high, will consider doing a bone scan. -If the PSA comes down, we will proceed with next cycle of chemotherapy.  2.  Weight loss: - He lost 9 pounds after cycle 2 and 13 pounds after cycle 1. - He lost about 3.5 pounds after cycle 3.  He is reportedly eating 3 times a day and drinking Ensure 2 cans/day.  He is not taking Megace on a consistent basis.  No falls reported.  3.  Normocytic anemia: - He completed 3 doses of Venofer.  Hemoglobin is 9.1.  4.  Bone metastasis: - Denosumab will be started after dental evaluation.  5.  Hypomagnesemia: -He will receive magnesium 4 g IV today his magnesium was low at 1.2. - Continue magnesium twice daily.   Orders placed this encounter:  No orders of the defined types were placed in this encounter.    Derek Jack, MD [place] [phone]   I, Thana Ates, am acting as a scribe for Dr. Derek Jack.  I, Derek Jack MD, have reviewed the above documentation for accuracy and completeness, and I agree with the above.

## 2022-01-29 NOTE — Patient Instructions (Signed)
[facility]  Discharge Instructions: Thank you for choosing [facility] to provide your oncology and hematology care.  If you have a lab appointment with the Cancer Center, please come in thru the Main Entrance and check in at the main information desk.  Wear comfortable clothing and clothing appropriate for easy access to any Portacath or PICC line.   We strive to give you quality time with your provider. You may need to reschedule your appointment if you arrive late (15 or more minutes).  Arriving late affects you and other patients whose appointments are after yours.  Also, if you miss three or more appointments without notifying the office, you may be dismissed from the clinic at the provider's discretion.      For prescription refill requests, have your pharmacy contact our office and allow 72 hours for refills to be completed.      To help prevent nausea and vomiting after your treatment, we encourage you to take your nausea medication as directed.  BELOW ARE SYMPTOMS THAT SHOULD BE REPORTED IMMEDIATELY: *FEVER GREATER THAN 100.4 F (38 C) OR HIGHER *CHILLS OR SWEATING *NAUSEA AND VOMITING THAT IS NOT CONTROLLED WITH YOUR NAUSEA MEDICATION *UNUSUAL SHORTNESS OF BREATH *UNUSUAL BRUISING OR BLEEDING *URINARY PROBLEMS (pain or burning when urinating, or frequent urination) *BOWEL PROBLEMS (unusual diarrhea, constipation, pain near the anus) TENDERNESS IN MOUTH AND THROAT WITH OR WITHOUT PRESENCE OF ULCERS (sore throat, sores in mouth, or a toothache) UNUSUAL RASH, SWELLING OR PAIN  UNUSUAL VAGINAL DISCHARGE OR ITCHING   Items with * indicate a potential emergency and should be followed up as soon as possible or go to the Emergency Department if any problems should occur.  Please show the CHEMOTHERAPY ALERT CARD or IMMUNOTHERAPY ALERT CARD at check-in to the Emergency Department and triage nurse.  Should you have questions after your visit or need to  cancel or reschedule your appointment, please contact [facility] [phone]  and follow the prompts.  Office hours are 8:00 a.m. to 4:30 p.m. Monday - Friday. Please note that voicemails left after 4:00 p.m. may not be returned until the following business day.  We are closed weekends and major holidays. You have access to a nurse at all times for urgent questions. Please call the main number to the clinic [phone] and follow the prompts.  For any non-urgent questions, you may also contact your provider using MyChart. We now offer e-Visits for anyone 18 and older to request care online for non-urgent symptoms. For details visit [email or web address].   Also download the MyChart app! Go to the app store, search "MyChart", open the app, select [facility], and log in with your MyChart username and password.  Masks are optional in the cancer centers. If you would like for your care team to wear a mask while they are taking care of you, please let them know. For doctor visits, patients may have with them one support person who is at least 76 years old. At this time, visitors are not allowed in the infusion area.  

## 2022-01-29 NOTE — Patient Instructions (Signed)
Andrew [name] at [facility] Discharge Instructions   You were seen and examined today by Dr. Delton Coombes.  He reviewed your lab work. Your magnesium is very low. We will give you magnesium IV in the clinic today.  Based on what your PSA level is today will determine whether or not we proceed with treatment. If it has gone up further, we will discontinue treatment.   Return as scheduled.    Thank you for choosing [place] at [facility] to provide your oncology and hematology care.  To afford each patient quality time with our provider, please arrive at least 15 minutes before your scheduled appointment time.   If you have a lab appointment with the [facility] please come in thru the Main Entrance and check in at the main information desk.  You need to re-schedule your appointment should you arrive 10 or more minutes late.  We strive to give you quality time with our providers, and arriving late affects you and other patients whose appointments are after yours.  Also, if you no show three or more times for appointments you may be dismissed from the clinic at the providers discretion.     Again, thank you for choosing [facility].  Our hope is that these requests will decrease the amount of time that you wait before being seen by our physicians.       _____________________________________________________________  Should you have questions after your visit to [facility], please contact our office at [phone] and follow the prompts.  Our office hours are 8:00 a.m. and 4:30 p.m. Monday - Friday.  Please note that voicemails left after 4:00 p.m. may not be returned until the following business day.  We are closed weekends and major holidays.  You do have access to a nurse 24-7, just call the main number to the clinic [phone] and do not press any options, hold on the line and a nurse will answer the phone.    For  prescription refill requests, have your pharmacy contact our office and allow 72 hours.    Due to Covid, you will need to wear a mask upon entering the hospital. If you do not have a mask, a mask will be given to you at the Main Entrance upon arrival. For doctor visits, patients may have 1 support person age 55 or older with them. For treatment visits, patients can not have anyone with them due to social distancing guidelines and our immunocompromised population.

## 2022-01-29 NOTE — Progress Notes (Signed)
Patient tolerated Magnesium infusion with no complaints voiced.  Port site clean and dry with good blood return noted before and after hydration.  No bruising or swelling noted with port.  Band aid applied.  VSS with discharge and left ambulatory with no s/s of distress noted.

## 2022-01-29 NOTE — Progress Notes (Signed)
Patients port flushed without difficulty.  Good blood return noted with no bruising or swelling noted at site.  Stable during access and blood draw.  Patient to remain accessed for treatment. 

## 2022-01-30 ENCOUNTER — Other Ambulatory Visit: Payer: Self-pay | Admitting: Family Medicine

## 2022-01-30 ENCOUNTER — Ambulatory Visit: Payer: Medicare HMO

## 2022-01-31 ENCOUNTER — Inpatient Hospital Stay: Payer: Medicare HMO

## 2022-02-04 ENCOUNTER — Encounter (HOSPITAL_COMMUNITY)
Admission: RE | Admit: 2022-02-04 | Discharge: 2022-02-04 | Disposition: A | Discharge status: Home / Self Care | Payer: Medicare HMO | Source: Ambulatory Visit | Attending: Hematology | Admitting: Hematology

## 2022-02-04 ENCOUNTER — Encounter (HOSPITAL_COMMUNITY): Payer: Self-pay

## 2022-02-04 DIAGNOSIS — C61 Malignant neoplasm of prostate: Secondary | ICD-10-CM | POA: Insufficient documentation

## 2022-02-04 MED ORDER — TECHNETIUM TC 99M MEDRONATE IV KIT
20.0000 | PACK | Freq: Once | INTRAVENOUS | Status: AC | PRN
  Administered 2022-02-04: 20.2 via INTRAVENOUS

## 2022-02-05 ENCOUNTER — Other Ambulatory Visit: Payer: Medicare HMO

## 2022-02-05 ENCOUNTER — Ambulatory Visit: Payer: Medicare HMO | Admitting: *Deleted

## 2022-02-05 NOTE — Patient Instructions (Signed)
Visit Information  Thank you for taking time to visit with me today. Please don't hesitate to contact me if I can be of assistance to you before your next scheduled telephone appointment.  Following are the goals we discussed today:  Patient will self administer medications as prescribed Patient will attend church or other social activities Patient will continue to perform ADL's independently Patient will continue to perform IADL's independently Patient will call provider office for new concerns or questions Patient will keep all medical appointments Patient will check and record blood pressure 3 times per week and will call PCP with any readings outside of recommended range Patient will check and record blood sugar daily and will call PCP with any readings outside of recommended range Continue to eat iron rich foods Keep telephone appointment with Jackelyn Poling, RN Care Coordinator on 02/27/22 at 11:30  Plan: Telephone follow up appointment with care management team member scheduled for:  Care Coordination with Jackelyn Poling, RN Care Coordinator on 02/27/22 at 11:30  I have enjoyed working with you through the Chronic Care Management Program at [facility].    Please call the care guide team at [phone] if you need to cancel or reschedule your appointment.   If you are experiencing a Mental Health or [place] or need someone to talk to, please call the [facility]: [phone] call [phone]   The patient verbalized understanding of instructions, educational materials, and care plan provided today and agreed to receive a mailed copy of patient instructions, educational materials, and care plan.   Chong Sicilian, BSN, RN-BC Proofreader Dial: [phone]

## 2022-02-05 NOTE — Chronic Care Management (AMB) (Signed)
Care Management    RN Visit Note  02/05/2022 Name: Andrew [name] MRN: [id number] DOB: June 22, 1946  Subjective: ACEA YAGI is a 76 y.o. year old male who is a primary care patient of Stacks, Cletus Gash, MD. The care management team was consulted for assistance with disease management and care coordination needs.    Engaged with patient by telephone for follow up visit   Due to CCM program changes, RN Care Plans have been closed. CCM status changed to "not enrolled" because he doesn't have a current referral after 10/28/21.   Assessment: Review of patient past medical history, allergies, medications, health status, including review of consultants reports, laboratory and other test data, was performed as part of comprehensive evaluation and provision of chronic care management services.   SDOH (Social Determinants of Health) assessments and interventions performed:    Care Plan  No Known Allergies  Outpatient Encounter Medications as of 02/05/2022  Medication Sig Note   alfuzosin (UROXATRAL) 10 MG 24 hr tablet Take 1 tablet (10 mg total) by mouth at bedtime.    ammonium lactate (AMLACTIN) 12 % cream     aspirin 81 MG tablet Take 1 tablet (81 mg total) by mouth daily with breakfast.    atorvastatin (LIPITOR) 40 MG tablet Take 1 tablet (40 mg total) by mouth daily. (Patient taking differently: Take 40 mg by mouth at bedtime.)    azelastine (ASTELIN) 0.1 % nasal spray Place 1 spray into both nostrils 2 (two) times daily as needed for allergies.    BOOSTRIX 5-2.5-18.5 LF-MCG/0.5 injection     cholecalciferol (VITAMIN D) 1000 UNITS tablet Take 1,000 Units by mouth daily.     Dapagliflozin-sAXagliptin (QTERN) 10-5 MG TABS Take 1 tablet by mouth in the morning. 12/05/2021: Via [place] patient [place]    DOCEtaxel (TAXOTERE IV) Inject into the vein every 21 ( twenty-one) days.    docusate sodium (COLACE) 100 MG capsule Take 1 capsule (100 mg total) by mouth 2 (two) times daily.    dronabinol  (MARINOL) 5 MG capsule Take 1 capsule (5 mg total) by mouth 2 (two) times daily before a meal.    escitalopram (LEXAPRO) 10 MG tablet Take 1 tablet (10 mg total) by mouth daily.    FARXIGA 10 MG TABS tablet Take 10 mg by mouth daily. 12/05/2021: samples   glimepiride (AMARYL) 2 MG tablet Take 2 mg by mouth 2 (two) times daily.    glucose blood test strip 1 each by Other route as needed for other. Use as instructed to test blood sugar daily.  One Touch Verio Test Strips DX: E11.65 10/17/2019: One Touch Verio   lidocaine-prilocaine (EMLA) cream Apply a small amount to port a cath site and cover with plastic wrap 1 hour prior to chemotherapy appointments (Patient not taking: Reported on 01/29/2022)    lisinopril-hydrochlorothiazide (ZESTORETIC) 20-25 MG tablet Take 1 tablet by mouth daily.    magnesium oxide (MAG-OX) 400 (240 Mg) MG tablet Take 1 tablet (400 mg total) by mouth 2 (two) times daily.    megestrol (MEGACE) 400 MG/10ML suspension Take 10 mLs (400 mg total) by mouth 2 (two) times daily.    metFORMIN (GLUCOPHAGE) 1000 MG tablet Take 1 tablet (1,000 mg total) by mouth 2 (two) times daily with a meal.    oxyCODONE (ROXICODONE) 5 MG immediate release tablet Take 1 tablet (5 mg total) by mouth every 8 (eight) hours as needed.    potassium chloride SA (KLOR-CON M) 20 MEQ tablet Take 1  tablet (20 mEq total) by mouth daily.    pramoxine (PROCTOFOAM) 1 % foam Place 1 application. rectally 3 (three) times daily as needed for anal itching.    prochlorperazine (COMPAZINE) 10 MG tablet Take 1 tablet (10 mg total) by mouth every 6 (six) hours as needed (Nausea or vomiting). (Patient not taking: Reported on 01/29/2022)    Facility-Administered Encounter Medications as of 02/05/2022  Medication   0.9 %  sodium chloride infusion   sodium chloride flush (NS) 0.9 % injection 10 mL    Patient Active Problem List   Diagnosis Date Noted   Iron deficiency anemia 12/02/2021   Port-A-Cath in place 10/09/2021    Hyperglycemia due to diabetes mellitus ([place]) 10/04/2021   Aortic atherosclerosis ([place]) 01/18/2021   Primary insomnia 07/26/2020   Sacroiliitis ([place]) 07/26/2020   Nocturia 02/08/2020   Nonspecific abnormal electrocardiogram (ECG) (EKG) 07/18/2019   Type 2 diabetes mellitus with complication, without long-term current use of insulin (HCC) 07/18/2019   SOB (shortness of breath) 12/10/2016   Erectile dysfunction due to arterial insufficiency 10/30/2016   Polyp of colon 10/30/2016   Peripheral vascular insufficiency ([place]) 06/13/2016   Vitamin D deficiency 08/24/2015   Prostate cancer ([place]) 04/07/2014   Type 2 diabetes mellitus without complications ([place]) 93/73/4287   Benign prostatic hyperplasia with urinary obstruction 09/14/2010   Essential hypertension 09/14/2010   Hyperlipidemia associated with type 2 diabetes mellitus ([place]) 09/14/2010   Tobacco abuse 09/14/2010    Conditions to be addressed/monitored: HTN, DMII, and prostate cancer  Care Plan : [facility] Care Plan  Updates made by Ilean China, RN since 02/05/2022 12:00 AM  Completed 02/05/2022   Problem: Chronic Diseaes Managment Needs Resolved 02/05/2022  Priority: High     Long-Range Goal: Work with [facility] Regarding Care Management and Care Coordination Associated with HTN, DM, metastatic prostate cancer Completed 02/05/2022  Start Date: 02/22/2021  Expected End Date: 02/22/2022  Recent Progress: On track  Priority: High  Note:   02/05/2022 Closing RNCM care plans due to CCM program changes. Pt can be referred for Care Coordination services if needed. Appt scheduled with Jackelyn Poling, RN Care Coordinator   Current Barriers:  Chronic Disease Management support and education needs related to poor oral health in a patient with hypertension, diabetes, and metastatic prostate cancer Chronic disease management and care coordination needs related to HTN, DM, and metastatic prostate cancer Unable to independently drive   RNCM Clinical  Goal(s):  Patient will continue to work with RN Care Manager to address care management and care coordination needs related to HTN, DMII, and , metastatic prostate cancer, poor oral health  through collaboration with RN Care manager/LCSW, provider, and care team.  Patient will keep all oncology follow-up appointments Patient will schedule and keep appointment with dentist for evaluation and treatment  Interventions: 1:1 collaboration with primary care provider regarding development and update of comprehensive plan of care as evidenced by provider attestation and co-signature Inter-disciplinary care team collaboration (see longitudinal plan of care) Evaluation of current treatment plan related to  self management and patient's adherence to plan as established by provider   Diabetes:  (Status: Goal on Track (progressing): YES.)  Lab Results  Component Value Date   HGBA1C 7.9 (H) 10/02/2021   HGBA1C 5.6 05/01/2021   HGBA1C 6.1 01/29/2021   Lab Results  Component Value Date   MICROALBUR 100 11/21/2014   [place] 57 10/02/2021   CREATININE 1.32 (H) 01/29/2022  Assessed patient's understanding of A1c goal: <7% Reviewed and discussed  medications Counseled on importance of regular laboratory monitoring as prescribed        Encouraged to continue checking and recording blood sugar daily and as needed       Encouraged to call provider for findings outside established parameters       Discussed home blood sugar monitoring and average blood sugar readings Discussed the impact that stress and other medical conditions can have on blood sugar Discussed home blood sugar testing. Patient reports that is mostly normal. Discussed diet and appetite Assessed social determinant of health barriers   Hypertension: (Status: Goal on Track (progressing): YES.) Last practice recorded BP readings:  BP Readings from Last 3 Encounters:  01/29/22 (!) 129/55  01/10/22 130/73  01/08/22 138/62   Reviewed  medications with patient and discussed importance of medication adherence Encouraged patient to check and record blood pressure at least 3 times per week and to call PCP with any readings outside of recommended range Reinforced low sodium/DASH diet Discussed plans with patient for ongoing care management follow up and provided patient with direct contact information for care management team   Oral Health:  (Status: Patient declined further engagement on this goal.) Reviewed and discussed medications Previously placed referral to [place] who contacted the patient and provided him with information on the [facility].  Provided with contact number again and encouraged to reach out for an appointment [phone] Reviewed and discussed recent oncology note and plan of care.  Oncologist would like to start monthly injections of denosumab but patient needs to have dental work completed prior to starting. Explained this to patient.  Provided with RN Care Manager contact number and encouraged to reach out as needed   Oncology:  (Status: Goal on track: YES.) Reviewed and discussed recent oncology visits, imaging, and laboratory reports Verified available transportation to appointments and treatments. Has consistent/reliable transportation: Yes Assessed support system. Has consistent/reliable family or other support: Yes Nutrition assessment performed. Appetite waxes and wanes. He is prescribed megace and marinol. He is drinking supplements as needed and trying to eat regularly. Reviewed upcoming appt with Dr Delton Coombes with the [facility] Discussed low iron and iron infusions. Low iron did impact his ability to have chemo for a few sessions.  Reviewed and discussed medications and importance of compliance Therapeutic listening utilized regarding prognosis and treatment plan Encouraged to reach out to [facility] as needed  Discussed mobility and ability to perform  ADLs Discussed energy levels Encouraged to rest as needed but to get in some exercise, like walking, as tolerated. Patient does walk down the street when it's not too hot.  Reached out to PCP for new CCM referral. Patient would like to remain in the program at least for the duration of his cancer treatment. Today's visit will be for care coordination since there is no current referral on file.    SDOH Barriers (Status: Goal Met. Provided with Needed Resource Information)  Patient interviewed and appropriate assessments performed Referred patient to community resources care guide team for assistance with hazards at home: leaking roof Advised patient that he should receive a telephone call from the [place] team    Patient Goals/Self-Care Activities: Patient will self administer medications as prescribed Patient will attend church or other social activities Patient will continue to perform ADL's independently Patient will continue to perform IADL's independently Patient will call provider office for new concerns or questions Patient will keep all medical appointments Patient will check and record blood pressure  3 times per week and will call PCP with any readings outside of recommended range Patient will check and record blood sugar daily and will call PCP with any readings outside of recommended range Continue to eat iron rich foods Keep telephone appointment with Jackelyn Poling, RN Care Coordinator on 02/27/22 at 11:30  Plan: Telephone follow up appointment with care management team member scheduled for:  Care Coordination with Jackelyn Poling, RN Care Coordinator on 02/27/22 at 11:30  Chong Sicilian, BSN, RN-BC RN Care Coordinator Direct Dial: [phone]

## 2022-02-10 ENCOUNTER — Telehealth: Payer: Medicare HMO

## 2022-02-11 ENCOUNTER — Inpatient Hospital Stay: Payer: Medicare HMO

## 2022-02-11 ENCOUNTER — Inpatient Hospital Stay (HOSPITAL_BASED_OUTPATIENT_CLINIC_OR_DEPARTMENT_OTHER): Payer: Medicare HMO | Admitting: Hematology

## 2022-02-11 DIAGNOSIS — Z7984 Long term (current) use of oral hypoglycemic drugs: Secondary | ICD-10-CM | POA: Diagnosis not present

## 2022-02-11 DIAGNOSIS — D649 Anemia, unspecified: Secondary | ICD-10-CM | POA: Diagnosis not present

## 2022-02-11 DIAGNOSIS — C7951 Secondary malignant neoplasm of bone: Secondary | ICD-10-CM | POA: Diagnosis not present

## 2022-02-11 DIAGNOSIS — Z7982 Long term (current) use of aspirin: Secondary | ICD-10-CM | POA: Diagnosis not present

## 2022-02-11 DIAGNOSIS — Z79899 Other long term (current) drug therapy: Secondary | ICD-10-CM | POA: Diagnosis not present

## 2022-02-11 DIAGNOSIS — Z5111 Encounter for antineoplastic chemotherapy: Secondary | ICD-10-CM | POA: Diagnosis not present

## 2022-02-11 DIAGNOSIS — C61 Malignant neoplasm of prostate: Secondary | ICD-10-CM

## 2022-02-11 DIAGNOSIS — C775 Secondary and unspecified malignant neoplasm of intrapelvic lymph nodes: Secondary | ICD-10-CM | POA: Diagnosis not present

## 2022-02-11 DIAGNOSIS — R69 Illness, unspecified: Secondary | ICD-10-CM | POA: Diagnosis not present

## 2022-02-11 LAB — CBC WITH DIFFERENTIAL/PLATELET
Abs Immature Granulocytes: 0.06 10*3/uL (ref 0.00–0.07)
Basophils Absolute: 0.1 10*3/uL (ref 0.0–0.1)
Basophils Relative: 1 %
Eosinophils Absolute: 0.1 10*3/uL (ref 0.0–0.5)
Eosinophils Relative: 2 %
HCT: 29.9 % — ABNORMAL LOW (ref 39.0–52.0)
Hemoglobin: 9.8 g/dL — ABNORMAL LOW (ref 13.0–17.0)
Immature Granulocytes: 1 %
Lymphocytes Relative: 32 %
Lymphs Abs: 3 10*3/uL (ref 0.7–4.0)
MCH: 32.7 pg (ref 26.0–34.0)
MCHC: 32.8 g/dL (ref 30.0–36.0)
MCV: 99.7 fL (ref 80.0–100.0)
Monocytes Absolute: 0.7 10*3/uL (ref 0.1–1.0)
Monocytes Relative: 8 %
Neutro Abs: 5.5 10*3/uL (ref 1.7–7.7)
Neutrophils Relative %: 56 %
Platelets: 253 10*3/uL (ref 150–400)
RBC: 3 MIL/uL — ABNORMAL LOW (ref 4.22–5.81)
RDW: 15.1 % (ref 11.5–15.5)
WBC: 9.6 10*3/uL (ref 4.0–10.5)
nRBC: 0 % (ref 0.0–0.2)

## 2022-02-11 LAB — COMPREHENSIVE METABOLIC PANEL
ALT: 13 U/L (ref 0–44)
AST: 15 U/L (ref 15–41)
Albumin: 2.8 g/dL — ABNORMAL LOW (ref 3.5–5.0)
Alkaline Phosphatase: 58 U/L (ref 38–126)
Anion gap: 5 (ref 5–15)
BUN: 20 mg/dL (ref 8–23)
CO2: 21 mmol/L — ABNORMAL LOW (ref 22–32)
Calcium: 8.5 mg/dL — ABNORMAL LOW (ref 8.9–10.3)
Chloride: 112 mmol/L — ABNORMAL HIGH (ref 98–111)
Creatinine, Ser: 1.47 mg/dL — ABNORMAL HIGH (ref 0.61–1.24)
GFR, Estimated: 49 mL/min — ABNORMAL LOW (ref >60)
Glucose, Bld: 123 mg/dL — ABNORMAL HIGH (ref 70–99)
Potassium: 3.8 mmol/L (ref 3.5–5.1)
Sodium: 138 mmol/L (ref 135–145)
Total Bilirubin: 0.4 mg/dL (ref 0.3–1.2)
Total Protein: 6.8 g/dL (ref 6.5–8.1)

## 2022-02-11 LAB — PSA: Prostatic Specific Antigen: 14.12 ng/mL — ABNORMAL HIGH (ref 0.00–4.00)

## 2022-02-11 LAB — MAGNESIUM: Magnesium: 1.4 mg/dL — ABNORMAL LOW (ref 1.7–2.4)

## 2022-02-11 MED ORDER — SODIUM CHLORIDE 0.9% FLUSH
10.0000 mL | Freq: Once | INTRAVENOUS | Status: AC
  Administered 2022-02-11: 10 mL via INTRAVENOUS

## 2022-02-11 MED ORDER — HEPARIN SOD (PORK) LOCK FLUSH 100 UNIT/ML IV SOLN
500.0000 [IU] | Freq: Once | INTRAVENOUS | Status: AC
  Administered 2022-02-11: 500 [IU] via INTRAVENOUS

## 2022-02-11 NOTE — Patient Instructions (Addendum)
[place] at [facility] Discharge Instructions   You were seen and examined today by Dr. Delton Coombes.  He reviewed the results of your bone scan and CT scan which are stable.   We will get your blood work today and resume your treatment starting tomorrow.   Return as scheduled.    Thank you for choosing [place] at [facility] to provide your oncology and hematology care.  To afford each patient quality time with our provider, please arrive at least 15 minutes before your scheduled appointment time.   If you have a lab appointment with the [facility] please come in thru the Main Entrance and check in at the main information desk.  You need to re-schedule your appointment should you arrive 10 or more minutes late.  We strive to give you quality time with our providers, and arriving late affects you and other patients whose appointments are after yours.  Also, if you no show three or more times for appointments you may be dismissed from the clinic at the providers discretion.     Again, thank you for choosing [facility].  Our hope is that these requests will decrease the amount of time that you wait before being seen by our physicians.       _____________________________________________________________  Should you have questions after your visit to [facility], please contact our office at [phone] and follow the prompts.  Our office hours are 8:00 a.m. and 4:30 p.m. Monday - Friday.  Please note that voicemails left after 4:00 p.m. may not be returned until the following business day.  We are closed weekends and major holidays.  You do have access to a nurse 24-7, just call the main number to the clinic [phone] and do not press any options, hold on the line and a nurse will answer the phone.    For prescription refill requests, have your pharmacy contact our office and allow 72 hours.    Due to Covid,  you will need to wear a mask upon entering the hospital. If you do not have a mask, a mask will be given to you at the Main Entrance upon arrival. For doctor visits, patients may have 1 support person age 59 or older with them. For treatment visits, patients can not have anyone with them due to social distancing guidelines and our immunocompromised population.

## 2022-02-12 ENCOUNTER — Telehealth: Payer: Self-pay | Admitting: *Deleted

## 2022-02-12 ENCOUNTER — Inpatient Hospital Stay: Payer: Medicare HMO

## 2022-02-12 ENCOUNTER — Other Ambulatory Visit: Payer: Self-pay | Admitting: *Deleted

## 2022-02-12 ENCOUNTER — Ambulatory Visit: Payer: Medicare HMO | Admitting: Urology

## 2022-02-12 VITALS — BP 119/68 | HR 66 | Temp 96.4°F | Resp 17 | Wt 174.6 lb

## 2022-02-12 DIAGNOSIS — Z5111 Encounter for antineoplastic chemotherapy: Secondary | ICD-10-CM | POA: Diagnosis not present

## 2022-02-12 DIAGNOSIS — C61 Malignant neoplasm of prostate: Secondary | ICD-10-CM | POA: Diagnosis not present

## 2022-02-12 DIAGNOSIS — Z79899 Other long term (current) drug therapy: Secondary | ICD-10-CM | POA: Diagnosis not present

## 2022-02-12 DIAGNOSIS — Z7984 Long term (current) use of oral hypoglycemic drugs: Secondary | ICD-10-CM | POA: Diagnosis not present

## 2022-02-12 DIAGNOSIS — D649 Anemia, unspecified: Secondary | ICD-10-CM | POA: Diagnosis not present

## 2022-02-12 DIAGNOSIS — C7951 Secondary malignant neoplasm of bone: Secondary | ICD-10-CM | POA: Diagnosis not present

## 2022-02-12 DIAGNOSIS — R69 Illness, unspecified: Secondary | ICD-10-CM | POA: Diagnosis not present

## 2022-02-12 DIAGNOSIS — Z7982 Long term (current) use of aspirin: Secondary | ICD-10-CM | POA: Diagnosis not present

## 2022-02-12 DIAGNOSIS — Z95828 Presence of other vascular implants and grafts: Secondary | ICD-10-CM

## 2022-02-12 DIAGNOSIS — C775 Secondary and unspecified malignant neoplasm of intrapelvic lymph nodes: Secondary | ICD-10-CM | POA: Diagnosis not present

## 2022-02-12 MED ORDER — PALONOSETRON HCL INJECTION 0.25 MG/5ML
0.2500 mg | Freq: Once | INTRAVENOUS | Status: AC
  Administered 2022-02-12: 0.25 mg via INTRAVENOUS

## 2022-02-12 MED ORDER — HEPARIN SOD (PORK) LOCK FLUSH 100 UNIT/ML IV SOLN
500.0000 [IU] | Freq: Once | INTRAVENOUS | Status: AC | PRN
  Administered 2022-02-12: 500 [IU]

## 2022-02-12 MED ORDER — SODIUM CHLORIDE 0.9 % IV SOLN
50.0000 mg/m2 | Freq: Once | INTRAVENOUS | Status: AC
  Administered 2022-02-12: 100 mg via INTRAVENOUS
  Filled 2022-02-12: qty 10

## 2022-02-12 MED ORDER — SODIUM CHLORIDE 0.9 % IV SOLN: Freq: Once | INTRAVENOUS | Status: AC

## 2022-02-12 MED ORDER — SODIUM CHLORIDE 0.9% FLUSH
10.0000 mL | INTRAVENOUS | Status: DC | PRN
  Administered 2022-02-12: 10 mL

## 2022-02-12 MED ORDER — MAGNESIUM SULFATE 2 GM/50ML IV SOLN
2.0000 g | Freq: Once | INTRAVENOUS | Status: AC
  Administered 2022-02-12: 2 g via INTRAVENOUS

## 2022-02-12 MED ORDER — LOPERAMIDE HCL 2 MG PO CAPS: 2.0000 mg | ORAL_CAPSULE | ORAL | 0 refills | Status: DC | PRN

## 2022-02-12 MED ORDER — SODIUM CHLORIDE 0.9 % IV SOLN
10.0000 mg | Freq: Once | INTRAVENOUS | Status: AC
  Administered 2022-02-12: 10 mg via INTRAVENOUS
  Filled 2022-02-12: qty 10

## 2022-02-12 NOTE — Progress Notes (Signed)
Patient presents today for chemotherapy infusion.  Patient is in satisfactory condition with only complaints of diarrhea.  Vital signs are stable.  Labs were drawn on 02/11/22.  Labs reviewed.  Magnesium was 1.4.  All other labs are within treatment parameters.  Patient will receive Magnesium 2 grams x one dose today per Dr.  Delton Coombes.  We will proceed with treatment per MD orders.   Patient tolerated treatment well with no complaints voiced.  Patient left ambulatory with wife in stable condition.  Vital signs stable at discharge.  Follow up as scheduled.

## 2022-02-12 NOTE — Telephone Encounter (Signed)
Patient called stating that he has had 2-3 diarrheal stools for 3 days and does not feel like coming in for treatment today.  [facility] called in for diarrhea.  Patient will come in today for fluids an magnesium with possible treatment.

## 2022-02-12 NOTE — Patient Instructions (Signed)
[facility]  Discharge Instructions: Thank you for choosing [facility] to provide your oncology and hematology care.  If you have a lab appointment with the [facility], please come in thru the Main Entrance and check in at the main information desk.  Wear comfortable clothing and clothing appropriate for easy access to any Portacath or PICC line.   We strive to give you quality time with your provider. You may need to reschedule your appointment if you arrive late (15 or more minutes).  Arriving late affects you and other patients whose appointments are after yours.  Also, if you miss three or more appointments without notifying the office, you may be dismissed from the clinic at the provider's discretion.      For prescription refill requests, have your pharmacy contact our office and allow 72 hours for refills to be completed.    Today you received the following chemotherapy and/or immunotherapy agents Taxotere.   Magnesium Sulfate Injection What is this medication? MAGNESIUM SULFATE (mag NEE zee um SUL fate) prevents and treats low levels of magnesium in your body. It may also be used to prevent and treat seizures during pregnancy in people with high blood pressure disorders, such as preeclampsia or eclampsia. Magnesium plays an important role in maintaining the health of your muscles and nervous system. This medicine may be used for other purposes; ask your health care provider or pharmacist if you have questions. What should I tell my care team before I take this medication? They need to know if you have any of these conditions: Heart disease History of irregular heart beat Kidney disease An unusual or allergic reaction to magnesium sulfate, medications, foods, dyes, or preservatives Pregnant or trying to get pregnant Breast-feeding How should I use this medication? This medication is for infusion into a vein. It is given in a hospital or clinic  setting. Talk to your care team about the use of this medication in children. While this medication may be prescribed for selected conditions, precautions do apply. Overdosage: If you think you have taken too much of this medicine contact a poison control center or emergency room at once. NOTE: This medicine is only for you. Do not share this medicine with others. What if I miss a dose? This does not apply. What may interact with this medication? Certain medications for anxiety or sleep Certain medications for seizures like phenobarbital Digoxin Medications that relax muscles for surgery Narcotic medications for pain This list may not describe all possible interactions. Give your health care provider a list of all the medicines, herbs, non-prescription drugs, or dietary supplements you use. Also tell them if you smoke, drink alcohol, or use illegal drugs. Some items may interact with your medicine. What should I watch for while using this medication? Your condition will be monitored carefully while you are receiving this medication. You may need blood work done while you are receiving this medication. What side effects may I notice from receiving this medication? Side effects that you should report to your care team as soon as possible: Allergic reactions--skin rash, itching, hives, swelling of the face, lips, tongue, or throat High magnesium level--confusion, drowsiness, facial flushing, redness, sweating, muscle weakness, fast or irregular heartbeat, trouble breathing Low blood pressure--dizziness, feeling faint or lightheaded, blurry vision Side effects that usually do not require medical attention (report to your care team if they continue or are bothersome): Headache Nausea This list may not describe all possible side effects. Call  your doctor for medical advice about side effects. You may report side effects to FDA at 1-800-FDA-1088. Where should I keep my medication? This medication  is given in a hospital or clinic and will not be stored at home. NOTE: This sheet is a summary. It may not cover all possible information. If you have questions about this medicine, talk to your doctor, pharmacist, or health care provider.  2023 Elsevier/Gold Standard (2020-08-23 00:00:00)   Docetaxel Injection What is this medication? DOCETAXEL (doe se TAX el) treats some types of cancer. It works by slowing down the growth of cancer cells. This medicine may be used for other purposes; ask your health care provider or pharmacist if you have questions. COMMON BRAND NAME(S): Docefrez, Taxotere What should I tell my care team before I take this medication? They need to know if you have any of these conditions: Kidney disease Liver disease Low white blood cell levels Tingling of the fingers or toes or other nerve disorder An unusual or allergic reaction to docetaxel, polysorbate 80, other medications, foods, dyes, or preservatives Pregnant or trying to get pregnant Breast-feeding How should I use this medication? This medication is injected into a vein. It is given by your care team in a hospital or clinic setting. Talk to your care team about the use of this medication in children. Special care may be needed. Overdosage: If you think you have taken too much of this medicine contact a poison control center or emergency room at once. NOTE: This medicine is only for you. Do not share this medicine with others. What if I miss a dose? Keep appointments for follow-up doses. It is important not to miss your dose. Call your care team if you are unable to keep an appointment. What may interact with this medication? Do not take this medication with any of the following: Live virus vaccines This medication may also interact with the following: Certain antibiotics, such as clarithromycin, telithromycin Certain antivirals for HIV or hepatitis Certain medications for fungal infections, such as  itraconazole, ketoconazole, voriconazole Grapefruit juice Nefazodone Supplements, such as St. John's wort This list may not describe all possible interactions. Give your health care provider a list of all the medicines, herbs, non-prescription drugs, or dietary supplements you use. Also tell them if you smoke, drink alcohol, or use illegal drugs. Some items may interact with your medicine. What should I watch for while using this medication? This medication may make you feel generally unwell. This is not uncommon as chemotherapy can affect healthy cells as well as cancer cells. Report any side effects. Continue your course of treatment even though you feel ill unless your care team tells you to stop. You may need blood work done while you are taking this medication. This medication can cause serious side effects and infusion reactions. To reduce the risk, your care team may give you other medications to take before receiving this one. Be sure to follow the directions from your care team. This medication may increase your risk of getting an infection. Call your care team for advice if you get a fever, chills, sore throat, or other symptoms of a cold or flu. Do not treat yourself. Try to avoid being around people who are sick. Avoid taking medications that contain aspirin, acetaminophen, ibuprofen, naproxen, or ketoprofen unless instructed by your care team. These medications may hide a fever. Be careful brushing or flossing your teeth or using a toothpick because you may get an infection or bleed more easily.  If you have any dental work done, tell your dentist you are receiving this medication. Some products may contain alcohol. Ask your care team if this medication contains alcohol. Be sure to tell all care teams you are taking this medicine. Certain medications, like metronidazole and disulfiram, can cause an unpleasant reaction when taken with alcohol. The reaction includes flushing, headache, nausea,  vomiting, sweating, and increased thirst. The reaction can last from 30 minutes to several hours. This medication may affect your coordination, reaction time, or judgement. Do not drive or operate machinery until you know how this medication affects you. Sit up or stand slowly to reduce the risk of dizzy or fainting spells. Drinking alcohol with this medication can increase the risk of these side effects. Talk to your care team about your risk of cancer. You may be more at risk for certain types of cancer if you take this medication. Talk to your care team if you wish to become pregnant or think you might be pregnant. This medication can cause serious birth defects if taken during pregnancy or if you get pregnant within 2 months after stopping therapy. A negative pregnancy test is required before starting this medication. A reliable form of contraception is recommended while taking this medication and for 2 months after stopping it. Talk to your care team about reliable forms of contraception. Do not breast-feed while taking this medication and for 1 week after stopping therapy. Use a condom during sex and for 4 months after stopping therapy. Tell your care team right away if you think your partner might be pregnant. This medication can cause serious birth defects. This medication may cause infertility. Talk to your care team if you are concerned about your fertility. What side effects may I notice from receiving this medication? Side effects that you should report to your care team as soon as possible: Allergic reactions--skin rash, itching, hives, swelling of the face, lips, tongue, or throat Change in vision such as blurry vision, seeing halos around lights, vision loss Infection--fever, chills, cough, or sore throat Infusion reactions--chest pain, shortness of breath or trouble breathing, feeling faint or lightheaded Low red blood cell level--unusual weakness or fatigue, dizziness, headache, trouble  breathing Pain, tingling, or numbness in the hands or feet Painful swelling, warmth, or redness of the skin, blisters or sores at the infusion site Redness, blistering, peeling, or loosening of the skin, including inside the mouth Sudden or severe stomach pain, bloody diarrhea, fever, nausea, vomiting Swelling of the ankles, hands, or feet Tumor lysis syndrome (TLS)--nausea, vomiting, diarrhea, decrease in the amount of urine, dark urine, unusual weakness or fatigue, confusion, muscle pain or cramps, fast or irregular heartbeat, joint pain Unusual bruising or bleeding Side effects that usually do not require medical attention (report to your care team if they continue or are bothersome): Change in nail shape, thickness, or color Change in taste Hair loss Increased tears This list may not describe all possible side effects. Call your doctor for medical advice about side effects. You may report side effects to FDA at 1-800-FDA-1088. Where should I keep my medication? This medication is given in a hospital or clinic. It will not be stored at home. NOTE: This sheet is a summary. It may not cover all possible information. If you have questions about this medicine, talk to your doctor, pharmacist, or health care provider.  2023 Elsevier/Gold Standard (2021-08-16 00:00:00)        To help prevent nausea and vomiting after your treatment, we encourage  you to take your nausea medication as directed.  BELOW ARE SYMPTOMS THAT SHOULD BE REPORTED IMMEDIATELY: *FEVER GREATER THAN 100.4 F (38 C) OR HIGHER *CHILLS OR SWEATING *NAUSEA AND VOMITING THAT IS NOT CONTROLLED WITH YOUR NAUSEA MEDICATION *UNUSUAL SHORTNESS OF BREATH *UNUSUAL BRUISING OR BLEEDING *URINARY PROBLEMS (pain or burning when urinating, or frequent urination) *BOWEL PROBLEMS (unusual diarrhea, constipation, pain near the anus) TENDERNESS IN MOUTH AND THROAT WITH OR WITHOUT PRESENCE OF ULCERS (sore throat, sores in mouth, or a  toothache) UNUSUAL RASH, SWELLING OR PAIN  UNUSUAL VAGINAL DISCHARGE OR ITCHING   Items with * indicate a potential emergency and should be followed up as soon as possible or go to the Emergency Department if any problems should occur.  Please show the CHEMOTHERAPY ALERT CARD or IMMUNOTHERAPY ALERT CARD at check-in to the Emergency Department and triage nurse.  Should you have questions after your visit or need to cancel or reschedule your appointment, please contact [facility] [phone]  and follow the prompts.  Office hours are 8:00 a.m. to 4:30 p.m. Monday - Friday. Please note that voicemails left after 4:00 p.m. may not be returned until the following business day.  We are closed weekends and major holidays. You have access to a nurse at all times for urgent questions. Please call the main number to the clinic [phone] and follow the prompts.  For any non-urgent questions, you may also contact your provider using MyChart. We now offer e-Visits for anyone 73 and older to request care online for non-urgent symptoms. For details visit [email or web address].   Also download the MyChart app! Go to the app store, search "MyChart", open the app, select [facility], and log in with your MyChart username and password.  Masks are optional in the cancer centers. If you would like for your care team to wear a mask while they are taking care of you, please let them know. You may have one support person who is at least 76 years old accompany you for your appointments.

## 2022-02-14 ENCOUNTER — Encounter: Payer: Self-pay | Admitting: Hematology

## 2022-02-14 NOTE — Progress Notes (Signed)
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 Report date is 05/07/2021.  The Multi-Cancer + RNA Panel offered by [facility] includes sequencing and/or deletion/duplication analysis of the following 84 genes:  AIP*, ALK, APC*, ATM*, AXIN2*, BAP1*, BARD1*, BLM*, BMPR1A*, BRCA1*, BRCA2*, BRIP1*, CASR, CDC73*, CDH1*, CDK4, CDKN1B*, CDKN1C*, CDKN2A, CEBPA, CHEK2*, CTNNA1*, DICER1*, DIS3L2*, EGFR, EPCAM, FH*, FLCN*, GATA2*, GPC3, GREM1, HOXB13, HRAS, KIT, MAX*, MEN1*, MET, MITF, MLH1*, MSH2*, MSH3*, MSH6*, MUTYH*, NBN*, NF1*, NF2*, NTHL1*, PALB2*, PDGFRA, PHOX2B, PMS2*, POLD1*, POLE*, POT1*, PRKAR1A*, PTCH1*, PTEN*, RAD50*, RAD51C*, RAD51D*, RB1*, RECQL4, RET, RUNX1*, SDHA*, SDHAF2*, SDHB*, SDHC*, SDHD*, SMAD4*, SMARCA4*, SMARCB1*, SMARCE1*, STK11*, SUFU*, TERC, TERT, TMEM127*, Tp53*, TSC1*, TSC2*, VHL*, WRN*, and WT1.  RNA analysis is performed for * genes.   10/21/2021 -  Chemotherapy   Patient is on Treatment Plan : PROSTATE Docetaxel + Prednisone q21d       CANCER STAGING:  Cancer Staging  Prostate cancer [facility]) Staging form: Prostate, [name] 7th Edition - Clinical stage from 04/02/2021: rT2b, N1, PSA: 10 to 19, Gleason 7 - Unsigned   INTERVAL  HISTORY:  [name], a 76 y.o. male, returns for follow-up and toxicity assessment prior to next cycle of chemotherapy for metastatic castration resistant prostate cancer.  He does not report any tingling or numbness in the extremities.  No major GI symptoms reported.  He lost about 1 pound since last cycle.  REVIEW OF SYSTEMS:  Review of Systems  Constitutional:  Positive for unexpected weight change (-1 lbs). Negative for appetite change and fatigue.  Cardiovascular:  Negative for leg swelling.  Gastrointestinal:  Negative for diarrhea.  All other systems reviewed and are negative.   PAST MEDICAL/SURGICAL HISTORY:  Past Medical History:  Diagnosis Date   AKI (acute kidney injury) ([place]) 10/04/2021   Anemia    BPH (benign prostatic hyperplasia)    Sees Dr [name]   Cataract    Diabetes mellitus without complication [facility])    Educated about COVID-19 virus infection 07/18/2019   Genetic testing 05/09/2021   [facility] Multi-Cancer Panel was Negative. Report date is 05/07/2021.  The Multi-Cancer + RNA Panel offered by [facility] includes sequencing and/or deletion/duplication analysis of the following 84 genes:  AIP*, ALK, APC*, ATM*, AXIN2*, BAP1*, BARD1*, BLM*, BMPR1A*, BRCA1*, BRCA2*, BRIP1*, CASR, CDC73*, CDH1*, CDK4, CDKN1B*, CDKN1C*, CDKN2A, CEBPA, CHEK2*, CTNNA1*, DICER1*, DIS3L2*, EGFR, EPCAM, FH*, F   Hyperlipidemia    Hypertension    Low serum vitamin D    Port-A-Cath in place 10/09/2021   Prostate cancer [facility]) 2008   w/seed implantation and radiation   Past Surgical History:  Procedure Laterality Date   COLONOSCOPY  2015,2018   PORTACATH PLACEMENT Right 10/17/2021   Procedure: INSERTION PORT-A-CATH, [name];  Surgeon: [name], DO;  Location: [name];  Service:  General;  Laterality: Right;  pt needs to have glucose checked AM of surgery   PROSTATE SURGERY  2008   seed implant    SOCIAL HISTORY:  Social History   Socioeconomic History   Marital status: Married     Spouse name: [place]   Number of children: 2   Years of education: 12   Highest education level: 12th grade  Occupational History   Occupation: [name]    Comment: Retired Geologist, engineering  Tobacco Use   Smoking status: Every Day    Packs/day: 1.00    Years: 55.00    Total pack years: 55.00    Types: Cigarettes    Start date: 06/30/1965   Smokeless tobacco: Never   Tobacco comments:    Has tried nicotine replacement gum  Vaping Use   Vaping Use: Never used  Substance and Sexual Activity   Alcohol use: No   Drug use: No   Sexual activity: Yes  Other Topics Concern   Not on file  Social History Narrative   Retired, lives at home with wife [name], two grown children, enjoys gardening     Social Determinants of Health   Financial Resource Strain: Low Risk  (01/27/2022)   Overall Financial Resource Strain (CARDIA)    Difficulty of Paying Living Expenses: Not hard at all  Food Insecurity: No Food Insecurity (01/27/2022)   Hunger Vital Sign    Worried About Running Out of Food in the Last Year: Never true    [place] in the Last Year: Never true  Transportation Needs: No Transportation Needs (01/27/2022)   PRAPARE - Hydrologist (Medical): No    Lack of Transportation (Non-Medical): No  Physical Activity: Inactive (01/27/2022)   Exercise Vital Sign    Days of Exercise per Week: 0 days    Minutes of Exercise per Session: 0 min  Stress: No Stress Concern Present (01/27/2022)   [place]    Feeling of Stress : Not at all  Social Connections: Moderately Integrated (01/27/2022)   Social Connection and Isolation Panel [NHANES]    Frequency of Communication with Friends and Family: More than three times a week    Frequency of Social Gatherings with Friends and Family: Once a week    Attends Religious Services: More than 4 times per year    Active Member of [employer] or Organizations: No     Attends Archivist Meetings: Never    Marital Status: Married  Human resources officer Violence: Not At Risk (01/27/2022)   Humiliation, Afraid, Rape, and Kick questionnaire    Fear of Current or Ex-Partner: No    Emotionally Abused: No    Physically Abused: No    Sexually Abused: No    FAMILY HISTORY:  Family History  Problem Relation Age of Onset   Diabetes Mother    Heart disease Mother    Kidney disease Mother        DIALYSIS   Emphysema Father    Deep vein thrombosis Sister    Kidney disease Sister    Lung cancer Sister    Colon polyps Brother    Hypertension Brother    Gout Brother    Colon cancer Neg Hx    Esophageal cancer Neg Hx    Rectal cancer Neg Hx    Prostate cancer Neg Hx     CURRENT MEDICATIONS:  Current Outpatient Medications  Medication Sig Dispense Refill  alfuzosin (UROXATRAL) 10 MG 24 hr tablet Take 1 tablet (10 mg total) by mouth at bedtime. 90 tablet 3   ammonium lactate (AMLACTIN) 12 % cream      aspirin 81 MG tablet Take 1 tablet (81 mg total) by mouth daily with breakfast. 30 tablet 3   atorvastatin (LIPITOR) 40 MG tablet Take 1 tablet (40 mg total) by mouth daily. (Patient taking differently: Take 40 mg by mouth at bedtime.) 90 tablet 3   azelastine (ASTELIN) 0.1 % nasal spray Place 1 spray into both nostrils 2 (two) times daily as needed for allergies.     BOOSTRIX 5-2.5-18.5 LF-MCG/0.5 injection      cholecalciferol (VITAMIN D) 1000 UNITS tablet Take 1,000 Units by mouth daily.      Dapagliflozin-sAXagliptin (QTERN) 10-5 MG TABS Take 1 tablet by mouth in the morning. 90 tablet 5   DOCEtaxel (TAXOTERE IV) Inject into the vein every 21 ( twenty-one) days.     docusate sodium (COLACE) 100 MG capsule Take 1 capsule (100 mg total) by mouth 2 (two) times daily. 60 capsule 2   dronabinol (MARINOL) 5 MG capsule Take 1 capsule (5 mg total) by mouth 2 (two) times daily before a meal. 60 capsule 3   escitalopram (LEXAPRO) 10 MG tablet Take 1 tablet  (10 mg total) by mouth daily. 90 tablet 1   FARXIGA 10 MG TABS tablet Take 10 mg by mouth daily.     glimepiride (AMARYL) 2 MG tablet Take 2 mg by mouth 2 (two) times daily.     glucose blood test strip 1 each by Other route as needed for other. Use as instructed to test blood sugar daily.  One Touch Verio Test Strips DX: E11.65 100 each 11   lidocaine-prilocaine (EMLA) cream Apply a small amount to port a cath site and cover with plastic wrap 1 hour prior to chemotherapy appointments 30 g 3   lisinopril-hydrochlorothiazide (ZESTORETIC) 20-25 MG tablet Take 1 tablet by mouth daily. 90 tablet 3   magnesium oxide (MAG-OX) 400 (240 Mg) MG tablet Take 1 tablet (400 mg total) by mouth 2 (two) times daily. 60 tablet 2   megestrol (MEGACE) 400 MG/10ML suspension Take 10 mLs (400 mg total) by mouth 2 (two) times daily. 480 mL 4   metFORMIN (GLUCOPHAGE) 1000 MG tablet Take 1 tablet (1,000 mg total) by mouth 2 (two) times daily with a meal. 180 tablet 2   oxyCODONE (ROXICODONE) 5 MG immediate release tablet Take 1 tablet (5 mg total) by mouth every 8 (eight) hours as needed. 10 tablet 0   potassium chloride SA (KLOR-CON M) 20 MEQ tablet Take 1 tablet (20 mEq total) by mouth daily. 30 tablet 6   pramoxine (PROCTOFOAM) 1 % foam Place 1 application. rectally 3 (three) times daily as needed for anal itching. 15 g 0   prochlorperazine (COMPAZINE) 10 MG tablet Take 1 tablet (10 mg total) by mouth every 6 (six) hours as needed (Nausea or vomiting). 30 tablet 2   loperamide (IMODIUM) 2 MG capsule Take 1 capsule (2 mg total) by mouth as needed for diarrhea or loose stools. 30 capsule 0   No current facility-administered medications for this visit.   Facility-Administered Medications Ordered in Other Visits  Medication Dose Route Frequency Provider Last Rate Last Admin   0.9 %  sodium chloride infusion   Intravenous Continuous Derek Jack, MD 20 mL/hr at 01/08/22 1513 Infusion Verify at 01/08/22 1513    sodium chloride flush (NS) 0.9 % injection  10 mL  10 mL Intracatheter PRN Derek Jack, MD   10 mL at 01/08/22 1310    ALLERGIES:  No Known Allergies  PHYSICAL EXAM:  Performance status (ECOG): 1 - Symptomatic but completely ambulatory  Vitals:   02/11/22 0933  BP: 131/80  Pulse: 73  Resp: 18  Temp: (!) 97.3 F (36.3 C)  SpO2: 99%   Wt Readings from Last 3 Encounters:  02/12/22 174 lb 9.7 oz (79.2 kg)  02/11/22 175 lb 11.2 oz (79.7 kg)  01/27/22 176 lb (79.8 kg)   Physical Exam Vitals reviewed.  Constitutional:      Appearance: Normal appearance.  Cardiovascular:     Rate and Rhythm: Normal rate and regular rhythm.     Pulses: Normal pulses.     Heart sounds: Normal heart sounds.  Pulmonary:     Effort: Pulmonary effort is normal.     Breath sounds: Normal breath sounds.  Musculoskeletal:     Right lower leg: No edema.     Left lower leg: No edema.  Neurological:     General: No focal deficit present.     Mental Status: He is alert and oriented to person, place, and time.  Psychiatric:        Mood and Affect: Mood normal.        Behavior: Behavior normal.     LABORATORY DATA:  I have reviewed the labs as listed.     Latest Ref Rng & Units 02/11/2022   10:19 AM 01/29/2022    8:33 AM 01/08/2022    8:18 AM  CBC  WBC 4.0 - 10.5 K/uL 9.6  10.1  6.7   Hemoglobin 13.0 - 17.0 g/dL 9.8  9.1  9.9   Hematocrit 39.0 - 52.0 % 29.9  27.8  30.0   Platelets 150 - 400 K/uL 253  410  164       Latest Ref Rng & Units 02/11/2022   10:19 AM 01/29/2022    8:33 AM 01/08/2022    8:18 AM  CMP  Glucose 70 - 99 mg/dL 123  103  133   BUN 8 - 23 mg/dL '20  21  30   ' Creatinine 0.61 - 1.24 mg/dL 1.47  1.32  1.52   Sodium 135 - 145 mmol/L 138  135  137   Potassium 3.5 - 5.1 mmol/L 3.8  3.4  3.4   Chloride 98 - 111 mmol/L 112  107  112   CO2 22 - 32 mmol/L '21  19  18   ' Calcium 8.9 - 10.3 mg/dL 8.5  8.3  8.3   Total Protein 6.5 - 8.1 g/dL 6.8  6.7  6.5   Total Bilirubin 0.3 -  1.2 mg/dL 0.4  0.6  0.5   Alkaline Phos 38 - 126 U/L 58  53  55   AST 15 - 41 U/L '15  18  16   ' ALT 0 - 44 U/L '13  22  15     ' DIAGNOSTIC IMAGING:  I have independently reviewed the scans and discussed with the patient. NM Bone Scan Whole Body  Result Date: 02/04/2022 CLINICAL DATA:  Metastatic prostate cancer, ongoing chemotherapy, assess treatment response EXAM: NUCLEAR MEDICINE WHOLE BODY BONE SCAN TECHNIQUE: Whole body anterior and posterior images were obtained approximately 3 hours after intravenous injection of radiopharmaceutical. RADIOPHARMACEUTICALS:  20.2 mCi Technetium-30mMDP IV COMPARISON:  10/10/2021 Correlation: CT abdomen and pelvis 01/02/2022 FINDINGS: Subtle uptake identified at the anterior RIGHT sixth, lateral RIGHT eighth, and questionably  lateral RIGHT tenth ribs unchanged. No other sites of abnormal osseous tracer accumulation identified. Expected urinary tract and soft tissue distribution of tracer. IMPRESSION: Subtle uptake in RIGHT ribs unchanged. No new scintigraphic abnormalities. Electronically Signed   By: Lavonia Dana M.D.   On: 02/04/2022 17:07     ASSESSMENT:  Metastatic castration refractory prostate cancer to left iliac lymph node: - History of prostate adenocarcinoma, T2b, Gleason 7, PSA 16.6 - Treated with neoadjuvant hormonal therapy, EBRT followed by brachytherapy with iodine 125 seeds in 2010 - Rising PSA levels in February 2019 - Bone scan on 08/10/2017 negative.  CTAP on 08/26/2017 shows enlarged left pelvic sidewall lymph node 2.2 cm. - Lupron initiated on 08/10/2019. - CTAP on 03/09/2020 with left internal/external iliac lymph node measuring 2.1 x 2.8 cm (2.1 x 2.2 cm), craniocaudal 4.1 cm, previously 3.4 cm. - Enzalutamide to 160 mg daily started on 04/17/2020.  Discontinued end of October 2022. - PSA on 02/20/2021 (5.2), 07/11/2020 (2.0), 02/08/2020 (3.1) - CTAP on 03/07/2021 with left iliac bifurcation lymph node measuring 3.5 x 2.6 cm (previously 2.8 x 2.0  cm) - Bone scan on 03/07/2021 negative. - Left pelvic lymph node biopsy on 04/08/2021, metastatic prostate cancer. - We have reviewed PSMA PET scan results which showed enlarged left iliac lymph node 33 mm with SUV 285.  1 mm lymph node and 4 mm node between IVC and aorta.  Activity in the right acetabulum, left iliac bone, left transverse process of T11 vertebral body, spine of the right scapula. - Guardant 360 did not show any targetable mutations.  Did not show MSI high. - NGS testing could not be done because of less than 100 tumor cells. - Abiraterone and prednisone from 07/15/2021 through 10/01/2021 with progression. - Germline mutation [facility] testing was negative. - Cycle 1 of docetaxel on 10/21/2021.   PLAN:  Metastatic castration refractory prostate cancer to the left iliac lymph node: - CT CAP on 01/02/2022 showed unchanged bulky lymph nodes in the left iliac bifurcation and no new findings. - Reviewed bone scan from 02/04/2022 which did not show any new changes.  Subtle uptake in the right ribs unchanged.  Reviewed labs today which showed creatinine is stable at 1.47 and normal LFTs.  CBC was grossly normal.  He will proceed with cycle 4 of docetaxel today.  As he is losing weight, we will switch his treatment to every 4 to 5 weeks.  We will follow-up on PSA from today.  RTC 5 weeks for follow-up.  2.  Weight loss: - He lost 9 pounds after cycle 2 and 13 pounds after cycle 1.  He lost about 1 pound after cycle 3.  Continue nutritional supplements.  3.  Normocytic anemia: - He completed 3 doses of Venofer.  Hemoglobin today is 9.8.  4.  Bone metastasis: - Denosumab will be started after dental evaluation.  5.  Hypomagnesemia: - Magnesium today is low.  He will receive IV magnesium.  Continue magnesium twice daily at home.   Orders placed this encounter:  No orders of the defined types were placed in this encounter.    Derek Jack, MD [place] [phone]   I, Thana Ates, am acting as a scribe for Dr. Derek Jack.  I, Derek Jack MD, have reviewed the above documentation for accuracy and completeness, and I agree with the above.

## 2022-02-17 ENCOUNTER — Telehealth (HOSPITAL_COMMUNITY): Payer: Self-pay | Admitting: Dietician

## 2022-02-17 NOTE — Telephone Encounter (Signed)
Attempted to contact patient via telephone for nutrition follow-up. Patient did not answer. Left VM with request for return call. Contact information provided.  

## 2022-02-18 ENCOUNTER — Other Ambulatory Visit: Payer: Medicare HMO

## 2022-02-18 DIAGNOSIS — E1165 Type 2 diabetes mellitus with hyperglycemia: Secondary | ICD-10-CM | POA: Diagnosis not present

## 2022-02-24 ENCOUNTER — Encounter: Payer: Medicare HMO | Admitting: Dietician

## 2022-02-24 ENCOUNTER — Telehealth (HOSPITAL_COMMUNITY): Payer: Self-pay | Admitting: Dietician

## 2022-02-24 NOTE — Telephone Encounter (Signed)
Nutrition Follow-up:  Patient with prostate cancer metastatic to intrapelvic lymph node. He is currently receiving docetaxel + prednisone q21d (started 4/10).  Called patient for nutrition follow-up. Patient says he is not sure if he is doing too good and thinks he might need a doctor. Patient reports he has fallen a couple of times in the last week and is "half eating"  His last fall was yesterday as he was coming out of the bathroom. Patient denies hitting his head but his hand hit the coffee table. He denies pain. Patient says he just feels weak.    Medications: reviewed   Labs: 8/15 - glucose 123, Cr 1.47  Anthropometrics: Last weight 174 lb 9.7 oz on 8/16  7/12 - 176 lb 6.4 oz  6/12 - 176 lb 8 oz  5/15 - 185 lb 10 oz    NUTRITION DIAGNOSIS: Severe malnutrition appears to continue    INTERVENTION:  Call transferred to LPN for further assessment - Patient has appointment with PCP tomorrow (8/29). Pt advised to present to emergency department for evaluation if symptoms persist/worsen per LPN  MONITORING, EVALUATION, GOAL: weight trends, intake    NEXT VISIT: To be scheduled

## 2022-02-25 ENCOUNTER — Ambulatory Visit: Payer: Medicare HMO | Admitting: Urology

## 2022-02-25 ENCOUNTER — Emergency Department (HOSPITAL_COMMUNITY)
Admission: EM | Admit: 2022-02-25 | Discharge: 2022-02-25 | Disposition: A | Discharge status: Home / Self Care | Payer: Medicare HMO | Attending: Emergency Medicine | Admitting: Emergency Medicine

## 2022-02-25 ENCOUNTER — Encounter (HOSPITAL_COMMUNITY): Payer: Self-pay | Admitting: Emergency Medicine

## 2022-02-25 ENCOUNTER — Other Ambulatory Visit: Payer: Self-pay

## 2022-02-25 ENCOUNTER — Emergency Department (HOSPITAL_COMMUNITY): Payer: Medicare HMO

## 2022-02-25 DIAGNOSIS — I10 Essential (primary) hypertension: Secondary | ICD-10-CM | POA: Diagnosis not present

## 2022-02-25 DIAGNOSIS — Z7982 Long term (current) use of aspirin: Secondary | ICD-10-CM | POA: Diagnosis not present

## 2022-02-25 DIAGNOSIS — E119 Type 2 diabetes mellitus without complications: Secondary | ICD-10-CM | POA: Diagnosis not present

## 2022-02-25 DIAGNOSIS — Z79899 Other long term (current) drug therapy: Secondary | ICD-10-CM | POA: Insufficient documentation

## 2022-02-25 DIAGNOSIS — Z7984 Long term (current) use of oral hypoglycemic drugs: Secondary | ICD-10-CM | POA: Insufficient documentation

## 2022-02-25 DIAGNOSIS — E876 Hypokalemia: Secondary | ICD-10-CM | POA: Insufficient documentation

## 2022-02-25 DIAGNOSIS — M6281 Muscle weakness (generalized): Secondary | ICD-10-CM | POA: Diagnosis not present

## 2022-02-25 DIAGNOSIS — C61 Malignant neoplasm of prostate: Secondary | ICD-10-CM | POA: Diagnosis not present

## 2022-02-25 DIAGNOSIS — R531 Weakness: Secondary | ICD-10-CM | POA: Diagnosis not present

## 2022-02-25 LAB — BASIC METABOLIC PANEL
Anion gap: 9 (ref 5–15)
BUN: 26 mg/dL — ABNORMAL HIGH (ref 8–23)
CO2: 19 mmol/L — ABNORMAL LOW (ref 22–32)
Calcium: 8.5 mg/dL — ABNORMAL LOW (ref 8.9–10.3)
Chloride: 109 mmol/L (ref 98–111)
Creatinine, Ser: 1.72 mg/dL — ABNORMAL HIGH (ref 0.61–1.24)
GFR, Estimated: 41 mL/min — ABNORMAL LOW (ref >60)
Glucose, Bld: 118 mg/dL — ABNORMAL HIGH (ref 70–99)
Potassium: 3.3 mmol/L — ABNORMAL LOW (ref 3.5–5.1)
Sodium: 137 mmol/L (ref 135–145)

## 2022-02-25 LAB — CBC
HCT: 33.8 % — ABNORMAL LOW (ref 39.0–52.0)
Hemoglobin: 10.8 g/dL — ABNORMAL LOW (ref 13.0–17.0)
MCH: 32.6 pg (ref 26.0–34.0)
MCHC: 32 g/dL (ref 30.0–36.0)
MCV: 102.1 fL — ABNORMAL HIGH (ref 80.0–100.0)
Platelets: 226 10*3/uL (ref 150–400)
RBC: 3.31 MIL/uL — ABNORMAL LOW (ref 4.22–5.81)
RDW: 14.4 % (ref 11.5–15.5)
WBC: 6.6 10*3/uL (ref 4.0–10.5)
nRBC: 0 % (ref 0.0–0.2)

## 2022-02-25 LAB — MAGNESIUM: Magnesium: 1.5 mg/dL — ABNORMAL LOW (ref 1.7–2.4)

## 2022-02-25 LAB — CBG MONITORING, ED: Glucose-Capillary: 101 mg/dL — ABNORMAL HIGH (ref 70–99)

## 2022-02-25 MED ORDER — MAGNESIUM SULFATE 2 GM/50ML IV SOLN
2.0000 g | Freq: Once | INTRAVENOUS | Status: AC
  Administered 2022-02-25: 2 g via INTRAVENOUS
  Filled 2022-02-25: qty 50

## 2022-02-25 MED ORDER — SODIUM CHLORIDE 0.9 % IV BOLUS
1000.0000 mL | Freq: Once | INTRAVENOUS | Status: AC
  Administered 2022-02-25: 1000 mL via INTRAVENOUS

## 2022-02-25 MED ORDER — POTASSIUM CHLORIDE 10 MEQ/100ML IV SOLN
10.0000 meq | Freq: Once | INTRAVENOUS | Status: AC
  Administered 2022-02-25: 10 meq via INTRAVENOUS
  Filled 2022-02-25: qty 100

## 2022-02-25 NOTE — ED Provider Notes (Signed)
[facility] EMERGENCY DEPARTMENT Provider Note   CSN: [id number] Arrival date & time: 02/25/22  1111     History  Chief Complaint  Patient presents with   Weakness    Andrew [name] is a 76 y.o. male with a history of type 2 DM, HTN and prostate cancer undergoing chemotherapy presenting with generalized weakness and fatigue.  His last chemo tx was 3 weeks ago, last tx was postponed secondary to  low magnesium  levels. He denies fevers, chills, chest pain, sob, n/v or abdominal pain.  Wife and dg at bedside reports he fell 3 days ago secondary to weakness.  He denies injury from this fall.   The history is provided by the patient.       Home Medications Prior to Admission medications   Medication Sig Start Date End Date Taking? Authorizing Provider  acetaminophen (TYLENOL) 650 MG CR tablet Take 1,300 mg by mouth every 8 (eight) hours as needed for pain.   Yes [provider]  aspirin 81 MG tablet Take 1 tablet (81 mg total) by mouth daily with breakfast. 10/18/21  Yes Pappayliou, Barnetta Chapel A, DO  atorvastatin (LIPITOR) 40 MG tablet Take 1 tablet (40 mg total) by mouth daily. Patient taking differently: Take 40 mg by mouth at bedtime. 10/02/21  Yes Stacks, Cletus Gash, MD  cholecalciferol (VITAMIN D) 1000 UNITS tablet Take 1,000 Units by mouth daily.    Yes [provider]  Dapagliflozin-sAXagliptin (QTERN) 10-5 MG TABS Take 1 tablet by mouth in the morning. 10/29/21  Yes Stacks, Cletus Gash, MD  DOCEtaxel (TAXOTERE IV) Inject into the vein every 21 ( twenty-one) days. 10/14/21  Yes [provider]  docusate sodium (COLACE) 100 MG capsule Take 1 capsule (100 mg total) by mouth 2 (two) times daily. Patient taking differently: Take 100 mg by mouth daily as needed for mild constipation. 10/17/21 10/17/22 Yes Pappayliou, Flint Melter, DO  dronabinol (MARINOL) 5 MG capsule Take 1 capsule (5 mg total) by mouth 2 (two) times daily before a meal. 12/02/21  Yes Derek Jack, MD   FARXIGA 10 MG TABS tablet Take 10 mg by mouth daily. 11/07/21  Yes [provider]  glimepiride (AMARYL) 2 MG tablet Take 2 mg by mouth 2 (two) times daily. 12/31/21  Yes [provider]  lisinopril-hydrochlorothiazide (ZESTORETIC) 20-25 MG tablet Take 1 tablet by mouth daily. 10/02/21  Yes [name], MD  loperamide (IMODIUM) 2 MG capsule Take 1 capsule (2 mg total) by mouth as needed for diarrhea or loose stools. 02/12/22  Yes Derek Jack, MD  magnesium oxide (MAG-OX) 400 (240 Mg) MG tablet Take 1 tablet (400 mg total) by mouth 2 (two) times daily. 12/02/21  Yes Derek Jack, MD  megestrol (MEGACE) 400 MG/10ML suspension Take 10 mLs (400 mg total) by mouth 2 (two) times daily. 12/23/21  Yes Derek Jack, MD  metFORMIN (GLUCOPHAGE) 1000 MG tablet Take 1 tablet (1,000 mg total) by mouth 2 (two) times daily with a meal. 10/05/21  Yes Emokpae, Courage, MD  prochlorperazine (COMPAZINE) 10 MG tablet Take 1 tablet (10 mg total) by mouth every 6 (six) hours as needed (Nausea or vomiting). 10/10/21 02/26/22 Yes Derek Jack, MD  alfuzosin (UROXATRAL) 10 MG 24 hr tablet Take 1 tablet (10 mg total) by mouth at bedtime. Patient not taking: Reported on 02/25/2022 08/21/21   Cleon Gustin, MD  ammonium lactate (AMLACTIN) 12 % cream  11/19/21   [provider]  [place] 5-2.5-18.5 LF-MCG/0.5 injection  08/05/21   [provider]  escitalopram (LEXAPRO) 10 MG tablet Take 1 tablet (10 mg total) by mouth daily. Patient not taking: Reported on 02/25/2022 10/29/21   [name], MD  glucose blood test strip 1 each by Other route as needed for other. Use as instructed to test blood sugar daily.  One Touch Verio Test Strips DX: E11.65 10/13/19   [name], MD  oxyCODONE (ROXICODONE) 5 MG immediate release tablet Take 1 tablet (5 mg total) by mouth every 8 (eight) hours as needed. Patient not taking: Reported on 02/25/2022 10/17/21   Pappayliou, Barnetta Chapel  A, DO  potassium chloride SA (KLOR-CON M) 20 MEQ tablet Take 1 tablet (20 mEq total) by mouth daily. Patient not taking: Reported on 02/25/2022 08/26/21   Derek Jack, MD  pramoxine (PROCTOFOAM) 1 % foam Place 1 application. rectally 3 (three) times daily as needed for anal itching. Patient not taking: Reported on 02/25/2022 12/02/21   Derek Jack, MD      Allergies    Patient has no known allergies.    Review of Systems   Review of Systems  Constitutional:  Positive for fatigue. Negative for chills and fever.  HENT:  Negative for congestion.   Eyes: Negative.   Respiratory:  Negative for chest tightness and shortness of breath.   Cardiovascular:  Negative for chest pain, palpitations and leg swelling.  Gastrointestinal:  Negative for abdominal pain, nausea and vomiting.  Genitourinary: Negative.  Negative for dysuria.  Musculoskeletal:  Negative for arthralgias, joint swelling and neck pain.  Skin: Negative.  Negative for rash and wound.  Neurological:  Positive for weakness. Negative for dizziness, light-headedness, numbness and headaches.  Psychiatric/Behavioral: Negative.      Physical Exam Updated Vital Signs BP 133/86 (BP Location: Right Arm)   Pulse 67   Temp 97.8 F (36.6 C) (Oral)   Resp 18   Ht '5\' 8"'$  (1.727 m)   Wt 78 kg   SpO2 98%   BMI 26.15 kg/m  Physical Exam Vitals and nursing note reviewed.  Constitutional:      Appearance: He is well-developed.  HENT:     Head: Normocephalic and atraumatic.  Eyes:     Conjunctiva/sclera: Conjunctivae normal.  Cardiovascular:     Rate and Rhythm: Normal rate and regular rhythm.     Heart sounds: Normal heart sounds.  Pulmonary:     Effort: Pulmonary effort is normal.     Breath sounds: Normal breath sounds. No wheezing.  Abdominal:     General: Bowel sounds are normal.     Palpations: Abdomen is soft.     Tenderness: There is no abdominal tenderness.  Musculoskeletal:        General: No swelling or  tenderness. Normal range of motion.     Cervical back: Normal range of motion.     Comments: Moves all extremities without complaint of pain.  Skin:    General: Skin is warm and dry.  Neurological:     Mental Status: He is alert.     ED Results / Procedures / Treatments   Labs (all labs ordered are listed, but only abnormal results are displayed) Labs Reviewed  BASIC METABOLIC PANEL - Abnormal; Notable for the following components:      Result Value   Potassium 3.3 (*)    CO2 19 (*)    Glucose, Bld 118 (*)    BUN 26 (*)    Creatinine, Ser 1.72 (*)    Calcium 8.5 (*)    GFR, Estimated 41 (*)  All other components within normal limits  CBC - Abnormal; Notable for the following components:   RBC 3.31 (*)    Hemoglobin 10.8 (*)    HCT 33.8 (*)    MCV 102.1 (*)    All other components within normal limits  MAGNESIUM - Abnormal; Notable for the following components:   Magnesium 1.5 (*)    All other components within normal limits  CBG MONITORING, ED - Abnormal; Notable for the following components:   Glucose-Capillary 101 (*)    All other components within normal limits    EKG None  Radiology No results found.  Procedures Procedures    Medications Ordered in ED Medications  sodium chloride 0.9 % bolus 1,000 mL (0 mLs Intravenous Stopped 02/25/22 1747)  magnesium sulfate IVPB 2 g 50 mL (0 g Intravenous Stopped 02/25/22 1747)  potassium chloride 10 mEq in 100 mL IVPB (0 mEq Intravenous Stopped 02/25/22 1747)    ED Course/ Medical Decision Making/ A&P Clinical Course as of 02/28/22 2234  Fri Feb 28, 2022  2227 Magnesium(!): 1.5 [JI]    Clinical Course User Index [JI] Evalee Jefferson, PA-C                           Medical Decision Making Pt with generalized weakness and fatigue,  Prostate ca, last chemo 3 weeks ago, having problems with low magnesium and potassium.  Denies cp, sob, n/v/ abdominal pain, headache, does endorse reduced appetite. Afebrile, no hx of  fever.  Ddx including chemo induced infection/ anemia, electrolyte disturbance, anemia, dehydration.    Pt was given IVF, also replaced potasium and magnesium.  Pt ambulated in dept without need of assistance.  Felt stable for dc home with close f/u with pcp and/or oncology.  Amount and/or Complexity of Data Reviewed Labs: ordered.    Details: Significant for hypomagnesemia and hypokalemia.  Pt does have anemia with hgb 10.8 but better than when checked 3 weeks ago. Pt had urinated here, unfortunately was not collected.  Has no sx of dysuria, doubt this as source of sx.  Radiology: ordered.           Final Clinical Impression(s) / ED Diagnoses Final diagnoses:  Hypokalemia  Hypomagnesemia    Rx / DC Orders ED Discharge Orders     None         Landis Martins 02/28/22 2234    Sherwood Gambler, MD 02/28/22 2303

## 2022-02-25 NOTE — Discharge Instructions (Signed)
I encourage you to start taking your magnesium supplement, 1 tablet daily.  Your potassium was also borderline low today and has been replenished with IV potassium.  Concentrate on foods that are rich in potassium including bananas, orange juice, leafy green vegetables.  Make sure you are drinking plenty of fluids.  Use your walker if needed at home.  Plan to follow-up with your primary doctor within a week's time if you are not feeling better or your symptoms are in any way worsening.  You may also return here if your symptoms get significantly worse.

## 2022-02-25 NOTE — ED Triage Notes (Signed)
Pt c/o gen weaknes x 3 days. Takes chemo for prostate cancer, no tx in 3 weeks. A/o. Ambulatory. Falling at home per wife. Pt denies pain. Denies gu/gi changes.

## 2022-02-26 ENCOUNTER — Other Ambulatory Visit: Payer: Self-pay | Admitting: Hematology

## 2022-02-26 DIAGNOSIS — C61 Malignant neoplasm of prostate: Secondary | ICD-10-CM

## 2022-02-26 DIAGNOSIS — Z95828 Presence of other vascular implants and grafts: Secondary | ICD-10-CM

## 2022-02-27 ENCOUNTER — Ambulatory Visit: Payer: Self-pay | Admitting: *Deleted

## 2022-02-27 NOTE — Patient Outreach (Signed)
  Care Coordination   02/27/2022 Name: SACRAMENTO MONDS MRN: [id number] DOB: 1945/10/25   Care Coordination Outreach Attempts:  An unsuccessful telephone outreach was attempted today to offer the patient information about available care coordination services as a benefit of their health plan.   Follow Up Plan:  Additional outreach attempts will be made to offer the patient care coordination information and services.   Encounter Outcome:  No Answer  Care Coordination Interventions Activated:  No   Care Coordination Interventions:  No, not indicated    SIG Jaiyon Wander L. Lavina Hamman, RN, BSN, [place] Coordinator Office number [phone]

## 2022-02-28 ENCOUNTER — Ambulatory Visit: Payer: Medicare HMO

## 2022-03-07 ENCOUNTER — Ambulatory Visit: Payer: Self-pay | Admitting: *Deleted

## 2022-03-07 ENCOUNTER — Encounter: Payer: Self-pay | Admitting: *Deleted

## 2022-03-07 DIAGNOSIS — C61 Malignant neoplasm of prostate: Secondary | ICD-10-CM

## 2022-03-07 DIAGNOSIS — I10 Essential (primary) hypertension: Secondary | ICD-10-CM

## 2022-03-07 DIAGNOSIS — M461 Sacroiliitis, not elsewhere classified: Secondary | ICD-10-CM

## 2022-03-07 DIAGNOSIS — Z720 Tobacco use: Secondary | ICD-10-CM

## 2022-03-07 DIAGNOSIS — R0602 Shortness of breath: Secondary | ICD-10-CM

## 2022-03-07 DIAGNOSIS — E119 Type 2 diabetes mellitus without complications: Secondary | ICD-10-CM

## 2022-03-07 NOTE — Patient Instructions (Signed)
Visit Information  Thank you for taking time to visit with me today. Please don't hesitate to contact me if I can be of assistance to you.   Following are the goals we discussed today:   Goals Addressed               This Visit's Progress     Patient Stated     Manage Diabetes [facility]) (pt-stated)   Not on track     Care Coordination Interventions: Reviewed medications with patient and discussed importance of medication adherence Counseled on importance of regular laboratory monitoring as prescribed Discussed plans with patient for ongoing care management follow up and provided patient with direct contact information for care management team Review of patient status, including review of consultants reports, relevant laboratory and other test results, and medications completed Screening for signs and symptoms of depression related to chronic disease state  Assessed social determinant of health barriers Discussed how diet and smoking affects diabetes       Resource for housing improvement [facility]) (pt-stated)   Not on track     Care Coordination Interventions: Care Guide referral for housing intervention/ resource for leaking roof in one of back rooms in his trailer Discussed plans with patient for ongoing care management follow up and provided patient with direct contact information for care management team Assessed social determinant of health barriers Discussed mold prevention         Our next appointment is by telephone on 03/27/22 at 3:30 pm  Please call the care guide team at [phone] if you need to cancel or reschedule your appointment.   If you are experiencing a Mental Health or [place] or need someone to talk to, please call the Suicide and Crisis Lifeline: 988 call the [place] National Suicide Prevention Lifeline: 979-849-1918 or TTY: 445-673-6614 TTY 520-742-2156) to talk to a trained counselor call 1-800-273-TALK (toll free, 24 hour hotline) call  the [facility]: 818-658-7802 call 911   The patient verbalized understanding of instructions, educational materials, and care plan provided today and DECLINED offer to receive copy of patient instructions, educational materials, and care plan.   The patient has been provided with contact information for the care management team and has been advised to call with any health related questions or concerns.   [place] Lavina Hamman, RN, BSN, [place] Coordinator Office number [phone]

## 2022-03-07 NOTE — Patient Outreach (Signed)
  Care Coordination   Initial Visit Note   03/07/2022 Name: Andrew [name] MRN: [id number] DOB: Nov 03, 1945  [name] is a 76 y.o. year old male who sees [name], MD for primary care. I spoke with  [name] by phone today.  What matters to the patients health and wellness today?  Diabetes management he reports his cbgs are in the 100s  does not take cbg on a regular basis, may be every other day He still smokes - not interested in stopping Has tried bto stop before unsuccessfully  Prostate cancer history- manageable   Goals Addressed               This Visit's Progress     Patient Stated     Manage Diabetes ([facility]) (pt-stated)   Not on track     Care Coordination Interventions: Reviewed medications with patient and discussed importance of medication adherence Counseled on importance of regular laboratory monitoring as prescribed Discussed plans with patient for ongoing care management follow up and provided patient with direct contact information for care management team Review of patient status, including review of consultants reports, relevant laboratory and other test results, and medications completed Screening for signs and symptoms of depression related to chronic disease state  Assessed social determinant of health barriers Discussed how diet and smoking affects diabetes       Resource for housing improvement [facility]) (pt-stated)   Not on track     Care Coordination Interventions: Care Guide referral for housing intervention/ resource for leaking roof in one of back rooms in his trailer Discussed plans with patient for ongoing care management follow up and provided patient with direct contact information for care management team Assessed social determinant of health barriers Discussed mold prevention         SDOH assessments and interventions completed:  Yes  SDOH Interventions Today    Flowsheet Row Most Recent Value  SDOH Interventions   Food  Insecurity Interventions Intervention Not Indicated  Housing Interventions [id number] Referral  Transportation Interventions Intervention Not Indicated  Financial Strain Interventions Intervention Not Indicated  Stress Interventions Intervention Not Indicated        Care Coordination Interventions Activated:  Yes  Care Coordination Interventions:  Yes, provided   Follow up plan: Follow up call scheduled for 9/28//23 3:30 pm    Encounter Outcome:  Pt. Visit Completed    Fidelis Loth L. Lavina Hamman, RN, BSN, [place] Coordinator Office number [phone]

## 2022-03-11 ENCOUNTER — Telehealth: Payer: Self-pay

## 2022-03-11 NOTE — Telephone Encounter (Signed)
   Telephone encounter was:  Unsuccessful.  03/11/2022 Name: Andrew [name] MRN: [id number] DOB: 1946-06-26  Unsuccessful outbound call made today to assist with:   home roof repair.  Outreach Attempt:  1st Attempt  A HIPAA compliant voice message was left requesting a return call.  Instructed patient to call back at [phone].  [place] Resource Care Guide   ??[email or web address]  ?? [phone]   Website: [email or web address]  [email or web address]  "We don't say no, we SHOW how!"         [facility]

## 2022-03-14 ENCOUNTER — Telehealth: Payer: Self-pay

## 2022-03-14 NOTE — Telephone Encounter (Signed)
   Telephone encounter was:  Unsuccessful.  03/14/2022 Name: Andrew [name] MRN: [id number] DOB: 12/01/1945  Unsuccessful outbound call made today to assist with:   roof repair.  Outreach Attempt:  2nd Attempt  A HIPAA compliant voice message was left requesting a return call.  Instructed patient to call back at [phone].  [place] Resource Care Guide   ??[email or web address]  ?? [phone]   Website: [email or web address]  [email or web address]  "We don't say no, we SHOW how!"         [facility]

## 2022-03-17 ENCOUNTER — Telehealth: Payer: Self-pay

## 2022-03-17 NOTE — Telephone Encounter (Signed)
   Telephone encounter was:  Unsuccessful.  03/17/2022 Name: Andrew [name] MRN: [id number] DOB: [birth year]  Unsuccessful outbound call made today to assist with:   roof repair.  Outreach Attempt:  3rd Attempt.  Referral closed unable to contact patient.  A HIPAA compliant voice message was left requesting a return call.  Instructed patient to call back at [phone]. Left message on voicemail for patient to return my call regarding home roof repair.  [place] Resource Care Guide   ??[email or web address]  ?? [phone]   Website: [email or web address]  [email or web address]  "We don't say no, we SHOW how!"         The [facility] Department

## 2022-03-19 ENCOUNTER — Inpatient Hospital Stay: Payer: Medicare HMO | Attending: Hematology

## 2022-03-19 ENCOUNTER — Inpatient Hospital Stay: Payer: Medicare HMO

## 2022-03-19 VITALS — BP 146/73 | HR 67 | Temp 97.9°F | Resp 18 | Wt 172.6 lb

## 2022-03-19 DIAGNOSIS — C61 Malignant neoplasm of prostate: Secondary | ICD-10-CM | POA: Insufficient documentation

## 2022-03-19 DIAGNOSIS — C7951 Secondary malignant neoplasm of bone: Secondary | ICD-10-CM | POA: Diagnosis present

## 2022-03-19 DIAGNOSIS — Z7982 Long term (current) use of aspirin: Secondary | ICD-10-CM | POA: Diagnosis not present

## 2022-03-19 DIAGNOSIS — C775 Secondary and unspecified malignant neoplasm of intrapelvic lymph nodes: Secondary | ICD-10-CM | POA: Insufficient documentation

## 2022-03-19 DIAGNOSIS — Z5189 Encounter for other specified aftercare: Secondary | ICD-10-CM | POA: Insufficient documentation

## 2022-03-19 DIAGNOSIS — R634 Abnormal weight loss: Secondary | ICD-10-CM | POA: Insufficient documentation

## 2022-03-19 DIAGNOSIS — Z801 Family history of malignant neoplasm of trachea, bronchus and lung: Secondary | ICD-10-CM | POA: Diagnosis not present

## 2022-03-19 DIAGNOSIS — R69 Illness, unspecified: Secondary | ICD-10-CM | POA: Diagnosis not present

## 2022-03-19 DIAGNOSIS — Z95828 Presence of other vascular implants and grafts: Secondary | ICD-10-CM

## 2022-03-19 DIAGNOSIS — Z5111 Encounter for antineoplastic chemotherapy: Secondary | ICD-10-CM | POA: Diagnosis present

## 2022-03-19 DIAGNOSIS — E876 Hypokalemia: Secondary | ICD-10-CM | POA: Insufficient documentation

## 2022-03-19 DIAGNOSIS — Z79899 Other long term (current) drug therapy: Secondary | ICD-10-CM | POA: Insufficient documentation

## 2022-03-19 DIAGNOSIS — D649 Anemia, unspecified: Secondary | ICD-10-CM | POA: Insufficient documentation

## 2022-03-19 DIAGNOSIS — Z7984 Long term (current) use of oral hypoglycemic drugs: Secondary | ICD-10-CM | POA: Diagnosis not present

## 2022-03-19 DIAGNOSIS — F17210 Nicotine dependence, cigarettes, uncomplicated: Secondary | ICD-10-CM | POA: Insufficient documentation

## 2022-03-19 DIAGNOSIS — D509 Iron deficiency anemia, unspecified: Secondary | ICD-10-CM

## 2022-03-19 LAB — CBC WITH DIFFERENTIAL/PLATELET
Abs Immature Granulocytes: 0.02 10*3/uL (ref 0.00–0.07)
Basophils Absolute: 0.1 10*3/uL (ref 0.0–0.1)
Basophils Relative: 1 %
Eosinophils Absolute: 0.1 10*3/uL (ref 0.0–0.5)
Eosinophils Relative: 2 %
HCT: 30.1 % — ABNORMAL LOW (ref 39.0–52.0)
Hemoglobin: 10.2 g/dL — ABNORMAL LOW (ref 13.0–17.0)
Immature Granulocytes: 0 %
Lymphocytes Relative: 32 %
Lymphs Abs: 2.1 10*3/uL (ref 0.7–4.0)
MCH: 32.6 pg (ref 26.0–34.0)
MCHC: 33.9 g/dL (ref 30.0–36.0)
MCV: 96.2 fL (ref 80.0–100.0)
Monocytes Absolute: 0.6 10*3/uL (ref 0.1–1.0)
Monocytes Relative: 8 %
Neutro Abs: 3.9 10*3/uL (ref 1.7–7.7)
Neutrophils Relative %: 57 %
Platelets: 217 10*3/uL (ref 150–400)
RBC: 3.13 MIL/uL — ABNORMAL LOW (ref 4.22–5.81)
RDW: 13.5 % (ref 11.5–15.5)
WBC: 6.8 10*3/uL (ref 4.0–10.5)
nRBC: 0 % (ref 0.0–0.2)

## 2022-03-19 LAB — COMPREHENSIVE METABOLIC PANEL
ALT: 18 U/L (ref 0–44)
AST: 23 U/L (ref 15–41)
Albumin: 2.7 g/dL — ABNORMAL LOW (ref 3.5–5.0)
Alkaline Phosphatase: 70 U/L (ref 38–126)
Anion gap: 7 (ref 5–15)
BUN: 14 mg/dL (ref 8–23)
CO2: 22 mmol/L (ref 22–32)
Calcium: 8 mg/dL — ABNORMAL LOW (ref 8.9–10.3)
Chloride: 109 mmol/L (ref 98–111)
Creatinine, Ser: 1.21 mg/dL (ref 0.61–1.24)
GFR, Estimated: >60 mL/min (ref >60)
Glucose, Bld: 116 mg/dL — ABNORMAL HIGH (ref 70–99)
Potassium: 3 mmol/L — ABNORMAL LOW (ref 3.5–5.1)
Sodium: 138 mmol/L (ref 135–145)
Total Bilirubin: 0.7 mg/dL (ref 0.3–1.2)
Total Protein: 6.2 g/dL — ABNORMAL LOW (ref 6.5–8.1)

## 2022-03-19 LAB — PSA: Prostatic Specific Antigen: 9.97 ng/mL — ABNORMAL HIGH (ref 0.00–4.00)

## 2022-03-19 LAB — MAGNESIUM: Magnesium: 1.1 mg/dL — ABNORMAL LOW (ref 1.7–2.4)

## 2022-03-19 MED ORDER — PALONOSETRON HCL INJECTION 0.25 MG/5ML
0.2500 mg | Freq: Once | INTRAVENOUS | Status: AC
  Administered 2022-03-19: 0.25 mg via INTRAVENOUS
  Filled 2022-03-19: qty 5

## 2022-03-19 MED ORDER — SODIUM CHLORIDE 0.9 % IV SOLN
50.0000 mg/m2 | Freq: Once | INTRAVENOUS | Status: AC
  Administered 2022-03-19: 100 mg via INTRAVENOUS
  Filled 2022-03-19: qty 10

## 2022-03-19 MED ORDER — SODIUM CHLORIDE 0.9 % IV SOLN
10.0000 mg | Freq: Once | INTRAVENOUS | Status: AC
  Administered 2022-03-19: 10 mg via INTRAVENOUS
  Filled 2022-03-19: qty 10

## 2022-03-19 MED ORDER — LEUPROLIDE ACETATE (6 MONTH) 45 MG ~~LOC~~ KIT
45.0000 mg | PACK | Freq: Once | SUBCUTANEOUS | Status: AC
  Administered 2022-03-19: 45 mg via SUBCUTANEOUS
  Filled 2022-03-19: qty 45

## 2022-03-19 MED ORDER — SODIUM CHLORIDE 0.9% FLUSH
10.0000 mL | INTRAVENOUS | Status: DC | PRN
  Administered 2022-03-19: 10 mL

## 2022-03-19 MED ORDER — SODIUM CHLORIDE 0.9 % IV SOLN: Freq: Once | INTRAVENOUS | Status: AC

## 2022-03-19 MED ORDER — HEPARIN SOD (PORK) LOCK FLUSH 100 UNIT/ML IV SOLN
500.0000 [IU] | Freq: Once | INTRAVENOUS | Status: AC | PRN
  Administered 2022-03-19: 500 [IU]

## 2022-03-19 NOTE — Progress Notes (Signed)
Pt presents today for Docetaxel and Eligard injection per provider's order. Vital signs and other labs WNL for treatment today. Pt's magnesium was 1.1 and potassium was 3.0. Message sent to Dr.Shadad and he stated pt was okay to proceed with treatment. No additional supplements needed per Dr.Shadad.  Docetaxel and Eligard injection given today per MD orders. Tolerated infusion without adverse affects. Vital signs stable. No complaints at this time. Discharged from clinic ambulatory in stable condition. Alert and oriented x 3. F/U with [facility] as scheduled.

## 2022-03-19 NOTE — Patient Instructions (Signed)
[facility]  Discharge Instructions: Thank you for choosing [facility] to provide your oncology and hematology care.  If you have a lab appointment with the [facility], please come in thru the Main Entrance and check in at the main information desk.  Wear comfortable clothing and clothing appropriate for easy access to any Portacath or PICC line.   We strive to give you quality time with your provider. You may need to reschedule your appointment if you arrive late (15 or more minutes).  Arriving late affects you and other patients whose appointments are after yours.  Also, if you miss three or more appointments without notifying the office, you may be dismissed from the clinic at the provider's discretion.      For prescription refill requests, have your pharmacy contact our office and allow 72 hours for refills to be completed.    Today you received the following chemotherapy and/or immunotherapy agents Docetaxel   To help prevent nausea and vomiting after your treatment, we encourage you to take your nausea medication as directed.  Docetaxel Injection What is this medication? DOCETAXEL (doe se TAX el) treats some types of cancer. It works by slowing down the growth of cancer cells. This medicine may be used for other purposes; ask your health care provider or pharmacist if you have questions. COMMON BRAND NAME(S): Docefrez, Taxotere What should I tell my care team before I take this medication? They need to know if you have any of these conditions: Kidney disease Liver disease Low white blood cell levels Tingling of the fingers or toes or other nerve disorder An unusual or allergic reaction to docetaxel, polysorbate 80, other medications, foods, dyes, or preservatives Pregnant or trying to get pregnant Breast-feeding How should I use this medication? This medication is injected into a vein. It is given by your care team in a hospital or clinic  setting. Talk to your care team about the use of this medication in children. Special care may be needed. Overdosage: If you think you have taken too much of this medicine contact a poison control center or emergency room at once. NOTE: This medicine is only for you. Do not share this medicine with others. What if I miss a dose? Keep appointments for follow-up doses. It is important not to miss your dose. Call your care team if you are unable to keep an appointment. What may interact with this medication? Do not take this medication with any of the following: Live virus vaccines This medication may also interact with the following: Certain antibiotics, such as clarithromycin, telithromycin Certain antivirals for HIV or hepatitis Certain medications for fungal infections, such as itraconazole, ketoconazole, voriconazole Grapefruit juice Nefazodone Supplements, such as St. John's wort This list may not describe all possible interactions. Give your health care provider a list of all the medicines, herbs, non-prescription drugs, or dietary supplements you use. Also tell them if you smoke, drink alcohol, or use illegal drugs. Some items may interact with your medicine. What should I watch for while using this medication? This medication may make you feel generally unwell. This is not uncommon as chemotherapy can affect healthy cells as well as cancer cells. Report any side effects. Continue your course of treatment even though you feel ill unless your care team tells you to stop. You may need blood work done while you are taking this medication. This medication can cause serious side effects and infusion reactions. To reduce the risk, your care  team may give you other medications to take before receiving this one. Be sure to follow the directions from your care team. This medication may increase your risk of getting an infection. Call your care team for advice if you get a fever, chills, sore throat,  or other symptoms of a cold or flu. Do not treat yourself. Try to avoid being around people who are sick. Avoid taking medications that contain aspirin, acetaminophen, ibuprofen, naproxen, or ketoprofen unless instructed by your care team. These medications may hide a fever. Be careful brushing or flossing your teeth or using a toothpick because you may get an infection or bleed more easily. If you have any dental work done, tell your dentist you are receiving this medication. Some products may contain alcohol. Ask your care team if this medication contains alcohol. Be sure to tell all care teams you are taking this medicine. Certain medications, like metronidazole and disulfiram, can cause an unpleasant reaction when taken with alcohol. The reaction includes flushing, headache, nausea, vomiting, sweating, and increased thirst. The reaction can last from 30 minutes to several hours. This medication may affect your coordination, reaction time, or judgement. Do not drive or operate machinery until you know how this medication affects you. Sit up or stand slowly to reduce the risk of dizzy or fainting spells. Drinking alcohol with this medication can increase the risk of these side effects. Talk to your care team about your risk of cancer. You may be more at risk for certain types of cancer if you take this medication. Talk to your care team if you wish to become pregnant or think you might be pregnant. This medication can cause serious birth defects if taken during pregnancy or if you get pregnant within 2 months after stopping therapy. A negative pregnancy test is required before starting this medication. A reliable form of contraception is recommended while taking this medication and for 2 months after stopping it. Talk to your care team about reliable forms of contraception. Do not breast-feed while taking this medication and for 1 week after stopping therapy. Use a condom during sex and for 4 months after  stopping therapy. Tell your care team right away if you think your partner might be pregnant. This medication can cause serious birth defects. This medication may cause infertility. Talk to your care team if you are concerned about your fertility. What side effects may I notice from receiving this medication? Side effects that you should report to your care team as soon as possible: Allergic reactions--skin rash, itching, hives, swelling of the face, lips, tongue, or throat Change in vision such as blurry vision, seeing halos around lights, vision loss Infection--fever, chills, cough, or sore throat Infusion reactions--chest pain, shortness of breath or trouble breathing, feeling faint or lightheaded Low red blood cell level--unusual weakness or fatigue, dizziness, headache, trouble breathing Pain, tingling, or numbness in the hands or feet Painful swelling, warmth, or redness of the skin, blisters or sores at the infusion site Redness, blistering, peeling, or loosening of the skin, including inside the mouth Sudden or severe stomach pain, bloody diarrhea, fever, nausea, vomiting Swelling of the ankles, hands, or feet Tumor lysis syndrome (TLS)--nausea, vomiting, diarrhea, decrease in the amount of urine, dark urine, unusual weakness or fatigue, confusion, muscle pain or cramps, fast or irregular heartbeat, joint pain Unusual bruising or bleeding Side effects that usually do not require medical attention (report to your care team if they continue or are bothersome): Change in nail shape, thickness,  or color Change in taste Hair loss Increased tears This list may not describe all possible side effects. Call your doctor for medical advice about side effects. You may report side effects to FDA at 1-800-FDA-1088. Where should I keep my medication? This medication is given in a hospital or clinic. It will not be stored at home. NOTE: This sheet is a summary. It may not cover all possible  information. If you have questions about this medicine, talk to your doctor, pharmacist, or health care provider.  2023 Elsevier/Gold Standard (2021-08-16 00:00:00)   BELOW ARE SYMPTOMS THAT SHOULD BE REPORTED IMMEDIATELY: *FEVER GREATER THAN 100.4 F (38 C) OR HIGHER *CHILLS OR SWEATING *NAUSEA AND VOMITING THAT IS NOT CONTROLLED WITH YOUR NAUSEA MEDICATION *UNUSUAL SHORTNESS OF BREATH *UNUSUAL BRUISING OR BLEEDING *URINARY PROBLEMS (pain or burning when urinating, or frequent urination) *BOWEL PROBLEMS (unusual diarrhea, constipation, pain near the anus) TENDERNESS IN MOUTH AND THROAT WITH OR WITHOUT PRESENCE OF ULCERS (sore throat, sores in mouth, or a toothache) UNUSUAL RASH, SWELLING OR PAIN  UNUSUAL VAGINAL DISCHARGE OR ITCHING   Items with * indicate a potential emergency and should be followed up as soon as possible or go to the Emergency Department if any problems should occur.  Please show the CHEMOTHERAPY ALERT CARD or IMMUNOTHERAPY ALERT CARD at check-in to the Emergency Department and triage nurse.  Should you have questions after your visit or need to cancel or reschedule your appointment, please contact [facility] [phone]  and follow the prompts.  Office hours are 8:00 a.m. to 4:30 p.m. Monday - Friday. Please note that voicemails left after 4:00 p.m. may not be returned until the following business day.  We are closed weekends and major holidays. You have access to a nurse at all times for urgent questions. Please call the main number to the clinic [phone] and follow the prompts.  For any non-urgent questions, you may also contact your provider using MyChart. We now offer e-Visits for anyone 69 and older to request care online for non-urgent symptoms. For details visit [email or web address].   Also download the MyChart app! Go to the app store, search "MyChart", open the app, select [facility], and log in with your MyChart username and  password.  Masks are optional in the cancer centers. If you would like for your care team to wear a mask while they are taking care of you, please let them know. You may have one support person who is at least 75 years old accompany you for your appointments.

## 2022-03-21 ENCOUNTER — Inpatient Hospital Stay: Payer: Medicare HMO

## 2022-03-21 VITALS — BP 142/74 | HR 82 | Temp 97.9°F | Resp 18

## 2022-03-21 DIAGNOSIS — C61 Malignant neoplasm of prostate: Secondary | ICD-10-CM

## 2022-03-21 DIAGNOSIS — E876 Hypokalemia: Secondary | ICD-10-CM | POA: Diagnosis not present

## 2022-03-21 DIAGNOSIS — R69 Illness, unspecified: Secondary | ICD-10-CM | POA: Diagnosis not present

## 2022-03-21 DIAGNOSIS — C7951 Secondary malignant neoplasm of bone: Secondary | ICD-10-CM | POA: Diagnosis not present

## 2022-03-21 DIAGNOSIS — Z95828 Presence of other vascular implants and grafts: Secondary | ICD-10-CM

## 2022-03-21 DIAGNOSIS — Z7982 Long term (current) use of aspirin: Secondary | ICD-10-CM | POA: Diagnosis not present

## 2022-03-21 DIAGNOSIS — C775 Secondary and unspecified malignant neoplasm of intrapelvic lymph nodes: Secondary | ICD-10-CM | POA: Diagnosis not present

## 2022-03-21 DIAGNOSIS — D649 Anemia, unspecified: Secondary | ICD-10-CM | POA: Diagnosis not present

## 2022-03-21 DIAGNOSIS — Z801 Family history of malignant neoplasm of trachea, bronchus and lung: Secondary | ICD-10-CM | POA: Diagnosis not present

## 2022-03-21 DIAGNOSIS — Z7984 Long term (current) use of oral hypoglycemic drugs: Secondary | ICD-10-CM | POA: Diagnosis not present

## 2022-03-21 DIAGNOSIS — R634 Abnormal weight loss: Secondary | ICD-10-CM | POA: Diagnosis not present

## 2022-03-21 DIAGNOSIS — Z5189 Encounter for other specified aftercare: Secondary | ICD-10-CM | POA: Diagnosis not present

## 2022-03-21 DIAGNOSIS — Z79899 Other long term (current) drug therapy: Secondary | ICD-10-CM | POA: Diagnosis not present

## 2022-03-21 DIAGNOSIS — Z5111 Encounter for antineoplastic chemotherapy: Secondary | ICD-10-CM | POA: Diagnosis not present

## 2022-03-21 MED ORDER — PEGFILGRASTIM-JMDB 6 MG/0.6ML ~~LOC~~ SOSY
6.0000 mg | PREFILLED_SYRINGE | Freq: Once | SUBCUTANEOUS | Status: AC
  Administered 2022-03-21: 6 mg via SUBCUTANEOUS
  Filled 2022-03-21: qty 0.6

## 2022-03-21 NOTE — Patient Instructions (Addendum)
[facility]  Discharge Instructions: Thank you for choosing [facility] to provide your oncology and hematology care.  If you have a lab appointment with the [facility], please come in thru the Main Entrance and check in at the main information desk.  Wear comfortable clothing and clothing appropriate for easy access to any Portacath or PICC line.   We strive to give you quality time with your provider. You may need to reschedule your appointment if you arrive late (15 or more minutes).  Arriving late affects you and other patients whose appointments are after yours.  Also, if you miss three or more appointments without notifying the office, you may be dismissed from the clinic at the provider's discretion.      For prescription refill requests, have your pharmacy contact our office and allow 72 hours for refills to be completed.    Today you received fulphia injection   To help prevent nausea and vomiting after your treatment, we encourage you to take your nausea medication as directed.  BELOW ARE SYMPTOMS THAT SHOULD BE REPORTED IMMEDIATELY: *FEVER GREATER THAN 100.4 F (38 C) OR HIGHER *CHILLS OR SWEATING *NAUSEA AND VOMITING THAT IS NOT CONTROLLED WITH YOUR NAUSEA MEDICATION *UNUSUAL SHORTNESS OF BREATH *UNUSUAL BRUISING OR BLEEDING *URINARY PROBLEMS (pain or burning when urinating, or frequent urination) *BOWEL PROBLEMS (unusual diarrhea, constipation, pain near the anus) TENDERNESS IN MOUTH AND THROAT WITH OR WITHOUT PRESENCE OF ULCERS (sore throat, sores in mouth, or a toothache) UNUSUAL RASH, SWELLING OR PAIN  UNUSUAL VAGINAL DISCHARGE OR ITCHING   Items with * indicate a potential emergency and should be followed up as soon as possible or go to the Emergency Department if any problems should occur.  Please show the CHEMOTHERAPY ALERT CARD or IMMUNOTHERAPY ALERT CARD at check-in to the Emergency Department and triage nurse.  Should you have  questions after your visit or need to cancel or reschedule your appointment, please contact [facility] [phone]  and follow the prompts.  Office hours are 8:00 a.m. to 4:30 p.m. Monday - Friday. Please note that voicemails left after 4:00 p.m. may not be returned until the following business day.  We are closed weekends and major holidays. You have access to a nurse at all times for urgent questions. Please call the main number to the clinic [phone] and follow the prompts.  For any non-urgent questions, you may also contact your provider using MyChart. We now offer e-Visits for anyone 37 and older to request care online for non-urgent symptoms. For details visit [email or web address].   Also download the MyChart app! Go to the app store, search "MyChart", open the app, select [facility], and log in with your MyChart username and password.  Masks are optional in the cancer centers. If you would like for your care team to wear a mask while they are taking care of you, please let them know. You may have one support person who is at least 76 years old accompany you for your appointments.

## 2022-03-21 NOTE — Progress Notes (Signed)
Fulphia injection given per orders. Patient tolerated it well without problems. Vitals stable and discharged home from clinic ambulatory. Follow up as scheduled.

## 2022-03-24 ENCOUNTER — Telehealth: Payer: Self-pay | Admitting: Dietician

## 2022-03-24 NOTE — Telephone Encounter (Signed)
Attempted to contact patient for nutrition follow-up via telephone. Patient did not answer. Left VM with request for return call. Contact information provided.

## 2022-03-27 ENCOUNTER — Inpatient Hospital Stay (HOSPITAL_BASED_OUTPATIENT_CLINIC_OR_DEPARTMENT_OTHER): Payer: Medicare HMO | Admitting: Hematology

## 2022-03-27 ENCOUNTER — Inpatient Hospital Stay: Payer: Medicare HMO

## 2022-03-27 ENCOUNTER — Ambulatory Visit: Payer: Self-pay | Admitting: *Deleted

## 2022-03-27 VITALS — BP 104/68 | HR 87

## 2022-03-27 DIAGNOSIS — C7951 Secondary malignant neoplasm of bone: Secondary | ICD-10-CM | POA: Diagnosis not present

## 2022-03-27 DIAGNOSIS — D509 Iron deficiency anemia, unspecified: Secondary | ICD-10-CM

## 2022-03-27 DIAGNOSIS — Z95828 Presence of other vascular implants and grafts: Secondary | ICD-10-CM

## 2022-03-27 DIAGNOSIS — C61 Malignant neoplasm of prostate: Secondary | ICD-10-CM

## 2022-03-27 DIAGNOSIS — Z801 Family history of malignant neoplasm of trachea, bronchus and lung: Secondary | ICD-10-CM | POA: Diagnosis not present

## 2022-03-27 DIAGNOSIS — Z5111 Encounter for antineoplastic chemotherapy: Secondary | ICD-10-CM | POA: Diagnosis not present

## 2022-03-27 DIAGNOSIS — R69 Illness, unspecified: Secondary | ICD-10-CM | POA: Diagnosis not present

## 2022-03-27 DIAGNOSIS — Z7982 Long term (current) use of aspirin: Secondary | ICD-10-CM | POA: Diagnosis not present

## 2022-03-27 DIAGNOSIS — E876 Hypokalemia: Secondary | ICD-10-CM | POA: Diagnosis not present

## 2022-03-27 DIAGNOSIS — Z7984 Long term (current) use of oral hypoglycemic drugs: Secondary | ICD-10-CM | POA: Diagnosis not present

## 2022-03-27 DIAGNOSIS — Z79899 Other long term (current) drug therapy: Secondary | ICD-10-CM | POA: Diagnosis not present

## 2022-03-27 DIAGNOSIS — D649 Anemia, unspecified: Secondary | ICD-10-CM | POA: Diagnosis not present

## 2022-03-27 DIAGNOSIS — R634 Abnormal weight loss: Secondary | ICD-10-CM | POA: Diagnosis not present

## 2022-03-27 DIAGNOSIS — Z5189 Encounter for other specified aftercare: Secondary | ICD-10-CM | POA: Diagnosis not present

## 2022-03-27 DIAGNOSIS — C775 Secondary and unspecified malignant neoplasm of intrapelvic lymph nodes: Secondary | ICD-10-CM | POA: Diagnosis not present

## 2022-03-27 LAB — CBC WITH DIFFERENTIAL/PLATELET
Abs Immature Granulocytes: 0.3 10*3/uL — ABNORMAL HIGH (ref 0.00–0.07)
Band Neutrophils: 5 %
Basophils Absolute: 0 10*3/uL (ref 0.0–0.1)
Basophils Relative: 0 %
Eosinophils Absolute: 0 10*3/uL (ref 0.0–0.5)
Eosinophils Relative: 0 %
HCT: 27.1 % — ABNORMAL LOW (ref 39.0–52.0)
Hemoglobin: 9.2 g/dL — ABNORMAL LOW (ref 13.0–17.0)
Lymphocytes Relative: 15 %
Lymphs Abs: 2.3 10*3/uL (ref 0.7–4.0)
MCH: 32.3 pg (ref 26.0–34.0)
MCHC: 33.9 g/dL (ref 30.0–36.0)
MCV: 95.1 fL (ref 80.0–100.0)
Metamyelocytes Relative: 1 %
Monocytes Absolute: 0.8 10*3/uL (ref 0.1–1.0)
Monocytes Relative: 5 %
Myelocytes: 1 %
Neutro Abs: 11.7 10*3/uL — ABNORMAL HIGH (ref 1.7–7.7)
Neutrophils Relative %: 73 %
Platelets: 152 10*3/uL (ref 150–400)
RBC: 2.85 MIL/uL — ABNORMAL LOW (ref 4.22–5.81)
RDW: 13.4 % (ref 11.5–15.5)
WBC: 15 10*3/uL — ABNORMAL HIGH (ref 4.0–10.5)
nRBC: 0 % (ref 0.0–0.2)

## 2022-03-27 LAB — COMPREHENSIVE METABOLIC PANEL
ALT: 15 U/L (ref 0–44)
AST: 19 U/L (ref 15–41)
Albumin: 2.6 g/dL — ABNORMAL LOW (ref 3.5–5.0)
Alkaline Phosphatase: 69 U/L (ref 38–126)
Anion gap: 9 (ref 5–15)
BUN: 10 mg/dL (ref 8–23)
CO2: 20 mmol/L — ABNORMAL LOW (ref 22–32)
Calcium: 7.6 mg/dL — ABNORMAL LOW (ref 8.9–10.3)
Chloride: 104 mmol/L (ref 98–111)
Creatinine, Ser: 1.37 mg/dL — ABNORMAL HIGH (ref 0.61–1.24)
GFR, Estimated: 53 mL/min — ABNORMAL LOW (ref >60)
Glucose, Bld: 117 mg/dL — ABNORMAL HIGH (ref 70–99)
Potassium: 2.8 mmol/L — ABNORMAL LOW (ref 3.5–5.1)
Sodium: 133 mmol/L — ABNORMAL LOW (ref 135–145)
Total Bilirubin: 1 mg/dL (ref 0.3–1.2)
Total Protein: 6.1 g/dL — ABNORMAL LOW (ref 6.5–8.1)

## 2022-03-27 LAB — MAGNESIUM: Magnesium: 1 mg/dL — ABNORMAL LOW (ref 1.7–2.4)

## 2022-03-27 MED ORDER — HEPARIN SOD (PORK) LOCK FLUSH 100 UNIT/ML IV SOLN
500.0000 [IU] | Freq: Once | INTRAVENOUS | Status: AC | PRN
  Administered 2022-03-27: 500 [IU]

## 2022-03-27 MED ORDER — ALTEPLASE 2 MG IJ SOLR: 2.0000 mg | Freq: Once | INTRAMUSCULAR | Status: DC | PRN

## 2022-03-27 MED ORDER — SODIUM CHLORIDE 0.9% FLUSH
10.0000 mL | Freq: Once | INTRAVENOUS | Status: AC | PRN
  Administered 2022-03-27: 10 mL

## 2022-03-27 MED ORDER — MAGNESIUM SULFATE 2 GM/50ML IV SOLN
2.0000 g | INTRAVENOUS | Status: AC
  Administered 2022-03-27 (×2): 2 g via INTRAVENOUS
  Filled 2022-03-27 (×2): qty 50

## 2022-03-27 MED ORDER — POTASSIUM CHLORIDE CRYS ER 20 MEQ PO TBCR
40.0000 meq | EXTENDED_RELEASE_TABLET | ORAL | Status: AC
  Administered 2022-03-27 (×2): 40 meq via ORAL
  Filled 2022-03-27 (×2): qty 2

## 2022-03-27 MED ORDER — POTASSIUM CHLORIDE IN NACL 20-0.9 MEQ/L-% IV SOLN
Freq: Once | INTRAVENOUS | Status: AC
  Filled 2022-03-27: qty 1000

## 2022-03-27 NOTE — Patient Instructions (Signed)
[facility]  Discharge Instructions: Thank you for choosing [facility] to provide your oncology and hematology care.  If you have a lab appointment with the [facility], please come in thru the Main Entrance and check in at the main information desk.  Wear comfortable clothing and clothing appropriate for easy access to any Portacath or PICC line.   We strive to give you quality time with your provider. You may need to reschedule your appointment if you arrive late (15 or more minutes).  Arriving late affects you and other patients whose appointments are after yours.  Also, if you miss three or more appointments without notifying the office, you may be dismissed from the clinic at the provider's discretion.      For prescription refill requests, have your pharmacy contact our office and allow 72 hours for refills to be completed.    Today you received the following Magnesium and potassium, return as scheduled.   To help prevent nausea and vomiting after your treatment, we encourage you to take your nausea medication as directed.  BELOW ARE SYMPTOMS THAT SHOULD BE REPORTED IMMEDIATELY: *FEVER GREATER THAN 100.4 F (38 C) OR HIGHER *CHILLS OR SWEATING *NAUSEA AND VOMITING THAT IS NOT CONTROLLED WITH YOUR NAUSEA MEDICATION *UNUSUAL SHORTNESS OF BREATH *UNUSUAL BRUISING OR BLEEDING *URINARY PROBLEMS (pain or burning when urinating, or frequent urination) *BOWEL PROBLEMS (unusual diarrhea, constipation, pain near the anus) TENDERNESS IN MOUTH AND THROAT WITH OR WITHOUT PRESENCE OF ULCERS (sore throat, sores in mouth, or a toothache) UNUSUAL RASH, SWELLING OR PAIN  UNUSUAL VAGINAL DISCHARGE OR ITCHING   Items with * indicate a potential emergency and should be followed up as soon as possible or go to the Emergency Department if any problems should occur.  Please show the CHEMOTHERAPY ALERT CARD or IMMUNOTHERAPY ALERT CARD at check-in to the Emergency  Department and triage nurse.  Should you have questions after your visit or need to cancel or reschedule your appointment, please contact [facility] [phone]  and follow the prompts.  Office hours are 8:00 a.m. to 4:30 p.m. Monday - Friday. Please note that voicemails left after 4:00 p.m. may not be returned until the following business day.  We are closed weekends and major holidays. You have access to a nurse at all times for urgent questions. Please call the main number to the clinic [phone] and follow the prompts.  For any non-urgent questions, you may also contact your provider using MyChart. We now offer e-Visits for anyone 48 and older to request care online for non-urgent symptoms. For details visit [email or web address].   Also download the MyChart app! Go to the app store, search "MyChart", open the app, select [facility], and log in with your MyChart username and password.  Masks are optional in the cancer centers. If you would like for your care team to wear a mask while they are taking care of you, please let them know. You may have one support person who is at least 76 years old accompany you for your appointments.

## 2022-03-27 NOTE — Progress Notes (Signed)
Patient presents today for 21mq of PO potassium, 20 mEq of IV potassium, 40 mEq PO potassium, and 4g of magnesium per Dr. KTomie Chinaorders. Patient tolerated therapy with no complaints voiced. Side effects with management reviewed with understanding verbalized. Port site clean and dry with no bruising or swelling noted at site. Good blood return noted before and after administration of therapy. Band aid applied. Patient left in satisfactory condition with VSS and no s/s of distress noted.

## 2022-03-27 NOTE — Progress Notes (Signed)
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 Report date is 05/07/2021.  The Multi-Cancer + RNA Panel offered by [facility] includes sequencing and/or deletion/duplication analysis of the following 84 genes:  AIP*, ALK, APC*, ATM*, AXIN2*, BAP1*, BARD1*, BLM*, BMPR1A*, BRCA1*, BRCA2*, BRIP1*, CASR, CDC73*, CDH1*, CDK4, CDKN1B*, CDKN1C*, CDKN2A, CEBPA, CHEK2*, CTNNA1*, DICER1*, DIS3L2*, EGFR, EPCAM, FH*, FLCN*, GATA2*, GPC3, GREM1, HOXB13, HRAS, KIT, MAX*, MEN1*, MET, MITF, MLH1*, MSH2*, MSH3*, MSH6*, MUTYH*, NBN*, NF1*, NF2*, NTHL1*, PALB2*, PDGFRA, PHOX2B, PMS2*, POLD1*, POLE*, POT1*, PRKAR1A*, PTCH1*, PTEN*, RAD50*, RAD51C*, RAD51D*, RB1*, RECQL4, RET, RUNX1*, SDHA*, SDHAF2*, SDHB*, SDHC*, SDHD*, SMAD4*, SMARCA4*, SMARCB1*, SMARCE1*, STK11*, SUFU*, TERC, TERT, TMEM127*, Tp53*, TSC1*, TSC2*, VHL*, WRN*, and WT1.  RNA analysis is performed for * genes.   10/21/2021 - 02/12/2022 Chemotherapy   Patient is on Treatment Plan : PROSTATE Docetaxel + Prednisone q21d     10/21/2021 -  Chemotherapy   Patient is on Treatment Plan : PROSTATE Docetaxel (75) + Prednisone q21d       CANCER STAGING:  Cancer Staging  Prostate cancer Andrew [name]) Staging form:  Prostate, [name] 7th Edition - Clinical stage from 04/02/2021: rT2b, N1, PSA: 10 to 19, Gleason 7 - Unsigned   INTERVAL HISTORY:  [name], a 76 y.o. male, seen for follow-up for metastatic prostate cancer.  He received cycle 5 on 03/19/2022.  Denies any nausea vomiting or diarrhea.  He is not drinking enough fluids and reports mild dysuria.  He is drinking about 1 can of boost per day.  He is eating 2-3 small meals per day.  He reports decreased appetite.  He is not taking potassium and magnesium supplements regularly.  REVIEW OF SYSTEMS:  Review of Systems  Constitutional:  Positive for unexpected weight change (-2lb).  Genitourinary:  Positive for dysuria.   Neurological:  Positive for dizziness and headaches.  All other systems reviewed and are negative.   PAST MEDICAL/SURGICAL HISTORY:  Past Medical History:  Diagnosis Date   AKI (acute [name] injury) (Andrew [name]) 10/04/2021   Anemia    BPH (benign prostatic hyperplasia)    Sees Dr [name]   Cataract    Diabetes mellitus without complication Andrew [name])    Educated about COVID-19 virus infection 07/18/2019   Genetic testing 05/09/2021   [facility] Multi-Cancer Panel was Negative. Report date is 05/07/2021.  The Multi-Cancer + RNA Panel offered by [facility] includes sequencing and/or deletion/duplication analysis of the following 84 genes:  AIP*, ALK, APC*, ATM*, AXIN2*, BAP1*, BARD1*, BLM*, BMPR1A*, BRCA1*, BRCA2*, BRIP1*, CASR, CDC73*, CDH1*, CDK4, CDKN1B*, CDKN1C*, CDKN2A, CEBPA, CHEK2*, CTNNA1*, DICER1*, DIS3L2*, EGFR, EPCAM, FH*, F   Hyperlipidemia    Hypertension    Low serum vitamin D    Port-A-Cath in place 10/09/2021   Prostate cancer (Andrew [name]) 2008   w/seed implantation and radiation  Past Surgical History:  Procedure Laterality Date   COLONOSCOPY  2015,2018   PORTACATH PLACEMENT Right 10/17/2021   Procedure: INSERTION PORT-A-CATH, [name];  Surgeon: [name], DO;  Location: [name];  Service: General;  Laterality: Right;  pt  needs to have glucose checked AM of surgery   PROSTATE SURGERY  2008   seed implant    SOCIAL HISTORY:  Social History   Socioeconomic History   Marital status: Married    Spouse name: [place]   Number of children: 2   Years of education: 12   Highest education level: 12th grade  Occupational History   Occupation: [name]    Comment: Retired Geologist, engineering  Tobacco Use   Smoking status: Every Day    Packs/day: 1.00    Years: 55.00    Total pack years: 55.00    Types: Cigarettes    Start date: 06/30/1965   Smokeless tobacco: Never   Tobacco comments:    Has tried nicotine replacement gum  Vaping Use   Vaping Use: Never used  Substance and Sexual Activity   Alcohol use: No   Drug use: No   Sexual activity: Yes  Other Topics Concern   Not on file  Social History Narrative   Retired, lives at home with wife Andrew [name], two grown children, enjoys gardening     Social Determinants of Health   Financial Resource Strain: Low Risk  (03/07/2022)   Overall Financial Resource Strain (CARDIA)    Difficulty of Paying Living Expenses: Not very hard  Food Insecurity: No Food Insecurity (03/07/2022)   Hunger Vital Sign    Worried About Running Out of Food in the Last Year: Never true    [place] in the Last Year: Never true  Transportation Needs: No Transportation Needs (03/07/2022)   PRAPARE - Hydrologist (Medical): No    Lack of Transportation (Non-Medical): No  Physical Activity: Inactive (01/27/2022)   Exercise Vital Sign    Days of Exercise per Week: 0 days    Minutes of Exercise per Session: 0 min  Stress: No Stress Concern Present (03/07/2022)   [place]    Feeling of Stress : Only a little  Social Connections: Moderately Integrated (01/27/2022)   Social Connection and Isolation Panel [NHANES]    Frequency of Communication with Friends and Family: More than three times a week     Frequency of Social Gatherings with Friends and Family: Once a week    Attends Religious Services: More than 4 times per year    Active Member of [employer] or Organizations: No    Attends Archivist Meetings: Never    Marital Status: Married  Human resources officer Violence: Not At Risk (01/27/2022)   Humiliation, Afraid, Rape, and Kick questionnaire    Fear of Current or Ex-Partner: No    Emotionally Abused: No    Physically Abused: No    Sexually Abused: No    FAMILY HISTORY:  Family History  Problem Relation Age of Onset   Diabetes Mother    Heart disease Mother    [name] disease Mother        DIALYSIS   Emphysema Father    Deep vein thrombosis Sister    [name] disease Sister    Lung cancer Sister    Colon polyps Brother    Hypertension Brother    Gout Brother    Colon cancer Neg Hx  Esophageal cancer Neg Hx    Rectal cancer Neg Hx    Prostate cancer Neg Hx     CURRENT MEDICATIONS:  Current Outpatient Medications  Medication Sig Dispense Refill   acetaminophen (TYLENOL) 650 MG CR tablet Take 1,300 mg by mouth every 8 (eight) hours as needed for pain.     alfuzosin (UROXATRAL) 10 MG 24 hr tablet Take 1 tablet (10 mg total) by mouth at bedtime. 90 tablet 3   ammonium lactate (AMLACTIN) 12 % cream      aspirin 81 MG tablet Take 1 tablet (81 mg total) by mouth daily with breakfast. 30 tablet 3   atorvastatin (LIPITOR) 40 MG tablet Take 1 tablet (40 mg total) by mouth daily. (Patient taking differently: Take 40 mg by mouth at bedtime.) 90 tablet 3   BOOSTRIX 5-2.5-18.5 LF-MCG/0.5 injection      cholecalciferol (VITAMIN D) 1000 UNITS tablet Take 1,000 Units by mouth daily.      Dapagliflozin-sAXagliptin (QTERN) 10-5 MG TABS Take 1 tablet by mouth in the morning. 90 tablet 5   DOCEtaxel (TAXOTERE IV) Inject into the vein every 21 ( twenty-one) days.     docusate sodium (COLACE) 100 MG capsule Take 1 capsule (100 mg total) by mouth 2 (two) times daily. (Patient taking  differently: Take 100 mg by mouth daily as needed for mild constipation.) 60 capsule 2   dronabinol (MARINOL) 5 MG capsule Take 1 capsule (5 mg total) by mouth 2 (two) times daily before a meal. 60 capsule 3   escitalopram (LEXAPRO) 10 MG tablet Take 1 tablet (10 mg total) by mouth daily. 90 tablet 1   FARXIGA 10 MG TABS tablet Take 10 mg by mouth daily.     glimepiride (AMARYL) 2 MG tablet Take 2 mg by mouth 2 (two) times daily.     glucose blood test strip 1 each by Other route as needed for other. Use as instructed to test blood sugar daily.  One Touch Verio Test Strips DX: E11.65 100 each 11   lisinopril-hydrochlorothiazide (ZESTORETIC) 20-25 MG tablet Take 1 tablet by mouth daily. 90 tablet 3   loperamide (IMODIUM) 2 MG capsule Take 1 capsule (2 mg total) by mouth as needed for diarrhea or loose stools. 30 capsule 0   magnesium oxide (MAG-OX) 400 (240 Mg) MG tablet Take 1 tablet (400 mg total) by mouth 2 (two) times daily. 60 tablet 2   megestrol (MEGACE) 400 MG/10ML suspension Take 10 mLs (400 mg total) by mouth 2 (two) times daily. 480 mL 4   metFORMIN (GLUCOPHAGE) 1000 MG tablet Take 1 tablet (1,000 mg total) by mouth 2 (two) times daily with a meal. 180 tablet 2   oxyCODONE (ROXICODONE) 5 MG immediate release tablet Take 1 tablet (5 mg total) by mouth every 8 (eight) hours as needed. 10 tablet 0   potassium chloride SA (KLOR-CON M) 20 MEQ tablet Take 1 tablet (20 mEq total) by mouth daily. 30 tablet 6   pramoxine (PROCTOFOAM) 1 % foam Place 1 application. rectally 3 (three) times daily as needed for anal itching. 15 g 0   No current facility-administered medications for this visit.   Facility-Administered Medications Ordered in Other Visits  Medication Dose Route Frequency Provider Last Rate Last Admin   0.9 %  sodium chloride infusion   Intravenous Continuous Andrew Jack, MD 20 mL/hr at 01/08/22 1513 Infusion Verify at 01/08/22 1513    ALLERGIES:  No Known  Allergies  PHYSICAL EXAM:  Performance status (ECOG): 1 -  Symptomatic but completely ambulatory  There were no vitals filed for this visit.  Wt Readings from Last 3 Encounters:  03/27/22 170 lb 3.2 oz (77.2 kg)  03/19/22 172 lb 9.6 oz (78.3 kg)  02/25/22 172 lb (78 kg)   Physical Exam Vitals reviewed.  Constitutional:      Appearance: Normal appearance.  Cardiovascular:     Rate and Rhythm: Normal rate and regular rhythm.     Pulses: Normal pulses.     Heart sounds: Normal heart sounds.  Pulmonary:     Effort: Pulmonary effort is normal.     Breath sounds: Normal breath sounds.  Musculoskeletal:     Right lower leg: No edema.     Left lower leg: No edema.  Neurological:     General: No focal deficit present.     Mental Status: He is alert and oriented to person, place, and time.  Psychiatric:        Mood and Affect: Mood normal.        Behavior: Behavior normal.     LABORATORY DATA:  I have reviewed the labs as listed.     Latest Ref Rng & Units 03/27/2022    9:33 AM 03/19/2022    9:09 AM 02/25/2022    1:16 PM  CBC  WBC 4.0 - 10.5 K/uL 15.0  6.8  6.6   Hemoglobin 13.0 - 17.0 g/dL 9.2  10.2  10.8   Hematocrit 39.0 - 52.0 % 27.1  30.1  33.8   Platelets 150 - 400 K/uL 152  217  226       Latest Ref Rng & Units 03/27/2022    9:33 AM 03/19/2022    9:09 AM 02/25/2022    1:16 PM  CMP  Glucose 70 - 99 mg/dL 117  116  118   BUN 8 - 23 mg/dL _0 Creatinine 0.61 - 1.24 mg/dL 1.37  1.21  1.72   Sodium 135 - 145 mmol/L 133  138  137   Potassium 3.5 - 5.1 mmol/L 2.8  3.0  3.3   Chloride 98 - 111 mmol/L 104  109  109   CO2 22 - 32 mmol/L _1 Calcium 8.9 - 10.3 mg/dL 7.6  8.0  8.5   Total Protein 6.5 - 8.1 g/dL 6.1  6.2    Total Bilirubin 0.3 - 1.2 mg/dL 1.0  0.7    Alkaline Phos 38 - 126 U/L 69  70    AST 15 - 41 U/L 19  23    ALT 0 - 44 U/L 15  18      DIAGNOSTIC IMAGING:  I have independently reviewed the scans and discussed with the patient. No  results found.   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  CTAP on 08/26/2017 shows enlarged left pelvic sidewall lymph node 2.2 cm. - Lupron initiated on 08/10/2019. - CTAP on 03/09/2020 with left internal/external iliac lymph node measuring 2.1 x 2.8 cm (2.1 x 2.2 cm), craniocaudal 4.1 cm, previously 3.4 cm. - Enzalutamide to 160 mg daily started on 04/17/2020.  Discontinued end of October 2022. - PSA on 02/20/2021 (5.2), 07/11/2020 (2.0), 02/08/2020 (3.1) - CTAP on 03/07/2021 with  left iliac bifurcation lymph node measuring 3.5 x 2.6 cm (previously 2.8 x 2.0 cm) - Bone scan on 03/07/2021 negative. - Left pelvic lymph node biopsy on 04/08/2021, metastatic prostate cancer. - We have reviewed PSMA PET scan results which showed enlarged left iliac lymph node 33 mm with SUV 285.  1 mm lymph node and 4 mm node between IVC and aorta.  Activity in the right acetabulum, left iliac bone, left transverse process of T11 vertebral body, spine of the right scapula. - Guardant 360 did not show any targetable mutations.  Did not show MSI high. - NGS testing could not be done because of less than 100 tumor cells. - Abiraterone and prednisone from 07/15/2021 through 10/01/2021 with progression. - Germline mutation [facility] testing was negative. - Cycle 1 of docetaxel on 10/21/2021.   PLAN:  Metastatic castration refractory prostate cancer to the left iliac lymph node: - CT CAP on 01/02/2022 showed unchanged bulky lymph nodes in the left iliac bifurcation.  Bone scan on 02/04/2022 did not show any new changes.  Subtle uptake in the right ribs unchanged. - Cycle 5 chemotherapy dose reduced docetaxel on 03/19/2022.  Reviewed labs today which  showed elevated creatinine 1.37.  Severe hypokalemia with potassium 2.8.  His last PSA has improved to 9.97 from 14.  I have encouraged him to eat better and gain weight.  I will reevaluate him in 4 weeks prior to cycle 6.  We will consider imaging after cycle 6.  2.  Weight loss: - His weight was 172 pounds last week when he received cycle 5.  Today he lost 2 pounds.  He is drinking 1 can of boost per day.  He is eating 2-3 small meals per day.  Continue Megace twice daily.  We will closely monitor.  Weight has more or less stabilized since we switched his treatments to every 5 weeks.  3.  Normocytic anemia: - He has completed 3 dose of Venofer.  Hemoglobin today is 9.2.  4.  Bone metastasis: - Denosumab will be started after dental evaluation.  5.  Hypomagnesemia/hypokalemia: - He was supposed to take magnesium 400 mg twice daily.  He is not taking it regularly.  Magnesium is low at 1.0.  He will receive 4 g IV magnesium.  He was also supposed to take potassium 20 mEq daily which she is not taking.  Potassium today is 2.8.  He will receive both oral and IV potassium.   Orders placed this encounter:  No orders of the defined types were placed in this encounter.    Andrew Jack, MD [place] [phone]   I, Thana Ates, am acting as a scribe for Dr. Derek [name].  I, Andrew Jack MD, have reviewed the above documentation for accuracy and completeness, and I agree with the above.

## 2022-03-27 NOTE — Patient Outreach (Signed)
  Care Coordination   03/27/2022 Name: Andrew [name] MRN: [id number] DOB: Mar 16, 1946   Care Coordination Outreach Attempts:  An unsuccessful telephone outreach was attempted today to offer the patient information about available care coordination services as a benefit of their health plan.   Follow Up Plan:  Additional outreach attempts will be made to offer the patient care coordination information and services.   Encounter Outcome:  No Answer  Care Coordination Interventions Activated:  No   Care Coordination Interventions:  No, not indicated    SIG Ariyona Eid L. Lavina Hamman, RN, BSN, [place] Coordinator Office number [phone]

## 2022-03-27 NOTE — Patient Instructions (Signed)
Andrew [name]  Discharge Instructions  You were seen and examined today by Dr. Delton Coombes.  Your Potassium and Magnesium is low - we will supplement that via IV today.  Follow-up as scheduled.  Thank you for choosing [facility] to provide your oncology and hematology care.   To afford each patient quality time with our provider, please arrive at least 15 minutes before your scheduled appointment time. You may need to reschedule your appointment if you arrive late (10 or more minutes). Arriving late affects you and other patients whose appointments are after yours.  Also, if you miss three or more appointments without notifying the office, you may be dismissed from the clinic at the provider's discretion.    Again, thank you for choosing [facility].  Our hope is that these requests will decrease the amount of time that you wait before being seen by our physicians.   If you have a lab appointment with the [facility] please come in thru the Main Entrance and check in at the main information desk.           _____________________________________________________________  Should you have questions after your visit to [facility], please contact our office at [phone] and follow the prompts.  Our office hours are 8:00 a.m. to 4:30 p.m. Monday - Thursday and 8:00 a.m. to 2:30 p.m. Friday.  Please note that voicemails left after 4:00 p.m. may not be returned until the following business day.  We are closed weekends and all major holidays.  You do have access to a nurse 24-7, just call the main number to the clinic [phone] and do not press any options, hold on the line and a nurse will answer the phone.    For prescription refill requests, have your pharmacy contact our office and allow 72 hours.    Masks are optional in the cancer centers. If you would like for your care team to wear a mask while they are  taking care of you, please let them know. You may have one support person who is at least 76 years old accompany you for your appointments.

## 2022-03-27 NOTE — Progress Notes (Signed)
Patient was seen and examined today by Dr. Delton Coombes. Labs reviewed by Dr. Delton Coombes. Orders received for potassium and magnesium supplementation. Primary RN and Pharmacy made aware.

## 2022-04-08 ENCOUNTER — Ambulatory Visit: Payer: Self-pay | Admitting: *Deleted

## 2022-04-08 NOTE — Patient Outreach (Signed)
  Care Coordination   04/08/2022 Name: Andrew [name] MRN: [id number] DOB: 02/20/1946   Care Coordination Outreach Attempts:  A second unsuccessful outreach was attempted today to offer the patient with information about available care coordination services as a benefit of their health plan.     Follow Up Plan:  Additional outreach attempts will be made to offer the patient care coordination information and services.   Encounter Outcome:  No Answer  Care Coordination Interventions Activated:  No   Care Coordination Interventions:  No, not indicated    SIG Burgundy Matuszak L. Lavina Hamman, RN, BSN, [place] Coordinator Office number [phone]

## 2022-04-14 ENCOUNTER — Ambulatory Visit: Payer: Self-pay | Admitting: *Deleted

## 2022-04-14 DIAGNOSIS — E118 Type 2 diabetes mellitus with unspecified complications: Secondary | ICD-10-CM

## 2022-04-14 DIAGNOSIS — C61 Malignant neoplasm of prostate: Secondary | ICD-10-CM

## 2022-04-14 DIAGNOSIS — I10 Essential (primary) hypertension: Secondary | ICD-10-CM

## 2022-04-14 DIAGNOSIS — N401 Enlarged prostate with lower urinary tract symptoms: Secondary | ICD-10-CM

## 2022-04-14 NOTE — Patient Instructions (Signed)
Visit Information  Thank you for taking time to visit with me today. Please don't hesitate to contact me if I can be of assistance to you.   Following are the goals we discussed today:   Goals Addressed               This Visit's Progress     Patient Stated     Manage Diabetes [facility]) (pt-stated)   On track     Care Coordination Interventions: Counseled on importance of regular laboratory monitoring as prescribed Discussed plans with patient for ongoing care management follow up and provided patient with direct contact information for care management team Reviewed scheduled/upcoming provider appointments including: Dr Livia Snellen, pcp 05/15/22 0930  Review of patient status, including review of consultants reports, relevant laboratory and other test results, and medications completed Assessed social determinant of health barriers Reviewed recent labs, encouraged better intake of fluids and food, suggestions provided for fluid intake vs sodas Confirmed he had an eye exam in 2022 at [facility] for housing improvement [facility]) (pt-stated)   Not on track     Care Coordination Interventions: Collaborated with [facility] SW& care guide  regarding possible assist for home improvement needs now for roof and furnace of mobile home Care Guide referral for housing intervention/ resource for leaking roof in one of back rooms in his trailer unsuccessful Noted Aleda Grana outreached x 3 (9/12, 9/15, 03/17/22), leaving messages, without a return call from patient  Completed a second care guide referral for home improvement for leaking roof in back rooms & broken furnace in trailer.        Our next appointment is by telephone on 05/06/22 at 2 pm  Please call the care guide team at [phone] if you need to cancel or reschedule your appointment.   If you are experiencing a Mental Health or [place] or need someone to talk to, please call the Suicide and Crisis Lifeline: 988 call the  [place] National Suicide Prevention Lifeline: 630-248-6540 or TTY: 802-124-6377 TTY 251-545-8774) to talk to a trained counselor call 1-800-273-TALK (toll free, 24 hour hotline) call the [facility]: 631-718-8799 call 911   The patient verbalized understanding of instructions, educational materials, and care plan provided today and DECLINED offer to receive copy of patient instructions, educational materials, and care plan.   The patient has been provided with contact information for the care management team and has been advised to call with any health related questions or concerns.  He and his wife were encouraged to follow up with [facility] care Guide M. [place] Lavina Hamman, RN, BSN, [place] Coordinator Office number [phone]

## 2022-04-14 NOTE — Patient Outreach (Signed)
  Care Coordination   Follow Up Visit Note   04/14/2022 Name: Andrew [name] MRN: [id number] DOB: Mar 12, 1946  Andrew [name] is a 76 y.o. year old male who sees Derek Jack, MD for primary care. I spoke with  [name] by phone today.  What matters to the patients health and wellness today?  Need resource to help with a damaged roof and heating unit/furnace that is not working in his mobile home (purchased in 1994) Mrs Stefanski wrote down the care guides number and RN CM's number  She also wrote down the upcoming pcp office visit  Voice understanding of the need to improve his intake of fluids and appetite after review of his recent labs. Low levels for magnesium, albumin, protein, sediment rate He states his appetite is fair, but Mrs Laneve confirms it is poor & he needs to drink more water  Health maintenance He and wife report last DM eye exam was completed at [place] CM unable to enter this in [place]               This Visit's Progress     Patient Stated     Manage Diabetes [facility]) (pt-stated)   On track     Care Coordination Interventions: Counseled on importance of regular laboratory monitoring as prescribed Discussed plans with patient for ongoing care management follow up and provided patient with direct contact information for care management team Reviewed scheduled/upcoming provider appointments including: Dr Livia Snellen, pcp 05/15/22 0930  Review of patient status, including review of consultants reports, relevant laboratory and other test results, and medications completed Assessed social determinant of health barriers Reviewed recent labs, encouraged better intake of fluids and food, suggestions provided for fluid intake vs sodas Confirmed he had an eye exam in 2022 at [facility] for housing improvement [facility]) (pt-stated)   Not on track     Care Coordination Interventions: Collaborated with [facility] SW& care guide  regarding possible  assist for home improvement needs now for roof and furnace of mobile home Care Guide referral for housing intervention/ resource for leaking roof in one of back rooms in his trailer unsuccessful Noted Aleda Grana outreached x 3 (9/12, 9/15, 03/17/22), leaving messages, without a return call from patient  Completed a second care guide referral for home improvement for leaking roof in back rooms & broken furnace in trailer.        SDOH assessments and interventions completed:  Yes  SDOH Interventions Today    Flowsheet Row Most Recent Value  SDOH Interventions   Housing Interventions [id number] Referral  [Need resource to help with a damaged roof and heating unit/furnace that is not working in his mobile home (purchased in 1994)]  Utilities Interventions Intervention Not Indicated  Financial Strain Interventions [id number] Referral        Care Coordination Interventions Activated:  Yes  Care Coordination Interventions:  Yes, provided   Follow up plan: Follow up call scheduled for 11/12/29/21    Encounter Outcome:  Pt. Visit Completed

## 2022-04-15 ENCOUNTER — Telehealth: Payer: Self-pay

## 2022-04-15 NOTE — Telephone Encounter (Addendum)
   Telephone encounter was:  Successful.  04/15/2022 Name: [name] MRN: [id number] DOB: 29-Jan-1946  Andrew [name] is a 76 y.o. year old male who is a primary care patient of Derek Jack, MD . The community resource team was consulted for assistance with  home repairs.  Care guide performed the following interventions: Patient provided with information about care guide support team and interviewed to confirm resource needs Placed referral to [facility] via [place] . Mailed Alvester Chou [facility] [place] [facility] Cancer Support [employer].   Follow Up Plan:  Care guide will follow up with patient by phone over the next 7 days.  [place] Resource Care Guide   ??[email or web address]  ?? [phone]   Website: [email or web address]  [email or web address]

## 2022-04-15 NOTE — Telephone Encounter (Signed)
Patient missed last office visit with Dr. Alyson Ingles in August for [place].  Reviewed chart - patient received Eligard dose with Dr. Malachy Mood in September.  Will forward message on to Dr. Alyson Ingles to review.

## 2022-04-16 ENCOUNTER — Ambulatory Visit: Payer: Medicare HMO

## 2022-04-16 ENCOUNTER — Ambulatory Visit: Payer: Medicare HMO | Admitting: Hematology

## 2022-04-16 ENCOUNTER — Other Ambulatory Visit: Payer: Medicare HMO

## 2022-04-18 ENCOUNTER — Ambulatory Visit: Payer: Medicare HMO

## 2022-04-22 ENCOUNTER — Telehealth: Payer: Self-pay

## 2022-04-22 NOTE — Telephone Encounter (Signed)
   Telephone encounter was:  Unsuccessful.  04/22/2022 Name: Andrew [name] MRN: [id number] DOB: 03-31-1946  Unsuccessful outbound call made today to assist with:   home repairs.  Outreach Attempt:  1st Attempt  A HIPAA compliant voice message was left requesting a return call.  Instructed patient to call back at [phone].  Left message at  [facility] Program to inquire on the status of the [facility] referral sent 04/15/22.  [place] Resource Care Guide   ??[email or web address]  ?? [phone]   Website: [email or web address]  [email or web address]

## 2022-04-24 ENCOUNTER — Telehealth: Payer: Self-pay

## 2022-04-24 NOTE — Telephone Encounter (Signed)
   Telephone encounter was:  Successful.  04/24/2022 Name: Andrew [name] MRN: [id number] DOB: 27-Dec-1945  [name] is a 76 y.o. year old male who is a primary care patient of Derek Jack, MD . The community resource team was consulted for assistance with  home repairs.  Care guide performed the following interventions: Received NCCARE 360 message The Home Expense Assistance/Repairs Referral for Marlowe Lawes has been accepted by Charm Rings at [place] on Therapist, art.  Follow Up Plan:  No further follow up planned at this time. The patient has been provided with needed resources.  [place] Resource Care Guide   ??[email or web address]  ?? [phone]   Website: [email or web address]  [email or web address]

## 2022-04-28 ENCOUNTER — Inpatient Hospital Stay: Payer: Medicare HMO

## 2022-04-28 ENCOUNTER — Inpatient Hospital Stay: Payer: Medicare HMO | Attending: Hematology | Admitting: Hematology

## 2022-04-28 VITALS — BP 111/59 | HR 70 | Temp 97.3°F | Resp 18

## 2022-04-28 VITALS — BP 116/63 | HR 78 | Temp 97.0°F | Resp 18 | Wt 172.0 lb

## 2022-04-28 DIAGNOSIS — F17210 Nicotine dependence, cigarettes, uncomplicated: Secondary | ICD-10-CM | POA: Diagnosis not present

## 2022-04-28 DIAGNOSIS — Z5111 Encounter for antineoplastic chemotherapy: Secondary | ICD-10-CM | POA: Insufficient documentation

## 2022-04-28 DIAGNOSIS — Z79899 Other long term (current) drug therapy: Secondary | ICD-10-CM | POA: Diagnosis not present

## 2022-04-28 DIAGNOSIS — E876 Hypokalemia: Secondary | ICD-10-CM

## 2022-04-28 DIAGNOSIS — I10 Essential (primary) hypertension: Secondary | ICD-10-CM | POA: Insufficient documentation

## 2022-04-28 DIAGNOSIS — Z7984 Long term (current) use of oral hypoglycemic drugs: Secondary | ICD-10-CM | POA: Insufficient documentation

## 2022-04-28 DIAGNOSIS — C61 Malignant neoplasm of prostate: Secondary | ICD-10-CM

## 2022-04-28 DIAGNOSIS — C7951 Secondary malignant neoplasm of bone: Secondary | ICD-10-CM | POA: Insufficient documentation

## 2022-04-28 DIAGNOSIS — E119 Type 2 diabetes mellitus without complications: Secondary | ICD-10-CM | POA: Insufficient documentation

## 2022-04-28 DIAGNOSIS — Z801 Family history of malignant neoplasm of trachea, bronchus and lung: Secondary | ICD-10-CM | POA: Insufficient documentation

## 2022-04-28 DIAGNOSIS — Z7982 Long term (current) use of aspirin: Secondary | ICD-10-CM | POA: Diagnosis not present

## 2022-04-28 DIAGNOSIS — Z95828 Presence of other vascular implants and grafts: Secondary | ICD-10-CM | POA: Diagnosis not present

## 2022-04-28 DIAGNOSIS — R69 Illness, unspecified: Secondary | ICD-10-CM | POA: Diagnosis not present

## 2022-04-28 DIAGNOSIS — C775 Secondary and unspecified malignant neoplasm of intrapelvic lymph nodes: Secondary | ICD-10-CM | POA: Diagnosis not present

## 2022-04-28 LAB — COMPREHENSIVE METABOLIC PANEL
ALT: 12 U/L (ref 0–44)
AST: 21 U/L (ref 15–41)
Albumin: 2.7 g/dL — ABNORMAL LOW (ref 3.5–5.0)
Alkaline Phosphatase: 71 U/L (ref 38–126)
Anion gap: 10 (ref 5–15)
BUN: 13 mg/dL (ref 8–23)
CO2: 22 mmol/L (ref 22–32)
Calcium: 8.2 mg/dL — ABNORMAL LOW (ref 8.9–10.3)
Chloride: 105 mmol/L (ref 98–111)
Creatinine, Ser: 1.2 mg/dL (ref 0.61–1.24)
GFR, Estimated: >60 mL/min (ref >60)
Glucose, Bld: 107 mg/dL — ABNORMAL HIGH (ref 70–99)
Potassium: 3.1 mmol/L — ABNORMAL LOW (ref 3.5–5.1)
Sodium: 137 mmol/L (ref 135–145)
Total Bilirubin: 0.7 mg/dL (ref 0.3–1.2)
Total Protein: 6.3 g/dL — ABNORMAL LOW (ref 6.5–8.1)

## 2022-04-28 LAB — CBC WITH DIFFERENTIAL/PLATELET
Abs Immature Granulocytes: 0.01 10*3/uL (ref 0.00–0.07)
Basophils Absolute: 0.1 10*3/uL (ref 0.0–0.1)
Basophils Relative: 1 %
Eosinophils Absolute: 0.2 10*3/uL (ref 0.0–0.5)
Eosinophils Relative: 3 %
HCT: 27.2 % — ABNORMAL LOW (ref 39.0–52.0)
Hemoglobin: 9.1 g/dL — ABNORMAL LOW (ref 13.0–17.0)
Immature Granulocytes: 0 %
Lymphocytes Relative: 30 %
Lymphs Abs: 2.1 10*3/uL (ref 0.7–4.0)
MCH: 32.7 pg (ref 26.0–34.0)
MCHC: 33.5 g/dL (ref 30.0–36.0)
MCV: 97.8 fL (ref 80.0–100.0)
Monocytes Absolute: 0.6 10*3/uL (ref 0.1–1.0)
Monocytes Relative: 8 %
Neutro Abs: 4.2 10*3/uL (ref 1.7–7.7)
Neutrophils Relative %: 58 %
Platelets: 225 10*3/uL (ref 150–400)
RBC: 2.78 MIL/uL — ABNORMAL LOW (ref 4.22–5.81)
RDW: 14.1 % (ref 11.5–15.5)
WBC: 7.2 10*3/uL (ref 4.0–10.5)
nRBC: 0 % (ref 0.0–0.2)

## 2022-04-28 LAB — PSA: Prostatic Specific Antigen: 6.39 ng/mL — ABNORMAL HIGH (ref 0.00–4.00)

## 2022-04-28 LAB — MAGNESIUM: Magnesium: 1.3 mg/dL — ABNORMAL LOW (ref 1.7–2.4)

## 2022-04-28 MED ORDER — SODIUM CHLORIDE 0.9% FLUSH
10.0000 mL | INTRAVENOUS | Status: DC | PRN
  Administered 2022-04-28 (×2): 10 mL

## 2022-04-28 MED ORDER — SODIUM CHLORIDE 0.9 % IV SOLN
50.0000 mg/m2 | Freq: Once | INTRAVENOUS | Status: AC
  Administered 2022-04-28: 100 mg via INTRAVENOUS
  Filled 2022-04-28: qty 10

## 2022-04-28 MED ORDER — MAGNESIUM SULFATE 2 GM/50ML IV SOLN
2.0000 g | INTRAVENOUS | Status: AC
  Administered 2022-04-28 (×2): 2 g via INTRAVENOUS
  Filled 2022-04-28 (×2): qty 50

## 2022-04-28 MED ORDER — MAGNESIUM OXIDE -MG SUPPLEMENT 400 (240 MG) MG PO TABS: 400.0000 mg | ORAL_TABLET | Freq: Two times a day (BID) | ORAL | 2 refills | Status: DC

## 2022-04-28 MED ORDER — HEPARIN SOD (PORK) LOCK FLUSH 100 UNIT/ML IV SOLN
500.0000 [IU] | Freq: Once | INTRAVENOUS | Status: AC | PRN
  Administered 2022-04-28: 500 [IU]

## 2022-04-28 MED ORDER — POTASSIUM CHLORIDE CRYS ER 20 MEQ PO TBCR
40.0000 meq | EXTENDED_RELEASE_TABLET | Freq: Once | ORAL | Status: AC
  Administered 2022-04-28: 40 meq via ORAL
  Filled 2022-04-28: qty 2

## 2022-04-28 MED ORDER — POTASSIUM CHLORIDE CRYS ER 20 MEQ PO TBCR: 20.0000 meq | EXTENDED_RELEASE_TABLET | Freq: Every day | ORAL | 6 refills | Status: DC

## 2022-04-28 MED ORDER — SODIUM CHLORIDE 0.9 % IV SOLN
10.0000 mg | Freq: Once | INTRAVENOUS | Status: AC
  Administered 2022-04-28: 10 mg via INTRAVENOUS
  Filled 2022-04-28: qty 1

## 2022-04-28 MED ORDER — SODIUM CHLORIDE 0.9 % IV SOLN: Freq: Once | INTRAVENOUS | Status: AC

## 2022-04-28 MED ORDER — PALONOSETRON HCL INJECTION 0.25 MG/5ML
0.2500 mg | Freq: Once | INTRAVENOUS | Status: AC
  Administered 2022-04-28: 0.25 mg via INTRAVENOUS
  Filled 2022-04-28: qty 5

## 2022-04-28 NOTE — Progress Notes (Signed)
Orders received to add Magnesium sulfate 2 gm IVPB q 1 hour x 2 doses.  T.O. Dr Rhys Martini, PharmD

## 2022-04-28 NOTE — Progress Notes (Signed)
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 Report date is 05/07/2021.  The Multi-Cancer + RNA Panel offered by [facility]ae includes sequencing and/or deletion/duplication analysis of the following 84 genes:  AIP*, ALK, APC*, ATM*, AXIN2*, BAP1*, BARD1*, BLM*, BMPR1A*, BRCA1*, BRCA2*, BRIP1*, CASR, CDC73*, CDH1*, CDK4, CDKN1B*, CDKN1C*, CDKN2A, CEBPA, CHEK2*, CTNNA1*, DICER1*, DIS3L2*, EGFR, EPCAM, FH*, FLCN*, GATA2*, GPC3, GREM1, HOXB13, HRAS, KIT, MAX*, MEN1*, MET, MITF, MLH1*, MSH2*, MSH3*, MSH6*, MUTYH*, NBN*, NF1*, NF2*, NTHL1*, PALB2*, PDGFRA, PHOX2B, PMS2*, POLD1*, POLE*, POT1*, PRKAR1A*, PTCH1*, PTEN*, RAD50*, RAD51C*, RAD51D*, RB1*, RECQL4, RET, RUNX1*, SDHA*, SDHAF2*, SDHB*, SDHC*, SDHD*, SMAD4*, SMARCA4*, SMARCB1*, SMARCE1*, STK11*, SUFU*, TERC, TERT, TMEM127*, Tp53*, TSC1*, TSC2*, VHL*, WRN*, and WT1.  RNA analysis is performed for * genes.   10/21/2021 - 02/12/2022 Chemotherapy   Patient is on Treatment Plan : PROSTATE Docetaxel + Prednisone q21d     10/21/2021 -  Chemotherapy   Patient is on Treatment Plan : PROSTATE Docetaxel (75) + Prednisone q21d       CANCER STAGING:  Cancer Staging  Prostate cancer [facility]) Staging form:  Prostate, [name] 7th Edition - Clinical stage from 04/02/2021: rT2b, N1, PSA: 10 to 19, Gleason 7 - Unsigned   INTERVAL HISTORY:  [name], a 76 y.o. male, seen for follow-up of metastatic prostate cancer.  He received cycle 5 on 03/19/2022.  Did not have any GI side effects.  Gained 2 pounds since last visit.  Denies any tingling or numbness in extremities.  Reports energy levels are 50%.  REVIEW OF SYSTEMS:  Review of Systems  All other systems reviewed and are negative.   PAST MEDICAL/SURGICAL HISTORY:  Past Medical History:  Diagnosis Date   AKI (acute [name] injury) ([place]) 10/04/2021   Anemia    BPH (benign prostatic hyperplasia)    Sees Dr Andrew [name]   Cataract    Diabetes mellitus without complication [facility])    Educated about COVID-19 virus infection 07/18/2019   Genetic testing 05/09/2021   Invitae Multi-Cancer Panel was Negative. Report date is 05/07/2021.  The Multi-Cancer + RNA Panel offered by [facility]itae includes sequencing and/or deletion/duplication analysis of the following 84 genes:  AIP*, ALK, APC*, ATM*, AXIN2*, BAP1*, BARD1*, BLM*, BMPR1A*, BRCA1*, BRCA2*, BRIP1*, CASR, CDC73*, CDH1*, CDK4, CDKN1B*, CDKN1C*, CDKN2A, CEBPA, CHEK2*, CTNNA1*, DICER1*, DIS3L2*, EGFR, EPCAM, FH*, F   Hyperlipidemia    Hypertension    Low serum vitamin D    Port-A-Cath in place 10/09/2021   Prostate cancer ([place]) 2008   w/seed implantation and radiation   Past Surgical History:  Procedure Laterality Date   COLONOSCOPY  2015,2018   PORTACATH PLACEMENT Right 10/17/2021   Procedure: INSERTION PORT-A-CATH, [name];  Surgeon: [name], DO;  Location: [name];  Service: General;  Laterality: Right;  pt needs to have  glucose checked AM of surgery   PROSTATE SURGERY  2008   seed implant    SOCIAL HISTORY:  Social History   Socioeconomic History   Marital status: Married    Spouse name: Andrew [name]   Number of children: 2   Years of education: 12   Highest education level: 12th grade   Occupational History   Occupation: [name]    Comment: Retired Geologist, engineering  Tobacco Use   Smoking status: Every Day    Packs/day: 1.00    Years: 55.00    Total pack years: 55.00    Types: Cigarettes    Start date: 06/30/1965   Smokeless tobacco: Never   Tobacco comments:    Has tried nicotine replacement gum  Vaping Use   Vaping Use: Never used  Substance and Sexual Activity   Alcohol use: No   Drug use: No   Sexual activity: Yes  Other Topics Concern   Not on file  Social History Narrative   Retired, lives at home with wife Andrew [name], two grown children, enjoys gardening     Social Determinants of Health   Financial Resource Strain: Medium Risk (04/14/2022)   Overall Financial Resource Strain (CARDIA)    Difficulty of Paying Living Expenses: Somewhat hard  Food Insecurity: No Food Insecurity (03/07/2022)   Hunger Vital Sign    Worried About Running Out of Food in the Last Year: Never true    [place] in the Last Year: Never true  Transportation Needs: No Transportation Needs (03/07/2022)   PRAPARE - Hydrologist (Medical): No    Lack of Transportation (Non-Medical): No  Physical Activity: Inactive (01/27/2022)   Exercise Vital Sign    Days of Exercise per Week: 0 days    Minutes of Exercise per Session: 0 min  Stress: No Stress Concern Present (03/07/2022)   [place]    Feeling of Stress : Only a little  Social Connections: Moderately Integrated (01/27/2022)   Social Connection and Isolation Panel [NHANES]    Frequency of Communication with Friends and Family: More than three times a week    Frequency of Social Gatherings with Friends and Family: Once a week    Attends Religious Services: More than 4 times per year    Active Member of [employer] or Organizations: No    Attends Archivist Meetings: Never    Marital Status: Married  Human resources officer Violence: Not  At Risk (01/27/2022)   Humiliation, Afraid, Rape, and Kick questionnaire    Fear of Current or Ex-Partner: No    Emotionally Abused: No    Physically Abused: No    Sexually Abused: No    FAMILY HISTORY:  Family History  Problem Relation Age of Onset   Diabetes Mother    Heart disease Mother    [name] disease Mother        DIALYSIS   Emphysema Father    Deep vein thrombosis Sister    [name] disease Sister    Lung cancer Sister    Colon polyps Brother    Hypertension Brother    Gout Brother    Colon cancer Neg Hx    Esophageal cancer Neg Hx    Rectal cancer Neg Hx    Prostate cancer Neg Hx     CURRENT MEDICATIONS:  Current Outpatient Medications  Medication Sig Dispense Refill   acetaminophen (TYLENOL) 650 MG CR tablet Take 1,300 mg by mouth  every 8 (eight) hours as needed for pain.     alfuzosin (UROXATRAL) 10 MG 24 hr tablet Take 1 tablet (10 mg total) by mouth at bedtime. 90 tablet 3   ammonium lactate (AMLACTIN) 12 % cream      aspirin 81 MG tablet Take 1 tablet (81 mg total) by mouth daily with breakfast. 30 tablet 3   atorvastatin (LIPITOR) 40 MG tablet Take 1 tablet (40 mg total) by mouth daily. (Patient taking differently: Take 40 mg by mouth at bedtime.) 90 tablet 3   BOOSTRIX 5-2.5-18.5 LF-MCG/0.5 injection      cholecalciferol (VITAMIN D) 1000 UNITS tablet Take 1,000 Units by mouth daily.      Dapagliflozin-sAXagliptin (QTERN) 10-5 MG TABS Take 1 tablet by mouth in the morning. 90 tablet 5   DOCEtaxel (TAXOTERE IV) Inject into the vein every 21 ( twenty-one) days.     docusate sodium (COLACE) 100 MG capsule Take 1 capsule (100 mg total) by mouth 2 (two) times daily. (Patient taking differently: Take 100 mg by mouth daily as needed for mild constipation.) 60 capsule 2   dronabinol (MARINOL) 5 MG capsule Take 1 capsule (5 mg total) by mouth 2 (two) times daily before a meal. 60 capsule 3   escitalopram (LEXAPRO) 10 MG tablet Take 1 tablet (10 mg total) by mouth daily.  90 tablet 1   FARXIGA 10 MG TABS tablet Take 10 mg by mouth daily.     glimepiride (AMARYL) 2 MG tablet Take 2 mg by mouth 2 (two) times daily.     glucose blood test strip 1 each by Other route as needed for other. Use as instructed to test blood sugar daily.  One Touch Verio Test Strips DX: E11.65 100 each 11   lisinopril-hydrochlorothiazide (ZESTORETIC) 20-25 MG tablet Take 1 tablet by mouth daily. 90 tablet 3   loperamide (IMODIUM) 2 MG capsule Take 1 capsule (2 mg total) by mouth as needed for diarrhea or loose stools. 30 capsule 0   magnesium oxide (MAG-OX) 400 (240 Mg) MG tablet Take 1 tablet (400 mg total) by mouth 2 (two) times daily. 60 tablet 2   megestrol (MEGACE) 400 MG/10ML suspension Take 10 mLs (400 mg total) by mouth 2 (two) times daily. 480 mL 4   metFORMIN (GLUCOPHAGE) 1000 MG tablet Take 1 tablet (1,000 mg total) by mouth 2 (two) times daily with a meal. 180 tablet 2   oxyCODONE (ROXICODONE) 5 MG immediate release tablet Take 1 tablet (5 mg total) by mouth every 8 (eight) hours as needed. 10 tablet 0   potassium chloride SA (KLOR-CON M) 20 MEQ tablet Take 1 tablet (20 mEq total) by mouth daily. 30 tablet 6   pramoxine (PROCTOFOAM) 1 % foam Place 1 application. rectally 3 (three) times daily as needed for anal itching. 15 g 0   No current facility-administered medications for this visit.   Facility-Administered Medications Ordered in Other Visits  Medication Dose Route Frequency Provider Last Rate Last Admin   0.9 %  sodium chloride infusion   Intravenous Continuous Andrew Jack, MD 20 mL/hr at 01/08/22 1513 Infusion Verify at 01/08/22 1513    ALLERGIES:  No Known Allergies  PHYSICAL EXAM:  Performance status (ECOG): 1 - Symptomatic but completely ambulatory  There were no vitals filed for this visit.  Wt Readings from Last 3 Encounters:  03/27/22 170 lb 3.2 oz (77.2 kg)  03/19/22 172 lb 9.6 oz (78.3 kg)  02/25/22 172 lb (78 kg)   Physical Exam  Vitals  reviewed.  Constitutional:      Appearance: Normal appearance.  Cardiovascular:     Rate and Rhythm: Normal rate and regular rhythm.     Pulses: Normal pulses.     Heart sounds: Normal heart sounds.  Pulmonary:     Effort: Pulmonary effort is normal.     Breath sounds: Normal breath sounds.  Musculoskeletal:     Right lower leg: No edema.     Left lower leg: No edema.  Neurological:     General: No focal deficit present.     Mental Status: He is alert and oriented to person, place, and time.  Psychiatric:        Mood and Affect: Mood normal.        Behavior: Behavior normal.     LABORATORY DATA:  I have reviewed the labs as listed.     Latest Ref Rng & Units 03/27/2022    9:33 AM 03/19/2022    9:09 AM 02/25/2022    1:16 PM  CBC  WBC 4.0 - 10.5 K/uL 15.0  6.8  6.6   Hemoglobin 13.0 - 17.0 g/dL 9.2  10.2  10.8   Hematocrit 39.0 - 52.0 % 27.1  30.1  33.8   Platelets 150 - 400 K/uL 152  217  226       Latest Ref Rng & Units 03/27/2022    9:33 AM 03/19/2022    9:09 AM 02/25/2022    1:16 PM  CMP  Glucose 70 - 99 mg/dL 117  116  118   BUN 8 - 23 mg/dL _0 Creatinine 0.61 - 1.24 mg/dL 1.37  1.21  1.72   Sodium 135 - 145 mmol/L 133  138  137   Potassium 3.5 - 5.1 mmol/L 2.8  3.0  3.3   Chloride 98 - 111 mmol/L 104  109  109   CO2 22 - 32 mmol/L _1 Calcium 8.9 - 10.3 mg/dL 7.6  8.0  8.5   Total Protein 6.5 - 8.1 g/dL 6.1  6.2    Total Bilirubin 0.3 - 1.2 mg/dL 1.0  0.7    Alkaline Phos 38 - 126 U/L 69  70    AST 15 - 41 U/L 19  23    ALT 0 - 44 U/L 15  18      DIAGNOSTIC IMAGING:  I have independently reviewed the scans and discussed with the patient. No results found.   ASSESSMENT:  Metastatic castration refractory prostate cancer to left iliac lymph node: - History of prostate adenocarcinoma, T2b, Gleason 7, PSA 16.6 - Treated with neoadjuvant hormonal therapy, EBRT followed by brachytherapy with iodine 125 seeds in 2010 - Rising PSA levels in  February 2019 - Bone scan on 08/10/2017 negative.  CTAP on 08/26/2017 shows enlarged left pelvic sidewall lymph node 2.2 cm. - Lupron initiated on 08/10/2019. - CTAP on 03/09/2020 with left internal/external iliac lymph node measuring 2.1 x 2.8 cm (2.1 x 2.2 cm), craniocaudal 4.1 cm, previously 3.4 cm. - Enzalutamide to 160 mg daily started on 04/17/2020.  Discontinued end of October 2022. - PSA on 02/20/2021 (5.2), 07/11/2020 (2.0), 02/08/2020 (3.1) - CTAP on 03/07/2021 with left iliac bifurcation lymph node measuring 3.5 x 2.6 cm (previously 2.8 x 2.0 cm) - Bone scan on 03/07/2021 negative. - Left pelvic lymph node biopsy on 04/08/2021, metastatic prostate cancer. - We have reviewed PSMA PET scan results which showed enlarged left iliac  lymph node 33 mm with SUV 285.  1 mm lymph node and 4 mm node between IVC and aorta.  Activity in the right acetabulum, left iliac bone, left transverse process of T11 vertebral body, spine of the right scapula. - Guardant 360 did not show any targetable mutations.  Did not show MSI high. - NGS testing could not be done because of less than 100 tumor cells. - Abiraterone and prednisone from 07/15/2021 through 10/01/2021 with progression. - Germline mutation Invitae testing was negative. - Cycle 1 of docetaxel on 10/21/2021.   PLAN:  Metastatic castration refractory prostate cancer to the left iliac lymph node: - CT CAP on 01/02/2022 showed unchanged bulky lymph nodes in the left iliac bifurcation.  Bone scan on 02/04/2022 did not show any new changes.  Subtle uptake in the right ribs unchanged. -Reviewed labs today which showed normal LFTs.  Creatinine was normal.  CBC was grossly normal.  PSA is improved to 6.39.  Proceed with cycle 6 at reduced dose.  RTC 4 weeks with repeat CTAP with contrast and bone scan to evaluate response.  2.  Weight loss: - He is eating better and gained about 2 pounds in the last 4 weeks.  Continue Megace twice daily.  3.  Normocytic anemia: - He  has completed 3 doses of Venofer.  Hemoglobin is 9.1.  4.  Bone metastasis: - Will be started after dental evaluation.  5.  Hypomagnesemia/hypokalemia: - Continue potassium 20 mEq daily and magnesium twice daily.  He will receive IV magnesium today.   Orders placed this encounter:  No orders of the defined types were placed in this encounter.    Andrew Jack, MD [place] [phone]   I, Thana Ates, am acting as a scribe for Dr. Derek [name].  I, Andrew Jack MD, have reviewed the above documentation for accuracy and completeness, and I agree with the above.

## 2022-04-28 NOTE — Patient Instructions (Signed)
[facility]  Discharge Instructions: Thank you for choosing [facility] to provide your oncology and hematology care.  If you have a lab appointment with the [facility], please come in thru the Main Entrance and check in at the main information desk.  Wear comfortable clothing and clothing appropriate for easy access to any Portacath or PICC line.   We strive to give you quality time with your provider. You may need to reschedule your appointment if you arrive late (15 or more minutes).  Arriving late affects you and other patients whose appointments are after yours.  Also, if you miss three or more appointments without notifying the office, you may be dismissed from the clinic at the provider's discretion.      For prescription refill requests, have your pharmacy contact our office and allow 72 hours for refills to be completed.    Today you received the following chemotherapy and/or immunotherapy agents Taxotere and magnesium, return as scheduled.   To help prevent nausea and vomiting after your treatment, we encourage you to take your nausea medication as directed.  BELOW ARE SYMPTOMS THAT SHOULD BE REPORTED IMMEDIATELY: *FEVER GREATER THAN 100.4 F (38 C) OR HIGHER *CHILLS OR SWEATING *NAUSEA AND VOMITING THAT IS NOT CONTROLLED WITH YOUR NAUSEA MEDICATION *UNUSUAL SHORTNESS OF BREATH *UNUSUAL BRUISING OR BLEEDING *URINARY PROBLEMS (pain or burning when urinating, or frequent urination) *BOWEL PROBLEMS (unusual diarrhea, constipation, pain near the anus) TENDERNESS IN MOUTH AND THROAT WITH OR WITHOUT PRESENCE OF ULCERS (sore throat, sores in mouth, or a toothache) UNUSUAL RASH, SWELLING OR PAIN  UNUSUAL VAGINAL DISCHARGE OR ITCHING   Items with * indicate a potential emergency and should be followed up as soon as possible or go to the Emergency Department if any problems should occur.  Please show the CHEMOTHERAPY ALERT CARD or IMMUNOTHERAPY ALERT  CARD at check-in to the Emergency Department and triage nurse.  Should you have questions after your visit or need to cancel or reschedule your appointment, please contact [facility] [phone]  and follow the prompts.  Office hours are 8:00 a.m. to 4:30 p.m. Monday - Friday. Please note that voicemails left after 4:00 p.m. may not be returned until the following business day.  We are closed weekends and major holidays. You have access to a nurse at all times for urgent questions. Please call the main number to the clinic [phone] and follow the prompts.  For any non-urgent questions, you may also contact your provider using MyChart. We now offer e-Visits for anyone 34 and older to request care online for non-urgent symptoms. For details visit [email or web address].   Also download the MyChart app! Go to the app store, search "MyChart", open the app, select [facility], and log in with your MyChart username and password.  Masks are optional in the cancer centers. If you would like for your care team to wear a mask while they are taking care of you, please let them know. You may have one support person who is at least 76 years old accompany you for your appointments.

## 2022-04-28 NOTE — Patient Instructions (Signed)
[place] at [facility] Discharge Instructions   You were seen and examined today by Dr. Delton Coombes.  He reviewed part of your lab work which is normal/stable.   He sent prescriptions for potassium and magnesium to your pharmacy. Take as prescribed.   We will proceed with your final treatment today.   We will see you back in 4 weeks. We will repeat a CT scan and bone scan prior to this visit.    Thank you for choosing [place] at [facility] to provide your oncology and hematology care.  To afford each patient quality time with our provider, please arrive at least 15 minutes before your scheduled appointment time.   If you have a lab appointment with the [facility] please come in thru the Main Entrance and check in at the main information desk.  You need to re-schedule your appointment should you arrive 10 or more minutes late.  We strive to give you quality time with our providers, and arriving late affects you and other patients whose appointments are after yours.  Also, if you no show three or more times for appointments you may be dismissed from the clinic at the providers discretion.     Again, thank you for choosing [facility].  Our hope is that these requests will decrease the amount of time that you wait before being seen by our physicians.       _____________________________________________________________  Should you have questions after your visit to [facility], please contact our office at [phone] and follow the prompts.  Our office hours are 8:00 a.m. and 4:30 p.m. Monday - Friday.  Please note that voicemails left after 4:00 p.m. may not be returned until the following business day.  We are closed weekends and major holidays.  You do have access to a nurse 24-7, just call the main number to the clinic [phone] and do not press any options, hold on the line and a nurse will answer the  phone.    For prescription refill requests, have your pharmacy contact our office and allow 72 hours.    Due to Covid, you will need to wear a mask upon entering the hospital. If you do not have a mask, a mask will be given to you at the Main Entrance upon arrival. For doctor visits, patients may have 1 support person age 19 or older with them. For treatment visits, patients can not have anyone with them due to social distancing guidelines and our immunocompromised population.

## 2022-04-28 NOTE — Progress Notes (Signed)
Patient okay for treatment per Dr. Redge Gainer, Magnesium 1.3 and Potassium 3.1 per standing orders patient received 4g of IV magnesium and 40 mEq of potassium chloride. Patient tolerated chemotherapy with no complaints voiced. Side effects with management reviewed understanding verbalized. Port site clean and dry with no bruising or swelling noted at site. Good blood return noted before and after administration of chemotherapy. Band aid applied. Patient left in satisfactory condition with VSS and no s/s of distress noted.

## 2022-04-29 ENCOUNTER — Other Ambulatory Visit: Payer: Self-pay

## 2022-04-30 ENCOUNTER — Inpatient Hospital Stay: Payer: Medicare HMO | Attending: Hematology

## 2022-04-30 VITALS — BP 126/64 | HR 97 | Temp 97.1°F | Resp 18

## 2022-04-30 DIAGNOSIS — Z5111 Encounter for antineoplastic chemotherapy: Secondary | ICD-10-CM | POA: Diagnosis not present

## 2022-04-30 DIAGNOSIS — C61 Malignant neoplasm of prostate: Secondary | ICD-10-CM | POA: Diagnosis not present

## 2022-04-30 DIAGNOSIS — C7951 Secondary malignant neoplasm of bone: Secondary | ICD-10-CM | POA: Diagnosis not present

## 2022-04-30 DIAGNOSIS — D649 Anemia, unspecified: Secondary | ICD-10-CM | POA: Insufficient documentation

## 2022-04-30 DIAGNOSIS — E876 Hypokalemia: Secondary | ICD-10-CM | POA: Insufficient documentation

## 2022-04-30 DIAGNOSIS — Z5189 Encounter for other specified aftercare: Secondary | ICD-10-CM | POA: Diagnosis not present

## 2022-04-30 DIAGNOSIS — Z95828 Presence of other vascular implants and grafts: Secondary | ICD-10-CM

## 2022-04-30 DIAGNOSIS — R69 Illness, unspecified: Secondary | ICD-10-CM | POA: Diagnosis not present

## 2022-04-30 DIAGNOSIS — Z79899 Other long term (current) drug therapy: Secondary | ICD-10-CM | POA: Diagnosis not present

## 2022-04-30 DIAGNOSIS — F17210 Nicotine dependence, cigarettes, uncomplicated: Secondary | ICD-10-CM | POA: Insufficient documentation

## 2022-04-30 MED ORDER — PEGFILGRASTIM-JMDB 6 MG/0.6ML ~~LOC~~ SOSY
6.0000 mg | PREFILLED_SYRINGE | Freq: Once | SUBCUTANEOUS | Status: AC
  Administered 2022-04-30: 6 mg via SUBCUTANEOUS
  Filled 2022-04-30: qty 0.6

## 2022-04-30 NOTE — Progress Notes (Signed)
Patient tolerated injection with no complaints voiced. Site clean and dry with no bruising or swelling noted at site. See MAR for details. Band aid applied.  Patient stable during and after injection. VSS with discharge and left in satisfactory condition with no s/s of distress noted.  

## 2022-04-30 NOTE — Patient Instructions (Signed)
[place]  Discharge Instructions: Thank you for choosing [place] to provide your oncology and hematology care.  If you have a lab appointment with the [facility], please come in thru the Main Entrance and check in at the main information desk.  Wear comfortable clothing and clothing appropriate for easy access to any Portacath or PICC line.   We strive to give you quality time with your provider. You may need to reschedule your appointment if you arrive late (15 or more minutes).  Arriving late affects you and other patients whose appointments are after yours.  Also, if you miss three or more appointments without notifying the office, you may be dismissed from the clinic at the provider's discretion.      For prescription refill requests, have your pharmacy contact our office and allow 72 hours for refills to be completed.    Today you received the following Fulphila, return as scheduled.   To help prevent nausea and vomiting after your treatment, we encourage you to take your nausea medication as directed.  BELOW ARE SYMPTOMS THAT SHOULD BE REPORTED IMMEDIATELY: *FEVER GREATER THAN 100.4 F (38 C) OR HIGHER *CHILLS OR SWEATING *NAUSEA AND VOMITING THAT IS NOT CONTROLLED WITH YOUR NAUSEA MEDICATION *UNUSUAL SHORTNESS OF BREATH *UNUSUAL BRUISING OR BLEEDING *URINARY PROBLEMS (pain or burning when urinating, or frequent urination) *BOWEL PROBLEMS (unusual diarrhea, constipation, pain near the anus) TENDERNESS IN MOUTH AND THROAT WITH OR WITHOUT PRESENCE OF ULCERS (sore throat, sores in mouth, or a toothache) UNUSUAL RASH, SWELLING OR PAIN  UNUSUAL VAGINAL DISCHARGE OR ITCHING   Items with * indicate a potential emergency and should be followed up as soon as possible or go to the Emergency Department if any problems should occur.  Please show the CHEMOTHERAPY ALERT CARD or IMMUNOTHERAPY ALERT CARD at check-in to the Emergency Department and  triage nurse.  Should you have questions after your visit or need to cancel or reschedule your appointment, please contact [place] [phone]  and follow the prompts.  Office hours are 8:00 a.m. to 4:30 p.m. Monday - Friday. Please note that voicemails left after 4:00 p.m. may not be returned until the following business day.  We are closed weekends and major holidays. You have access to a nurse at all times for urgent questions. Please call the main number to the clinic [phone] and follow the prompts.  For any non-urgent questions, you may also contact your provider using MyChart. We now offer e-Visits for anyone 46 and older to request care online for non-urgent symptoms. For details visit [email or web address].   Also download the MyChart app! Go to the app store, search "MyChart", open the app, select [place], and log in with your MyChart username and password.  Masks are optional in the cancer centers. If you would like for your care team to wear a mask while they are taking care of you, please let them know. You may have one support person who is at least 76 years old accompany you for your appointments.

## 2022-05-06 ENCOUNTER — Ambulatory Visit: Payer: Self-pay | Admitting: *Deleted

## 2022-05-06 NOTE — Patient Outreach (Signed)
  Care Coordination   05/06/2022 Name: Andrew [name] MRN: [id number] DOB: September 15, 1945   Care Coordination Outreach Attempts:  An unsuccessful telephone outreach was attempted today to offer the patient information about available care coordination services as a benefit of their health plan.   Follow Up Plan:  Additional outreach attempts will be made to offer the patient care coordination information and services.   Encounter Outcome:  No Answer  Care Coordination Interventions Activated:  No   Care Coordination Interventions:  No, not indicated    SIG Theordore Cisnero L. Lavina Hamman, RN, BSN, [place] Coordinator Office number [phone]

## 2022-05-14 ENCOUNTER — Ambulatory Visit: Payer: Self-pay | Admitting: *Deleted

## 2022-05-14 NOTE — Patient Outreach (Signed)
  Care Coordination   05/14/2022 Name: Andrew [name] MRN: [id number] DOB: [birth year]   Care Coordination Outreach Attempts:  A second unsuccessful outreach was attempted today to offer the patient with information about available care coordination services as a benefit of their health plan.     Follow Up Plan:  Additional outreach attempts will be made to offer the patient care coordination information and services.   Encounter Outcome:  No Answer  Care Coordination Interventions Activated:  No   Care Coordination Interventions:  No, not indicated    Nada Godley L. Lavina Hamman, RN, BSN, [place] Coordinator Office number [phone]

## 2022-05-15 ENCOUNTER — Telehealth: Payer: Self-pay | Admitting: Pharmacist

## 2022-05-15 ENCOUNTER — Telehealth: Payer: Self-pay | Admitting: Hematology

## 2022-05-15 ENCOUNTER — Telehealth: Payer: Medicare HMO

## 2022-05-15 DIAGNOSIS — E118 Type 2 diabetes mellitus with unspecified complications: Secondary | ICD-10-CM

## 2022-05-15 MED ORDER — QTERN 10-5 MG PO TABS: 1.0000 | ORAL_TABLET | Freq: Every morning | ORAL | 5 refills | Status: DC

## 2022-05-15 NOTE — Telephone Encounter (Signed)
      05/15/2022 Name: Andrew [name] MRN: [id number] DOB: 05/29/1946   Referred by: [name], MD Reason for referral : Appointment and Medication Assistance (AZ&me Re-enroll Patient Assistance [facility]))   Please re-enroll for az&me QTERN if still available.  If QTERN is not available, please completed PAP for Farxiga '10mg'$  daily.  Escribed QTERN to [facility] pharmacy with refills.  Patient has been notified that paperwork is being mailed and we may have to switch medications. Patient stable on current regimen   Thank you! Regina Eck, PharmD, BCPS Clinical Pharmacist, [place]  II Phone [phone]

## 2022-05-17 ENCOUNTER — Other Ambulatory Visit: Payer: Self-pay

## 2022-05-19 NOTE — Telephone Encounter (Signed)
Okay please schedule appointment?

## 2022-05-20 ENCOUNTER — Inpatient Hospital Stay: Payer: Medicare HMO

## 2022-05-20 ENCOUNTER — Encounter (HOSPITAL_COMMUNITY): Payer: Self-pay

## 2022-05-20 ENCOUNTER — Encounter (HOSPITAL_COMMUNITY)
Admission: RE | Admit: 2022-05-20 | Discharge: 2022-05-20 | Disposition: A | Discharge status: Home / Self Care | Payer: Medicare HMO | Source: Ambulatory Visit | Attending: Hematology | Admitting: Hematology

## 2022-05-20 DIAGNOSIS — C775 Secondary and unspecified malignant neoplasm of intrapelvic lymph nodes: Secondary | ICD-10-CM | POA: Insufficient documentation

## 2022-05-20 DIAGNOSIS — D649 Anemia, unspecified: Secondary | ICD-10-CM | POA: Diagnosis not present

## 2022-05-20 DIAGNOSIS — R69 Illness, unspecified: Secondary | ICD-10-CM | POA: Diagnosis not present

## 2022-05-20 DIAGNOSIS — Z95828 Presence of other vascular implants and grafts: Secondary | ICD-10-CM | POA: Insufficient documentation

## 2022-05-20 DIAGNOSIS — C61 Malignant neoplasm of prostate: Secondary | ICD-10-CM | POA: Insufficient documentation

## 2022-05-20 DIAGNOSIS — Z5189 Encounter for other specified aftercare: Secondary | ICD-10-CM | POA: Diagnosis not present

## 2022-05-20 DIAGNOSIS — C7951 Secondary malignant neoplasm of bone: Secondary | ICD-10-CM | POA: Diagnosis not present

## 2022-05-20 DIAGNOSIS — Z79899 Other long term (current) drug therapy: Secondary | ICD-10-CM | POA: Diagnosis not present

## 2022-05-20 DIAGNOSIS — E876 Hypokalemia: Secondary | ICD-10-CM | POA: Diagnosis not present

## 2022-05-20 DIAGNOSIS — Z5111 Encounter for antineoplastic chemotherapy: Secondary | ICD-10-CM | POA: Diagnosis not present

## 2022-05-20 LAB — CBC WITH DIFFERENTIAL/PLATELET
Abs Immature Granulocytes: 0.04 10*3/uL (ref 0.00–0.07)
Basophils Absolute: 0.1 10*3/uL (ref 0.0–0.1)
Basophils Relative: 1 %
Eosinophils Absolute: 0.2 10*3/uL (ref 0.0–0.5)
Eosinophils Relative: 2 %
HCT: 30.1 % — ABNORMAL LOW (ref 39.0–52.0)
Hemoglobin: 9.7 g/dL — ABNORMAL LOW (ref 13.0–17.0)
Immature Granulocytes: 1 %
Lymphocytes Relative: 24 %
Lymphs Abs: 1.9 10*3/uL (ref 0.7–4.0)
MCH: 32 pg (ref 26.0–34.0)
MCHC: 32.2 g/dL (ref 30.0–36.0)
MCV: 99.3 fL (ref 80.0–100.0)
Monocytes Absolute: 0.5 10*3/uL (ref 0.1–1.0)
Monocytes Relative: 7 %
Neutro Abs: 4.9 10*3/uL (ref 1.7–7.7)
Neutrophils Relative %: 65 %
Platelets: 389 10*3/uL (ref 150–400)
RBC: 3.03 MIL/uL — ABNORMAL LOW (ref 4.22–5.81)
RDW: 14.3 % (ref 11.5–15.5)
WBC: 7.6 10*3/uL (ref 4.0–10.5)
nRBC: 0 % (ref 0.0–0.2)

## 2022-05-20 LAB — COMPREHENSIVE METABOLIC PANEL
ALT: 26 U/L (ref 0–44)
AST: 25 U/L (ref 15–41)
Albumin: 2.5 g/dL — ABNORMAL LOW (ref 3.5–5.0)
Alkaline Phosphatase: 71 U/L (ref 38–126)
Anion gap: 6 (ref 5–15)
BUN: 20 mg/dL (ref 8–23)
CO2: 21 mmol/L — ABNORMAL LOW (ref 22–32)
Calcium: 8.4 mg/dL — ABNORMAL LOW (ref 8.9–10.3)
Chloride: 108 mmol/L (ref 98–111)
Creatinine, Ser: 1.32 mg/dL — ABNORMAL HIGH (ref 0.61–1.24)
GFR, Estimated: 56 mL/min — ABNORMAL LOW (ref >60)
Glucose, Bld: 119 mg/dL — ABNORMAL HIGH (ref 70–99)
Potassium: 4.3 mmol/L (ref 3.5–5.1)
Sodium: 135 mmol/L (ref 135–145)
Total Bilirubin: 0.4 mg/dL (ref 0.3–1.2)
Total Protein: 7.1 g/dL (ref 6.5–8.1)

## 2022-05-20 LAB — MAGNESIUM: Magnesium: 1.6 mg/dL — ABNORMAL LOW (ref 1.7–2.4)

## 2022-05-20 LAB — PSA: Prostatic Specific Antigen: 9.5 ng/mL — ABNORMAL HIGH (ref 0.00–4.00)

## 2022-05-20 MED ORDER — TECHNETIUM TC 99M MEDRONATE IV KIT
20.0000 | PACK | Freq: Once | INTRAVENOUS | Status: AC | PRN
  Administered 2022-05-20: 20.5 via INTRAVENOUS

## 2022-05-20 NOTE — Telephone Encounter (Signed)
Left message for patient to call back to schedule his 6 month ck with Dr Livia Snellen.

## 2022-05-21 ENCOUNTER — Other Ambulatory Visit: Payer: Self-pay

## 2022-05-23 ENCOUNTER — Ambulatory Visit (HOSPITAL_COMMUNITY): Payer: Medicare HMO

## 2022-05-26 ENCOUNTER — Inpatient Hospital Stay: Payer: Medicare HMO | Admitting: Hematology

## 2022-05-27 ENCOUNTER — Ambulatory Visit: Payer: Self-pay | Admitting: *Deleted

## 2022-05-27 NOTE — Patient Outreach (Signed)
  Care Coordination   Follow Up Visit Note   05/29/2022 Name: Andrew [name] MRN: [id number] DOB: 10/22/1945  Andrew [name] is a 76 y.o. year old male who sees [name], MD for primary care. I spoke with  [name] by phone today.  What matters to the patients health and wellness today?  Medication assistance - received paper from office pharmacist related to [place] but has not return forms to [place] [employer]   Encouraged to do so   Home improvements - Not completed. [place] guide Wandalee Ferdinand has assisted but patient has not outreach to resources. RN CM called with him today to leave a message for Charm Rings [phone] Requested a return call to RN CM or patient Had patient to write down Charm Rings phone number - He called out the number as he reported his daughter wrote it down  Message to [facility] care guide for assist  Appetite poor, loss weight- Receives ensure or boost from cancer center or purchases them off of his over the counter benefit card   Goals Addressed               This Visit's Progress     Patient Stated     Resource for housing improvement [facility]) (pt-stated)   Not on track     Care Coordination Interventions: Collaborated with [facility] care guide  regarding possible assist for home improvement needs now for roof in back rooms and furnace of mobile home Reviewed [place] note indicating information provided, services rendered and connections attempt to the [employer] on Aging and community development Completed a conference call with the patient to Charm Rings at the [employer] on Aging and community development Assisted with having patient have his daughter write down the number for Charm Rings for him to call her Confirmed with [place] that Charm Rings has been attempting to reach patient Encouraged him to outreach for assistance         SDOH assessments and interventions  completed:  Yes     Care Coordination Interventions:  Yes, provided   Follow up plan: Follow up call scheduled for 06/27/22 3 pm    Encounter Outcome:  Pt. Visit Completed   Paighton Godette L. Lavina Hamman, RN, BSN, [place] Coordinator Office number [phone]

## 2022-05-29 ENCOUNTER — Telehealth: Payer: Self-pay | Admitting: *Deleted

## 2022-05-29 NOTE — Patient Outreach (Signed)
  Care Coordination   Collaboration with [place] guide  Visit Note   05/29/2022 Name: Andrew [name] MRN: [id number] DOB: [birth year]  QUINNLAN ABRUZZO is a 76 y.o. year old male who sees [name], MD for primary care. I  received a message from [facility] care guide   What matters to the patients health and wellness today?  received services from [facility] care guide but poor follow up by pt & family to [place]    The message [facility] care guide received from [facility] on aging staff "28 May 2022 Note added to Case by Charm Rings at ([place]) on 2022-05-28 at 11:45 am Interaction Type: Phone Date: 2022-05-28 Duration: 27mNote: Called client. This is the 3rd phone call. No answer. Left voicemail to see if client would like to apply for the weatherization assistance program. Left my name and number."   Goals Addressed               This Visit's Progress     Patient Stated     Resource for housing improvement ([facility] (pt-stated)   Not on track     Care Coordination Interventions: [facility]  regarding possible assist for home improvement needs now for roof in back rooms and furnace of mobile home Confirmed with T[place]that NCharm Ringshas been attempting to reach patient as evidence by a note sent to MWandalee Ferdinandby NCathlean Marseillesstating a 3rd call attempt to patient made on 05/28/22 with NCathlean Marseillesnumber left  poor follow up by pt & family to [place]         SDOH assessments and interventions completed:  Yes  SDOH Interventions Today    Flowsheet Row Most Recent Value  SDOH Interventions   Housing Interventions NLutherReferral  [received [facility] but poor follow up by pt & family to [facility] NWinstonCoordination Interventions:  Yes, provided   Follow up plan: Follow up call scheduled for 06/27/22     Encounter Outcome:  Pt. Visit Completed   Almando Brawley L. GLavina Hamman RN, BSN, CSt. PaulCoordinator Office number [phone]

## 2022-05-29 NOTE — Patient Instructions (Signed)
Visit Information  Thank you for taking time to visit with me today. Please don't hesitate to contact me if I can be of assistance to you.   Following are the goals we discussed today:   Goals Addressed               This Visit's Progress     Patient Stated     Resource for housing improvement [facility]) (pt-stated)   Not on track     Care Coordination Interventions: Collaborated with [facility] care guide  regarding possible assist for home improvement needs now for roof in back rooms and furnace of mobile home Confirmed with [place] that Charm Rings has been attempting to reach patient as evidence by a note sent to Wandalee Ferdinand by Cathlean Marseilles stating a 3rd call attempt to patient made on 05/28/22 with Cathlean Marseilles number left  poor follow up by pt & family to [place] next appointment is by telephone on 06/27/22 at 3 pm  Please call the care guide team at [phone] if you need to cancel or reschedule your appointment.   If you are experiencing a Mental Health or [place] or need someone to talk to, please call the Suicide and Crisis Lifeline: 988 call the [place] National Suicide Prevention Lifeline: 6231349907 or TTY: 619-081-1503 TTY 915-811-2284) to talk to a trained counselor call 1-800-273-TALK (toll free, 24 hour hotline) call the [facility]: (786) 884-6117 call 911   The patient verbalized understanding of instructions, educational materials, and care plan provided today and DECLINED offer to receive copy of patient instructions, educational materials, and care plan.   The patient has been provided with contact information for the care management team and has been advised to call with any health related questions or concerns.   Akosua Constantine L. Lavina Hamman, RN, BSN, [place] Coordinator Office number [phone]

## 2022-05-29 NOTE — Patient Instructions (Signed)
Visit Information  Thank you for taking time to visit with me today. Please don't hesitate to contact me if I can be of assistance to you.   Following are the goals we discussed today:   Goals Addressed               This Visit's Progress     Patient Stated     Resource for housing improvement [facility]) (pt-stated)   Not on track     Care Coordination Interventions: Collaborated with [facility] care guide  regarding possible assist for home improvement needs now for roof in back rooms and furnace of mobile home Reviewed [place] note indicating information provided, services rendered and connections attempt to the [employer] on Aging and community development Completed a conference call with the patient to Charm Rings at the [employer] on Aging and community development Assisted with having patient have his daughter write down the number for Charm Rings for him to call her Confirmed with [place] that Charm Rings has been attempting to reach patient Encouraged him to outreach for assistance         Our next appointment is by telephone on 06/27/22 at 3 pm  Please call the care guide team at [phone] if you need to cancel or reschedule your appointment.   If you are experiencing a Mental Health or [place] or need someone to talk to, please call the Suicide and Crisis Lifeline: 988 call the [place] National Suicide Prevention Lifeline: 765-661-9546 or TTY: 615-879-5124 TTY 306-771-0236) to talk to a trained counselor call 1-800-273-TALK (toll free, 24 hour hotline) call the [facility]: (920)200-1358 call 911   The patient verbalized understanding of instructions, educational materials, and care plan provided today and DECLINED offer to receive copy of patient instructions, educational materials, and care plan.   The patient has been provided with contact information for  the care management team and has been advised to call with any health related questions or concerns.   Mannie Ohlin L. Lavina Hamman, RN, BSN, [place] Coordinator Office number [phone]

## 2022-05-29 NOTE — Telephone Encounter (Signed)
MAILED APPLICATION TO PT'S HOME

## 2022-06-02 ENCOUNTER — Ambulatory Visit (HOSPITAL_COMMUNITY)
Admission: RE | Admit: 2022-06-02 | Discharge: 2022-06-02 | Disposition: A | Discharge status: Home / Self Care | Payer: Medicare HMO | Source: Ambulatory Visit | Attending: Hematology | Admitting: Hematology

## 2022-06-02 DIAGNOSIS — C61 Malignant neoplasm of prostate: Secondary | ICD-10-CM | POA: Diagnosis not present

## 2022-06-02 DIAGNOSIS — N281 Cyst of kidney, acquired: Secondary | ICD-10-CM | POA: Diagnosis not present

## 2022-06-02 DIAGNOSIS — N3289 Other specified disorders of bladder: Secondary | ICD-10-CM | POA: Diagnosis not present

## 2022-06-02 MED ORDER — IOHEXOL 300 MG/ML  SOLN
100.0000 mL | Freq: Once | INTRAMUSCULAR | Status: AC | PRN
  Administered 2022-06-02: 100 mL via INTRAVENOUS

## 2022-06-02 MED ORDER — IOHEXOL 9 MG/ML PO SOLN: 500.0000 mL | ORAL | Status: AC

## 2022-06-02 MED ORDER — IOHEXOL 9 MG/ML PO SOLN
ORAL | Status: AC
  Filled 2022-06-02: qty 1000

## 2022-06-05 ENCOUNTER — Inpatient Hospital Stay: Payer: Medicare HMO | Attending: Hematology | Admitting: Hematology

## 2022-06-05 VITALS — BP 111/75 | HR 97 | Temp 99.0°F | Resp 17 | Wt 174.1 lb

## 2022-06-05 DIAGNOSIS — F17210 Nicotine dependence, cigarettes, uncomplicated: Secondary | ICD-10-CM | POA: Diagnosis not present

## 2022-06-05 DIAGNOSIS — C7951 Secondary malignant neoplasm of bone: Secondary | ICD-10-CM | POA: Insufficient documentation

## 2022-06-05 DIAGNOSIS — I10 Essential (primary) hypertension: Secondary | ICD-10-CM | POA: Diagnosis not present

## 2022-06-05 DIAGNOSIS — Z5111 Encounter for antineoplastic chemotherapy: Secondary | ICD-10-CM | POA: Diagnosis not present

## 2022-06-05 DIAGNOSIS — R69 Illness, unspecified: Secondary | ICD-10-CM | POA: Diagnosis not present

## 2022-06-05 DIAGNOSIS — Z923 Personal history of irradiation: Secondary | ICD-10-CM | POA: Insufficient documentation

## 2022-06-05 DIAGNOSIS — Z7982 Long term (current) use of aspirin: Secondary | ICD-10-CM | POA: Insufficient documentation

## 2022-06-05 DIAGNOSIS — Z9079 Acquired absence of other genital organ(s): Secondary | ICD-10-CM | POA: Diagnosis not present

## 2022-06-05 DIAGNOSIS — D649 Anemia, unspecified: Secondary | ICD-10-CM | POA: Insufficient documentation

## 2022-06-05 DIAGNOSIS — E876 Hypokalemia: Secondary | ICD-10-CM | POA: Insufficient documentation

## 2022-06-05 DIAGNOSIS — D509 Iron deficiency anemia, unspecified: Secondary | ICD-10-CM | POA: Diagnosis not present

## 2022-06-05 DIAGNOSIS — Z79899 Other long term (current) drug therapy: Secondary | ICD-10-CM | POA: Insufficient documentation

## 2022-06-05 DIAGNOSIS — C775 Secondary and unspecified malignant neoplasm of intrapelvic lymph nodes: Secondary | ICD-10-CM | POA: Diagnosis not present

## 2022-06-05 DIAGNOSIS — C61 Malignant neoplasm of prostate: Secondary | ICD-10-CM | POA: Insufficient documentation

## 2022-06-05 MED ORDER — MEGESTROL ACETATE 400 MG/10ML PO SUSP: 400.0000 mg | Freq: Two times a day (BID) | ORAL | 4 refills | Status: DC

## 2022-06-05 NOTE — Patient Instructions (Addendum)
[place] at [facility] Discharge Instructions   You were seen and examined today by Dr. Delton Coombes.  He reviewed the results of your CT scan and bone scan which are stable.   We sent a refill for the appetite medication to your pharmacy.  We will plan to restart treatment next week.   Return as scheduled.    Thank you for choosing [place] at [facility] to provide your oncology and hematology care.  To afford each patient quality time with our provider, please arrive at least 15 minutes before your scheduled appointment time.   If you have a lab appointment with the [facility] please come in thru the Main Entrance and check in at the main information desk.  You need to re-schedule your appointment should you arrive 10 or more minutes late.  We strive to give you quality time with our providers, and arriving late affects you and other patients whose appointments are after yours.  Also, if you no show three or more times for appointments you may be dismissed from the clinic at the providers discretion.     Again, thank you for choosing [facility].  Our hope is that these requests will decrease the amount of time that you wait before being seen by our physicians.       _____________________________________________________________  Should you have questions after your visit to [facility], please contact our office at [phone] and follow the prompts.  Our office hours are 8:00 a.m. and 4:30 p.m. Monday - Friday.  Please note that voicemails left after 4:00 p.m. may not be returned until the following business day.  We are closed weekends and major holidays.  You do have access to a nurse 24-7, just call the main number to the clinic [phone] and do not press any options, hold on the line and a nurse will answer the phone.    For prescription refill requests, have your pharmacy contact our office and allow  72 hours.    Due to Covid, you will need to wear a mask upon entering the hospital. If you do not have a mask, a mask will be given to you at the Main Entrance upon arrival. For doctor visits, patients may have 1 support person age 27 or older with them. For treatment visits, patients can not have anyone with them due to social distancing guidelines and our immunocompromised population.

## 2022-06-05 NOTE — Progress Notes (Signed)
Andrew [name], [place] [address]   CLINIC:  Medical Oncology/Hematology  PCP:  [name], MD [address] 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 Report date is 05/07/2021.  The Multi-Cancer + RNA Panel offered by [facility] includes sequencing and/or deletion/duplication analysis of the following 84 genes:  AIP*, ALK, APC*, ATM*, AXIN2*, BAP1*, BARD1*, BLM*, BMPR1A*, BRCA1*, BRCA2*, BRIP1*, CASR, CDC73*, CDH1*, CDK4, CDKN1B*, CDKN1C*, CDKN2A, CEBPA, CHEK2*, CTNNA1*, DICER1*, DIS3L2*, EGFR, EPCAM, FH*, FLCN*, GATA2*, GPC3, GREM1, HOXB13, HRAS, KIT, MAX*, MEN1*, MET, MITF, MLH1*, MSH2*, MSH3*, MSH6*, MUTYH*, NBN*, NF1*, NF2*, NTHL1*, PALB2*, PDGFRA, PHOX2B, PMS2*, POLD1*, POLE*, POT1*, PRKAR1A*, PTCH1*, PTEN*, RAD50*, RAD51C*, RAD51D*, RB1*, RECQL4, RET, RUNX1*, SDHA*, SDHAF2*, SDHB*, SDHC*, SDHD*, SMAD4*, SMARCA4*, SMARCB1*, SMARCE1*, STK11*, SUFU*, TERC, TERT, TMEM127*, Tp53*, TSC1*, TSC2*, VHL*, WRN*, and WT1.  RNA analysis is performed for * genes.   10/21/2021 - 02/12/2022 Chemotherapy   Patient is on Treatment Plan : PROSTATE Docetaxel + Prednisone q21d     10/21/2021 -  Chemotherapy   Patient is on Treatment Plan : PROSTATE Docetaxel (75) + Prednisone q21d       CANCER STAGING:  Cancer Staging  Prostate cancer [facility]) Staging form: Prostate,  [name] 7th Edition - Clinical stage from 04/02/2021: rT2b, N1, PSA: 10 to 19, Gleason 7 - Unsigned   INTERVAL HISTORY:  Andrew [name], a 76 y.o. male, seen for follow-up of metastatic prostate cancer.  He has completed 6 cycles of docetaxel.  He reports that he is eating well.  Energy levels are 65%.  He gained 2 pounds since last visit.  Denies any tingling or numbness in extremities.  REVIEW OF SYSTEMS:  Review of Systems  Genitourinary:  Positive for dysuria.   All other systems reviewed and are negative.   PAST MEDICAL/SURGICAL HISTORY:  Past Medical History:  Diagnosis Date   AKI (acute kidney injury) ([place]) 10/04/2021   Anemia    BPH (benign prostatic hyperplasia)    Sees Dr [name]   Cataract    Diabetes mellitus without complication [facility])    Educated about COVID-19 virus infection 07/18/2019   Genetic testing 05/09/2021   [facility] Multi-Cancer Panel was Negative. Report date is 05/07/2021.  The Multi-Cancer + RNA Panel offered by [facility] includes sequencing and/or deletion/duplication analysis of the following 84 genes:  AIP*, ALK, APC*, ATM*, AXIN2*, BAP1*, BARD1*, BLM*, BMPR1A*, BRCA1*, BRCA2*, BRIP1*, CASR, CDC73*, CDH1*, CDK4, CDKN1B*, CDKN1C*, CDKN2A, CEBPA, CHEK2*, CTNNA1*, DICER1*, DIS3L2*, EGFR, EPCAM, FH*, F   Hyperlipidemia    Hypertension    Low serum vitamin D    Port-A-Cath in place 10/09/2021   Prostate cancer [facility]) 2008   w/seed implantation and radiation   Past Surgical History:  Procedure Laterality Date   COLONOSCOPY  2015,2018   PORTACATH PLACEMENT Right 10/17/2021   Procedure: INSERTION PORT-A-CATH, [name];  Surgeon: [name], DO;  Location: [name];  Service: General;  Laterality: Right;  pt needs to have glucose checked AM of surgery   PROSTATE SURGERY  2008   seed implant    SOCIAL HISTORY:  Social History   Socioeconomic History   Marital status: Married    Spouse name: [place]   Number of children: 2   Years of education: 12   Highest  education level: 12th grade  Occupational History   Occupation: [name]    Comment: Retired Geologist, engineering  Tobacco Use   Smoking status: Every Day    Packs/day: 1.00    Years: 55.00    Total pack years: 55.00    Types: Cigarettes    Start date: 06/30/1965   Smokeless tobacco: Never   Tobacco comments:    Has tried nicotine replacement gum  Vaping Use   Vaping Use: Never used  Substance and Sexual Activity   Alcohol use: No   Drug use: No   Sexual activity: Yes  Other Topics Concern   Not on file  Social History Narrative   Retired, lives at home with wife [name], two grown children, enjoys gardening     Social Determinants of Health   Financial Resource Strain: Medium Risk (04/14/2022)   Overall Financial Resource Strain (CARDIA)    Difficulty of Paying Living Expenses: Somewhat hard  Food Insecurity: No Food Insecurity (03/07/2022)   Hunger Vital Sign    Worried About Running Out of Food in the Last Year: Never true    [place] in the Last Year: Never true  Transportation Needs: No Transportation Needs (03/07/2022)   PRAPARE - Hydrologist (Medical): No    Lack of Transportation (Non-Medical): No  Physical Activity: Inactive (01/27/2022)   Exercise Vital Sign    Days of Exercise per Week: 0 days    Minutes of Exercise per Session: 0 min  Stress: No Stress Concern Present (03/07/2022)   [place]    Feeling of Stress : Only a little  Social Connections: Moderately Integrated (01/27/2022)   Social Connection and Isolation Panel [NHANES]    Frequency of Communication with Friends and Family: More than three times a week    Frequency of Social Gatherings with Friends and Family: Once a week    Attends Religious Services: More than 4 times per year    Active Member of [employer] or Organizations: No    Attends Archivist Meetings: Never    Marital Status: Married   Human resources officer Violence: Not At Risk (01/27/2022)   Humiliation, Afraid, Rape, and Kick questionnaire    Fear of Current or Ex-Partner: No    Emotionally Abused: No    Physically Abused: No    Sexually Abused: No    FAMILY HISTORY:  Family History  Problem Relation Age of Onset   Diabetes Mother    Heart disease Mother    Kidney disease Mother        DIALYSIS   Emphysema Father    Deep vein thrombosis Sister    Kidney disease Sister    Lung cancer Sister    Colon polyps Brother    Hypertension Brother    Gout Brother    Colon cancer Neg Hx    Esophageal cancer Neg Hx    Rectal cancer Neg Hx    Prostate cancer Neg Hx     CURRENT MEDICATIONS:  Current Outpatient Medications  Medication Sig Dispense Refill   acetaminophen (TYLENOL) 650 MG  CR tablet Take 1,300 mg by mouth every 8 (eight) hours as needed for pain.     alfuzosin (UROXATRAL) 10 MG 24 hr tablet Take 1 tablet (10 mg total) by mouth at bedtime. 90 tablet 3   ammonium lactate (AMLACTIN) 12 % cream      aspirin 81 MG tablet Take 1 tablet (81 mg total) by mouth daily with breakfast. 30 tablet 3   atorvastatin (LIPITOR) 40 MG tablet Take 1 tablet (40 mg total) by mouth daily. (Patient taking differently: Take 40 mg by mouth at bedtime.) 90 tablet 3   BOOSTRIX 5-2.5-18.5 LF-MCG/0.5 injection      cholecalciferol (VITAMIN D) 1000 UNITS tablet Take 1,000 Units by mouth daily.      Dapagliflozin-sAXagliptin (QTERN) 10-5 MG TABS Take 1 tablet by mouth in the morning. 90 tablet 5   DOCEtaxel (TAXOTERE IV) Inject into the vein every 21 ( twenty-one) days.     docusate sodium (COLACE) 100 MG capsule Take 1 capsule (100 mg total) by mouth 2 (two) times daily. (Patient taking differently: Take 100 mg by mouth daily as needed for mild constipation.) 60 capsule 2   escitalopram (LEXAPRO) 10 MG tablet Take 1 tablet (10 mg total) by mouth daily. 90 tablet 1   glimepiride (AMARYL) 2 MG tablet Take 2 mg by mouth 2 (two) times daily.      glucose blood test strip 1 each by Other route as needed for other. Use as instructed to test blood sugar daily.  One Touch Verio Test Strips DX: E11.65 100 each 11   lisinopril-hydrochlorothiazide (ZESTORETIC) 20-25 MG tablet Take 1 tablet by mouth daily. 90 tablet 3   loperamide (IMODIUM) 2 MG capsule Take 1 capsule (2 mg total) by mouth as needed for diarrhea or loose stools. 30 capsule 0   magnesium oxide (MAG-OX) 400 (240 Mg) MG tablet Take 1 tablet (400 mg total) by mouth 2 (two) times daily. 60 tablet 2   metFORMIN (GLUCOPHAGE) 1000 MG tablet Take 1 tablet (1,000 mg total) by mouth 2 (two) times daily with a meal. 180 tablet 2   oxyCODONE (ROXICODONE) 5 MG immediate release tablet Take 1 tablet (5 mg total) by mouth every 8 (eight) hours as needed. 10 tablet 0   potassium chloride SA (KLOR-CON M) 20 MEQ tablet Take 1 tablet (20 mEq total) by mouth daily. 30 tablet 6   pramoxine (PROCTOFOAM) 1 % foam Place 1 application. rectally 3 (three) times daily as needed for anal itching. 15 g 0   megestrol (MEGACE) 400 MG/10ML suspension Take 10 mLs (400 mg total) by mouth 2 (two) times daily. 480 mL 4   No current facility-administered medications for this visit.   Facility-Administered Medications Ordered in Other Visits  Medication Dose Route Frequency Provider Last Rate Last Admin   0.9 %  sodium chloride infusion   Intravenous Continuous Derek Jack, MD 20 mL/hr at 01/08/22 1513 Infusion Verify at 01/08/22 1513    ALLERGIES:  No Known Allergies  PHYSICAL EXAM:  Performance status (ECOG): 1 - Symptomatic but completely ambulatory  Vitals:   06/05/22 1305  BP: 111/75  Pulse: 97  Resp: 17  Temp: 99 F (37.2 C)  SpO2: 100%    Wt Readings from Last 3 Encounters:  06/05/22 174 lb 1.6 oz (79 kg)  04/28/22 172 lb (78 kg)  03/27/22 170 lb 3.2 oz (77.2 kg)   Physical Exam Vitals reviewed.  Constitutional:      Appearance: Normal appearance.  Cardiovascular:  Rate and  Rhythm: Normal rate and regular rhythm.     Pulses: Normal pulses.     Heart sounds: Normal heart sounds.  Pulmonary:     Effort: Pulmonary effort is normal.     Breath sounds: Normal breath sounds.  Musculoskeletal:     Right lower leg: No edema.     Left lower leg: No edema.  Neurological:     General: No focal deficit present.     Mental Status: He is alert and oriented to person, place, and time.  Psychiatric:        Mood and Affect: Mood normal.        Behavior: Behavior normal.     LABORATORY DATA:  I have reviewed the labs as listed.     Latest Ref Rng & Units 05/20/2022    9:39 AM 04/28/2022    9:39 AM 03/27/2022    9:33 AM  CBC  WBC 4.0 - 10.5 K/uL 7.6  7.2  15.0   Hemoglobin 13.0 - 17.0 g/dL 9.7  9.1  9.2   Hematocrit 39.0 - 52.0 % 30.1  27.2  27.1   Platelets 150 - 400 K/uL 389  225  152       Latest Ref Rng & Units 05/20/2022    9:39 AM 04/28/2022    9:39 AM 03/27/2022    9:33 AM  CMP  Glucose 70 - 99 mg/dL 119  107  117   BUN 8 - 23 mg/dL _0 Creatinine 0.61 - 1.24 mg/dL 1.32  1.20  1.37   Sodium 135 - 145 mmol/L 135  137  133   Potassium 3.5 - 5.1 mmol/L 4.3  3.1  2.8   Chloride 98 - 111 mmol/L 108  105  104   CO2 22 - 32 mmol/L _1 Calcium 8.9 - 10.3 mg/dL 8.4  8.2  7.6   Total Protein 6.5 - 8.1 g/dL 7.1  6.3  6.1   Total Bilirubin 0.3 - 1.2 mg/dL 0.4  0.7  1.0   Alkaline Phos 38 - 126 U/L 71  71  69   AST 15 - 41 U/L _2 ALT 0 - 44 U/L _3 DIAGNOSTIC IMAGING:  I have independently reviewed the scans and discussed with the patient. CT Abdomen Pelvis W Contrast  Result Date: 06/02/2022 CLINICAL DATA:  History of prostate cancer, follow-up. * Tracking Code: BO * EXAM: CT ABDOMEN AND PELVIS WITH CONTRAST TECHNIQUE: Multidetector CT imaging of the abdomen and pelvis was performed using the standard protocol following bolus administration of intravenous contrast. RADIATION DOSE REDUCTION: This exam was performed  according to the departmental dose-optimization program which includes automated exposure control, adjustment of the mA and/or kV according to patient size and/or use of iterative reconstruction technique. CONTRAST:  143m OMNIPAQUE IOHEXOL 300 MG/ML  SOLN COMPARISON:  Multiple priors including nuclear medicine bone scan May 20, 2022 and CT abdomen pelvis January 02, 2022. FINDINGS: Lower chest: No acute abnormality. Calcifications of the mitral annulus. Coronary artery calcifications. Hepatobiliary: No suspicious hepatic lesion. Gallbladder is unremarkable. No biliary ductal dilation. Pancreas: No pancreatic ductal dilation or evidence of acute inflammation. Spleen: No splenomegaly. Adrenals/Urinary Tract: Stable benign small right adrenal adenoma which requires no follow-up. Stable benign renal cysts and hypodense renal lesions technically too small to accurately characterize but statistically likely reflect cysts which require no independent  imaging follow-up. No hydronephrosis. Subtle bladder wall thickening and urothelial hyperenhancement perivesicular stranding. Stomach/Bowel: Radiopaque enteric contrast material traverses distal loops of small bowel. Stomach is unremarkable for degree of distension. No pathologic dilation of small or large bowel. No evidence of acute bowel inflammation. Vascular/Lymphatic: Aortic atherosclerosis. Bulky lymph node at the left iliac bifurcation measures 3.8 x 3.2 cm on image 66/2, unchanged. No new pathologically enlarged abdominal or pelvic lymph node. Reproductive: Brachytherapy seeds in the prostate. Other: No significant abdominopelvic free fluid. Musculoskeletal: No discrete CT correlate to the radiotracer avid osseous lesions in the right acetabulum or spine seen on prior PET-CT. No aggressive lytic or blastic lesion of bone. Multilevel degenerative changes spine. IMPRESSION: 1. Stable bulky lymph node at the left iliac bifurcation. 2. No evidence of new adenopathy or  soft tissue metastatic disease in the abdomen or pelvis. 3. No CT correlate for the previously identified PET-CT radiotracer avid osseous lesions. 4. Subtle bladder wall thickening and urothelial hyperenhancement with perivesicular stranding, suggestive of cystitis. Correlate with urinalysis. 5.  Aortic Atherosclerosis (ICD10-I70.0). Electronically Signed   By: Dahlia Bailiff M.D.   On: 06/02/2022 15:19   NM Bone Scan Whole Body  Result Date: 05/21/2022 CLINICAL DATA:  Prostate cancer EXAM: NUCLEAR MEDICINE WHOLE BODY BONE SCAN TECHNIQUE: Whole body anterior and posterior images were obtained approximately 3 hours after intravenous injection of radiopharmaceutical. RADIOPHARMACEUTICALS:  20.5 mCi Technetium-64mMDP IV COMPARISON:  02/04/2022 Radiographic correlation: None recent FINDINGS: Uptake at shoulders, sternoclavicular joints, typically degenerative. No abnormal tracer accumulation seen to suggest osseous metastatic disease. Expected urinary tract and soft tissue distribution of tracer. Urinary contamination at perineum and medial LEFT foot. IMPRESSION: No scintigraphic evidence of osseous metastatic disease. Electronically Signed   By: MLavonia DanaM.D.   On: 05/21/2022 16:45     ASSESSMENT:  Metastatic castration refractory prostate cancer to left iliac lymph node: - History of prostate adenocarcinoma, T2b, Gleason 7, PSA 16.6 - Treated with neoadjuvant hormonal therapy, EBRT followed by brachytherapy with iodine 125 seeds in 2010 - Rising PSA levels in February 2019 - Bone scan on 08/10/2017 negative.  CTAP on 08/26/2017 shows enlarged left pelvic sidewall lymph node 2.2 cm. - Lupron initiated on 08/10/2019. - CTAP on 03/09/2020 with left internal/external iliac lymph node measuring 2.1 x 2.8 cm (2.1 x 2.2 cm), craniocaudal 4.1 cm, previously 3.4 cm. - Enzalutamide to 160 mg daily started on 04/17/2020.  Discontinued end of October 2022. - PSA on 02/20/2021 (5.2), 07/11/2020 (2.0), 02/08/2020  (3.1) - CTAP on 03/07/2021 with left iliac bifurcation lymph node measuring 3.5 x 2.6 cm (previously 2.8 x 2.0 cm) - Bone scan on 03/07/2021 negative. - Left pelvic lymph node biopsy on 04/08/2021, metastatic prostate cancer. - We have reviewed PSMA PET scan results which showed enlarged left iliac lymph node 33 mm with SUV 285.  1 mm lymph node and 4 mm node between IVC and aorta.  Activity in the right acetabulum, left iliac bone, left transverse process of T11 vertebral body, spine of the right scapula. - Guardant 360 did not show any targetable mutations.  Did not show MSI high. - NGS testing could not be done because of less than 100 tumor cells. - Abiraterone and prednisone from 07/15/2021 through 10/01/2021 with progression. - Germline mutation [facility] testing was negative. - Cycle 1 of docetaxel on 10/21/2021.   PLAN:  Metastatic castration refractory prostate cancer to the left iliac lymph node: -He has completed 6 cycle of docetaxel on 04/28/2022. - Last  PSA is 9.5, up from 6.39. - CTAP (06/02/2022): Stable bulky lymph node at the left iliac bifurcation.  No evidence of new adenopathy. - Bone scan (05/20/2022): No evidence of bone metastasis. - He has stable disease on scans even though his PSA has slightly increased.  I have recommended continuing with the same treatment with dose attenuated docetaxel.  He is tolerating very well. - He will come back for his next cycle 7 next week.  RTC 4 weeks prior to cycle 8.  2.  Weight loss: - Continue Megace twice daily. - He has gained 2 pounds.  3.  Normocytic anemia: - He has completed 3 doses of Venofer.  Last hemoglobin is 9.7.  Will check ferritin and iron panel at next visit.  4.  Bone metastasis: - Will plan to start denosumab after dental evaluation.  5.  Hypomagnesemia/hypokalemia: - Continue potassium 20 mEq daily and magnesium twice daily.   Orders placed this encounter:  Orders Placed This Encounter  Procedures   Iron and  TIBC ([facility] ONLY)   Ferritin      Derek Jack, MD [place] [phone]

## 2022-06-11 ENCOUNTER — Inpatient Hospital Stay: Payer: Medicare HMO

## 2022-06-11 VITALS — BP 133/67 | HR 67 | Temp 97.8°F

## 2022-06-11 DIAGNOSIS — D649 Anemia, unspecified: Secondary | ICD-10-CM | POA: Diagnosis not present

## 2022-06-11 DIAGNOSIS — C61 Malignant neoplasm of prostate: Secondary | ICD-10-CM | POA: Diagnosis not present

## 2022-06-11 DIAGNOSIS — D509 Iron deficiency anemia, unspecified: Secondary | ICD-10-CM

## 2022-06-11 DIAGNOSIS — Z95828 Presence of other vascular implants and grafts: Secondary | ICD-10-CM

## 2022-06-11 DIAGNOSIS — Z79899 Other long term (current) drug therapy: Secondary | ICD-10-CM | POA: Diagnosis not present

## 2022-06-11 DIAGNOSIS — C775 Secondary and unspecified malignant neoplasm of intrapelvic lymph nodes: Secondary | ICD-10-CM | POA: Diagnosis not present

## 2022-06-11 DIAGNOSIS — Z9079 Acquired absence of other genital organ(s): Secondary | ICD-10-CM | POA: Diagnosis not present

## 2022-06-11 DIAGNOSIS — Z923 Personal history of irradiation: Secondary | ICD-10-CM | POA: Diagnosis not present

## 2022-06-11 DIAGNOSIS — C7951 Secondary malignant neoplasm of bone: Secondary | ICD-10-CM | POA: Diagnosis not present

## 2022-06-11 DIAGNOSIS — Z7982 Long term (current) use of aspirin: Secondary | ICD-10-CM | POA: Diagnosis not present

## 2022-06-11 DIAGNOSIS — Z5111 Encounter for antineoplastic chemotherapy: Secondary | ICD-10-CM | POA: Diagnosis not present

## 2022-06-11 DIAGNOSIS — R69 Illness, unspecified: Secondary | ICD-10-CM | POA: Diagnosis not present

## 2022-06-11 DIAGNOSIS — I10 Essential (primary) hypertension: Secondary | ICD-10-CM | POA: Diagnosis not present

## 2022-06-11 DIAGNOSIS — E876 Hypokalemia: Secondary | ICD-10-CM | POA: Diagnosis not present

## 2022-06-11 LAB — COMPREHENSIVE METABOLIC PANEL
ALT: 12 U/L (ref 0–44)
AST: 18 U/L (ref 15–41)
Albumin: 2.8 g/dL — ABNORMAL LOW (ref 3.5–5.0)
Alkaline Phosphatase: 78 U/L (ref 38–126)
Anion gap: 9 (ref 5–15)
BUN: 22 mg/dL (ref 8–23)
CO2: 21 mmol/L — ABNORMAL LOW (ref 22–32)
Calcium: 8.1 mg/dL — ABNORMAL LOW (ref 8.9–10.3)
Chloride: 106 mmol/L (ref 98–111)
Creatinine, Ser: 1.58 mg/dL — ABNORMAL HIGH (ref 0.61–1.24)
GFR, Estimated: 45 mL/min — ABNORMAL LOW (ref >60)
Glucose, Bld: 124 mg/dL — ABNORMAL HIGH (ref 70–99)
Potassium: 3.2 mmol/L — ABNORMAL LOW (ref 3.5–5.1)
Sodium: 136 mmol/L (ref 135–145)
Total Bilirubin: 0.5 mg/dL (ref 0.3–1.2)
Total Protein: 6.3 g/dL — ABNORMAL LOW (ref 6.5–8.1)

## 2022-06-11 LAB — CBC WITH DIFFERENTIAL/PLATELET
Abs Immature Granulocytes: 0.03 10*3/uL (ref 0.00–0.07)
Basophils Absolute: 0.1 10*3/uL (ref 0.0–0.1)
Basophils Relative: 1 %
Eosinophils Absolute: 0.3 10*3/uL (ref 0.0–0.5)
Eosinophils Relative: 3 %
HCT: 28.6 % — ABNORMAL LOW (ref 39.0–52.0)
Hemoglobin: 9.4 g/dL — ABNORMAL LOW (ref 13.0–17.0)
Immature Granulocytes: 0 %
Lymphocytes Relative: 33 %
Lymphs Abs: 2.8 10*3/uL (ref 0.7–4.0)
MCH: 32.3 pg (ref 26.0–34.0)
MCHC: 32.9 g/dL (ref 30.0–36.0)
MCV: 98.3 fL (ref 80.0–100.0)
Monocytes Absolute: 0.7 10*3/uL (ref 0.1–1.0)
Monocytes Relative: 8 %
Neutro Abs: 4.8 10*3/uL (ref 1.7–7.7)
Neutrophils Relative %: 55 %
Platelets: 245 10*3/uL (ref 150–400)
RBC: 2.91 MIL/uL — ABNORMAL LOW (ref 4.22–5.81)
RDW: 14.5 % (ref 11.5–15.5)
WBC: 8.7 10*3/uL (ref 4.0–10.5)
nRBC: 0 % (ref 0.0–0.2)

## 2022-06-11 LAB — IRON AND TIBC
Iron: 73 ug/dL (ref 45–182)
Saturation Ratios: 30 % (ref 17.9–39.5)
TIBC: 245 ug/dL — ABNORMAL LOW (ref 250–450)
UIBC: 172 ug/dL

## 2022-06-11 LAB — MAGNESIUM: Magnesium: 1.6 mg/dL — ABNORMAL LOW (ref 1.7–2.4)

## 2022-06-11 LAB — FERRITIN: Ferritin: 452 ng/mL — ABNORMAL HIGH (ref 24–336)

## 2022-06-11 MED ORDER — SODIUM CHLORIDE 0.9% FLUSH
10.0000 mL | INTRAVENOUS | Status: DC | PRN
  Administered 2022-06-11: 10 mL

## 2022-06-11 MED ORDER — SODIUM CHLORIDE 0.9 % IV SOLN
10.0000 mg | Freq: Once | INTRAVENOUS | Status: AC
  Administered 2022-06-11: 10 mg via INTRAVENOUS
  Filled 2022-06-11: qty 10

## 2022-06-11 MED ORDER — MAGNESIUM SULFATE 2 GM/50ML IV SOLN
2.0000 g | Freq: Once | INTRAVENOUS | Status: AC
  Administered 2022-06-11: 2 g via INTRAVENOUS
  Filled 2022-06-11: qty 50

## 2022-06-11 MED ORDER — SODIUM CHLORIDE 0.9 % IV SOLN: Freq: Once | INTRAVENOUS | Status: AC

## 2022-06-11 MED ORDER — HEPARIN SOD (PORK) LOCK FLUSH 100 UNIT/ML IV SOLN
500.0000 [IU] | Freq: Once | INTRAVENOUS | Status: AC | PRN
  Administered 2022-06-11: 500 [IU]

## 2022-06-11 MED ORDER — PALONOSETRON HCL INJECTION 0.25 MG/5ML
0.2500 mg | Freq: Once | INTRAVENOUS | Status: AC
  Administered 2022-06-11: 0.25 mg via INTRAVENOUS
  Filled 2022-06-11: qty 5

## 2022-06-11 MED ORDER — SODIUM CHLORIDE 0.9 % IV SOLN
50.0000 mg/m2 | Freq: Once | INTRAVENOUS | Status: AC
  Administered 2022-06-11: 100 mg via INTRAVENOUS
  Filled 2022-06-11: qty 10

## 2022-06-11 MED ORDER — SODIUM CHLORIDE 0.9% FLUSH
10.0000 mL | Freq: Once | INTRAVENOUS | Status: AC
  Administered 2022-06-11: 10 mL via INTRAVENOUS

## 2022-06-11 MED ORDER — POTASSIUM CHLORIDE IN NACL 20-0.9 MEQ/L-% IV SOLN
Freq: Once | INTRAVENOUS | Status: AC
  Filled 2022-06-11: qty 1000

## 2022-06-11 NOTE — Progress Notes (Signed)
Patient presents today for Taxotere infusion.  Patient is in satisfactory condition with no complaints voiced.  Vital signs are stable.  Labs reviewed.  Bilirubin today is 1.58, potassium is 3.2, and magnesium is 1.6.  MD made aware.  We will give one liter of house IVF per Dr. Delton Coombes and proceed with treatment.    Patient tolerated treatment well with no complaints voiced.  Patient left ambulatory in stable condition.  Vital signs stable at discharge.  Follow up as scheduled.

## 2022-06-11 NOTE — Patient Instructions (Signed)
[facility]  Discharge Instructions: Thank you for choosing [facility] to provide your oncology and hematology care.  If you have a lab appointment with the [facility], please come in thru the Main Entrance and check in at the main information desk.  Wear comfortable clothing and clothing appropriate for easy access to any Portacath or PICC line.   We strive to give you quality time with your provider. You may need to reschedule your appointment if you arrive late (15 or more minutes).  Arriving late affects you and other patients whose appointments are after yours.  Also, if you miss three or more appointments without notifying the office, you may be dismissed from the clinic at the provider's discretion.      For prescription refill requests, have your pharmacy contact our office and allow 72 hours for refills to be completed.    Today you received the following chemotherapy and/or immunotherapy agents Taxotere.  Docetaxel Injection What is this medication? DOCETAXEL (doe se TAX el) treats some types of cancer. It works by slowing down the growth of cancer cells. This medicine may be used for other purposes; ask your health care provider or pharmacist if you have questions. COMMON BRAND NAME(S): Docefrez, Taxotere What should I tell my care team before I take this medication? They need to know if you have any of these conditions: Kidney disease Liver disease Low white blood cell levels Tingling of the fingers or toes or other nerve disorder An unusual or allergic reaction to docetaxel, polysorbate 80, other medications, foods, dyes, or preservatives Pregnant or trying to get pregnant Breast-feeding How should I use this medication? This medication is injected into a vein. It is given by your care team in a hospital or clinic setting. Talk to your care team about the use of this medication in children. Special care may be needed. Overdosage: If  you think you have taken too much of this medicine contact a poison control center or emergency room at once. NOTE: This medicine is only for you. Do not share this medicine with others. What if I miss a dose? Keep appointments for follow-up doses. It is important not to miss your dose. Call your care team if you are unable to keep an appointment. What may interact with this medication? Do not take this medication with any of the following: Live virus vaccines This medication may also interact with the following: Certain antibiotics, such as clarithromycin, telithromycin Certain antivirals for HIV or hepatitis Certain medications for fungal infections, such as itraconazole, ketoconazole, voriconazole Grapefruit juice Nefazodone Supplements, such as St. John's wort This list may not describe all possible interactions. Give your health care provider a list of all the medicines, herbs, non-prescription drugs, or dietary supplements you use. Also tell them if you smoke, drink alcohol, or use illegal drugs. Some items may interact with your medicine. What should I watch for while using this medication? This medication may make you feel generally unwell. This is not uncommon as chemotherapy can affect healthy cells as well as cancer cells. Report any side effects. Continue your course of treatment even though you feel ill unless your care team tells you to stop. You may need blood work done while you are taking this medication. This medication can cause serious side effects and infusion reactions. To reduce the risk, your care team may give you other medications to take before receiving this one. Be sure to follow the directions from your care  team. This medication may increase your risk of getting an infection. Call your care team for advice if you get a fever, chills, sore throat, or other symptoms of a cold or flu. Do not treat yourself. Try to avoid being around people who are sick. Avoid taking  medications that contain aspirin, acetaminophen, ibuprofen, naproxen, or ketoprofen unless instructed by your care team. These medications may hide a fever. Be careful brushing or flossing your teeth or using a toothpick because you may get an infection or bleed more easily. If you have any dental work done, tell your dentist you are receiving this medication. Some products may contain alcohol. Ask your care team if this medication contains alcohol. Be sure to tell all care teams you are taking this medicine. Certain medications, like metronidazole and disulfiram, can cause an unpleasant reaction when taken with alcohol. The reaction includes flushing, headache, nausea, vomiting, sweating, and increased thirst. The reaction can last from 30 minutes to several hours. This medication may affect your coordination, reaction time, or judgement. Do not drive or operate machinery until you know how this medication affects you. Sit up or stand slowly to reduce the risk of dizzy or fainting spells. Drinking alcohol with this medication can increase the risk of these side effects. Talk to your care team about your risk of cancer. You may be more at risk for certain types of cancer if you take this medication. Talk to your care team if you wish to become pregnant or think you might be pregnant. This medication can cause serious birth defects if taken during pregnancy or if you get pregnant within 2 months after stopping therapy. A negative pregnancy test is required before starting this medication. A reliable form of contraception is recommended while taking this medication and for 2 months after stopping it. Talk to your care team about reliable forms of contraception. Do not breast-feed while taking this medication and for 1 week after stopping therapy. Use a condom during sex and for 4 months after stopping therapy. Tell your care team right away if you think your partner might be pregnant. This medication can cause  serious birth defects. This medication may cause infertility. Talk to your care team if you are concerned about your fertility. What side effects may I notice from receiving this medication? Side effects that you should report to your care team as soon as possible: Allergic reactions--skin rash, itching, hives, swelling of the face, lips, tongue, or throat Change in vision such as blurry vision, seeing halos around lights, vision loss Infection--fever, chills, cough, or sore throat Infusion reactions--chest pain, shortness of breath or trouble breathing, feeling faint or lightheaded Low red blood cell level--unusual weakness or fatigue, dizziness, headache, trouble breathing Pain, tingling, or numbness in the hands or feet Painful swelling, warmth, or redness of the skin, blisters or sores at the infusion site Redness, blistering, peeling, or loosening of the skin, including inside the mouth Sudden or severe stomach pain, bloody diarrhea, fever, nausea, vomiting Swelling of the ankles, hands, or feet Tumor lysis syndrome (TLS)--nausea, vomiting, diarrhea, decrease in the amount of urine, dark urine, unusual weakness or fatigue, confusion, muscle pain or cramps, fast or irregular heartbeat, joint pain Unusual bruising or bleeding Side effects that usually do not require medical attention (report to your care team if they continue or are bothersome): Change in nail shape, thickness, or color Change in taste Hair loss Increased tears This list may not describe all possible side effects. Call your doctor  for medical advice about side effects. You may report side effects to FDA at 1-800-FDA-1088. Where should I keep my medication? This medication is given in a hospital or clinic. It will not be stored at home. NOTE: This sheet is a summary. It may not cover all possible information. If you have questions about this medicine, talk to your doctor, pharmacist, or health care provider.  2023  Elsevier/Gold Standard (2007-08-07 00:00:00)        To help prevent nausea and vomiting after your treatment, we encourage you to take your nausea medication as directed.  BELOW ARE SYMPTOMS THAT SHOULD BE REPORTED IMMEDIATELY: *FEVER GREATER THAN 100.4 F (38 C) OR HIGHER *CHILLS OR SWEATING *NAUSEA AND VOMITING THAT IS NOT CONTROLLED WITH YOUR NAUSEA MEDICATION *UNUSUAL SHORTNESS OF BREATH *UNUSUAL BRUISING OR BLEEDING *URINARY PROBLEMS (pain or burning when urinating, or frequent urination) *BOWEL PROBLEMS (unusual diarrhea, constipation, pain near the anus) TENDERNESS IN MOUTH AND THROAT WITH OR WITHOUT PRESENCE OF ULCERS (sore throat, sores in mouth, or a toothache) UNUSUAL RASH, SWELLING OR PAIN  UNUSUAL VAGINAL DISCHARGE OR ITCHING   Items with * indicate a potential emergency and should be followed up as soon as possible or go to the Emergency Department if any problems should occur.  Please show the CHEMOTHERAPY ALERT CARD or IMMUNOTHERAPY ALERT CARD at check-in to the Emergency Department and triage nurse.  Should you have questions after your visit or need to cancel or reschedule your appointment, please contact [facility] [phone]  and follow the prompts.  Office hours are 8:00 a.m. to 4:30 p.m. Monday - Friday. Please note that voicemails left after 4:00 p.m. may not be returned until the following business day.  We are closed weekends and major holidays. You have access to a nurse at all times for urgent questions. Please call the main number to the clinic [phone] and follow the prompts.  For any non-urgent questions, you may also contact your provider using MyChart. We now offer e-Visits for anyone 51 and older to request care online for non-urgent symptoms. For details visit [email or web address].   Also download the MyChart app! Go to the app store, search "MyChart", open the app, select [facility], and log in with your MyChart username and  password.  Masks are optional in the cancer centers. If you would like for your care team to wear a mask while they are taking care of you, please let them know. You may have one support person who is at least 76 years old accompany you for your appointments.

## 2022-06-24 ENCOUNTER — Inpatient Hospital Stay: Payer: Medicare HMO

## 2022-06-24 ENCOUNTER — Other Ambulatory Visit: Payer: Self-pay | Admitting: *Deleted

## 2022-06-24 ENCOUNTER — Other Ambulatory Visit: Payer: Self-pay

## 2022-06-24 DIAGNOSIS — R300 Dysuria: Secondary | ICD-10-CM

## 2022-06-24 LAB — URINALYSIS, ROUTINE W REFLEX MICROSCOPIC
Bilirubin Urine: NEGATIVE
Glucose, UA: 50 mg/dL — AB
Ketones, ur: 5 mg/dL — AB
Nitrite: NEGATIVE
Protein, ur: >=300 mg/dL — AB
RBC / HPF: >50 RBC/hpf — ABNORMAL HIGH (ref 0–5)
Specific Gravity, Urine: 1.025 (ref 1.005–1.030)
WBC, UA: >50 WBC/hpf — ABNORMAL HIGH (ref 0–5)
pH: 5 (ref 5.0–8.0)

## 2022-06-24 MED ORDER — CIPROFLOXACIN HCL 500 MG PO TABS: 500.0000 mg | ORAL_TABLET | Freq: Two times a day (BID) | ORAL | 0 refills | Status: DC

## 2022-06-24 NOTE — Telephone Encounter (Signed)
Order placed for Cipro 500 BID for 5 days per Dr. Delton Coombes due to UA results.

## 2022-06-24 NOTE — Progress Notes (Signed)
Patient called with urinary symptoms of burning with no other associated issues.  Has been taking AZO without relief.  Per Dr. Delton Coombes, patient to come in for a urinalysis this morning and will treat accordingly.

## 2022-06-27 ENCOUNTER — Ambulatory Visit: Payer: Self-pay | Admitting: *Deleted

## 2022-06-27 NOTE — Patient Outreach (Signed)
  Care Coordination   06/27/2022 Name: Andrew [name] MRN: [id number] DOB: 1945/10/30   Care Coordination Outreach Attempts:  An unsuccessful telephone outreach was attempted today to offer the patient information about available care coordination services as a benefit of their health plan.   Follow Up Plan:  Additional outreach attempts will be made to offer the patient care coordination information and services.   Encounter Outcome:  No Answer   Care Coordination Interventions:  No, not indicated     Rexton Greulich L. Lavina Hamman, RN, BSN, [facility] Coordinator Office number [phone]

## 2022-06-28 ENCOUNTER — Other Ambulatory Visit: Payer: Self-pay

## 2022-07-01 ENCOUNTER — Encounter: Payer: Self-pay | Admitting: Hematology

## 2022-07-02 ENCOUNTER — Inpatient Hospital Stay: Payer: Medicare Other | Attending: Hematology | Admitting: Hematology

## 2022-07-02 ENCOUNTER — Encounter: Payer: Self-pay | Admitting: Hematology

## 2022-07-02 ENCOUNTER — Inpatient Hospital Stay: Payer: Medicare Other

## 2022-07-02 VITALS — BP 113/70 | HR 84 | Temp 98.3°F | Resp 18 | Wt 169.8 lb

## 2022-07-02 VITALS — BP 131/79 | HR 77 | Temp 97.3°F | Resp 18

## 2022-07-02 DIAGNOSIS — Z95828 Presence of other vascular implants and grafts: Secondary | ICD-10-CM

## 2022-07-02 DIAGNOSIS — Z79899 Other long term (current) drug therapy: Secondary | ICD-10-CM | POA: Insufficient documentation

## 2022-07-02 DIAGNOSIS — E119 Type 2 diabetes mellitus without complications: Secondary | ICD-10-CM | POA: Diagnosis not present

## 2022-07-02 DIAGNOSIS — Z5111 Encounter for antineoplastic chemotherapy: Secondary | ICD-10-CM | POA: Diagnosis not present

## 2022-07-02 DIAGNOSIS — F17210 Nicotine dependence, cigarettes, uncomplicated: Secondary | ICD-10-CM | POA: Diagnosis not present

## 2022-07-02 DIAGNOSIS — C61 Malignant neoplasm of prostate: Secondary | ICD-10-CM

## 2022-07-02 DIAGNOSIS — E876 Hypokalemia: Secondary | ICD-10-CM | POA: Diagnosis not present

## 2022-07-02 DIAGNOSIS — R634 Abnormal weight loss: Secondary | ICD-10-CM | POA: Diagnosis not present

## 2022-07-02 DIAGNOSIS — C7951 Secondary malignant neoplasm of bone: Secondary | ICD-10-CM | POA: Diagnosis not present

## 2022-07-02 DIAGNOSIS — Z7982 Long term (current) use of aspirin: Secondary | ICD-10-CM | POA: Diagnosis not present

## 2022-07-02 DIAGNOSIS — Z7984 Long term (current) use of oral hypoglycemic drugs: Secondary | ICD-10-CM | POA: Diagnosis not present

## 2022-07-02 DIAGNOSIS — I10 Essential (primary) hypertension: Secondary | ICD-10-CM | POA: Insufficient documentation

## 2022-07-02 DIAGNOSIS — R531 Weakness: Secondary | ICD-10-CM | POA: Insufficient documentation

## 2022-07-02 DIAGNOSIS — Z87440 Personal history of urinary (tract) infections: Secondary | ICD-10-CM | POA: Diagnosis not present

## 2022-07-02 DIAGNOSIS — D649 Anemia, unspecified: Secondary | ICD-10-CM | POA: Insufficient documentation

## 2022-07-02 DIAGNOSIS — C775 Secondary and unspecified malignant neoplasm of intrapelvic lymph nodes: Secondary | ICD-10-CM | POA: Insufficient documentation

## 2022-07-02 DIAGNOSIS — R300 Dysuria: Secondary | ICD-10-CM | POA: Insufficient documentation

## 2022-07-02 LAB — CBC WITH DIFFERENTIAL/PLATELET
Abs Immature Granulocytes: 0.04 10*3/uL (ref 0.00–0.07)
Basophils Absolute: 0.1 10*3/uL (ref 0.0–0.1)
Basophils Relative: 1 %
Eosinophils Absolute: 0 10*3/uL (ref 0.0–0.5)
Eosinophils Relative: 1 %
HCT: 28.5 % — ABNORMAL LOW (ref 39.0–52.0)
Hemoglobin: 9.3 g/dL — ABNORMAL LOW (ref 13.0–17.0)
Immature Granulocytes: 1 %
Lymphocytes Relative: 27 %
Lymphs Abs: 2.2 10*3/uL (ref 0.7–4.0)
MCH: 32 pg (ref 26.0–34.0)
MCHC: 32.6 g/dL (ref 30.0–36.0)
MCV: 97.9 fL (ref 80.0–100.0)
Monocytes Absolute: 0.7 10*3/uL (ref 0.1–1.0)
Monocytes Relative: 9 %
Neutro Abs: 5.2 10*3/uL (ref 1.7–7.7)
Neutrophils Relative %: 61 %
Platelets: 315 10*3/uL (ref 150–400)
RBC: 2.91 MIL/uL — ABNORMAL LOW (ref 4.22–5.81)
RDW: 13.9 % (ref 11.5–15.5)
WBC: 8.3 10*3/uL (ref 4.0–10.5)
nRBC: 0 % (ref 0.0–0.2)

## 2022-07-02 LAB — COMPREHENSIVE METABOLIC PANEL
ALT: 11 U/L (ref 0–44)
AST: 16 U/L (ref 15–41)
Albumin: 2.7 g/dL — ABNORMAL LOW (ref 3.5–5.0)
Alkaline Phosphatase: 61 U/L (ref 38–126)
Anion gap: 9 (ref 5–15)
BUN: 17 mg/dL (ref 8–23)
CO2: 17 mmol/L — ABNORMAL LOW (ref 22–32)
Calcium: 8.2 mg/dL — ABNORMAL LOW (ref 8.9–10.3)
Chloride: 106 mmol/L (ref 98–111)
Creatinine, Ser: 1.62 mg/dL — ABNORMAL HIGH (ref 0.61–1.24)
GFR, Estimated: 44 mL/min — ABNORMAL LOW (ref >60)
Glucose, Bld: 107 mg/dL — ABNORMAL HIGH (ref 70–99)
Potassium: 3.8 mmol/L (ref 3.5–5.1)
Sodium: 132 mmol/L — ABNORMAL LOW (ref 135–145)
Total Bilirubin: 0.5 mg/dL (ref 0.3–1.2)
Total Protein: 6.4 g/dL — ABNORMAL LOW (ref 6.5–8.1)

## 2022-07-02 LAB — MAGNESIUM: Magnesium: 1.8 mg/dL (ref 1.7–2.4)

## 2022-07-02 LAB — PSA: Prostatic Specific Antigen: 9.54 ng/mL — ABNORMAL HIGH (ref 0.00–4.00)

## 2022-07-02 MED ORDER — SODIUM CHLORIDE 0.9 % IV SOLN
50.0000 mg/m2 | Freq: Once | INTRAVENOUS | Status: AC
  Administered 2022-07-02: 100 mg via INTRAVENOUS
  Filled 2022-07-02: qty 10

## 2022-07-02 MED ORDER — SODIUM CHLORIDE 0.9 % IV SOLN: Freq: Once | INTRAVENOUS | Status: AC

## 2022-07-02 MED ORDER — SODIUM CHLORIDE 0.9% FLUSH
10.0000 mL | INTRAVENOUS | Status: DC | PRN
  Administered 2022-07-02: 10 mL via INTRAVENOUS

## 2022-07-02 MED ORDER — PALONOSETRON HCL INJECTION 0.25 MG/5ML
0.2500 mg | Freq: Once | INTRAVENOUS | Status: AC
  Administered 2022-07-02: 0.25 mg via INTRAVENOUS
  Filled 2022-07-02: qty 5

## 2022-07-02 MED ORDER — SODIUM CHLORIDE 0.9 % IV SOLN
10.0000 mg | Freq: Once | INTRAVENOUS | Status: AC
  Administered 2022-07-02: 10 mg via INTRAVENOUS
  Filled 2022-07-02: qty 10

## 2022-07-02 MED ORDER — HEPARIN SOD (PORK) LOCK FLUSH 100 UNIT/ML IV SOLN
500.0000 [IU] | Freq: Once | INTRAVENOUS | Status: AC
  Administered 2022-07-02: 500 [IU] via INTRAVENOUS

## 2022-07-02 NOTE — Progress Notes (Signed)
Patients port flushed without difficulty.  Good blood return noted with no bruising or swelling noted at site.  Patient remains accessed for chemotherapy treatment.  

## 2022-07-02 NOTE — Patient Instructions (Signed)
[facility]  Discharge Instructions: Thank you for choosing [facility] to provide your oncology and hematology care.  If you have a lab appointment with the [facility], please come in thru the Main Entrance and check in at the main information desk.  Wear comfortable clothing and clothing appropriate for easy access to any Portacath or PICC line.   We strive to give you quality time with your provider. You may need to reschedule your appointment if you arrive late (15 or more minutes).  Arriving late affects you and other patients whose appointments are after yours.  Also, if you miss three or more appointments without notifying the office, you may be dismissed from the clinic at the provider's discretion.      For prescription refill requests, have your pharmacy contact our office and allow 72 hours for refills to be completed.    Today you received the following chemotherapy and/or immunotherapy agents Taxotere. Docetaxel Injection What is this medication? DOCETAXEL (doe se TAX el) treats some types of cancer. It works by slowing down the growth of cancer cells. This medicine may be used for other purposes; ask your health care provider or pharmacist if you have questions. COMMON BRAND NAME(S): Docefrez, Taxotere What should I tell my care team before I take this medication? They need to know if you have any of these conditions: Kidney disease Liver disease Low white blood cell levels Tingling of the fingers or toes or other nerve disorder An unusual or allergic reaction to docetaxel, polysorbate 80, other medications, foods, dyes, or preservatives Pregnant or trying to get pregnant Breast-feeding How should I use this medication? This medication is injected into a vein. It is given by your care team in a hospital or clinic setting. Talk to your care team about the use of this medication in children. Special care may be needed. Overdosage: If you  think you have taken too much of this medicine contact a poison control center or emergency room at once. NOTE: This medicine is only for you. Do not share this medicine with others. What if I miss a dose? Keep appointments for follow-up doses. It is important not to miss your dose. Call your care team if you are unable to keep an appointment. What may interact with this medication? Do not take this medication with any of the following: Live virus vaccines This medication may also interact with the following: Certain antibiotics, such as clarithromycin, telithromycin Certain antivirals for HIV or hepatitis Certain medications for fungal infections, such as itraconazole, ketoconazole, voriconazole Grapefruit juice Nefazodone Supplements, such as St. John's wort This list may not describe all possible interactions. Give your health care provider a list of all the medicines, herbs, non-prescription drugs, or dietary supplements you use. Also tell them if you smoke, drink alcohol, or use illegal drugs. Some items may interact with your medicine. What should I watch for while using this medication? This medication may make you feel generally unwell. This is not uncommon as chemotherapy can affect healthy cells as well as cancer cells. Report any side effects. Continue your course of treatment even though you feel ill unless your care team tells you to stop. You may need blood work done while you are taking this medication. This medication can cause serious side effects and infusion reactions. To reduce the risk, your care team may give you other medications to take before receiving this one. Be sure to follow the directions from your care team.  This medication may increase your risk of getting an infection. Call your care team for advice if you get a fever, chills, sore throat, or other symptoms of a cold or flu. Do not treat yourself. Try to avoid being around people who are sick. Avoid taking  medications that contain aspirin, acetaminophen, ibuprofen, naproxen, or ketoprofen unless instructed by your care team. These medications may hide a fever. Be careful brushing or flossing your teeth or using a toothpick because you may get an infection or bleed more easily. If you have any dental work done, tell your dentist you are receiving this medication. Some products may contain alcohol. Ask your care team if this medication contains alcohol. Be sure to tell all care teams you are taking this medicine. Certain medications, like metronidazole and disulfiram, can cause an unpleasant reaction when taken with alcohol. The reaction includes flushing, headache, nausea, vomiting, sweating, and increased thirst. The reaction can last from 30 minutes to several hours. This medication may affect your coordination, reaction time, or judgement. Do not drive or operate machinery until you know how this medication affects you. Sit up or stand slowly to reduce the risk of dizzy or fainting spells. Drinking alcohol with this medication can increase the risk of these side effects. Talk to your care team about your risk of cancer. You may be more at risk for certain types of cancer if you take this medication. Talk to your care team if you wish to become pregnant or think you might be pregnant. This medication can cause serious birth defects if taken during pregnancy or if you get pregnant within 2 months after stopping therapy. A negative pregnancy test is required before starting this medication. A reliable form of contraception is recommended while taking this medication and for 2 months after stopping it. Talk to your care team about reliable forms of contraception. Do not breast-feed while taking this medication and for 1 week after stopping therapy. Use a condom during sex and for 4 months after stopping therapy. Tell your care team right away if you think your partner might be pregnant. This medication can cause  serious birth defects. This medication may cause infertility. Talk to your care team if you are concerned about your fertility. What side effects may I notice from receiving this medication? Side effects that you should report to your care team as soon as possible: Allergic reactions--skin rash, itching, hives, swelling of the face, lips, tongue, or throat Change in vision such as blurry vision, seeing halos around lights, vision loss Infection--fever, chills, cough, or sore throat Infusion reactions--chest pain, shortness of breath or trouble breathing, feeling faint or lightheaded Low red blood cell level--unusual weakness or fatigue, dizziness, headache, trouble breathing Pain, tingling, or numbness in the hands or feet Painful swelling, warmth, or redness of the skin, blisters or sores at the infusion site Redness, blistering, peeling, or loosening of the skin, including inside the mouth Sudden or severe stomach pain, bloody diarrhea, fever, nausea, vomiting Swelling of the ankles, hands, or feet Tumor lysis syndrome (TLS)--nausea, vomiting, diarrhea, decrease in the amount of urine, dark urine, unusual weakness or fatigue, confusion, muscle pain or cramps, fast or irregular heartbeat, joint pain Unusual bruising or bleeding Side effects that usually do not require medical attention (report to your care team if they continue or are bothersome): Change in nail shape, thickness, or color Change in taste Hair loss Increased tears This list may not describe all possible side effects. Call your doctor for  medical advice about side effects. You may report side effects to FDA at 1-800-FDA-1088. Where should I keep my medication? This medication is given in a hospital or clinic. It will not be stored at home. NOTE: This sheet is a summary. It may not cover all possible information. If you have questions about this medicine, talk to your doctor, pharmacist, or health care provider.  2023  Elsevier/Gold Standard (2007-08-07 00:00:00)       To help prevent nausea and vomiting after your treatment, we encourage you to take your nausea medication as directed.  BELOW ARE SYMPTOMS THAT SHOULD BE REPORTED IMMEDIATELY: *FEVER GREATER THAN 100.4 F (38 C) OR HIGHER *CHILLS OR SWEATING *NAUSEA AND VOMITING THAT IS NOT CONTROLLED WITH YOUR NAUSEA MEDICATION *UNUSUAL SHORTNESS OF BREATH *UNUSUAL BRUISING OR BLEEDING *URINARY PROBLEMS (pain or burning when urinating, or frequent urination) *BOWEL PROBLEMS (unusual diarrhea, constipation, pain near the anus) TENDERNESS IN MOUTH AND THROAT WITH OR WITHOUT PRESENCE OF ULCERS (sore throat, sores in mouth, or a toothache) UNUSUAL RASH, SWELLING OR PAIN  UNUSUAL VAGINAL DISCHARGE OR ITCHING   Items with * indicate a potential emergency and should be followed up as soon as possible or go to the Emergency Department if any problems should occur.  Please show the CHEMOTHERAPY ALERT CARD or IMMUNOTHERAPY ALERT CARD at check-in to the Emergency Department and triage nurse.  Should you have questions after your visit or need to cancel or reschedule your appointment, please contact [facility] [phone]  and follow the prompts.  Office hours are 8:00 a.m. to 4:30 p.m. Monday - Friday. Please note that voicemails left after 4:00 p.m. may not be returned until the following business day.  We are closed weekends and major holidays. You have access to a nurse at all times for urgent questions. Please call the main number to the clinic [phone] and follow the prompts.  For any non-urgent questions, you may also contact your provider using MyChart. We now offer e-Visits for anyone 71 and older to request care online for non-urgent symptoms. For details visit [email or web address].   Also download the MyChart app! Go to the app store, search "MyChart", open the app, select [facility], and log in with your MyChart username and  password.

## 2022-07-02 NOTE — Progress Notes (Signed)
The following biosimilar Udenyca ([id number]) has been selected for use in this patient.   Henreitta Leber, PharmD

## 2022-07-02 NOTE — Progress Notes (Signed)
Andrew [name], Andrew [name] [id number]   CLINIC:  Medical Oncology/Hematology  PCP:  [name], MD [address] 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 Report date is 05/07/2021.  The Multi-Cancer + RNA Panel offered by [facility] includes sequencing and/or deletion/duplication analysis of the following 84 genes:  AIP*, ALK, APC*, ATM*, AXIN2*, BAP1*, BARD1*, BLM*, BMPR1A*, BRCA1*, BRCA2*, BRIP1*, CASR, CDC73*, CDH1*, CDK4, CDKN1B*, CDKN1C*, CDKN2A, CEBPA, CHEK2*, CTNNA1*, DICER1*, DIS3L2*, EGFR, EPCAM, FH*, FLCN*, GATA2*, GPC3, GREM1, HOXB13, HRAS, KIT, MAX*, MEN1*, MET, MITF, MLH1*, MSH2*, MSH3*, MSH6*, MUTYH*, NBN*, NF1*, NF2*, NTHL1*, PALB2*, PDGFRA, PHOX2B, PMS2*, POLD1*, POLE*, POT1*, PRKAR1A*, PTCH1*, PTEN*, RAD50*, RAD51C*, RAD51D*, RB1*, RECQL4, RET, RUNX1*, SDHA*, SDHAF2*, SDHB*, SDHC*, SDHD*, SMAD4*, SMARCA4*, SMARCB1*, SMARCE1*, STK11*, SUFU*, TERC, TERT, TMEM127*, Tp53*, TSC1*, TSC2*, VHL*, WRN*, and WT1.  RNA analysis is performed for * genes.   10/21/2021 - 02/12/2022 Chemotherapy   Patient is on Treatment Plan : PROSTATE Docetaxel + Prednisone q21d     10/21/2021 -  Chemotherapy   Patient is on Treatment Plan : PROSTATE Docetaxel (75) + Prednisone q21d       CANCER STAGING:  Cancer Staging  Prostate cancer [facility]) Staging form: Prostate,  [name] 7th Edition - Clinical stage from 04/02/2021: rT2b, N1, PSA: 10 to 19, Gleason 7 - Unsigned   INTERVAL HISTORY:  Andrew [name], a 77 y.o. male, seen for follow-up of metastatic prostate cancer.  He is here for toxicity assessment prior to next cycle of chemotherapy.  He has lost about 5 pounds since last visit.  He is eating 3 small meals per day and drinking 1 can of Ensure per day.  Denies any tingling or numbness in the extremities.  He was treated for UTI on 06/24/2022 with Cipro.  He reports symptoms have improved.  Denies any new onset pains.  He still has some burning in the urine.  He has 2 more days of Cipro left.  REVIEW OF SYSTEMS:  Review of Systems  Genitourinary:  Positive for dysuria.   All other systems reviewed and are negative.   PAST MEDICAL/SURGICAL HISTORY:  Past Medical History:  Diagnosis Date   AKI (acute kidney injury) ([place]) 10/04/2021   Anemia    BPH (benign prostatic hyperplasia)    Sees Dr [name]   Cataract    Diabetes mellitus without complication [facility])    Educated about COVID-19 virus infection 07/18/2019   Genetic testing 05/09/2021   [facility] Multi-Cancer Panel was Negative. Report date is 05/07/2021.  The Multi-Cancer + RNA Panel offered by [facility] includes sequencing and/or deletion/duplication analysis of the following 84 genes:  AIP*, ALK, APC*, ATM*, AXIN2*, BAP1*, BARD1*, BLM*, BMPR1A*, BRCA1*, BRCA2*, BRIP1*, CASR, CDC73*, CDH1*, CDK4, CDKN1B*, CDKN1C*, CDKN2A, CEBPA, CHEK2*, CTNNA1*, DICER1*, DIS3L2*, EGFR, EPCAM, FH*, F   Hyperlipidemia    Hypertension    Low serum vitamin D    Port-A-Cath in place 10/09/2021  Prostate cancer [facility]) 2008   w/seed implantation and radiation   Past Surgical History:  Procedure Laterality Date   COLONOSCOPY  2015,2018   PORTACATH PLACEMENT Right 10/17/2021   Procedure: INSERTION PORT-A-CATH, [name];  Surgeon: [name], DO;  Location: [name];  Service: General;  Laterality: Right;  pt needs to have  glucose checked AM of surgery   PROSTATE SURGERY  2008   seed implant    SOCIAL HISTORY:  Social History   Socioeconomic History   Marital status: Married    Spouse name: Boron   Number of children: 2   Years of education: 12   Highest education level: 12th grade  Occupational History   Occupation: [name]    Comment: Retired Geologist, engineering  Tobacco Use   Smoking status: Every Day    Packs/day: 1.00    Years: 55.00    Total pack years: 55.00    Types: Cigarettes    Start date: 06/30/1965   Smokeless tobacco: Never   Tobacco comments:    Has tried nicotine replacement gum  Vaping Use   Vaping Use: Never used  Substance and Sexual Activity   Alcohol use: No   Drug use: No   Sexual activity: Yes  Other Topics Concern   Not on file  Social History Narrative   Retired, lives at home with wife [name], two grown children, enjoys gardening     Social Determinants of Health   Financial Resource Strain: Medium Risk (04/14/2022)   Overall Financial Resource Strain (CARDIA)    Difficulty of Paying Living Expenses: Somewhat hard  Food Insecurity: No Food Insecurity (03/07/2022)   Hunger Vital Sign    Worried About Running Out of Food in the Last Year: Never true    [place] in the Last Year: Never true  Transportation Needs: No Transportation Needs (03/07/2022)   PRAPARE - Hydrologist (Medical): No    Lack of Transportation (Non-Medical): No  Physical Activity: Inactive (01/27/2022)   Exercise Vital Sign    Days of Exercise per Week: 0 days    Minutes of Exercise per Session: 0 min  Stress: No Stress Concern Present (03/07/2022)   [place]    Feeling of Stress : Only a little  Social Connections: Moderately Integrated (01/27/2022)   Social Connection and Isolation Panel [NHANES]    Frequency of Communication with Friends and Family: More than three times a week     Frequency of Social Gatherings with Friends and Family: Once a week    Attends Religious Services: More than 4 times per year    Active Member of [employer] or Organizations: No    Attends Archivist Meetings: Never    Marital Status: Married  Human resources officer Violence: Not At Risk (01/27/2022)   Humiliation, Afraid, Rape, and Kick questionnaire    Fear of Current or Ex-Partner: No    Emotionally Abused: No    Physically Abused: No    Sexually Abused: No    FAMILY HISTORY:  Family History  Problem Relation Age of Onset   Diabetes Mother    Heart disease Mother    Kidney disease Mother        DIALYSIS   Emphysema Father    Deep vein thrombosis Sister    Kidney disease Sister    Lung cancer Sister    Colon polyps Brother    Hypertension Brother  Gout Brother    Colon cancer Neg Hx    Esophageal cancer Neg Hx    Rectal cancer Neg Hx    Prostate cancer Neg Hx     CURRENT MEDICATIONS:  Current Outpatient Medications  Medication Sig Dispense Refill   acetaminophen (TYLENOL) 650 MG CR tablet Take 1,300 mg by mouth every 8 (eight) hours as needed for pain.     alfuzosin (UROXATRAL) 10 MG 24 hr tablet Take 1 tablet (10 mg total) by mouth at bedtime. 90 tablet 3   ammonium lactate (AMLACTIN) 12 % cream      aspirin 81 MG tablet Take 1 tablet (81 mg total) by mouth daily with breakfast. 30 tablet 3   atorvastatin (LIPITOR) 40 MG tablet Take 1 tablet (40 mg total) by mouth daily. (Patient taking differently: Take 40 mg by mouth at bedtime.) 90 tablet 3   BOOSTRIX 5-2.5-18.5 LF-MCG/0.5 injection      cholecalciferol (VITAMIN D) 1000 UNITS tablet Take 1,000 Units by mouth daily.      ciprofloxacin (CIPRO) 500 MG tablet Take 1 tablet (500 mg total) by mouth 2 (two) times daily. 10 tablet 0   Dapagliflozin-sAXagliptin (QTERN) 10-5 MG TABS Take 1 tablet by mouth in the morning. 90 tablet 5   DOCEtaxel (TAXOTERE IV) Inject into the vein every 21 ( twenty-one) days.     docusate  sodium (COLACE) 100 MG capsule Take 1 capsule (100 mg total) by mouth 2 (two) times daily. (Patient taking differently: Take 100 mg by mouth daily as needed for mild constipation.) 60 capsule 2   escitalopram (LEXAPRO) 10 MG tablet Take 1 tablet (10 mg total) by mouth daily. 90 tablet 1   glimepiride (AMARYL) 2 MG tablet Take 2 mg by mouth 2 (two) times daily.     glucose blood test strip 1 each by Other route as needed for other. Use as instructed to test blood sugar daily.  One Touch Verio Test Strips DX: E11.65 100 each 11   lisinopril-hydrochlorothiazide (ZESTORETIC) 20-25 MG tablet Take 1 tablet by mouth daily. 90 tablet 3   loperamide (IMODIUM) 2 MG capsule Take 1 capsule (2 mg total) by mouth as needed for diarrhea or loose stools. 30 capsule 0   magnesium oxide (MAG-OX) 400 (240 Mg) MG tablet Take 1 tablet (400 mg total) by mouth 2 (two) times daily. 60 tablet 2   megestrol (MEGACE) 400 MG/10ML suspension Take 10 mLs (400 mg total) by mouth 2 (two) times daily. 480 mL 4   metFORMIN (GLUCOPHAGE) 1000 MG tablet Take 1 tablet (1,000 mg total) by mouth 2 (two) times daily with a meal. 180 tablet 2   oxyCODONE (ROXICODONE) 5 MG immediate release tablet Take 1 tablet (5 mg total) by mouth every 8 (eight) hours as needed. 10 tablet 0   potassium chloride SA (KLOR-CON M) 20 MEQ tablet Take 1 tablet (20 mEq total) by mouth daily. 30 tablet 6   pramoxine (PROCTOFOAM) 1 % foam Place 1 application. rectally 3 (three) times daily as needed for anal itching. 15 g 0   No current facility-administered medications for this visit.   Facility-Administered Medications Ordered in Other Visits  Medication Dose Route Frequency Provider Last Rate Last Admin   0.9 %  sodium chloride infusion   Intravenous Continuous Derek Jack, MD 20 mL/hr at 01/08/22 1513 Infusion Verify at 01/08/22 1513   sodium chloride flush (NS) 0.9 % injection 10 mL  10 mL Intravenous PRN Derek Jack, MD  ALLERGIES:  No Known Allergies  PHYSICAL EXAM:  Performance status (ECOG): 1 - Symptomatic but completely ambulatory  There were no vitals filed for this visit.   Wt Readings from Last 3 Encounters:  06/11/22 174 lb 2.6 oz (79 kg)  06/05/22 174 lb 1.6 oz (79 kg)  04/28/22 172 lb (78 kg)   Physical Exam Vitals reviewed.  Constitutional:      Appearance: Normal appearance.  Cardiovascular:     Rate and Rhythm: Normal rate and regular rhythm.     Pulses: Normal pulses.     Heart sounds: Normal heart sounds.  Pulmonary:     Effort: Pulmonary effort is normal.     Breath sounds: Normal breath sounds.  Musculoskeletal:     Right lower leg: No edema.     Left lower leg: No edema.  Neurological:     General: No focal deficit present.     Mental Status: He is alert and oriented to person, place, and time.  Psychiatric:        Mood and Affect: Mood normal.        Behavior: Behavior normal.    LABORATORY DATA:  I have reviewed the labs as listed.     Latest Ref Rng & Units 06/11/2022    7:47 AM 05/20/2022    9:39 AM 04/28/2022    9:39 AM  CBC  WBC 4.0 - 10.5 K/uL 8.7  7.6  7.2   Hemoglobin 13.0 - 17.0 g/dL 9.4  9.7  9.1   Hematocrit 39.0 - 52.0 % 28.6  30.1  27.2   Platelets 150 - 400 K/uL 245  389  225       Latest Ref Rng & Units 06/11/2022    7:47 AM 05/20/2022    9:39 AM 04/28/2022    9:39 AM  CMP  Glucose 70 - 99 mg/dL 124  119  107   BUN 8 - 23 mg/dL _0 Creatinine 0.61 - 1.24 mg/dL 1.58  1.32  1.20   Sodium 135 - 145 mmol/L 136  135  137   Potassium 3.5 - 5.1 mmol/L 3.2  4.3  3.1   Chloride 98 - 111 mmol/L 106  108  105   CO2 22 - 32 mmol/L _1 Calcium 8.9 - 10.3 mg/dL 8.1  8.4  8.2   Total Protein 6.5 - 8.1 g/dL 6.3  7.1  6.3   Total Bilirubin 0.3 - 1.2 mg/dL 0.5  0.4  0.7   Alkaline Phos 38 - 126 U/L 78  71  71   AST 15 - 41 U/L _2 ALT 0 - 44 U/L _3 DIAGNOSTIC IMAGING:  I have independently reviewed the  scans and discussed with the patient. CT Abdomen Pelvis W Contrast  Result Date: 06/02/2022 CLINICAL DATA:  History of prostate cancer, follow-up. * Tracking Code: BO * EXAM: CT ABDOMEN AND PELVIS WITH CONTRAST TECHNIQUE: Multidetector CT imaging of the abdomen and pelvis was performed using the standard protocol following bolus administration of intravenous contrast. RADIATION DOSE REDUCTION: This exam was performed according to the departmental dose-optimization program which includes automated exposure control, adjustment of the mA and/or kV according to patient size and/or use of iterative reconstruction technique. CONTRAST:  153m OMNIPAQUE IOHEXOL 300 MG/ML  SOLN COMPARISON:  Multiple priors including nuclear medicine bone scan May 20, 2022 and CT abdomen pelvis  January 02, 2022. FINDINGS: Lower chest: No acute abnormality. Calcifications of the mitral annulus. Coronary artery calcifications. Hepatobiliary: No suspicious hepatic lesion. Gallbladder is unremarkable. No biliary ductal dilation. Pancreas: No pancreatic ductal dilation or evidence of acute inflammation. Spleen: No splenomegaly. Adrenals/Urinary Tract: Stable benign small right adrenal adenoma which requires no follow-up. Stable benign renal cysts and hypodense renal lesions technically too small to accurately characterize but statistically likely reflect cysts which require no independent imaging follow-up. No hydronephrosis. Subtle bladder wall thickening and urothelial hyperenhancement perivesicular stranding. Stomach/Bowel: Radiopaque enteric contrast material traverses distal loops of small bowel. Stomach is unremarkable for degree of distension. No pathologic dilation of small or large bowel. No evidence of acute bowel inflammation. Vascular/Lymphatic: Aortic atherosclerosis. Bulky lymph node at the left iliac bifurcation measures 3.8 x 3.2 cm on image 66/2, unchanged. No new pathologically enlarged abdominal or pelvic lymph node.  Reproductive: Brachytherapy seeds in the prostate. Other: No significant abdominopelvic free fluid. Musculoskeletal: No discrete CT correlate to the radiotracer avid osseous lesions in the right acetabulum or spine seen on prior PET-CT. No aggressive lytic or blastic lesion of bone. Multilevel degenerative changes spine. IMPRESSION: 1. Stable bulky lymph node at the left iliac bifurcation. 2. No evidence of new adenopathy or soft tissue metastatic disease in the abdomen or pelvis. 3. No CT correlate for the previously identified PET-CT radiotracer avid osseous lesions. 4. Subtle bladder wall thickening and urothelial hyperenhancement with perivesicular stranding, suggestive of cystitis. Correlate with urinalysis. 5.  Aortic Atherosclerosis (ICD10-I70.0). Electronically Signed   By: Dahlia Bailiff M.D.   On: 06/02/2022 15:19     ASSESSMENT:  Metastatic castration refractory prostate cancer to left iliac lymph node: - History of prostate adenocarcinoma, T2b, Gleason 7, PSA 16.6 - Treated with neoadjuvant hormonal therapy, EBRT followed by brachytherapy with iodine 125 seeds in 2010 - Rising PSA levels in February 2019 - Bone scan on 08/10/2017 negative.  CTAP on 08/26/2017 shows enlarged left pelvic sidewall lymph node 2.2 cm. - Lupron initiated on 08/10/2019. - CTAP on 03/09/2020 with left internal/external iliac lymph node measuring 2.1 x 2.8 cm (2.1 x 2.2 cm), craniocaudal 4.1 cm, previously 3.4 cm. - Enzalutamide to 160 mg daily started on 04/17/2020.  Discontinued end of October 2022. - PSA on 02/20/2021 (5.2), 07/11/2020 (2.0), 02/08/2020 (3.1) - CTAP on 03/07/2021 with left iliac bifurcation lymph node measuring 3.5 x 2.6 cm (previously 2.8 x 2.0 cm) - Bone scan on 03/07/2021 negative. - Left pelvic lymph node biopsy on 04/08/2021, metastatic prostate cancer. - We have reviewed PSMA PET scan results which showed enlarged left iliac lymph node 33 mm with SUV 285.  1 mm lymph node and 4 mm node between IVC  and aorta.  Activity in the right acetabulum, left iliac bone, left transverse process of T11 vertebral body, spine of the right scapula. - Guardant 360 did not show any targetable mutations.  Did not show MSI high. - NGS testing could not be done because of less than 100 tumor cells. - Abiraterone and prednisone from 07/15/2021 through 10/01/2021 with progression. - Germline mutation [facility] testing was negative. - Cycle 1 of docetaxel on 10/21/2021.   PLAN:  Metastatic castration refractory prostate cancer to the left iliac lymph node: - CTAP (06/02/2022): Stable bulky lymph nodes at the left iliac bifurcation with no evidence of new adenopathy. - Bone scan (05/20/2022): No evidence of bone metastasis. - Last PSA elevated at 9.5 on 05/20/2022, up from 6.39 on 04/28/2022.  PSA from today is  pending. - Reviewed labs today which showed normal LFTs.  Creatinine is 1.62.  CBC was grossly normal.  Proceed with next cycle of treatment today.  He will receive final mL normal saline. - I will stretch out his chemo to every 4 weeks because of his ongoing weight loss. - RTC 4 weeks for follow-up.   2.  Weight loss: - Continue Megace twice daily.  He lost 5 pounds in the last 3 weeks.  Continue 3 small meals per day and 1 can of Ensure high-protein daily.  3.  Normocytic anemia: - He has completed 3 doses of Venofer.  Ferritin is 452 and percent saturation 30.  Hemoglobin is 9.3.  No indication for parenteral iron.  4.  Bone metastasis: - We will plan to start him on denosumab after dental evaluation.  5.  Hypomagnesemia/hypokalemia: - Continue potassium 20 mEq daily and magnesium twice daily.  Both are normal today.   Orders placed this encounter:  No orders of the defined types were placed in this encounter.     Derek Jack, MD [place] [phone]

## 2022-07-02 NOTE — Patient Instructions (Addendum)
[place] at [facility] Discharge Instructions   You were seen and examined today by Dr. Delton Coombes.  He reviewed some of the results of your lab work which are normal/stable. Your liver and kidney numbers, and PSA results are pending.   We will proceed with your treatment today.   We will treat you every 4 weeks going forward.      Thank you for choosing [place] at [facility] to provide your oncology and hematology care.  To afford each patient quality time with our provider, please arrive at least 15 minutes before your scheduled appointment time.   If you have a lab appointment with the [facility] please come in thru the Main Entrance and check in at the main information desk.  You need to re-schedule your appointment should you arrive 10 or more minutes late.  We strive to give you quality time with our providers, and arriving late affects you and other patients whose appointments are after yours.  Also, if you no show three or more times for appointments you may be dismissed from the clinic at the providers discretion.     Again, thank you for choosing [facility].  Our hope is that these requests will decrease the amount of time that you wait before being seen by our physicians.       _____________________________________________________________  Should you have questions after your visit to [facility], please contact our office at [phone] and follow the prompts.  Our office hours are 8:00 a.m. and 4:30 p.m. Monday - Friday.  Please note that voicemails left after 4:00 p.m. may not be returned until the following business day.  We are closed weekends and major holidays.  You do have access to a nurse 24-7, just call the main number to the clinic [phone] and do not press any options, hold on the line and a nurse will answer the phone.    For prescription refill requests, have your pharmacy  contact our office and allow 72 hours.    Due to Covid, you will need to wear a mask upon entering the hospital. If you do not have a mask, a mask will be given to you at the Main Entrance upon arrival. For doctor visits, patients may have 1 support person age 77 or older with them. For treatment visits, patients can not have anyone with them due to social distancing guidelines and our immunocompromised population.

## 2022-07-02 NOTE — Progress Notes (Signed)
Creatinine 1.67. Albumin 2.7. Message received from A. Ouida Sills RN / Dr. Delton Coombes to proceed with treatment. Orders received 500 ml bolus of NS over an hour with treatment.   Treatment given today per MD orders. Tolerated infusion without adverse affects. Vital signs stable. No complaints at this time. Discharged from clinic ambulatory in stable condition. Alert and oriented x 3. F/U with [facility] as scheduled.

## 2022-07-03 ENCOUNTER — Other Ambulatory Visit: Payer: Self-pay

## 2022-07-04 ENCOUNTER — Inpatient Hospital Stay: Payer: Medicare Other

## 2022-07-04 ENCOUNTER — Other Ambulatory Visit: Payer: Self-pay

## 2022-07-04 VITALS — BP 124/88 | HR 99 | Temp 97.8°F | Resp 18

## 2022-07-04 DIAGNOSIS — E876 Hypokalemia: Secondary | ICD-10-CM | POA: Diagnosis not present

## 2022-07-04 DIAGNOSIS — R300 Dysuria: Secondary | ICD-10-CM | POA: Diagnosis not present

## 2022-07-04 DIAGNOSIS — Z7982 Long term (current) use of aspirin: Secondary | ICD-10-CM | POA: Diagnosis not present

## 2022-07-04 DIAGNOSIS — Z79899 Other long term (current) drug therapy: Secondary | ICD-10-CM | POA: Diagnosis not present

## 2022-07-04 DIAGNOSIS — I10 Essential (primary) hypertension: Secondary | ICD-10-CM | POA: Diagnosis not present

## 2022-07-04 DIAGNOSIS — Z95828 Presence of other vascular implants and grafts: Secondary | ICD-10-CM

## 2022-07-04 DIAGNOSIS — C61 Malignant neoplasm of prostate: Secondary | ICD-10-CM

## 2022-07-04 DIAGNOSIS — F17210 Nicotine dependence, cigarettes, uncomplicated: Secondary | ICD-10-CM | POA: Diagnosis not present

## 2022-07-04 DIAGNOSIS — E119 Type 2 diabetes mellitus without complications: Secondary | ICD-10-CM | POA: Diagnosis not present

## 2022-07-04 DIAGNOSIS — Z5111 Encounter for antineoplastic chemotherapy: Secondary | ICD-10-CM | POA: Diagnosis not present

## 2022-07-04 DIAGNOSIS — R634 Abnormal weight loss: Secondary | ICD-10-CM | POA: Diagnosis not present

## 2022-07-04 DIAGNOSIS — Z7984 Long term (current) use of oral hypoglycemic drugs: Secondary | ICD-10-CM | POA: Diagnosis not present

## 2022-07-04 DIAGNOSIS — Z87440 Personal history of urinary (tract) infections: Secondary | ICD-10-CM | POA: Diagnosis not present

## 2022-07-04 DIAGNOSIS — C775 Secondary and unspecified malignant neoplasm of intrapelvic lymph nodes: Secondary | ICD-10-CM | POA: Diagnosis not present

## 2022-07-04 DIAGNOSIS — D649 Anemia, unspecified: Secondary | ICD-10-CM | POA: Diagnosis not present

## 2022-07-04 DIAGNOSIS — R531 Weakness: Secondary | ICD-10-CM | POA: Diagnosis not present

## 2022-07-04 DIAGNOSIS — C7951 Secondary malignant neoplasm of bone: Secondary | ICD-10-CM | POA: Diagnosis not present

## 2022-07-04 MED ORDER — PEGFILGRASTIM-CBQV 6 MG/0.6ML ~~LOC~~ SOSY
6.0000 mg | PREFILLED_SYRINGE | Freq: Once | SUBCUTANEOUS | Status: AC
  Administered 2022-07-04: 6 mg via SUBCUTANEOUS
  Filled 2022-07-04: qty 0.6

## 2022-07-04 NOTE — Patient Instructions (Signed)
[facility]  Discharge Instructions: Thank you for choosing [facility] to provide your oncology and hematology care.  If you have a lab appointment with the Cancer Center, please come in thru the Main Entrance and check in at the main information desk.  Wear comfortable clothing and clothing appropriate for easy access to any Portacath or PICC line.   We strive to give you quality time with your provider. You may need to reschedule your appointment if you arrive late (15 or more minutes).  Arriving late affects you and other patients whose appointments are after yours.  Also, if you miss three or more appointments without notifying the office, you may be dismissed from the clinic at the provider's discretion.      For prescription refill requests, have your pharmacy contact our office and allow 72 hours for refills to be completed.    Today you received the following chemotherapy and/or immunotherapy agents Udenyca      To help prevent nausea and vomiting after your treatment, we encourage you to take your nausea medication as directed.  BELOW ARE SYMPTOMS THAT SHOULD BE REPORTED IMMEDIATELY: *FEVER GREATER THAN 100.4 F (38 C) OR HIGHER *CHILLS OR SWEATING *NAUSEA AND VOMITING THAT IS NOT CONTROLLED WITH YOUR NAUSEA MEDICATION *UNUSUAL SHORTNESS OF BREATH *UNUSUAL BRUISING OR BLEEDING *URINARY PROBLEMS (pain or burning when urinating, or frequent urination) *BOWEL PROBLEMS (unusual diarrhea, constipation, pain near the anus) TENDERNESS IN MOUTH AND THROAT WITH OR WITHOUT PRESENCE OF ULCERS (sore throat, sores in mouth, or a toothache) UNUSUAL RASH, SWELLING OR PAIN  UNUSUAL VAGINAL DISCHARGE OR ITCHING   Items with * indicate a potential emergency and should be followed up as soon as possible or go to the Emergency Department if any problems should occur.  Please show the CHEMOTHERAPY ALERT CARD or IMMUNOTHERAPY ALERT CARD at check-in to the Emergency  Department and triage nurse.  Should you have questions after your visit or need to cancel or reschedule your appointment, please contact [facility] [phone]  and follow the prompts.  Office hours are 8:00 a.m. to 4:30 p.m. Monday - Friday. Please note that voicemails left after 4:00 p.m. may not be returned until the following business day.  We are closed weekends and major holidays. You have access to a nurse at all times for urgent questions. Please call the main number to the clinic [phone] and follow the prompts.  For any non-urgent questions, you may also contact your provider using MyChart. We now offer e-Visits for anyone 18 and older to request care online for non-urgent symptoms. For details visit [email or web address].   Also download the MyChart app! Go to the app store, search "MyChart", open the app, select [facility], and log in with your MyChart username and password.   

## 2022-07-04 NOTE — Progress Notes (Signed)
Patient presents today for Udenyca injection per providers order.  Stable during administration without incident; injection site WNL; see MAR for injection details.  Patient tolerated procedure well and without incident.  No questions or complaints noted at this time.

## 2022-07-07 DIAGNOSIS — K5900 Constipation, unspecified: Secondary | ICD-10-CM | POA: Diagnosis not present

## 2022-07-08 ENCOUNTER — Encounter: Payer: Self-pay | Admitting: Family Medicine

## 2022-07-08 ENCOUNTER — Ambulatory Visit: Payer: Medicare HMO | Admitting: Family Medicine

## 2022-07-10 ENCOUNTER — Telehealth: Payer: Self-pay | Admitting: *Deleted

## 2022-07-10 ENCOUNTER — Encounter: Payer: Self-pay | Admitting: Hematology

## 2022-07-10 NOTE — Telephone Encounter (Signed)
Daughter called to advise that patient is not eating, drinking or taking his medications, to include pain medication.  Pain is significant, due to his refusal to take medication.  Urine output has significantly decreased.  States that he is refusing to go to any appointments at this time.  She inquired as to if Hospice was an option.  I advised her that is a discussion that they need to have a discussion with him and other family members to see if this is the route he would like to entertain.  Otherwise they should call 911 and have him taken to the ER.  Daughter to call back and advise of decision.

## 2022-07-11 ENCOUNTER — Encounter: Payer: Self-pay | Admitting: Hematology

## 2022-07-26 ENCOUNTER — Telehealth: Payer: Self-pay

## 2022-07-26 NOTE — Patient Outreach (Signed)
  Care Coordination   07/26/2022 Late Entry Name: Andrew [name] MRN: [id number] DOB: 1946/02/09   Care Coordination Outreach Attempts:  A second unsuccessful outreach was attempted today to offer the patient with information about available care coordination services as a benefit of their health plan.     Follow Up Plan:  Additional outreach attempts will be made to offer the patient care coordination information and services.   Encounter Outcome:  No Answer   Care Coordination Interventions:  No, not indicated    Lazaro Arms RN, BSN, [place] Coordinator CM Registered Nurse Coverage  for  Andrew Quails RN   Phone: [phone]

## 2022-07-29 ENCOUNTER — Other Ambulatory Visit: Payer: Self-pay

## 2022-07-30 ENCOUNTER — Inpatient Hospital Stay (HOSPITAL_BASED_OUTPATIENT_CLINIC_OR_DEPARTMENT_OTHER): Payer: Medicare Other | Admitting: Hematology

## 2022-07-30 ENCOUNTER — Inpatient Hospital Stay: Payer: Medicare Other

## 2022-07-30 DIAGNOSIS — F17210 Nicotine dependence, cigarettes, uncomplicated: Secondary | ICD-10-CM | POA: Diagnosis not present

## 2022-07-30 DIAGNOSIS — C61 Malignant neoplasm of prostate: Secondary | ICD-10-CM | POA: Diagnosis not present

## 2022-07-30 DIAGNOSIS — R5381 Other malaise: Secondary | ICD-10-CM

## 2022-07-30 DIAGNOSIS — Z95828 Presence of other vascular implants and grafts: Secondary | ICD-10-CM | POA: Diagnosis not present

## 2022-07-30 DIAGNOSIS — E119 Type 2 diabetes mellitus without complications: Secondary | ICD-10-CM | POA: Diagnosis not present

## 2022-07-30 DIAGNOSIS — C775 Secondary and unspecified malignant neoplasm of intrapelvic lymph nodes: Secondary | ICD-10-CM | POA: Diagnosis not present

## 2022-07-30 DIAGNOSIS — R634 Abnormal weight loss: Secondary | ICD-10-CM | POA: Diagnosis not present

## 2022-07-30 DIAGNOSIS — Z87440 Personal history of urinary (tract) infections: Secondary | ICD-10-CM | POA: Diagnosis not present

## 2022-07-30 DIAGNOSIS — C7951 Secondary malignant neoplasm of bone: Secondary | ICD-10-CM | POA: Diagnosis not present

## 2022-07-30 DIAGNOSIS — I10 Essential (primary) hypertension: Secondary | ICD-10-CM | POA: Diagnosis not present

## 2022-07-30 DIAGNOSIS — Z7984 Long term (current) use of oral hypoglycemic drugs: Secondary | ICD-10-CM | POA: Diagnosis not present

## 2022-07-30 DIAGNOSIS — Z5111 Encounter for antineoplastic chemotherapy: Secondary | ICD-10-CM | POA: Diagnosis not present

## 2022-07-30 DIAGNOSIS — E876 Hypokalemia: Secondary | ICD-10-CM | POA: Diagnosis not present

## 2022-07-30 DIAGNOSIS — D649 Anemia, unspecified: Secondary | ICD-10-CM | POA: Diagnosis not present

## 2022-07-30 DIAGNOSIS — Z7982 Long term (current) use of aspirin: Secondary | ICD-10-CM | POA: Diagnosis not present

## 2022-07-30 DIAGNOSIS — R531 Weakness: Secondary | ICD-10-CM | POA: Diagnosis not present

## 2022-07-30 DIAGNOSIS — Z79899 Other long term (current) drug therapy: Secondary | ICD-10-CM | POA: Diagnosis not present

## 2022-07-30 DIAGNOSIS — R300 Dysuria: Secondary | ICD-10-CM | POA: Diagnosis not present

## 2022-07-30 LAB — CBC WITH DIFFERENTIAL/PLATELET
Abs Immature Granulocytes: 0.02 10*3/uL (ref 0.00–0.07)
Basophils Absolute: 0.1 10*3/uL (ref 0.0–0.1)
Basophils Relative: 1 %
Eosinophils Absolute: 0.1 10*3/uL (ref 0.0–0.5)
Eosinophils Relative: 2 %
HCT: 29.3 % — ABNORMAL LOW (ref 39.0–52.0)
Hemoglobin: 9.6 g/dL — ABNORMAL LOW (ref 13.0–17.0)
Immature Granulocytes: 0 %
Lymphocytes Relative: 25 %
Lymphs Abs: 1.7 10*3/uL (ref 0.7–4.0)
MCH: 31.8 pg (ref 26.0–34.0)
MCHC: 32.8 g/dL (ref 30.0–36.0)
MCV: 97 fL (ref 80.0–100.0)
Monocytes Absolute: 0.6 10*3/uL (ref 0.1–1.0)
Monocytes Relative: 9 %
Neutro Abs: 4.4 10*3/uL (ref 1.7–7.7)
Neutrophils Relative %: 63 %
Platelets: 243 10*3/uL (ref 150–400)
RBC: 3.02 MIL/uL — ABNORMAL LOW (ref 4.22–5.81)
RDW: 14.7 % (ref 11.5–15.5)
WBC: 6.9 10*3/uL (ref 4.0–10.5)
nRBC: 0 % (ref 0.0–0.2)

## 2022-07-30 LAB — COMPREHENSIVE METABOLIC PANEL
ALT: 13 U/L (ref 0–44)
AST: 19 U/L (ref 15–41)
Albumin: 2.7 g/dL — ABNORMAL LOW (ref 3.5–5.0)
Alkaline Phosphatase: 71 U/L (ref 38–126)
Anion gap: 8 (ref 5–15)
BUN: 14 mg/dL (ref 8–23)
CO2: 20 mmol/L — ABNORMAL LOW (ref 22–32)
Calcium: 8 mg/dL — ABNORMAL LOW (ref 8.9–10.3)
Chloride: 108 mmol/L (ref 98–111)
Creatinine, Ser: 1.23 mg/dL (ref 0.61–1.24)
GFR, Estimated: >60 mL/min (ref >60)
Glucose, Bld: 101 mg/dL — ABNORMAL HIGH (ref 70–99)
Potassium: 3.2 mmol/L — ABNORMAL LOW (ref 3.5–5.1)
Sodium: 136 mmol/L (ref 135–145)
Total Bilirubin: 0.5 mg/dL (ref 0.3–1.2)
Total Protein: 6.2 g/dL — ABNORMAL LOW (ref 6.5–8.1)

## 2022-07-30 LAB — MAGNESIUM: Magnesium: 1.7 mg/dL (ref 1.7–2.4)

## 2022-07-30 MED ORDER — SODIUM CHLORIDE 0.9% FLUSH
10.0000 mL | INTRAVENOUS | Status: DC | PRN
  Administered 2022-07-30: 10 mL via INTRAVENOUS

## 2022-07-30 MED ORDER — HEPARIN SOD (PORK) LOCK FLUSH 100 UNIT/ML IV SOLN
500.0000 [IU] | Freq: Once | INTRAVENOUS | Status: AC
  Administered 2022-07-30: 500 [IU] via INTRAVENOUS

## 2022-07-30 NOTE — Patient Instructions (Addendum)
[place] at [facility] Discharge Instructions   You were seen and examined today by Dr. Delton Coombes.  He reviewed the results of your lab work which are normal/stable mostly.   We will hold treatment today.  You have lost six pounds since we saw you last.   We will make a home health referral for you.   Return as scheduled in 4 weeks.    Thank you for choosing [place] at [facility] to provide your oncology and hematology care.  To afford each patient quality time with our provider, please arrive at least 15 minutes before your scheduled appointment time.   If you have a lab appointment with the [place] please come in thru the Main Entrance and check in at the main information desk.  You need to re-schedule your appointment should you arrive 10 or more minutes late.  We strive to give you quality time with our providers, and arriving late affects you and other patients whose appointments are after yours.  Also, if you no show three or more times for appointments you may be dismissed from the clinic at the providers discretion.     Again, thank you for choosing [facility].  Our hope is that these requests will decrease the amount of time that you wait before being seen by our physicians.       _____________________________________________________________  Should you have questions after your visit to [facility], please contact our office at [phone] and follow the prompts.  Our office hours are 8:00 a.m. and 4:30 p.m. Monday - Friday.  Please note that voicemails left after 4:00 p.m. may not be returned until the following business day.  We are closed weekends and major holidays.  You do have access to a nurse 24-7, just call the main number to the clinic [phone] and do not press any options, hold on the line and a nurse will answer the phone.    For prescription refill requests, have your  pharmacy contact our office and allow 72 hours.    Due to Covid, you will need to wear a mask upon entering the hospital. If you do not have a mask, a mask will be given to you at the Main Entrance upon arrival. For doctor visits, patients may have 1 support person age 91 or older with them. For treatment visits, patients can not have anyone with them due to social distancing guidelines and our immunocompromised population.

## 2022-07-30 NOTE — Progress Notes (Signed)
[place] [address] [place] [address]   CLINIC:  Medical Oncology/Hematology  Patient Care Team: [name], MD as PCP - General (Family Medicine) Minus Breeding, MD as Consulting Physician (Cardiology) McKenzie, Candee Furbish, MD as Consulting Physician (Urology) Lavera Guise, [facility] (Pharmacist) Steffanie Rainwater, DPM as Consulting Physician (Podiatry) Pyrtle, Lajuan Lines, MD as Consulting Physician (Gastroenterology) Harlen Labs, MD as Referring Physician (Optometry) Derek Jack, MD as Medical Oncologist (Medical Oncology) Barbaraann Faster, RN as [place] [facility] Stacks, Cletus Gash, MD as Referring Physician (Family Medicine)    REASON FOR VISIT:  Follow-up for 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  PRIOR THERAPY: neoadjuvant hormonal therapy, EBRT followed by brachytherapy with iodine 125 seeds in 2010 Lupron injection (started 08/10/2019) and enzalutamide 160 mg daily (started 04/17/2020)  NGS Results: could not be done because of less than 100 tumor cells  CURRENT THERAPY: Docetaxel (75) + Prednisone q21d; PROSTATE LEUPROLIDE (ELIGARD) Q6 MONTH & IV FLUIDS & ANTIEMETICS   BRIEF ONCOLOGIC HISTORY:  Oncology History  Prostate cancer ([place])  04/07/2014 Initial Diagnosis   Prostate cancer metastatic to intrapelvic lymph node ([place])    Genetic Testing   [facility] Multi-Cancer Panel was Negative. Report date is 05/07/2021.  The Multi-Cancer + RNA Panel offered by [facility] includes sequencing and/or deletion/duplication analysis of the following 84 genes:  AIP*, ALK, APC*, ATM*, AXIN2*, BAP1*, BARD1*, BLM*, BMPR1A*, BRCA1*, BRCA2*, BRIP1*, CASR, CDC73*, CDH1*, CDK4, CDKN1B*, CDKN1C*, CDKN2A, CEBPA, CHEK2*, CTNNA1*, DICER1*, DIS3L2*, EGFR, EPCAM, FH*, FLCN*, GATA2*, GPC3, GREM1, HOXB13, HRAS, KIT, MAX*, MEN1*, MET, MITF, MLH1*, MSH2*, MSH3*, MSH6*, MUTYH*, NBN*, NF1*, NF2*, NTHL1*, PALB2*, PDGFRA,  PHOX2B, PMS2*, POLD1*, POLE*, POT1*, PRKAR1A*, PTCH1*, PTEN*, RAD50*, RAD51C*, RAD51D*, RB1*, RECQL4, RET, RUNX1*, SDHA*, SDHAF2*, SDHB*, SDHC*, SDHD*, SMAD4*, SMARCA4*, SMARCB1*, SMARCE1*, STK11*, SUFU*, TERC, TERT, TMEM127*, Tp53*, TSC1*, TSC2*, VHL*, WRN*, and WT1.  RNA analysis is performed for * genes.   10/21/2021 - 02/12/2022 Chemotherapy   Patient is on Treatment Plan : PROSTATE Docetaxel + Prednisone q21d     10/21/2021 -  Chemotherapy   Patient is on Treatment Plan : PROSTATE Docetaxel (75) + Prednisone q21d       CANCER STAGING:  Cancer Staging  Prostate cancer [facility]) Staging form: Prostate, [name] 7th Edition - Clinical stage from 04/02/2021: rT2b, N1, PSA: 10 to 19, Gleason 7 - Unsigned   INTERVAL HISTORY:  [name], a 77 y.o. male, seen for follow-up of metastatic prostate cancer.  He is here for toxicity assessment prior to cycle 9 of Docetaxel. He was last seen by me on 07/02/22.  Today, he states that he is doing well overall. His appetite level is at 80%. He states that his appetite waxes and wanes. He has lost 9lbs since 03/2022. His energy level is at 75%. He states that he needs assistance with ADLs such as showering, changing his bed sheets, and sometimes with getting up from sitting. He had a fall a few days ago wherein he required his wife to help him get back into bed. His wife attributes his falls to his generalized weakness. He reports occasional dysuria. He denies any new pains, ankle swelling, chest pain, or lightheadedness.   REVIEW OF SYSTEMS:  Review of Systems  Constitutional:  Positive for appetite change, fatigue and unexpected weight change. Negative for chills and fever.  HENT:   Negative for lump/mass, mouth sores, nosebleeds, sore throat and trouble swallowing.   Respiratory:  Negative for cough and shortness of breath.   Cardiovascular:  Negative for chest pain, leg swelling and palpitations.  Gastrointestinal:  Negative for abdominal pain,  constipation, diarrhea, nausea and vomiting.  Genitourinary:  Positive for dysuria. Negative for bladder incontinence, difficulty urinating, frequency, hematuria and nocturia.   Musculoskeletal:  Negative for arthralgias, back pain, flank pain, myalgias and neck pain.       + falls  Skin:  Negative for itching and rash.  Neurological:  Positive for extremity weakness (BLE). Negative for dizziness, headaches, light-headedness and numbness.  Hematological:  Does not bruise/bleed easily.  Psychiatric/Behavioral:  Negative for depression, sleep disturbance and suicidal ideas. The patient is not nervous/anxious.   All other systems reviewed and are negative.   PAST MEDICAL/SURGICAL HISTORY:  Past Medical History:  Diagnosis Date   AKI (acute kidney injury) ([place]) 10/04/2021   Anemia    BPH (benign prostatic hyperplasia)    Sees Dr [name]   Cataract    Diabetes mellitus without complication [facility])    Educated about COVID-19 virus infection 07/18/2019   Genetic testing 05/09/2021   [facility] Multi-Cancer Panel was Negative. Report date is 05/07/2021.  The Multi-Cancer + RNA Panel offered by [facility] includes sequencing and/or deletion/duplication analysis of the following 84 genes:  AIP*, ALK, APC*, ATM*, AXIN2*, BAP1*, BARD1*, BLM*, BMPR1A*, BRCA1*, BRCA2*, BRIP1*, CASR, CDC73*, CDH1*, CDK4, CDKN1B*, CDKN1C*, CDKN2A, CEBPA, CHEK2*, CTNNA1*, DICER1*, DIS3L2*, EGFR, EPCAM, FH*, F   Hyperlipidemia    Hypertension    Low serum vitamin D    Port-A-Cath in place 10/09/2021   Prostate cancer [facility]) 2008   w/seed implantation and radiation   Past Surgical History:  Procedure Laterality Date   COLONOSCOPY  2015,2018   PORTACATH PLACEMENT Right 10/17/2021   Procedure: INSERTION PORT-A-CATH, [name];  Surgeon: [name], DO;  Location: [name];  Service: General;  Laterality: Right;  pt needs to have glucose checked AM of surgery   PROSTATE SURGERY  2008   seed implant    SOCIAL HISTORY:  Social  History   Socioeconomic History   Marital status: Married    Spouse name: Rudd   Number of children: 2   Years of education: 12   Highest education level: 12th grade  Occupational History   Occupation: [name]    Comment: Retired Geologist, engineering  Tobacco Use   Smoking status: Every Day    Packs/day: 1.00    Years: 55.00    Total pack years: 55.00    Types: Cigarettes    Start date: 06/30/1965   Smokeless tobacco: Never   Tobacco comments:    Has tried nicotine replacement gum  Vaping Use   Vaping Use: Never used  Substance and Sexual Activity   Alcohol use: No   Drug use: No   Sexual activity: Yes  Other Topics Concern   Not on file  Social History Narrative   Retired, lives at home with wife [name], two grown children, enjoys gardening     Social Determinants of Health   Financial Resource Strain: Medium Risk (04/14/2022)   Overall Financial Resource Strain (CARDIA)    Difficulty of Paying Living Expenses: Somewhat hard  Food Insecurity: No Food Insecurity (03/07/2022)   Hunger Vital Sign    Worried About Running Out of Food in the Last Year: Never true    [place] in the Last Year: Never true  Transportation Needs: No Transportation Needs (03/07/2022)   PRAPARE - Transportation    Lack of  Transportation (Medical): No    Lack of Transportation (Non-Medical): No  Physical Activity: Inactive (01/27/2022)   Exercise Vital Sign    Days of Exercise per Week: 0 days    Minutes of Exercise per Session: 0 min  Stress: No Stress Concern Present (03/07/2022)   [place]    Feeling of Stress : Only a little  Social Connections: Moderately Integrated (01/27/2022)   Social Connection and Isolation Panel [NHANES]    Frequency of Communication with Friends and Family: More than three times a week    Frequency of Social Gatherings with Friends and Family: Once a week    Attends Religious Services: More than 4  times per year    Active Member of [employer] or Organizations: No    Attends Archivist Meetings: Never    Marital Status: Married  Human resources officer Violence: Not At Risk (01/27/2022)   Humiliation, Afraid, Rape, and Kick questionnaire    Fear of Current or Ex-Partner: No    Emotionally Abused: No    Physically Abused: No    Sexually Abused: No    FAMILY HISTORY:  Family History  Problem Relation Age of Onset   Diabetes Mother    Heart disease Mother    Kidney disease Mother        DIALYSIS   Emphysema Father    Deep vein thrombosis Sister    Kidney disease Sister    Lung cancer Sister    Colon polyps Brother    Hypertension Brother    Gout Brother    Colon cancer Neg Hx    Esophageal cancer Neg Hx    Rectal cancer Neg Hx    Prostate cancer Neg Hx     CURRENT MEDICATIONS:  Current Outpatient Medications  Medication Sig Dispense Refill   acetaminophen (TYLENOL) 650 MG CR tablet Take 1,300 mg by mouth every 8 (eight) hours as needed for pain.     alfuzosin (UROXATRAL) 10 MG 24 hr tablet Take 1 tablet (10 mg total) by mouth at bedtime. 90 tablet 3   ammonium lactate (AMLACTIN) 12 % cream      aspirin 81 MG tablet Take 1 tablet (81 mg total) by mouth daily with breakfast. 30 tablet 3   atorvastatin (LIPITOR) 40 MG tablet Take 1 tablet (40 mg total) by mouth daily. (Patient taking differently: Take 40 mg by mouth at bedtime.) 90 tablet 3   BOOSTRIX 5-2.5-18.5 LF-MCG/0.5 injection      cholecalciferol (VITAMIN D) 1000 UNITS tablet Take 1,000 Units by mouth daily.      ciprofloxacin (CIPRO) 500 MG tablet Take 1 tablet (500 mg total) by mouth 2 (two) times daily. 10 tablet 0   Dapagliflozin-sAXagliptin (QTERN) 10-5 MG TABS Take 1 tablet by mouth in the morning. 90 tablet 5   DOCEtaxel (TAXOTERE IV) Inject into the vein every 21 ( twenty-one) days.     docusate sodium (COLACE) 100 MG capsule Take 1 capsule (100 mg total) by mouth 2 (two) times daily. (Patient taking  differently: Take 100 mg by mouth daily as needed for mild constipation.) 60 capsule 2   escitalopram (LEXAPRO) 10 MG tablet Take 1 tablet (10 mg total) by mouth daily. 90 tablet 1   glimepiride (AMARYL) 2 MG tablet Take 2 mg by mouth 2 (two) times daily.     glucose blood test strip 1 each by Other route as needed for other. Use as instructed to test blood sugar daily.  One Touch Verio Test Strips DX: E11.65 100 each 11   lisinopril-hydrochlorothiazide (ZESTORETIC) 20-25 MG tablet Take 1 tablet by mouth daily. 90 tablet 3   loperamide (IMODIUM) 2 MG capsule Take 1 capsule (2 mg total) by mouth as needed for diarrhea or loose stools. 30 capsule 0   magnesium oxide (MAG-OX) 400 (240 Mg) MG tablet Take 1 tablet (400 mg total) by mouth 2 (two) times daily. 60 tablet 2   megestrol (MEGACE) 400 MG/10ML suspension Take 10 mLs (400 mg total) by mouth 2 (two) times daily. 480 mL 4   metFORMIN (GLUCOPHAGE) 1000 MG tablet Take 1 tablet (1,000 mg total) by mouth 2 (two) times daily with a meal. 180 tablet 2   oxyCODONE (ROXICODONE) 5 MG immediate release tablet Take 1 tablet (5 mg total) by mouth every 8 (eight) hours as needed. 10 tablet 0   potassium chloride SA (KLOR-CON M) 20 MEQ tablet Take 1 tablet (20 mEq total) by mouth daily. 30 tablet 6   pramoxine (PROCTOFOAM) 1 % foam Place 1 application. rectally 3 (three) times daily as needed for anal itching. 15 g 0   No current facility-administered medications for this visit.   Facility-Administered Medications Ordered in Other Visits  Medication Dose Route Frequency Provider Last Rate Last Admin   0.9 %  sodium chloride infusion   Intravenous Continuous Derek Jack, MD 20 mL/hr at 01/08/22 1513 Infusion Verify at 01/08/22 1513    ALLERGIES:  No Known Allergies  PHYSICAL EXAM:  Performance status (ECOG): 1 - Symptomatic but completely ambulatory  There were no vitals filed for this visit.   Wt Readings from Last 3 Encounters:  07/30/22  74.3 kg (163 lb 12.8 oz)  07/02/22 77 kg (169 lb 12.8 oz)  06/11/22 79 kg (174 lb 2.6 oz)   Physical Exam Vitals reviewed.  Constitutional:      Appearance: Normal appearance.  Cardiovascular:     Rate and Rhythm: Normal rate and regular rhythm.     Pulses: Normal pulses.     Heart sounds: Normal heart sounds.  Pulmonary:     Effort: Pulmonary effort is normal.     Breath sounds: Normal breath sounds.  Musculoskeletal:     Right lower leg: No edema.     Left lower leg: No edema.  Neurological:     General: No focal deficit present.     Mental Status: He is alert and oriented to person, place, and time.  Psychiatric:        Mood and Affect: Mood normal.        Behavior: Behavior normal.     LABORATORY DATA:  I have reviewed the labs as listed.     Latest Ref Rng & Units 07/30/2022   10:21 AM 07/02/2022   12:30 PM 06/11/2022    7:47 AM  CBC  WBC 4.0 - 10.5 K/uL 6.9  8.3  8.7   Hemoglobin 13.0 - 17.0 g/dL 9.6  9.3  9.4   Hematocrit 39.0 - 52.0 % 29.3  28.5  28.6   Platelets 150 - 400 K/uL 243  315  245       Latest Ref Rng & Units 07/30/2022   10:21 AM 07/02/2022   12:30 PM 06/11/2022    7:47 AM  CMP  Glucose 70 - 99 mg/dL 101  107  124   BUN 8 - 23 mg/dL '14  17  22   '$ Creatinine 0.61 - 1.24 mg/dL 1.23  1.62  1.58  Sodium 135 - 145 mmol/L 136  132  136   Potassium 3.5 - 5.1 mmol/L 3.2  3.8  3.2   Chloride 98 - 111 mmol/L 108  106  106   CO2 22 - 32 mmol/L '20  17  21   '$ Calcium 8.9 - 10.3 mg/dL 8.0  8.2  8.1   Total Protein 6.5 - 8.1 g/dL 6.2  6.4  6.3   Total Bilirubin 0.3 - 1.2 mg/dL 0.5  0.5  0.5   Alkaline Phos 38 - 126 U/L 71  61  78   AST 15 - 41 U/L '19  16  18   '$ ALT 0 - 44 U/L '13  11  12     '$ DIAGNOSTIC IMAGING:  I have independently reviewed the scans and discussed with the patient. No results found.   ASSESSMENT:  Metastatic castration refractory prostate cancer to left iliac lymph node: - History of prostate adenocarcinoma, T2b, Gleason 7, PSA 16.6 -  Treated with neoadjuvant hormonal therapy, EBRT followed by brachytherapy with iodine 125 seeds in 2010 - Rising PSA levels in February 2019 - Bone scan on 08/10/2017 negative.  CTAP on 08/26/2017 shows enlarged left pelvic sidewall lymph node 2.2 cm. - Lupron initiated on 08/10/2019. - CTAP on 03/09/2020 with left internal/external iliac lymph node measuring 2.1 x 2.8 cm (2.1 x 2.2 cm), craniocaudal 4.1 cm, previously 3.4 cm. - Enzalutamide to 160 mg daily started on 04/17/2020.  Discontinued end of October 2022. - PSA on 02/20/2021 (5.2), 07/11/2020 (2.0), 02/08/2020 (3.1) - CTAP on 03/07/2021 with left iliac bifurcation lymph node measuring 3.5 x 2.6 cm (previously 2.8 x 2.0 cm) - Bone scan on 03/07/2021 negative. - Left pelvic lymph node biopsy on 04/08/2021, metastatic prostate cancer. - We have reviewed PSMA PET scan results which showed enlarged left iliac lymph node 33 mm with SUV 285.  1 mm lymph node and 4 mm node between IVC and aorta.  Activity in the right acetabulum, left iliac bone, left transverse process of T11 vertebral body, spine of the right scapula. - Guardant 360 did not show any targetable mutations.  Did not show MSI high. - NGS testing could not be done because of less than 100 tumor cells. - Abiraterone and prednisone from 07/15/2021 through 10/01/2021 with progression. - Germline mutation [facility] testing was negative. - Cycle 1 of docetaxel on 10/21/2021.   PLAN:  Metastatic castration refractory prostate cancer to the left iliac lymph node: - CTA PE on 06/02/2022: Stable bulky lymph nodes at the left iliac bifurcation with no evidence of new adenopathy. - Bone scan on 05/20/2022 with no evidence of bone metastasis. - PSA has been stable around 9.5 the last 2 times. - Labs today shows normal LFTs with low albumin.  CBC was grossly normal. - He reportedly fell on Sunday and thinks weakness made him fall. - Due to his falls, weight loss and weakness, he needs evaluation by home  health to determine further needs and rehab.  Will make a referral to home health. - I will hold his treatment today.  I will reevaluate him in 4 weeks with repeat PSA.  2.  Weight loss: - He is taking Megace twice daily.  He lost 6 pounds in the last 4 weeks.  I am holding his treatments.  3.  Normocytic anemia: - He has completed 3 doses of Venofer.  Last ferritin was 452 and percent saturation 30.  Hemoglobin today is 9.6.  This is partly from myelosuppression.  4.  Bone metastasis: - We will plan on denosumab after dental evaluation.  5.  Hypomagnesemia/hypokalemia: - Continue potassium 20 mEq daily and magnesium twice daily.  Potassium is slightly low today at 3.2.  He has not taken his medication today.  He will go home and take it.   Orders placed this encounter:  No orders of the defined types were placed in this encounter.    I,Alexis Herring,acting as a Education administrator for [employer], MD.,have documented all relevant documentation on the behalf of Derek Jack, MD,as directed by  Derek Jack, MD while in the presence of Derek Jack, MD.  I, Derek Jack MD, have reviewed the above documentation for accuracy and completeness, and I agree with the above.   Derek Jack, MD [place] [phone]

## 2022-07-30 NOTE — Progress Notes (Signed)
Treatment held today due to 6lb weight loss and patient falling. Return to clinic in 4 weeks for possible treatment and f/u with Dr. Delton Coombes /  A. [place] Therapist, sports by [employer].

## 2022-07-31 ENCOUNTER — Ambulatory Visit: Payer: Medicare Other

## 2022-07-31 ENCOUNTER — Ambulatory Visit: Payer: Medicare Other | Admitting: Hematology

## 2022-07-31 ENCOUNTER — Other Ambulatory Visit: Payer: Medicare Other

## 2022-07-31 DIAGNOSIS — C7951 Secondary malignant neoplasm of bone: Secondary | ICD-10-CM | POA: Diagnosis not present

## 2022-07-31 DIAGNOSIS — C775 Secondary and unspecified malignant neoplasm of intrapelvic lymph nodes: Secondary | ICD-10-CM | POA: Diagnosis not present

## 2022-07-31 DIAGNOSIS — E1151 Type 2 diabetes mellitus with diabetic peripheral angiopathy without gangrene: Secondary | ICD-10-CM | POA: Diagnosis not present

## 2022-07-31 DIAGNOSIS — D630 Anemia in neoplastic disease: Secondary | ICD-10-CM | POA: Diagnosis not present

## 2022-07-31 DIAGNOSIS — I700 Atherosclerosis of aorta: Secondary | ICD-10-CM | POA: Diagnosis not present

## 2022-08-01 ENCOUNTER — Ambulatory Visit: Payer: Medicare Other

## 2022-08-01 NOTE — Progress Notes (Signed)
Verbal order given to Irineo Axon for physical therapy and home health per Dr. Tomie China orders.

## 2022-08-04 ENCOUNTER — Other Ambulatory Visit: Payer: Self-pay | Admitting: *Deleted

## 2022-08-04 DIAGNOSIS — E860 Dehydration: Secondary | ICD-10-CM

## 2022-08-04 DIAGNOSIS — C61 Malignant neoplasm of prostate: Secondary | ICD-10-CM

## 2022-08-04 NOTE — Progress Notes (Signed)
Referral placed to [facility].  PT referral was accepted last week and he has been seen.

## 2022-08-05 DIAGNOSIS — E1151 Type 2 diabetes mellitus with diabetic peripheral angiopathy without gangrene: Secondary | ICD-10-CM | POA: Diagnosis not present

## 2022-08-05 DIAGNOSIS — C775 Secondary and unspecified malignant neoplasm of intrapelvic lymph nodes: Secondary | ICD-10-CM | POA: Diagnosis not present

## 2022-08-05 DIAGNOSIS — I700 Atherosclerosis of aorta: Secondary | ICD-10-CM | POA: Diagnosis not present

## 2022-08-05 DIAGNOSIS — C7951 Secondary malignant neoplasm of bone: Secondary | ICD-10-CM | POA: Diagnosis not present

## 2022-08-05 DIAGNOSIS — D630 Anemia in neoplastic disease: Secondary | ICD-10-CM | POA: Diagnosis not present

## 2022-08-07 DIAGNOSIS — E1151 Type 2 diabetes mellitus with diabetic peripheral angiopathy without gangrene: Secondary | ICD-10-CM | POA: Diagnosis not present

## 2022-08-07 DIAGNOSIS — D630 Anemia in neoplastic disease: Secondary | ICD-10-CM | POA: Diagnosis not present

## 2022-08-07 DIAGNOSIS — C775 Secondary and unspecified malignant neoplasm of intrapelvic lymph nodes: Secondary | ICD-10-CM | POA: Diagnosis not present

## 2022-08-07 DIAGNOSIS — C7951 Secondary malignant neoplasm of bone: Secondary | ICD-10-CM | POA: Diagnosis not present

## 2022-08-07 DIAGNOSIS — I700 Atherosclerosis of aorta: Secondary | ICD-10-CM | POA: Diagnosis not present

## 2022-08-08 ENCOUNTER — Encounter: Payer: Self-pay | Admitting: Hematology

## 2022-08-08 DIAGNOSIS — I700 Atherosclerosis of aorta: Secondary | ICD-10-CM | POA: Diagnosis not present

## 2022-08-08 DIAGNOSIS — D630 Anemia in neoplastic disease: Secondary | ICD-10-CM | POA: Diagnosis not present

## 2022-08-08 DIAGNOSIS — C7951 Secondary malignant neoplasm of bone: Secondary | ICD-10-CM | POA: Diagnosis not present

## 2022-08-08 DIAGNOSIS — E1151 Type 2 diabetes mellitus with diabetic peripheral angiopathy without gangrene: Secondary | ICD-10-CM | POA: Diagnosis not present

## 2022-08-08 DIAGNOSIS — C775 Secondary and unspecified malignant neoplasm of intrapelvic lymph nodes: Secondary | ICD-10-CM | POA: Diagnosis not present

## 2022-08-09 ENCOUNTER — Other Ambulatory Visit: Payer: Self-pay

## 2022-08-12 DIAGNOSIS — D630 Anemia in neoplastic disease: Secondary | ICD-10-CM | POA: Diagnosis not present

## 2022-08-12 DIAGNOSIS — E1151 Type 2 diabetes mellitus with diabetic peripheral angiopathy without gangrene: Secondary | ICD-10-CM | POA: Diagnosis not present

## 2022-08-12 DIAGNOSIS — C7951 Secondary malignant neoplasm of bone: Secondary | ICD-10-CM | POA: Diagnosis not present

## 2022-08-12 DIAGNOSIS — C775 Secondary and unspecified malignant neoplasm of intrapelvic lymph nodes: Secondary | ICD-10-CM | POA: Diagnosis not present

## 2022-08-12 DIAGNOSIS — I700 Atherosclerosis of aorta: Secondary | ICD-10-CM | POA: Diagnosis not present

## 2022-08-12 NOTE — Telephone Encounter (Signed)
Rescheduled 08/22/22  [facility] [place]  Direct Dial: [phone]

## 2022-08-14 DIAGNOSIS — C775 Secondary and unspecified malignant neoplasm of intrapelvic lymph nodes: Secondary | ICD-10-CM | POA: Diagnosis not present

## 2022-08-14 DIAGNOSIS — I700 Atherosclerosis of aorta: Secondary | ICD-10-CM | POA: Diagnosis not present

## 2022-08-14 DIAGNOSIS — D630 Anemia in neoplastic disease: Secondary | ICD-10-CM | POA: Diagnosis not present

## 2022-08-14 DIAGNOSIS — C7951 Secondary malignant neoplasm of bone: Secondary | ICD-10-CM | POA: Diagnosis not present

## 2022-08-14 DIAGNOSIS — E1151 Type 2 diabetes mellitus with diabetic peripheral angiopathy without gangrene: Secondary | ICD-10-CM | POA: Diagnosis not present

## 2022-08-15 DIAGNOSIS — E1151 Type 2 diabetes mellitus with diabetic peripheral angiopathy without gangrene: Secondary | ICD-10-CM | POA: Diagnosis not present

## 2022-08-15 DIAGNOSIS — I700 Atherosclerosis of aorta: Secondary | ICD-10-CM | POA: Diagnosis not present

## 2022-08-15 DIAGNOSIS — D630 Anemia in neoplastic disease: Secondary | ICD-10-CM | POA: Diagnosis not present

## 2022-08-15 DIAGNOSIS — C775 Secondary and unspecified malignant neoplasm of intrapelvic lymph nodes: Secondary | ICD-10-CM | POA: Diagnosis not present

## 2022-08-15 DIAGNOSIS — C7951 Secondary malignant neoplasm of bone: Secondary | ICD-10-CM | POA: Diagnosis not present

## 2022-08-18 ENCOUNTER — Telehealth: Payer: Self-pay | Admitting: Family Medicine

## 2022-08-18 DIAGNOSIS — D630 Anemia in neoplastic disease: Secondary | ICD-10-CM | POA: Diagnosis not present

## 2022-08-18 DIAGNOSIS — C7951 Secondary malignant neoplasm of bone: Secondary | ICD-10-CM | POA: Diagnosis not present

## 2022-08-18 DIAGNOSIS — C775 Secondary and unspecified malignant neoplasm of intrapelvic lymph nodes: Secondary | ICD-10-CM | POA: Diagnosis not present

## 2022-08-18 DIAGNOSIS — E1151 Type 2 diabetes mellitus with diabetic peripheral angiopathy without gangrene: Secondary | ICD-10-CM | POA: Diagnosis not present

## 2022-08-18 DIAGNOSIS — I700 Atherosclerosis of aorta: Secondary | ICD-10-CM | POA: Diagnosis not present

## 2022-08-19 ENCOUNTER — Ambulatory Visit (INDEPENDENT_AMBULATORY_CARE_PROVIDER_SITE_OTHER): Payer: Medicare Other | Admitting: Family Medicine

## 2022-08-19 ENCOUNTER — Encounter: Payer: Self-pay | Admitting: Family Medicine

## 2022-08-19 VITALS — BP 118/74 | HR 100 | Temp 97.4°F | Ht 66.54 in | Wt 166.0 lb

## 2022-08-19 DIAGNOSIS — R2689 Other abnormalities of gait and mobility: Secondary | ICD-10-CM | POA: Diagnosis not present

## 2022-08-19 DIAGNOSIS — E1169 Type 2 diabetes mellitus with other specified complication: Secondary | ICD-10-CM | POA: Diagnosis not present

## 2022-08-19 DIAGNOSIS — E559 Vitamin D deficiency, unspecified: Secondary | ICD-10-CM | POA: Diagnosis not present

## 2022-08-19 DIAGNOSIS — E785 Hyperlipidemia, unspecified: Secondary | ICD-10-CM

## 2022-08-19 DIAGNOSIS — M461 Sacroiliitis, not elsewhere classified: Secondary | ICD-10-CM

## 2022-08-19 DIAGNOSIS — E118 Type 2 diabetes mellitus with unspecified complications: Secondary | ICD-10-CM | POA: Diagnosis not present

## 2022-08-19 DIAGNOSIS — N401 Enlarged prostate with lower urinary tract symptoms: Secondary | ICD-10-CM

## 2022-08-19 DIAGNOSIS — I10 Essential (primary) hypertension: Secondary | ICD-10-CM | POA: Diagnosis not present

## 2022-08-19 DIAGNOSIS — N138 Other obstructive and reflux uropathy: Secondary | ICD-10-CM | POA: Diagnosis not present

## 2022-08-19 LAB — LIPID PANEL

## 2022-08-19 LAB — BAYER DCA HB A1C WAIVED: HB A1C (BAYER DCA - WAIVED): 5.9 % — ABNORMAL HIGH (ref 4.8–5.6)

## 2022-08-19 MED ORDER — LISINOPRIL-HYDROCHLOROTHIAZIDE 20-25 MG PO TABS: 1.0000 | ORAL_TABLET | Freq: Every day | ORAL | 3 refills | Status: DC

## 2022-08-19 MED ORDER — ATORVASTATIN CALCIUM 40 MG PO TABS: 40.0000 mg | ORAL_TABLET | Freq: Every day | ORAL | 3 refills | Status: DC

## 2022-08-19 MED ORDER — MEGESTROL ACETATE 400 MG/10ML PO SUSP: 400.0000 mg | Freq: Two times a day (BID) | ORAL | 4 refills | Status: DC

## 2022-08-19 MED ORDER — POTASSIUM CHLORIDE CRYS ER 20 MEQ PO TBCR: 20.0000 meq | EXTENDED_RELEASE_TABLET | Freq: Every day | ORAL | 3 refills | Status: DC

## 2022-08-19 MED ORDER — GLYXAMBI 25-5 MG PO TABS: 1.0000 | ORAL_TABLET | Freq: Every day | ORAL | 3 refills | Status: DC

## 2022-08-19 MED ORDER — ALFUZOSIN HCL ER 10 MG PO TB24: 10.0000 mg | ORAL_TABLET | Freq: Every day | ORAL | 3 refills | Status: DC

## 2022-08-19 MED ORDER — METFORMIN HCL 1000 MG PO TABS: 1000.0000 mg | ORAL_TABLET | Freq: Two times a day (BID) | ORAL | 2 refills | Status: DC

## 2022-08-19 MED ORDER — DOCUSATE SODIUM 100 MG PO CAPS: 100.0000 mg | ORAL_CAPSULE | Freq: Two times a day (BID) | ORAL | 2 refills | Status: DC

## 2022-08-19 MED ORDER — ESCITALOPRAM OXALATE 10 MG PO TABS: 10.0000 mg | ORAL_TABLET | Freq: Every day | ORAL | 1 refills | Status: DC

## 2022-08-19 NOTE — Progress Notes (Signed)
Subjective:  Patient ID: Andrew [name],  male    DOB: 1945-11-21  Age: 77 y.o.    CC: Medical Management of Chronic Issues   HPI Andrew [name] presents for  follow-up of hypertension. Patient has no history of headache chest pain or shortness of breath or recent cough. Patient also denies symptoms of TIA such as numbness weakness lateralizing. Patient denies side effects from medication. States taking it regularly.  Patient also  in for follow-up of elevated cholesterol. Doing well without complaints on current medication. Denies side effects  including myalgia and arthralgia and nausea. Also in today for liver function testing. Currently no chest pain, shortness of breath or other cardiovascular related symptoms noted.  Follow-up of diabetes. Patient does not check blood sugar at home. Patient denies symptoms such as excessive hunger or urinary frequency, excessive hunger, nausea No significant hypoglycemic spells noted. Medications reviewed. Pt reports taking them regularly. Pt. denies complication/adverse reaction today.    History Andrew [name] has a past medical history of AKI (acute kidney injury) ([place]) (10/04/2021), Anemia, BPH (benign prostatic hyperplasia), Cataract, Diabetes mellitus without complication ([place]), Educated about COVID-19 virus infection (07/18/2019), Genetic testing (05/09/2021), Hyperlipidemia, Hypertension, Low serum vitamin D, Port-A-Cath in place (10/09/2021), and Prostate cancer ([place]) (2008).   He has a past surgical history that includes Prostate surgery (2008); Colonoscopy [id number]); and Portacath placement (Right, 10/17/2021).   His family history includes Colon polyps in his brother; Deep vein thrombosis in his sister; Diabetes in his mother; Emphysema in his father; Gout in his brother; Heart disease in his mother; Hypertension in his brother; Kidney disease in his mother and sister; Lung cancer in his sister.He reports that he has been smoking cigarettes. He started  smoking about 57 years ago. He has a 55.00 pack-year smoking history. He has never used smokeless tobacco. He reports that he does not drink alcohol and does not use drugs.  Current Outpatient Medications on File Prior to Visit  Medication Sig Dispense Refill   acetaminophen (TYLENOL) 650 MG CR tablet Take 1,300 mg by mouth every 8 (eight) hours as needed for pain.     aspirin 81 MG tablet Take 1 tablet (81 mg total) by mouth daily with breakfast. 30 tablet 3   cholecalciferol (VITAMIN D) 1000 UNITS tablet Take 1,000 Units by mouth daily.      DOCEtaxel (TAXOTERE IV) Inject into the vein every 21 ( twenty-one) days.     glucose blood test strip 1 each by Other route as needed for other. Use as instructed to test blood sugar daily.  One Touch Verio Test Strips DX: E11.65 100 each 11   [DISCONTINUED] prochlorperazine (COMPAZINE) 10 MG tablet Take 1 tablet (10 mg total) by mouth every 6 (six) hours as needed (Nausea or vomiting). 30 tablet 2   Current Facility-Administered Medications on File Prior to Visit  Medication Dose Route Frequency Provider Last Rate Last Admin   0.9 %  sodium chloride infusion   Intravenous Continuous Derek Jack, MD 20 mL/hr at 01/08/22 1513 Infusion Verify at 01/08/22 1513    ROS Review of Systems  Constitutional:  Negative for fever.  Respiratory:  Negative for shortness of breath.   Cardiovascular:  Negative for chest pain.  Musculoskeletal:  Negative for arthralgias.  Skin:  Negative for rash.    Objective:  BP 118/74   Pulse 100   Temp (!) 97.4 F (36.3 C)   Ht 5' 6.54" (1.69 m)   Wt 166 lb (75.3 kg)  SpO2 99%   BMI 26.36 kg/m   BP Readings from Last 3 Encounters:  08/19/22 118/74  07/30/22 134/79  07/04/22 124/88    Wt Readings from Last 3 Encounters:  08/19/22 166 lb (75.3 kg)  07/30/22 163 lb 12.8 oz (74.3 kg)  07/02/22 169 lb 12.8 oz (77 kg)     Physical Exam Vitals reviewed.  Constitutional:      Appearance: He is  well-developed.  HENT:     Head: Normocephalic and atraumatic.     Right Ear: External ear normal.     Left Ear: External ear normal.     Mouth/Throat:     Pharynx: No oropharyngeal exudate or posterior oropharyngeal erythema.  Eyes:     Pupils: Pupils are equal, round, and reactive to light.  Cardiovascular:     Rate and Rhythm: Normal rate and regular rhythm.     Heart sounds: No murmur heard. Pulmonary:     Effort: No respiratory distress.     Breath sounds: Normal breath sounds.  Musculoskeletal:     Cervical back: Normal range of motion and neck supple.  Neurological:     Mental Status: He is alert and oriented to person, place, and time.     Diabetic Foot Exam - Simple   No data filed     Lab Results  Component Value Date   HGBA1C 5.9 (H) 08/19/2022   HGBA1C 7.9 (H) 10/02/2021   HGBA1C 5.6 05/01/2021    Assessment & Plan:   Andrew [name] was seen today for medical management of chronic issues.  Diagnoses and all orders for this visit:  Type 2 diabetes mellitus with complication, without long-term current use of insulin (HCC) -     Bayer DCA Hb A1c Waived -     Microalbumin / creatinine urine ratio -     metFORMIN (GLUCOPHAGE) 1000 MG tablet; Take 1 tablet (1,000 mg total) by mouth 2 (two) times daily with a meal. -     Empagliflozin-linaGLIPtin (GLYXAMBI) 25-5 MG TABS; Take 1 tablet by mouth daily.  Essential hypertension -     CBC with Differential/Platelet -     CMP14+EGFR -     lisinopril-hydrochlorothiazide (ZESTORETIC) 20-25 MG tablet; Take 1 tablet by mouth daily.  Hyperlipidemia associated with type 2 diabetes mellitus (HCC) -     Lipid panel -     atorvastatin (LIPITOR) 40 MG tablet; Take 1 tablet (40 mg total) by mouth at bedtime.  Vitamin D deficiency -     VITAMIN D 25 Hydroxy (Vit-D Deficiency, Fractures)  Benign prostatic hyperplasia with urinary obstruction -     alfuzosin (UROXATRAL) 10 MG 24 hr tablet; Take 1 tablet (10 mg total) by mouth at  bedtime.  Sacroiliitis ([place]) -     DME Walker platform  Imbalance -     DME Walker platform  Other orders -     docusate sodium (COLACE) 100 MG capsule; Take 1 capsule (100 mg total) by mouth 2 (two) times daily. -     escitalopram (LEXAPRO) 10 MG tablet; Take 1 tablet (10 mg total) by mouth daily. -     megestrol (MEGACE) 400 MG/10ML suspension; Take 10 mLs (400 mg total) by mouth 2 (two) times daily. -     potassium chloride SA (KLOR-CON M) 20 MEQ tablet; Take 1 tablet (20 mEq total) by mouth daily.   I have discontinued [place] oxyCODONE, Boostrix, ammonium lactate, pramoxine, glimepiride, loperamide, magnesium oxide, and ciprofloxacin. I have changed his [facility]  to [facility]. I have also changed his atorvastatin. Additionally, I am having him maintain his cholecalciferol, glucose blood, DOCEtaxel (TAXOTERE IV), aspirin, acetaminophen, alfuzosin, docusate sodium, escitalopram, lisinopril-hydrochlorothiazide, megestrol, metFORMIN, and potassium chloride SA.  Meds ordered this encounter  Medications   alfuzosin (UROXATRAL) 10 MG 24 hr tablet    Sig: Take 1 tablet (10 mg total) by mouth at bedtime.    Dispense:  90 tablet    Refill:  3    Requesting 1 year supply   atorvastatin (LIPITOR) 40 MG tablet    Sig: Take 1 tablet (40 mg total) by mouth at bedtime.    Dispense:  90 tablet    Refill:  3    Requesting 1 year supply   docusate sodium (COLACE) 100 MG capsule    Sig: Take 1 capsule (100 mg total) by mouth 2 (two) times daily.    Dispense:  60 capsule    Refill:  2   escitalopram (LEXAPRO) 10 MG tablet    Sig: Take 1 tablet (10 mg total) by mouth daily.    Dispense:  90 tablet    Refill:  1   lisinopril-hydrochlorothiazide (ZESTORETIC) 20-25 MG tablet    Sig: Take 1 tablet by mouth daily.    Dispense:  90 tablet    Refill:  3    Requesting 1 year supply   megestrol (MEGACE) 400 MG/10ML suspension    Sig: Take 10 mLs (400 mg total) by mouth 2 (two) times daily.     Dispense:  480 mL    Refill:  4   metFORMIN (GLUCOPHAGE) 1000 MG tablet    Sig: Take 1 tablet (1,000 mg total) by mouth 2 (two) times daily with a meal.    Dispense:  180 tablet    Refill:  2   Empagliflozin-linaGLIPtin (GLYXAMBI) 25-5 MG TABS    Sig: Take 1 tablet by mouth daily.    Dispense:  90 tablet    Refill:  3   potassium chloride SA (KLOR-CON M) 20 MEQ tablet    Sig: Take 1 tablet (20 mEq total) by mouth daily.    Dispense:  90 tablet    Refill:  3     Follow-up: Return in about 3 months (around 11/17/2022).  [name], M.D.

## 2022-08-20 ENCOUNTER — Encounter: Payer: Self-pay | Admitting: Hematology

## 2022-08-20 DIAGNOSIS — E1151 Type 2 diabetes mellitus with diabetic peripheral angiopathy without gangrene: Secondary | ICD-10-CM | POA: Diagnosis not present

## 2022-08-20 DIAGNOSIS — D630 Anemia in neoplastic disease: Secondary | ICD-10-CM | POA: Diagnosis not present

## 2022-08-20 DIAGNOSIS — C7951 Secondary malignant neoplasm of bone: Secondary | ICD-10-CM | POA: Diagnosis not present

## 2022-08-20 DIAGNOSIS — I700 Atherosclerosis of aorta: Secondary | ICD-10-CM | POA: Diagnosis not present

## 2022-08-20 DIAGNOSIS — C775 Secondary and unspecified malignant neoplasm of intrapelvic lymph nodes: Secondary | ICD-10-CM | POA: Diagnosis not present

## 2022-08-20 LAB — CMP14+EGFR
ALT: 11 IU/L (ref 0–44)
AST: 22 IU/L (ref 0–40)
Albumin/Globulin Ratio: 1.1 — ABNORMAL LOW (ref 1.2–2.2)
Albumin: 3.5 g/dL — ABNORMAL LOW (ref 3.8–4.8)
Alkaline Phosphatase: 86 IU/L (ref 44–121)
BUN/Creatinine Ratio: 9 — ABNORMAL LOW (ref 10–24)
BUN: 14 mg/dL (ref 8–27)
Bilirubin Total: 0.4 mg/dL (ref 0.0–1.2)
CO2: 20 mmol/L (ref 20–29)
Calcium: 8.8 mg/dL (ref 8.6–10.2)
Chloride: 101 mmol/L (ref 96–106)
Creatinine, Ser: 1.48 mg/dL — ABNORMAL HIGH (ref 0.76–1.27)
Globulin, Total: 3.1 g/dL (ref 1.5–4.5)
Glucose: 109 mg/dL — ABNORMAL HIGH (ref 70–99)
Potassium: 4 mmol/L (ref 3.5–5.2)
Sodium: 137 mmol/L (ref 134–144)
Total Protein: 6.6 g/dL (ref 6.0–8.5)
eGFR: 49 mL/min/{1.73_m2} — ABNORMAL LOW (ref >59)

## 2022-08-20 LAB — LIPID PANEL
Chol/HDL Ratio: 4.4 ratio (ref 0.0–5.0)
Cholesterol, Total: 167 mg/dL (ref 100–199)
HDL: 38 mg/dL — ABNORMAL LOW (ref >39)
LDL Chol Calc (NIH): 110 mg/dL — ABNORMAL HIGH (ref 0–99)
Triglycerides: 103 mg/dL (ref 0–149)
VLDL Cholesterol Cal: 19 mg/dL (ref 5–40)

## 2022-08-20 LAB — MICROALBUMIN / CREATININE URINE RATIO
Creatinine, Urine: 314.2 mg/dL
Microalb/Creat Ratio: 17 mg/g creat (ref 0–29)
Microalbumin, Urine: 53.3 ug/mL

## 2022-08-20 LAB — CBC WITH DIFFERENTIAL/PLATELET
Basophils Absolute: 0.1 10*3/uL (ref 0.0–0.2)
Basos: 1 %
EOS (ABSOLUTE): 0.2 10*3/uL (ref 0.0–0.4)
Eos: 3 %
Hematocrit: 33.4 % — ABNORMAL LOW (ref 37.5–51.0)
Hemoglobin: 11.4 g/dL — ABNORMAL LOW (ref 13.0–17.7)
Immature Grans (Abs): 0 10*3/uL (ref 0.0–0.1)
Immature Granulocytes: 0 %
Lymphocytes Absolute: 2.7 10*3/uL (ref 0.7–3.1)
Lymphs: 37 %
MCH: 31.7 pg (ref 26.6–33.0)
MCHC: 34.1 g/dL (ref 31.5–35.7)
MCV: 93 fL (ref 79–97)
Monocytes Absolute: 0.5 10*3/uL (ref 0.1–0.9)
Monocytes: 7 %
Neutrophils Absolute: 3.9 10*3/uL (ref 1.4–7.0)
Neutrophils: 52 %
Platelets: 235 10*3/uL (ref 150–450)
RBC: 3.6 x10E6/uL — ABNORMAL LOW (ref 4.14–5.80)
RDW: 13.1 % (ref 11.6–15.4)
WBC: 7.4 10*3/uL (ref 3.4–10.8)

## 2022-08-20 LAB — VITAMIN D 25 HYDROXY (VIT D DEFICIENCY, FRACTURES): Vit D, 25-Hydroxy: 39.2 ng/mL (ref 30.0–100.0)

## 2022-08-21 DIAGNOSIS — I700 Atherosclerosis of aorta: Secondary | ICD-10-CM | POA: Diagnosis not present

## 2022-08-21 DIAGNOSIS — D630 Anemia in neoplastic disease: Secondary | ICD-10-CM | POA: Diagnosis not present

## 2022-08-21 DIAGNOSIS — C775 Secondary and unspecified malignant neoplasm of intrapelvic lymph nodes: Secondary | ICD-10-CM | POA: Diagnosis not present

## 2022-08-21 DIAGNOSIS — E1151 Type 2 diabetes mellitus with diabetic peripheral angiopathy without gangrene: Secondary | ICD-10-CM | POA: Diagnosis not present

## 2022-08-21 DIAGNOSIS — C7951 Secondary malignant neoplasm of bone: Secondary | ICD-10-CM | POA: Diagnosis not present

## 2022-08-22 ENCOUNTER — Ambulatory Visit: Payer: Self-pay | Admitting: *Deleted

## 2022-08-22 DIAGNOSIS — D630 Anemia in neoplastic disease: Secondary | ICD-10-CM | POA: Diagnosis not present

## 2022-08-22 DIAGNOSIS — C775 Secondary and unspecified malignant neoplasm of intrapelvic lymph nodes: Secondary | ICD-10-CM | POA: Diagnosis not present

## 2022-08-22 DIAGNOSIS — I700 Atherosclerosis of aorta: Secondary | ICD-10-CM | POA: Diagnosis not present

## 2022-08-22 DIAGNOSIS — C7951 Secondary malignant neoplasm of bone: Secondary | ICD-10-CM | POA: Diagnosis not present

## 2022-08-22 DIAGNOSIS — E1151 Type 2 diabetes mellitus with diabetic peripheral angiopathy without gangrene: Secondary | ICD-10-CM | POA: Diagnosis not present

## 2022-08-22 NOTE — Patient Instructions (Addendum)
Visit Information  Thank you for taking time to visit with me today. Please don't hesitate to contact me if I can be of assistance to you.   Following are the goals we discussed today:   Goals Addressed               This Visit's Progress     Patient Stated     Manage Diabetes [facility]) (pt-stated)   On track     Care Coordination Interventions: Discussed plans with patient for ongoing care management follow up and provided patient with direct contact information for care management team Reviewed scheduled/upcoming provider appointments including: Dr Livia Snellen, pcp 11/17/22  Review of patient status, including review of consultants reports, relevant laboratory and other test results, and medications completed      COMPLETED: Resource for housing improvement [facility]) (pt-stated)        Care Coordination Interventions: Discussed plans with patient for ongoing care management follow up and provided patient with direct contact information for care management team Interventions Today    Flowsheet Row Most Recent Value  Chronic Disease   Chronic disease during today's visit Diabetes, Other     Patient and RN CM agreed on closure of this goal         Our next appointment is by telephone on 11/20/22 at 1115  Please call the care guide team at [phone] if you need to cancel or reschedule your appointment.   If you are experiencing a Mental Health or [place] or need someone to talk to, please call the Suicide and Crisis Lifeline: 988 call the [place] National Suicide Prevention Lifeline: 505-372-7061 or TTY: 234 486 8345 TTY (760)081-3399) to talk to a trained counselor call 1-800-273-TALK (toll free, 24 hour hotline) call the [facility]: [phone] call 911   The patient verbalized understanding of instructions, educational materials, and care plan provided today and DECLINED offer to receive copy of patient instructions, educational materials, and  care plan.   The patient has been provided with contact information for the care management team and has been advised to call with any health related questions or concerns.   Marquis Down L. Lavina Hamman, RN, BSN, [place] Coordinator Office number [phone]

## 2022-08-22 NOTE — Patient Outreach (Signed)
  Care Coordination   Follow Up Visit Note   08/22/2022 Name: SIMONE BURRINGTON MRN: [id number] DOB: 1946/04/28  HERMILO KYLE is a 77 y.o. year old male who sees [name], MD for primary care. I spoke with  [name] by phone today.  What matters to the patients health and wellness today?  "I don't     Goals Addressed               This Visit's Progress     Patient Stated     Manage Diabetes [facility]) (pt-stated)   On track     Care Coordination Interventions: Discussed plans with patient for ongoing care management follow up and provided patient with direct contact information for care management team Reviewed scheduled/upcoming provider appointments including: Dr Livia Snellen, pcp 11/17/22  Review of patient status, including review of consultants reports, relevant laboratory and other test results, and medications completed      COMPLETED: Resource for housing improvement [facility]) (pt-stated)        Care Coordination Interventions: Discussed plans with patient for ongoing care management follow up and provided patient with direct contact information for care management team Interventions Today    Flowsheet Row Most Recent Value  Chronic Disease   Chronic disease during today's visit Diabetes, Other     Patient and RN CM agreed on closure of this goal         SDOH assessments and interventions completed:  No     Care Coordination Interventions:  Yes, provided   Follow up plan: Follow up call scheduled for 11/20/22    Encounter Outcome:  Pt. Visit Completed   Edem Tiegs L. Lavina Hamman, RN, BSN, [place] Coordinator Office number [phone]

## 2022-08-25 DIAGNOSIS — I700 Atherosclerosis of aorta: Secondary | ICD-10-CM | POA: Diagnosis not present

## 2022-08-25 DIAGNOSIS — C775 Secondary and unspecified malignant neoplasm of intrapelvic lymph nodes: Secondary | ICD-10-CM | POA: Diagnosis not present

## 2022-08-25 DIAGNOSIS — E1151 Type 2 diabetes mellitus with diabetic peripheral angiopathy without gangrene: Secondary | ICD-10-CM | POA: Diagnosis not present

## 2022-08-25 DIAGNOSIS — C7951 Secondary malignant neoplasm of bone: Secondary | ICD-10-CM | POA: Diagnosis not present

## 2022-08-25 DIAGNOSIS — D630 Anemia in neoplastic disease: Secondary | ICD-10-CM | POA: Diagnosis not present

## 2022-08-27 ENCOUNTER — Inpatient Hospital Stay: Payer: Medicare Other

## 2022-08-27 ENCOUNTER — Telehealth: Payer: Self-pay | Admitting: *Deleted

## 2022-08-27 ENCOUNTER — Inpatient Hospital Stay: Payer: Medicare Other | Admitting: Hematology

## 2022-08-27 DIAGNOSIS — C61 Malignant neoplasm of prostate: Secondary | ICD-10-CM

## 2022-08-27 DIAGNOSIS — Z95828 Presence of other vascular implants and grafts: Secondary | ICD-10-CM

## 2022-08-27 NOTE — Telephone Encounter (Signed)
Patient called and has fallen and cannot make it to appointments today.  Said he did not feel like he needed to go to the ER.  Denies injury.

## 2022-08-28 ENCOUNTER — Inpatient Hospital Stay: Payer: Medicare Other | Attending: Hematology | Admitting: Hematology

## 2022-08-28 DIAGNOSIS — C61 Malignant neoplasm of prostate: Secondary | ICD-10-CM | POA: Diagnosis not present

## 2022-08-28 NOTE — Progress Notes (Signed)
Virtual Visit via Telephone Note  I connected with Andrew [name] on '@TODAY'$ @ at  3:15 PM EST by telephone and verified that I am speaking with the correct person using two identifiers.  Location: Patient: Andrew [name] Provider: Derek Jack, MD    I discussed the limitations, risks, security and privacy concerns of performing an evaluation and management service by telephone and the availability of in person appointments. I also discussed with the patient that there may be a patient responsible charge related to this service. The patient expressed understanding and agreed to proceed.   History of Present Illness: He is seen in our clinic for metastatic CRPC to the lymph nodes.  He is currently on docetaxel chemotherapy, last cycle 8 on 07/02/2022.   Observations/Objective: He continues to feel weak.  He reportedly fell yesterday and could not come to our clinic.  His appetite continues to be low.   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  CTAP on 08/26/2017 shows enlarged left pelvic sidewall lymph node 2.2 cm. - Lupron initiated on 08/10/2019. - CTAP on 03/09/2020 with left internal/external iliac lymph node measuring 2.1 x 2.8 cm (2.1 x 2.2 cm), craniocaudal 4.1 cm, previously 3.4 cm. - Enzalutamide to 160 mg daily started on 04/17/2020.  Discontinued end of October 2022. - PSA on 02/20/2021 (5.2), 07/11/2020 (2.0), 02/08/2020 (3.1) - CTAP on 03/07/2021 with left iliac bifurcation lymph node measuring 3.5 x 2.6 cm (previously 2.8 x 2.0 cm) - Bone scan on 03/07/2021 negative. - Left pelvic lymph node biopsy on 04/08/2021, metastatic prostate cancer. - We have reviewed PSMA PET scan results which showed  enlarged left iliac lymph node 33 mm with SUV 285.  1 mm lymph node and 4 mm node between IVC and aorta.  Activity in the right acetabulum, left iliac bone, left transverse process of T11 vertebral body, spine of the right scapula. - Guardant 360 did not show any targetable mutations.  Did not show MSI high. - NGS testing could not be done because of less than 100 tumor cells. - Abiraterone and prednisone from 07/15/2021 through 10/01/2021 with progression. - Germline mutation Invitae testing was negative. - 8 cycles of docetaxel from 10/21/2021 through 07/02/2022     PLAN:  Metastatic castration refractory prostate cancer to the left iliac lymph node: - He is tolerating chemotherapy very poorly.  He is continuing to lose weight and becoming more weaker. - Last PSA was 9.54. - Reviewed labs from 08/19/2022 which showed albumin is 3.5 and CBC with normal white count and platelet count.  Hemoglobin improved to 11.4. - Due to his poor tolerance, I have recommended best supportive care in the form of hospice.  He is agreeable.  Will make referral to [facility].  2.  Weight loss: - Continue Megace twice daily.  3.  Normocytic anemia: - He has completed Venofer in the past.  Latest hemoglobin improved to 11.4.   Follow Up Instructions: RTC as needed.   I discussed the assessment and treatment plan with the patient. The patient was provided an opportunity to ask questions and all were answered. The patient agreed with the plan and demonstrated an understanding of the instructions.   The patient was advised to call back  or seek an in-person evaluation if the symptoms worsen or if the condition fails to improve as anticipated.  I provided 23 minutes of non-face-to-face time during this encounter.   Derek Jack, MD

## 2022-08-28 NOTE — Progress Notes (Deleted)
Virtual Visit via Telephone Note  I connected with Andrew [name] on '@TODAY'$ @ at  3:15 PM EST by telephone and verified that I am speaking with the correct person using two identifiers.  Location: Patient: Andrew [name] Provider: Derek Jack, MD    I discussed the limitations, risks, security and privacy concerns of performing an evaluation and management service by telephone and the availability of in person appointments. I also discussed with the patient that there may be a patient responsible charge related to this service. The patient expressed understanding and agreed to proceed.   History of Present Illness: He is seen in our clinic for metastatic CRPC to the lymph nodes.  He is currently on docetaxel chemotherapy, last cycle 8 on 07/02/2022.   Observations/Objective: He continues to feel weak.  He reportedly fell yesterday and could not come to our clinic.  His appetite continues to be low.   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  CTAP on 08/26/2017 shows enlarged left pelvic sidewall lymph node 2.2 cm. - Lupron initiated on 08/10/2019. - CTAP on 03/09/2020 with left internal/external iliac lymph node measuring 2.1 x 2.8 cm (2.1 x 2.2 cm), craniocaudal 4.1 cm, previously 3.4 cm. - Enzalutamide to 160 mg daily started on 04/17/2020.  Discontinued end of October 2022. - PSA on 02/20/2021 (5.2), 07/11/2020 (2.0), 02/08/2020 (3.1) - CTAP on 03/07/2021 with left iliac bifurcation lymph node measuring 3.5 x 2.6 cm (previously 2.8 x 2.0 cm) - Bone scan on 03/07/2021 negative. - Left pelvic lymph node biopsy on 04/08/2021, metastatic prostate cancer. - We have reviewed PSMA PET scan results which showed  enlarged left iliac lymph node 33 mm with SUV 285.  1 mm lymph node and 4 mm node between IVC and aorta.  Activity in the right acetabulum, left iliac bone, left transverse process of T11 vertebral body, spine of the right scapula. - Guardant 360 did not show any targetable mutations.  Did not show MSI high. - NGS testing could not be done because of less than 100 tumor cells. - Abiraterone and prednisone from 07/15/2021 through 10/01/2021 with progression. - Germline mutation Invitae testing was negative. - 8 cycles of docetaxel from 10/21/2021 through 07/02/2022     PLAN:  Metastatic castration refractory prostate cancer to the left iliac lymph node: - He is tolerating chemotherapy very poorly.  He is continuing to lose weight and becoming more weaker. - Last PSA was 9.54. - Reviewed labs from 08/19/2022 which showed albumin is 3.5 and CBC with normal white count and platelet count.  Hemoglobin improved to 11.4. - Due to his poor tolerance, I have recommended best supportive care in the form of hospice.  He is agreeable.  Will make referral to [facility].  2.  Weight loss: - Continue Megace twice daily.  3.  Normocytic anemia: - He has completed Venofer in the past.  Latest hemoglobin improved to 11.4.   Follow Up Instructions: RTC as needed.   I discussed the assessment and treatment plan with the patient. The patient was provided an opportunity to ask questions and all were answered. The patient agreed with the plan and demonstrated an understanding of the instructions.   The patient was advised to call back  or seek an in-person evaluation if the symptoms worsen or if the condition fails to improve as anticipated.  I provided 23 minutes of non-face-to-face time during this encounter.   Derek Jack, MD

## 2022-08-29 ENCOUNTER — Ambulatory Visit: Payer: Medicare Other

## 2022-08-29 DIAGNOSIS — C775 Secondary and unspecified malignant neoplasm of intrapelvic lymph nodes: Secondary | ICD-10-CM | POA: Diagnosis not present

## 2022-08-29 DIAGNOSIS — C7951 Secondary malignant neoplasm of bone: Secondary | ICD-10-CM | POA: Diagnosis not present

## 2022-08-29 DIAGNOSIS — D630 Anemia in neoplastic disease: Secondary | ICD-10-CM | POA: Diagnosis not present

## 2022-08-29 DIAGNOSIS — E1151 Type 2 diabetes mellitus with diabetic peripheral angiopathy without gangrene: Secondary | ICD-10-CM | POA: Diagnosis not present

## 2022-08-29 DIAGNOSIS — I700 Atherosclerosis of aorta: Secondary | ICD-10-CM | POA: Diagnosis not present

## 2022-09-03 DIAGNOSIS — I700 Atherosclerosis of aorta: Secondary | ICD-10-CM | POA: Diagnosis not present

## 2022-09-03 DIAGNOSIS — C7951 Secondary malignant neoplasm of bone: Secondary | ICD-10-CM | POA: Diagnosis not present

## 2022-09-03 DIAGNOSIS — C775 Secondary and unspecified malignant neoplasm of intrapelvic lymph nodes: Secondary | ICD-10-CM | POA: Diagnosis not present

## 2022-09-03 DIAGNOSIS — E1151 Type 2 diabetes mellitus with diabetic peripheral angiopathy without gangrene: Secondary | ICD-10-CM | POA: Diagnosis not present

## 2022-09-03 DIAGNOSIS — D630 Anemia in neoplastic disease: Secondary | ICD-10-CM | POA: Diagnosis not present

## 2022-09-24 ENCOUNTER — Ambulatory Visit: Payer: Medicare Other | Admitting: Hematology

## 2022-09-24 ENCOUNTER — Ambulatory Visit: Payer: Medicare Other

## 2022-09-24 ENCOUNTER — Other Ambulatory Visit: Payer: Medicare Other

## 2022-09-26 ENCOUNTER — Ambulatory Visit: Payer: Medicare Other

## 2022-10-17 DIAGNOSIS — R5381 Other malaise: Secondary | ICD-10-CM | POA: Diagnosis not present

## 2022-10-17 DIAGNOSIS — R279 Unspecified lack of coordination: Secondary | ICD-10-CM | POA: Diagnosis not present

## 2022-10-17 DIAGNOSIS — Z7401 Bed confinement status: Secondary | ICD-10-CM | POA: Diagnosis not present

## 2022-10-22 ENCOUNTER — Ambulatory Visit: Payer: Medicare Other

## 2022-10-22 ENCOUNTER — Other Ambulatory Visit: Payer: Medicare Other

## 2022-10-22 ENCOUNTER — Ambulatory Visit: Payer: Medicare Other | Admitting: Hematology

## 2022-10-24 ENCOUNTER — Ambulatory Visit: Payer: Medicare Other

## 2022-11-17 ENCOUNTER — Ambulatory Visit: Payer: Medicaid Other

## 2022-11-20 ENCOUNTER — Ambulatory Visit: Payer: Self-pay | Admitting: *Deleted

## 2022-11-20 NOTE — Patient Outreach (Signed)
  Care Coordination   11/20/2022 Name: Andrew [name] MRN: [id number] DOB: 07-24-1945   Care Coordination Outreach Attempts:  An unsuccessful telephone outreach was attempted today to offer the patient information about available care coordination services.  Follow Up Plan:  Additional outreach attempts will be made to offer the patient care coordination information and services.   Encounter Outcome:  No Answer   Care Coordination Interventions:  No, not indicated     Zakariah Dejarnette L. Noelle Penner, RN, BSN, CCM [facility] Care Management Community Coordinator Office number [phone]

## 2023-01-25 ENCOUNTER — Other Ambulatory Visit: Payer: Self-pay

## 2023-01-25 ENCOUNTER — Encounter (HOSPITAL_COMMUNITY): Payer: Self-pay | Admitting: Emergency Medicine

## 2023-01-25 ENCOUNTER — Emergency Department (HOSPITAL_COMMUNITY)

## 2023-01-25 ENCOUNTER — Inpatient Hospital Stay (HOSPITAL_COMMUNITY)
Admission: EM | Admit: 2023-01-25 | Discharge: 2023-01-28 | DRG: 871 | Disposition: A | Discharge status: Hospice | Source: Hospice | Attending: Family Medicine | Admitting: Family Medicine

## 2023-01-25 DIAGNOSIS — Z825 Family history of asthma and other chronic lower respiratory diseases: Secondary | ICD-10-CM

## 2023-01-25 DIAGNOSIS — E785 Hyperlipidemia, unspecified: Secondary | ICD-10-CM | POA: Diagnosis present

## 2023-01-25 DIAGNOSIS — Z8249 Family history of ischemic heart disease and other diseases of the circulatory system: Secondary | ICD-10-CM

## 2023-01-25 DIAGNOSIS — N138 Other obstructive and reflux uropathy: Secondary | ICD-10-CM | POA: Diagnosis present

## 2023-01-25 DIAGNOSIS — D689 Coagulation defect, unspecified: Secondary | ICD-10-CM | POA: Diagnosis present

## 2023-01-25 DIAGNOSIS — E1169 Type 2 diabetes mellitus with other specified complication: Secondary | ICD-10-CM | POA: Diagnosis present

## 2023-01-25 DIAGNOSIS — C61 Malignant neoplasm of prostate: Secondary | ICD-10-CM | POA: Diagnosis present

## 2023-01-25 DIAGNOSIS — R627 Adult failure to thrive: Secondary | ICD-10-CM | POA: Diagnosis present

## 2023-01-25 DIAGNOSIS — N390 Urinary tract infection, site not specified: Secondary | ICD-10-CM | POA: Diagnosis present

## 2023-01-25 DIAGNOSIS — L03317 Cellulitis of buttock: Secondary | ICD-10-CM | POA: Diagnosis present

## 2023-01-25 DIAGNOSIS — T83511A Infection and inflammatory reaction due to indwelling urethral catheter, initial encounter: Secondary | ICD-10-CM | POA: Diagnosis present

## 2023-01-25 DIAGNOSIS — R6520 Severe sepsis without septic shock: Secondary | ICD-10-CM | POA: Diagnosis present

## 2023-01-25 DIAGNOSIS — Z515 Encounter for palliative care: Secondary | ICD-10-CM | POA: Diagnosis not present

## 2023-01-25 DIAGNOSIS — L89312 Pressure ulcer of right buttock, stage 2: Secondary | ICD-10-CM | POA: Diagnosis present

## 2023-01-25 DIAGNOSIS — Z7982 Long term (current) use of aspirin: Secondary | ICD-10-CM

## 2023-01-25 DIAGNOSIS — I499 Cardiac arrhythmia, unspecified: Secondary | ICD-10-CM | POA: Diagnosis not present

## 2023-01-25 DIAGNOSIS — E43 Unspecified severe protein-calorie malnutrition: Secondary | ICD-10-CM | POA: Diagnosis not present

## 2023-01-25 DIAGNOSIS — F17210 Nicotine dependence, cigarettes, uncomplicated: Secondary | ICD-10-CM | POA: Diagnosis present

## 2023-01-25 DIAGNOSIS — N179 Acute kidney failure, unspecified: Secondary | ICD-10-CM | POA: Diagnosis present

## 2023-01-25 DIAGNOSIS — Z79899 Other long term (current) drug therapy: Secondary | ICD-10-CM

## 2023-01-25 DIAGNOSIS — A401 Sepsis due to streptococcus, group B: Secondary | ICD-10-CM | POA: Diagnosis present

## 2023-01-25 DIAGNOSIS — E46 Unspecified protein-calorie malnutrition: Secondary | ICD-10-CM | POA: Insufficient documentation

## 2023-01-25 DIAGNOSIS — Z9221 Personal history of antineoplastic chemotherapy: Secondary | ICD-10-CM

## 2023-01-25 DIAGNOSIS — Z960 Presence of urogenital implants: Secondary | ICD-10-CM | POA: Diagnosis present

## 2023-01-25 DIAGNOSIS — I10 Essential (primary) hypertension: Secondary | ICD-10-CM | POA: Diagnosis present

## 2023-01-25 DIAGNOSIS — L89322 Pressure ulcer of left buttock, stage 2: Secondary | ICD-10-CM | POA: Diagnosis present

## 2023-01-25 DIAGNOSIS — R1310 Dysphagia, unspecified: Secondary | ICD-10-CM | POA: Diagnosis present

## 2023-01-25 DIAGNOSIS — Z841 Family history of disorders of kidney and ureter: Secondary | ICD-10-CM

## 2023-01-25 DIAGNOSIS — Z66 Do not resuscitate: Secondary | ICD-10-CM | POA: Diagnosis present

## 2023-01-25 DIAGNOSIS — R6521 Severe sepsis with septic shock: Secondary | ICD-10-CM | POA: Diagnosis present

## 2023-01-25 DIAGNOSIS — C799 Secondary malignant neoplasm of unspecified site: Secondary | ICD-10-CM | POA: Diagnosis present

## 2023-01-25 DIAGNOSIS — I700 Atherosclerosis of aorta: Secondary | ICD-10-CM | POA: Diagnosis present

## 2023-01-25 DIAGNOSIS — E119 Type 2 diabetes mellitus without complications: Secondary | ICD-10-CM

## 2023-01-25 DIAGNOSIS — L03119 Cellulitis of unspecified part of limb: Secondary | ICD-10-CM | POA: Diagnosis not present

## 2023-01-25 DIAGNOSIS — B9620 Unspecified Escherichia coli [E. coli] as the cause of diseases classified elsewhere: Secondary | ICD-10-CM | POA: Diagnosis present

## 2023-01-25 DIAGNOSIS — R531 Weakness: Secondary | ICD-10-CM | POA: Diagnosis not present

## 2023-01-25 DIAGNOSIS — Z801 Family history of malignant neoplasm of trachea, bronchus and lung: Secondary | ICD-10-CM

## 2023-01-25 DIAGNOSIS — Z6826 Body mass index (BMI) 26.0-26.9, adult: Secondary | ICD-10-CM

## 2023-01-25 DIAGNOSIS — N401 Enlarged prostate with lower urinary tract symptoms: Secondary | ICD-10-CM | POA: Diagnosis present

## 2023-01-25 DIAGNOSIS — Z7984 Long term (current) use of oral hypoglycemic drugs: Secondary | ICD-10-CM

## 2023-01-25 DIAGNOSIS — A419 Sepsis, unspecified organism: Principal | ICD-10-CM

## 2023-01-25 DIAGNOSIS — Z743 Need for continuous supervision: Secondary | ICD-10-CM | POA: Diagnosis not present

## 2023-01-25 DIAGNOSIS — B961 Klebsiella pneumoniae [K. pneumoniae] as the cause of diseases classified elsewhere: Secondary | ICD-10-CM | POA: Diagnosis present

## 2023-01-25 DIAGNOSIS — Z95828 Presence of other vascular implants and grafts: Secondary | ICD-10-CM

## 2023-01-25 DIAGNOSIS — Z83719 Family history of colon polyps, unspecified: Secondary | ICD-10-CM

## 2023-01-25 DIAGNOSIS — Z833 Family history of diabetes mellitus: Secondary | ICD-10-CM

## 2023-01-25 DIAGNOSIS — Z923 Personal history of irradiation: Secondary | ICD-10-CM

## 2023-01-25 DIAGNOSIS — Z8546 Personal history of malignant neoplasm of prostate: Secondary | ICD-10-CM

## 2023-01-25 DIAGNOSIS — R54 Age-related physical debility: Secondary | ICD-10-CM | POA: Diagnosis present

## 2023-01-25 DIAGNOSIS — Z1152 Encounter for screening for COVID-19: Secondary | ICD-10-CM

## 2023-01-25 LAB — CULTURE, BLOOD (ROUTINE X 2): Special Requests: ADEQUATE

## 2023-01-25 LAB — COMPREHENSIVE METABOLIC PANEL
ALT: 20 U/L (ref 0–44)
AST: 24 U/L (ref 15–41)
Albumin: 2.1 g/dL — ABNORMAL LOW (ref 3.5–5.0)
Alkaline Phosphatase: 78 U/L (ref 38–126)
Anion gap: 15 (ref 5–15)
BUN: 41 mg/dL — ABNORMAL HIGH (ref 8–23)
CO2: 17 mmol/L — ABNORMAL LOW (ref 22–32)
Calcium: 8.2 mg/dL — ABNORMAL LOW (ref 8.9–10.3)
Chloride: 102 mmol/L (ref 98–111)
Creatinine, Ser: 2.15 mg/dL — ABNORMAL HIGH (ref 0.61–1.24)
GFR, Estimated: 31 mL/min — ABNORMAL LOW (ref >60)
Glucose, Bld: 129 mg/dL — ABNORMAL HIGH (ref 70–99)
Potassium: 3.8 mmol/L (ref 3.5–5.1)
Sodium: 134 mmol/L — ABNORMAL LOW (ref 135–145)
Total Bilirubin: 1 mg/dL (ref 0.3–1.2)
Total Protein: 6.5 g/dL (ref 6.5–8.1)

## 2023-01-25 LAB — CREATININE, SERUM
Creatinine, Ser: 2.01 mg/dL — ABNORMAL HIGH (ref 0.61–1.24)
GFR, Estimated: 34 mL/min — ABNORMAL LOW (ref >60)

## 2023-01-25 LAB — CBC WITH DIFFERENTIAL/PLATELET
Abs Immature Granulocytes: 0.16 10*3/uL — ABNORMAL HIGH (ref 0.00–0.07)
Basophils Absolute: 0 10*3/uL (ref 0.0–0.1)
Basophils Relative: 0 %
Eosinophils Absolute: 0 10*3/uL (ref 0.0–0.5)
Eosinophils Relative: 0 %
HCT: 30.8 % — ABNORMAL LOW (ref 39.0–52.0)
Hemoglobin: 10.1 g/dL — ABNORMAL LOW (ref 13.0–17.0)
Immature Granulocytes: 1 %
Lymphocytes Relative: 10 %
Lymphs Abs: 1.5 10*3/uL (ref 0.7–4.0)
MCH: 30.8 pg (ref 26.0–34.0)
MCHC: 32.8 g/dL (ref 30.0–36.0)
MCV: 93.9 fL (ref 80.0–100.0)
Monocytes Absolute: 0.7 10*3/uL (ref 0.1–1.0)
Monocytes Relative: 5 %
Neutro Abs: 13.1 10*3/uL — ABNORMAL HIGH (ref 1.7–7.7)
Neutrophils Relative %: 84 %
Platelets: 244 10*3/uL (ref 150–400)
RBC: 3.28 MIL/uL — ABNORMAL LOW (ref 4.22–5.81)
RDW: 15.1 % (ref 11.5–15.5)
WBC: 15.5 10*3/uL — ABNORMAL HIGH (ref 4.0–10.5)
nRBC: 0.5 % — ABNORMAL HIGH (ref 0.0–0.2)

## 2023-01-25 LAB — RESP PANEL BY RT-PCR (RSV, FLU A&B, COVID)  RVPGX2
Influenza A by PCR: NEGATIVE
Influenza B by PCR: NEGATIVE
Resp Syncytial Virus by PCR: NEGATIVE
SARS Coronavirus 2 by RT PCR: NEGATIVE

## 2023-01-25 LAB — URINALYSIS, W/ REFLEX TO CULTURE (INFECTION SUSPECTED)
Glucose, UA: NEGATIVE mg/dL
Ketones, ur: NEGATIVE mg/dL
Nitrite: POSITIVE — AB
Protein, ur: >300 mg/dL — AB
Specific Gravity, Urine: >1.03 — ABNORMAL HIGH (ref 1.005–1.030)
WBC, UA: >50 WBC/hpf (ref 0–5)
pH: 6.5 (ref 5.0–8.0)

## 2023-01-25 LAB — CBC
HCT: 30.3 % — ABNORMAL LOW (ref 39.0–52.0)
Hemoglobin: 9.7 g/dL — ABNORMAL LOW (ref 13.0–17.0)
MCH: 31 pg (ref 26.0–34.0)
MCHC: 32 g/dL (ref 30.0–36.0)
MCV: 96.8 fL (ref 80.0–100.0)
Platelets: 203 10*3/uL (ref 150–400)
RBC: 3.13 MIL/uL — ABNORMAL LOW (ref 4.22–5.81)
RDW: 15.3 % (ref 11.5–15.5)
WBC: 13.1 10*3/uL — ABNORMAL HIGH (ref 4.0–10.5)
nRBC: 0.5 % — ABNORMAL HIGH (ref 0.0–0.2)

## 2023-01-25 LAB — LACTIC ACID, PLASMA
Lactic Acid, Venous: 3.8 mmol/L (ref 0.5–1.9)
Lactic Acid, Venous: 7.7 mmol/L (ref 0.5–1.9)
Lactic Acid, Venous: 8.7 mmol/L (ref 0.5–1.9)
Lactic Acid, Venous: 8 mmol/L (ref 0.5–1.9)

## 2023-01-25 LAB — PROTIME-INR
INR: 1.2 (ref 0.8–1.2)
Prothrombin Time: 15.6 seconds — ABNORMAL HIGH (ref 11.4–15.2)

## 2023-01-25 LAB — APTT: aPTT: 37 seconds — ABNORMAL HIGH (ref 24–36)

## 2023-01-25 MED ORDER — ACETAMINOPHEN 325 MG PO TABS: 650.0000 mg | ORAL_TABLET | Freq: Four times a day (QID) | ORAL | Status: DC | PRN

## 2023-01-25 MED ORDER — ONDANSETRON HCL 4 MG/2ML IJ SOLN: 4.0000 mg | Freq: Four times a day (QID) | INTRAMUSCULAR | Status: DC | PRN

## 2023-01-25 MED ORDER — ENOXAPARIN SODIUM 30 MG/0.3ML IJ SOSY
30.0000 mg | PREFILLED_SYRINGE | INTRAMUSCULAR | Status: DC
  Administered 2023-01-26: 30 mg via SUBCUTANEOUS
  Filled 2023-01-25 (×2): qty 0.3

## 2023-01-25 MED ORDER — BISACODYL 10 MG RE SUPP: 10.0000 mg | Freq: Every day | RECTAL | Status: DC | PRN

## 2023-01-25 MED ORDER — LACTATED RINGERS IV BOLUS (SEPSIS)
1000.0000 mL | Freq: Once | INTRAVENOUS | Status: AC
  Administered 2023-01-25: 1000 mL via INTRAVENOUS

## 2023-01-25 MED ORDER — SODIUM CHLORIDE 0.9 % IV SOLN
2.0000 g | Freq: Once | INTRAVENOUS | Status: AC
  Administered 2023-01-25: 2 g via INTRAVENOUS
  Filled 2023-01-25: qty 12.5

## 2023-01-25 MED ORDER — LACTATED RINGERS IV SOLN: INTRAVENOUS | Status: DC

## 2023-01-25 MED ORDER — FLEET ENEMA 7-19 GM/118ML RE ENEM: 1.0000 | ENEMA | Freq: Once | RECTAL | Status: DC | PRN

## 2023-01-25 MED ORDER — POLYETHYLENE GLYCOL 3350 17 G PO PACK: 17.0000 g | PACK | Freq: Every day | ORAL | Status: DC | PRN

## 2023-01-25 MED ORDER — ACETAMINOPHEN 650 MG RE SUPP: 650.0000 mg | Freq: Four times a day (QID) | RECTAL | Status: DC | PRN

## 2023-01-25 MED ORDER — SODIUM CHLORIDE 0.9 % IV SOLN
2.0000 g | INTRAVENOUS | Status: DC
  Administered 2023-01-26 – 2023-01-27 (×2): 2 g via INTRAVENOUS
  Filled 2023-01-25 (×2): qty 12.5

## 2023-01-25 MED ORDER — ENSURE ENLIVE PO LIQD
237.0000 mL | Freq: Two times a day (BID) | ORAL | Status: DC
  Administered 2023-01-26 – 2023-01-28 (×5): 237 mL via ORAL

## 2023-01-25 MED ORDER — DOCUSATE SODIUM 100 MG PO CAPS
100.0000 mg | ORAL_CAPSULE | Freq: Two times a day (BID) | ORAL | Status: DC
  Administered 2023-01-25 – 2023-01-28 (×6): 100 mg via ORAL
  Filled 2023-01-25 (×6): qty 1

## 2023-01-25 MED ORDER — SODIUM CHLORIDE 0.9% FLUSH
3.0000 mL | Freq: Two times a day (BID) | INTRAVENOUS | Status: DC
  Administered 2023-01-25 – 2023-01-28 (×4): 3 mL via INTRAVENOUS

## 2023-01-25 MED ORDER — ONDANSETRON HCL 4 MG/2ML IJ SOLN
4.0000 mg | Freq: Once | INTRAMUSCULAR | Status: AC
  Administered 2023-01-25: 4 mg via INTRAVENOUS
  Filled 2023-01-25: qty 2

## 2023-01-25 MED ORDER — MORPHINE SULFATE (PF) 2 MG/ML IV SOLN
2.0000 mg | INTRAVENOUS | Status: DC | PRN
  Administered 2023-01-25: 2 mg via INTRAVENOUS
  Filled 2023-01-25: qty 1

## 2023-01-25 MED ORDER — ONDANSETRON HCL 4 MG PO TABS
4.0000 mg | ORAL_TABLET | Freq: Four times a day (QID) | ORAL | Status: DC | PRN
  Administered 2023-01-27: 4 mg via ORAL
  Filled 2023-01-25: qty 1

## 2023-01-25 MED ORDER — LACTATED RINGERS IV BOLUS (SEPSIS)
500.0000 mL | Freq: Once | INTRAVENOUS | Status: AC
  Administered 2023-01-25: 500 mL via INTRAVENOUS

## 2023-01-25 NOTE — ED Triage Notes (Signed)
Pt is a hospice pt with poor po intake and bed sores. Hospice sent pt here for antibiotics and fluids.

## 2023-01-25 NOTE — ED Provider Notes (Signed)
[facility] Provider Note   CSN: [id number] Arrival date & time: 01/25/23  1532     History  Chief Complaint  Patient presents with   Weakness    Andrew [name] is a 77 y.o. male.   Weakness  Patient is a 77 year old male, he is currently on hospice, he has end-stage prostate cancer with an indwelling Foley catheter and history of diabetes.  He presents from home by EMS transport after they called out from the hospice service that said that he had an infection and needed antibiotics and fluids.  The patient is not able to give me much in the way of information, he appears withdrawn, his tachycardic, warm to the touch, no reports of vomiting,    Home Medications Prior to Admission medications   Medication Sig Start Date End Date Taking? Authorizing Provider  acetaminophen (TYLENOL) 650 MG CR tablet Take 1,300 mg by mouth every 8 (eight) hours as needed for pain.    [provider]  alfuzosin (UROXATRAL) 10 MG 24 hr tablet Take 1 tablet (10 mg total) by mouth at bedtime. 08/19/22   [name], MD  aspirin 81 MG tablet Take 1 tablet (81 mg total) by mouth daily with breakfast. 10/18/21   Pappayliou, Santina Evans A, DO  atorvastatin (LIPITOR) 40 MG tablet Take 1 tablet (40 mg total) by mouth at bedtime. 08/19/22   [name], MD  cholecalciferol (VITAMIN D) 1000 UNITS tablet Take 1,000 Units by mouth daily.     [provider]  DOCEtaxel (TAXOTERE IV) Inject into the vein every 21 ( twenty-one) days. 10/14/21   [provider]  docusate sodium (COLACE) 100 MG capsule Take 1 capsule (100 mg total) by mouth 2 (two) times daily. 08/19/22 08/19/23  [name], MD  Empagliflozin-linaGLIPtin (GLYXAMBI) 25-5 MG TABS Take 1 tablet by mouth daily. 08/19/22 08/19/23  [name], MD  escitalopram (LEXAPRO) 10 MG tablet Take 1 tablet (10 mg total) by mouth daily. 08/19/22   [name], MD  glucose blood test strip 1 each  by Other route as needed for other. Use as instructed to test blood sugar daily.  One Touch Verio Test Strips DX: E11.65 10/13/19   [name], MD  lisinopril-hydrochlorothiazide (ZESTORETIC) 20-25 MG tablet Take 1 tablet by mouth daily. 08/19/22   [name], MD  megestrol (MEGACE) 400 MG/10ML suspension Take 10 mLs (400 mg total) by mouth 2 (two) times daily. 08/19/22   [name], MD  metFORMIN (GLUCOPHAGE) 1000 MG tablet Take 1 tablet (1,000 mg total) by mouth 2 (two) times daily with a meal. 08/19/22   [name], MD  potassium chloride SA (KLOR-CON M) 20 MEQ tablet Take 1 tablet (20 mEq total) by mouth daily. 08/19/22   [name], MD  prochlorperazine (COMPAZINE) 10 MG tablet Take 1 tablet (10 mg total) by mouth every 6 (six) hours as needed (Nausea or vomiting). 10/10/21 02/26/22  Doreatha Massed, MD      Allergies    Patient has no known allergies.    Review of Systems   Review of Systems  Unable to perform ROS: Acuity of condition  Neurological:  Positive for weakness.    Physical Exam Updated Vital Signs BP (!) 153/87   Pulse (!) 118   Temp 98.2 F (36.8 C) (Rectal)   Resp 13   Ht 1.676 m (5\' 6" )   Wt 75 kg   SpO2 99%   BMI 26.69 kg/m  Physical Exam Vitals  and nursing note reviewed.  Constitutional:      General: He is not in acute distress.    Appearance: He is well-developed.  HENT:     Head: Normocephalic and atraumatic.     Mouth/Throat:     Mouth: Mucous membranes are dry.     Pharynx: No oropharyngeal exudate.  Eyes:     General: No scleral icterus.       Right eye: No discharge.        Left eye: No discharge.     Conjunctiva/sclera: Conjunctivae normal.     Pupils: Pupils are equal, round, and reactive to light.  Neck:     Thyroid: No thyromegaly.     Vascular: No JVD.  Cardiovascular:     Rate and Rhythm: Regular rhythm. Tachycardia present.     Heart sounds: Normal heart sounds. No murmur heard.    No friction rub. No gallop.   Pulmonary:     Effort: Pulmonary effort is normal. No respiratory distress.     Breath sounds: Normal breath sounds. No wheezing or rales.  Abdominal:     General: Bowel sounds are normal. There is no distension.     Palpations: Abdomen is soft. There is no mass.     Tenderness: There is no abdominal tenderness.  Genitourinary:    Comments: Indwelling Foley catheter, urine appears to be a creamy brown color Musculoskeletal:        General: No tenderness. Normal range of motion.     Cervical back: Normal range of motion and neck supple.     Right lower leg: No edema.     Left lower leg: No edema.  Lymphadenopathy:     Cervical: No cervical adenopathy.  Skin:    General: Skin is warm and dry.     Findings: Rash present. No erythema.     Comments: Decubitus ulcer spreading from the sacral region halfway up his back  Neurological:     Mental Status: He is alert.     Coordination: Coordination normal.     Comments: The patient is awake, eyes are open, able to follow some simple commands but does not answer questions, he will not nod his head or shake his head yes or no, he winces with pain when we try to roll him  Psychiatric:        Behavior: Behavior normal.     ED Results / Procedures / Treatments   Labs (all labs ordered are listed, but only abnormal results are displayed) Labs Reviewed  LACTIC ACID, PLASMA - Abnormal; Notable for the following components:      Result Value   Lactic Acid, Venous 3.8 (*)    All other components within normal limits  COMPREHENSIVE METABOLIC PANEL - Abnormal; Notable for the following components:   Sodium 134 (*)    CO2 17 (*)    Glucose, Bld 129 (*)    BUN 41 (*)    Creatinine, Ser 2.15 (*)    Calcium 8.2 (*)    Albumin 2.1 (*)    GFR, Estimated 31 (*)    All other components within normal limits  CBC WITH DIFFERENTIAL/PLATELET - Abnormal; Notable for the following components:   WBC 15.5 (*)    RBC 3.28 (*)    Hemoglobin 10.1 (*)     HCT 30.8 (*)    nRBC 0.5 (*)    Neutro Abs 13.1 (*)    Abs Immature Granulocytes 0.16 (*)    All other components within  normal limits  PROTIME-INR - Abnormal; Notable for the following components:   Prothrombin Time 15.6 (*)    All other components within normal limits  APTT - Abnormal; Notable for the following components:   aPTT 37 (*)    All other components within normal limits  RESP PANEL BY RT-PCR (RSV, FLU A&B, COVID)  RVPGX2  CULTURE, BLOOD (ROUTINE X 2)  CULTURE, BLOOD (ROUTINE X 2)  LACTIC ACID, PLASMA  URINALYSIS, W/ REFLEX TO CULTURE (INFECTION SUSPECTED)    EKG EKG Interpretation Date/Time:  Sunday January 25 2023 15:47:48 EDT Ventricular Rate:  127 PR Interval:  129 QRS Duration:  85 QT Interval:  287 QTC Calculation: 418 R Axis:   81  Text Interpretation: Sinus tachycardia Borderline right axis deviation Borderline T wave abnormalities Confirmed by Eber Hong ([id number]) on 01/25/2023 3:57:51 PM  Radiology DG Chest Port 1 View  Result Date: 01/25/2023 CLINICAL DATA:  Sepsis, history of prostate cancer EXAM: PORTABLE CHEST 1 VIEW COMPARISON:  02/25/2022 FINDINGS: Single frontal view of the chest demonstrates a stable right chest wall port. The cardiac silhouette is stable. No acute airspace disease, effusion, or pneumothorax. No acute bony abnormalities. IMPRESSION: 1. Stable chest, no acute process. Electronically Signed   By: Sharlet Salina M.D.   On: 01/25/2023 16:51    Procedures .Critical Care  Performed by: Eber Hong, MD Authorized by: Eber Hong, MD   Critical care provider statement:    Critical care time (minutes):  45   Critical care time was exclusive of:  Separately billable procedures and treating other patients and teaching time   Critical care was necessary to treat or prevent imminent or life-threatening deterioration of the following conditions:  Sepsis and renal failure   Critical care was time spent personally by me on the following  activities:  Development of treatment plan with patient or surrogate, discussions with consultants, evaluation of patient's response to treatment, examination of patient, obtaining history from patient or surrogate, review of old charts, re-evaluation of patient's condition, pulse oximetry, ordering and review of radiographic studies, ordering and review of laboratory studies and ordering and performing treatments and interventions   I assumed direction of critical care for this patient from another provider in my specialty: no     Care discussed with: admitting provider   Comments:           Medications Ordered in ED Medications  lactated ringers infusion ( Intravenous New Bag/Given 01/25/23 1632)  ceFEPIme (MAXIPIME) 2 g in sodium chloride 0.9 % 100 mL IVPB (has no administration in time range)  lactated ringers bolus 1,000 mL (1,000 mLs Intravenous New Bag/Given 01/25/23 1620)    And  lactated ringers bolus 1,000 mL (1,000 mLs Intravenous New Bag/Given 01/25/23 1636)    And  lactated ringers bolus 500 mL (500 mLs Intravenous New Bag/Given 01/25/23 1620)  ceFEPIme (MAXIPIME) 2 g in sodium chloride 0.9 % 100 mL IVPB (2 g Intravenous New Bag/Given 01/25/23 1614)  ondansetron (ZOFRAN) injection 4 mg (4 mg Intravenous Given 01/25/23 1703)    ED Course/ Medical Decision Making/ A&P                             Medical Decision Making Amount and/or Complexity of Data Reviewed Labs: ordered. Radiology: ordered. ECG/medicine tests: ordered.  Risk Prescription drug management. Decision regarding hospitalization.    This patient presents to the ED for concern of severe failure to thrive, this involves an  extensive number of treatment options, and is a complaint that carries with it a high risk of complications and morbidity.  The differential diagnosis includes sepsis especially given that he is tachycardic and warm to the touch with what appears to be an obvious urine infection, dehydration,  this could be a metabolic crisis as well given that he is a diabetic   Co morbidities that complicate the patient evaluation  Metastatic prostate cancer, diabetes   Additional history obtained:  Additional history obtained from oncology External records from outside source obtained and reviewed including prior notes from Dr. Ellin Saba with the cancer service who states that the patient has a history of prostate cancer with spread to the lymph nodes, his last cycle of chemotherapy was July 02, 2022 It was determined at that visit that because he was continuing to lose weight and becoming weaker that they were going to discontinue the chemotherapy and changed over to supportive care and hospice   Lab Tests:  I Ordered, and personally interpreted labs.  The pertinent results include: Cytosis, elevated lactic acid of 3.8, acute kidney injury with creatinine has bumped up to 2.0   Imaging Studies ordered:  I ordered imaging studies including chest x-ray I independently visualized and interpreted imaging which showed chest x-ray without acute infiltrates I agree with the radiologist interpretation   Cardiac Monitoring: / EKG:  The patient was maintained on a cardiac monitor.  I personally viewed and interpreted the cardiac monitored which showed an underlying rhythm of: This tachycardia   Consultations Obtained:  I requested consultation with the hospitalist Dr. Darnelle Catalan,  and discussed lab and imaging findings as well as pertinent plan - they recommend: Admission to the hospital   Problem List / ED Course / Critical interventions / Medication management  I have discussed the care of the patient with his spouse at 3:50 PM, she reports that he would want to have resuscitative efforts including IV fluids and antibiotics, I asked if they wanted everything done if his heart were to stop and he said give it 1 try that make him comfortable I ordered medication including IV fluids, lactated  Ringer's and antibiotics for urinary infection Reevaluation of the patient after these medicines showed that the patient  I have reviewed the patients home medicines and have made adjustments as needed   Social Determinants of Health:  Critically ill, hospice patient, family request full code   Test / Admission - Considered:  Admit to higher level of care         Final Clinical Impression(s) / ED Diagnoses Final diagnoses:  Severe sepsis (HCC)  AKI (acute kidney injury) [facility])    Rx / DC Orders ED Discharge Orders     None         Eber Hong, MD 01/25/23 1824

## 2023-01-25 NOTE — Sepsis Progress Note (Signed)
Elink following code sepsis °

## 2023-01-25 NOTE — H&P (Addendum)
History and Physical:    Andrew [name]   IEP:[id number] DOB: Jan 07, 1946 DOA: 01/25/2023  Referring MD/provider: Eber Hong PCP: [name], MD   Patient coming from: Home  Chief Complaint: Poor PO intake, bed sores.  History of Present Illness:   Andrew [name] is an 77 y.o. male with a PMH of metastatic castration refractory prostate cancer, followed by Dr. Ellin Saba, last seen 08/28/2022 who was noted to be tolerating chemotherapy poorly at that time and he was recommended to pursue hospice care.  No family are currently present and the patient is unable to provide much history.  He tells me that he would like something to eat.  He cannot provide any details about his functional status.  He does endorse feeling dizzy but denies pain, nausea, or vomiting.  He does have a chronic indwelling Foley catheter and a Port-A-Cath.  EMS brought him to the ED after the hospice nurse called and asked him to be transported for antibiotics and fluids.  His spouse is listed as his emergency contact. The EDP indicates that he had a discussion with the family regarding Mr. Seaberg code status and they wanted "everything done." Wife indicates that he hasn't had a good appetite x 2 weeks. I spoke to her by telephone and she told me that he would not want to be resuscitated, but that they would like to treat what is treatable.   ED Course:  Mr. Hardiman was lethargic upon arrival to the ED. He was given 2.5 liters of LR, and a dose of Cefepime for suspected sepsis. CXR was negative for pneumonia. Respiratory panel was negative.  Lactic acid was 3.8 and 7.7 on repeat.  Chemistries show a sodium of 134, BUN 41, creatinine 2.15 (baseline 1.32), albumin 2.1.  CBC shows a WBC of 15.5 with a shift to the left, hemoglobin 10.1.  Mildly coagulopathic with a PTT of 37 and PT of 15.6/INR 1.2.  He was a bit more interactive with me after receiving fluid volume resuscitation.  ROS:   Review of Systems  Reason unable to  perform ROS: Lethargic with inability to provide details of his HPI.    Past Medical History:   Past Medical History:  Diagnosis Date   AKI (acute kidney injury) (HCC) 10/04/2021   Anemia    BPH (benign prostatic hyperplasia)    Sees Dr Jerre Simon   Cataract    Diabetes mellitus without complication [facility])    Educated about COVID-19 virus infection 07/18/2019   Genetic testing 05/09/2021   [facility] Multi-Cancer Panel was Negative. Report date is 05/07/2021.  The Multi-Cancer + RNA Panel offered by [facility] includes sequencing and/or deletion/duplication analysis of the following 84 genes:  AIP*, ALK, APC*, ATM*, AXIN2*, BAP1*, BARD1*, BLM*, BMPR1A*, BRCA1*, BRCA2*, BRIP1*, CASR, CDC73*, CDH1*, CDK4, CDKN1B*, CDKN1C*, CDKN2A, CEBPA, CHEK2*, CTNNA1*, DICER1*, DIS3L2*, EGFR, EPCAM, FH*, F   Hyperlipidemia    Hypertension    Low serum vitamin D    Port-A-Cath in place 10/09/2021   Prostate cancer (HCC) 2008   w/seed implantation and radiation    Past Surgical History:   Past Surgical History:  Procedure Laterality Date   COLONOSCOPY  2015,2018   PORTACATH PLACEMENT Right 10/17/2021   Procedure: INSERTION PORT-A-CATH, Coralyn Pear;  Surgeon: Lewie Chamber, DO;  Location: [name];  Service: General;  Laterality: Right;  pt needs to have glucose checked AM of surgery   PROSTATE SURGERY  2008   seed implant    Social History:  Social History   Socioeconomic History   Marital status: Married    Spouse name: Mary   Number of children: 2   Years of education: 12   Highest education level: 12th grade  Occupational History   Occupation: [name]    Comment: Retired Public librarian  Tobacco Use   Smoking status: Every Day    Current packs/day: 1.00    Average packs/day: 1 pack/day for 57.6 years (57.6 ttl pk-yrs)    Types: Cigarettes    Start date: 06/30/1965   Smokeless tobacco: Never   Tobacco comments:    Has tried nicotine replacement gum  Vaping Use   Vaping status: Never  Used  Substance and Sexual Activity   Alcohol use: No   Drug use: No   Sexual activity: Yes  Other Topics Concern   Not on file  Social History Narrative   Retired, lives at home with wife Corrie Dandy, two grown children, enjoys gardening     Social Determinants of Health   Financial Resource Strain: Medium Risk (04/14/2022)   Overall Financial Resource Strain (CARDIA)    Difficulty of Paying Living Expenses: Somewhat hard  Food Insecurity: No Food Insecurity (03/07/2022)   Hunger Vital Sign    Worried About Running Out of Food in the Last Year: Never true    Ran Out of Food in the Last Year: Never true  Transportation Needs: No Transportation Needs (03/07/2022)   PRAPARE - Administrator, Civil Service (Medical): No    Lack of Transportation (Non-Medical): No  Physical Activity: Inactive (01/27/2022)   Exercise Vital Sign    Days of Exercise per Week: 0 days    Minutes of Exercise per Session: 0 min  Stress: No Stress Concern Present (03/07/2022)   [employer] of Occupational Health - Occupational Stress Questionnaire    Feeling of Stress : Only a little  Social Connections: Moderately Integrated (01/27/2022)   Social Connection and Isolation Panel [NHANES]    Frequency of Communication with Friends and Family: More than three times a week    Frequency of Social Gatherings with Friends and Family: Once a week    Attends Religious Services: More than 4 times per year    Active Member of [employer] or Organizations: No    Attends Banker Meetings: Never    Marital Status: Married  Catering manager Violence: Not At Risk (01/27/2022)   Humiliation, Afraid, Rape, and Kick questionnaire    Fear of Current or Ex-Partner: No    Emotionally Abused: No    Physically Abused: No    Sexually Abused: No    Allergies   Patient has no known allergies.  Family history:   Family History  Problem Relation Age of Onset   Diabetes Mother    Heart disease Mother    Kidney  disease Mother        DIALYSIS   Emphysema Father    Deep vein thrombosis Sister    Kidney disease Sister    Lung cancer Sister    Colon polyps Brother    Hypertension Brother    Gout Brother    Colon cancer Neg Hx    Esophageal cancer Neg Hx    Rectal cancer Neg Hx    Prostate cancer Neg Hx     Current Medications:   Prior to Admission medications   Medication Sig Start Date End Date Taking? Authorizing Provider  acetaminophen (TYLENOL) 650 MG CR tablet Take 1,300 mg by mouth every 8 (  eight) hours as needed for pain.    [provider]  alfuzosin (UROXATRAL) 10 MG 24 hr tablet Take 1 tablet (10 mg total) by mouth at bedtime. 08/19/22   [name], MD  aspirin 81 MG tablet Take 1 tablet (81 mg total) by mouth daily with breakfast. 10/18/21   Pappayliou, Santina Evans A, DO  atorvastatin (LIPITOR) 40 MG tablet Take 1 tablet (40 mg total) by mouth at bedtime. 08/19/22   [name], MD  cholecalciferol (VITAMIN D) 1000 UNITS tablet Take 1,000 Units by mouth daily.     [provider]  DOCEtaxel (TAXOTERE IV) Inject into the vein every 21 ( twenty-one) days. 10/14/21   [provider]  docusate sodium (COLACE) 100 MG capsule Take 1 capsule (100 mg total) by mouth 2 (two) times daily. 08/19/22 08/19/23  [name], MD  Empagliflozin-linaGLIPtin (GLYXAMBI) 25-5 MG TABS Take 1 tablet by mouth daily. 08/19/22 08/19/23  [name], MD  escitalopram (LEXAPRO) 10 MG tablet Take 1 tablet (10 mg total) by mouth daily. 08/19/22   [name], MD  glucose blood test strip 1 each by Other route as needed for other. Use as instructed to test blood sugar daily.  One Touch Verio Test Strips DX: E11.65 10/13/19   [name], MD  lisinopril-hydrochlorothiazide (ZESTORETIC) 20-25 MG tablet Take 1 tablet by mouth daily. 08/19/22   [name], MD  megestrol (MEGACE) 400 MG/10ML suspension Take 10 mLs (400 mg total) by mouth 2 (two) times daily. 08/19/22   [name],  MD  metFORMIN (GLUCOPHAGE) 1000 MG tablet Take 1 tablet (1,000 mg total) by mouth 2 (two) times daily with a meal. 08/19/22   [name], MD  potassium chloride SA (KLOR-CON M) 20 MEQ tablet Take 1 tablet (20 mEq total) by mouth daily. 08/19/22   [name], MD  prochlorperazine (COMPAZINE) 10 MG tablet Take 1 tablet (10 mg total) by mouth every 6 (six) hours as needed (Nausea or vomiting). 10/10/21 02/26/22  Doreatha Massed, MD    Physical Exam:   Vitals:   01/25/23 1729 01/25/23 1730 01/25/23 1731 01/25/23 1933  BP: (!) 153/87 (!) 155/79  (!) 152/87  Pulse: (!) 118 (!) 113    Resp: 13  14 15   Temp:      TempSrc:      SpO2: 99% 100%  100%  Weight:      Height:         Physical Exam: Blood pressure (!) 152/87, pulse (!) 113, temperature 98.2 F (36.8 C), temperature source Rectal, resp. rate 15, height 5\' 6"  (1.676 m), weight 75 kg, SpO2 100%. Gen: No acute distress. Chronically ill appearing Head: Normocephalic, atraumatic. Eyes: Pupils equal, round and reactive to light. Extraocular movements intact.  Sclerae nonicteric. No lid lag. Mouth: Oropharynx reveals dry mucous membranes. Edentulous. Neck: Supple, no thyromegaly, no lymphadenopathy, no jugular venous distention. Chest: Lungs are clear to auscultation with good air movement. Rhonchi anteriorly. Porta-Cath left chest wall. CV: Heart sounds are tachy/regular with an S1, S2. No murmurs, rubs, clicks, or gallops.  Abdomen: Soft, nontender, nondistended with normal active bowel sounds. No hepatosplenomegaly or palpable masses. Extremities: Extremities are without clubbing, or cyanosis. No edema. Pedal pulses 2+.  Skin: Cool and clammy. Scattered ecchymosis. Stage II decubitus ulcers posteriorly. Neuro: Lethargic, disoriented, weak,  grossly nonfocal.  Psych: Insight is good and judgment is impaired. Mood and affect flat.   Data Review:    Labs: Basic Metabolic Panel: Recent Labs  Lab 01/25/23 1548  NA 134*   K 3.8  CL 102  CO2 17*  GLUCOSE 129*  BUN 41*  CREATININE 2.15*  CALCIUM 8.2*   Liver Function Tests: Recent Labs  Lab 01/25/23 1548  AST 24  ALT 20  ALKPHOS 78  BILITOT 1.0  PROT 6.5  ALBUMIN 2.1*   CBC: Recent Labs  Lab 01/25/23 1548  WBC 15.5*  NEUTROABS 13.1*  HGB 10.1*  HCT 30.8*  MCV 93.9  PLT 244    Urinalysis    Component Value Date/Time   COLORURINE BROWN (A) 01/25/2023 1757   APPEARANCEUR CLOUDY (A) 01/25/2023 1757   APPEARANCEUR Cloudy (A) 09/26/2021 1510   LABSPEC >1.030 (H) 01/25/2023 1757   PHURINE 6.5 01/25/2023 1757   GLUCOSEU NEGATIVE 01/25/2023 1757   HGBUR LARGE (A) 01/25/2023 1757   BILIRUBINUR MODERATE (A) 01/25/2023 1757   BILIRUBINUR Negative 09/26/2021 1510   KETONESUR NEGATIVE 01/25/2023 1757   PROTEINUR >300 (A) 01/25/2023 1757   UROBILINOGEN negative 07/19/2014 1002   NITRITE POSITIVE (A) 01/25/2023 1757   LEUKOCYTESUR MODERATE (A) 01/25/2023 1757      Radiographic Studies: DG Chest Port 1 View  Result Date: 01/25/2023 CLINICAL DATA:  Sepsis, history of prostate cancer EXAM: PORTABLE CHEST 1 VIEW COMPARISON:  02/25/2022 FINDINGS: Single frontal view of the chest demonstrates a stable right chest wall port. The cardiac silhouette is stable. No acute airspace disease, effusion, or pneumothorax. No acute bony abnormalities. IMPRESSION: 1. Stable chest, no acute process. Electronically Signed   By: Sharlet Salina M.D.   On: 01/25/2023 16:51    EKG: Not performed yet.   Assessment/Plan:   Principal Problem:   Sepsis (HCC) likely from a urinary source Patient has evidence of nitrites and leukocytes in his urine and with his known history of widely metastatic prostate cancer, this is likely the source of his sepsis with bone marrow failure likely contributory.  His prognosis is very poor but his family would like to treat the treatable and therefore would like IV fluids and antibiotics although his wife does confirm that he would  not want to be resuscitated in the event of a cardiopulmonary arrest.  Broad-spectrum antibiotics with cefepime have been initiated given his hemodynamic instability and rising lactate.    Active Problems:   Type 2 diabetes mellitus without complications (HCC) Glucose 129.  Would avoid putting him on insulin or oral hypoglycemics given his risk of hypoglycemia with poor oral intake.    Benign prostatic hyperplasia with urinary obstruction Mr. Calderas has a chronic indwelling Foley catheter.    Essential hypertension Systolic blood pressure mildly elevated.  Can consider an oral antihypertensive if this trend persists.    Hyperlipidemia associated with type 2 diabetes mellitus (HCC) Would hold statin for now.  Once he proves he can protect his airway, could consider resuming.    Prostate cancer (HCC)/protein calorie malnutrition Widely metastatic and not a candidate for further chemotherapy.  He is currently on hospice care.  Would maximize comfort while treating the treatable.  His albumin is low, and he has evidence of severe protein calorie malnutrition with muscle wasting on exam.  Will order Ensure supplements.    Stage II decubitus ulcers Will get a wound care RN consult.    Body mass index is 26.69 kg/m.  Other information:   DVT prophylaxis: Lovenox ordered. Code Status: DO NOT RESUSCITATE, confirmed after speaking with the patient's wife by telephone. Family Communication: By telephone. Disposition Plan: Home in 2-3 days if stable. Consults called: None. Admission  status: Inpatient.  The medical decision making on this patient was of high complexity and the patient is at high risk for clinical deterioration, therefore this is a level 3 visit.   Trula Ore Aashika Carta [facility]    How to contact the [facility] Attending or Consulting provider 7A - 7P or covering provider during after hours 7P -7A, for this patient?   Check the care team in [facility] and look for a)  attending/consulting [facility] provider listed and b) the [facility] team listed Log into [email or web address] and use [place]'s universal password to access. If you do not have the password, please contact the hospital operator. Locate the [facility] provider you are looking for under [facility] and page to a number that you can be directly reached. If you still have difficulty reaching the provider, please page the [facility] (Director on Call) for the Hospitalists listed on amion for assistance.  01/25/2023, 8:13 PM

## 2023-01-25 NOTE — Consult Note (Signed)
WOC Nurse Consult Note: Reason for Consult:stage 2 PIs to buttocks. Wound type:Pressure plus moisture. Guidance for these wounds is provided by the standing order skin care order set. Consult is performed remotely after review of the EMR. Pressure Injury POA: Yes Measurement:To be obtained by bedside RN with application of next dressing today. Wound bed:red, moist Drainage (amount, consistency, odor) small Periwound: erythema Dressing procedure/placement/frequency: Turning and repositioning is in place, I have added to time in the supine position to be minimized. Bilateral pressure redistribution heel boots are provided. While in house, placement on a mattress replacement with low air loss feature is recommended (provided). Topical care will be to cleanse the lesions daily then dress with antimicrobial nonadherent (xeroform) gauze topped with dry gauze and secured with silicone foam.  WOC nursing team will not follow, but will remain available to this patient, the nursing and medical teams.  Please re-consult if needed.  Thank you for inviting [place] to participate in this patient's Plan of Care.  Ladona Mow, MSN, RN, CNS, GNP, Leda Min, [employer], [employer] phone:  [phone]

## 2023-01-25 NOTE — Progress Notes (Signed)
Pharmacy Antibiotic Note  Andrew [name] is a 77 y.o. male admitted on 01/25/2023 with sepsis.  Pharmacy has been consulted for cefepime dosing.  Patient has AKI with sCr 2.15 - CrCl ~25 mL/min  Plan: Cefepime 2g Q24H Monitor for resolution of AKI  Height: 5\' 6"  (167.6 cm) Weight: 75 kg (165 lb 5.5 oz) IBW/kg (Calculated) : 63.8  No data recorded.  No results for input(s): "WBC", "CREATININE", "LATICACIDVEN", "VANCOTROUGH", "VANCOPEAK", "VANCORANDOM", "GENTTROUGH", "GENTPEAK", "GENTRANDOM", "TOBRATROUGH", "TOBRAPEAK", "TOBRARND", "AMIKACINPEAK", "AMIKACINTROU", "AMIKACIN" in the last 168 hours.  CrCl cannot be calculated (Patient's most recent lab result is older than the maximum 21 days allowed.).    No Known Allergies   Thank you for allowing pharmacy to be a part of this patient's care.  Eldridge Scot, PharmD Clinical Pharmacist 01/25/2023, 4:14 PM

## 2023-01-26 DIAGNOSIS — L03119 Cellulitis of unspecified part of limb: Secondary | ICD-10-CM | POA: Diagnosis not present

## 2023-01-26 DIAGNOSIS — I10 Essential (primary) hypertension: Secondary | ICD-10-CM

## 2023-01-26 LAB — CBC
HCT: 26 % — ABNORMAL LOW (ref 39.0–52.0)
Hemoglobin: 8.4 g/dL — ABNORMAL LOW (ref 13.0–17.0)
MCH: 30.8 pg (ref 26.0–34.0)
MCHC: 32.3 g/dL (ref 30.0–36.0)
MCV: 95.2 fL (ref 80.0–100.0)
Platelets: 171 10*3/uL (ref 150–400)
RBC: 2.73 MIL/uL — ABNORMAL LOW (ref 4.22–5.81)
RDW: 15.2 % (ref 11.5–15.5)
WBC: 12.2 10*3/uL — ABNORMAL HIGH (ref 4.0–10.5)
nRBC: 0.4 % — ABNORMAL HIGH (ref 0.0–0.2)

## 2023-01-26 LAB — GLUCOSE, CAPILLARY: Glucose-Capillary: 81 mg/dL (ref 70–99)

## 2023-01-26 MED ORDER — AMLODIPINE BESYLATE 5 MG PO TABS
5.0000 mg | ORAL_TABLET | Freq: Every day | ORAL | Status: DC
  Administered 2023-01-26 – 2023-01-28 (×3): 5 mg via ORAL
  Filled 2023-01-26 (×3): qty 1

## 2023-01-26 MED ORDER — VANCOMYCIN HCL 750 MG/150ML IV SOLN
750.0000 mg | INTRAVENOUS | Status: DC
  Administered 2023-01-27 – 2023-01-28 (×2): 750 mg via INTRAVENOUS
  Filled 2023-01-26 (×2): qty 150

## 2023-01-26 MED ORDER — VANCOMYCIN HCL 1500 MG/300ML IV SOLN
1500.0000 mg | Freq: Once | INTRAVENOUS | Status: AC
  Administered 2023-01-26: 1500 mg via INTRAVENOUS
  Filled 2023-01-26: qty 300

## 2023-01-26 MED ORDER — CHLORHEXIDINE GLUCONATE CLOTH 2 % EX PADS
6.0000 | MEDICATED_PAD | Freq: Every day | CUTANEOUS | Status: DC
  Administered 2023-01-26 – 2023-01-28 (×3): 6 via TOPICAL

## 2023-01-26 MED ORDER — FENTANYL CITRATE PF 50 MCG/ML IJ SOSY
25.0000 ug | PREFILLED_SYRINGE | INTRAMUSCULAR | Status: DC | PRN
  Administered 2023-01-27: 25 ug via INTRAVENOUS
  Filled 2023-01-26: qty 1

## 2023-01-26 MED ORDER — LORAZEPAM 2 MG/ML IJ SOLN: 0.5000 mg | Freq: Four times a day (QID) | INTRAMUSCULAR | Status: DC | PRN

## 2023-01-26 MED ORDER — NYSTATIN-TRIAMCINOLONE 100000-0.1 UNIT/GM-% EX OINT
1.0000 | TOPICAL_OINTMENT | Freq: Two times a day (BID) | CUTANEOUS | Status: DC
  Filled 2023-01-26 (×2): qty 15

## 2023-01-26 MED ORDER — MEGESTROL ACETATE 400 MG/10ML PO SUSP
400.0000 mg | Freq: Two times a day (BID) | ORAL | Status: DC
  Administered 2023-01-26 – 2023-01-28 (×4): 400 mg via ORAL
  Filled 2023-01-26 (×4): qty 10

## 2023-01-26 MED ORDER — FENTANYL CITRATE PF 50 MCG/ML IJ SOSY
25.0000 ug | PREFILLED_SYRINGE | INTRAMUSCULAR | Status: DC | PRN
Start: 2023-01-26 — End: 2023-01-26

## 2023-01-26 NOTE — Progress Notes (Signed)
Bladder scanned less 25cc Verified twice Night Provider notified

## 2023-01-26 NOTE — Progress Notes (Signed)
PROGRESS NOTE     Andrew [name], is a 77 y.o. male, DOB - 1945-08-11, NWG:[id number]  Admit date - 01/25/2023   Admitting Physician [name], MD  Outpatient Primary MD for the patient is [name], MD  LOS - 1  Chief Complaint  Patient presents with   Weakness        Brief Narrative:  77 y.o. male with a PMH of metastatic castration refractory prostate cancer, followed by Dr. Ellin Saba, last seen 08/28/2022 who was noted to be tolerating chemotherapy poorly at that time and he was recommended to pursue hospice care admitted on 01/25/2023 with sepsis from presumed skin and urinary source   -Assessment and Plan: 1) strep bacteremia and sepsis---- POA -skin related please see photos in epic -Continue vancomycin  2) possible UTI/urosepsis--- continue cefepime pending culture data - 3)DM2-prior A1c 5.9 reflecting excellent diabetic control PTA -Watch carefully risk for hypoglycemia given poor oral intake  4)FTT/anorexia--- reorder Megace, give nutritional supplements including Ensure  5)HTN--- hold off on lisinopril hydrochlorothiazide due to poor oral intake and risk of AKI -Instead use amlodipine  6)Social/Ethics--- plans being made for patient to transition to hospice care -Currently DNR/DNI -Lorazepam and fentanyl as needed   Status is: Inpatient   Disposition: The patient is from: Home              Anticipated d/c is to: SNF              Anticipated d/c date is: 2 days              Patient currently is not medically stable to d/c. Barriers: Not Clinically Stable-   Code Status :  -  Code Status: DNR   Family Communication:     (patient is alert, awake and coherent)   DVT Prophylaxis  :   - SCDs  enoxaparin (LOVENOX) injection 30 mg Start: 01/26/23 1000   Lab Results  Component Value Date   PLT 171 01/26/2023    Inpatient Medications  Scheduled Meds:  Chlorhexidine Gluconate Cloth  6 each Topical Daily   docusate sodium  100 mg Oral BID    enoxaparin (LOVENOX) injection  30 mg Subcutaneous Q24H   feeding supplement  237 mL Oral BID BM   nystatin-triamcinolone ointment  1 Application Topical BID   sodium chloride flush  3 mL Intravenous Q12H   Continuous Infusions:  ceFEPime (MAXIPIME) IV Stopped (01/26/23 1728)   lactated ringers 125 mL/hr at 01/26/23 1658   [START ON 01/27/2023] vancomycin     PRN Meds:.acetaminophen **OR** acetaminophen, bisacodyl, morphine injection, ondansetron **OR** ondansetron (ZOFRAN) IV, polyethylene glycol, sodium phosphate   Anti-infectives (From admission, onward)    Start     Dose/Rate Route Frequency Ordered Stop   01/27/23 1000  vancomycin (VANCOREADY) IVPB 750 mg/150 mL        750 mg 150 mL/hr over 60 Minutes Intravenous Every 24 hours 01/26/23 1021     01/26/23 1600  ceFEPIme (MAXIPIME) 2 g in sodium chloride 0.9 % 100 mL IVPB        2 g 200 mL/hr over 30 Minutes Intravenous Every 24 hours 01/25/23 1813 02/01/23 1559   01/26/23 1030  vancomycin (VANCOREADY) IVPB 1500 mg/300 mL        1,500 mg 150 mL/hr over 120 Minutes Intravenous  Once 01/26/23 0933 01/26/23 1300   01/25/23 1600  ceFEPIme (MAXIPIME) 2 g in sodium chloride 0.9 % 100 mL IVPB        2  g 200 mL/hr over 30 Minutes Intravenous  Once 01/25/23 1548 01/25/23 1644       Subjective: Andrew [name] today has no fevers, no emesis,  No chest pain,  - Poor intake    Objective: Vitals:   01/26/23 0554 01/26/23 1043 01/26/23 1355 01/26/23 1948  BP: 119/67 123/68 127/77 (!) 140/77  Pulse: (!) 108 100 (!) 104 (!) 101  Resp: 15 12 14 18   Temp: 98 F (36.7 C) 98.6 F (37 C) 98.2 F (36.8 C) 98.3 F (36.8 C)  TempSrc:  Oral Oral   SpO2: 96% 100% 100% 100%  Weight:      Height:        Intake/Output Summary (Last 24 hours) at 01/26/2023 2008 Last data filed at 01/26/2023 1949 Gross per 24 hour  Intake 2570 ml  Output 250 ml  Net 2320 ml   Filed Weights   01/25/23 1534  Weight: 75 kg   Physical Exam  Gen:-Frail and  chronically ill-appearing  HEENT:- [place].AT, No sclera icterus Neck-Supple Neck,No JVD,.  Lungs-  CTAB , fair symmetrical air movement CV- S1, S2 normal, regular  Abd-  +ve B.Sounds, Abd Soft, No tenderness,    Psych-affect is that, oriented x3 Neuro-generalized weakness, no new focal deficits, no tremors Extremity/Skin:- see photos below GU--Foley in situ -  Media Information  Document Information  Photos    01/26/2023 17:17  Attached To:  Hospital Encounter on 01/25/23  Source Information  [name], MD  [facility]  Document History       Media Information  Document Information  Photos  Buttocks.. back and thighs  01/26/2023 17:16  Attached To:  Hospital Encounter on 01/25/23  Source Information  [name], MD  [facility]  Document History       Media Information  Document Information  Photos  Back and extremities  01/26/2023 17:15  Attached To:  Hospital Encounter on 01/25/23  Source Information  [name], MD  [facility]  Document History       Media Information  Document Information  Photos  Abd wall  01/26/2023 17:12  Attached To:  Hospital Encounter on 01/25/23  Source Information  [name], MD  [facility]  Document History      Media Information  Document Information  Photos  Lower abd wall fold with macerated areas  01/26/2023 17:11  Attached To:  Hospital Encounter on 01/25/23  Source Information  [name], MD  [facility]  Document History     Data Reviewed: I have personally reviewed following labs and imaging studies  CBC: Recent Labs  Lab 01/25/23 1548 01/25/23 2236 01/26/23 0639  WBC 15.5* 13.1* 12.2*  NEUTROABS 13.1*  --   --   HGB 10.1* 9.7* 8.4*  HCT 30.8* 30.3* 26.0*  MCV 93.9 96.8 95.2  PLT 244 203 171   Basic Metabolic Panel: Recent Labs  Lab 01/25/23 1548 01/25/23 2236 01/26/23 0409  NA 134*  --  135  K 3.8  --  4.4  CL 102  --  103  CO2 17*  --   19*  GLUCOSE 129*  --  109*  BUN 41*  --  39*  CREATININE 2.15* 2.01* 1.99*  CALCIUM 8.2*  --  8.1*   GFR: Estimated Creatinine Clearance: 28.1 mL/min (A) (by C-G formula based on SCr of 1.99 mg/dL (H)). Liver Function Tests: Recent Labs  Lab 01/25/23 1548  AST 24  ALT 20  ALKPHOS 78  BILITOT 1.0  PROT  6.5  ALBUMIN 2.1*   Recent Results (from the past 240 hour(s))  Resp panel by RT-PCR (RSV, Flu A&B, Covid) Anterior Nasal Swab     Status: None   Collection Time: 01/25/23  3:48 PM   Specimen: Anterior Nasal Swab  Result Value Ref Range Status   SARS Coronavirus 2 by RT PCR NEGATIVE NEGATIVE Final    Comment: (NOTE) SARS-CoV-2 target nucleic acids are NOT DETECTED.  The SARS-CoV-2 RNA is generally detectable in upper respiratory specimens during the acute phase of infection. The lowest concentration of SARS-CoV-2 viral copies this assay can detect is 138 copies/mL. A negative result does not preclude SARS-Cov-2 infection and should not be used as the sole basis for treatment or other patient management decisions. A negative result may occur with  improper specimen collection/handling, submission of specimen other than nasopharyngeal swab, presence of viral mutation(s) within the areas targeted by this assay, and inadequate number of viral copies(<138 copies/mL). A negative result must be combined with clinical observations, patient history, and epidemiological information. The expected result is Negative.  Fact Sheet for Patients:  BloggerCourse.com  Fact Sheet for Healthcare Providers:  SeriousBroker.it  This test is no t yet approved or cleared by the [place] FDA and  has been authorized for detection and/or diagnosis of SARS-CoV-2 by FDA under an Emergency Use Authorization (EUA). This EUA will remain  in effect (meaning this test can be used) for the duration of the COVID-19 declaration under Section 564(b)(1)  of the Act, 21 U.S.C.section 360bbb-3(b)(1), unless the authorization is terminated  or revoked sooner.       Influenza A by PCR NEGATIVE NEGATIVE Final   Influenza B by PCR NEGATIVE NEGATIVE Final    Comment: (NOTE) The Xpert Xpress SARS-CoV-2/FLU/RSV plus assay is intended as an aid in the diagnosis of influenza from Nasopharyngeal swab specimens and should not be used as a sole basis for treatment. Nasal washings and aspirates are unacceptable for Xpert Xpress SARS-CoV-2/FLU/RSV testing.  Fact Sheet for Patients: BloggerCourse.com  Fact Sheet for Healthcare Providers: SeriousBroker.it  This test is not yet approved or cleared by the [place] FDA and has been authorized for detection and/or diagnosis of SARS-CoV-2 by FDA under an Emergency Use Authorization (EUA). This EUA will remain in effect (meaning this test can be used) for the duration of the COVID-19 declaration under Section 564(b)(1) of the Act, 21 U.S.C. section 360bbb-3(b)(1), unless the authorization is terminated or revoked.     Resp Syncytial Virus by PCR NEGATIVE NEGATIVE Final    Comment: (NOTE) Fact Sheet for Patients: BloggerCourse.com  Fact Sheet for Healthcare Providers: SeriousBroker.it  This test is not yet approved or cleared by the [place] FDA and has been authorized for detection and/or diagnosis of SARS-CoV-2 by FDA under an Emergency Use Authorization (EUA). This EUA will remain in effect (meaning this test can be used) for the duration of the COVID-19 declaration under Section 564(b)(1) of the Act, 21 U.S.C. section 360bbb-3(b)(1), unless the authorization is terminated or revoked.  Performed at [facility], [address] [place], [place] [address]   Blood Culture (routine x 2)     Status: None (Preliminary result)   Collection Time: 01/25/23  3:53 PM   Specimen: Porta Cath;  Blood  Result Value Ref Range Status   Specimen Description PORTA CATH BOTTLES DRAWN AEROBIC AND ANAEROBIC  Final   Special Requests   Final    Blood Culture results may not be optimal due to an excessive  volume of blood received in culture bottles   Culture   Final    NO GROWTH < 24 HOURS Performed at [facility], [address], [place], [place] [address]    Report Status PENDING  Incomplete  Blood Culture (routine x 2)     Status: None (Preliminary result)   Collection Time: 01/25/23  4:33 PM   Specimen: Right Antecubital; Blood  Result Value Ref Range Status   Specimen Description   Final    RIGHT ANTECUBITAL BLOOD Performed at [facility], [address] [place], [place] [address]    Special Requests   Final    Blood Culture adequate volume Performed at [facility], [address] [place], [place] [address]    Culture  Setup Time   Final    GRAM POSITIVE COCCI IN CHAINS AEROBIC BOTTLE ONLY Gram Stain Report Called to,Read Back By and Verified With: [facility] 01/26/23 0857 NMN CRITICAL RESULT CALLED TO, READ BACK BY AND VERIFIED WITH: Joannie [place] [id number] @ 1529 FH Performed at [facility], [address], [place], [place] [address]    Culture GRAM POSITIVE COCCI  Final   Report Status PENDING  Incomplete  Blood Culture ID Panel (Reflexed)     Status: Abnormal   Collection Time: 01/25/23  4:33 PM  Result Value Ref Range Status   Enterococcus faecalis NOT DETECTED NOT DETECTED Final   Enterococcus Faecium NOT DETECTED NOT DETECTED Final   Listeria monocytogenes NOT DETECTED NOT DETECTED Final   Staphylococcus species NOT DETECTED NOT DETECTED Final   Staphylococcus aureus (BCID) NOT DETECTED NOT DETECTED Final   Staphylococcus epidermidis NOT DETECTED NOT DETECTED Final   Staphylococcus lugdunensis NOT DETECTED NOT DETECTED Final   Streptococcus species DETECTED (A) NOT DETECTED Final    Comment: CRITICAL RESULT CALLED TO, READ BACK BY AND VERIFIED  WITH: PHARMD E. MADUEME [id number] @ 1529 FH    Streptococcus agalactiae DETECTED (A) NOT DETECTED Final    Comment: CRITICAL RESULT CALLED TO, READ BACK BY AND VERIFIED WITH: PHARMD E. MADUEME [id number] @ 1529 FH    Streptococcus pneumoniae NOT DETECTED NOT DETECTED Final   Streptococcus pyogenes NOT DETECTED NOT DETECTED Final   A.calcoaceticus-baumannii NOT DETECTED NOT DETECTED Final   Bacteroides fragilis NOT DETECTED NOT DETECTED Final   Enterobacterales NOT DETECTED NOT DETECTED Final   Enterobacter cloacae complex NOT DETECTED NOT DETECTED Final   Escherichia coli NOT DETECTED NOT DETECTED Final   Klebsiella aerogenes NOT DETECTED NOT DETECTED Final   Klebsiella oxytoca NOT DETECTED NOT DETECTED Final   Klebsiella pneumoniae NOT DETECTED NOT DETECTED Final   Proteus species NOT DETECTED NOT DETECTED Final   Salmonella species NOT DETECTED NOT DETECTED Final   Serratia marcescens NOT DETECTED NOT DETECTED Final   Haemophilus influenzae NOT DETECTED NOT DETECTED Final   Neisseria meningitidis NOT DETECTED NOT DETECTED Final   Pseudomonas aeruginosa NOT DETECTED NOT DETECTED Final   Stenotrophomonas maltophilia NOT DETECTED NOT DETECTED Final   Candida albicans NOT DETECTED NOT DETECTED Final   Candida auris NOT DETECTED NOT DETECTED Final   Candida glabrata NOT DETECTED NOT DETECTED Final   Candida krusei NOT DETECTED NOT DETECTED Final   Candida parapsilosis NOT DETECTED NOT DETECTED Final   Candida tropicalis NOT DETECTED NOT DETECTED Final   Cryptococcus neoformans/gattii NOT DETECTED NOT DETECTED Final    Comment: Performed at [facility], [address], [place], [place] [address]     Radiology Studies: DG  Chest Port 1 View  Result Date: 01/25/2023 CLINICAL DATA:  Sepsis, history of prostate cancer EXAM: PORTABLE CHEST 1 VIEW COMPARISON:  02/25/2022 FINDINGS: Single frontal view of the chest demonstrates a stable right chest wall port. The cardiac silhouette is stable.  No acute airspace disease, effusion, or pneumothorax. No acute bony abnormalities. IMPRESSION: 1. Stable chest, no acute process. Electronically Signed   By: Sharlet Salina M.D.   On: 01/25/2023 16:51     Scheduled Meds:  Chlorhexidine Gluconate Cloth  6 each Topical Daily   docusate sodium  100 mg Oral BID   enoxaparin (LOVENOX) injection  30 mg Subcutaneous Q24H   feeding supplement  237 mL Oral BID BM   nystatin-triamcinolone ointment  1 Application Topical BID   sodium chloride flush  3 mL Intravenous Q12H   Continuous Infusions:  ceFEPime (MAXIPIME) IV Stopped (01/26/23 1728)   lactated ringers 125 mL/hr at 01/26/23 1658   [START ON 01/27/2023] vancomycin      LOS: 1 day   [name] M.D on 01/26/2023 at 8:08 PM  Go to [email or web address] - for contact info  [facility] - Office  [phone]  If 7PM-7AM, please contact night-coverage [email or web address] 01/26/2023, 8:08 PM

## 2023-01-26 NOTE — Progress Notes (Signed)
Pt admitted due to sepsis. Pt's wife reports he is bed bound. He is active with [facility]. LCSW spoke with Rae Halsted at [facility] to confirm. Anticipate return home with hospice at d/c. [facility] will follow.    01/26/23 0804  TOC Brief Assessment  Insurance and Status Reviewed  Patient has primary care physician Yes  Home environment has been reviewed Lives with wife.  Prior level of function: Bed bound. Has hospice with [facility].  Prior/Current Home Services Current home services [facility])  Social Determinants of Health Reivew SDOH reviewed no interventions necessary  Readmission risk has been reviewed Yes  Transition of care needs no transition of care needs at this time

## 2023-01-26 NOTE — Progress Notes (Signed)
PHARMACY - PHYSICIAN COMMUNICATION CRITICAL VALUE ALERT - BLOOD CULTURE IDENTIFICATION (BCID)  Andrew [name] is an 77 y.o. male who presented to [facility] on 01/25/2023 with a chief complaint of failure to thrive and bed sores.   Assessment:  Patient with bed sores and concern for urosepsis. Now with strep bacteremia.   Name of physician (or Provider) Contacted: Emokpae  Current antibiotics: Vancomycin and cefepime  Changes to prescribed antibiotics recommended:  Recommendations declined by provider - infection may be polymicrobial   Results for orders placed or performed during the hospital encounter of 01/25/23  Blood Culture ID Panel (Reflexed) (Collected: 01/25/2023  4:33 PM)  Result Value Ref Range   Enterococcus faecalis NOT DETECTED NOT DETECTED   Enterococcus Faecium NOT DETECTED NOT DETECTED   Listeria monocytogenes NOT DETECTED NOT DETECTED   Staphylococcus species NOT DETECTED NOT DETECTED   Staphylococcus aureus (BCID) NOT DETECTED NOT DETECTED   Staphylococcus epidermidis NOT DETECTED NOT DETECTED   Staphylococcus lugdunensis NOT DETECTED NOT DETECTED   Streptococcus species DETECTED (A) NOT DETECTED   Streptococcus agalactiae DETECTED (A) NOT DETECTED   Streptococcus pneumoniae NOT DETECTED NOT DETECTED   Streptococcus pyogenes NOT DETECTED NOT DETECTED   A.calcoaceticus-baumannii NOT DETECTED NOT DETECTED   Bacteroides fragilis NOT DETECTED NOT DETECTED   Enterobacterales NOT DETECTED NOT DETECTED   Enterobacter cloacae complex NOT DETECTED NOT DETECTED   Escherichia coli NOT DETECTED NOT DETECTED   Klebsiella aerogenes NOT DETECTED NOT DETECTED   Klebsiella oxytoca NOT DETECTED NOT DETECTED   Klebsiella pneumoniae NOT DETECTED NOT DETECTED   Proteus species NOT DETECTED NOT DETECTED   Salmonella species NOT DETECTED NOT DETECTED   Serratia marcescens NOT DETECTED NOT DETECTED   Haemophilus influenzae NOT DETECTED NOT DETECTED   Neisseria meningitidis NOT  DETECTED NOT DETECTED   Pseudomonas aeruginosa NOT DETECTED NOT DETECTED   Stenotrophomonas maltophilia NOT DETECTED NOT DETECTED   Candida albicans NOT DETECTED NOT DETECTED   Candida auris NOT DETECTED NOT DETECTED   Candida glabrata NOT DETECTED NOT DETECTED   Candida krusei NOT DETECTED NOT DETECTED   Candida parapsilosis NOT DETECTED NOT DETECTED   Candida tropicalis NOT DETECTED NOT DETECTED   Cryptococcus neoformans/gattii NOT DETECTED NOT DETECTED    Sheppard Coil PharmD., BCPS Clinical Pharmacist 01/26/2023 3:57 PM

## 2023-01-26 NOTE — Progress Notes (Signed)
Pharmacy Antibiotic Note  Andrew [name] is a 77 y.o. male admitted on 01/25/2023 with  failure to thrive and bed sores .  Pharmacy has been consulted for vancomycin dosing for possible urosepsis and decubitus ulcers.   Patient currently afebrile, wbc slightly elevated at 12.2, scr elevated at 1.99 which does seem up from baseline. U/A positive for nitrites and leukocytes.   Given fluctuations in renal function will use traditional dosing.   Plan: Vancomycin 750 IV every 24 hours.  Goal trough 15-20 mcg/mL. Continue cefepime 2g q24 hours  Height: 5\' 6"  (167.6 cm) Weight: 75 kg (165 lb 5.5 oz) IBW/kg (Calculated) : 63.8  Temp (24hrs), Avg:98.2 F (36.8 C), Min:98 F (36.7 C), Max:98.6 F (37 C)  Recent Labs  Lab 01/25/23 1548 01/25/23 1752 01/25/23 1941 01/25/23 2236 01/26/23 0409 01/26/23 0639  WBC 15.5*  --   --  13.1*  --  12.2*  CREATININE 2.15*  --   --  2.01* 1.99*  --   LATICACIDVEN 3.8* 7.7* 8.7* 8.0*  --   --     Estimated Creatinine Clearance: 28.1 mL/min (A) (by C-G formula based on SCr of 1.99 mg/dL (H)).    No Known Allergies  Thank you for allowing pharmacy to be a part of this patient's care.  Sheppard Coil PharmD., BCPS Clinical Pharmacist 01/26/2023 10:20 AM

## 2023-01-27 DIAGNOSIS — B9620 Unspecified Escherichia coli [E. coli] as the cause of diseases classified elsewhere: Secondary | ICD-10-CM | POA: Diagnosis not present

## 2023-01-27 DIAGNOSIS — N390 Urinary tract infection, site not specified: Secondary | ICD-10-CM

## 2023-01-27 DIAGNOSIS — I10 Essential (primary) hypertension: Secondary | ICD-10-CM | POA: Diagnosis not present

## 2023-01-27 DIAGNOSIS — E43 Unspecified severe protein-calorie malnutrition: Secondary | ICD-10-CM | POA: Diagnosis not present

## 2023-01-27 LAB — GLUCOSE, CAPILLARY
Glucose-Capillary: 77 mg/dL (ref 70–99)
Glucose-Capillary: 74 mg/dL (ref 70–99)
Glucose-Capillary: 74 mg/dL (ref 70–99)
Glucose-Capillary: 80 mg/dL (ref 70–99)

## 2023-01-27 MED ORDER — ZINC OXIDE 40 % EX OINT
TOPICAL_OINTMENT | Freq: Two times a day (BID) | CUTANEOUS | Status: DC
  Administered 2023-01-28: 1 via TOPICAL
  Filled 2023-01-27: qty 57

## 2023-01-27 MED ORDER — ENOXAPARIN SODIUM 40 MG/0.4ML IJ SOSY
40.0000 mg | PREFILLED_SYRINGE | INTRAMUSCULAR | Status: DC
  Administered 2023-01-27 – 2023-01-28 (×2): 40 mg via SUBCUTANEOUS
  Filled 2023-01-27: qty 0.4

## 2023-01-27 MED ORDER — NYSTATIN-TRIAMCINOLONE 100000-0.1 UNIT/GM-% EX CREA
TOPICAL_CREAM | Freq: Two times a day (BID) | CUTANEOUS | Status: DC
  Administered 2023-01-28: 1 via TOPICAL
  Filled 2023-01-27: qty 15

## 2023-01-27 MED ORDER — OXYCODONE HCL 5 MG PO TABS
5.0000 mg | ORAL_TABLET | ORAL | Status: DC | PRN
  Administered 2023-01-27 (×2): 5 mg via ORAL
  Filled 2023-01-27 (×2): qty 1

## 2023-01-27 NOTE — Consult Note (Signed)
Consult placed for wounds to back.  Previous consult placed for Stage 2 buttocks and orders have been written.    WOC Nurse Consult Note: Reason for Consult: back, buttocks and thigh wounds  Wound type: partial thickness skin loss likely due to moisture  ICD-10 CM Codes for Irritant Dermatitis L24A2 - Due to fecal, urinary or dual incontinence  Pressure Injury POA: Yes, stage 2 buttocks  Measurement: widespread across back, hips, buttocks, thighs  Wound bed: partial thickness skin loss, pink moist  Drainage (amount, consistency, odor) minimal serosanguinous  Periwound: intact  Dressing procedure/placement/frequency:  Clean back, buttocks, hips and thighs with Vashe wound care cleanser [employer] [place] [phone]) , dry and apply a thin layer of Desitin 2 times daily.  DO NOT ATTEMPT TO SCRUB DESITIN OFF UNLESS SOILED WITH STOOL. If becomes soiled with stool gently wipe stool off Desitin and do not scrub.   Had RN order a low air loss mattress for patient at this visit as I think this will help with moisture management, pressure redistribution and pain.     POC discussed with bedside nurse and primary MD.   WOC team will not follow at this time.  Re-consult if further needs arise.   Thank you,    Priscella Mann MSN, RN-BC, [employer] [phone]

## 2023-01-27 NOTE — [place] FL2 (Signed)
[place] MEDICAID FL2 LEVEL OF CARE FORM     IDENTIFICATION  Patient Name: Andrew [name] Birthdate: [birth year] Sex: male Admission Date (Current Location): 01/25/2023  [facility] and [place] Number:  [employer] and Address:  [facility],  [address] [place] [address]      Provider Number: [id number]  Attending Physician Name and Address:  [name], MD  Relative Name and Phone Number:       Current Level of Care: Hospital Recommended Level of Care: Skilled Nursing Facility Prior Approval Number:    Date Approved/Denied:   PASRR Number: [id number]  Discharge Plan: SNF    Current Diagnoses: Patient Active Problem List   Diagnosis Date Noted   Sepsis (HCC) 01/25/2023   Protein calorie malnutrition (HCC) 01/25/2023   Iron deficiency anemia 12/02/2021   Port-A-Cath in place 10/09/2021   Hyperglycemia due to diabetes mellitus (HCC) 10/04/2021   Aortic atherosclerosis (HCC) 01/18/2021   Primary insomnia 07/26/2020   Sacroiliitis (HCC) 07/26/2020   Nocturia 02/08/2020   Abnormal electrocardiogram 07/18/2019   Type 2 diabetes mellitus with complication, without long-term current use of insulin (HCC) 07/18/2019   SOB (shortness of breath) 12/10/2016   Erectile dysfunction due to arterial insufficiency 10/30/2016   Polyp of colon 10/30/2016   Peripheral vascular insufficiency (HCC) 06/13/2016   Vitamin D deficiency 08/24/2015   Prostate cancer (HCC) 04/07/2014   Type 2 diabetes mellitus without complications (HCC) 09/14/2010   Benign prostatic hyperplasia with urinary obstruction 09/14/2010   Essential hypertension 09/14/2010   Hyperlipidemia associated with type 2 diabetes mellitus (HCC) 09/14/2010   Tobacco abuse 09/14/2010    Orientation RESPIRATION BLADDER Height & Weight     Self  Normal Indwelling catheter Weight: 165 lb 5.5 oz (75 kg) Height:  5\' 6"  (167.6 cm)  BEHAVIORAL SYMPTOMS/MOOD NEUROLOGICAL BOWEL NUTRITION STATUS       Incontinent Diet (Dysphagia 3. See d/c summary for updates.)  AMBULATORY STATUS COMMUNICATION OF NEEDS Skin   Extensive Assist Verbally Other (Comment) (stage II to lower back, stage II to sacrum, stage II to left thigh with foam dressing. Intertriginous dermatitis abdomen/hip.)                       Personal Care Assistance Level of Assistance  Bathing, Feeding, Dressing Bathing Assistance: Maximum assistance Feeding assistance: Limited assistance Dressing Assistance: Maximum assistance     Functional Limitations Info  Sight, Hearing, Speech Sight Info: Adequate Hearing Info: Adequate Speech Info: Adequate    SPECIAL CARE FACTORS FREQUENCY                       Contractures      Additional Factors Info  Code Status, Allergies, Psychotropic Code Status Info: DNR Allergies Info: No known allergies Psychotropic Info: Lexapro         Current Medications (01/27/2023):  This is the current hospital active medication list Current Facility-Administered Medications  Medication Dose Route Frequency Provider Last Rate Last Admin   acetaminophen (TYLENOL) tablet 650 mg  650 mg Oral Q6H PRN Rama, Maryruth Bun, MD       Or   acetaminophen (TYLENOL) suppository 650 mg  650 mg Rectal Q6H PRN Rama, Maryruth Bun, MD       amLODipine (NORVASC) tablet 5 mg  5 mg Oral Daily Emokpae, Courage, MD   5 mg at 01/27/23 0102   bisacodyl (DULCOLAX) suppository 10 mg  10 mg Rectal Daily PRN Rama, Trula Ore  P, MD       ceFEPIme (MAXIPIME) 2 g in sodium chloride 0.9 % 100 mL IVPB  2 g Intravenous Q24H Rama, Maryruth Bun, MD   Stopped at 01/26/23 1728   Chlorhexidine Gluconate Cloth 2 % PADS 6 each  6 each Topical Daily Emokpae, Courage, MD   6 each at 01/27/23 1216   docusate sodium (COLACE) capsule 100 mg  100 mg Oral BID Rama, Maryruth Bun, MD   100 mg at 01/27/23 0824   enoxaparin (LOVENOX) injection 40 mg  40 mg Subcutaneous Q24H Emokpae, Courage, MD   40 mg at 01/27/23 1218   feeding  supplement (ENSURE ENLIVE / ENSURE PLUS) liquid 237 mL  237 mL Oral BID BM Rama, Maryruth Bun, MD   237 mL at 01/27/23 1342   fentaNYL (SUBLIMAZE) injection 25 mcg  25 mcg Intravenous Q2H PRN [name], MD   25 mcg at 01/27/23 0453   lactated ringers infusion   Intravenous Continuous Rama, Maryruth Bun, MD 125 mL/hr at 01/27/23 0831 New Bag at 01/27/23 0831   liver oil-zinc oxide (DESITIN) 40 % ointment   Topical BID Emokpae, Courage, MD       LORazepam (ATIVAN) injection 0.5 mg  0.5 mg Intravenous Q6H PRN Mariea Clonts, Courage, MD       megestrol (MEGACE) 400 MG/10ML suspension 400 mg  400 mg Oral BID Mariea Clonts, Courage, MD   400 mg at 01/27/23 7425   nystatin-triamcinolone (MYCOLOG II) cream   Topical BID Madueme, Elvira C, RPH       ondansetron (ZOFRAN) tablet 4 mg  4 mg Oral Q6H PRN Rama, Maryruth Bun, MD   4 mg at 01/27/23 1355   Or   ondansetron (ZOFRAN) injection 4 mg  4 mg Intravenous Q6H PRN Rama, Maryruth Bun, MD       oxyCODONE (Oxy IR/ROXICODONE) immediate release tablet 5 mg  5 mg Oral Q4H PRN Emokpae, Courage, MD   5 mg at 01/27/23 1356   polyethylene glycol (MIRALAX / GLYCOLAX) packet 17 g  17 g Oral Daily PRN Rama, Maryruth Bun, MD       sodium chloride flush (NS) 0.9 % injection 3 mL  3 mL Intravenous Q12H Rama, Maryruth Bun, MD   3 mL at 01/27/23 0824   sodium phosphate (FLEET) 7-19 GM/118ML enema 1 enema  1 enema Rectal Once PRN Rama, Maryruth Bun, MD       vancomycin (VANCOREADY) IVPB 750 mg/150 mL  750 mg Intravenous Q24H Earnie Larsson, RPH 150 mL/hr at 01/27/23 0830 750 mg at 01/27/23 0830   Facility-Administered Medications Ordered in Other Encounters  Medication Dose Route Frequency Provider Last Rate Last Admin   0.9 %  sodium chloride infusion   Intravenous Continuous Doreatha Massed, MD 20 mL/hr at 01/08/22 1513 Infusion Verify at 01/08/22 1513     Discharge Medications: Please see discharge summary for a list of discharge medications.  Relevant Imaging  Results:  Relevant Lab Results:   Additional Information SSN: [id number]. Active with [facility].  Karn Cassis, LCSW

## 2023-01-27 NOTE — Progress Notes (Signed)
PROGRESS NOTE  Andrew [name], is a 77 y.o. male, DOB - 1945-12-05, ZOX:[id number]  Admit date - 01/25/2023   Admitting Physician [name], MD  Outpatient Primary MD for the patient is [name], MD  LOS - 2  Chief Complaint  Patient presents with   Weakness      Brief Narrative:  77 y.o. male with a PMH of metastatic castration refractory prostate cancer, followed by Dr. Ellin Saba, last seen 08/28/2022 who was noted to be tolerating chemotherapy poorly at that time and he was recommended to pursue hospice care admitted on 01/25/2023 with sepsis from presumed skin and urinary source   -Assessment and Plan: 1) strep bacteremia and sepsis---- POA -Suspect this is skin related--- please see photos in epic -Continue vancomycin -No echo, or TEE as patient is a hospice patient with limitations to workup and treatment  2) E. coli UTI/urosepsis--- continue cefepime pending further culture data - 3) extensive skin lesions/cellulitis--- please see #1 above -Wound care consult appreciated--recommendations noted -Please see photos in epic  4)FTT/anorexia--- reorder Megace, c/n  nutritional supplements including Ensure  5)HTN--- hold off on lisinopril hydrochlorothiazide due to poor oral intake and risk of AKI -Instead use amlodipine  6)Social/Ethics--- hospice patient -Currently DNR/DNI Discussed with Patient's brother [name] and sister-in-law [name] at bedside, questions answered -Treat the treatable without being too aggressive -Lorazepam and fentanyl as needed  7)DM2-prior A1c 5.9 reflecting excellent diabetic control PTA -Watch carefully risk for hypoglycemia given poor oral intake  Status is: Inpatient   Disposition: The patient is from: Home              Anticipated d/c is to: SNF              Anticipated d/c date is: 1 days              Patient currently is not medically stable to d/c. Barriers: Not Clinically Stable-   Code Status :  -  Code Status: DNR    Family Communication:     Discussed with Patient's brother [name] and sister-in-law [name] at bedside, questions answered  DVT Prophylaxis  :   - SCDs  enoxaparin (LOVENOX) injection 40 mg Start: 01/27/23 1000  Lab Results  Component Value Date   PLT 157 01/27/2023   Inpatient Medications  Scheduled Meds:  amLODipine  5 mg Oral Daily   Chlorhexidine Gluconate Cloth  6 each Topical Daily   docusate sodium  100 mg Oral BID   enoxaparin (LOVENOX) injection  40 mg Subcutaneous Q24H   feeding supplement  237 mL Oral BID BM   liver oil-zinc oxide   Topical BID   megestrol  400 mg Oral BID   nystatin-triamcinolone   Topical BID   sodium chloride flush  3 mL Intravenous Q12H   Continuous Infusions:  ceFEPime (MAXIPIME) IV 2 g (01/27/23 1629)   lactated ringers 125 mL/hr at 01/27/23 0831   vancomycin 750 mg (01/27/23 0830)   PRN Meds:.acetaminophen **OR** acetaminophen, bisacodyl, fentaNYL (SUBLIMAZE) injection, LORazepam, ondansetron **OR** ondansetron (ZOFRAN) IV, oxyCODONE, polyethylene glycol, sodium phosphate   Anti-infectives (From admission, onward)    Start     Dose/Rate Route Frequency Ordered Stop   01/27/23 1000  vancomycin (VANCOREADY) IVPB 750 mg/150 mL        750 mg 150 mL/hr over 60 Minutes Intravenous Every 24 hours 01/26/23 1021     01/26/23 1600  ceFEPIme (MAXIPIME) 2 g in sodium chloride 0.9 % 100 mL IVPB  2 g 200 mL/hr over 30 Minutes Intravenous Every 24 hours 01/25/23 1813 02/01/23 1559   01/26/23 1030  vancomycin (VANCOREADY) IVPB 1500 mg/300 mL        1,500 mg 150 mL/hr over 120 Minutes Intravenous  Once 01/26/23 0933 01/26/23 1300   01/25/23 1600  ceFEPIme (MAXIPIME) 2 g in sodium chloride 0.9 % 100 mL IVPB        2 g 200 mL/hr over 30 Minutes Intravenous  Once 01/25/23 1548 01/25/23 1644       Subjective: Einar Grad today has no fevers, no emesis,  No chest pain,  - Poor intake  Patient's brother [name] and sister-in-law [name] at  bedside, questions answered  Objective: Vitals:   01/26/23 1948 01/26/23 2117 01/27/23 0325 01/27/23 1211  BP: (!) 140/77 121/74 121/70 124/66  Pulse: (!) 101 (!) 105 98 (!) 101  Resp: 18 20 16 16   Temp: 98.3 F (36.8 C) 97.9 F (36.6 C) 98.5 F (36.9 C) 98.4 F (36.9 C)  TempSrc:  Oral  Oral  SpO2: 100% 100% 100% 97%  Weight:      Height:        Intake/Output Summary (Last 24 hours) at 01/27/2023 1930 Last data filed at 01/27/2023 1500 Gross per 24 hour  Intake 1310 ml  Output 250 ml  Net 1060 ml   Filed Weights   01/25/23 1534  Weight: 75 kg   Physical Exam  Gen:-Frail and chronically ill-appearing  HEENT:- [place].AT, No sclera icterus Neck-Supple Neck,No JVD,.  Lungs-  CTAB , fair symmetrical air movement CV- S1, S2 normal, regular  Abd-  +ve B.Sounds, Abd Soft, No tenderness,    Psych-affect is that, oriented x3 Neuro-generalized weakness, no new focal deficits, no tremors Extremity/Skin:- see photos below GU--Foley in situ -  Media Information  Document Information  Photos    01/26/2023 17:17  Attached To:  Hospital Encounter on 01/25/23  Source Information  [name], MD  [facility]  Document History       Media Information  Document Information  Photos  Buttocks.. back and thighs  01/26/2023 17:16  Attached To:  Hospital Encounter on 01/25/23  Source Information  [name], MD  [facility]  Document History       Media Information  Document Information  Photos  Back and extremities  01/26/2023 17:15  Attached To:  Hospital Encounter on 01/25/23  Source Information  [name], MD  [facility]  Document History       Media Information  Document Information  Photos  Abd wall  01/26/2023 17:12  Attached To:  Hospital Encounter on 01/25/23  Source Information  [name], MD  [facility]  Document History      Media Information  Document Information  Photos  Lower abd wall fold with  macerated areas  01/26/2023 17:11  Attached To:  Hospital Encounter on 01/25/23  Source Information  [name], MD  [facility]  Document History     Data Reviewed: I have personally reviewed following labs and imaging studies  CBC: Recent Labs  Lab 01/25/23 1548 01/25/23 2236 01/26/23 0639 01/27/23 0418  WBC 15.5* 13.1* 12.2* 8.7  NEUTROABS 13.1*  --   --   --   HGB 10.1* 9.7* 8.4* 8.0*  HCT 30.8* 30.3* 26.0* 24.6*  MCV 93.9 96.8 95.2 95.3  PLT 244 203 171 157   Basic Metabolic Panel: Recent Labs  Lab 01/25/23 1548 01/25/23 2236 01/26/23 0409 01/27/23 0418  NA 134*  --  135 135  K 3.8  --  4.4 3.9  CL 102  --  103 105  CO2 17*  --  19* 23  GLUCOSE 129*  --  109* 79  BUN 41*  --  39* 40*  CREATININE 2.15* 2.01* 1.99* 1.70*  CALCIUM 8.2*  --  8.1* 7.9*  PHOS  --   --   --  2.4*   GFR: Estimated Creatinine Clearance: 32.8 mL/min (A) (by C-G formula based on SCr of 1.7 mg/dL (H)). Liver Function Tests: Recent Labs  Lab 01/25/23 1548 01/27/23 0418  AST 24  --   ALT 20  --   ALKPHOS 78  --   BILITOT 1.0  --   PROT 6.5  --   ALBUMIN 2.1* 1.6*   Recent Results (from the past 240 hour(s))  Resp panel by RT-PCR (RSV, Flu A&B, Covid) Anterior Nasal Swab     Status: None   Collection Time: 01/25/23  3:48 PM   Specimen: Anterior Nasal Swab  Result Value Ref Range Status   SARS Coronavirus 2 by RT PCR NEGATIVE NEGATIVE Final    Comment: (NOTE) SARS-CoV-2 target nucleic acids are NOT DETECTED.  The SARS-CoV-2 RNA is generally detectable in upper respiratory specimens during the acute phase of infection. The lowest concentration of SARS-CoV-2 viral copies this assay can detect is 138 copies/mL. A negative result does not preclude SARS-Cov-2 infection and should not be used as the sole basis for treatment or other patient management decisions. A negative result may occur with  improper specimen collection/handling, submission of specimen other than  nasopharyngeal swab, presence of viral mutation(s) within the areas targeted by this assay, and inadequate number of viral copies(<138 copies/mL). A negative result must be combined with clinical observations, patient history, and epidemiological information. The expected result is Negative.  Fact Sheet for Patients:  BloggerCourse.com  Fact Sheet for Healthcare Providers:  SeriousBroker.it  This test is no t yet approved or cleared by the [place] FDA and  has been authorized for detection and/or diagnosis of SARS-CoV-2 by FDA under an Emergency Use Authorization (EUA). This EUA will remain  in effect (meaning this test can be used) for the duration of the COVID-19 declaration under Section 564(b)(1) of the Act, 21 U.S.C.section 360bbb-3(b)(1), unless the authorization is terminated  or revoked sooner.       Influenza A by PCR NEGATIVE NEGATIVE Final   Influenza B by PCR NEGATIVE NEGATIVE Final    Comment: (NOTE) The Xpert Xpress SARS-CoV-2/FLU/RSV plus assay is intended as an aid in the diagnosis of influenza from Nasopharyngeal swab specimens and should not be used as a sole basis for treatment. Nasal washings and aspirates are unacceptable for Xpert Xpress SARS-CoV-2/FLU/RSV testing.  Fact Sheet for Patients: BloggerCourse.com  Fact Sheet for Healthcare Providers: SeriousBroker.it  This test is not yet approved or cleared by the [place] FDA and has been authorized for detection and/or diagnosis of SARS-CoV-2 by FDA under an Emergency Use Authorization (EUA). This EUA will remain in effect (meaning this test can be used) for the duration of the COVID-19 declaration under Section 564(b)(1) of the Act, 21 U.S.C. section 360bbb-3(b)(1), unless the authorization is terminated or revoked.     Resp Syncytial Virus by PCR NEGATIVE NEGATIVE Final    Comment:  (NOTE) Fact Sheet for Patients: BloggerCourse.com  Fact Sheet for Healthcare Providers: SeriousBroker.it  This test is not yet approved or cleared by the [place] FDA and has been authorized for detection and/or  diagnosis of SARS-CoV-2 by FDA under an Emergency Use Authorization (EUA). This EUA will remain in effect (meaning this test can be used) for the duration of the COVID-19 declaration under Section 564(b)(1) of the Act, 21 U.S.C. section 360bbb-3(b)(1), unless the authorization is terminated or revoked.  Performed at [facility], [address], [place], [place] [address]   Blood Culture (routine x 2)     Status: None (Preliminary result)   Collection Time: 01/25/23  3:53 PM   Specimen: Porta Cath; Blood  Result Value Ref Range Status   Specimen Description PORTA CATH BOTTLES DRAWN AEROBIC AND ANAEROBIC  Final   Special Requests   Final    Blood Culture results may not be optimal due to an excessive volume of blood received in culture bottles   Culture   Final    NO GROWTH 2 DAYS Performed at [facility], [address], [place], [place] [address]    Report Status PENDING  Incomplete  Blood Culture (routine x 2)     Status: Abnormal (Preliminary result)   Collection Time: 01/25/23  4:33 PM   Specimen: Right Antecubital; Blood  Result Value Ref Range Status   Specimen Description   Final    RIGHT ANTECUBITAL BLOOD Performed at [facility] [place] [facility], [address] [place], [place] [address]    Special Requests   Final    Blood Culture adequate volume Performed at [facility], [address], [place], [place] [address]    Culture  Setup Time   Final    GRAM POSITIVE COCCI IN CHAINS AEROBIC BOTTLE ONLY Gram Stain Report Called to,Read Back By and Verified With: MARY MILLS 01/26/23 0857 NMN CRITICAL RESULT CALLED TO, READ BACK BY AND VERIFIED WITH: PHARMD E. MADUEME [id number] @ 1529 FH    Culture (A)  Final     STREPTOCOCCUS AGALACTIAE SUSCEPTIBILITIES TO FOLLOW GRAM POSITIVE RODS CRITICAL RESULT CALLED TO, READ BACK BY AND VERIFIED WITH: PHARMD Tollie Pizza [phone] fcp Performed at [facility], [address] [place], [place] [address]    Report Status PENDING  Incomplete  Blood Culture ID Panel (Reflexed)     Status: Abnormal   Collection Time: 01/25/23  4:33 PM  Result Value Ref Range Status   Enterococcus faecalis NOT DETECTED NOT DETECTED Final   Enterococcus Faecium NOT DETECTED NOT DETECTED Final   Listeria monocytogenes NOT DETECTED NOT DETECTED Final   Staphylococcus species NOT DETECTED NOT DETECTED Final   Staphylococcus aureus (BCID) NOT DETECTED NOT DETECTED Final   Staphylococcus epidermidis NOT DETECTED NOT DETECTED Final   Staphylococcus lugdunensis NOT DETECTED NOT DETECTED Final   Streptococcus species DETECTED (A) NOT DETECTED Final    Comment: CRITICAL RESULT CALLED TO, READ BACK BY AND VERIFIED WITH: PHARMD E. MADUEME [id number] @ 1529 FH    Streptococcus agalactiae DETECTED (A) NOT DETECTED Final    Comment: CRITICAL RESULT CALLED TO, READ BACK BY AND VERIFIED WITH: PHARMD E. MADUEME [id number] @ 1529 FH    Streptococcus pneumoniae NOT DETECTED NOT DETECTED Final   Streptococcus pyogenes NOT DETECTED NOT DETECTED Final   A.calcoaceticus-baumannii NOT DETECTED NOT DETECTED Final   Bacteroides fragilis NOT DETECTED NOT DETECTED Final   Enterobacterales NOT DETECTED NOT DETECTED Final   Enterobacter cloacae complex NOT DETECTED NOT DETECTED Final   Escherichia coli NOT DETECTED NOT DETECTED Final   Klebsiella aerogenes NOT DETECTED NOT DETECTED Final   Klebsiella oxytoca NOT DETECTED NOT DETECTED Final   Klebsiella pneumoniae NOT DETECTED  NOT DETECTED Final   Proteus species NOT DETECTED NOT DETECTED Final   Salmonella species NOT DETECTED NOT DETECTED Final   Serratia marcescens NOT DETECTED NOT DETECTED Final   Haemophilus influenzae NOT DETECTED NOT DETECTED Final    Neisseria meningitidis NOT DETECTED NOT DETECTED Final   Pseudomonas aeruginosa NOT DETECTED NOT DETECTED Final   Stenotrophomonas maltophilia NOT DETECTED NOT DETECTED Final   Candida albicans NOT DETECTED NOT DETECTED Final   Candida auris NOT DETECTED NOT DETECTED Final   Candida glabrata NOT DETECTED NOT DETECTED Final   Candida krusei NOT DETECTED NOT DETECTED Final   Candida parapsilosis NOT DETECTED NOT DETECTED Final   Candida tropicalis NOT DETECTED NOT DETECTED Final   Cryptococcus neoformans/gattii NOT DETECTED NOT DETECTED Final    Comment: Performed at [facility], [address] [place], [place] [address]  Urine Culture     Status: Abnormal (Preliminary result)   Collection Time: 01/25/23  5:57 PM   Specimen: Urine, Random  Result Value Ref Range Status   Specimen Description   Final    URINE, RANDOM Performed at [facility], [address] [place], [place] [address]    Special Requests   Final    NONE Performed at [facility], [address], [place], [place] [address]    Culture (A)  Final    >=100,000 COLONIES/mL ESCHERICHIA COLI SUSCEPTIBILITIES TO FOLLOW CULTURE REINCUBATED FOR BETTER GROWTH Performed at [facility], [address] [place], [place] [address]    Report Status PENDING  Incomplete    Radiology Studies: No results found.  Scheduled Meds:  amLODipine  5 mg Oral Daily   Chlorhexidine Gluconate Cloth  6 each Topical Daily   docusate sodium  100 mg Oral BID   enoxaparin (LOVENOX) injection  40 mg Subcutaneous Q24H   feeding supplement  237 mL Oral BID BM   liver oil-zinc oxide   Topical BID   megestrol  400 mg Oral BID   nystatin-triamcinolone   Topical BID   sodium chloride flush  3 mL Intravenous Q12H   Continuous Infusions:  ceFEPime (MAXIPIME) IV 2 g (01/27/23 1629)   lactated ringers 125 mL/hr at 01/27/23 0831   vancomycin 750 mg (01/27/23 0830)    LOS: 2 days   [name] M.D on 01/27/2023 at 7:30 PM  Go  to [email or web address] - for contact info  [facility] - Office  [phone]  If 7PM-7AM, please contact night-coverage [email or web address] 01/27/2023, 7:30 PM

## 2023-01-27 NOTE — Progress Notes (Signed)
Patient slept most of the night. Dressing changed to back, sacrum, and abdomen. Prn medication given.Plan of care ongoing.

## 2023-01-27 NOTE — TOC Progression Note (Addendum)
Transition of Care [facility]) - Progression Note    Patient Details  Name: Andrew [name] MRN: [id number] Date of Birth: [birth year]  Transition of Care [facility]) CM/SW Contact  Karn Cassis, [place] Phone Number: 01/27/2023, 2:42 PM  Clinical Narrative:  Per Gertie Exon, they have been working on placement for pt at [facility] as wife cannot care for him at home. LCSW confirmed with wife. LCSW sent referral to [facility] for review. Will follow up with [facility] in AM.       Barriers to Discharge: Continued Medical Work up  Expected Discharge Plan and Services                                               Social Determinants of Health (SDOH) Interventions SDOH Screenings   Food Insecurity: No Food Insecurity (01/26/2023)  Housing: Low Risk  (01/26/2023)  Transportation Needs: No Transportation Needs (01/26/2023)  Utilities: Not At Risk (01/26/2023)  Alcohol Screen: Low Risk  (01/27/2022)  Depression (PHQ2-9): High Risk (08/19/2022)  Financial Resource Strain: Medium Risk (04/14/2022)  Physical Activity: Inactive (01/27/2022)  Social Connections: Moderately Integrated (01/27/2022)  Stress: No Stress Concern Present (03/07/2022)  Tobacco Use: High Risk (01/25/2023)    Readmission Risk Interventions     No data to display

## 2023-01-27 NOTE — Progress Notes (Deleted)
Patient Information  Patient Name Andrew [name], Andrew [name] ([id number]) Legal Sex Male DOB 21-Sep-1945  Room Bed  A317 A317-01    Patient Demographics  Address [address] [place] [address] Phone [phone] (Home) *Preferred*    Basic Information  Date Of Birth 1945/09/04 Gender Identity Male Race Black or African American Ethnic Group Not Hispanic or Latino Preferred Language English    Emergency Contacts  Name Relation Home Work Mobile  [place] Spouse [phone]  [phone]  Zayquan, Ehrler   [phone]   Other Contacts  Name Relation Home Work Mobile  Yanke,Clifton Brother [phone]    Liberty Handy [phone]  [phone]  [place] Daughter   [phone]    Documents on File   Status Date Received Description  Documents for the Patient  EMR Patient Summary Not Received    Historic Radiology Documentation Not Received    [place] E-Signature HIPAA Notice of Privacy Received 09/04/12   [facility] E-Signature HIPAA Notice of Privacy Spanish Received 09/04/12   [place] HIPAA NOTICE OF PRIVACY - Scanned Not Received    Driver's License Not Received    Insurance Card Received (Expired) 10/24/13 HUMANA/WRFM/MM  Advance Directives/Living Will/HCPOA/POA Not Received    Advanced Beneficiary Notice (ABN) Not Received    Insurance Card Received (Expired) 11/16/12 WRFM/BCBS  Release of Information Received 11/16/12 WRFM/MM  AMB Patient Logs/Info Not Received    AMB Provider Completed Forms Not Received  Order [facility]  Insurance Card Not Received (Expired)    E-Signature AOB  Spanish Not Received    AMB Patient Logs/Info  03/08/14   Insurance Card Received (Expired) 07/12/14 humana/wrfm  AMB Outside Consult Note  10/22/14   AMB Outside Consult Note  11/08/14 BAUER MD, B  Other Photo ID Received 10/17/21 EXP 10/26/2024  AMB Patient Logs/Info  11/27/14   AMB Outside Consult Note  11/23/14 Hendricks Milo MD, B   Authorization to Release Protected Health Information  02/01/15 [employer] Medicare Risk Adjustment Review  AMB Outside Consult Note  02/06/15 [facility]  AMB Patient Logs/Info  04/18/15   AMB Outside Consult Note  05/07/15 [facility]  AMB Patient Logs/Info  08/31/15   AMB Outside Consult Note  12/03/15 [facility]  AMB Patient Logs/Info  01/21/16   AMB Correspondence  01/08/16 PROGRESS NOTE  [facility]  AMB Correspondence  01/22/16 7/17 PROGRESS NOTES [facility]  AMB Correspondence  01/29/16 PROGRESS NOTE [facility]  AMB Correspondence  02/14/16 PROGRESS NOTE [facility]  AMB Correspondence  06/05/16 RECALL ASSESSMENT PYRTLE  Insurance Card Received (Expired) 10/30/16 humana 2018  AMB Patient Logs/Info  11/06/16   [place] E-Signature HIPAA Notice of Privacy Signed 07/02/17   Insurance Card Received (Expired) 07/27/17 wrfm humana  AMB Correspondence Received 07/31/17 OFFICE VISIT [facility]  AMB Patient Logs/Info Received 08/07/17   AMB Patient Logs/Info Received 12/11/17   Insurance Card Received (Expired) 01/06/18 Humana  Insurance Card Received (Expired) 01/06/18 new mcr card  Authorization to Release Protected Health Information  (Expired) 03/23/18 Authorization for batch [employer] MRA Review 2019   fbg   03/23/18  AMB Patient Logs/Info Received 04/19/18   Insurance Card Received (Expired) 08/11/18 uhc mcr Fredrich Romans  [place] E-Signature HIPAA Notice of Privacy Received 08/11/18 wrfm  AMB Patient Logs/Info Received 08/24/18   AMB Correspondence Received 12/01/18 [facility] [place] OFFICE NOTES  Insurance Card Received (Expired) 07/05/19 WRFM/UHC MEDICARE  Authorization to Release Protected Health Information  09/13/19   AMB  Correspondence Received 09/26/19 RECALL ASSESSMENT PYRTLE  Release of Information Received 10/19/19 [facility] DPR  Insurance Card Received (Expired) 02/08/20  UHC Medicare  AMB Correspondence Received 02/28/20 UHC NM SCAN  AMB Correspondence Received 06/05/20 PATIENT ASSISTANCE INFO  Insurance Card     Insurance Card Not Received 07/11/20 pt didn't have card today  Insurance Card Received (Expired) 08/16/20 uhc medicare  AMB Correspondence Received 08/22/20 EXAM UHC  AMB Correspondence Received 03/06/21 AUTH FOR BONE SCAN  Release of Information Received 04/08/21 D[facility]- [facility]  AMB Correspondence Received 06/06/21 DECISION NOTES [employer] [facility]  Insurance Card Received (Expired) 07/04/21   Insurance Card Received (Expired) 07/11/21 WRFM/AETNAMCR  Insurance Card Received (Expired) 07/11/21 WRFM/NEWMCR  Authorization to Release Protected Health Information Received 07/22/21 [facility]  AMB Correspondence Received 07/04/21 AETNA APPROVAL-ZYTIGA  Insurance Card Received (Expired) 07/29/21 NEW CARD [id number]  AETNA  Driver's License Not Received 09/24/21 did not bring with him  AMB Correspondence Received 10/07/21 CHEMO APPROVAL  AMB Correspondence Received 10/07/21 CHEMO APPROVAL  Authorization to Release Protected Health Information  10/15/21   AMB Correspondence Received 10/28/21 INSURANCE AUTHORIZATION FOR MEDICATION COVERAGE  AMB Patient Logs/Info Received 11/01/21   AMB Correspondence Received 11/01/21 AZ&ME APPROVAL LETTER  AMB Correspondence Received 11/26/21 INSURANCE FINAL NOTICE OF DENIAL  Insurance Card Received 07/02/22   AMB HH/NH/Hospice Received 08/06/22 CLIENT OCCURENCE REPORT [facility]  CE Attachment     AMB HH/NH/Hospice Received 08/26/22 CLIENT OCCURENCE REPORT  AMB HH/NH/Hospice Received 09/02/22 CLIENT COORDINATION NOTE REPORT [facility]  AMB HH/NH/Hospice Received 09/08/22 VERBAL ORDER [facility]  AMB HH/NH/Hospice Received 09/08/22 [facility]  AMB HH/NH/Hospice Received 09/17/22 HOSPICE POC [facility]  AMB HH/NH/Hospice Received 09/23/22 VERBAL ORDER [facility]  [facility]PASS[facility]  AMB HH/NH/Hospice Received 10/09/22 VERBAL ORDER [facility]  AMB Provider Completed Forms Received 10/01/22 VERBAL ORDER- [facility]  AMB HH/NH/Hospice Received 10/30/22 AGENCY TRANSFER SUMMARY [facility]  AMB HH/NH/Hospice Received 12/08/22 VERBAL ORDER [facility]  AMB HH/NH/Hospice Received 12/12/22 HOSPICE PLAN OF CARE [facility]  AMB HH/NH/Hospice Received 12/30/22 VERBAL ORDER [facility]  Insurance Card Received (Deleted) 07/22/13 humana  AMB Outside Consult Note  (Deleted) 11/08/14 CONSULTATION BAUER MD, B  AMB Outside Consult Note  (Deleted) 02/16/15 [facility]  AMB Outside Consult Note  (Deleted) 12/03/15 OFFICE NOTE [facility]  AMB Intake Forms/Questionnaires  (Deleted) 01/21/16   AMB Correspondence  (Deleted) 02/27/16 PROGRESS NOTE [facility]  HIM Release of Information Output  09/13/19 Requested records  HIM Release of Information Output  (Deleted) 12/01/19 Requested records  Patient Photo   Photo of Patient  Patient Photo   Photo of Patient  HIM Release of Information Output  (Deleted) 07/04/21 Requested records  HIM Release of Information Output  07/22/21 Requested records  HIM Release of Information Output  (Deleted) 10/15/21 Requested records  HIM Release of Information Output  (Deleted) 10/15/21 Requested records  Authorization to Release Protected Health Information  (Deleted)    Documents for the Encounter  AOB  (Assignment of Insurance Benefits) Not Received    E-signature AOB Signed 01/25/23   MEDICARE RIGHTS Not Received    E-signature Medicare Rights Received 01/25/23   Annual Exam - E-Signature PCP     ED Patient Billing Extract   ED PB Billing Extract  Photos   Lower abd wall fold with macerated areas  Photos     Photos   Abd wall  Photos   Back and  extremities  Photos   Buttocks.. back and thighs  Photos     EKG Received 01/26/23      Admission Information   Current Information  Attending Provider Admitting Provider Admission Type Admission Status  [name], MD Rama, Maryruth Bun, MD Emergency Confirmed Admission        Admission Date/Time Discharge Date Hospital Service Auth/Cert Status  01/25/23  1533  Acute Care Incomplete        Hospital Area Unit Room/Bed   [facility] AP-DEPT 300 A317/A317-01             Admission  Complaint  weakness    Hospital Account  Name Acct ID Class Status Primary Coverage  Safari, [place] [id number] Inpatient Open UNITED HEALTHCARE MEDICARE - [facility] MEDICARE        Guarantor Account (for Hospital Account [id number])  Name Relation to Pt Service Area Active? Acct Type  Maurine Cane Self CHSA Yes Personal/Family  Address Phone    [address] [place], [place] [address] [phone](H)          Coverage Information (for Hospital Account [id number])  F/O Payor/Plan Precert #  [facility] MEDICARE/UHC MEDICARE   Subscriber Subscriber #  Mang, Tailor [id number]  Address Phone  [address] [place], [place] [address] [phone]         Care Everywhere ID:  [id number]

## 2023-01-27 NOTE — Plan of Care (Signed)
  Problem: Safety: Goal: Ability to remain free from injury will improve Outcome: Progressing   

## 2023-01-28 DIAGNOSIS — A419 Sepsis, unspecified organism: Secondary | ICD-10-CM | POA: Diagnosis not present

## 2023-01-28 LAB — GLUCOSE, CAPILLARY
Glucose-Capillary: 84 mg/dL (ref 70–99)
Glucose-Capillary: 64 mg/dL — ABNORMAL LOW (ref 70–99)

## 2023-01-28 MED ORDER — POLYETHYLENE GLYCOL 3350 17 G PO PACK: 17.0000 g | PACK | Freq: Every day | ORAL | Status: DC | PRN

## 2023-01-28 MED ORDER — ZINC OXIDE 40 % EX OINT: TOPICAL_OINTMENT | Freq: Two times a day (BID) | CUTANEOUS | Status: DC

## 2023-01-28 NOTE — Care Management Important Message (Signed)
Important Message  Patient Details  Name: Andrew [name] MRN: [id number] Date of Birth: September 08, 1945   Medicare Important Message Given:  Yes     Corey Harold 01/28/2023, 2:33 PM

## 2023-01-28 NOTE — Consult Note (Signed)
   [facility] Inpatient Consult   01/28/2023  Andrew [name] [birth year] [id number]  Primary Care Provider:  Dr. Broadus [facility])  Patient is currently active with Care Management for chronic disease management services.  Patient has been engaged by a Charity fundraiser.  Our community based plan of care has focused on disease management and community resource support however upon this discharge pt will transition over the residential hospice. Hospital nurse liaison  will alert the involved nurse care coordinator that pt is now under hospice care.  Of note, Care Management services does not replace or interfere with any services that are needed or arranged by inpatient [facility] care management team.   For additional questions or referrals please contact:  Elliot Cousin, RN, [facility] Liaison [place]   Population Health Office Hours MTWF  8:00 am-6:00 pm Off on Thursday [phone] mobile [phone] [Office toll free line] Office Hours are M-F 8:30 - 5 pm 24 hour nurse advise line [phone] Concierge  [email or web address]

## 2023-01-28 NOTE — Discharge Summary (Signed)
Physician Discharge Summary   Patient: Andrew [name] MRN: [id number] DOB: 1946-01-09  Admit date:     01/25/2023  Discharge date: 01/28/23  Discharge Physician: Tyrone Nine   PCP: [name], MD   Recommendations at discharge:  Continue care at [facility].  Discharge Diagnoses: Principal Problem:   Sepsis (HCC) Active Problems:   Type 2 diabetes mellitus without complications (HCC)   Benign prostatic hyperplasia with urinary obstruction   Essential hypertension   Hyperlipidemia associated with type 2 diabetes mellitus (HCC)   Prostate cancer (HCC)   Aortic atherosclerosis (HCC)   Protein calorie malnutrition [facility])  Hospital Course: 77 y.o. male with a PMH of metastatic castration refractory prostate cancer, followed by Dr. Ellin Saba, last seen 08/28/2022 who was noted to be tolerating chemotherapy poorly at that time and he was recommended to pursue hospice care admitted on 01/25/2023 with sepsis from presumed skin and urinary source. Antibiotics given for S. agalactiae bacteremia and polyclonal UTI with improvement/stability. Through continued goals of care discussions, there patient will be discharged to residential hospice on 7/31.   Assessment and Plan: 1) strep bacteremia and sepsis---- POA -Suspect this is skin related--- please see photos in epic -Convert vancomycin >> PICC line is not in goals of care. It is not clear that continued antibiotics are either, though family has reported that they would like to "treat the treatable." We will discharge on keflex pending hospice conversations with family.  -No echo, or TEE ordered per GOC conversations.   2) E. coli and Klebsiella UTI: Culture reincubated so susceptibilities pending at this time. Will continue with keflex as above.   3) Extensive skin lesions/cellulitis:  WOC Nurse Consult Note: Reason for Consult: back, buttocks and thigh wounds  Wound type: partial thickness skin loss likely due to  moisture  ICD-10 CM Codes for Irritant Dermatitis L24A2 - Due to fecal, urinary or dual incontinence   Pressure Injury POA: Yes, stage 2 buttocks  Measurement: widespread across back, hips, buttocks, thighs  Wound bed: partial thickness skin loss, pink moist  Drainage (amount, consistency, odor) minimal serosanguinous  Periwound: intact  Dressing procedure/placement/frequency:  Clean back, buttocks, hips and thighs with Vashe wound care cleanser [facility] [place] [phone]) , dry and apply a thin layer of Desitin 2 times daily.  DO NOT ATTEMPT TO SCRUB DESITIN OFF UNLESS SOILED WITH STOOL. If becomes soiled with stool gently wipe stool off Desitin and do not scrub.   4)FTT/anorexia: Consider megace (continued) vs. discontinuing at hospice.    5)HTN--- hold off on lisinopril hydrochlorothiazide due to poor oral intake and risk of AKI   6)Social/Ethics--- hospice patient -Currently DNR/DNI Discussed with Patient's brother [name] and sister-in-law [name] at bedside, questions answered -Treat the treatable without being too aggressive -continue pain control per hospice MD. Pt has taken oxycodone 5mg  twice in past 48 hours.    7)DM2-prior A1c 5.9 reflecting excellent diabetic control PTA: No ongoing Tx recommended.     Consultants: None Procedures performed: None  Disposition:  Residential Hospice Diet recommendation: Dysphagia 3 or as per comfort. DISCHARGE MEDICATION: Allergies as of 01/28/2023   No Known Allergies      Medication List     STOP taking these medications    alfuzosin 10 MG 24 hr tablet Commonly known as: UROXATRAL   aspirin 81 MG tablet   lisinopril-hydrochlorothiazide 20-25 MG tablet Commonly known as: ZESTORETIC       TAKE these medications    acetaminophen 650 MG CR tablet Commonly known  as: TYLENOL Take 1,300 mg by mouth every 8 (eight) hours as needed for pain.   cephALEXin 500 MG capsule Commonly known as: KEFLEX Take 500 mg by mouth 2 (two) times  daily.   cholecalciferol 1000 units tablet Commonly known as: VITAMIN D Take 1,000 Units by mouth daily.   escitalopram 10 MG tablet Commonly known as: LEXAPRO Take 1 tablet (10 mg total) by mouth daily.   fluconazole 100 MG tablet Commonly known as: DIFLUCAN SMARTSIG:1 Tablet(s) By Mouth   glucose blood test strip 1 each by Other route as needed for other. Use as instructed to test blood sugar daily.  One Touch Verio Test Strips DX: E11.65   liver oil-zinc oxide 40 % ointment Commonly known as: DESITIN Apply topically 2 (two) times daily.   LORazepam 2 MG/ML concentrated solution Commonly known as: ATIVAN SMARTSIG:0.5 Milligram(s) By Mouth Every 6 Hours PRN   megestrol 400 MG/10ML suspension Commonly known as: MEGACE Take 10 mLs (400 mg total) by mouth 2 (two) times daily.   Oxycodone HCl 10 MG Tabs Take 10 mg by mouth every 8 (eight) hours as needed.   polyethylene glycol 17 g packet Commonly known as: MIRALAX / GLYCOLAX Take 17 g by mouth daily as needed for mild constipation.        Follow-up Information     [name], MD Follow up.   Specialty: Family Medicine Contact information: [address] [place] [address] [phone]                Discharge Exam: Ceasar Mons Weights   01/25/23 1534  Weight: 75 kg  BP (!) 105/57 (BP Location: Left Arm)   Pulse 90   Temp 98 F (36.7 C) (Oral)   Resp 14   Ht 5\' 6"  (1.676 m)   Wt 75 kg   SpO2 (!) 87%   BMI 26.69 kg/m   Frail elderly male in no distress Clear, nonlabored MMM RRR Skin exam today similar to pictures included below             Condition at discharge: stable  The results of significant diagnostics from this hospitalization (including imaging, microbiology, ancillary and laboratory) are listed below for reference.   Imaging Studies: DG Chest Port 1 View  Result Date: 01/25/2023 CLINICAL DATA:  Sepsis, history of prostate cancer EXAM: PORTABLE CHEST 1 VIEW COMPARISON:   02/25/2022 FINDINGS: Single frontal view of the chest demonstrates a stable right chest wall port. The cardiac silhouette is stable. No acute airspace disease, effusion, or pneumothorax. No acute bony abnormalities. IMPRESSION: 1. Stable chest, no acute process. Electronically Signed   By: Sharlet Salina M.D.   On: 01/25/2023 16:51    Microbiology: Results for orders placed or performed during the hospital encounter of 01/25/23  Resp panel by RT-PCR (RSV, Flu A&B, Covid) Anterior Nasal Swab     Status: None   Collection Time: 01/25/23  3:48 PM   Specimen: Anterior Nasal Swab  Result Value Ref Range Status   SARS Coronavirus 2 by RT PCR NEGATIVE NEGATIVE Final    Comment: (NOTE) SARS-CoV-2 target nucleic acids are NOT DETECTED.  The SARS-CoV-2 RNA is generally detectable in upper respiratory specimens during the acute phase of infection. The lowest concentration of SARS-CoV-2 viral copies this assay can detect is 138 copies/mL. A negative result does not preclude SARS-Cov-2 infection and should not be used as the sole basis for treatment or other patient management decisions. A negative result may occur with  improper  specimen collection/handling, submission of specimen other than nasopharyngeal swab, presence of viral mutation(s) within the areas targeted by this assay, and inadequate number of viral copies(<138 copies/mL). A negative result must be combined with clinical observations, patient history, and epidemiological information. The expected result is Negative.  Fact Sheet for Patients:  BloggerCourse.com  Fact Sheet for Healthcare Providers:  SeriousBroker.it  This test is no t yet approved or cleared by the [place] FDA and  has been authorized for detection and/or diagnosis of SARS-CoV-2 by FDA under an Emergency Use Authorization (EUA). This EUA will remain  in effect (meaning this test can be used) for the duration of  the COVID-19 declaration under Section 564(b)(1) of the Act, 21 U.S.C.section 360bbb-3(b)(1), unless the authorization is terminated  or revoked sooner.       Influenza A by PCR NEGATIVE NEGATIVE Final   Influenza B by PCR NEGATIVE NEGATIVE Final    Comment: (NOTE) The Xpert Xpress SARS-CoV-2/FLU/RSV plus assay is intended as an aid in the diagnosis of influenza from Nasopharyngeal swab specimens and should not be used as a sole basis for treatment. Nasal washings and aspirates are unacceptable for Xpert Xpress SARS-CoV-2/FLU/RSV testing.  Fact Sheet for Patients: BloggerCourse.com  Fact Sheet for Healthcare Providers: SeriousBroker.it  This test is not yet approved or cleared by the [place] FDA and has been authorized for detection and/or diagnosis of SARS-CoV-2 by FDA under an Emergency Use Authorization (EUA). This EUA will remain in effect (meaning this test can be used) for the duration of the COVID-19 declaration under Section 564(b)(1) of the Act, 21 U.S.C. section 360bbb-3(b)(1), unless the authorization is terminated or revoked.     Resp Syncytial Virus by PCR NEGATIVE NEGATIVE Final    Comment: (NOTE) Fact Sheet for Patients: BloggerCourse.com  Fact Sheet for Healthcare Providers: SeriousBroker.it  This test is not yet approved or cleared by the [place] FDA and has been authorized for detection and/or diagnosis of SARS-CoV-2 by FDA under an Emergency Use Authorization (EUA). This EUA will remain in effect (meaning this test can be used) for the duration of the COVID-19 declaration under Section 564(b)(1) of the Act, 21 U.S.C. section 360bbb-3(b)(1), unless the authorization is terminated or revoked.  Performed at [facility], [address] [place], [place] [address]   Blood Culture (routine x 2)     Status: None (Preliminary result)   Collection  Time: 01/25/23  3:53 PM   Specimen: Porta Cath; Blood  Result Value Ref Range Status   Specimen Description PORTA CATH BOTTLES DRAWN AEROBIC AND ANAEROBIC  Final   Special Requests   Final    Blood Culture results may not be optimal due to an excessive volume of blood received in culture bottles   Culture   Final    NO GROWTH 3 DAYS Performed at [facility], [address], [place], [place] [address]    Report Status PENDING  Incomplete  Blood Culture (routine x 2)     Status: Abnormal (Preliminary result)   Collection Time: 01/25/23  4:33 PM   Specimen: Right Antecubital; Blood  Result Value Ref Range Status   Specimen Description   Final    RIGHT ANTECUBITAL BLOOD Performed at [facility], [address] [place], [place] [address]    Special Requests   Final    Blood Culture adequate volume Performed at [facility], [address] [place], [place] [address]    Culture  Setup Time   Final  GRAM POSITIVE COCCI IN CHAINS AEROBIC BOTTLE ONLY Gram Stain Report Called to,Read Back By and Verified With: MARY MILLS 01/26/23 0857 NMN CRITICAL RESULT CALLED TO, READ BACK BY AND VERIFIED WITH: PHARMD E. MADUEME [id number] @ 1529 FH    Culture (A)  Final    GROUP B STREP(S.AGALACTIAE)ISOLATED GRAM POSITIVE RODS CRITICAL RESULT CALLED TO, READ BACK BY AND VERIFIED WITH: PHARMD Lori P. [phone] [id number] fcp CULTURE REINCUBATED FOR BETTER GROWTH Performed at [facility], [address], [place], [place] [address]    Report Status PENDING  Incomplete   Organism ID, Bacteria GROUP B STREP(S.AGALACTIAE)ISOLATED  Final      Susceptibility   Group b strep(s.agalactiae)isolated - MIC*    CLINDAMYCIN <=0.25 SENSITIVE Sensitive     AMPICILLIN <=0.25 SENSITIVE Sensitive     ERYTHROMYCIN >=8 RESISTANT Resistant     VANCOMYCIN 0.5 SENSITIVE Sensitive     CEFTRIAXONE <=0.12 SENSITIVE Sensitive     LEVOFLOXACIN 1 SENSITIVE Sensitive     PENICILLIN <=0.06 SENSITIVE Sensitive     * GROUP B  STREP(S.AGALACTIAE)ISOLATED  Blood Culture ID Panel (Reflexed)     Status: Abnormal   Collection Time: 01/25/23  4:33 PM  Result Value Ref Range Status   Enterococcus faecalis NOT DETECTED NOT DETECTED Final   Enterococcus Faecium NOT DETECTED NOT DETECTED Final   Listeria monocytogenes NOT DETECTED NOT DETECTED Final   Staphylococcus species NOT DETECTED NOT DETECTED Final   Staphylococcus aureus (BCID) NOT DETECTED NOT DETECTED Final   Staphylococcus epidermidis NOT DETECTED NOT DETECTED Final   Staphylococcus lugdunensis NOT DETECTED NOT DETECTED Final   Streptococcus species DETECTED (A) NOT DETECTED Final    Comment: CRITICAL RESULT CALLED TO, READ BACK BY AND VERIFIED WITH: PHARMD E. MADUEME [id number] @ 1529 FH    Streptococcus agalactiae DETECTED (A) NOT DETECTED Final    Comment: CRITICAL RESULT CALLED TO, READ BACK BY AND VERIFIED WITH: PHARMD E. MADUEME [id number] @ 1529 FH    Streptococcus pneumoniae NOT DETECTED NOT DETECTED Final   Streptococcus pyogenes NOT DETECTED NOT DETECTED Final   A.calcoaceticus-baumannii NOT DETECTED NOT DETECTED Final   Bacteroides fragilis NOT DETECTED NOT DETECTED Final   Enterobacterales NOT DETECTED NOT DETECTED Final   Enterobacter cloacae complex NOT DETECTED NOT DETECTED Final   Escherichia coli NOT DETECTED NOT DETECTED Final   Klebsiella aerogenes NOT DETECTED NOT DETECTED Final   Klebsiella oxytoca NOT DETECTED NOT DETECTED Final   Klebsiella pneumoniae NOT DETECTED NOT DETECTED Final   Proteus species NOT DETECTED NOT DETECTED Final   Salmonella species NOT DETECTED NOT DETECTED Final   Serratia marcescens NOT DETECTED NOT DETECTED Final   Haemophilus influenzae NOT DETECTED NOT DETECTED Final   Neisseria meningitidis NOT DETECTED NOT DETECTED Final   Pseudomonas aeruginosa NOT DETECTED NOT DETECTED Final   Stenotrophomonas maltophilia NOT DETECTED NOT DETECTED Final   Candida albicans NOT DETECTED NOT DETECTED Final   Candida auris NOT  DETECTED NOT DETECTED Final   Candida glabrata NOT DETECTED NOT DETECTED Final   Candida krusei NOT DETECTED NOT DETECTED Final   Candida parapsilosis NOT DETECTED NOT DETECTED Final   Candida tropicalis NOT DETECTED NOT DETECTED Final   Cryptococcus neoformans/gattii NOT DETECTED NOT DETECTED Final    Comment: Performed at [facility], [address] [place], [place] [address]  Urine Culture     Status: Abnormal (Preliminary result)   Collection Time: 01/25/23  5:57 PM   Specimen: Urine, Random  Result Value Ref Range Status  Specimen Description   Final    URINE, RANDOM Performed at [facility], [address] [place], [place] [address]    Special Requests   Final    NONE Performed at [facility], [address], [place], [place] [address]    Culture (A)  Final    >=100,000 COLONIES/mL ESCHERICHIA COLI >=100,000 COLONIES/mL KLEBSIELLA PNEUMONIAE CULTURE REINCUBATED FOR BETTER GROWTH Performed at [facility], [address], [place], [place] [address]    Report Status PENDING  Incomplete    Labs: CBC: Recent Labs  Lab 01/25/23 1548 01/25/23 2236 01/26/23 0639 01/27/23 0418  WBC 15.5* 13.1* 12.2* 8.7  NEUTROABS 13.1*  --   --   --   HGB 10.1* 9.7* 8.4* 8.0*  HCT 30.8* 30.3* 26.0* 24.6*  MCV 93.9 96.8 95.2 95.3  PLT 244 203 171 157   Basic Metabolic Panel: Recent Labs  Lab 01/25/23 1548 01/25/23 2236 01/26/23 0409 01/27/23 0418  NA 134*  --  135 135  K 3.8  --  4.4 3.9  CL 102  --  103 105  CO2 17*  --  19* 23  GLUCOSE 129*  --  109* 79  BUN 41*  --  39* 40*  CREATININE 2.15* 2.01* 1.99* 1.70*  CALCIUM 8.2*  --  8.1* 7.9*  PHOS  --   --   --  2.4*   Liver Function Tests: Recent Labs  Lab 01/25/23 1548 01/27/23 0418  AST 24  --   ALT 20  --   ALKPHOS 78  --   BILITOT 1.0  --   PROT 6.5  --   ALBUMIN 2.1* 1.6*   CBG: Recent Labs  Lab 01/27/23 1114 01/27/23 1626 01/27/23 2107 01/28/23 0733 01/28/23 1102  GLUCAP 74 74 80 84  64*    Discharge time spent: greater than 30 minutes.  Signed: Tyrone Nine, MD [facility] 01/28/2023

## 2023-01-28 NOTE — TOC Transition Note (Addendum)
Transition of Care [facility]) - CM/SW Discharge Note   Patient Details  Name: Andrew [name] MRN: [id number] Date of Birth: Jun 28, 1946  Transition of Care [facility]) CM/SW Contact:  Villa Herb, LCSWA Phone Number: 01/28/2023, 1:27 PM   Clinical Narrative:    CSW updated by RN that hospice nurse has been at bedside and family has elected for transition to [facility] house, residential hospice. CSW spoke to [place] with [facility] who states that [facility] can accept after 2 today. CSW completed med necessity and sent to floor for RN. CSW placed pt on EMS transport list. CSW updated pts wife of EMS transport. TOC signing off.   Final next level of care: Hospice Medical Facility Barriers to Discharge: Barriers Resolved   Patient Goals and CMS Choice CMS Medicare.gov Compare Post Acute Care list provided to:: Patient Choice offered to / list presented to : Patient, Spouse  Discharge Placement                  Patient to be transferred to facility by: EMS      Discharge Plan and Services Additional resources added to the After Visit Summary for                                       Social Determinants of Health (SDOH) Interventions SDOH Screenings   Food Insecurity: No Food Insecurity (01/26/2023)  Housing: Low Risk  (01/26/2023)  Transportation Needs: No Transportation Needs (01/26/2023)  Utilities: Not At Risk (01/26/2023)  Alcohol Screen: Low Risk  (01/27/2022)  Depression (PHQ2-9): High Risk (08/19/2022)  Financial Resource Strain: Medium Risk (04/14/2022)  Physical Activity: Inactive (01/27/2022)  Social Connections: Moderately Integrated (01/27/2022)  Stress: No Stress Concern Present (03/07/2022)  Tobacco Use: High Risk (01/25/2023)     Readmission Risk Interventions     No data to display

## 2023-01-30 ENCOUNTER — Encounter: Payer: Self-pay | Admitting: Hematology

## 2023-02-02 ENCOUNTER — Other Ambulatory Visit: Payer: Self-pay | Admitting: Family Medicine

## 2023-02-02 MED ORDER — CEFPROZIL 500 MG PO TABS: 500.0000 mg | ORAL_TABLET | Freq: Two times a day (BID) | ORAL | 0 refills | Status: DC

## 2023-02-02 NOTE — Progress Notes (Signed)
Your urine culture shows the presence of a germ that is resistant to the current antibiotic you are taking. Please discontinue that medication and take the new one I have sent to your pharmacy.  Best Regards,  , M.D.  

## 2023-02-03 ENCOUNTER — Ambulatory Visit: Payer: Self-pay | Admitting: *Deleted

## 2023-02-03 NOTE — Patient Instructions (Signed)
Visit Information  Thank you for taking time to visit with me today. Please don't hesitate to contact me if I can be of assistance to you.   Following are the goals we discussed today:   Goals Addressed               This Visit's Progress     Patient Stated     Manage Diabetes [facility]) (pt-stated)   Not on track     transition to [facility], residential hospice         Our next appointment is  n/a  on n/a at n/a  Please call the care guide team at [phone] if you need to cancel or reschedule your appointment.   If you are experiencing a Mental Health or Behavioral Health Crisis or need someone to talk to, please call 1-800-273-TALK (toll free, 24 hour hotline) call the [facility]: [phone]   transition to [facility] house, residential hospice  The patient has been provided with contact information for the care management team and has been advised to call with any health related questions or concerns.     L. Noelle Penner, RN, BSN, CCM [facility] Care Management Community Coordinator Office number [phone]

## 2023-02-03 NOTE — Patient Outreach (Signed)
  Care Coordination   Case closure  Visit Note   02/03/2023 Name: Andrew [name] MRN: [id number] DOB: [birth year]  Andrew [name] is a 77 y.o. year old male who sees [name], MD for primary care. I  was notified by [facility] hospital liaison that patient transition to [facility] house, residential hospice  What matters to the patients health and wellness today?  transition to [facility], residential hospice     Goals Addressed               This Visit's Progress     Patient Stated     Manage Diabetes [facility]) (pt-stated)   Not on track     transition to [employer], residential hospice         SDOH assessments and interventions completed:  No     Care Coordination Interventions:  No, not indicated   Follow up plan: No further intervention required.   Encounter Outcome:  Pt. Visit Completed    L. Noelle Penner, RN, BSN, CCM [facility] Care Management Community Coordinator Office number [phone]

## 2023-03-01 DEATH — deceased
